# Patient Record
Sex: Female | Born: 1980
Health system: Southern US, Community
[De-identification: ages and names within clinical notes are randomized; demographics above are authoritative.]

## PROBLEM LIST (undated history)

## (undated) DIAGNOSIS — Q8789 Other specified congenital malformation syndromes, not elsewhere classified: Secondary | ICD-10-CM

## (undated) DIAGNOSIS — F1010 Alcohol abuse, uncomplicated: Secondary | ICD-10-CM

## (undated) DIAGNOSIS — F319 Bipolar disorder, unspecified: Secondary | ICD-10-CM

## (undated) DIAGNOSIS — F4310 Post-traumatic stress disorder, unspecified: Secondary | ICD-10-CM

## (undated) DIAGNOSIS — F419 Anxiety disorder, unspecified: Secondary | ICD-10-CM

## (undated) DIAGNOSIS — J341 Cyst and mucocele of nose and nasal sinus: Secondary | ICD-10-CM

## (undated) DIAGNOSIS — M797 Fibromyalgia: Secondary | ICD-10-CM

## (undated) DIAGNOSIS — F32A Depression, unspecified: Secondary | ICD-10-CM

## (undated) DIAGNOSIS — E739 Lactose intolerance, unspecified: Secondary | ICD-10-CM

## (undated) DIAGNOSIS — IMO0001 Reserved for inherently not codable concepts without codable children: Secondary | ICD-10-CM

## (undated) DIAGNOSIS — E785 Hyperlipidemia, unspecified: Secondary | ICD-10-CM

## (undated) DIAGNOSIS — M722 Plantar fascial fibromatosis: Secondary | ICD-10-CM

## (undated) DIAGNOSIS — E669 Obesity, unspecified: Secondary | ICD-10-CM

## (undated) DIAGNOSIS — N63 Unspecified lump in unspecified breast: Secondary | ICD-10-CM

## (undated) DIAGNOSIS — M2550 Pain in unspecified joint: Secondary | ICD-10-CM

## (undated) DIAGNOSIS — K219 Gastro-esophageal reflux disease without esophagitis: Secondary | ICD-10-CM

## (undated) DIAGNOSIS — B373 Candidiasis of vulva and vagina: Secondary | ICD-10-CM

## (undated) DIAGNOSIS — A498 Other bacterial infections of unspecified site: Secondary | ICD-10-CM

## (undated) DIAGNOSIS — C4491 Basal cell carcinoma of skin, unspecified: Secondary | ICD-10-CM

## (undated) DIAGNOSIS — R7303 Prediabetes: Secondary | ICD-10-CM

## (undated) DIAGNOSIS — R600 Localized edema: Secondary | ICD-10-CM

## (undated) DIAGNOSIS — D649 Anemia, unspecified: Secondary | ICD-10-CM

## (undated) DIAGNOSIS — F329 Major depressive disorder, single episode, unspecified: Secondary | ICD-10-CM

## (undated) DIAGNOSIS — M549 Dorsalgia, unspecified: Secondary | ICD-10-CM

## (undated) DIAGNOSIS — B3731 Acute candidiasis of vulva and vagina: Secondary | ICD-10-CM

## (undated) DIAGNOSIS — L03019 Cellulitis of unspecified finger: Secondary | ICD-10-CM

## (undated) DIAGNOSIS — IMO0002 Reserved for concepts with insufficient information to code with codable children: Secondary | ICD-10-CM

## (undated) DIAGNOSIS — D219 Benign neoplasm of connective and other soft tissue, unspecified: Secondary | ICD-10-CM

## (undated) DIAGNOSIS — Z9889 Other specified postprocedural states: Secondary | ICD-10-CM

## (undated) DIAGNOSIS — K829 Disease of gallbladder, unspecified: Secondary | ICD-10-CM

## (undated) HISTORY — DX: Alcohol abuse, uncomplicated: F10.10

## (undated) HISTORY — DX: Cyst and mucocele of nose and nasal sinus: J34.1

## (undated) HISTORY — DX: Pain in unspecified joint: M25.50

## (undated) HISTORY — DX: Other specified congenital malformation syndromes, not elsewhere classified: Q87.89

## (undated) HISTORY — DX: Lactose intolerance, unspecified: E73.9

## (undated) HISTORY — DX: Benign neoplasm of connective and other soft tissue, unspecified: D21.9

## (undated) HISTORY — DX: Acute candidiasis of vulva and vagina: B37.31

## (undated) HISTORY — DX: Post-traumatic stress disorder, unspecified: F43.10

## (undated) HISTORY — PX: NASAL SINUS SURGERY: SHX719

## (undated) HISTORY — DX: Cellulitis of unspecified finger: L03.019

## (undated) HISTORY — DX: Hyperlipidemia, unspecified: E78.5

## (undated) HISTORY — DX: Other bacterial infections of unspecified site: A49.8

## (undated) HISTORY — DX: Disease of gallbladder, unspecified: K82.9

## (undated) HISTORY — DX: Other specified postprocedural states: Z98.89

## (undated) HISTORY — DX: Bipolar disorder, unspecified: F31.9

## (undated) HISTORY — DX: Unspecified lump in unspecified breast: N63.0

## (undated) HISTORY — DX: Candidiasis of vulva and vagina: B37.3

## (undated) HISTORY — DX: Depression, unspecified: F32.A

## (undated) HISTORY — PX: CHOLECYSTECTOMY: SHX55

## (undated) HISTORY — DX: Reserved for inherently not codable concepts without codable children: IMO0001

## (undated) HISTORY — DX: Localized edema: R60.0

## (undated) HISTORY — DX: Obesity, unspecified: E66.9

## (undated) HISTORY — DX: Basal cell carcinoma of skin, unspecified: C44.91

## (undated) HISTORY — DX: Dorsalgia, unspecified: M54.9

## (undated) HISTORY — DX: Plantar fascial fibromatosis: M72.2

## (undated) HISTORY — PX: LEFT OOPHORECTOMY: SHX1961

---
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
## 2001-10-07 ENCOUNTER — Encounter: Admission: RE | Admit: 2001-10-07 | Discharge: 2001-10-07 | Payer: Self-pay | Admitting: Internal Medicine

## 2001-10-21 ENCOUNTER — Encounter: Admission: RE | Admit: 2001-10-21 | Discharge: 2001-10-21 | Payer: Self-pay | Admitting: *Deleted

## 2001-11-27 ENCOUNTER — Encounter: Admission: RE | Admit: 2001-11-27 | Discharge: 2001-11-27 | Payer: Self-pay | Admitting: Obstetrics and Gynecology

## 2001-12-03 ENCOUNTER — Ambulatory Visit (HOSPITAL_COMMUNITY): Admission: RE | Admit: 2001-12-03 | Discharge: 2001-12-03 | Payer: Self-pay | Admitting: Obstetrics and Gynecology

## 2002-02-11 ENCOUNTER — Encounter: Admission: RE | Admit: 2002-02-11 | Discharge: 2002-02-11 | Payer: Self-pay | Admitting: Internal Medicine

## 2003-02-04 ENCOUNTER — Encounter: Admission: RE | Admit: 2003-02-04 | Discharge: 2003-02-04 | Payer: Self-pay | Admitting: Internal Medicine

## 2003-02-04 ENCOUNTER — Encounter (INDEPENDENT_AMBULATORY_CARE_PROVIDER_SITE_OTHER): Payer: Self-pay | Admitting: *Deleted

## 2003-03-04 ENCOUNTER — Encounter: Admission: RE | Admit: 2003-03-04 | Discharge: 2003-03-04 | Payer: Self-pay | Admitting: Internal Medicine

## 2003-03-15 ENCOUNTER — Encounter: Admission: RE | Admit: 2003-03-15 | Discharge: 2003-03-15 | Payer: Self-pay | Admitting: Internal Medicine

## 2003-09-23 ENCOUNTER — Emergency Department (HOSPITAL_COMMUNITY): Admission: EM | Admit: 2003-09-23 | Discharge: 2003-09-23 | Payer: Self-pay | Admitting: Emergency Medicine

## 2003-11-25 ENCOUNTER — Emergency Department (HOSPITAL_COMMUNITY): Admission: EM | Admit: 2003-11-25 | Discharge: 2003-11-25 | Payer: Self-pay | Admitting: Family Medicine

## 2004-02-22 ENCOUNTER — Ambulatory Visit: Payer: Self-pay | Admitting: Internal Medicine

## 2004-08-29 ENCOUNTER — Ambulatory Visit: Payer: Self-pay | Admitting: Internal Medicine

## 2004-08-29 ENCOUNTER — Encounter (INDEPENDENT_AMBULATORY_CARE_PROVIDER_SITE_OTHER): Payer: Self-pay | Admitting: Internal Medicine

## 2004-08-29 LAB — CONVERTED CEMR LAB: Pap Smear: NORMAL

## 2004-09-19 ENCOUNTER — Ambulatory Visit: Payer: Self-pay | Admitting: Internal Medicine

## 2004-10-10 ENCOUNTER — Ambulatory Visit: Payer: Self-pay | Admitting: Internal Medicine

## 2005-02-28 ENCOUNTER — Emergency Department (HOSPITAL_COMMUNITY): Admission: EM | Admit: 2005-02-28 | Discharge: 2005-02-28 | Payer: Self-pay | Admitting: Emergency Medicine

## 2005-03-08 ENCOUNTER — Ambulatory Visit: Payer: Self-pay | Admitting: Internal Medicine

## 2005-05-31 ENCOUNTER — Ambulatory Visit: Payer: Self-pay | Admitting: Internal Medicine

## 2005-06-07 ENCOUNTER — Ambulatory Visit (HOSPITAL_COMMUNITY): Admission: RE | Admit: 2005-06-07 | Discharge: 2005-06-07 | Payer: Self-pay | Admitting: Internal Medicine

## 2005-12-20 ENCOUNTER — Ambulatory Visit: Payer: Self-pay | Admitting: Internal Medicine

## 2006-03-01 ENCOUNTER — Encounter (INDEPENDENT_AMBULATORY_CARE_PROVIDER_SITE_OTHER): Payer: Self-pay | Admitting: Internal Medicine

## 2006-03-01 DIAGNOSIS — Z9889 Other specified postprocedural states: Secondary | ICD-10-CM | POA: Insufficient documentation

## 2006-03-01 DIAGNOSIS — Q8789 Other specified congenital malformation syndromes, not elsewhere classified: Secondary | ICD-10-CM | POA: Insufficient documentation

## 2006-03-01 HISTORY — DX: Other specified postprocedural states: Z98.89

## 2006-03-04 ENCOUNTER — Ambulatory Visit: Payer: Self-pay | Admitting: Internal Medicine

## 2006-03-04 DIAGNOSIS — Z9089 Acquired absence of other organs: Secondary | ICD-10-CM | POA: Insufficient documentation

## 2006-03-04 DIAGNOSIS — K219 Gastro-esophageal reflux disease without esophagitis: Secondary | ICD-10-CM | POA: Insufficient documentation

## 2006-07-24 ENCOUNTER — Ambulatory Visit: Payer: Self-pay | Admitting: Internal Medicine

## 2006-07-24 ENCOUNTER — Encounter (INDEPENDENT_AMBULATORY_CARE_PROVIDER_SITE_OTHER): Payer: Self-pay | Admitting: Internal Medicine

## 2006-07-24 DIAGNOSIS — D4959 Neoplasm of unspecified behavior of other genitourinary organ: Secondary | ICD-10-CM | POA: Insufficient documentation

## 2006-07-24 DIAGNOSIS — J309 Allergic rhinitis, unspecified: Secondary | ICD-10-CM | POA: Insufficient documentation

## 2006-07-25 ENCOUNTER — Encounter (INDEPENDENT_AMBULATORY_CARE_PROVIDER_SITE_OTHER): Payer: Self-pay | Admitting: Internal Medicine

## 2006-07-25 LAB — CONVERTED CEMR LAB
Chlamydia, DNA Probe: NEGATIVE
GC Probe Amp, Genital: NEGATIVE

## 2006-08-07 LAB — CONVERTED CEMR LAB

## 2006-09-15 ENCOUNTER — Emergency Department (HOSPITAL_COMMUNITY): Admission: EM | Admit: 2006-09-15 | Discharge: 2006-09-15 | Payer: Self-pay | Admitting: Emergency Medicine

## 2006-09-18 ENCOUNTER — Encounter (INDEPENDENT_AMBULATORY_CARE_PROVIDER_SITE_OTHER): Payer: Self-pay | Admitting: Internal Medicine

## 2006-09-18 ENCOUNTER — Ambulatory Visit: Payer: Self-pay | Admitting: *Deleted

## 2006-10-02 ENCOUNTER — Ambulatory Visit: Payer: Self-pay | Admitting: Infectious Disease

## 2006-10-02 ENCOUNTER — Encounter (INDEPENDENT_AMBULATORY_CARE_PROVIDER_SITE_OTHER): Payer: Self-pay | Admitting: *Deleted

## 2006-10-02 DIAGNOSIS — N760 Acute vaginitis: Secondary | ICD-10-CM | POA: Insufficient documentation

## 2006-10-03 LAB — CONVERTED CEMR LAB
Candida species: POSITIVE — AB
Gardnerella vaginalis: NEGATIVE
Trichomonal Vaginitis: NEGATIVE

## 2007-03-17 ENCOUNTER — Ambulatory Visit: Payer: Self-pay | Admitting: Internal Medicine

## 2007-03-17 LAB — CONVERTED CEMR LAB
Bilirubin Urine: NEGATIVE
Blood in Urine, dipstick: NEGATIVE
Glucose, Urine, Semiquant: NEGATIVE
Ketones, urine, test strip: NEGATIVE
Nitrite: NEGATIVE
Protein, U semiquant: NEGATIVE
Specific Gravity, Urine: 1.01
Urobilinogen, UA: 0.2
WBC Urine, dipstick: NEGATIVE
pH: 7

## 2007-03-18 ENCOUNTER — Encounter (INDEPENDENT_AMBULATORY_CARE_PROVIDER_SITE_OTHER): Payer: Self-pay | Admitting: Internal Medicine

## 2007-03-18 LAB — CONVERTED CEMR LAB
Beta hcg, urine, semiquantitative: NEGATIVE
Chlamydia, Swab/Urine, PCR: NEGATIVE
GC Probe Amp, Urine: NEGATIVE

## 2007-03-19 LAB — CONVERTED CEMR LAB
Candida species: POSITIVE — AB
Gardnerella vaginalis: POSITIVE — AB
Trichomonal Vaginitis: NEGATIVE

## 2007-06-02 ENCOUNTER — Ambulatory Visit: Payer: Self-pay | Admitting: Internal Medicine

## 2007-06-04 ENCOUNTER — Telehealth: Payer: Self-pay | Admitting: *Deleted

## 2007-06-05 ENCOUNTER — Encounter: Admission: RE | Admit: 2007-06-05 | Discharge: 2007-06-05 | Payer: Self-pay | Admitting: Infectious Disease

## 2007-06-05 ENCOUNTER — Ambulatory Visit: Payer: Self-pay | Admitting: Infectious Diseases

## 2007-06-05 ENCOUNTER — Telehealth: Payer: Self-pay | Admitting: *Deleted

## 2007-06-05 ENCOUNTER — Observation Stay (HOSPITAL_COMMUNITY): Admission: AD | Admit: 2007-06-05 | Discharge: 2007-06-06 | Payer: Self-pay | Admitting: Infectious Diseases

## 2007-06-05 ENCOUNTER — Ambulatory Visit: Payer: Self-pay | Admitting: Infectious Disease

## 2007-07-23 ENCOUNTER — Encounter (INDEPENDENT_AMBULATORY_CARE_PROVIDER_SITE_OTHER): Payer: Self-pay | Admitting: *Deleted

## 2007-07-23 ENCOUNTER — Ambulatory Visit: Payer: Self-pay | Admitting: *Deleted

## 2007-07-23 DIAGNOSIS — B0229 Other postherpetic nervous system involvement: Secondary | ICD-10-CM | POA: Insufficient documentation

## 2007-07-30 ENCOUNTER — Encounter (INDEPENDENT_AMBULATORY_CARE_PROVIDER_SITE_OTHER): Payer: Self-pay | Admitting: *Deleted

## 2007-07-30 ENCOUNTER — Ambulatory Visit: Payer: Self-pay | Admitting: Internal Medicine

## 2007-07-30 LAB — CONVERTED CEMR LAB

## 2007-07-31 LAB — CONVERTED CEMR LAB
Candida species: POSITIVE — AB
Gardnerella vaginalis: POSITIVE — AB
Trichomonal Vaginitis: NEGATIVE

## 2007-11-27 ENCOUNTER — Telehealth: Payer: Self-pay | Admitting: Infectious Disease

## 2008-01-20 ENCOUNTER — Ambulatory Visit: Payer: Self-pay | Admitting: Internal Medicine

## 2008-01-20 ENCOUNTER — Encounter (INDEPENDENT_AMBULATORY_CARE_PROVIDER_SITE_OTHER): Payer: Self-pay | Admitting: Internal Medicine

## 2008-01-20 LAB — CONVERTED CEMR LAB

## 2008-01-21 LAB — CONVERTED CEMR LAB
Candida species: POSITIVE — AB
Chlamydia, DNA Probe: NEGATIVE
GC Probe Amp, Genital: NEGATIVE
Gardnerella vaginalis: POSITIVE — AB
Trichomonal Vaginitis: NEGATIVE

## 2008-01-23 ENCOUNTER — Ambulatory Visit: Payer: Self-pay | Admitting: Internal Medicine

## 2008-01-23 ENCOUNTER — Encounter (INDEPENDENT_AMBULATORY_CARE_PROVIDER_SITE_OTHER): Payer: Self-pay | Admitting: Internal Medicine

## 2008-01-23 LAB — CONVERTED CEMR LAB
Bilirubin Urine: NEGATIVE
Bilirubin Urine: NEGATIVE
Blood in Urine, dipstick: NEGATIVE
Glucose, Urine, Semiquant: NEGATIVE
Hemoglobin, Urine: NEGATIVE
Leukocytes, UA: NEGATIVE
Nitrite: NEGATIVE
Nitrite: NEGATIVE
Protein, U semiquant: 30
Protein, ur: NEGATIVE mg/dL
Specific Gravity, Urine: >=1.03
Specific Gravity, Urine: 1.025 (ref 1.005–1.03)
Urine Glucose: NEGATIVE mg/dL
Urobilinogen, UA: 1
Urobilinogen, UA: 1 (ref 0.0–1.0)
pH: 6
pH: 6.5 (ref 5.0–8.0)

## 2008-01-24 ENCOUNTER — Encounter (INDEPENDENT_AMBULATORY_CARE_PROVIDER_SITE_OTHER): Payer: Self-pay | Admitting: Internal Medicine

## 2008-02-25 ENCOUNTER — Ambulatory Visit: Payer: Self-pay | Admitting: Internal Medicine

## 2008-02-25 ENCOUNTER — Encounter: Payer: Self-pay | Admitting: Internal Medicine

## 2008-02-25 LAB — CONVERTED CEMR LAB
ALT: 13 units/L (ref 0–35)
AST: 15 units/L (ref 0–37)
Albumin: 3.8 g/dL (ref 3.5–5.2)
Alkaline Phosphatase: 39 units/L (ref 39–117)
BUN: 12 mg/dL (ref 6–23)
Basophils Absolute: 0 10*3/uL (ref 0.0–0.1)
Basophils Relative: 0 % (ref 0–1)
CO2: 24 meq/L (ref 19–32)
Calcium: 8.8 mg/dL (ref 8.4–10.5)
Chloride: 105 meq/L (ref 96–112)
Creatinine, Ser: 0.73 mg/dL (ref 0.40–1.20)
Eosinophils Absolute: 0.1 10*3/uL (ref 0.0–0.7)
Eosinophils Relative: 1 % (ref 0–5)
Glucose, Bld: 95 mg/dL (ref 70–99)
HCT: 39.8 % (ref 36.0–46.0)
Hemoglobin: 12.9 g/dL (ref 12.0–15.0)
Hgb A1c MFr Bld: 5.7 %
Lymphocytes Relative: 48 % — ABNORMAL HIGH (ref 12–46)
Lymphs Abs: 2.2 10*3/uL (ref 0.7–4.0)
MCHC: 32.4 g/dL (ref 30.0–36.0)
MCV: 85.8 fL (ref 78.0–100.0)
Monocytes Absolute: 0.5 10*3/uL (ref 0.1–1.0)
Monocytes Relative: 11 % (ref 3–12)
Neutro Abs: 1.8 10*3/uL (ref 1.7–7.7)
Neutrophils Relative %: 40 % — ABNORMAL LOW (ref 43–77)
Platelets: 203 10*3/uL (ref 150–400)
Potassium: 3.7 meq/L (ref 3.5–5.3)
RBC: 4.64 M/uL (ref 3.87–5.11)
RDW: 14.2 % (ref 11.5–15.5)
Sodium: 139 meq/L (ref 135–145)
Total Bilirubin: 0.4 mg/dL (ref 0.3–1.2)
Total Protein: 6.6 g/dL (ref 6.0–8.3)
WBC: 4.5 10*3/uL (ref 4.0–10.5)

## 2008-02-26 ENCOUNTER — Encounter: Payer: Self-pay | Admitting: Internal Medicine

## 2008-02-26 DIAGNOSIS — M5126 Other intervertebral disc displacement, lumbar region: Secondary | ICD-10-CM | POA: Insufficient documentation

## 2008-02-26 LAB — CONVERTED CEMR LAB
Candida species: POSITIVE — AB
Gardnerella vaginalis: NEGATIVE
Trichomonal Vaginitis: NEGATIVE

## 2008-04-02 ENCOUNTER — Encounter (INDEPENDENT_AMBULATORY_CARE_PROVIDER_SITE_OTHER): Payer: Self-pay | Admitting: Internal Medicine

## 2008-04-02 ENCOUNTER — Ambulatory Visit: Payer: Self-pay | Admitting: Internal Medicine

## 2008-04-02 LAB — CONVERTED CEMR LAB
Candida species: NEGATIVE
Chlamydia, DNA Probe: NEGATIVE
GC Probe Amp, Genital: NEGATIVE
Gardnerella vaginalis: NEGATIVE
Hemoglobin, Urine: NEGATIVE
Ketones, ur: NEGATIVE mg/dL
Leukocytes, UA: NEGATIVE
Nitrite: NEGATIVE
Protein, ur: NEGATIVE mg/dL
Specific Gravity, Urine: 1.021 (ref 1.005–1.03)
Trichomonal Vaginitis: NEGATIVE
Urine Glucose: NEGATIVE mg/dL
Urobilinogen, UA: 1 (ref 0.0–1.0)
pH: 6.5 (ref 5.0–8.0)

## 2008-04-06 ENCOUNTER — Ambulatory Visit (HOSPITAL_COMMUNITY): Admission: RE | Admit: 2008-04-06 | Discharge: 2008-04-06 | Payer: Self-pay | Admitting: Internal Medicine

## 2008-04-08 ENCOUNTER — Ambulatory Visit: Payer: Self-pay | Admitting: Obstetrics & Gynecology

## 2008-04-09 ENCOUNTER — Encounter (INDEPENDENT_AMBULATORY_CARE_PROVIDER_SITE_OTHER): Payer: Self-pay | Admitting: Internal Medicine

## 2008-05-05 ENCOUNTER — Ambulatory Visit: Payer: Self-pay | Admitting: Internal Medicine

## 2008-05-18 ENCOUNTER — Ambulatory Visit (HOSPITAL_COMMUNITY): Admission: RE | Admit: 2008-05-18 | Discharge: 2008-05-18 | Payer: Self-pay | Admitting: Family Medicine

## 2008-05-31 ENCOUNTER — Encounter (INDEPENDENT_AMBULATORY_CARE_PROVIDER_SITE_OTHER): Payer: Self-pay | Admitting: Internal Medicine

## 2008-06-02 ENCOUNTER — Ambulatory Visit: Payer: Self-pay | Admitting: Obstetrics and Gynecology

## 2008-06-09 ENCOUNTER — Ambulatory Visit: Payer: Self-pay | Admitting: Internal Medicine

## 2008-07-12 ENCOUNTER — Ambulatory Visit: Payer: Self-pay | Admitting: Internal Medicine

## 2008-07-12 ENCOUNTER — Encounter: Admission: RE | Admit: 2008-07-12 | Discharge: 2008-07-12 | Payer: Self-pay

## 2008-07-12 DIAGNOSIS — N644 Mastodynia: Secondary | ICD-10-CM | POA: Insufficient documentation

## 2008-07-12 LAB — CONVERTED CEMR LAB: Beta hcg, urine, semiquantitative: NEGATIVE

## 2008-07-27 ENCOUNTER — Ambulatory Visit: Payer: Self-pay | Admitting: Internal Medicine

## 2008-08-30 ENCOUNTER — Encounter (INDEPENDENT_AMBULATORY_CARE_PROVIDER_SITE_OTHER): Payer: Self-pay | Admitting: Internal Medicine

## 2008-08-30 ENCOUNTER — Ambulatory Visit: Payer: Self-pay | Admitting: Internal Medicine

## 2008-08-30 DIAGNOSIS — R609 Edema, unspecified: Secondary | ICD-10-CM | POA: Insufficient documentation

## 2008-08-30 DIAGNOSIS — G5600 Carpal tunnel syndrome, unspecified upper limb: Secondary | ICD-10-CM | POA: Insufficient documentation

## 2008-09-01 LAB — CONVERTED CEMR LAB
ALT: 9 units/L (ref 0–35)
AST: 12 units/L (ref 0–37)
Albumin: 4.1 g/dL (ref 3.5–5.2)
Alkaline Phosphatase: 41 units/L (ref 39–117)
BUN: 13 mg/dL (ref 6–23)
CO2: 25 meq/L (ref 19–32)
Calcium: 9.3 mg/dL (ref 8.4–10.5)
Chloride: 104 meq/L (ref 96–112)
Creatinine, Ser: 0.8 mg/dL (ref 0.40–1.20)
Glucose, Bld: 81 mg/dL (ref 70–99)
HCT: 41.5 % (ref 36.0–46.0)
Hemoglobin: 14 g/dL (ref 12.0–15.0)
MCHC: 33.7 g/dL (ref 30.0–36.0)
MCV: 84.3 fL (ref 78.0–100.0)
Platelets: 227 10*3/uL (ref 150–400)
Potassium: 4.5 meq/L (ref 3.5–5.3)
RBC: 4.92 M/uL (ref 3.87–5.11)
RDW: 14.5 % (ref 11.5–15.5)
Sodium: 139 meq/L (ref 135–145)
TSH: 1.161 microintl units/mL (ref 0.350–4.500)
Total Bilirubin: 0.6 mg/dL (ref 0.3–1.2)
Total Protein: 6.6 g/dL (ref 6.0–8.3)
WBC: 5.2 10*3/uL (ref 4.0–10.5)

## 2009-02-24 ENCOUNTER — Ambulatory Visit: Payer: Self-pay | Admitting: Internal Medicine

## 2009-03-25 ENCOUNTER — Ambulatory Visit: Payer: Self-pay | Admitting: Internal Medicine

## 2009-03-25 DIAGNOSIS — L0292 Furuncle, unspecified: Secondary | ICD-10-CM | POA: Insufficient documentation

## 2009-03-25 DIAGNOSIS — L0293 Carbuncle, unspecified: Secondary | ICD-10-CM

## 2009-03-25 LAB — CONVERTED CEMR LAB
Bilirubin Urine: NEGATIVE
Blood in Urine, dipstick: NEGATIVE
Glucose, Urine, Semiquant: NEGATIVE
Ketones, urine, test strip: NEGATIVE
Nitrite: NEGATIVE
Protein, U semiquant: NEGATIVE
Specific Gravity, Urine: 1.015
Urobilinogen, UA: 0.2
WBC Urine, dipstick: NEGATIVE
pH: 7.5

## 2009-04-22 ENCOUNTER — Telehealth (INDEPENDENT_AMBULATORY_CARE_PROVIDER_SITE_OTHER): Payer: Self-pay | Admitting: Internal Medicine

## 2009-05-24 ENCOUNTER — Ambulatory Visit: Payer: Self-pay | Admitting: Internal Medicine

## 2009-05-24 DIAGNOSIS — M25569 Pain in unspecified knee: Secondary | ICD-10-CM | POA: Insufficient documentation

## 2009-05-24 DIAGNOSIS — M722 Plantar fascial fibromatosis: Secondary | ICD-10-CM

## 2009-05-24 HISTORY — DX: Plantar fascial fibromatosis: M72.2

## 2009-06-01 ENCOUNTER — Ambulatory Visit: Payer: Self-pay | Admitting: Internal Medicine

## 2009-06-01 DIAGNOSIS — J019 Acute sinusitis, unspecified: Secondary | ICD-10-CM

## 2009-06-01 DIAGNOSIS — B9689 Other specified bacterial agents as the cause of diseases classified elsewhere: Secondary | ICD-10-CM | POA: Insufficient documentation

## 2009-06-01 LAB — CONVERTED CEMR LAB
Candida species: POSITIVE — AB
Gardnerella vaginalis: POSITIVE — AB
Sed Rate: 6 mm/hr (ref 0–22)

## 2009-06-08 ENCOUNTER — Ambulatory Visit: Payer: Self-pay | Admitting: Internal Medicine

## 2009-06-15 ENCOUNTER — Telehealth: Payer: Self-pay | Admitting: *Deleted

## 2009-07-04 ENCOUNTER — Ambulatory Visit: Payer: Self-pay | Admitting: Infectious Disease

## 2009-07-04 ENCOUNTER — Ambulatory Visit (HOSPITAL_COMMUNITY): Admission: RE | Admit: 2009-07-04 | Discharge: 2009-07-04 | Payer: Self-pay | Admitting: Infectious Disease

## 2009-07-04 DIAGNOSIS — M79609 Pain in unspecified limb: Secondary | ICD-10-CM | POA: Insufficient documentation

## 2009-07-06 ENCOUNTER — Telehealth (INDEPENDENT_AMBULATORY_CARE_PROVIDER_SITE_OTHER): Payer: Self-pay | Admitting: Internal Medicine

## 2009-09-01 ENCOUNTER — Ambulatory Visit: Payer: Self-pay | Admitting: Internal Medicine

## 2009-09-21 ENCOUNTER — Encounter: Payer: Self-pay | Admitting: Internal Medicine

## 2009-09-21 ENCOUNTER — Ambulatory Visit: Payer: Self-pay | Admitting: Internal Medicine

## 2009-09-21 DIAGNOSIS — J029 Acute pharyngitis, unspecified: Secondary | ICD-10-CM | POA: Insufficient documentation

## 2009-09-21 LAB — CONVERTED CEMR LAB
Bilirubin Urine: NEGATIVE
Chlamydia, DNA Probe: NEGATIVE
GC Probe Amp, Genital: NEGATIVE
Glucose, Urine, Semiquant: NEGATIVE
Ketones, urine, test strip: NEGATIVE
Nitrite: NEGATIVE
Pap Smear: NEGATIVE
Protein, U semiquant: NEGATIVE
Specific Gravity, Urine: >=1.03
Streptococcus, Group A Screen (Direct): NEGATIVE
Urobilinogen, UA: 0.2
WBC Urine, dipstick: NEGATIVE
pH: 5

## 2009-09-22 ENCOUNTER — Encounter: Payer: Self-pay | Admitting: Internal Medicine

## 2009-09-22 LAB — CONVERTED CEMR LAB
Candida species: NEGATIVE
Gardnerella vaginalis: POSITIVE — AB

## 2009-09-27 ENCOUNTER — Emergency Department (HOSPITAL_COMMUNITY): Admission: EM | Admit: 2009-09-27 | Discharge: 2009-09-27 | Payer: Self-pay | Admitting: Emergency Medicine

## 2009-09-30 LAB — HM PAP SMEAR

## 2009-12-14 ENCOUNTER — Ambulatory Visit: Payer: Self-pay | Admitting: Internal Medicine

## 2009-12-14 DIAGNOSIS — B353 Tinea pedis: Secondary | ICD-10-CM | POA: Insufficient documentation

## 2009-12-28 ENCOUNTER — Ambulatory Visit: Payer: Self-pay | Admitting: Internal Medicine

## 2009-12-28 DIAGNOSIS — R61 Generalized hyperhidrosis: Secondary | ICD-10-CM | POA: Insufficient documentation

## 2009-12-29 ENCOUNTER — Ambulatory Visit (HOSPITAL_COMMUNITY)
Admission: RE | Admit: 2009-12-29 | Discharge: 2009-12-29 | Payer: Self-pay | Source: Home / Self Care | Admitting: Ophthalmology

## 2010-01-02 ENCOUNTER — Telehealth: Payer: Self-pay | Admitting: Internal Medicine

## 2010-01-17 ENCOUNTER — Ambulatory Visit: Payer: Self-pay | Admitting: Internal Medicine

## 2010-01-17 DIAGNOSIS — E669 Obesity, unspecified: Secondary | ICD-10-CM | POA: Insufficient documentation

## 2010-01-18 ENCOUNTER — Telehealth (INDEPENDENT_AMBULATORY_CARE_PROVIDER_SITE_OTHER): Payer: Self-pay | Admitting: *Deleted

## 2010-01-28 ENCOUNTER — Emergency Department (HOSPITAL_COMMUNITY)
Admission: EM | Admit: 2010-01-28 | Discharge: 2010-01-28 | Payer: Self-pay | Source: Home / Self Care | Admitting: Emergency Medicine

## 2010-01-30 ENCOUNTER — Telehealth: Payer: Self-pay | Admitting: Internal Medicine

## 2010-01-31 ENCOUNTER — Ambulatory Visit: Payer: Self-pay | Admitting: Internal Medicine

## 2010-02-04 ENCOUNTER — Encounter: Payer: Self-pay | Admitting: *Deleted

## 2010-02-14 ENCOUNTER — Telehealth: Payer: Self-pay | Admitting: Internal Medicine

## 2010-03-09 ENCOUNTER — Ambulatory Visit: Admission: RE | Admit: 2010-03-09 | Discharge: 2010-03-09 | Payer: Self-pay | Source: Home / Self Care

## 2010-03-09 DIAGNOSIS — R350 Frequency of micturition: Secondary | ICD-10-CM | POA: Insufficient documentation

## 2010-03-09 DIAGNOSIS — T148XXA Other injury of unspecified body region, initial encounter: Secondary | ICD-10-CM | POA: Insufficient documentation

## 2010-03-09 LAB — CONVERTED CEMR LAB
ALT: <8 units/L (ref 0–35)
AST: 10 units/L (ref 0–37)
Albumin: 4.2 g/dL (ref 3.5–5.2)
Alkaline Phosphatase: 49 units/L (ref 39–117)
BUN: 11 mg/dL (ref 6–23)
Bilirubin Urine: NEGATIVE
CO2: 28 meq/L (ref 19–32)
Calcium: 9.4 mg/dL (ref 8.4–10.5)
Casts: NONE SEEN /lpf
Chloride: 106 meq/L (ref 96–112)
Cholesterol: 236 mg/dL — ABNORMAL HIGH (ref 0–200)
Creatinine, Ser: 0.79 mg/dL (ref 0.40–1.20)
Crystals: NONE SEEN
Glucose, Bld: 80 mg/dL (ref 70–99)
HCT: 42.3 % (ref 36.0–46.0)
HDL: 75 mg/dL (ref >39)
HIV: NONREACTIVE
Hemoglobin: 13.6 g/dL (ref 12.0–15.0)
Ketones, ur: NEGATIVE mg/dL
LDL Cholesterol: 136 mg/dL — ABNORMAL HIGH (ref 0–99)
Leukocytes, UA: NEGATIVE
MCHC: 32.2 g/dL (ref 30.0–36.0)
MCV: 87 fL (ref 78.0–100.0)
Nitrite: NEGATIVE
Platelets: 261 10*3/uL (ref 150–400)
Potassium: 3.8 meq/L (ref 3.5–5.3)
Protein, ur: NEGATIVE mg/dL
RBC: 4.86 M/uL (ref 3.87–5.11)
RDW: 15.7 % — ABNORMAL HIGH (ref 11.5–15.5)
Sodium: 140 meq/L (ref 135–145)
Specific Gravity, Urine: 1.022 (ref 1.005–1.03)
Total Bilirubin: 0.3 mg/dL (ref 0.3–1.2)
Total CHOL/HDL Ratio: 3.1
Total Protein: 6.9 g/dL (ref 6.0–8.3)
Triglycerides: 125 mg/dL (ref <150)
Urine Glucose: NEGATIVE mg/dL
Urobilinogen, UA: 1 (ref 0.0–1.0)
VLDL: 25 mg/dL (ref 0–40)
WBC: 7.4 10*3/uL (ref 4.0–10.5)
pH: 6.5 (ref 5.0–8.0)

## 2010-03-23 NOTE — Miscellaneous (Signed)
  Clinical Lists Changes  Medications: Added new medication of FLUCONAZOLE 150 MG TABS (FLUCONAZOLE) Take first pill on day 1 and second and third pill each three days apart. - Signed Rx of FLUCONAZOLE 150 MG TABS (FLUCONAZOLE) Take first pill on day 1 and second and third pill each three days apart.;  #3 x 0;  Signed;  Entered by: Blondell Reveal MD;  Authorized by: Blondell Reveal MD;  Method used: Electronically to [facility] [phone]*, [address] [place], [place]  [address], Ph: [phone], Fax: [phone]    Prescriptions: FLUCONAZOLE 150 MG TABS (FLUCONAZOLE) Take first pill on day 1 and second and third pill each three days apart.  #3 x 0   Entered and Authorized by:   Blondell Reveal MD   Signed by:   Blondell Reveal MD on 02/26/2008   Method used:   Electronically to        [facility] [phone]* (retail)       [address]       [place], [place]  [address]       Ph: [phone]       Fax: [phone]   RxID:   [id number]    Impression & Recommendations:  Problem # 1:  VAGINAL DISCHARGE (ICD-623.5) Growing candida species in wet prep. will treat her  fluconazole 3 doses. Will consider this as a severe candida vaginitis and treat with 150 mg of fluconazole with 3 subsequential doses with 72 hours apart. Will not consider this as a recurrent candidiasis as we do not have a documented evidence of that, and hence will not treat with induction and maintainance therapy over 6 months. If she gets another candida vaginal infection on wet prep, i guess she needs a induction+ maintainance therapy with fluconazole. Discussed medication use and symptom relief.   Complete Medication List: 1)  Skelaxin 800 Mg Tabs (Metaxalone) .... Take one tablet every 12 hours for back pain. 2)  Vicodin 5-500 Mg Tabs (Hydrocodone-acetaminophen) .... Take one tablet every 8hrs as needed for back pain. 3)  Acid Controller Max St 20 Mg Tabs (Famotidine) .... Take 1 pill once daily at bed time  4)  Fluconazole 150 Mg Tabs (Fluconazole) .... Take first pill on day 1 and second and third pill each three days apart.

## 2010-03-23 NOTE — Assessment & Plan Note (Signed)
Summary: checkup [mkj]   Vital Signs:  Patient profile:   30 year old female Height:      69 inches (175.26 cm) Weight:      254.3 pounds (112.36 kg) BMI:     36.64 Temp:     98.7 degrees F (37.06 degrees C) oral Pulse rate:   77 / minute BP sitting:   109 / 71  (right arm) Cuff size:   large  Vitals Entered By: Theotis Barrio NT II (June 09, 2008 9:00 AM) CC: LOWER BACK PAIN   Is Patient Diabetic? No Pain Assessment Patient in pain? yes     Location: back Intensity:     3 Type: sharp Onset of pain   2008 Nutritional Status BMI of > 30 = obese  Have you ever been in a relationship where you felt threatened, hurt or afraid?No   Does patient need assistance? Functional Status Self care Ambulation Normal Comments LOWER BACK PAI N   Primary Care Provider:  [name] MD  CC:  LOWER BACK PAIN  .  History of Present Illness: 66 yof with hx of herniated disc in 2008 compressing left nerve roots with chronic back pain and some leg weakness.  She has been followed in ortho/spinal clinic and working with PT.  She has been working with a Clinical research associate to keep her job and benifits.  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 She has filled out a scholarship application at the [facility] [employer] and is trying to get into water classes.  Here to discuss GERD symptoms and right knee pain.  She has been having substernal burning pain aftermeals and has no relief with famotidine.  Pain is occuring all the time.  No emesis.  She reports that she has been eating while laying down a lot of the time, eats large meals and lots of acidic foods.  Her right knee has been giving her sharp pain that lasts from seconds to minutes.  It usually happens when she is just getting up from sitting, or when she pivots.  She has had no weakness of the knee.  No numbness of the foot.  Preventive Screening-Counseling & Management     Alcohol drinks/day: <1      Alcohol type: beer, liquor     Smoking Status: never     Passive Smoke Exposure: yes     Does Patient Exercise: no  Current Medications (verified): 1)  Methocarbamol 500 Mg Tabs (Methocarbamol) .... Take 1 Tablet By Mouth Three Times A Day As Needed 2)  Acid Controller Max St 20 Mg Tabs (Famotidine) .... Take 1 Pill Once Daily At Bed Time 3)  Etodolac 400 Mg Tabs (Etodolac) .... Two Times A Day With Food 4)  Tramadol Hcl 50 Mg Tabs (Tramadol Hcl) .... 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    Single    Never Smoked  Review of Systems       per hpi  Physical Exam  General:  alert and overweight-appearing.   Mouth:  good dentition and pharynx pink and moist.   Lungs:  normal respiratory effort and normal breath sounds.  Heart:  normal rate, regular rhythm, and no murmur.   Abdomen:  soft, non-tender, and normal bowel sounds.   Msk:  there is pain with lateral and medial stress on the right knee. there is no weakness of the joint, no crepitus. there is no tender point on palpation. Pulses:  2+ Extremities:  no edema Neurologic:  alert & oriented X3, cranial nerves II-XII intact, strength normal in all extremities, and gait normal.   Psych:  Oriented X3, memory intact for recent and remote, normally interactive, and good eye contact.     Impression & Recommendations:  Problem # 1:  GERD (ICD-530.81) Gave patient hand out on GERD.  Discussed smaller meals, staying upright for 30 min after eating, avoiding acidic foods.  She will try this for one week.  If symptoms persist, than she will call and I will write for ppi.  Her updated medication list for this problem includes:     Acid Controller Max St 20 Mg Tabs (Famotidine) .Marland Kitchen... Take 1 pill once daily at bed time  Labs Reviewed: Hgb: 12.9 (02/25/2008)   Hct: 39.8 (02/25/2008)  Problem # 2:  KNEE PAIN, RIGHT (ICD-719.46) No joint weakness, effusion, or warmth.  Some pain with lateral/medial strain.  Rec knee strengthening exercises.   Her updated medication list for this problem includes:    Methocarbamol 500 Mg Tabs (Methocarbamol) .Marland Kitchen... Take 1 tablet by mouth three times a day as needed    Etodolac 400 Mg Tabs (Etodolac) .Marland Kitchen..Marland Kitchen Two times a day with food    Tramadol Hcl 50 Mg Tabs (Tramadol hcl) .Marland Kitchen... Take 1 -2 tablets by mouth three times a day as needed for back pain  Problem # 3:  Preventive Health Care (ICD-V70.0) she needs a pap in july, will make appointment.  Complete Medication List: 1)  Methocarbamol 500 Mg Tabs (Methocarbamol) .... Take 1 tablet by mouth three times a day as needed 2)  Acid Controller Max St 20 Mg Tabs (Famotidine) .... Take 1 pill once daily at bed time 3)  Etodolac 400 Mg Tabs (Etodolac) .... Two times a day with food 4)  Tramadol Hcl 50 Mg Tabs (Tramadol hcl) .... Take 1 -2 tablets by mouth three times a day as needed for back pain  Patient Instructions: 1)  Please make appointment in July for pap smear. 2)  Please follow the lifestyle changes recomended in the reflux hand out.  If these do not work for you in one week, call the clinic and let me know.

## 2010-03-23 NOTE — Progress Notes (Signed)
Summary: Referral request  Phone Note Call from Patient   Caller: Patient Summary of Call: Call from pt said  that she now has Medicaid and would like a referral to [facility] for her Plantar Fascitis.  Wants a referral to Dr. Turner Daniels for her back and [facility] for her Carpal Tunnel. Initial call taken by: Angelina Ok RN,  January 30, 2010 8:46 AM  Follow-up for Phone Call        Per patient request, will make those referrals to podiatry and orthopedics.  Follow-up by: Whitney Post MD,  February 02, 2010 9:25 AM

## 2010-03-23 NOTE — Progress Notes (Signed)
Summary: refill/hla  Phone Note Refill Request Message from:  Patient on November 27, 2007 10:06 AM  Refills Requested: Medication #1:  diflucan 150mg  by mouth 1 time dose pt was seen june 09 and given script w/ 4 refills, has used all 4 and continues to have problem  Initial call taken by: Marin Roberts RN,  November 27, 2007 10:07 AM  Follow-up for Phone Call        She may also have BV.  She should go ahead and try the fluconazole 150mg . She should be worked in next week. I would definitely test her for HIV in case she is having unexplained recurrentyeast infections. She should try to refrain from sexual activity until this calms down. She should have repeat vaginal exam with wet prep, gc and chlamydia culture as welll as hiv test at next visit. Follow-up by: Acey Lav MD,  November 27, 2007 10:16 AM    New/Updated Medications: FLUCONAZOLE 150 MG TABS (FLUCONAZOLE) Take one tablet for yeast infection   Prescriptions: FLUCONAZOLE 150 MG TABS (FLUCONAZOLE) Take one tablet for yeast infection  #1 x 5   Entered and Authorized by:   Acey Lav MD   Signed by:   Paulette Blanch Dam MD on 11/27/2007   Method used:   Electronically to        [facility]* (retail)       [address]       [place], [place]  [address]       Ph: [phone]       Fax: [phone]   RxID:   [id number]

## 2010-03-23 NOTE — Assessment & Plan Note (Signed)
Summary: acute-injuried toe/cfb   Vital Signs:  Patient profile:   30 year old female Height:      69 inches Weight:      270.4 pounds BMI:     40.08 Temp:     99.0 degrees F oral Pulse rate:   75 / minute BP sitting:   96 / 64  (right arm)  Vitals Entered By: Filomena Jungling NT II (Jul 04, 2009 3:25 PM) CC: Letf big toe  bruised Is Patient Diabetic? No Pain Assessment Patient in pain? no      Nutritional Status BMI of > 30 = obese  Have you ever been in a relationship where you felt threatened, hurt or afraid?No   Does patient need assistance? Functional Status Self care Ambulation Normal   Primary Care Provider:  [name] MD  CC:  Letf big toe  bruised.  History of Present Illness: 30yof with pmh outlined in the chart presents with an acute toe injury.  She reports that she was playing around in the house and stubbed her toe very hard several days ago.  No pain in the ankle.  There was some swelling but this has resoved, now she just has a lot of pain with movement of the PIP in the great toe.  Preventive Screening-Counseling & Management  Alcohol-Tobacco     Alcohol drinks/day: <1     Alcohol type: beer, liquor     Smoking Status: never     Passive Smoke Exposure: yes  Caffeine-Diet-Exercise     Does Patient Exercise: no  Current Medications (verified): 1)  Methocarbamol 500 Mg Tabs (Methocarbamol) .... Take 1 Tablet By Mouth Three Times A Day As Needed 2)  Acid Controller Max St 20 Mg Tabs (Famotidine) .... Take 1 Pill Once Daily At Bed Time 3)  Tramadol Hcl 50 Mg Tabs (Tramadol Hcl) .... Take 1 -2 Tablets By Mouth Three Times A Day As Needed For Back Pain  Allergies: No Known Drug Allergies  Review of Systems       per hpi  Physical Exam  General:  alert and overweight-appearing.   Lungs:  normal respiratory effort and normal breath sounds.   Heart:  normal rate, regular rhythm, and no murmur.   Msk:  L great toe is very painful to touch  especially with manipulation of the PIP joint.  There is no swelling, skin break, or instability of the joint. no involvement of other toes or ankle. Pulses:  2+ Extremities:  no edema Neurologic:  alert & oriented X3, cranial nerves II-XII intact, and strength normal in all extremities.   Psych:  Oriented X3, memory intact for recent and remote, normally interactive, and good eye contact.     Impression & Recommendations:  Problem # 1:  TOE PAIN (ICD-729.5)  Acute injury to left great toe a week ago.  Had a lot of swelling and bruising which is resolving.  Still unable to bend the toe at all.  Will get an xray to be sure joint is not dislocated or no major breaks (does not feel ike it on exam).  Otherwise advise buddy taping to next toe, put cotton ball in between.  Wear ridgid soled shoes- can use a hard sole shoe  Orders: Radiology other (Radiology Other)  Complete Medication List: 1)  Methocarbamol 500 Mg Tabs (Methocarbamol) .... Take 1 tablet by mouth three times a day as needed 2)  Acid Controller Max St 20 Mg Tabs (Famotidine) .... Take 1 pill once daily  at bed time 3)  Tramadol Hcl 50 Mg Tabs (Tramadol hcl) .... Take 1 -2 tablets by mouth three times a day as needed for back pain  Patient Instructions: 1)  You will have an xray of your toe to be sure it is not broken. 2)  You should use a soft cloth tape to tape the toe to the next one over for stability.  Put a cotton ball between the toes so they do not rub.

## 2010-03-23 NOTE — Assessment & Plan Note (Signed)
Summary: ACUTE-SHARP PAINS IN STOMACHS([facility])/CFB   Vital Signs:  Patient Profile:   30 Years Old Jordan [name] Height:     69 inches (175.26 cm) Weight:      237.9 pounds (108.14 kg) BMI:     35.26 Temp:     98.0 degrees F (36.67 degrees C) oral Pulse rate:   80 / minute BP sitting:   103 / 67  (right arm)  Pt. in pain?   yes    Location:   back    Intensity:   3    Type:       pinching  Vitals Entered By: Stanton Kidney Ditzler RN (April 02, 2008 1:54 PM)              Is Patient Diabetic? No Nutritional Status BMI of > 30 = obese Nutritional Status Detail appetite good  Have you ever been in a relationship where you felt threatened, hurt or afraid?denies   Does patient need assistance? Functional Status Self care Ambulation Normal     PCP:  [name] MD  Chief Complaint:  Since 1/10 - burning and stabbing feeling lower abd. Problems with constipation. Sharp pain in left hand x 2 months..  History of Present Illness: 30 year old woman with chr back pain, GERD, h/o recurrent vaginitis/UTI.  In here today with problem of pain on lower abdomen. Pain started middle of last month. Off and on, no precipitating factor. Lasts 2-15 min. No problem with urine or stool. no NVD. No problem with periods. Last period 25-26 of Jan, always has regular period. Pain comes off and on throughout day and night. Keeps her from sleeping well. Feels like burning and stabbing. Goes away on its own. Had similar pain when she had cyst in her ovary. Not sexually active currently. No problem with bowel or bladder.     Prior Medications Reviewed Using: Medication Bottles  Updated Prior Medication List: SKELAXIN 800 MG TABS (METAXALONE) Take one tablet every 12 hours for back pain. ACID CONTROLLER MAX ST 20 MG TABS (FAMOTIDINE) Take 1 pill once daily at bed time ETODOLAC 400 MG TABS (ETODOLAC) two times a day with food  Current Allergies (reviewed today): No known allergies   Past Medical History:     Reviewed history from 07/30/2007 and no changes required:       Basal cell carcinoma, recurrent in left maxillary sinus (dx'ed with basal cell nouveau -> hereditary syndrome) has had 5 surgeries, last one in 2003.       Left ovarian fibroma       Sinus cyst       Recurrent vaginal candidiasis       Gardnerella + in 08/08       Hx of herpetic whitlow requiring I+D (was bitten by the autistic child that cares for).  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       Single       Never Smoked   Risk Factors: Tobacco use:  never Passive smoke exposure:  yes Drug use:  no HIV high-risk behavior:  no Alcohol use:  no Exercise:  no Seatbelt use:  100 %  PAP Smear History:    Date of Last PAP Smear:  07/30/2007   Review of Systems      See HPI       As per HPI.    Physical Exam  General:     Not in distress HEENT:      NCAT, PERRL, no icterus/pallor Mouth:     MMM, no erythema or exudates.   Lungs:     clear to auscultation.   Cardiovascular:     RRR, no murmur, normal peripheral pulses Abdomen:     soft, non-tender, and normal bowel sounds, no palpable mass or organomegaly Pelvic exam:     normal introitus, no abnormal discharge, whitish lesions seen in the mouth of cervix, no tenderness or bleeding. No abnormal mass palpable on manual exam.   Extremities:     no cyanosis, clubbing or edema.  Neurologic:     no focal deficit. Skin:    normal turgor, color. Psych:     appropriate.     Impression & Recommendations:  Problem # 1:  ABDOMINAL PAIN, LOWER (ICD-789.09)  Likely etiology PID/vaginitis, UTI, ovarian tumor. Will check urine and pelvic samples for infection. Will get abdominal and pelvic ultrasound too. Will treat if anything positive on the exams. I am concerned about the lesions on her cervix.  I have asked the patient to keep up her appointment with ob/gyn. Will follow.   The following medications were removed from the medication list:    Vicodin 5-500 Mg Tabs (Hydrocodone-acetaminophen) .Marland Kitchen... Take one tablet every 8hrs as needed for back pain.  Her updated medication list for this problem includes:    Skelaxin 800 Mg Tabs (Metaxalone) .Marland Kitchen... Take one tablet every 12 hours for back pain.    Etodolac 400 Mg Tabs (Etodolac) .Marland Kitchen..Marland Kitchen Two times a day with food   Complete Medication List: 1)  Skelaxin 800 Mg Tabs (Metaxalone) .... Take one tablet every 12 hours for back pain. 2)  Acid Controller Max St 20 Mg Tabs (Famotidine) .... Take 1 pill once daily at bed time 3)  Etodolac 400 Mg Tabs (Etodolac) .... Two times a day with food   Patient Instructions: 1)  Please schedule a follow-up appointment in 1 month. 2)  Please keep up your appointment with the Ob/gyn.  3)  We will check the lab results and let you know if anything wrong.   Appended Document: Lab Order    Lab Visit   Laboratory Results   Urine Tests  Date/Time Received: April 02, 2008 2:36 PM. Date/Time Reported: [name]  April 02, 2008 2:36 PM    Urine HCG: negative Comments: urine specific gravity  1.024    [name]  April 02, 2008 2:36 PM     Orders Today:

## 2010-03-23 NOTE — Assessment & Plan Note (Signed)
Summary: EST-F/U VISIT/CH   Vital Signs:  Patient profile:   30 year old female Height:      69 inches (175.26 cm) Weight:      265.0 pounds (120.45 kg) BMI:     39.28 Temp:     97.6 degrees F (36.44 degrees C) oral Pulse rate:   74 / minute BP sitting:   107 / 64  (left arm)  Vitals Entered By: Stanton Kidney Ditzler RN (January 17, 2010 8:31 AM) Is Patient Diabetic? No Pain Assessment Patient in pain? yes     Location: left leg Intensity: 3 Type: dull Onset of pain  since last visit Nutritional Status BMI of > 30 = obese Nutritional Status Detail appetite good  Have you ever been in a relationship where you felt threatened, hurt or afraid?denies   Does patient need assistance? Functional Status Self care Ambulation Normal Comments Friend with pt. FU - left leg - sl better.   Primary Care Provider:  Whitney Post MD   History of Present Illness: Jordan [name] is a 30yo W who presents for follow-up of back pain. She was seen by Dr. Cathey Endow 3 weeks ago. Because she had a reported history of ovarian cancer (records showed that she in fact had benign fibromas removed) and an acute onset of back pain, she was sent for CT of abdomen/pelvis, which was essentially normal. She was started on naproxen in addition to tramadol for pain control. She also met with a nutritionist, who started her on a 2000 calorie, low fat diet. She is very excited about losing weight and getting healthier. She has been adhering to the diet fairly strictly (though she admits to struggling at Thanksgiving) and is losing weight. She reports that her back pain is significantly improved, and she is happy with her pain control with tramadol and now uses naproxen only when she feels she needs it. She still has some pain that radiates into her L leg and somewhat restricts her activity; however, she says that she can now walk for 15-20 minutes without being limited by pain, which is a significant improvement. She denies  neurological symptoms, numbness, weakness, or tingling and seems overall quite pleased with her progress.   Depression History:      The patient denies a depressed mood most of the day and a diminished interest in her usual daily activities.         Preventive Screening-Counseling & Management  Alcohol-Tobacco     Alcohol drinks/day: <1     Alcohol type: beer, liquor     Smoking Status: never     Passive Smoke Exposure: yes  Caffeine-Diet-Exercise     Does Patient Exercise: no  Current Medications (verified): 1)  Acid Controller Max St 20 Mg Tabs (Famotidine) .... Take 1 Pill Once Daily At Bed Time 2)  Ultram 50 Mg Tabs (Tramadol Hcl) .... Take One Tab By Mouth Ever 6 Hours As Needed For Your Back Pain. 3)  Naproxen 500 Mg Tabs (Naproxen) .... Take 1 Tablet By Mouth Two Times A Day 4)  Clotrimazole 1 % Crea (Clotrimazole) .... Apply Two Times A Day For 4 Weeks  Allergies: No Known Drug Allergies  Past History:  Past Medical History: Last updated: 07/30/2007 Basal cell carcinoma, recurrent in left maxillary sinus (dx'ed with basal cell nouveau -> hereditary syndrome) has had 5 surgeries, last one in 2003. Left ovarian fibroma Sinus cyst Recurrent vaginal candidiasis Gardnerella + in 08/08 Hx of herpetic whitlow requiring I+D (was bitten by  the autistic child that cares for).  Past Surgical History: Last updated: 07/24/2006 Oophorectomy, left Cholecystectomy Sinus surgery (x2 at age 52 and 28)  Family History: Last updated: 12/28/2009 Uncle stomach cancer aunts x 2 breast ca grandfather unknown CA DM in grandmother, aunt and two uncles.   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 Single Never Smoked  Review of Systems      See HPI General:  Denies fatigue, malaise, and weakness. CV:  Denies chest  pain or discomfort and weight gain. Resp:  Denies cough and shortness of breath. GI:  Denies abdominal pain, change in bowel habits, and dark tarry stools. GU:  Denies dysuria and incontinence. MS:  Complains of low back pain; denies muscle weakness. Derm:  Denies rash. Neuro:  Denies disturbances in coordination, numbness, tingling, and weakness.  Physical Exam  General:  alert, cooperative to examination, and overweight-appearing.   Head:  normocephalic and atraumatic.   Eyes:  vision grossly intact, pupils equal, pupils round, and pupils reactive to light.   Mouth:  pharynx pink and moist.   Neck:  supple and no masses.   Lungs:  normal respiratory effort, normal breath sounds, no crackles, and no wheezes.   Heart:  normal rate, regular rhythm, no murmur, no gallop, and no rub.   Abdomen:  soft, non-tender, and no distention.   Msk:  normal ROM. Mild tenderness to palpation of lumbosacral spine and paraspinal tenderness.  Extremities:  No edema.  Neurologic:  alert & oriented X3, cranial nerves grossly intact, sensation normal, strength normal in all extremities, and gait normal.   Skin:  turgor normal.   Cervical Nodes:  no anterior cervical adenopathy and no posterior cervical adenopathy.   Psych:  Oriented X3, memory intact for recent and remote, normally interactive, good eye contact, not anxious appearing, and not depressed appearing.     Impression & Recommendations:  Problem # 1:  BACK PAIN, CHRONIC (ICD-724.5) Pain improved since last visit and patient satisfied with current pain management. CT scan showed no significant abnormalities. Encouraged continued weight loss and gently increased physical activity.   Her updated medication list for this problem includes:    Ultram 50 Mg Tabs (Tramadol hcl) .Marland Kitchen... Take one tab by mouth ever 6 hours as needed for your back pain.    Naproxen 500 Mg Tabs (Naproxen) .Marland Kitchen... Take 1 tablet by mouth two times a day  Problem # 2:  OBESITY  (ICD-278.00) Patient has been seeing a nutritionist, adhering to a low calorie and low fat diet, and keeping a food log. She has lost 11 pounds in the last three weeks since her last visit; she is both pleased with her progress and reports feeling improved health overall (decreased back pain and reflux symptoms). She is also able to walk more because she is less limited by pain. She was congratulated on her excellent weight loss progress so far and her current efforts were reinforced. We also discussed strategies to maintain her diet during the holidays. She will return for follow-up in 1-2 months, at which time we will check on her weight loss progress.   Problem # 3:  GERD (ICD-530.81) She had previously beeen taking famotidine twice daily for reflux symptoms, but lately has found that she needs it only once per day. We discussed how her reflux symptoms may improve with  continued weight loss.   Her updated medication list for this problem includes:    Acid Controller Max St 20 Mg Tabs (Famotidine) .Marland Kitchen... Take 1 pill once daily at bed time  Complete Medication List: 1)  Acid Controller Max St 20 Mg Tabs (Famotidine) .... Take 1 pill once daily at bed time 2)  Ultram 50 Mg Tabs (Tramadol hcl) .... Take one tab by mouth ever 6 hours as needed for your back pain. 3)  Naproxen 500 Mg Tabs (Naproxen) .... Take 1 tablet by mouth two times a day 4)  Clotrimazole 1 % Crea (Clotrimazole) .... Apply two times a day for 4 weeks   Patient Instructions: 1)  Please schedule a follow-up appointment in 1-2 months. We will continue to follow your weight loss at that visit.  2)  Continue with your current diet modifications and staying active. You lost 11 pounds since your last visit! You're doing great. Keep up the good work!   Orders Added: 1)  Est. Patient Level III [09811]     Prevention & Chronic Care Immunizations   Influenza vaccine: Not documented   Influenza vaccine deferral: Deferred   (06/08/2009)    Tetanus booster: Not documented   Td booster deferral: Deferred  (06/08/2009)    Pneumococcal vaccine: Not documented  Other Screening   Pap smear: NEGATIVE FOR INTRAEPITHELIAL LESIONS OR MALIGNANCY.  (09/21/2009)   Pap smear due: 08/2007   Smoking status: never  (01/17/2010)      Resource handout printed.

## 2010-03-23 NOTE — Assessment & Plan Note (Signed)
Summary: HAVING TROUBLE WALKING/ SB.   Vital Signs:  Patient profile:   30 year old female Height:      69 inches (175.26 cm) Weight:      271.4 pounds (120.91 kg) BMI:     39.42 Temp:     97.1 degrees F (36.17 degrees C) oral Pulse rate:   70 / minute BP sitting:   104 / 67  (left arm) Cuff size:   regular  Vitals Entered By: Theotis Barrio NT II (December 14, 2009 10:36 AM) CC: LEFT LEG PAIN # 7  /  LOWER BACK PAIN  / HAVING TROUBLE WALKING DUE TO THE PAIN Is Patient Diabetic? No Pain Assessment Patient in pain? yes     Location: LEFT LEG/BACK Intensity:        7 Type: SHARP/BURN Onset of pain  LAST WEEK THURSDAY  / TAKING OTC Nutritional Status BMI of > 30 = obese  Have you ever been in a relationship where you felt threatened, hurt or afraid?No   Does patient need assistance? Functional Status Self care Ambulation Normal   Primary Care Provider:  [name] MD  CC:  LEFT LEG PAIN # 7  /  LOWER BACK PAIN  / HAVING TROUBLE WALKING DUE TO THE PAIN.  History of Present Illness: 30 yr old woman with pmhx as described below comes to the clinic complaining that on threw her back out. Patient has history of chronic back pain which she was seeing Dr. Noel Gerold Spine specialist but her released her on 2010. Patient reports that since thursday she has been having new weakness left lower extremity and tingling. Denies bowel or bladder incontinence, saddle anesthesia.   Preventive Screening-Counseling & Management  Alcohol-Tobacco     Alcohol drinks/day: <1     Alcohol type: beer, liquor     Smoking Status: never     Passive Smoke Exposure: yes  Caffeine-Diet-Exercise     Does Patient Exercise: no  Problems Prior to Update: 1)  Sore Throat  (ICD-462) 2)  Toe Pain  (ICD-729.5) 3)  Sinusitis, Acute  (ICD-461.9) 4)  Knee Pain, Bilateral  (ICD-719.46) 5)  Plantar Fasciitis, Bilateral  (ICD-728.71) 6)  Furuncle  (ICD-680.9) 7)  Health Maintenance Exam  (ICD-V70.0) 8)   Carpal Tunnel Syndrome  (ICD-354.0) 9)  Leg Edema, Bilateral  (ICD-782.3) 10)  Mastalgia  (ICD-611.71) 11)  Knee Pain, Left  (ICD-719.46) 12)  Back Pain, Chronic  (ICD-724.5) 13)  Postherpetic Neuralgia  (ICD-053.19) 14)  Vaginitis  (ICD-616.10) 15)  Gynecological Examination, Routine  (ICD-V72.31) 16)  Lesion, Cervix  (ICD-236.3) 17)  Allergic Rhinitis  (ICD-477.9) 18)  Cholecystectomy, Hx of  (ICD-V45.79) 19)  Gerd  (ICD-530.81) 20)  Ovarian Cystectomy, Hx of  (ICD-V45.89) 21)  Carcinoma, Basal Cell  (ICD-173.9)  Medications Prior to Update: 1)  Methocarbamol 500 Mg Tabs (Methocarbamol) .... Take 1 Tablet By Mouth Three Times A Day As Needed 2)  Acid Controller Max St 20 Mg Tabs (Famotidine) .... Take 1 Pill Once Daily At Bed Time 3)  Fluconazole 150 Mg Tabs (Fluconazole) .... Take 1 Tablet By Mouth Now, If Symptoms Do Not Resolve in 3 Days, Take Another Tablet 4)  Metronidazole 500 Mg Tabs (Metronidazole) .Marland Kitchen.. 1 Tablet By Mouth Two Times A Day For 7 Days  Current Medications (verified): 1)  Methocarbamol 500 Mg Tabs (Methocarbamol) .... Take 1 Tablet By Mouth Three Times A Day As Needed 2)  Acid Controller Max St 20 Mg Tabs (Famotidine) .... Take 1 Pill Once  Daily At Bed Time 3)  Ultram 50 Mg Tabs (Tramadol Hcl)  Allergies: No Known Drug Allergies  Past History:  Past Medical History: Last updated: 07/30/2007 Basal cell carcinoma, recurrent in left maxillary sinus (dx'ed with basal cell nouveau -> hereditary syndrome) has had 5 surgeries, last one in 2003. Left ovarian fibroma Sinus cyst Recurrent vaginal candidiasis Gardnerella + in 08/08 Hx of herpetic whitlow requiring I+D (was bitten by the autistic child that cares for).  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 Single Never Smoked  Risk Factors: Alcohol Use: <1 (12/14/2009) Exercise: no (12/14/2009)  Risk Factors: Smoking Status: never (12/14/2009) Passive Smoke Exposure: yes (12/14/2009)  Social History: Reviewed history from 06/09/2008 and no changes required. 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 Single Never Smoked  Review of Systems  The patient denies fever, chest pain, dyspnea on exertion, headaches, hemoptysis, abdominal pain, melena, hematochezia, and hematuria.    Physical Exam  General:  mild distress due to back pain Mouth:  MMM Neck:  supple.   Lungs:  normal respiratory effort, no intercostal retractions, no accessory muscle use, normal breath sounds, no crackles, and no wheezes.   Heart:  normal rate, regular rhythm, no murmur, no gallop, and no rub.   Abdomen:  soft, normal bowel sounds, no distention, no masses, no guarding, and no rigidity.   Msk:  Back: ttp lumbar area. No point tenderness Negative straight leg raise Extremities:  no edema Neurologic:  alert & oriented X3, strength normal in all extremities, sensation intact to light touch, and ataxic gait.     Impression & Recommendations:  Problem # 1:  BACK PAIN, CHRONIC (ICD-724.5) Acute on chronic back pain. Patient was instructed not to remain at bed rest as recovery would be slower. Instructed to return to her usual activities of daily living. Treat symptomatically with hot/cold treatment, Naproxen and tramadol. No signs or symptoms suggestive of cauda equina syndrome. Will reassess on follow up. Toradol injection given.  Her updated medication list for this problem includes:    Methocarbamol 500 Mg Tabs (Methocarbamol) .Marland Kitchen... Take 1 tablet by mouth three times a day as needed    Ultram 50 Mg Tabs  (Tramadol hcl)    Naproxen 500 Mg Tabs (Naproxen) .Marland Kitchen... Take 1 tablet by mouth two times a day  Orders: Ketorolac-Toradol 15mg  (M5784)  Problem # 2:  TINEA PEDIS (ICD-110.4) Start patient on treatment and follow up.  The following medications were removed from the medication list:    Fluconazole 150 Mg Tabs (Fluconazole) .Marland Kitchen... Take 1 tablet by mouth now, if symptoms do not resolve in 3 days, take another tablet Her updated medication list for this problem includes:    Clotrimazole 1 % Crea (Clotrimazole) .Marland Kitchen... Apply two times a day for 4 weeks  Complete Medication List: 1)  Methocarbamol 500 Mg Tabs (Methocarbamol) .... Take 1 tablet by mouth three times a day as needed 2)  Acid Controller Max St 20 Mg Tabs (Famotidine) .... Take 1 pill once daily at bed time 3)  Ultram 50 Mg Tabs (Tramadol hcl) 4)  Naproxen 500 Mg Tabs (Naproxen) .Marland KitchenMarland KitchenMarland Kitchen  Take 1 tablet by mouth two times a day 5)  Clotrimazole 1 % Crea (Clotrimazole) .... Apply two times a day for 4 weeks  Patient Instructions: 1)  Please schedule a follow-up appointment in 2 weeks. 2)  Do not take ibuprofen if you start taking Naproxen. 3)  Take all medication as directed. Prescriptions: METHOCARBAMOL 500 MG TABS (METHOCARBAMOL) Take 1 tablet by mouth three times a day as needed  #21 x 0   Entered and Authorized by:   Laren Everts MD   Signed by:   Laren Everts MD on 12/14/2009   Method used:   Electronically to        [facility] [address] (retail)       [address], [address]       [place], [place]  [address]       Ph: [phone]       Fax: [phone]   RxID:   [id number] CLOTRIMAZOLE 1 % CREA (CLOTRIMAZOLE) Apply two times a day for 4 weeks  #15g x 1   Entered and Authorized by:   Laren Everts MD   Signed by:   Laren Everts MD on 12/14/2009   Method used:   Electronically to        [facility] [address] (retail)       [address], [address]       [place], [place]  [address]       Ph: [phone]       Fax: [phone]   RxID:   [id number] NAPROXEN 500 MG TABS (NAPROXEN) Take 1 tablet by mouth two times a day  #30 x 1   Entered and Authorized by:   Laren Everts MD   Signed by:   Laren Everts MD on 12/14/2009   Method used:   Electronically to        [facility] [address] (retail)       [address], [address]       [place], [place]  [address]       Ph: [phone]       Fax: [phone]   RxID:   [id number]    Medication Administration  Injection # 1:    Medication: Ketorolac-Toradol 15mg     Diagnosis: BACK PAIN, CHRONIC (ICD-724.5)    Route: IM    Site: RUOQ gluteus    Exp Date: 12/21/2010    Lot #: [id number]    Mfr: hospira    Comments: Pt given toradol 60mg  total.Kaye Collene Schlichter Duncan Dull)  December 14, 2009 11:55 AM     Patient tolerated injection without complications    Given by: Cynda Familia Duncan Dull) (December 14, 2009 11:57 AM)  Orders Added: 1)  Ketorolac-Toradol 15mg  [J1885] 2)  Est. Patient Level III [51025]     Prevention & Chronic Care Immunizations   Influenza vaccine: Not documented   Influenza vaccine deferral: Deferred  (06/08/2009)    Tetanus booster: Not documented   Td booster deferral: Deferred  (06/08/2009)    Pneumococcal vaccine: Not documented  Other Screening   Pap smear: NEGATIVE FOR INTRAEPITHELIAL LESIONS OR MALIGNANCY.  (09/21/2009)   Pap smear due: 08/2007   Smoking status: never  (12/14/2009)   Nursing Instructions: Toradol 60mg  IM X 1

## 2010-03-23 NOTE — Assessment & Plan Note (Signed)
Summary: DISCUSS ISSUES W/FINGER([facility])/DS   Vital Signs:  Patient Profile:   30 Years Old Female Height:     69 inches (175.26 cm) Weight:      215.0 pounds (97.73 kg) BMI:     31.86 Temp:     98.1 degrees F (36.72 degrees C) oral Pulse rate:   67 / minute BP sitting:   93 / 636  (right arm)  Pt. in pain?   no  Vitals Entered By: Krystal Eaton Duncan Dull) (July 23, 2007 8:48 AM)              Is Patient Diabetic? No Nutritional Status BMI of > 30 = obese  Have you ever been in a relationship where you felt threatened, hurt or afraid?No   Does patient need assistance? Functional Status Self care Ambulation Normal     PCP:  Yvonne Kendall, MD  Chief Complaint:  middle finger right hand painful.  History of Present Illness: 30yo BF here for pain in right third finger occuring between DIP and PIP joint.  Pt had mod swelling and pain in 4/09 in same finger at distal tip and was admitted to hospital.  Finger was I and D by Dr Amanda Pea and cultures obtained.  All were negative except herpes culture.  Since d/c 4/09, pain and swelling had resolved.  Pain began to recur approx 1 wk ago.  Pain occasional when using finger and usually resolves spontaneously after a few minutes.  Pain described as sharp.  No active swelling.  Frequency of pain episodes increasing therefore pt here in clinic.  No pain currently.    Prior Medications Reviewed Using: Patient Recall  Prior Medication List:  CENTRUM  TABS (MULTIPLE VITAMINS-MINERALS) Take 1 tablet by mouth once a day LORATADINE 10 MG TABS (LORATADINE) Take 1 tablet by mouth once a day as needed for seasonal allergies FLONASE 50 MCG/ACT SUSP (FLUTICASONE PROPIONATE) Place 2 sprays in each nostril daily FLUCONAZOLE 150 MG  TABS (FLUCONAZOLE) Take 1 tablet by mouth once   Updated Prior Medication List: CENTRUM  TABS (MULTIPLE VITAMINS-MINERALS) Take 1 tablet by mouth once a day   Current Allergies (reviewed today): No known allergies     Social History:    Reviewed history from 07/24/2006 and no changes required:       Occupation: student       Single       Never Smoked   Risk Factors: Tobacco use:  never Passive smoke exposure:  yes Drug use:  no HIV high-risk behavior:  no Alcohol use:  yes    Type:  beer, liquor Exercise:  no Seatbelt use:  100 %  PAP Smear History:    Date of Last PAP Smear:  08/07/2006   Review of Systems  The patient denies anorexia, fever, weight loss, weight gain, vision loss, decreased hearing, hoarseness, chest pain, syncope, dyspnea on exertion, peripheral edema, prolonged cough, headaches, abdominal pain, and melena.     Physical Exam  General:     alert, well-developed, well-nourished, and well-hydrated.   Head:     normocephalic and atraumatic.   Eyes:     vision grossly intact.   Ears:     R ear normal and L ear normal.   Nose:     no external deformity, no external erythema, and no nasal discharge.   Mouth:     good dentition.   Lungs:     normal respiratory effort, normal breath sounds, and no wheezes.   Heart:  normal rate, regular rhythm, and no murmur.   Msk:     Right 3rd finger normal in appearance.  No swelling or erythema, normal ROM, no TTP. Extremities:     See MSK Neurologic:     nonfocal, normal gait    Impression & Recommendations:  Problem # 1:  POSTHERPETIC NEURALGIA (ICD-053.19) Likely resolving post herpatic neuralgia.  Not uncommon to have painful episodes for 6 months.  No swelling, erythema or tenderness presently.  Will symptomatically treat with tylenol #3.  Other options if pain continues include tricyclic antidepressants.  Or 2nd line gabapentin, topical capsaicin.  No signs systemic infection.  Complete Medication List: 1)  Centrum Tabs (Multiple vitamins-minerals) .... Take 1 tablet by mouth once a day 2)  Tylenol/codeine #3 300-30 Mg Tabs (Acetaminophen-codeine) .... Take one every 4-6 hours as needed for pain    Patient Instructions: 1)  Pain likely from resolving herpes infection 2)  Take tylenol #3 as needed for pain   Prescriptions: TYLENOL/CODEINE #3 300-30 MG  TABS (ACETAMINOPHEN-CODEINE) Take one every 4-6 hours as needed for pain  #20 x 0   Entered and Authorized by:   Tacey Ruiz MD   Signed by:   Tacey Ruiz MD on 07/23/2007   Method used:   Print then Give to Patient   RxID:   [id number]  ]

## 2010-03-23 NOTE — Assessment & Plan Note (Signed)
Summary: 2WK F/U/[facility]/VS   Vital Signs:  Patient profile:   30 year old female Height:      69 inches (175.26 cm) Weight:      260.5 pounds (118.41 kg) BMI:     38.61 Temp:     98.3 degrees F Pulse rate:   72 / minute BP sitting:   94 / 64  (left arm) BP standing:   96 / 67  (left arm) Cuff size:   large  Vitals Entered By: Dorie Rank RN (July 27, 2008 8:50 AM) CC: wants physical for job - goes [facility] help with [facility]- hurt back on the job August 2008 and then reinjured 10/09 - released from MD on 05/13/08 Is Patient Diabetic? No Pain Assessment Patient in pain? yes     Location: back Intensity: 3 Type: dull Onset of pain  see above Nutritional Status BMI of > 30 = obese  Have you ever been in a relationship where you felt threatened, hurt or afraid?No   Does patient need assistance? Functional Status Self care Ambulation Normal   Primary Care Provider:  [name] MD  CC:  wants physical for job - goes to [facility] to help with ADL- hurt back on the job August 2008 and then reinjured 10/09 - released from MD on 05/13/08.  History of Present Illness:  Pt is a 30 year old woman with PMH as noted in note who presents to clinic for two week follow up for right sided breast pain and swelling.  Pt reports that the swelling is still there but some better and that the pain is also better.  Today she reports that she has intermitent, stabbing right breast pain that starts centrally and then radiates outward.  She says the pain only lasts for a few seconds and is then gone.  She has not had to take additional pain medications for this problem.  She denies discharge, change in color of breast, skin dimpling, or worsening of size or pain.  Pt also reports that her ultrasound was completely normal with recommendation to have mammogram screening at the age of 13.  Pt reports that she would like to have some medical info sent to her work but unfortunately did not bring the form to be sent in.  She is going to bring it at her next office visit in one month at which time she is due for her Pap smear. Pt reports that her chroic back pain is stable and she continues to due her exercises.    Preventive Screening-Counseling & Management  Alcohol-Tobacco     Smoking Status: never  Problems Prior to Update: 1)  Mastalgia  (ICD-611.71) 2)  Knee Pain, Right  (ICD-719.46) 3)  Candidiasis, Vulvovaginal, Recurrent  (ICD-112.1) 4)  Ovarian Cyst, Right  (ICD-620.2) 5)  Abdominal Pain, Lower  (ICD-789.09) 6)  Back Pain, Chronic  (ICD-724.5) 7)  Vaginal Discharge  (ICD-623.5) 8)  Urinary Incontinence  (ICD-788.30) 9)  Postherpetic Neuralgia  (ICD-053.19) 10)  Abscess, Finger  (ICD-681.00) 11)  Vaginitis  (ICD-616.10) 12)  Gynecological Examination, Routine  (ICD-V72.31) 13)  Lesion, Cervix  (ICD-236.3) 14)  Allergic Rhinitis  (ICD-477.9) 15)  Disturbance, Visual Nos  (ICD-368.9) 16)  Tendinitis, Shoulder  (ICD-726.10) 17)  Cholecystectomy, Hx of  (ICD-V45.79) 18)  Gerd  (ICD-530.81) 19)  Facial Pain  (ICD-784.0) 20)  Sinus Pain  (ICD-784.0)  21)  Ovarian Cystectomy, Hx of  (ICD-V45.89) 22)  Carcinoma, Basal Cell  (ICD-173.9)  Medications  Prior to Update: 1)  Methocarbamol 500 Mg Tabs (Methocarbamol) .... Take 1 Tablet By Mouth Three Times A Day As Needed 2)  Acid Controller Max St 20 Mg Tabs (Famotidine) .... Take 1 Pill Once Daily At Bed Time 3)  Etodolac 400 Mg Tabs (Etodolac) .... Two Times A Day With Food 4)  Tramadol Hcl 50 Mg Tabs (Tramadol Hcl) .... Take 1 -2 Tablets By Mouth Three Times A Day As Needed For Back Pain  Current Medications (verified): 1)  Methocarbamol 500 Mg Tabs (Methocarbamol) .... Take 1 Tablet By Mouth Three Times A Day As Needed 2)  Acid Controller Max St 20 Mg Tabs (Famotidine) .... Take 1 Pill Once Daily At Bed Time 3)  Etodolac 400 Mg Tabs (Etodolac) .... Two Times A Day With Food 4)  Tramadol Hcl 50 Mg Tabs (Tramadol Hcl) .... Take 1 -2 Tablets By Mouth Three Times A Day As Needed For Back Pain  Allergies (verified): No Known Drug Allergies  Past History:  Past Medical History: Last updated: 07/30/2007 Basal cell carcinoma, recurrent in left maxillary sinus (dx'ed with basal cell nouveau -> hereditary syndrome) has had 5 surgeries, last one in 2003. Left ovarian fibroma Sinus cyst Recurrent vaginal candidiasis Gardnerella + in 08/08 Hx of herpetic whitlow requiring I+D (was bitten by the autistic child that cares for).  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 Single Never Smoked  Risk Factors: Alcohol Use: <1 (07/12/2008) Exercise: no (07/12/2008)  Risk Factors: Smoking Status: never (07/27/2008) Passive Smoke Exposure: yes (07/12/2008)  Review of Systems       SEE HPI  Physical Exam   General:  alert, well-developed, well-nourished, well-hydrated, and overweight-appearing.   Head:  normocephalic and atraumatic.   Neck:  supple.   Lungs:  normal respiratory effort, no intercostal retractions, no accessory muscle use, normal breath sounds, no crackles, and no wheezes.   Heart:  normal rate, regular rhythm, no murmur, and no gallop.   Abdomen:  soft, non-tender, and normal bowel sounds.   Extremities:  No clubbing, cyanosis, edema, or deformity noted with normal full range of motion of all joints.   Neurologic:  alert & oriented X3.   Skin:  color normal, no rashes, no suspicious lesions, and no edema.   Psych:  Oriented X3, normally interactive, good eye contact, not anxious appearing, not depressed appearing, and not agitated.     Impression & Recommendations:  Problem # 1:  MASTALGIA (ICD-611.71) Pt reports pain is still present but is getting better.  Ultrasound was normal.  Will continue to monitor at this point.    Problem # 2:  BACK PAIN, CHRONIC (ICD-724.5) I have encouraged pt to continue her exercises and to let us know if her pain progresses.  Her updated medication list for this problem includes:    Methocarbamol 500 Mg Tabs (Methocarbamol) .Marland Kitchen... Take 1 tablet by mouth three times a day as needed    Etodolac 400 Mg Tabs (Etodolac) .Marland Kitchen..Marland Kitchen Two times a day with food    Tramadol Hcl 50 Mg Tabs (Tramadol hcl) .Marland Kitchen... Take 1 -2 tablets by mouth three times a day as needed for back pain  Problem # 3:  Preventive Health Care (ICD-V70.0) Pt to come back next month  for her Pap smear.    Complete Medication List: 1)  Methocarbamol 500 Mg Tabs (Methocarbamol) .... Take 1 tablet by mouth three times a day as needed 2)  Acid Controller Max St 20 Mg Tabs (Famotidine) .... Take 1 pill once daily at bed time 3)  Etodolac 400 Mg Tabs (Etodolac) .... Two times a day with food  4)  Tramadol Hcl 50 Mg Tabs (Tramadol hcl) .... Take 1 -2 tablets by mouth three times a day as needed for back pain  Patient Instructions: 1)  Please schedule a follow-up appointment in 1 month to get your Pap smear done.  Make sure you bring in the form for your work in regards to your physical and if there is anything in particular that they need. 2)  If you have increasing problems with your breast let us know.  In particular if you start having discharge, change in color, dimpling of the skin, or notice nodules.

## 2010-03-23 NOTE — Assessment & Plan Note (Signed)
Summary: f/u R middle finger, finger completely swollen/pcp-[facility]   Vital Signs:  Patient Profile:   30 Years Old Female Height:     69 inches (175.26 cm) Weight:      216.04 pounds (98.20 kg) BMI:     32.02 Temp:     98.6 degrees F (37.00 degrees C) oral Pulse rate:   73 / minute BP sitting:   99 / 62  (right arm)  Pt. in pain?   yes    Location:   finger, hand right side    Intensity:   3    Type:       aching  Vitals Entered By: Angelina Ok RN (June 05, 2007 9:38 AM)              Is Patient Diabetic? No Nutritional Status BMI of > 30 = obese  Have you ever been in a relationship where you felt threatened, hurt or afraid?No   Does patient need assistance? Functional Status Self care Ambulation Normal     PCP:  Yvonne Kendall, MD  Chief Complaint:  Follow finger pain.  History of Present Illness: 30 y/o with PMH of basal cell carcinoma who comes for a follow up on a nodule of her 3rd phalane DIP. On friday there was just a little dot and not apinful to palpation.  She was told to put warm (no antibiotic) and if it did not get better to come to the [facility]. She relates this has gotten worst over the past 24hr now she can close her hand b/ is so painful.  The swelling has progressively gotten worst. Her hand is swollen and some redness around the area. Now there seems to papule.  She relates her wrist on the extensor side and and fingers 2 & 4th are swollen. She has had decrease appetite. no fever chills, N/V. She relatesno discharge, no itching onher genitals    Prior Medication List:  CENTRUM  TABS (MULTIPLE VITAMINS-MINERALS) Take 1 tablet by mouth once a day LORATADINE 10 MG TABS (LORATADINE) Take 1 tablet by mouth once a day as needed for seasonal allergies FLONASE 50 MCG/ACT SUSP (FLUTICASONE PROPIONATE) Place 2 sprays in each nostril daily FLUCONAZOLE 150 MG  TABS (FLUCONAZOLE) Take 1 tablet by mouth once   Current Allergies: No known allergies      Risk Factors: Tobacco use:  never Passive smoke exposure:  yes Drug use:  no HIV high-risk behavior:  no Alcohol use:  yes    Type:  beer, liquor Exercise:  no Seatbelt use:  100 %  PAP Smear History:    Date of Last PAP Smear:  08/07/2006   Review of Systems  The patient denies fever, weight loss, weight gain, vision loss, chest pain, syncope, and dyspnea on exhertion.     Physical Exam  General:     alert, well-developed, well-nourished, and well-hydrated.   Msk:     Right hand 3rd finger swollen wih decrease range of motion. there i a papules on the lateral side of the phalange with swelling and redness. exquisitely tender to palpation. Her wrist is also swollen with tenderness to palpation and extension of the hand. It is difficult to close the hand b/o the pain. No temperature difference. Pulses:     good pulses Neurologic:     sensation intact of her hand.    Impression & Recommendations:  Problem # 1:  ABSCESS, FINGER (ICD-681.00) Concern for tendon or joint infectiont .I have d/w Dr. Zenaida Niece damand  we feel that there might be some joint involvement and the surgeon will not see her today she should be admitted for septic joint and/or tendinitis. Will  get  MRI, U/A, GC probe unrine,  pelvic exam for GC. Will get [facility] as in patient. Will recommend starting rocephin as empirical therapy and if no improvement in 24hr add to Vanc.  DDx Gonorhea, strep and staph. Will call the hand surgeon to evaluate the pt. Will also check pt for HIV Orders: T-Urinalysis ([id number]) T- GC Chlamydia (60454) MRI (MRI) T-HIV Antibody  (Reflex) ([id number]) T-HIV Viral Load ([id number]) T-Culture, Blood Routine ([id number]) T-Culture, Throat ([id number]) T-Wet Prep (in-house) ([id number]) T-Chlamydia (64403) T-GC Probe, genital ([id number]) T-Culture, Blood Routine ([id number])   Complete Medication List:  1)  Centrum Tabs (Multiple vitamins-minerals) .... Take 1 tablet by mouth once a day 2)  Loratadine 10 Mg Tabs (Loratadine) .... Take 1 tablet by mouth once a day as needed for seasonal allergies 3)  Flonase 50 Mcg/act Susp (Fluticasone propionate) .... Place 2 sprays in each nostril daily 4)  Fluconazole 150 Mg Tabs (Fluconazole) .... Take 1 tablet by mouth once     ]

## 2010-03-23 NOTE — Assessment & Plan Note (Signed)
Summary: CHECKUP/ SB.   Vital Signs:  Patient Profile:   30 Years Old Female Height:     69 inches (175.26 cm) Weight:      225.9 pounds (102.68 kg) BMI:     33.48 Temp:     98.8 degrees F (37.11 degrees C) oral Pulse rate:   71 / minute BP sitting:   106 / 69  (right arm) Cuff size:   large  Pt. in pain?   yes    Location:   left leg    Intensity:   3  Vitals Entered By: Krystal Eaton Duncan Dull) (January 20, 2008 8:59 AM)              Is Patient Diabetic? No Nutritional Status BMI of > 30 = obese  Does patient need assistance? Functional Status Self care Ambulation Normal Comments SLOW DUE TO WORK RELATED INJURY (PT TRIED TO STOP CLIENT FROM FALLING)     PCP:  [name] MD  Chief Complaint:  vaginal discharge- recurrent yeast infection.  History of Present Illness: 3 yof with pmh of BV and candidal infection in 6/09. She presentes today with heavy vaginal discharge.  She discribes the discharge as clear, copius and without odor.  She has itching.  No dysuria, no abdomenal pain.  No fevers or chills.  No new sexual partners, no dysparinuria, no sex for >2 months.  Additionally she injured her back 2 months ago catching a patient and is in constant pain.  She is going to [facility] for this and recieving physial therapy as well as orthopedic evaluation.  Sounds like she has had some neuologic symptoms of leg numbness, and an MRI showing a bulging disc.  She has an appointment with [facility] tomorrow.      Prior Medications Reviewed Using: List Brought by Patient  Updated Prior Medication List: IBUPROFEN 800 MG TABS (IBUPROFEN) Take one tablet three times a day for back pain. SKELAXIN 800 MG TABS (METAXALONE) Take one tablet every 12 hours for back pain. VICODIN 5-500 MG TABS (HYDROCODONE-ACETAMINOPHEN) Take one tablet every 8hrs as needed for back pain.  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    Single    Never Smoked    Review of Systems       per hpi   Physical Exam  General:     alert and well-developed.   Head:     normocephalic and atraumatic.   Mouth:     good dentition and pharynx pink and moist.   Abdomen:     soft, non-tender, and normal bowel sounds.   Genitalia:     normal introitus, no external lesions, and vaginal discharge which consists of thick white spots and copius clear mucus.  There are numerous nabothian cysts on the cervix. Skin:     turgor normal, color normal, and no rashes.   Psych:     Oriented X3, memory intact for recent and remote, normally interactive, and good eye contact.      Impression & Recommendations:  Problem # 1:  VAGINITIS (ICD-616.10) Jordan [name] reports having symptoms of vaginitis (discharge, itching, discomfort) every 2 months.  She usually has to take an antibiotic for symptoms to resolve.  She is very frustrated by her recurrent infections, and would like help.  She was given a hand out by Dr. Orpha Bur  and has changed many of her behaviors (types of tampons, pads, soaps, underwear etc) but still has the recurrence.  I told her I would read up on this and try to find something to help her.  HIV screening today.   Orders: T-Wet Prep by Molecular Probe [id number]) T-GC Probe, genital [id number]) T-HIV Antibody  (Reflex) [id number]) T-Chlamydia Probe, genital ([id number])   Complete Medication List: 1)  Ibuprofen 800 Mg Tabs (Ibuprofen) .... Take one tablet three times a day for back pain. 2)  Skelaxin 800 Mg Tabs (Metaxalone) .... Take one tablet every 12 hours for back pain. 3)  Vicodin 5-500 Mg Tabs (Hydrocodone-acetaminophen) .... Take one tablet every 8hrs as needed for back pain.   Patient Instructions: 1)  You had a wet prep done today.  I will call you with results.   ]

## 2010-03-23 NOTE — Assessment & Plan Note (Signed)
Summary: [facility] PAP SMEAR AND/SWELLING IN FEET & HANDS/CFB   Vital Signs:  Patient profile:   30 year old female Height:      69 inches (175.26 cm) Weight:      259.8 pounds (118.09 kg) BMI:     38.50 Temp:     98.1 degrees F (36.72 degrees C) oral Pulse rate:   95 / minute BP sitting:   111 / 76  (right arm)  Vitals Entered By: Stanton Kidney Ditzler RN (August 30, 2008 9:11 AM) CC: Depression Is Patient Diabetic? No Pain Assessment Patient in pain? yes     Location: back Intensity: 4 Nutritional Status BMI of > 30 = obese Nutritional Status Detail appetite good  Have you ever been in a relationship where you felt threatened, hurt or afraid?denies   Does patient need assistance? Functional Status Self care Ambulation Normal Comments Pap smear -  LMP 08/20/08 Both feet swollen. Still has pain in both hands - right is worse. Right and left large wrist splints given per Dr Clent Ridges.   Primary Care Provider:  [name] MD  CC:  Depression.  History of Present Illness: This is a 30 year old woman with past medical history of  Basal cell carcinoma, recurrent in left maxillary sinus (dx'ed with basal cell nouveau -> hereditary syndrome) has had 5 surgeries, last one in 2003. Left ovarian fibroma Sinus cyst Recurrent vaginal candidiasis Gardnerella + in 08/08 Hx of herpetic whitlow requiring I+D (was bitten by the autistic child that cares for).  She is here today for:  1) pap  2) to discuss LE swelling which started  a month ago, seems dependant in nature (i.e feet swell by the end of the day but are not swollen in the am or after elevation). no pain in her ankles or lower legs, no trauma. she has had a 45lb weight gain this year!!  she has been on NSAIDS for 2 years. She has not had any shortness of breath or chest pain.  She is not pregnant   3) continued back pain 2/2 injury 2 years ago while lifting patient.  was doing OK till this december things got much worse.  She has been getting steroid injections which are helping a lot.  Feels that it is time to go back for another one (last one 5 months ago).  Pain has been worse since she started back to light work.  She is not lifting, but now stands for several hours and spends time driving.  She has trouble with insurance coverage of her ortho doc and PT.  She goes to dr Noel Gerold [phone].  4) hand pain/numbness/weakness which has been going on for 2 months.  Worst after waking, has been trying to keep wrists straight at night. Numbness is over the thumb, has had trouble opening containers due to weakness, she has been doing work on a laptop.  Depression History:      The patient denies a depressed mood most of the day and a diminished interest in her usual daily activities.  The patient denies recurrent thoughts of death or suicide.         Preventive Screening-Counseling & Management  Alcohol-Tobacco     Alcohol drinks/day: <1     Alcohol type: beer, liquor     Smoking Status: never     Passive Smoke Exposure: yes  Caffeine-Diet-Exercise     Does Patient Exercise: no  Current Medications (verified): 1)  Methocarbamol 500 Mg Tabs (Methocarbamol) .... Take  1 Tablet By Mouth Three Times A Day As Needed 2)  Acid Controller Max St 20 Mg Tabs (Famotidine) .... Take 1 Pill Once Daily At Bed Time 3)  Etodolac 400 Mg Tabs (Etodolac) .... Two Times A Day With Food 4)  Tramadol Hcl 50 Mg Tabs (Tramadol Hcl) .... Take 1 -2 Tablets By Mouth Three Times A Day As Needed For Back Pain  Allergies (verified): No Known Drug Allergies  Review of Systems       per hpi  Physical Exam  General:  alert and overweight-appearing.   Head:  normocephalic and atraumatic.   Lungs:  normal respiratory effort and normal breath sounds.   Heart:  normal rate, regular rhythm, and no murmur.    Genitalia:  normal introitus, no external lesions, mucosa pink and moist, vaginal discharge, and Nabothian cyst.  thick white discharge, several nabotian cysts over cervix. had to use large metal speculam, cervix oriented towards right hip. Pulses:  2+ Extremities:  trace edema bilaterally Skin:  turgor normal, color normal, and no rashes.   Psych:  Oriented X3, memory intact for recent and remote, and flat affect.     Impression & Recommendations:  Problem # 1:  GYNECOLOGICAL EXAMINATION, ROUTINE (ICD-V72.31)  pap today  Orders: T-Pap Smear, Thin Prep ([id number])  Problem # 2:  LEG EDEMA, BILATERAL (ICD-782.3)  Concerning that long term use of NSAIDS is causing.  will check CMET, TSH and CBC to look for anemia, thyroid, renal or hepatic dysfunction, no reason to suspect heart failure.  Orders: T-Comprehensive Metabolic Panel [id number]) T-CBC No Diff ([id number]) T-TSH ([id number])  Problem # 3:  BACK PAIN, CHRONIC (ICD-724.5) seems to have had good effect with steroid injections lasting 5 months... she will return to Dr. Noel Gerold. I will call and see if he can see her sooner after I get labs back and confirm that NSAIDS are causing her swelling (or at least rule out other causes).  Her updated medication list for this problem includes:    Methocarbamol 500 Mg Tabs (Methocarbamol) .Marland Kitchen... Take 1 tablet by mouth three times a day as needed    Etodolac 400 Mg Tabs (Etodolac) .Marland Kitchen..Marland Kitchen Two times a day with food    Tramadol Hcl 50 Mg Tabs (Tramadol hcl) .Marland Kitchen... Take 1 -2 tablets by mouth three times a day as needed for back pain  Problem # 4:  CARPAL TUNNEL SYNDROME (ICD-354.0) Bilateral R>L.  exam + with wrist flex (phalan maneuver). Will get fitted with braces.  Concern for edema worsening this symptom.  Problem # 5:  GERD (ICD-530.81) much improved. Her updated medication list for this problem includes:     Acid Controller Max St 20 Mg Tabs (Famotidine) .Marland Kitchen... Take 1 pill once daily at bed time  Complete Medication List: 1)  Methocarbamol 500 Mg Tabs (Methocarbamol) .... Take 1 tablet by mouth three times a day as needed 2)  Acid Controller Max St 20 Mg Tabs (Famotidine) .... Take 1 pill once daily at bed time 3)  Etodolac 400 Mg Tabs (Etodolac) .... Two times a day with food 4)  Tramadol Hcl 50 Mg Tabs (Tramadol hcl) .... Take 1 -2 tablets by mouth three times a day as needed for back pain  Patient Instructions: 1)  You had lab work done today, we will call you if there is anything that needs to be addressed. 2)  I am concerned that one of your medications is causing swelling in your hands and feet.  I will need to  discuss this with your back doctor.  Prevention & Chronic Care Immunizations   Influenza vaccine: Not documented    Tetanus booster: Not documented    Pneumococcal vaccine: Not documented  Other Screening   Pap smear:  Specimen Adequacy: Satisfactory for evaluation.   Interpretation/Result:Negative for intraepithelial Lesion or Malignancy.   Interpretation/Result:Fungal organisms c/w Candida present.    Location: [facility].    (07/30/2007)   Pap smear due: 08/2007    Smoking status: never  (08/30/2008)   Appended Document: [facility] PAP SMEAR AND/SWELLING IN FEET & HANDS/CFB    Clinical Lists Changes  Problems: Added new problem of HEALTH MAINTENANCE EXAM (ICD-V70.0) Orders: Added new Referral order of Dental Referral (Dentist) - Signed

## 2010-03-23 NOTE — Assessment & Plan Note (Signed)
Summary: per pt. uti [mkj]   Vital Signs:  Patient Profile:   30 Years Old Female Height:     69 inches (175.26 cm) Weight:      223.9 pounds (101.77 kg) BMI:     33.18 Temp:     97.8 degrees F (36.56 degrees C) oral Pulse rate:   68 / minute Pulse (ortho):   78 / minute BP sitting:   94 / 60  (right arm) BP standing:   109 / 75  Pt. in pain?   yes    Location:   back    Intensity:   6  Vitals Entered By: Stanton Kidney Ditzler RN (January 23, 2008 2:39 PM)              Is Patient Diabetic? No Nutritional Status BMI of > 30 = obese Nutritional Status Detail appetite ok  Have you ever been in a relationship where you felt threatened, hurt or afraid?denies   Does patient need assistance? Functional Status Self care Ambulation Normal     PCP:  [name] MD   History of Present Illness: 30 yowoman who I saw eariler this week for increased vaginal discharge.  At that time wet prep showed BV and yeast.  She has recurrent episodes of BV and has been very frustrated by this so I prescribed a longer course of flagyl as well as metranidazole cream.  She presents today with urine leakage. She reports one instance of frank leakage which occured yesterday morning while she was experienceing severe back pain.  She was sitting down and the pain was unprovoked.  Since that time she has been able to hold her urine, but has felt urgency when she needs to go.  She has had no dysuria.  She called her orthopedist and they asked her to come here to ro UTI before they made any other changes.    Prior Medications Reviewed Using: Patient Recall  Updated Prior Medication List: IBUPROFEN 800 MG TABS (IBUPROFEN) Take one tablet three times a day for back pain. SKELAXIN 800 MG TABS (METAXALONE) Take one tablet every 12 hours for back pain. VICODIN 5-500 MG TABS (HYDROCODONE-ACETAMINOPHEN) Take one tablet every 8hrs as needed for back pain.  METRONIDAZOLE 500 MG TABS (METRONIDAZOLE) One tablet twice a day for 10 days METRONIDAZOLE 0.75 % GEL (METRONIDAZOLE) Apply to affected area daily for 10 days and then 3 times a week for 3 months. FLUCONAZOLE 150 MG TABS (FLUCONAZOLE) Take one tablet.  Current Allergies (reviewed today): No known allergies     Risk Factors: Tobacco use:  never Passive smoke exposure:  yes Drug use:  no HIV high-risk behavior:  no Alcohol use:  yes    Type:  beer, liquor Exercise:  no Seatbelt use:  100 %  PAP Smear History:    Date of Last PAP Smear:  07/30/2007   Review of Systems       per hpi   Physical Exam  General:     alert and overweight-appearing.   Head:     normocephalic and atraumatic.   Heart:     normal rate, regular rhythm, and no murmur.   Abdomen:     soft, non-tender, and normal bowel sounds.   Msk:     Pain in lumbar area.  No CVA tenderness.    Impression & Recommendations:  Problem # 1:  URINARY INCONTINENCE (ICD-788.30) Has occured only once, but for past 48 hours has had urgency.  Will check UA.  Great concern for nerve compression given her severe back injury.  Also may just have been 2/2 severe pain. UA is positive.  Will write pPrescription for bactrim.  Have checked interactions with flagyl ... none on uptodate.  She has an appt with her otho doc on Monday, and if symptoms persist she will be sure to mention them.  Orders: T-Urinalysis Dipstick only ([id number]) T-Culture, Urine ([id number]) T-Urinalysis ([id number])   Complete Medication List: 1)  Ibuprofen 800 Mg Tabs (Ibuprofen) .... Take one tablet three times a day for back pain. 2)  Skelaxin 800 Mg Tabs (Metaxalone) .... Take one tablet every 12 hours for back pain. 3)  Vicodin 5-500 Mg Tabs (Hydrocodone-acetaminophen) .... Take one tablet every 8hrs as needed for back pain. 4)  Metronidazole 500 Mg Tabs (Metronidazole) .... One tablet twice a day for 10 days  5)  Metronidazole 0.75 % Gel (Metronidazole) .... Apply to affected area daily for 10 days and then 3 times a week for 3 months. 6)  Fluconazole 150 Mg Tabs (Fluconazole) .... Take one tablet. 7)  Bactrim Ds 800-160 Mg Tabs (Sulfamethoxazole-trimethoprim) .... Take one tablet daily for 3 days.  Other Orders: Capillary Blood Glucose ([id number]) Fingerstick ([id number])   Patient Instructions: 1)  You have a UTI, this may have caused your symptoms.   2)  You have a new prescription for bactrim DC for 3 days.   Prescriptions: BACTRIM DS 800-160 MG TABS (SULFAMETHOXAZOLE-TRIMETHOPRIM) Take one tablet daily for 3 days.  #3 x 0   Entered and Authorized by:   [name] MD   Signed by:   [name] MD on 01/23/2008   Method used:   Electronically to        [facility] (retail)       [address]       [place], [place]  [address]       Ph: [phone]       Fax: [phone]   RxID:   [id number]  ]  Vital Signs:  Patient Profile:   31 Years Old Female Height:     69 inches (175.26 cm) Weight:      223.9 pounds (101.77 kg) BMI:     33.18 Temp:     97.8 degrees F (36.56 degrees C) oral Pulse rate:   68 / minute Pulse (ortho):   78 / minute BP sitting:   94 / 60 BP standing:   109 / 75    Location:   back    Intensity:   6             Last PAP Result  Specimen Adequacy: Satisfactory for evaluation.   Interpretation/Result:Negative for intraepithelial Lesion or Malignancy.   Interpretation/Result:Fungal organisms c/w Candida present.    Location: [facility].   PapHx   Specimen Adequacy: Satisfactory for evaluation.   Interpretation/Result:Negative for intraepithelial Lesion or Malignancy.   Interpretation/Result:Fungal organisms c/w Candida present.    Location: [facility].   (07/30/2007 1:57:20 PM)      Serial Vital Signs/Assessments:  Time      Position  BP       Pulse  Resp  Temp     By  3:15PM    Lying RA  104/67   70                    Debra Ditzler RN 3:15PM    Sitting   105/73   67  Stanton Kidney Ditzler RN 3:15PM    Standing  109/75   78                    Debra Ditzler RN   Laboratory Results   Urine Tests  Date/Time Received: 01/23/08 3:13PM Date/Time Reported: same  Routine Urinalysis   Color: straw Appearance: Clear Glucose: negative   (Normal Range: Negative) Bilirubin: negative   (Normal Range: Negative) Ketone: trace (5)   (Normal Range: Negative) Spec. Gravity: >=1.030   (Normal Range: 1.003-1.035) Blood: negative   (Normal Range: Negative) pH: 6.0   (Normal Range: 5.0-8.0) Protein: 30   (Normal Range: Negative) Urobilinogen: 1.0   (Normal Range: 0-1) Nitrite: negative   (Normal Range: Negative) Leukocyte Esterace: trace   (Normal Range: Negative)

## 2010-03-23 NOTE — Assessment & Plan Note (Signed)
Summary: ACUTE/[facility]/1 WEEK RECHECK PER [facility]   Vital Signs:  Patient profile:   30 year old female Height:      69 inches (175.26 cm) Weight:      269.7 pounds (122.59 kg) BMI:     39.97 O2 Sat:      100 % on Room air Temp:     98.6 degrees F Pulse rate:   75 / minute BP sitting:   97 / 67  (left arm) BP standing:   96 / 64  (left arm)  Vitals Entered By: Dorie Rank RN (June 08, 2009 1:28 PM)  O2 Flow:  Room air CC: here for f/u for bacterial and fungal and sinus infection - "feel better and 1/2 way through antibiotics" Is Patient Diabetic? No Pain Assessment Patient in pain? no      Nutritional Status BMI of > 30 = obese  Have you ever been in a relationship where you felt threatened, hurt or afraid?No  Domestic Violence Intervention friend at side  Does patient need assistance? Functional Status Self care Ambulation Normal   Primary Care Provider:  [name] MD  CC:  here for f/u for bacterial and fungal and sinus infection - "feel better and 1/2 way through antibiotics".  History of Present Illness: 30 yo f with Past Medical History:  Basal cell carcinoma, recurrent in left maxillary sinus (dx'ed with basal cell nouveau -> hereditary syndrome) has had 5 surgeries, last one in 2003. Left ovarian fibroma Sinus cyst Recurrent vaginal candidiasis Gardnerella + in 08/08 Hx of herpetic whitlow requiring I+D (was bitten by the autistic child that cares for).   presents to [facility] for 1 week f/u after she was treated for vaginitis, and for sinusitis.  Today patient reports resolution symptoms, and that she is 1/2 way through her ABX rx. I informed her to continue abx regiment to complete it.    Patient currently denies SOB, Denies CP, Denies fever, chills, nausea, vomiting, diarrhea, constipation and otherwise doing well and denies any other complaints.           Preventive Screening-Counseling & Management  Alcohol-Tobacco     Alcohol drinks/day:  <1     Alcohol type: beer, liquor     Smoking Status: never     Passive Smoke Exposure: yes  Caffeine-Diet-Exercise     Does Patient Exercise: no  Current Medications (verified): 1)  Methocarbamol 500 Mg Tabs (Methocarbamol) .... Take 1 Tablet By Mouth Three Times A Day As Needed 2)  Acid Controller Max St 20 Mg Tabs (Famotidine) .... Take 1 Pill Once Daily At Bed Time 3)  Tramadol Hcl 50 Mg Tabs (Tramadol Hcl) .... Take 1 -2 Tablets By Mouth Three Times A Day As Needed For Back Pain 4)  Diflucan 200 Mg Tabs (Fluconazole) .... Take 1 Tablet By Mouth Once 5)  Doxycycline Hyclate 100 Mg Tabs (Doxycycline Hyclate) .... Take 1 Tablet By Mouth Two Times A Day  Allergies (verified): No Known Drug Allergies  Review of Systems       Per HPI  Physical Exam  General:  alert and overweight-appearing.   Mouth:  MMM Neck:  No deformities, masses, or tenderness noted. Lungs:  Normal respiratory effort, chest expands symmetrically. Lungs are clear to auscultation, no crackles or wheezes. Heart:  Normal rate and regular rhythm. S1 and S2 normal without gallop, murmur, click, rub or other extra sounds. Abdomen:  Bowel sounds positive,abdomen soft and non-tender without masses, organomegaly or hernias noted. Msk:  No  deformity or scoliosis noted of thoracic or lumbar spine.   Pulses:  2+ Extremities:  no edema Neurologic:  No cranial nerve deficits noted. Station and gait are normal. Plantar reflexes are down-going bilaterally. DTRs are symmetrical throughout. Sensory, motor and coordinative functions appear intact.   Impression & Recommendations:  Problem # 1:  SINUSITIS, ACUTE (ICD-461.9) patient was treated last week with doxy for 2 weeks for sinusitis, today she is doing better, i advised her to continue abx and have f/u in 6 months.   Her updated medication list for this problem includes:    Metronidazole 500 Mg Tabs (Metronidazole) .Marland Kitchen... Take 1 tablet by mouth two times a day     Doxycycline Hyclate 100 Mg Tabs (Doxycycline hyclate) .Marland Kitchen... Take 1 tablet by mouth two times a day    Eq Allergy-d 5-120 Mg Xr12h-tab (Cetirizine-pseudoephedrine) .Marland Kitchen... Take 1 tablet by mouth two times a day  Problem # 2:  VAGINITIS (ICD-616.10)  her for vaginitis and candiasis was well treated, now symptoms have resolved.  will make f/u in 6 months.    Her updated medication list for this problem includes:    Metronidazole 500 Mg Tabs (Metronidazole) .Marland Kitchen... Take 1 tablet by mouth two times a day    Doxycycline Hyclate 100 Mg Tabs (Doxycycline hyclate) .Marland Kitchen... Take 1 tablet by mouth two times a day  Complete Medication List: 1)  Methocarbamol 500 Mg Tabs (Methocarbamol) .... Take 1 tablet by mouth three times a day as needed 2)  Acid Controller Max St 20 Mg Tabs (Famotidine) .... Take 1 pill once daily at bed time 3)  Tramadol Hcl 50 Mg Tabs (Tramadol hcl) .... Take 1 -2 tablets by mouth three times a day as needed for back pain 4)  Diflucan 200 Mg Tabs (Fluconazole) .... Take 1 tablet by mouth once 5)  Doxycycline Hyclate 100 Mg Tabs (Doxycycline hyclate) .... Take 1 tablet by mouth two times a day  Patient Instructions: 1)  Please schedule a follow-up appointment in 6 months.   Prevention & Chronic Care Immunizations   Influenza vaccine: Not documented   Influenza vaccine deferral: Deferred  (06/08/2009)    Tetanus booster: Not documented   Td booster deferral: Deferred  (06/08/2009)    Pneumococcal vaccine: Not documented  Other Screening   Pap smear: NEGATIVE FOR INTRAEPITHELIAL LESIONS OR MALIGNANCY.  (08/30/2008)   Pap smear due: 08/2007   Smoking status: never  (06/08/2009)

## 2010-03-23 NOTE — Assessment & Plan Note (Signed)
Summary: ACUTE-COUGHING/CHEST AND THROAT/THROWING UP/   Vital Signs:  Patient profile:   30 year old female Height:      69 inches (175.26 cm) Weight:      269.2 pounds (124.23 kg) BMI:     39.90 Temp:     99.0 degrees F (37.22 degrees C) oral Pulse (ortho):   89 / minute BP sitting:   106 / 77  (left arm) Cuff size:   large  Vitals Entered By: Theotis Barrio NT II (June 01, 2009 3:33 PM) CC: SORE THROAT - CHEST-ABD SORE FROM COUGHING WITH GAG REFLEX / HEAD CONGESTED / SINUS DRAINAGE /  HEADACHE Is Patient Diabetic? No Pain Assessment Patient in pain? yes     Location: LEFT FOOT Intensity:          4 Type: aching Onset of pain  FOR ABOUT A WEEK  Nutritional Status BMI of 25 - 29 = overweight  Have you ever been in a relationship where you felt threatened, hurt or afraid?No   Does patient need assistance? Functional Status Self care Ambulation Normal Comments SINUS INFECTION  /  MEDICATION REFILL /    Primary Care Provider:  [name] MD  CC:  SORE THROAT - CHEST-ABD SORE FROM COUGHING WITH GAG REFLEX / HEAD CONGESTED / SINUS DRAINAGE /  HEADACHE.  History of Present Illness: 30 yr young AAF persents with sever hedache, sinus pressure, sore throat and post nasal drip. She is having this problem for now 5 days. It is progressively getting worse. She has no fever but her oral intake has decreased. She fills tired and heavy headed. She also was examined for vaginitis on last visit and was yet to be treated.   Preventive Screening-Counseling & Management  Alcohol-Tobacco     Alcohol drinks/day: <1     Alcohol type: beer, liquor     Smoking Status: never     Passive Smoke Exposure: yes  Caffeine-Diet-Exercise     Does Patient Exercise: no  Allergies (verified): No Known Drug Allergies  Past History:  Past Medical History: Last updated: 07/30/2007 Basal cell carcinoma, recurrent in left maxillary sinus (dx'ed with basal cell nouveau -> hereditary syndrome)  has had 5 surgeries, last one in 2003. Left ovarian fibroma Sinus cyst Recurrent vaginal candidiasis Gardnerella + in 08/08 Hx of herpetic whitlow requiring I+D (was bitten by the autistic child that cares for).  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 Single Never Smoked  Risk Factors: Alcohol Use: <1 (06/01/2009) Exercise: no (06/01/2009)  Risk Factors: Smoking Status: never (06/01/2009) Passive Smoke Exposure: yes (06/01/2009)  Review of Systems      See HPI  Physical Exam  General:  alert and overweight-appearing.   Head:  normocephalic and atraumatic.  sinus tendernss in maxillary and frontal areas Eyes:  No corneal or conjunctival inflammation noted. EOMI. Perrla.  Vision grossly normal. Ears:  BOTH TM are buldging Nose:  mucosal erythema.   Mouth:  pharynx is inflammed.  Neck:  No deformities, masses, or tenderness noted. Lungs:  Normal respiratory effort, chest expands symmetrically. Lungs are clear to auscultation, no crackles or wheezes. Heart:  Normal rate and regular rhythm. S1 and S2 normal without gallop, murmur, click, rub or other extra sounds.  Abdomen:  Bowel sounds positive,abdomen soft and non-tender without masses, organomegaly or hernias noted. Msk:  No deformity or scoliosis noted of thoracic or lumbar spine.   Neurologic:  No cranial nerve deficits noted. Station and gait are normal. Plantar reflexes are down-going bilaterally. DTRs are symmetrical throughout. Sensory, motor and coordinative functions appear intact. Psych:  Cognition and judgment appear intact. Alert and cooperative with normal attention span and concentration. No apparent delusions, illusions,  hallucinations   Impression & Recommendations:  Problem # 1:  VAGINITIS (ICD-616.10) Will treat her for vaginitis and candiasis.    Her updated medication list for this problem includes:    Metronidazole 500 Mg Tabs (Metronidazole) .Marland Kitchen... Take 1 tablet by mouth two times a day    Doxycycline Hyclate 100 Mg Tabs (Doxycycline hyclate) .Marland Kitchen... Take 1 tablet by mouth two times a day  Problem # 2:  SINUSITIS, ACUTE (ICD-461.9) She has moderate to severe acute sinusitis. She will be treated with doxycycline as she can not afford augmentin or azithro at presetn. I have told her to call back if she does not improve in 5 days, she will be prescribed augmentin.   Her updated medication list for this problem includes:    Metronidazole 500 Mg Tabs (Metronidazole) .Marland Kitchen... Take 1 tablet by mouth two times a day    Doxycycline Hyclate 100 Mg Tabs (Doxycycline hyclate) .Marland Kitchen... Take 1 tablet by mouth two times a day    Eq Allergy-d 5-120 Mg Xr12h-tab (Cetirizine-pseudoephedrine) .Marland Kitchen... Take 1 tablet by mouth two times a day  Problem # 3:  PLANTAR FASCIITIS, BILATERAL (ICD-728.71) resolved at present. Does not have significant impact on DAL.   Problem # 4:  ALLERGIC RHINITIS (ICD-477.9) I will start her on Zyrtec D generic equivalent for 2 weeks. Then she can be mainatined on cetrizine.   Problem # 5:  GERD (ICD-530.81) Her symptoms are well controlled. No changed made in her meds.  Her updated medication list for this problem includes:    Acid Controller Max St 20 Mg Tabs (Famotidine) .Marland Kitchen... Take 1 pill once daily at bed time  Complete Medication List: 1)  Methocarbamol 500 Mg Tabs (Methocarbamol) .... Take 1 tablet by mouth three times a day as needed 2)  Acid Controller Max St 20 Mg Tabs (Famotidine) .... Take 1 pill once daily at bed time 3)  Tramadol Hcl 50 Mg Tabs (Tramadol hcl) .... Take 1 -2 tablets by mouth three times a day as needed for back pain 4)  Metronidazole 500 Mg Tabs (Metronidazole) ....  Take 1 tablet by mouth two times a day 5)  Diflucan 200 Mg Tabs (Fluconazole) .... Take 1 tablet by mouth once 6)  Doxycycline Hyclate 100 Mg Tabs (Doxycycline hyclate) .... Take 1 tablet by mouth two times a day 7)  Eq Allergy-d 5-120 Mg Xr12h-tab (Cetirizine-pseudoephedrine) .... Take 1 tablet by mouth two times a day  Patient Instructions: 1)  Call us back if you dont get better in 3-5 days. 2)  Use nasal saline to watch out your sinuses.  Prescriptions: EQ ALLERGY-D 5-120 MG XR12H-TAB (CETIRIZINE-PSEUDOEPHEDRINE) Take 1 tablet by mouth two times a day  #14 x 0   Entered and Authorized by:   Clerance Lav MD   Signed by:   Clerance Lav MD on 06/01/2009   Method used:   Electronically to        [facility] [address] (retail)       [address], [address]  [place], [place]  [address]       Ph: [phone]       Fax: [phone]   RxID:   [id number] DOXYCYCLINE HYCLATE 100 MG TABS (DOXYCYCLINE HYCLATE) Take 1 tablet by mouth two times a day  #20 x 0   Entered and Authorized by:   Clerance Lav MD   Signed by:   Clerance Lav MD on 06/01/2009   Method used:   Electronically to        [facility] [address] (retail)       [address], [address]       [place], [place]  [address]       Ph: [phone]       Fax: [phone]   RxID:   [id number] DIFLUCAN 200 MG TABS (FLUCONAZOLE) Take 1 tablet by mouth once  #1 x 3   Entered and Authorized by:   Clerance Lav MD   Signed by:   Clerance Lav MD on 06/01/2009   Method used:   Electronically to        [facility] [phone]* (retail)       [address]       [place], [place]  [address]       Ph: [phone]       Fax: [phone]   RxID:   [id number] METRONIDAZOLE 500 MG TABS (METRONIDAZOLE) Take 1 tablet by mouth two times a day  #14 x 0   Entered and Authorized by:   Clerance Lav MD   Signed by:   Clerance Lav MD on 06/01/2009   Method  used:   Electronically to        [phone]* (retail)       [address]       [place], [place]  [address]       Ph: [phone]       Fax: [phone]   RxID:   [id number]

## 2010-03-23 NOTE — Progress Notes (Signed)
Summary: refill/gg  Phone Note Refill Request  on April 22, 2009 11:11 AM  Refills Requested: Medication #1:  ACID CONTROLLER MAX ST 20 MG TABS Take 1 pill once daily at bed time  Medication #2:  TRAMADOL HCL 50 MG TABS Take 1 -2 tablets by mouth three times a day as needed for back pain  Method Requested: Fax to Local Pharmacy Initial call taken by: Merrie Roof RN,  April 22, 2009 11:12 AM  Follow-up for Phone Call        I looks like we have not been filling the tramadol in the recent past.  has something changed about her pain and now she needs tramadol again, or does her orthopedist usually prescribe this?    Prescriptions: ACID CONTROLLER MAX ST 20 MG TABS (FAMOTIDINE) Take 1 pill once daily at bed time  #30 x 3   Entered and Authorized by:   [name] MD   Signed by:   [name] MD on 04/23/2009   Method used:   Electronically to        [facility]  [address] [phone]* (retail)       [address]       [place], [place]  [address]       Ph: [phone]       Fax: [phone]   RxID:   [id number]

## 2010-03-23 NOTE — Progress Notes (Signed)
Summary: Refill/gh  Phone Note Refill Request Message from:  Fax from Pharmacy on February 14, 2010 3:33 PM  Refills Requested: Medication #1:  ULTRAM 50 MG TABS Take one tab by mouth ever 6 hours as needed for your back pain.   Last Refilled: 01/18/2010 Last office visit was 01/17/2010.   Method Requested: Electronic Initial call taken by: Angelina Ok RN,  February 14, 2010 3:33 PM  Follow-up for Phone Call        Refill approved-nurse to complete Follow-up by: Julaine Fusi  DO,  February 14, 2010 3:34 PM    Prescriptions: ULTRAM 50 MG TABS (TRAMADOL HCL) Take one tab by mouth ever 6 hours as needed for your back pain.  #56 x 0   Entered and Authorized by:   Julaine Fusi  DO   Signed by:   Julaine Fusi  DO on 02/14/2010   Method used:   Electronically to        [facility] [address] (retail)       [address]       [place], [place]  [address]       Ph: [phone]       Fax: [phone]   RxID:   [id number]

## 2010-03-23 NOTE — Miscellaneous (Signed)
Summary: [facility]   Imported By: Florinda Marker 06/01/2008 14:48:30  _____________________________________________________________________  External Attachment:    Type:   Image     Comment:   External Document

## 2010-03-23 NOTE — Assessment & Plan Note (Signed)
Summary: check up [mkj]   Vital Signs:  Patient profile:   30 year old female Height:      69 inches (175.26 cm) Weight:      262.3 pounds (119.23 kg) BMI:     38.87 Temp:     97.9 degrees F (36.61 degrees C) oral Pulse rate:   80 / minute BP sitting:   102 / 68  (right arm)  Vitals Entered By: Stanton Kidney Ditzler RN (February 24, 2009 9:07 AM) Is Patient Diabetic? No Pain Assessment Patient in pain? yes     Location: back Intensity: 2 Onset of pain  long time Nutritional Status BMI of > 30 = obese Nutritional Status Detail appetite good  Have you ever been in a relationship where you felt threatened, hurt or afraid?denies   Does patient need assistance? Functional Status Self care Ambulation Normal Comments Friend with pt. Ck-up and left knee pain for 3 weeks. Given large right and left wrist splints per Dr Clent Ridges.   Primary Care Provider:  [name] MD   History of Present Illness: This is a 30 year old woman with past medical history listed below.  She is here with complaint of continued bilateral wrist pain which has been going on for >93months now and pain in the left knee for 3 weeks.  Knee pain is sharp, intermittant, not associated with any specific motion or weight baring.  No radiation down the leg.     Depression History:      The patient denies a depressed mood most of the day and a diminished interest in her usual daily activities.         Preventive Screening-Counseling & Management  Alcohol-Tobacco     Alcohol drinks/day: <1     Alcohol type: beer, liquor     Smoking Status: never     Passive Smoke Exposure: yes  Caffeine-Diet-Exercise     Does Patient Exercise: no  Current Medications (verified): 1)  Methocarbamol 500 Mg Tabs (Methocarbamol) .... Take 1 Tablet By Mouth Three Times A Day As Needed 2)  Acid Controller Max St 20 Mg Tabs (Famotidine) .... Take 1 Pill Once Daily At Bed Time 3)  Tramadol Hcl 50 Mg Tabs (Tramadol Hcl) .... Take 1 -2  Tablets By Mouth Three Times A Day As Needed For Back Pain  Allergies (verified): No Known Drug Allergies  Review of Systems       per hpi  Physical Exam  General:  alert and overweight-appearing.   Head:  normocephalic and atraumatic.   Mouth:  good dentition and pharynx pink and moist.   Lungs:  normal respiratory effort and normal breath sounds.   Heart:  normal rate, regular rhythm, and no murmur.   Pulses:  2+ Extremities:  no edema Neurologic:  alert & oriented X3, cranial nerves II-XII intact, strength normal in all extremities, and gait normal.  positive symptoms of carpal tunnel with wrist flexion. Skin:  no suspicious lesions.   Psych:  Oriented X3, memory intact for recent and remote, normally interactive, and flat affect.     Impression & Recommendations:  Problem # 1:  CARPAL TUNNEL SYNDROME (ICD-354.0) Would like bilateral wrist braces.  Still with numbness in both hands which has been going on for months, worst in the morning and getting more pronounced.  Will try to get braces today.  If these do not work she will rtc.  Problem # 2:  LEG EDEMA, BILATERAL (ICD-782.3) improved. now occurs only when she  is standing for long periods of time.  she has stopped NSAIDS.  Problem # 3:  BACK PAIN, CHRONIC (ICD-724.5) still with back pain, and reports that it is increasing.  worse since she tried to do some heavy lifting at a side job.  She would like an MRI.  I advised that she wait until she has recovered from the recent re-injury.  No neurologic symptoms (leg weakness, incontinence etc).  She follows with Dr. Noel Gerold for this issue and can follow up with him for futher guidance.  I encouraged her to consider non manual labor jobs... she is in vocational rehab to work on this.  The following medications were removed from the medication list:    Etodolac 400 Mg Tabs (Etodolac) .Marland Kitchen..Marland Kitchen Two times a day with food Her updated medication list for this problem includes:     Methocarbamol 500 Mg Tabs (Methocarbamol) .Marland Kitchen... Take 1 tablet by mouth three times a day as needed    Tramadol Hcl 50 Mg Tabs (Tramadol hcl) .Marland Kitchen... Take 1 -2 tablets by mouth three times a day as needed for back pain  Complete Medication List: 1)  Methocarbamol 500 Mg Tabs (Methocarbamol) .... Take 1 tablet by mouth three times a day as needed 2)  Acid Controller Max St 20 Mg Tabs (Famotidine) .... Take 1 pill once daily at bed time 3)  Tramadol Hcl 50 Mg Tabs (Tramadol hcl) .... Take 1 -2 tablets by mouth three times a day as needed for back pain  Patient Instructions: 1)  Please make a follow up appointment in July for your next pap exam. 2)  Follow up with your orthopedist regarding changes in back pain. 3)  Do strengthening exercises on both needs to keep them both strong. 4)  We will try to find wrist braces for both wrists.  Wear them at night and when you are using your wrists- such as at a computer or when writing.  If they do not relieve your symptoms please call our office to let us know.  Prevention & Chronic Care Immunizations   Influenza vaccine: Not documented    Tetanus booster: Not documented    Pneumococcal vaccine: Not documented  Other Screening   Pap smear: NEGATIVE FOR INTRAEPITHELIAL LESIONS OR MALIGNANCY.  (08/30/2008)   Pap smear due: 08/2007   Smoking status: never  (02/24/2009)

## 2010-03-23 NOTE — Miscellaneous (Signed)
Summary: HIV Test  HIV Test   Imported By: Florinda Marker 01/21/2008 15:03:18  _____________________________________________________________________  External Attachment:    Type:   Image     Comment:   External Document

## 2010-03-23 NOTE — Letter (Signed)
Summary: Handout Printed  Printed Handout:  - *Patient Instructions 

## 2010-03-23 NOTE — Letter (Signed)
Summary: [facility]: Note  [facility]: Note   Imported By: Florinda Marker 04/29/2008 12:17:08  _____________________________________________________________________  External Attachment:    Type:   Image     Comment:   External Document

## 2010-03-23 NOTE — Assessment & Plan Note (Signed)
Summary: ACUTE-F/U WITH INFECTION-([facility])/CFB   Vital Signs:  Patient Profile:   30 Years Old Female Height:     69 inches (175.26 cm) Weight:      228.02 pounds (103.65 kg) BMI:     33.79 Temp:     97 degrees F (36.11 degrees C) oral Pulse rate:   66 / minute BP sitting:   92 / 60  (right arm)  Pt. in pain?   yes    Location:   back    Intensity:   3    Type:       aching  Vitals Entered By: Chinita Pester RN (February 25, 2008 10:07 AM)              Is Patient Diabetic? No Nutritional Status BMI of > 30 = obese  Does patient need assistance? Functional Status Self care Ambulation Normal     PCP:  [name] MD  Chief Complaint:  follow up for infections, vaginal discharge for 2 weeks, and acid reflux.  History of Present Illness: 30 yo woman with past medical history of recurrent vaginal infections being treated with Flagyl and fluconazole and a recent UTI in december 2009 being treated with Bactrim DS comes to the clinic with chief complaints of Vaginal discharge for the last 2 weeks. The discharge is clear, not foul smelling, no bleeding, or itching.. She reports no changes in her menstrual cycles and denies any bleeding in between her cycles. She denies any N/V/abdominal pain or any other complaints. She denies using any devices intravaginally and reports that she is not sexually active and her last sexual intercourse was 5 months back.     Updated Prior Medication List: SKELAXIN 800 MG TABS (METAXALONE) Take one tablet every 12 hours for back pain. VICODIN 5-500 MG TABS (HYDROCODONE-ACETAMINOPHEN) Take one tablet every 8hrs as needed for back pain.   Current Allergies: No known allergies   Past Medical History:    Reviewed history from 07/30/2007 and no changes required:       Basal cell carcinoma, recurrent in left maxillary sinus (dx'ed with basal cell nouveau -> hereditary syndrome) has had 5 surgeries, last one in 2003.       Left ovarian fibroma        Sinus cyst       Recurrent vaginal candidiasis       Gardnerella + in 08/08       Hx of herpetic whitlow requiring I+D (was bitten by the autistic child that cares for).  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       Single       Never Smoked   Risk Factors:  Tobacco use:  never Passive smoke exposure:  yes Drug use:  no HIV high-risk behavior:  no Alcohol use:  no Exercise:  no Seatbelt use:  100 %  PAP Smear History:    Date of Last PAP Smear:  07/30/2007   Serial Vital Signs/Assessments:  Time      Position  BP  Pulse  Resp  Temp     By 10:15 am  L Arm     98/64                          Chinita Pester RN   Review of Systems      See HPI   Physical Exam  General:     Well-developed,well-nourished,in no acute distress; alert,appropriate and cooperative throughout examination Eyes:     No corneal or conjunctival inflammation noted. EOMI. Perrla. Funduscopic exam benign, without hemorrhages, exudates or papilledema. Vision grossly normal. Mouth:     Oral mucosa and oropharynx without lesions or exudates.  Teeth in good repair. Neck:     No deformities, masses, or tenderness noted. Lungs:     Normal respiratory effort, chest expands symmetrically. Lungs are clear to auscultation, no crackles or wheezes. Heart:     Normal rate and regular rhythm. S1 and S2 normal without gallop, murmur, click, rub or other extra sounds. Abdomen:     Bowel sounds positive,abdomen soft and non-tender without masses, organomegaly or hernias noted. Pulses:     R radial normal.   Extremities:      No clubbing, cyanosis, edema, or deformity noted with normal full range of motion of all joints.   Neurologic:     alert & oriented X3, strength normal in all extremities, and gait normal.   Additional Exam:     Pelvic exam: Vulva and vagina appear normal. No signs of irritation, itching, or any foul smelling discharge. On colposcopic examination, there is very mild white discharge - physiologic vs infectious. Wetprep is done and send for Trichomonas, clue cells and yeast.    Impression & Recommendations:  Problem # 1:  VAGINAL DISCHARGE (ICD-623.5) Recurrent vaginal discharge over the last 2-3 years. Per patient, this has been increasing in frequency. Last time she had gardenella and yeast and was treated appropriately.  She denies any sexual activity or intravaginal devices, so its very hard to explain why she is been having these recurrent vaginal infections. We will get a wet prep, HbA1C to rule out any hidden diabetes  and CMP. We will follow up on the labs. Will not treat empirically with antibiotics today. If the results come back abnormal tomorrow, i will call and give an electronic prescription. She needs a gynecology work up. We will refer her to a gynecologist.  The following medications were removed from the medication list:    Metronidazole 0.75 % Gel (Metronidazole) .Marland Kitchen... Apply to affected area daily for 10 days and then 3 times a week for 3 months.  Orders: Gynecologic Referral (Gyn) T-Comprehensive Metabolic Panel [phone]) T-Hgb A1C (in-house) [phone]) T-Wet Prep for Christoper Allegra, Clue Cells [phone])   Problem # 2:  BACK PAIN, CHRONIC (ICD-724.5)  Patient reports chronic back since august 2008 when she had a full work up and was diagnosed as herniated disk. She is being followed in orthopedics and physiotherapist. Ortho is managing the pain medications and recently was stopped on Ibuprofen as it was causing acid refluxes. Agree with stopping ibuprofen and we will give Famotidine at bed time and a new problem back pain is added to the problem list. Management is per ortho and physiotherapy.  The following medications were removed from the medication list:    Ibuprofen 800 Mg Tabs (Ibuprofen) .Marland Kitchen... Take one tablet three times a day for back pain.  Her updated medication list for this problem includes:  Skelaxin 800 Mg Tabs (Metaxalone) .Marland Kitchen... Take one tablet every 12 hours for back pain.    Vicodin 5-500 Mg Tabs (Hydrocodone-acetaminophen) .Marland Kitchen... Take one tablet every 8hrs as needed for back pain.   Problem # 3:  GERD (ICD-530.81) It was thought secondary to ibuprofen 800 mg three times a day, which she has been taking for a long time for her herniated disc problems. Ibuprofen was discontinued by ortho. We will give famotidine and follow up.  Her updated medication list for this problem includes:    Acid Controller Max St 20 Mg Tabs (Famotidine) .Marland Kitchen... Take 1 pill once daily at bed time   Complete Medication List: 1)  Skelaxin 800 Mg Tabs (Metaxalone) .... Take one tablet every 12 hours for back pain. 2)  Vicodin 5-500 Mg Tabs (Hydrocodone-acetaminophen) .... Take one tablet every 8hrs as needed for back pain. 3)  Acid Controller Max St 20 Mg Tabs (Famotidine) .... Take 1 pill once daily at bed time  Other Orders: T-CBC w/Diff ([id number])   Patient Instructions: 1)  Please schedule a follow-up appointment in 3 months. 2)  I will call you if the results of your wet prep test come back abnormal and will give you  the treatment depending upton the lab results.  3)  See the gynecologist as scheduled for the recurrent vaginal infections. 4)  Take allthe medications as advisd before. 5)  Take Famotidine 20 mg 1 pill once daily at bed time for the acid reflux.   Prescriptions: ACID CONTROLLER MAX ST 20 MG TABS (FAMOTIDINE) Take 1 pill once daily at bed time  #30 x 3   Entered and Authorized by:   Blondell Reveal MD   Signed by:   Blondell Reveal MD on 02/25/2008   Method used:   Print then Give to Patient   RxID:   [id number]  ] Laboratory Results   Blood Tests   Date/Time Received: February 25, 2008 11:54 AM. Date/Time Reported: [name]  February 25, 2008 11:54 AM  HGBA1C: 5.7%   (Normal Range: Non-Diabetic - 3-6%   Control Diabetic - 6-8%)

## 2010-03-23 NOTE — Assessment & Plan Note (Signed)
Summary: EST-CK/FU/MEDS/CFB   Vital Signs:  Patient Profile:   30 Years Old Female Height:     69 inches (175.26 cm) Weight:      223.6 pounds (101.64 kg) BMI:     33.14 Temp:     98.4 degrees F (36.89 degrees C) Pulse rate:   64 / minute BP sitting:   98 / 63  (right arm)  Pt. in pain?   no  Vitals Entered By: Chinita Pester RN (March 17, 2007 3:50 PM)              Is Patient Diabetic? No Nutritional Status BMI of > 30 = obese  Have you ever been in a relationship where you felt threatened, hurt or afraid?Unable to ask; pt. not alone   Does patient need assistance? Functional Status Self care Ambulation Normal     PCP:  Yvonne Kendall, MD  Chief Complaint:  vaginal discharge x2 weekswith lower abd. pain also needs physical.  History of Present Illness: This is a 30 year old woman with past medical history of   Basal cell carcinoma, recurrent in left maxillary sinus (dx'ed with basal cell nouveau -> hereditary syndrome) has had 5 surgeries, last one in 2003. Left ovarian fibroma Sinus cyst  Is here today for a physical for work.  Has had vaginal discharge, thick white/yellow for past 2 weeks.  Last sexual activity 3 months ago. LMP early January. Has had some itching, no dysuria.  Has had some intermittant abdomenal pain, very short shooting pain in the suprapubic area, no fevers or chills. Hx. of ovarian cyst and falopian tube removal.  On last pap abnormal cervical pigmentation visualized, cervical pathology was normal, she was sent for colposcopy which was normal, large ectropion was visualized, no biopsy taken.  Recomendation was for regular pap in one year.     Current Allergies: ASPIRIN  Past Medical History:    Basal cell carcinoma, recurrent in left maxillary sinus (dx'ed with basal cell nouveau -> hereditary syndrome) has had 5 surgeries, last one in 2003.    Left ovarian fibroma    Sinus cyst    Risk Factors:  Tobacco use:  never  Passive smoke exposure:  yes Drug use:  no HIV high-risk behavior:  no Alcohol use:  yes    Type:  beer, liquor Exercise:  no Seatbelt use:  100 %  PAP Smear History:     Date of Last PAP Smear:  08/07/2006    Results:  Pathology normal.  Possible pigmented lesion.  Pt was sent for colposcopy, which was negative.    Review of Systems  General      Denies chills, fatigue, fever, loss of appetite, malaise, sleep disorder, sweats, and weakness.  ENT      Denies nasal congestion, postnasal drainage, and sinus pressure.  Resp      Denies chest discomfort, cough, and shortness of breath.  GI      Complains of abdominal pain.      Denies nausea and vomiting.  GU      Complains of discharge.      Denies abnormal vaginal bleeding, dysuria, genital sores, hematuria, and urinary frequency.  MS      Denies joint pain, joint redness, and muscle aches.   Physical Exam  General:     alert.  alert.  alert and overweight-appearing.   Head:     normocephalic and atraumatic.  normocephalic and atraumatic.  normocephalic and atraumatic.   Eyes:  vision grossly intact, pupils equal, pupils round, and pupils reactive to light.  No injection.vision grossly intact, pupils equal, pupils round, and pupils reactive to light.  vision grossly intact, pupils equal, pupils round, and pupils reactive to light.   Ears:     R ear normal and L ear normal.  R ear normal and L ear normal.  R ear normal and L ear normal.   Nose:     no external deformity.  no external deformity.  no external deformity.   Mouth:     good dentition and pharynx pink and moist.  good dentition and pharynx pink and moist.  good dentition and pharynx pink and moist.   Neck:     supple, full ROM, and no masses.  supple, full ROM, and no masses.  supple, full ROM, and no masses.   Chest Wall:     no deformities.  no deformities.  no deformities.   Lungs:      normal respiratory effort and normal breath sounds.  normal respiratory effort and normal breath sounds.  normal respiratory effort and normal breath sounds.   Heart:     normal rate, regular rhythm, and no murmur.  normal rate, regular rhythm, and no murmur.  normal rate, regular rhythm, and no murmur.   Abdomen:     soft, non-tender, and normal bowel sounds.  soft, non-tender, and normal bowel sounds.  soft, non-tender, and normal bowel sounds.   Genitalia:     normal introitus, no external lesions, mucosa pink and moist, no vaginal or cervical lesions, and moderate vaginal discharge which is thick, white and with clumps.  Cervix is non tender with no lesions or pigmentation.  normal introitus, no external lesions, mucosa pink and moist, no vaginal or cervical lesions, and vaginal discharge.   Pulses:     2+ Extremities:     no edema Neurologic:     alert & oriented X3, cranial nerves II-XII intact, strength normal in all extremities, sensation intact to light touch, gait normal, and DTRs symmetrical and normal.  alert & oriented X3, cranial nerves II-XII intact, strength normal in all extremities, sensation intact to light touch, gait normal, and DTRs symmetrical and normal.   Skin:     turgor normal, color normal, and no suspicious lesions.  turgor normal, color normal, and no suspicious lesions.   Cervical Nodes:     no anterior cervical adenopathy and no posterior cervical adenopathy.  no anterior cervical adenopathy and no posterior cervical adenopathy.   Psych:     Oriented X3, memory intact for recent and remote, normally interactive, good eye contact, and not anxious appearing.  Oriented X3, memory intact for recent and remote, normally interactive, good eye contact, and not anxious appearing.      Impression & Recommendations:  Problem # 1:  LEUKORRHEA (ICD-623.5)  Has had thick white discharge for >2 weeks, intermittant abdomenal pain (some sharp pains, not constant).  Exam suggestive of yeast infection with white, chunky discharge over vaginal walls.  Abdoemenal exam benign, cervix is non tender, no lesions or pigment visualized. Sent wet prep.  Also send urine G/c, clamydia, and pregnancy.  Will call patient tomorrow with results.  Her phone number is [phone], she would like prescriptions called to [facility] [address].   Orders: T-Wet Prep (in-house) [phone]) T-GC Probe, urine [phone]) T-Urinalysis Dipstick only ([id number]) T-Urine Pregnancy (in -house) ([id number]) T-Chlamydia (62952)   Problem # 2:  LESION, CERVIX (ICD-236.3) No lesion seen.  Colposcopy in  7/08 was negative.  Pap 6/08 was normal.  Anual paps.  Complete Medication List: 1)  Centrum Tabs (Multiple vitamins-minerals) .... Take 1 tablet by mouth once a day 2)  Loratadine 10 Mg Tabs (Loratadine) .... Take 1 tablet by mouth once a day as needed for seasonal allergies 3)  Flonase 50 Mcg/act Susp (Fluticasone propionate) .... Place 2 sprays in each nostril daily   Patient Instructions: 1)  You had some labs collected today.  I will call you with the results tomorrow.    ]  Impression & Recommendations:  Problem # 1:  LEUKORRHEA (ICD-623.5) Has had thick white discharge for >2 weeks, intermittant abdomenal pain (some sharp pains, not constant).  Exam suggestive of yeast infection with white, chunky discharge over vaginal walls.  Abdoemenal exam benign, cervix is non tender, no lesions or pigment visualized. Sent wet prep.  Also send urine G/c, clamydia, and pregnancy.  Will call patient tomorrow with results.  Her phone number is [phone], she would like prescriptions called to [facility] [address].   Orders: T-Wet Prep (in-house) [phone]) T-GC Probe, urine [phone]) T-Urinalysis Dipstick only ([id number]) T-Urine Pregnancy (in -house) ([id number]) T-Chlamydia (75643)    Problem # 2:  LESION, CERVIX (ICD-236.3) No lesion seen.  Colposcopy in 7/08 was negative.  Pap 6/08 was normal.  Anual paps.  Complete Medication List: 1)  Centrum Tabs (Multiple vitamins-minerals) .... Take 1 tablet by mouth once a day 2)  Loratadine 10 Mg Tabs (Loratadine) .... Take 1 tablet by mouth once a day as needed for seasonal allergies 3)  Flonase 50 Mcg/act Susp (Fluticasone propionate) .... Place 2 sprays in each nostril daily   Laboratory Results   Urine Tests  Date/Time Recieved: 03/17/07 1645 Date/Time Reported: 03/17/07 1645  Routine Urinalysis   Glucose: negative   (Normal Range: Negative) Bilirubin: negative   (Normal Range: Negative) Ketone: negative   (Normal Range: Negative) Spec. Gravity: 1.010   (Normal Range: 1.003-1.035) Blood: negative   (Normal Range: Negative) pH: 7.0   (Normal Range: 5.0-8.0) Protein: negative   (Normal Range: Negative) Urobilinogen: 0.2   (Normal Range: 0-1) Nitrite: negative   (Normal Range: Negative) Leukocyte Esterace: negative   (Normal Range: Negative)         Pap Smear  Procedure date:  08/07/2006  Findings:      Pathology normal.  Possible pigmented lesion.  Pt was sent for colposcopy, which was negative.  Procedures Next Due Date:    Pap Smear: 08/2007

## 2010-03-23 NOTE — Assessment & Plan Note (Signed)
Summary: EST-1 MONTH RECHECK/CH   Vital Signs:  Patient profile:   30 year old female Height:      69 inches (175.26 cm) Weight:      258.3 pounds (117.41 kg) BMI:     38.28 Temp:     97.6 degrees F (36.44 degrees C) oral Pulse rate:   69 / minute BP sitting:   100 / 68  (left arm)  Vitals Entered By: Stanton Kidney Ditzler RN (March 09, 2010 9:05 AM) Is Patient Diabetic? No Pain Assessment Patient in pain? yes     Location: left hand Intensity: 4 Type: sharp Onset of pain  long time Nutritional Status BMI of > 30 = obese Nutritional Status Detail appetite good  Have you ever been in a relationship where you felt threatened, hurt or afraid?denies   Does patient need assistance? Functional Status Self care Ambulation Normal Comments Friend with pt. Discuss chol. Needs referral for left hand, back and right knee.   Primary Care Provider:  Whitney Post MD   History of Present Illness: 30yo W with obesity, GERD, low back pain presents for evaluation of: 1. wrist pain/numbness. Pt has hx of pain and numbness in both rights but L >> R. The pain radiates into her thumb and 2nd and 3rd digits and she also frequently feels numb in her thumb and fingers. She wears a splint on her L wrist which helps relieve these symptoms. She has been told in the past that she has carpal tunnel but did not previously have insurance to see an orthopedist. She now has Medicaid and would like referral to orthopedist for treatment.  2. low back pain. This is an ongoing problem for her. Low back pain that sometimes radiates down her L leg and limits her ability to walk. Controls pain with tramadol and ibuprofen. Denies numbness/tingling in lower extremities.  3. increased urinary frequency. Reports increased urinary frequency for the past week or so and lower abdominal pain. Says that this feels similar to when she had a UTI in the past. She reports feeling warm and sweaty -- not sure if she has had fevers.  4.  brusing easily. She reports bruising more easily over the past several weeks. She currently has a large bruise on her R inner thigh from bumping into the couch. She says that she did not used to bruise so easily and would like to know if something is wronge. 5. Obesity. Has lost a few pounds since last visit. Continues to work on diet and exercise, although that is limited by back pain. Wants cholesterol checked.   Depression History:      The patient denies a depressed mood most of the day and a diminished interest in her usual daily activities.         Preventive Screening-Counseling & Management  Alcohol-Tobacco     Alcohol drinks/day: <1     Alcohol type: beer, liquor     Smoking Status: never     Passive Smoke Exposure: yes  Caffeine-Diet-Exercise     Does Patient Exercise: no  Current Medications (verified): 1)  Acid Controller Max St 20 Mg Tabs (Famotidine) .... Take 1 Pill Once Daily At Bed Time 2)  Ultram 50 Mg Tabs (Tramadol Hcl) .... Take One Tab By Mouth Ever 6 Hours As Needed For Your Back Pain. 3)  Naproxen 500 Mg Tabs (Naproxen) .... Take 1 Tablet By Mouth Two Times A Day 4)  Cipro 250 Mg Tabs (Ciprofloxacin Hcl) .... Take 1 Tablet  By Mouth Two Times A Day For 3 Days  Allergies (verified): No Known Drug Allergies  Past History:  Past Medical History: Last updated: 07/30/2007 Basal cell carcinoma, recurrent in left maxillary sinus (dx'ed with basal cell nouveau -> hereditary syndrome) has had 5 surgeries, last one in 2003. Left ovarian fibroma Sinus cyst Recurrent vaginal candidiasis Gardnerella + in 08/08 Hx of herpetic whitlow requiring I+D (was bitten by the autistic child that cares for).  Past Surgical History: Last updated: 07/24/2006 Oophorectomy, left Cholecystectomy Sinus surgery (x2 at age 31 and 51)  Family History: Last updated: 12/28/2009 Uncle stomach cancer aunts x 2 breast ca grandfather unknown CA DM in grandmother, aunt and two uncles.     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 Single Never Smoked  Review of Systems      See HPI General:  Complains of fever and malaise; denies chills, loss of appetite, and weakness. CV:  Denies chest pain or discomfort. Resp:  Denies cough and shortness of breath. GI:  Complains of abdominal pain; denies change in bowel habits. GU:  Complains of urinary frequency; denies dysuria and hematuria. MS:  Complains of joint pain, joint swelling, and low back pain. Neuro:  Complains of numbness and tingling. Heme:  Complains of abnormal bruising.  Physical Exam  General:  alert, cooperative to examination, and overweight-appearing.   Head:  normocephalic and atraumatic.   Eyes:  vision grossly intact, pupils equal, pupils round, and pupils reactive to light.   Mouth:  pharynx pink and moist.   Neck:  supple and no masses.   Lungs:  normal respiratory effort, normal breath sounds, no crackles, and no wheezes.   Heart:  normal rate, regular rhythm, no murmur, no gallop, and no rub.   Abdomen:  soft and normal bowel sounds.  mild suprapubic tenderness.  Msk:  Swelling/tenderness of L wrist. Positive Tinel sign.  Extremities:  No edema.  Neurologic:  alert & oriented X3, cranial nerves grossly intact, strength normal in all extremities, and sensation intact to light touch. Gait slow secondary to back pain. ?Pos straight leg test on L.  Skin:  turgor normal. Large (4-5cm) resolving bruise on L inner thigh.  Psych:  Oriented X3, memory intact for recent and remote, normally interactive, good eye contact, not anxious appearing, and not depressed appearing.     Impression & Recommendations:  Problem # 1:  CARPAL TUNNEL SYNDROME (ICD-354.0) Pain in L wrist/hand relieved somewhat by wrist splint but I think she may benefit from  carpal tunnel release. Will refer to orthopedics.   Problem # 2:  BACK PAIN, CHRONIC (ICD-724.5) Continue current pain management. WIll refer to orthopedics.   Problem # 3:  FREQUENCY, URINARY (ICD-788.41) Will check UA and urine culture. However, given similarity to symptoms from past UTI, will empirically treat with 3 day course of cipro at patient request.   Problem # 4:  OBESITY (ICD-278.00) Reinforced patient's weight loss efforts and congratulated her on the nearly 7 pounds lost since the last visit. WIll check cholesterol, CMET.   Problem # 5:  BRUISE (ICD-924.9) Because of patient's reported history of bruising more easily, will check CBC.   Complete Medication List: 1)  Acid Controller Max St 20 Mg Tabs (Famotidine) .... Take 1 pill once daily at bed time 2)  Ultram 50 Mg Tabs (Tramadol hcl) .Marland KitchenMarland KitchenMarland Kitchen  Take one tab by mouth ever 6 hours as needed for your back pain. 3)  Naproxen 500 Mg Tabs (Naproxen) .... Take 1 tablet by mouth two times a day 4)  Cipro 250 Mg Tabs (Ciprofloxacin hcl) .... Take 1 tablet by mouth two times a day for 3 days  Other Orders: T-CBC No Diff ([id number]) T-Urinalysis ([id number]) T-Culture, Urine ([id number]) T-Comprehensive Metabolic Panel ([id number]) T-Lipid Profile ([id number]) T-HIV Antibody  (Reflex) ([id number])  Patient Instructions: 1)  Please schedule a follow-up appointment in 6 months. 2)  We will refer you to an orthopedist for your carpal tunnel.  Prescriptions: CIPRO 250 MG TABS (CIPROFLOXACIN HCL) Take 1 tablet by mouth two times a day for 3 days  #6 x 0   Entered and Authorized by:   Whitney Post MD   Signed by:   Whitney Post MD on 03/09/2010   Method used:   Electronically to        [facility] (retail)       [address], [address]       [place], [place]  [address]       Ph: [phone]       Fax: [phone]   RxID:   [id number]    Orders Added: 1)  T-CBC No Diff  [[id number]] 2)  T-Urinalysis [81003-65000] 3)  T-Culture, Urine [[id number]] 4)  T-Comprehensive Metabolic Panel [[id number]] 5)  T-Lipid Profile [[id number]] 6)  T-HIV Antibody  (Reflex) [[id number]] 7)  Est. Patient Level IV [70350]   Process Orders Check Orders Results:     [facility]: ABN not required for this insurance Tests Sent for requisitioning (March 09, 2010 10:22 AM):     03/09/2010: [facility] -- T-CBC No Diff [[id number]] (signed)     03/09/2010: [facility] -- T-Urinalysis [81003-65000] (signed)     03/09/2010: [facility] -- T-Culture, Urine [[id number]] (signed)     03/09/2010: [facility] -- T-Comprehensive Metabolic Panel [[id number]] (signed)     03/09/2010: [facility] -- T-Lipid Profile [id number] (signed)     03/09/2010: [facility] -- T-HIV Antibody  (Reflex) [[id number]] (signed)     Prevention & Chronic Care Immunizations   Influenza vaccine: Not documented   Influenza vaccine deferral: Deferred  (06/08/2009)    Tetanus booster: Not documented   Td booster deferral: Deferred  (06/08/2009)    Pneumococcal vaccine: Not documented  Other Screening   Pap smear: NEGATIVE FOR INTRAEPITHELIAL LESIONS OR MALIGNANCY.  (09/21/2009)   Pap smear due: 08/2007   Smoking status: never  (03/09/2010)      Resource handout printed.  Process Orders Check Orders Results:     [facility]: ABN not required for this insurance Tests Sent for requisitioning (March 09, 2010 10:22 AM):     03/09/2010: [facility] -- T-CBC No Diff [[id number]] (signed)     03/09/2010: [facility] -- T-Urinalysis [81003-65000] (signed)     03/09/2010: [facility] -- T-Culture, Urine [[id number]] (signed)     03/09/2010: [facility] -- T-Comprehensive Metabolic Panel  [[id number]] (signed)     03/09/2010: [facility] -- T-Lipid Profile [phone] (signed)     03/09/2010: [facility] -- T-HIV Antibody  (Reflex) [[id number]] (signed)

## 2010-03-23 NOTE — Assessment & Plan Note (Signed)
Summary: ACUTE-SHOULDER PAIN(TENDONTITIS)DUGUAY/CFB   Vital Signs:  Patient Profile:   30 Years Old Female Height:     69 inches Weight:      211.8 pounds Temp:     98.2 degrees F Pulse rate:   66 / minute BP sitting:   92 / 61  (left arm)  Pt. in pain?   yes    Location:   shoulders    Intensity:   3    Type:       burning  Vitals Entered By: Henderson Cloud (March 04, 2006 3:24 PM)              Is Patient Diabetic? No Nutritional Status Normal  Have you ever been in a relationship where you felt threatened, hurt or afraid?Unable to ask  Domestic Violence Intervention Father present in room  Does patient need assistance? Functional Status Self care Ambulation Normal      PCP:  Yvonne Kendall, MD  Chief Complaint:  shoulder, back, hand pain, feeling sluggish and tired, and weight gain (appx 20 lbs in last month).  History of Present Illness: Jordan [name] is a 25-yo woman with at history of basal cell carcinoma and left ovarian fibroma, who presents today complaining of bilateral shoulder pain over the last 6 months.  She feels that it began when the school semester started in the fall, likely due to carrying a heavy bookbag.  The pain is worse on the left side, and varies in intensity from 3/10 to 10/10.  She describes the pain as a buring and throbbing that occurs with resting and while moving.  Recently, the patient stopped using a backpack and has had a slight improvement in her pain.  Additionally, she has experienced some relief with ibuprofen used every 6 hours.  Of note, the patient reports as previous episode similar to this in her childhood.  At that time, she was diagnosed with tendonitis and treated with a sling and NSAID's.  Acute Visit History: Pertinent previous surgeries include cholecystectomy.        Current Allergies (reviewed today): ASPIRIN  Past Medical History:    Basal cell carcinoma    Left ovarian fibroma    Sinus cyst   Past Surgical History:    Oophorectomy, left    Cholecystectomy    Risk Factors:  Tobacco use:  never   Physical Exam  General:     Patient is a well-developed, well nourished, female, in no acute distress. Neck:     Supple, without LAD or thyromegaly Lungs:     Clear to auscultation bilaterally Heart:     RRR without m/r/g Msk:     Patient has normal range of motion in her shoulders bilaterally.  No swelling, warmth or redness is noted.  No crepitation is appreciated.  Mild tenderness over the left coracoid and bilateral pectoralis major muscles is present.  Impingement sign is negative bilaterally.  The elbow joints are normal bilaterally     Impression & Recommendations:  Problem # 1:  TENDINITIS, SHOULDER (ICD-726.10) Based on the patient's symptoms and today's physical exam, I feel that here symptoms are most consistent with tendonitis of the shoulders and pectoralis tendons.  I have counselled the patient on good posture, as well as refraining from carrying a backpack.  Additionally, I have asked her to ice her shoulders daily to reduce inflammation.  She was also given a prescription for diclofenac 75 mg to be taken two times a day for 2 weeks.  At  that time, she will return to the clinic for re-evaluation.  Medications Added to Medication List This Visit: 1)  Centrum Tabs (Multiple vitamins-minerals) .... Take 1 tablet by mouth once a day 2)  Diclofenac Sodium 75 Mg Tbec (Diclofenac sodium) .... Take 1 tablet by mouth two times a day for 14 days      Patient Instructions: 1)  Please schedule a follow-up appointment in 2 weeks for reassessment of your shoulder. 2)  Additionally, we will plan to perform a Pap smear at that time.

## 2010-03-23 NOTE — Progress Notes (Signed)
Summary: IV start, transfer report  Report to Nurse on 6700.  Saline lock in left hand.  Pt to 6704. ..................................................................Marland KitchenAngelina Ok RN  June 05, 2007 12:46 PM  1230  saline  lock started with # 22 gauge catheter to left hand. Earney Hamburg RN

## 2010-03-23 NOTE — Progress Notes (Signed)
Summary: sinus drainage/ hla  Phone Note Call from Patient   Summary of Call: pt states she stopped zyrtec and sinus drainage started again, should she take zyrtec again? told if she wants to try otc that will be fine, or will schedule appt, she says she thinks she will try zyrtec again first Initial call taken by: Marin Roberts RN,  June 15, 2009 5:22 PM

## 2010-03-23 NOTE — Assessment & Plan Note (Signed)
Summary: FU/[facility]/VS   Vital Signs:  Patient profile:   30 year old female Height:      69 inches (175.26 cm) Weight:      247.2 pounds (112.36 kg) BMI:     36.64 Temp:     98.8 degrees F (37.11 degrees C) oral Pulse rate:   76 / minute BP sitting:   109 / 68  (right arm) Cuff size:   large  Vitals Entered By: Theotis Barrio NT II (May 05, 2008 2:08 PM) CC: Since 1/10 - burning and stabbing feeling lower abd. Problems with constipation. Sharp pain in left hand x 2 months. Is Patient Diabetic? No Pain Assessment Patient in pain? yes     Location: back Intensity:    3 Type: dull / ACHE Nutritional Status BMI of > 30 = obese  Have you ever been in a relationship where you felt threatened, hurt or afraid?No   Does patient need assistance? Functional Status Self care Ambulation Normal Comments BACK PAIN  / FOLLOW UP ON ULTRA SOUND     Primary Care Provider:  [name] MD  CC:  Since 1/10 - burning and stabbing feeling lower abd. Problems with constipation. Sharp pain in left hand x 2 months..  History of Present Illness: This is a  30  y/o woman with PMH of   Basal cell carcinoma, recurrent in left maxillary sinus (dx'ed with basal cell nouveau -> hereditary syndrome) has had 5 surgeries, last one in 2003. Left ovarian fibroma Sinus cyst Recurrent vaginal candidiasis Gardnerella + in 08/08 Hx of herpetic whitlow requiring I+D (was bitten by the autistic child that cares for).  presenting for 1-mo followup for her pelvic pain. She has been seen by Gyn and also had U/S showing right ovarian cysts (left ovary absent). She was recommended Advil and Tylenol for pain and f/u U/s in 6 weeks from appt - on 03/30, then back to [facility] 05/2008. She still has the pain, stabbing.  She has yeast infection, has vaginal d/c starting last week and also itching.   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 Her niece is going through [employer] for highschool. She tells me she does not want children - her niece is like her child and does not want more.    Preventive Screening-Counseling & Management     Alcohol type: beer, liquor     Smoking Status: never     Passive Smoke Exposure: yes     Does Patient Exercise: no  Current Medications (verified): 1)  Skelaxin 800 Mg Tabs (Metaxalone) .... Take One Tablet Every 12 Hours For Back Pain. 2)  Acid Controller Max St 20 Mg Tabs (Famotidine) .... Take 1 Pill Once Daily At Bed Time 3)  Etodolac 400 Mg Tabs (Etodolac) .... Two Times A Day With Food 4)  Tramadol Hcl 50 Mg Tabs (Tramadol Hcl) .... Take 1 -2 Tablets By Mouth Three Times A Day As Needed For Back Pain  Allergies: No Known Drug Allergies  Review of Systems       per HPI  Physical Exam  General:  alert and well-developed.   Lungs:  normal respiratory effort, no crackles, and no wheezes.   Heart:  normal rate, regular rhythm, no murmur, and no gallop.   Abdomen:  soft, non-tender, and normal bowel sounds.  Pain at palpation right suprapubic area.    Impression & Recommendations:  Problem # 1:  OVARIAN CYST, RIGHT (ICD-620.2) Assessment Unchanged Per Gyn - the pain  still bothers her. Will have another U/S at the end of the month. Tylenol, Motrin, Ultram for pain.  Problem # 2:  CANDIDIASIS, VULVOVAGINAL, RECURRENT (ICD-112.1) Assessment: Deteriorated  Pt. with recurrent vaginal candidosis. Will treat with extended therapy (per UTD recomm): Fluconazole 1 a day for 2 weeks and 1 a week for 6 mo. If recurs, might need to try the vaginal extended tx.  Of note she had HIV and diabestes testing in the past.  Her updated medication list for this problem includes:    Fluconazole 150 Mg Tabs (Fluconazole) .Marland Kitchen... Take 1 tablet by mouth once a day for 2 weeks, then once a week for 6 months  Complete Medication List:  1)  Skelaxin 800 Mg Tabs (Metaxalone) .... Take one tablet every 12 hours for back pain. 2)  Acid Controller Max St 20 Mg Tabs (Famotidine) .... Take 1 pill once daily at bed time 3)  Etodolac 400 Mg Tabs (Etodolac) .... Two times a day with food 4)  Tramadol Hcl 50 Mg Tabs (Tramadol hcl) .... Take 1 -2 tablets by mouth three times a day as needed for back pain 5)  Fluconazole 150 Mg Tabs (Fluconazole) .... Take 1 tablet by mouth once a day for 2 weeks, then once a week for 6 months  Patient Instructions: 1)  Please schedule a follow-up appointment in 6 months. Prescriptions: FLUCONAZOLE 150 MG TABS (FLUCONAZOLE) Take 1 tablet by mouth once a day for 2 weeks, then once a week for 6 months  #25 x 1   Entered and Authorized by:   Carlus Pavlov MD   Signed by:   Carlus Pavlov MD on 05/05/2008   Method used:   Electronically to        [facility] [phone]* (retail)       [address]       [place], [place]  [address]       Ph: [phone]       Fax: [phone]   RxID:   [id number] FLUCONAZOLE 150 MG TABS (FLUCONAZOLE) Take 1 tablet by mouth once a day for 2 weeks, then once a week for 6 months  #25 x 1   Entered and Authorized by:   Carlus Pavlov MD   Signed by:   Carlus Pavlov MD on 05/05/2008   Method used:   Electronically to        [facility]  [address] [phone]* (retail)       [address]       [place], [place]  [address]       Ph: [phone] or [phone]       Fax: [phone]   RxID:   [id number] FLUCONAZOLE 150 MG TABS (FLUCONAZOLE) Take 1 tablet by mouth once a day for 2 weeks, then once a week for 6 months  #25 x 1   Entered and Authorized by:   Carlus Pavlov MD   Signed by:   Carlus Pavlov MD on 05/05/2008   Method used:   Print then Give to Patient   RxID:   [id number]  Sent to wrong pharmacy first. Carlus Pavlov MD  May 05, 2008 2:58 PM

## 2010-03-23 NOTE — Assessment & Plan Note (Signed)
Summary: VAGINAL DISCHARGE([facility])/DS   Vital Signs:  Patient Profile:   30 Years Old Female Height:     69 inches (175.26 cm) Weight:      217.6 pounds (98.91 kg) BMI:     32.25 Temp:     97.5 degrees F (36.39 degrees C) oral Pulse rate:   76 / minute BP sitting:   93 / 64  (right arm)  Pt. in pain?   no  Vitals Entered By: Stanton Kidney Ditzler RN (July 30, 2007 9:51 AM)              Is Patient Diabetic? No Nutritional Status BMI of > 30 = obese Nutritional Status Detail good  Have you ever been in a relationship where you felt threatened, hurt or afraid?denies   Does patient need assistance? Functional Status Self care Ambulation Normal     PCP:  [name] MD  Chief Complaint:  White vad discharge with odor and outside itching. ?bleeding with wiping.Marland Kitchen  History of Present Illness: Jordan [name] is a 30 y/o woman with a hx of herpetic whitlow and frequent vaginal infx who presents to the opc with c/o white vaginal discharge.  About three days ago, she noticed a change in her vaginal discharge: it became thicker and white and she did notice an odor to it as well. This AM, she noticed some minimal red blood on the paper when she wiped.  Not sexually active in over a year. Was last treated for vaginal candidiasis in 01/09. I treated her for BV (and candida) in 08/08.    Prior Medication List:  CENTRUM  TABS (MULTIPLE VITAMINS-MINERALS) Take 1 tablet by mouth once a day TYLENOL/CODEINE #3 300-30 MG  TABS (ACETAMINOPHEN-CODEINE) Take one every 4-6 hours as needed for pain   Current Allergies (reviewed today): No known allergies   Past Medical History:    Basal cell carcinoma, recurrent in left maxillary sinus (dx'ed with basal cell nouveau -> hereditary syndrome) has had 5 surgeries, last one in 2003.    Left ovarian fibroma    Sinus cyst    Recurrent vaginal candidiasis    Gardnerella + in 08/08     Hx of herpetic whitlow requiring I+D (was bitten by the autistic child that cares for).   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    Single    Never Smoked   Risk Factors: Tobacco use:  never Passive smoke exposure:  yes Drug use:  no HIV high-risk behavior:  no Alcohol use:  yes    Type:  beer, liquor Exercise:  no Seatbelt use:  100 %  PAP Smear History:    Date of Last PAP Smear:  08/07/2006    Physical Exam  General:     alert, well-developed, well-nourished, and well-hydrated.  Young woman in NAD. Abdomen:     soft, non-tender, normal bowel sounds, and no masses.   Genitalia:     normal introitus and no external lesions.   Abundant white to yellow thick discharge w/o odor. Mucosa bright red w/o bleed. No vaginal or cervical lesions. Cervix mildly friable.    Impression & Recommendations:  Problem # 1:  VAGINITIS (ICD-616.10) Pt did have BV treated in 08/08 at which time she also had Candida. Was treated again for Candida in 01/09. No sexual activity in over a year makes BV less likely. Was tested for GC/chlam at least twice since she was last sexually active. Will empirically treat  or Candida with fluconazole 150 mg by mouth x 1. Will give 4 refills so that she can treat herself when she feels the sx coming on. Gave her a booklet printed from [employer]: Patient information on Vaginal yeast infections. Will call her with the results of the wet prep.  Orders: T-Wet Prep (in-house) [phone])   Problem # 2:  POSTHERPETIC NEURALGIA (ICD-053.19) Pt still has pain shooting down her R 3rd finger where she had herpetic whitlow leading to an abscess. Has not been able to afford the Tylenol #3; gets paid on the 19th of June.  Problem # 3:  GYNECOLOGICAL EXAMINATION, ROUTINE (ICD-V72.31) Pt was due for a Pap smear.  Orders: T-Pap Smear, Thin Prep  ([id number])   Complete Medication List:  1)  Centrum Tabs (Multiple vitamins-minerals) .... Take 1 tablet by mouth once a day 2)  Tylenol/codeine #3 300-30 Mg Tabs (Acetaminophen-codeine) .... Take one every 4-6 hours as needed for pain 3)  Fluconazole 150 Mg Tabs (Fluconazole) .... Take one tab by mouth now   Patient Instructions: 1)  I will call you with the results of your Pap smear and culture.   Prescriptions: FLUCONAZOLE 150 MG  TABS (FLUCONAZOLE) Take one tab by mouth now  #1 x 4   Entered and Authorized by:   Olene Craven MD   Signed by:   Olene Craven MD on 07/30/2007   Method used:   Print then Give to Patient   RxID:   [id number]  ]

## 2010-03-23 NOTE — Progress Notes (Signed)
Summary: f/u R middl finger/ hla  Phone Note Call from Patient   Caller: Patient Reason for Call: Acute Illness Summary of Call: pt calls states her R middle finger is completely swollen, refuses afternoon appt of 4/15, asks for 4/16 am appt, given  for 0945 4/16 Initial call taken by: Marin Roberts RN,  June 04, 2007 9:27 AM

## 2010-03-23 NOTE — Miscellaneous (Signed)
Summary: HIPAA Restrictions  HIPAA Restrictions   Imported By: Florinda Marker 04/02/2008 16:18:44  _____________________________________________________________________  External Attachment:    Type:   Image     Comment:   External Document
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
## 2010-03-23 NOTE — Assessment & Plan Note (Signed)
Summary: EXTREME M/O CLASS/CH   Allergies: No Known Drug Allergies  Patient attended a 1 hour [employer]: Lifestyle meeting today. The meeting included information about: cooking- a food demo, healthy eating to lower cholesterol and incorporating physical activity into lifestyle.   Please ask patient what they learned at their next visit.   Thank you for the referral.    Orders Added: 1)  No Charge Patient Arrived ([id number]) [[id number]]
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
## 2010-03-23 NOTE — Assessment & Plan Note (Signed)
Summary: EST-PAP SMEAR YEARY PHYSICAL/CFB   Vital Signs:  Patient Profile:   30 Years Old Female Height:     69 inches Weight:      214.7 pounds BMI:     31.82 Temp:     98.5 degrees F oral Pulse rate:   71 / minute Pulse (ortho):   80 / minute BP sitting:   95 / 63  (left arm) BP standing:   90 / 64  Pt. in pain?   no  Vitals Entered By: Filomena Jungling (July 24, 2006 4:01 PM)              Is Patient Diabetic? No   PCP:  Yvonne Kendall, MD  Chief Complaint:  SINUS PAIN.  History of Present Illness: Jordan [name] is a 25-yo woman with at history of basal cell carcinoma and left ovarian fibroma, who is seen today because of bilateral sinus pain, clear rhinorrhea, itchy and watery eyes, coryza, and a scratchy throat.  She denies having fevers, chills, shortness of breath, and sick contacts.  However, she does feel like her throat becomes dry at times and leads to mild upper airway wheezing, particularly after having been outside for an extended period of time.  The pt does not have a history of seasonal allergies, though she recently moved to a different part [place] [facility].  Of note, the pt has a history of recurent right maxillary sinus cysts and basal cell carcinoma secondary to basal cell nouveau (congenital condition predisposing to basal cell carcinomas), which is followed by Dr. Mauri Pole at [facility].  Her next appointment is due in July of this year.  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      Denies blurring, double vision, and eye pain.      Pt seeing spots recently, which pt describes as flashes of lightt.  Usually occur when pt is on the go, but are not related to any particular activity or time of day.  ENT      Complains of nasal congestion, postnasal drainage, sinus pressure, and sore throat.      Denies ear discharge and earache.  CV      Denies chest pain or discomfort.  Resp      Complains of wheezing.      Denies chest discomfort, cough, and shortness of breath.      Upper airway wheezing  GI      Denies abdominal pain, change in bowel habits, constipation, diarrhea, nausea, and vomiting.  GU      Denies abnormal vaginal bleeding and discharge.      LMP: 07/07/06   Physical Exam  General:     Obese women accompanied by her mother.  She is seated comfortably in exam room. Head:     Bilateral maxillary tenderness to percussion and palpation (L>R).  No frontal sinus tenderness. Eyes:     Minimal conjunctival injection without discharge.  PERRLA, EOMI. Fundoscopy is unremarkable. Ears:     Minimal cerumen in EAC's bilaterally.  Otherwise EAC's and TM are normal in appearance. Nose:     Mildly hyperemic nasal mucosa without lesions.  Slight amount o clear rhinorrhea is present. Mouth:     MMM.  Fair dentition.  OP is mildly erythematous with post-nasal drip and coblestoning. Neck:     Supple without lymphadenopathy or thyromegaly. Lungs:     Clear to auscultation bilaterally with good air movement. Heart:     Regular rate and rhythm without murmurs, rubs, or gallops. Abdomen:     Normal, active bowel sounds.  Abdomen is round, soft, non-tender, and non-distended. Genitalia:      Pt has normal appearing external female genitalia.  Vaginal mucosa is pink with a small amount of white discharge.  The cervix is mildly erythematous and has several small punctate areas of hypopigmentation, primarily at 1 and 6 o'clock.  These cannot be removed with a swab.  Pap smear and GC/chlamydia were performed.  Bimanual exam reveals a normal uterus without adnexal masses or tenderness.  No cervical motion tenderness is present. Pulses:     Pt has 2+ radial, PT and DP pulses bilaterally. Extremities:     No c/c/e are present. Neurologic:     Pt is A & O x 3.  CN 2-12 are intact.  Pt has 5/5 strength in her UE and LE bilaterally.  DTR's are 1+ and symmetric in her UE and LE.  Fine-touch sensation is grossly intact.  Pt has normal gait and balance. Cervical Nodes:     No anterior cervical adenopathy and no posterior cervical adenopathy are appreciated.    Serial Vital Signs/Assessments:  Time      Position  BP       Pulse  Resp  Temp     By 4:20      Lying LA  100/64   70                    Jordan Wilford MD 4:20      Sitting   90/64    66                    Jordan Styles MD 4:20      Standing  90/64    80                    Jordan Deer Kolbie Lepkowski MD    Impression & Recommendations:  Problem # 1:  ALLERGIC RHINITIS (ICD-477.9) Assessment: New The pt describes sx consistent with seasonal allergies and allergic rhinitis.  Though she has a h/o cysts and basal cell carcinoma in her maxillary sinuses, her current sx are not consistent with this.  The pt was started on empiric tx, including as needed loratadine and Flonase.  She is to contact the clinic if her sx do not improve in 1-2 weeks, or if she develops worsening sinus pain, facial swelling, or epistaxis.  The pt is scheduled to see Dr. Mauri Pole (ENT) next month for routine f/u.  Her updated medication list for this problem includes:     Loratadine 10 Mg Tabs (Loratadine) .Marland Kitchen... Take 1 tablet by mouth once a day as needed for seasonal allergies  Flonase 50 Mcg/act Susp (Fluticasone propionate) .Marland Kitchen... Place 2 sprays in each nostril daily   Problem # 2:  DISTURBANCE, VISUAL NOS (ICD-368.9) Assessment: New Though Ms. Goodhart does not have a h/o eye problems, she now reports several episodes of flashing lights in both of her eyes.  Though she feels slightly dizzy when this occurs, she does not have any other accompanying symptoms.  Her blood pressure today is borderline low, though in the normal range for a young women.  Additionally, she does not have evidence of orthostatic hypotension.  I suspect that the pt is experiencing benign flashers and floaters.  However, I will arrange for referral to an ophthomologist for a more thorough evaluation to r/o more serious pathology.  Orders: Ophthalmology Referral (Ophthalmology)   Problem # 3:  Gynecological examination-routine (ICD-V72.31) The pt has not had routine Gyn care for several years.  She is currently not sexually active, and has used condoms faithfully in the past.  A Pap smear and GC/chlamydia probe were obtained today.  Further findings during the speculum exam are addressed below.  Problem # 4:  LESION, CERVIX (ICD-236.3) Assessment: New Speculum examination today revealed multiple small hypopigmented spots on the ectocervix, which could represent cervical dysplasia or early neoplasm.  As noted above, a thin-prep Pap smear was done today.  However, given these findings, the pt will need further Gyn evaluation regardless of the Pap smear results.  Referral to the [facility] Gyn clinic will be arranged.  Medications Added to Medication List This Visit: 1)  Loratadine 10 Mg Tabs (Loratadine) .... Take 1 tablet by mouth once a day as needed for seasonal allergies 2)  Flonase 50 Mcg/act Susp (Fluticasone propionate) .... Place 2 sprays in each nostril daily    Patient Instructions: 1)  Please schedule a follow-up appointment in 1 year, or sooner if needed. 2)  Keep your regularly scheduled appointment with Dr. Mauri Pole in [place].  Please call our clinic if you develop worsening sinus pain, facial swelling, or nose bleeding. 3)  Use loratadine (Claratin), which is over the counter, once daily as needed for allergy symptoms. 4)  Use Flonase daily for at least 2 weeks in order for your nasal and sinus symptoms to improve. 5)  Stay well-hydrated, especially when exercising or doing outdoor activities. 6)  Today, several small spots were seen on your cervix.  These will need to be examined more closely.  We will arrange for you to be seen at the [facility] gynecology clinic. 7)  We will also arrange for you to be seen by an ophthomologist (eye doctor).

## 2010-03-23 NOTE — Assessment & Plan Note (Signed)
Summary: sore throat & sore Breast about month[mkj]   Vital Signs:  Patient profile:   30 year old female Height:      69 inches (175.26 cm) Weight:      260.0 pounds (118.18 kg) BMI:     38.53 Temp:     97.8 degrees F (36.56 degrees C) oral Pulse rate:   85 / minute BP sitting:   112 / 74  (right arm)  Vitals Entered By: Stanton Kidney Ditzler RN (Jul 12, 2008 9:01 AM) Is Patient Diabetic? No Pain Assessment Patient in pain? yes     Location: back Intensity: 3 Onset of pain  long time Nutritional Status BMI of > 30 = obese Nutritional Status Detail appetite good  Have you ever been in a relationship where you felt threatened, hurt or afraid?denies   Does patient need assistance? Functional Status Self care Ambulation Normal Comments Past 2 months - right breast sore and tender. Larger in size. LMP 07/05/08. Head congestion with nonproductive cough.   Primary Care Provider:  [name] MD   History of Present Illness: 30 y/o with PMH of  Basal cell carcinoma, recurrent in left maxillary sinus (dx'ed with basal cell nouveau -> hereditary syndrome) has had 5 surgeries, last one in 2003, Left ovarian fibroma, Hx of herpetic whitlow requiring I+D (was bitten by the autistic child that cares) for comes in for  right painful breast. Pain for 2 weeks, 1st time this has haapens, she relates it has gotten bigger over the past 2 months. internittent,No rashes, no trauma, no discharge from her nipple, no masses. also painful axilla, no redness on her breast. LMP 5/17, no neck pain. It affect the daily activity when she has it.   Preventive Screening-Counseling & Management     Alcohol drinks/day: <1     Alcohol type: beer, liquor     Smoking Status: never     Passive Smoke Exposure: yes     Does Patient Exercise: no  Medications Prior to Update: 1)  Methocarbamol 500 Mg Tabs (Methocarbamol) .... Take 1 Tablet By Mouth Three Times A Day As Needed  2)  Acid Controller Max St 20 Mg Tabs (Famotidine) .... Take 1 Pill Once Daily At Bed Time 3)  Etodolac 400 Mg Tabs (Etodolac) .... Two Times A Day With Food 4)  Tramadol Hcl 50 Mg Tabs (Tramadol Hcl) .... Take 1 -2 Tablets By Mouth Three Times A Day As Needed For Back Pain  Allergies: No Known Drug Allergies  Review of Systems  The patient denies anorexia, fever, weight loss, weight gain, vision loss, genital sores, abnormal bleeding, angioedema, and breast masses.    Physical Exam  General:  Well-developed,well-nourished,in no acute distress; alert,appropriate and cooperative throughout examination Breasts:  Right breast larger than left, tender to palaption on at 1000 and 600 no redness o nipple discharge.  no redness and the right nipple is retrated. no edema or scar.s Lungs:  Normal respiratory effort, chest expands symmetrically. Lungs are clear to auscultation, no crackles or wheezes. Heart:  Normal rate and regular rhythm. S1 and S2 normal without gallop, murmur, click, rub or other extra sounds. Abdomen:  Bowel sounds positive,abdomen soft and non-tender without masses, organomegaly or hernias noted. Skin:  turgor normal, no rashes, no suspicious lesions, no ecchymoses, no petechiae, and no edema.   Cervical Nodes:  No lymphadenopathy noted Axillary Nodes:  No palpable lymphadenopathy Inguinal Nodes:  No significant adenopathy   Impression & Recommendations:  Problem # 1:  MASTALGIA (  ICD-611.71) Concern for an inflamatory process of the breast. I will refer her to the breast center for u/s of her breast. I will also get a urine pregnancy test.  DDx; Infectious, inflamatory breast Ca but no signs of peu' orange, warmth, redness, ect..., she is 30 y/o, unlikely trauma unlikely b/o her history,  ductal ectasia, muscular pain no recent or different excericse, no signs of costocondritis, also cervical disease but unlikely no shooting pain.  I will see her in 2 weeks for this. She is to apply ice and tylenol for pain.    Orders: Radiology Referral (Radiology) T-Urine Pregnancy (in -house) [phone])  Complete Medication List: 1)  Methocarbamol 500 Mg Tabs (Methocarbamol) .... Take 1 tablet by mouth three times a day as needed 2)  Acid Controller Max St 20 Mg Tabs (Famotidine) .... Take 1 pill once daily at bed time 3)  Etodolac 400 Mg Tabs (Etodolac) .... Two times a day with food 4)  Tramadol Hcl 50 Mg Tabs (Tramadol hcl) .... Take 1 -2 tablets by mouth three times a day as needed for back pain  Patient Instructions: 1)  Please schedule an appointment with your primary doctor in : 2 weeks  Laboratory Results   Urine Tests  Date/Time Received: Jul 12, 2008 10:23 AM. Date/Time Reported: [name]  Jul 12, 2008 10:23 AM    Urine HCG: negative Comments: specific gravity 1.026   [name]  Jul 12, 2008 10:23 AM

## 2010-03-23 NOTE — Progress Notes (Signed)
Summary: phone/gg  Phone Note Call from Patient   Caller: Patient Summary of Call: Pt called and would like you to call her with  results of CT ABD.  Pt # [id number] Initial call taken by: Merrie Roof RN,  January 02, 2010 10:21 AM  Follow-up for Phone Call        Drs Odis Luster and Bowen are out. Called pt and informed her of the results and request for [place] in 6-12 weeks. Pt understands and has F/U latter this month with Dr Odis Luster. Follow-up by: Blanch Media MD,  January 02, 2010 2:53 PM

## 2010-03-23 NOTE — Assessment & Plan Note (Signed)
Summary: VAG. DISCOMFORT/ SB.   Vital Signs:  Patient Profile:   30 Years Old Female Height:     69 inches (175.26 cm) Weight:      224.2 pounds (101.91 kg) BMI:     33.23 Temp:     98.3 degrees F (36.83 degrees C) oral Pulse rate:   71 / minute BP sitting:   93 / 59  (right arm)  Pt. in pain?   yes    Location:   lower back    Intensity:   2  Vitals Entered By: Stanton Kidney Ditzler RN (October 02, 2006 2:38 PM)              Is Patient Diabetic? No Nutritional Status BMI of > 30 = obese Nutritional Status Detail good  Have you ever been in a relationship where you felt threatened, hurt or afraid?denies   Does patient need assistance? Functional Status Self care Ambulation Normal   PCP:  Yvonne Kendall, MD  Chief Complaint:  C/o of clear vag discharge with odor and ext vag itching. Noted blood with wiping. Ck finger right hand- burned on oven 09/12/06 went to ER.Marland Kitchen  History of Present Illness: Pt is a 30 yo woman with a history of GERD and h/a who comes in to the clinic with c/o vaginal discharge. She noticed a clear discharge x 3 days with a foul smell. Had her period recently (just ended: from the 3rd to 8th). First episode of such. Believes she might have had vaginosis? in past. Her last Pap smear (done here) was abnormal so she had a colposcopy done on 7/0 and she was told that she doesn't need to f/u with them. Pt was last sexually active 5-6 months ago. No recent antibiotics.  Current Allergies (reviewed today): ASPIRIN    Risk Factors: Tobacco use:  never Passive smoke exposure:  yes Drug use:  no HIV high-risk behavior:  no  PAP Smear History:    Date of Last PAP Smear:  08/29/2004   Review of Systems  GI      Denies abdominal pain, bloody stools, change in bowel habits, constipation, dark tarry stools, diarrhea, nausea, and vomiting.  GU      Denies dysuria, hematuria, incontinence, and urinary frequency.   Physical Exam  General:      alert, well-developed, well-nourished, and well-hydrated. Very tall young woman. Head:     Wearing a scarf on head. Atraumatic. Eyes:     vision grossly intact, pupils equal, pupils round, and pupils reactive to light.  anicteric. Mouth:     OP clear, MMM. Neck:     Supple, no lymphadnp/tm Lungs:     CTAB with good air mvt Heart:     RRR, no m/r/g Abdomen:     BS+, soft, NT, ND.  Genitalia:     normal introitus, no external lesions, mucosa pink and moist, no vaginal or cervical lesions, no vaginal atrophy, no friaility or hemorrhage, normal uterus size and position, no adnexal masses or tenderness, and vaginal discharge.  Discharge clear/white, thick, moderately abundant,  without foul odour. Pulses:     2+ posterior tibial pulses Extremities:     No edema/cyanosis or clubbing. Neurologic:     alert & oriented X3, cranial nerves II-XII intact, and strength normal in all extremities.  Antalgic gait (chronic back pain)    Impression & Recommendations:  Problem # 1:  LEUKORRHEA (ICD-623.5) Given her lack of high risk sexual activity, the most likely causes of her leukorrhea  are vaginitis and bacterial vaginosis. Pt unerstands difference. Have provided her with a 2wk prescription for Flagyl which she will take only if I call her and tell her to do so (if vaginosis). Will call her tomorrow with results.  Orders: T-Wet Prep (in-house) ([id number])   Complete Medication List: 1)  Centrum Tabs (Multiple vitamins-minerals) .... Take 1 tablet by mouth once a day 2)  Loratadine 10 Mg Tabs (Loratadine) .... Take 1 tablet by mouth once a day as needed for seasonal allergies 3)  Flonase 50 Mcg/act Susp (Fluticasone propionate) .... Place 2 sprays in each nostril daily 4)  Metronidazole 500 Mg Tabs (Metronidazole) .... Take 1 tablet by mouth two times a day   Patient Instructions: 1)  I will call you tomorrow to let you know if you need to take the script that I gave you.     Prescriptions: METRONIDAZOLE 500 MG  TABS (METRONIDAZOLE) Take 1 tablet by mouth two times a day  #14 x 0   Entered and Authorized by:   Olene Craven MD   Signed by:   Olene Craven MD on 10/02/2006   Method used:   Print then Give to Patient   RxID:   [id number]

## 2010-03-23 NOTE — Assessment & Plan Note (Signed)
Summary: est-ck/fu/meds/cfb   Vital Signs:  Patient profile:   30 year old female Height:      69 inches (175.26 cm) Weight:      273.3 pounds (124.23 kg) BMI:     40.51 Temp:     97.9 degrees F oral Pulse rate:   82 / minute BP sitting:   104 / 67  (right arm) Cuff size:   large  Vitals Entered By: Chinita Pester RN (May 24, 2009 9:56 AM) CC: Bilateral knee pain also sole of feet. Also vaginal discharge. Is Patient Diabetic? No Pain Assessment Patient in pain? yes     Location: knees Intensity: 2/3 Type: aching Onset of pain  Intermittent;worst at night and getting OOB. Nutritional Status BMI of > 30 = obese  Have you ever been in a relationship where you felt threatened, hurt or afraid?No   Does patient need assistance? Functional Status Self care Ambulation Normal   Primary Care Provider:  [name] MD  CC:  Bilateral knee pain also sole of feet. Also vaginal discharge.Marland Kitchen  History of Present Illness: 30yo with pmh outlined in this chart here to discuss  1) vaginal discharge,  just went off her cycle.  has hx of recurrent bv and wants to be sure it is not coming back.   2) bilateral knee pain: pain is worst at night, very stiff in the morning takes about 30 minutes to loosen up feels like a slow crank up, sometimes has tearing sensation when she goes from sitting to standing, sometimes hears a noise coming from the knees, they have gotten hot a few times, can not tell if they get swollen (as she says because they are so big).  grandmother has rheumatoid arthritis.  also with pain in the shoulders, everyday for the past two weeks has "biofreeze" which has been working.  shoulders also feel stiff in the mornings.  has not been doing any vigorous activity.  3) pain in the soles of her feet more towards the heels, pain is worst when she gets out of bed.  Depression History:      The patient denies a depressed mood most of the day and a diminished interest in her  usual daily activities.         Preventive Screening-Counseling & Management  Alcohol-Tobacco     Alcohol drinks/day: <1     Alcohol type: beer, liquor     Smoking Status: never     Passive Smoke Exposure: yes  Caffeine-Diet-Exercise     Does Patient Exercise: no  Current Medications (verified): 1)  Methocarbamol 500 Mg Tabs (Methocarbamol) .... Take 1 Tablet By Mouth Three Times A Day As Needed 2)  Acid Controller Max St 20 Mg Tabs (Famotidine) .... Take 1 Pill Once Daily At Bed Time 3)  Tramadol Hcl 50 Mg Tabs (Tramadol Hcl) .... Take 1 -2 Tablets By Mouth Three Times A Day As Needed For Back Pain  Allergies (verified): No Known Drug Allergies  Review of Systems       per hpi  Physical Exam  General:  alert and overweight-appearing.   Head:  normocephalic and atraumatic.   Lungs:  normal respiratory effort and normal breath sounds.   Heart:  normal rate, regular rhythm, and no murmur.   Genitalia:  normal introitus, no external lesions, mucosa pink and moist, no vaginal or cervical lesions, and vaginal discharge.   Msk:  strenth 5/5 throughout pain at the anterior calcaneous on the plantar surface of  both feet, painful to extend the toes no edema/effusion, warmth, tenderness to palpation of either knee.  both knees are in normal alignment with no instability or crepitus. Pulses:  2+ Extremities:  no edema Neurologic:  alert & oriented X3, cranial nerves II-XII intact, strength normal in all extremities, sensation intact to light touch, and gait normal.   Skin:  no suspicious lesions.   Psych:  Oriented X3, memory intact for recent and remote, and flat affect.     Impression & Recommendations:  Problem # 1:  PLANTAR FASCIITIS, BILATERAL (ICD-728.71) given hand out on this subject from up to date rec NSAIDS and exercises  Orders: T- Sed rate non-auto (57846)  Problem # 2:  VAGINITIS (ICD-616.10) wet prep today to be sure that recurrent bv has not recurred looks  more like yeast faruncle has healed well with no recurrence  The following medications were removed from the medication list:    Bactrim Ds 800-160 Mg Tabs (Sulfamethoxazole-trimethoprim) .Marland Kitchen... Take one tablet two times a day for 7 days.  Orders: T-Wet Prep by [facility] [phone])  Problem # 3:  KNEE PAIN, BILATERAL (ICD-719.46) description of am stiffness, shoulder pain and stiffnes and hx of family member with RA is worrysome for inflamatory condition. exam and description of pain are more suggestive of OA. will check esr for inflamatory condition will rec nsaids will rec weight loss, strengthening exercises, and joining a pool.  she has been putting on weight and losing muscle since her back injury several years ago.  Her updated medication list for this problem includes:    Methocarbamol 500 Mg Tabs (Methocarbamol) .Marland Kitchen... Take 1 tablet by mouth three times a day as needed    Tramadol Hcl 50 Mg Tabs (Tramadol hcl) .Marland Kitchen... Take 1 -2 tablets by mouth three times a day as needed for back pain  Orders: T- Sed rate non-auto (24401)  Complete Medication List: 1)  Methocarbamol 500 Mg Tabs (Methocarbamol) .... Take 1 tablet by mouth three times a day as needed 2)  Acid Controller Max St 20 Mg Tabs (Famotidine) .... Take 1 pill once daily at bed time 3)  Tramadol Hcl 50 Mg Tabs (Tramadol hcl) .... Take 1 -2 tablets by mouth three times a day as needed for back pain  Patient Instructions: 1)  You had lab work done today.  We will call you regarding results. 2)  You have a hand out on plantar fascitis, you should do the exercises as instructed. 3)  You should call the [employer] about sliding scale rate and scholarships so that you can exercise in the pool. 4)  You should take naproxen (aleve) or ibuprofen for your foot and knee pain.  These will reduce inflamation. Process Orders Check Orders Results:     [facility]: ABN not required for this insurance Tests Sent for  requisitioning (May 24, 2009 10:49 AM):     05/24/2009: [facility] -- T-Wet Prep by [facility] [phone] (signed)     05/24/2009: [facility] -- T- Sed rate non-auto [[id number]] (signed)    Prevention & Chronic Care Immunizations   Influenza vaccine: Not documented    Tetanus booster: Not documented    Pneumococcal vaccine: Not documented  Other Screening   Pap smear: NEGATIVE FOR INTRAEPITHELIAL LESIONS OR MALIGNANCY.  (08/30/2008)   Pap smear due: 08/2007   Smoking status: never  (05/24/2009)      Resource handout printed.

## 2010-03-23 NOTE — Progress Notes (Signed)
Summary: [facility]  [facility]   Imported By: Florinda Marker 11/05/2006 15:46:03  _____________________________________________________________________  External Attachment:    Type:   Image     Comment:   External Document

## 2010-03-23 NOTE — Assessment & Plan Note (Signed)
Summary: FU VISIT/BOWERS/DS PER DR. VEGA   Vital Signs:  Patient profile:   30 year old female Height:      69 inches (175.26 cm) Weight:      276.5 pounds (125.68 kg) BMI:     40.98 Temp:     97.2 degrees F (36.22 degrees C) oral Pulse rate:   67 / minute BP sitting:   105 / 67  (left arm) Cuff size:   large  Vitals Entered By: Theotis Barrio NT II (December 28, 2009 8:46 AM) CC: PATIENT REFUSE THE FLU SHOT / PAIN MED REFILL / PAIN IN BACK- LEFT LEG  #3 Is Patient Diabetic? No Pain Assessment Patient in pain? yes     Location: BACK/LEFT LEG Intensity:       3 Type: ACE/SHARP Onset of pain  ON GOING FOR ABOUT 3 WEEKS Nutritional Status BMI of > 30 = obese  Have you ever been in a relationship where you felt threatened, hurt or afraid?No   Does patient need assistance? Functional Status Self care Ambulation Normal   Primary Care Provider:  [name] MD  CC:  PATIENT REFUSE THE FLU SHOT / PAIN MED REFILL / PAIN IN BACK- LEFT LEG  #3.  History of Present Illness: This is a 30 year old female with a past medical hx of L5-S1 disk heriation,  ?hx of ovarian Ca  vs. ovarian cyst at age 82, and GERD who presented on 10/26 for an acute visit complaining of back pain associated with left lower extremity weakness.  Pt was previously followed by Dr. Noel Gerold spine specalist but was released back in 2010. At previous visit, pt was unable to walk and was given an injection of toradol and sent home with ultram and naproxin.    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  MRI at the time demonstrated a herniated disc at L5-S1. Pt received steroid injections and had physical therapy which helped.  Ms. Eshleman symptoms gradually resolved until two weeks ago when she was making her bed and developed acute sudden lumbar back pain.  The pain was sharp, 10/10 and radiated into the left leg.  Since her last visit, the patient has been  improving but continues to have episodes in which a sharp pain shoots down her left leg and she develops worsing weakness on that side.  This typically occurs after walking for about 5 mins.  Of note, the patient denies any saddle anesthesia, urinary retention, or bowl/bladder incontinence.  The patient does however complain of night sweats that have been occuring over the last three weeks.  Pt has never experienced similar symptoms but states that she wakes up every other night completely soaked and must change her pajamas.  The patient has not noted any fever, fatigue or weight loss however.   The patient is also very concerned about her weight and is seeking information on dieting as she has been unable to excersize recently because of her back pian.   Ms. Bolinger also complains of tenia peds for which she was started on clotrimazole 1% cream at her last visit, however, she states that she never received a prescription.     Preventive Screening-Counseling & Management  Alcohol-Tobacco     Alcohol drinks/day: <1     Alcohol type: beer, liquor     Smoking Status: never     Passive Smoke Exposure: yes  Caffeine-Diet-Exercise     Does  Patient Exercise: no  Problems Prior to Update: 1)  Tinea Pedis  (ICD-110.4) 2)  Sore Throat  (ICD-462) 3)  Toe Pain  (ICD-729.5) 4)  Sinusitis, Acute  (ICD-461.9) 5)  Knee Pain, Bilateral  (ICD-719.46) 6)  Plantar Fasciitis, Bilateral  (ICD-728.71) 7)  Furuncle  (ICD-680.9) 8)  Health Maintenance Exam  (ICD-V70.0) 9)  Carpal Tunnel Syndrome  (ICD-354.0) 10)  Leg Edema, Bilateral  (ICD-782.3) 11)  Mastalgia  (ICD-611.71) 12)  Knee Pain, Left  (ICD-719.46) 13)  Back Pain, Chronic  (ICD-724.5) 14)  Postherpetic Neuralgia  (ICD-053.19) 15)  Vaginitis  (ICD-616.10) 16)  Gynecological Examination, Routine  (ICD-V72.31) 17)  Lesion, Cervix  (ICD-236.3) 18)  Allergic Rhinitis  (ICD-477.9) 19)  Cholecystectomy, Hx of  (ICD-V45.79) 20)  Gerd   (ICD-530.81) 21)  Ovarian Cystectomy, Hx of  (ICD-V45.89) 22)  Carcinoma, Basal Cell  (ICD-173.9)  Allergies: No Known Drug Allergies  Family History: Reviewed history and no changes required. Uncle stomach cancer aunts x 2 breast ca grandfather unknown CA DM in grandmother, aunt and two uncles.   Social History: Reviewed history from 06/09/2008 and no changes required. 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 Single Never Smoked  Review of Systems       Negative as per HPI.   Physical Exam  General:  alert and well-developed.   Obese Head:  normocephalic and atraumatic.   Eyes:  vision grossly intact, pupils equal, pupils round, and pupils reactive to light.   Nose:  no external deformity and no nasal discharge.   Mouth:  pharynx pink and moist.   Neck:  supple.   Lungs:  normal respiratory effort, normal breath sounds, no crackles, and no wheezes.   Heart:  normal rate, regular rhythm, no murmur, no gallop, and no rub.   Abdomen:  soft, non-tender, normal bowel sounds, and no distention.   Msk:  There is significant tenderness over the L5 spinous process.  The patient is capable of at least 60 degress of rotation of the spine in both directions.    There is not joint swelling, redeness or warmth of the joints.   Pulses:  2+ dp/pt pulses bilaterally.  Neurologic:  Reflexes are 1+ and equal bilaterally.  Sensation is intact to light touch through out.  There is 4/5 strength in the flexors and extensors or the left hip and lower leg, and the pt is unable to stand on one foot using the left side.   Cranial nerves 2-12 were intact.  Skin:  There is hyperpigmentatoin between the toes bilaterally consistant with tenia pedis.  Cervical Nodes:  There is no cervical lymphadanopathy.    Impression & Recommendations:  Problem # 1:  BACK PAIN, CHRONIC  (ICD-724.5) Assessment Improved The patients pain is improved, however, I am concerned about the etiology given that the event occured without significant trauma or effort and was acute in onset.   I am also concerned given the recent onset of night sweats, ?history of ovarian cancer (pt stated that she had it removed for CA and required medicine for some period after that), and significant tenderness over the spinous process of L5.  Although a simple lumbosacral strain is most likely, slipped spondylolisthesis and metastasis could also explain the pts symptoms. At this point we will perform an abdominal/pelvic CT scan with contrast to allow visualization of the spine and viscera.  For her pain, I will continue the patient on tramadol and naproxen as they are helping.  I will discontinue the methocarbamol at this point.  The patient as been asked to fax [place] the records from her ovarian surgery.    The following medications were removed from the medication list:    Methocarbamol 500 Mg Tabs (Methocarbamol) .Marland Kitchen... Take 1 tablet by mouth three times a day as needed Her updated medication list for this problem includes:    Ultram 50 Mg Tabs (Tramadol hcl) .Marland Kitchen... Take one tab by mouth ever 6 hours as needed for your back pain.    Naproxen 500 Mg Tabs (Naproxen) .Marland Kitchen... Take 1 tablet by mouth two times a day  Orders: CT with Contrast (CT w/ contrast)  Problem # 2:  TINEA PEDIS (ICD-110.4) I refilled the pts. clotrimazole today.   Her updated medication list for this problem includes:    Clotrimazole 1 % Crea (Clotrimazole) .Marland Kitchen... Apply two times a day for 4 weeks  Problem # 3:  Preventive Health Care (ICD-V70.0) Pt refused the flu shot today but was very interested in a diet plan.  I gave her a plan and recommended the she come to [facility], extreme lifestyle make over.  Complete Medication List: 1)  Acid Controller Max St 20 Mg Tabs (Famotidine) .... Take 1 pill once daily at bed time 2)  Ultram 50 Mg  Tabs (Tramadol hcl) .... Take one tab by mouth ever 6 hours as needed for your back pain. 3)  Naproxen 500 Mg Tabs (Naproxen) .... Take 1 tablet by mouth two times a day 4)  Clotrimazole 1 % Crea (Clotrimazole) .... Apply two times a day for 4 weeks  Patient Instructions: 1)  For your chronic back pain I would recommend that you have a medium firm matres.  You should stay active and exercise 5 x weekly doing core strengthening exercises as well as flexion/extension exercises as tolerated.  Use heat/ice on your back as tolerated. Consider weight watchers for your weight loss. Please get a [employer]. You can come to our extreeme make over Dec 13th. 11am-12pm here at the clinic. It may help you with your weight.    Prescriptions: NAPROXEN 500 MG TABS (NAPROXEN) Take 1 tablet by mouth two times a day  #30 x 1   Entered and Authorized by:   Sinda Du MD   Signed by:   Sinda Du MD on 12/28/2009   Method used:   Print then Give to Patient   RxID:   [id number] CLOTRIMAZOLE 1 % CREA (CLOTRIMAZOLE) Apply two times a day for 4 weeks  #1 tube x 2   Entered and Authorized by:   Sinda Du MD   Signed by:   Sinda Du MD on 12/28/2009   Method used:   Print then Give to Patient   RxID:   [id number] ULTRAM 50 MG TABS (TRAMADOL HCL) Take one tab by mouth ever 6 hours as needed for your back pain.  #56 x 0   Entered and Authorized by:   Sinda Du MD   Signed by:   Sinda Du MD on 12/28/2009   Method used:   Print then Give to Patient   RxID:   [id number]    Orders Added: 1)  Est. Patient Level III [53664] 2)  CT with Contrast [CT w/ contrast]     Prevention & Chronic Care Immunizations   Influenza vaccine: Not documented   Influenza vaccine deferral: Deferred  (06/08/2009)  Tetanus booster: Not documented   Td booster deferral: Deferred  (06/08/2009)    Pneumococcal vaccine: Not documented  Other Screening   Pap smear: NEGATIVE FOR  INTRAEPITHELIAL LESIONS OR MALIGNANCY.  (09/21/2009)   Pap smear due: 08/2007   Smoking status: never  (12/28/2009)

## 2010-03-23 NOTE — Progress Notes (Signed)
Summary: phone/gg  Phone Note Call from Patient   Caller: Patient Summary of Call: Pt called for xray results, they have not been signed by Dr. so I sent message to Dr Clent Ridges to inform pt of results. Initial call taken by: Merrie Roof RN,  Jul 06, 2009 2:59 PM  Follow-up for Phone Call        Thank you. Follow-up by: Zoila Shutter MD,  July 22, 2009 11:00 AM

## 2010-03-23 NOTE — Letter (Signed)
Summary: [facility]   Imported By: Louretta Parma 01/19/2010 11:44:05  _____________________________________________________________________  External Attachment:    Type:   Image     Comment:   External Document

## 2010-03-23 NOTE — Assessment & Plan Note (Signed)
Summary: ACUTE-TEST RESULTS FOR BIG TOE/LUMP ON LEG HOT TO TOUCH/CFB   Vital Signs:  Patient profile:   30 year old female Height:      69 inches (175.26 cm) Weight:      266.6 pounds (121.18 kg) BMI:     39.51 Temp:     98.2 degrees F (36.78 degrees C) oral Pulse rate:   67 / minute BP sitting:   94 / 65  (left arm) Cuff size:   large  Vitals Entered By: Cynda Familia Duncan Dull) (September 01, 2009 9:10 AM) CC: pt here to f/u left great toe pain and obtain xray results, left knee pain an "popping" constant knee pain past 2 days pain 10/10 Is Patient Diabetic? No Pain Assessment Patient in pain? yes     Location: left great toe Intensity: 4 Type: aching Onset of pain  Intermittent x Nutritional Status BMI of > 30 = obese  Have you ever been in a relationship where you felt threatened, hurt or afraid?No   Does patient need assistance? Functional Status Self care Ambulation Normal   Primary Care Provider:  [name] MD  CC:  pt here to f/u left great toe pain and obtain xray results and left knee pain an "popping" constant knee pain past 2 days pain 10/10.  History of Present Illness: Patient is a 30 year old female who presents today because of left great toe pain as well as left knee pain.  The knee pain has been present on and off for the last 2 weeks but the past two days have been the worst.  She noticed the pain first when she was walking around her bed and planted and turned the corner when she felt and heard a pop.  The pain is a sharp pain that is worse with walking and is mostly in the medial part of the knee just below the kneecap.    The left great toe pain has been present for several months.  She was seen in May of 2011 and had X-rays of the foot taken which showed a possible avulsion fracture at the distal end of the proximal phalanx in the left great toe. She was advised to buddy tape it and wear hard soled shoes.  She denies any swelling, redness, or  warmth to the toe but has noticed that she can not bend it.   Preventive Screening-Counseling & Management  Alcohol-Tobacco     Alcohol drinks/day: <1     Alcohol type: beer, liquor     Smoking Status: never     Passive Smoke Exposure: yes  Current Medications (verified): 1)  Methocarbamol 500 Mg Tabs (Methocarbamol) .... Take 1 Tablet By Mouth Three Times A Day As Needed 2)  Acid Controller Max St 20 Mg Tabs (Famotidine) .... Take 1 Pill Once Daily At Bed Time  Allergies (verified): No Known Drug Allergies  Past History:  Past medical, surgical, family and social histories (including risk factors) reviewed for relevance to current acute and chronic problems.  Past Medical History: Reviewed history from 07/30/2007 and no changes required. Basal cell carcinoma, recurrent in left maxillary sinus (dx'ed with basal cell nouveau -> hereditary syndrome) has had 5 surgeries, last one in 2003. Left ovarian fibroma Sinus cyst Recurrent vaginal candidiasis Gardnerella + in 08/08 Hx of herpetic whitlow requiring I+D (was bitten by the autistic child that cares for).  Past Surgical History: Reviewed history from 07/24/2006 and no changes required. Oophorectomy, left Cholecystectomy Sinus surgery (x2 at  age 81 and 69)  Family History: Reviewed history and no changes required.  Social History: Reviewed history from 06/09/2008 and no changes required. 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 Single Never Smoked  Review of Systems      See HPI  Physical Exam  Additional Exam:  General: Well developed, female in no acute distress Head: Atraumatic, normocephalic with no signs of trauma  Eyes: PERRLA, EOM intact  Ears: TM intact  Nose: Nares patent, mucosa is pink and moist, no polyps noted  Mouth: Mucosa is pink and moist, dention,   Neck: Supple, full ROM,  no thyromegaly or masses noted  Resp: Clear to ascultation bilaterally, no wheezes, rales, or rhonchi noted  CV: Regular rate and rhythm with no murmurs, rubs, or gallops noted  Abdomen: Soft, non-tender, non-distended with normal bowel sounds  Knee: effusion present above the patella and to the medial side distal to the patella.  There is pain to palpation over the MCL and in the medial joint line.  Anterior drawer, Lachman's, posterior drawer, and McMurray's tests are normal. Left foot: There is no swelling in the joints of the foot.  There is pain to palpation over the left great toe in the MTP and the DIP.  ROM of the great toe is limited by pain more significantly in the DIP joint.   Neurologic: Alert and oriented x3, Cranial nerves II-XII grossly intact, DTR normal and symmetric  Pulses: Radial, brachial, carotid, femoral, dorsal pedis, and posterior tibial pulses equal and symmetric     Impression & Recommendations:  Problem # 1:  TOE PAIN (ICD-729.5) She continues to have the Left great toe pain.  She has tried buddy taping that helped decrease the pain.  She has not tried Ibuprofen or tylenol or ice.  We will have her start Ibuprofen OTC 1-2 tablets every 6 hours as needed for the pain.  She can alternate Tylenol OTC 1-2 tablets every 4 hours for the pain as well if the ibuprofen is not covering the pain.  Ice for 20 minutes 2-3 times per day would also help.  She is encouraged to continue buddy taping the toe as well as wearing shoes instead of flip flops.  Problem # 2:  KNEE PAIN, LEFT (ICD-719.46) This is likely due to compensating for the left toe injury.  There does not appear to be any laxity to the tendons on exam.  The pain control that we are using for her toe will help with this pain as well.  Ice for 20 minutes 2-3 times per day would also help the swelling.  Problem # 3:  HEALTH MAINTENANCE EXAM (ICD-V70.0) She is due for her health maintenance exam and has her appointment  with Dr. Odis Luster in late August of this year. She will be due for her Pap as well as a Tetenus booster at that time.   Complete Medication List: 1)  Methocarbamol 500 Mg Tabs (Methocarbamol) .... Take 1 tablet by mouth three times a day as needed 2)  Acid Controller Max St 20 Mg Tabs (Famotidine) .... Take 1 pill once daily at bed time  Patient Instructions: 1)  Start Ibuprofen 1-2 tablets every 6 hours as needed for the pain. 2)  Tylenol is okay as well 1-2 tablets every 4 hours as needed for the pain.   3)  Best to alternate the tylenol  and ibuprofen. 4)  Ice 2-3 times per day for 20 minutes at a time to the knee and toe. 5)  Buddy taping the Toe to the next one will help 6)  Good shoes will help the pain as well.   Prevention & Chronic Care Immunizations   Influenza vaccine: Not documented   Influenza vaccine deferral: Deferred  (06/08/2009)    Tetanus booster: Not documented   Td booster deferral: Deferred  (06/08/2009)    Pneumococcal vaccine: Not documented  Other Screening   Pap smear: NEGATIVE FOR INTRAEPITHELIAL LESIONS OR MALIGNANCY.  (08/30/2008)   Pap smear due: 08/2007   Smoking status: never  (09/01/2009)

## 2010-03-23 NOTE — Progress Notes (Signed)
Summary: med refill/gp  Phone Note Refill Request Message from:  Fax from Pharmacy on January 18, 2010 2:30 PM  Refills Requested: Medication #1:  ULTRAM 50 MG TABS Take one tab by mouth ever 6 hours as needed for your back pain.   Last Refilled: 12/29/2009 Last appt. 01/17/10. Pt. received qty 56.   Method Requested: Electronic Initial call taken by: Chinita Pester RN,  January 18, 2010 2:31 PM    Prescriptions: ULTRAM 50 MG TABS (TRAMADOL HCL) Take one tab by mouth ever 6 hours as needed for your back pain.  #56 x 0   Entered and Authorized by:   Zoila Shutter MD   Signed by:   Zoila Shutter MD on 01/18/2010   Method used:   Electronically to        [facility] (retail)       [address] [facility] [address]       [place], [place]  [address]       Ph: [phone]       Fax: [phone]   RxID:   [id number]

## 2010-03-27 ENCOUNTER — Ambulatory Visit (INDEPENDENT_AMBULATORY_CARE_PROVIDER_SITE_OTHER): Payer: Medicaid Other | Admitting: Dietician

## 2010-03-27 DIAGNOSIS — E119 Type 2 diabetes mellitus without complications: Secondary | ICD-10-CM

## 2010-03-27 NOTE — Progress Notes (Signed)
Medical Nutrition Therapy:  Appt start time: 0900 end time:  0930.  Assessment:  Primary concerns today: Weight management and cholesterol results.  Usual eating pattern includes Meal 3 and 2+ snacks per day.      Avoided foods include: none except tried to avoid foods high in saturated fats.     Usual physical activity includes walking 15 minutes a day 5 days per week  Diagnosis and Intervention:  Progress Towards Goal(s):  In progress   Nutritional Diagnosis:  NI-1.5 Excessive energy intake As related to intake of higher calorie foods.  As evidenced by patient report and BMI of 38.  Interventions: 1- E.2.1: Education and counseling about patient lipid panel values and how to interpret values 2- E-1.5 Education and counseling on how to improve values through physical activity and food choices 3- C-2.5 and C 2.7 Social support and stimulus control -encouragement to continue efforts of weight loss despite setbacks. Order pedometer and encouraged peer support- friend present today and supportive.     Monitoring/Evaluation:  Dietary intake prn  Follow up prn

## 2010-03-27 NOTE — Patient Instructions (Signed)
Try to walk at least 15 minutes twice daily at least 6 days a week  Try to reduce your saturated fat grams to < 15 grams a day and your trans fat intake ot < 2 grams per day.    It'll work if you keep working it- which you are doing a great job! Keep at it no matter what!

## 2010-04-11 ENCOUNTER — Ambulatory Visit: Payer: Medicaid Other | Attending: Orthopedic Surgery | Admitting: Occupational Therapy

## 2010-04-11 DIAGNOSIS — M25549 Pain in joints of unspecified hand: Secondary | ICD-10-CM | POA: Insufficient documentation

## 2010-04-11 DIAGNOSIS — M6281 Muscle weakness (generalized): Secondary | ICD-10-CM | POA: Insufficient documentation

## 2010-04-11 DIAGNOSIS — IMO0001 Reserved for inherently not codable concepts without codable children: Secondary | ICD-10-CM | POA: Insufficient documentation

## 2010-04-13 ENCOUNTER — Ambulatory Visit: Payer: Medicaid Other | Admitting: Physical Therapy

## 2010-04-19 ENCOUNTER — Ambulatory Visit: Payer: Medicaid Other | Admitting: Occupational Therapy

## 2010-04-25 ENCOUNTER — Ambulatory Visit: Payer: Medicaid Other | Attending: Orthopedic Surgery | Admitting: Occupational Therapy

## 2010-04-25 DIAGNOSIS — M6281 Muscle weakness (generalized): Secondary | ICD-10-CM | POA: Insufficient documentation

## 2010-04-25 DIAGNOSIS — IMO0001 Reserved for inherently not codable concepts without codable children: Secondary | ICD-10-CM | POA: Insufficient documentation

## 2010-04-25 DIAGNOSIS — M25549 Pain in joints of unspecified hand: Secondary | ICD-10-CM | POA: Insufficient documentation

## 2010-05-01 LAB — HEMOCCULT GUIAC POC 1CARD (OFFICE): Fecal Occult Bld: NEGATIVE

## 2010-05-03 ENCOUNTER — Ambulatory Visit: Payer: Medicaid Other | Admitting: Occupational Therapy

## 2010-05-08 ENCOUNTER — Ambulatory Visit: Payer: Medicaid Other | Admitting: Occupational Therapy

## 2010-05-09 ENCOUNTER — Telehealth: Payer: Self-pay | Admitting: *Deleted

## 2010-05-09 ENCOUNTER — Ambulatory Visit (INDEPENDENT_AMBULATORY_CARE_PROVIDER_SITE_OTHER): Payer: Medicaid Other | Admitting: Internal Medicine

## 2010-05-09 ENCOUNTER — Encounter: Payer: Self-pay | Admitting: Internal Medicine

## 2010-05-09 VITALS — BP 113/69 | HR 71 | Temp 99.0°F | Ht 69.0 in | Wt 261.7 lb

## 2010-05-09 DIAGNOSIS — J069 Acute upper respiratory infection, unspecified: Secondary | ICD-10-CM | POA: Insufficient documentation

## 2010-05-09 MED ORDER — BENZONATATE 100 MG PO CAPS: 100.0000 mg | ORAL_CAPSULE | Freq: Four times a day (QID) | ORAL | Status: DC | PRN

## 2010-05-09 NOTE — Telephone Encounter (Signed)
Pt calls stating she has congestion x 1 wk, gradually becoming worse, chest tight, speaking in a whisper, cough tight, painful, productive w/ thick yellow/ green mucous, denies fever but has not checked. Tired. Short of breath and it is noted as she speaks. appt given for 1515.

## 2010-05-09 NOTE — Patient Instructions (Addendum)
   Please follow-up at the clinic in 3-4 weeks, at which time we will reevaluate your cough.  Please drink plenty of water.  You have been started on new medication(s), if you develop throat closing, tongue swelling, rash, please stop the medication and call the clinic at [phone] and go to the ER.  Please bring all of your medications in a bag to your next visit.  If you are diabetic, please bring your meter to your next visit.  If symptoms worsen, or new symptoms arise, please call the clinic or go to the ER. If you develop high fevers, are unable to keep down fluids/ foods for several days, or feel difficulty breathing, return to clinic or go to ER.

## 2010-05-09 NOTE — Progress Notes (Signed)
  Subjective:    Patient ID: Jordan [name], female    DOB: 11-Apr-1980, 30 y.o.   MRN: [id number]  HPI Pt is a 30 yo female with a pmhx of gerd, obesity, who presents to the clinic acutely today for the following:  1) Cold symptoms - patient indicates a 1.5 week history of sore throat, post nasal drip, nasal congestion, BL ear pain, headaches (frontal), generalized myalgias. Patient also has developed dry cough x 3 days. Also confirms 2 day history of watery, yellow stools that are foul smelling. Denies fevers, chills, ear discharge, sinus pain, or sinus pressure. Patient denies recent sick contacts with similar symptoms. Does not smoke. Does not carry a diagnosis of asthma. Patient taking Nyquil and Theraflu with mild improvement.   Review of Systems Per HPI.  Current Outpatient Medications Medication Sig  . clotrimazole (LOTRIMIN) 1 % cream Apply topically 2 (two) times daily. Apply 2 times a day for 4 weeks   . etodolac (LODINE) 400 MG tablet Take 400 mg by mouth 2 (two) times daily.    . famotidine (PEPCID) 20 MG tablet Take 20 mg by mouth 2 (two) times daily.    . methocarbamol (ROBAXIN) 500 MG tablet Take 500 mg by mouth 3 (three) times daily as needed.    . traMADol (ULTRAM) 50 MG tablet Take 50 mg by mouth every 6 (six) hours as needed. For back pain    . benzonatate (TESSALON PERLES) 100 MG capsule Take 1 capsule (100 mg total) by mouth every 6 (six) hours as needed for cough.  . naproxen (NAPROSYN) 500 MG tablet Take 500 mg by mouth 2 (two) times daily.      Allergies Review of patient's allergies indicates no known allergies.  Past Medical History  Diagnosis Date  . Basal cell carcinoma     recurrent in left maxillary, has had 5 surguries, last one 2003  . Fibroma     left ovarian  . Cyst of nasal sinus   . Candidiasis, vagina     recurrent  . Gardnerella infection     + in 08/08  . Whitlow     hx of herpetic requiring I+D,( was bitten by autistic child that cares fr     Past Surgical History  Procedure Date  . Left oophorectomy   . Cholecystectomy   . Nasal sinus surgery     X 2 at age 80 & 19       Objective:   Physical Exam General: Vital signs reviewed and noted. Well-developed,well-nourished,in no acute distress; alert,appropriate and cooperative throughout examination. Head: normocephalic, atraumatic. No tenderness to palpation over frontal or maxillary sinuses. Ears: TM nonerythematous, not bulging, good light reflex bilaterally. Nose: Mucous membranes mildly inflamed, nonerythematous. Throat: Oropharynx mildly erythematous, no exudate appreciated. Mucous membranes moist. Neck: No deformities, masses, or tenderness noted. No lymphadenopathy appreciated. Lungs: Normal respiratory effort. Clear to auscultation BL without crackles or wheezes.  Heart: RRR. S1 and S2 normal without gallop, murmur, or rubs.  Abdomen: BS normoactive. Soft, Nondistended, non-tender.  No masses or organomegaly. Extremities: No pretibial edema.     Assessment & Plan:  Case and plan of care discussed with Dr. Ulyess Mort.

## 2010-05-10 ENCOUNTER — Ambulatory Visit: Payer: Medicaid Other | Admitting: Rehabilitation

## 2010-05-10 NOTE — Assessment & Plan Note (Signed)
No evidence of pneumonia and patient remains afebrile, not tachycardic, still able to intake fluids and food. No lymphadenopathy appreciated. Likely viral upper respiratory infection versus influenza-like infection.  PLAN: - Symptomatic therapy suggested: push fluids, rest and return office visit prn if symptoms persist or worsen. - Lack of antibiotic effectiveness discussed with her.  - Call or return to clinic prn if these symptoms worsen or fail to improve as anticipated. - Rx for tessalon perles for supportive care - Benadryl at night PRN if having itchy/ watery eyes, allergic symptoms.

## 2010-05-11 ENCOUNTER — Ambulatory Visit: Payer: Medicaid Other | Admitting: Rehabilitation

## 2010-05-15 ENCOUNTER — Encounter: Payer: Medicaid Other | Admitting: Occupational Therapy

## 2010-05-16 ENCOUNTER — Ambulatory Visit: Payer: Medicaid Other | Admitting: Rehabilitation

## 2010-05-18 ENCOUNTER — Ambulatory Visit: Payer: Medicaid Other | Admitting: Rehabilitation

## 2010-05-22 ENCOUNTER — Encounter: Payer: Medicaid Other | Admitting: Occupational Therapy

## 2010-05-23 ENCOUNTER — Ambulatory Visit: Payer: Medicare Other | Attending: Rehabilitation | Admitting: Rehabilitation

## 2010-05-23 DIAGNOSIS — M25549 Pain in joints of unspecified hand: Secondary | ICD-10-CM | POA: Insufficient documentation

## 2010-05-23 DIAGNOSIS — M6281 Muscle weakness (generalized): Secondary | ICD-10-CM | POA: Insufficient documentation

## 2010-05-23 DIAGNOSIS — IMO0001 Reserved for inherently not codable concepts without codable children: Secondary | ICD-10-CM | POA: Insufficient documentation

## 2010-05-25 ENCOUNTER — Ambulatory Visit: Payer: Medicare Other | Admitting: Rehabilitation

## 2010-05-30 ENCOUNTER — Ambulatory Visit: Payer: Medicare Other | Admitting: Rehabilitation

## 2010-05-30 ENCOUNTER — Ambulatory Visit: Payer: Medicaid Other | Admitting: Dietician

## 2010-05-31 ENCOUNTER — Ambulatory Visit: Payer: Medicare Other | Admitting: Rehabilitation

## 2010-06-06 ENCOUNTER — Ambulatory Visit: Payer: Medicare Other | Admitting: Rehabilitation

## 2010-06-08 ENCOUNTER — Ambulatory Visit: Payer: Medicare Other | Admitting: Rehabilitation

## 2010-06-12 ENCOUNTER — Ambulatory Visit: Payer: Medicare Other

## 2010-06-15 ENCOUNTER — Ambulatory Visit: Payer: Medicare Other | Admitting: Rehabilitative and Restorative Service Providers"

## 2010-06-19 ENCOUNTER — Ambulatory Visit: Payer: Medicare Other

## 2010-06-22 ENCOUNTER — Ambulatory Visit: Payer: Medicare Other | Admitting: Rehabilitation

## 2010-06-23 ENCOUNTER — Encounter: Payer: Medicaid Other | Admitting: Internal Medicine

## 2010-06-23 ENCOUNTER — Ambulatory Visit (INDEPENDENT_AMBULATORY_CARE_PROVIDER_SITE_OTHER): Payer: Medicare Other | Admitting: Internal Medicine

## 2010-06-23 DIAGNOSIS — F329 Major depressive disorder, single episode, unspecified: Secondary | ICD-10-CM

## 2010-06-23 DIAGNOSIS — G4700 Insomnia, unspecified: Secondary | ICD-10-CM

## 2010-06-23 DIAGNOSIS — M797 Fibromyalgia: Secondary | ICD-10-CM

## 2010-06-23 DIAGNOSIS — F32A Depression, unspecified: Secondary | ICD-10-CM

## 2010-06-23 DIAGNOSIS — IMO0001 Reserved for inherently not codable concepts without codable children: Secondary | ICD-10-CM

## 2010-06-23 DIAGNOSIS — E669 Obesity, unspecified: Secondary | ICD-10-CM

## 2010-06-23 MED ORDER — CITALOPRAM HYDROBROMIDE 20 MG PO TABS: 20.0000 mg | ORAL_TABLET | Freq: Every day | ORAL | Status: DC

## 2010-06-23 MED ORDER — ZOLPIDEM TARTRATE 5 MG PO TABS: 5.0000 mg | ORAL_TABLET | Freq: Every evening | ORAL | Status: DC | PRN

## 2010-06-23 NOTE — Patient Instructions (Signed)
I have prescribed 2 new medications for you today. Celexa should be taken every day. After a few week, you will hopefully notice improvement in your fibromyalgia, depression, and insomnia. Ambien should be taken only as needed at night for sleep on a short term basis.   Continue with physical therapy and eat a healthy diet.

## 2010-06-26 ENCOUNTER — Encounter: Payer: Self-pay | Admitting: Internal Medicine

## 2010-06-26 DIAGNOSIS — G4700 Insomnia, unspecified: Secondary | ICD-10-CM | POA: Insufficient documentation

## 2010-06-26 DIAGNOSIS — M797 Fibromyalgia: Secondary | ICD-10-CM | POA: Insufficient documentation

## 2010-06-26 DIAGNOSIS — R5382 Chronic fatigue, unspecified: Secondary | ICD-10-CM | POA: Insufficient documentation

## 2010-06-26 DIAGNOSIS — G4701 Insomnia due to medical condition: Secondary | ICD-10-CM | POA: Insufficient documentation

## 2010-06-26 NOTE — Assessment & Plan Note (Signed)
Has been working on diet and exercising regularly. Is followed by Jamison Neighbor for nutrition counseling.

## 2010-06-26 NOTE — Progress Notes (Signed)
Subjective:    Patient ID: Jordan [name], female    DOB: 1980-06-04, 30 y.o.   MRN: [id number]  HPI 30yo W presents for follow-up/evaluation of: 1. Body aches. Was referred a few months ago to an orthopedist for suspected carpal tunnel. Was told that testing indicated that she did not have carpal tunnel. Was referred to rheumatologist who told her she has fibromyalgia. She has been going to physical therapy, which has helped, and is doing regular stretches/exercises. Is interested if there is any medication that can help.  2. Insomnia. States that she previously never had any trouble falling asleep or staying asleep. However, recently she has been laying in bed for hours before falling asleep and then frequently wakes up after only 2-3 hours of sleep. She says that she goes to bed at about the same time each night (approximately 10pm) and turns off lights/tv to make a dark, quiet environment. She typically exercises early in the day but recently tried exercising at night in the hope that would make her more tired; however, this has not helped and her difficulty sleeping is unchanged.  3. Depression. She states that she has recently started seeing a therapist who considered starting her on an antidepressant but deferred until she is seen by use for treatment of fibromyalgia. In addition to sleep disturbance, she also endorses some depressed mood. However, she denies any suicidal thoughts.   Current Outpatient Prescriptions on File Prior to Visit  Medication Sig Dispense Refill  . etodolac (LODINE) 400 MG tablet Take 400 mg by mouth 2 (two) times daily.        . famotidine (PEPCID) 20 MG tablet Take 20 mg by mouth 2 (two) times daily.        . methocarbamol (ROBAXIN) 500 MG tablet Take 500 mg by mouth 3 (three) times daily as needed.        . traMADol (ULTRAM) 50 MG tablet Take 50 mg by mouth every 6 (six) hours as needed. For back pain        . clotrimazole (LOTRIMIN) 1 % cream Apply topically 2  (two) times daily. Apply 2 times a day for 4 weeks         Past Medical History  Diagnosis Date  . Basal cell carcinoma     recurrent in left maxillary, has had 5 surguries, last one 2003  . Fibroma     left ovarian  . Cyst of nasal sinus   . Candidiasis, vagina     recurrent  . Gardnerella infection     + in 08/08  . Whitlow     hx of herpetic requiring I+D,( was bitten by autistic child that cares fr     Review of Systems  Constitutional: Negative for fever, chills and fatigue.  HENT: Negative for congestion, sore throat and postnasal drip.   Eyes: Negative for visual disturbance.  Respiratory: Negative for cough, chest tightness, shortness of breath and wheezing.   Cardiovascular: Negative for chest pain and palpitations.  Gastrointestinal: Negative for nausea, vomiting, abdominal pain, diarrhea, constipation and blood in stool.  Genitourinary: Negative for dysuria.  Musculoskeletal: Positive for myalgias.  Skin: Negative for rash.  Neurological: Negative for dizziness, numbness and headaches.  Psychiatric/Behavioral: Positive for sleep disturbance and dysphoric mood. Negative for suicidal ideas, behavioral problems and self-injury. The patient is not nervous/anxious.        Objective:   Physical Exam  Nursing note and vitals reviewed. Constitutional: She is oriented to person, place, and  time. She appears well-developed and well-nourished. No distress.  HENT:  Head: Normocephalic and atraumatic.  Mouth/Throat: Oropharynx is clear and moist. No oropharyngeal exudate.  Eyes: Conjunctivae are normal. Pupils are equal, round, and reactive to light.  Neck: Normal range of motion. Neck supple. No tracheal deviation present. No thyromegaly present.  Cardiovascular: Normal rate, regular rhythm, normal heart sounds and intact distal pulses.  Exam reveals no gallop and no friction rub.   No murmur heard. Pulmonary/Chest: Effort normal and breath sounds normal. No respiratory  distress. She has no wheezes. She exhibits no tenderness.  Abdominal: Soft. Bowel sounds are normal. She exhibits no distension. There is no tenderness. There is no rebound and no guarding.  Musculoskeletal: Normal range of motion. She exhibits no edema and no tenderness.  Lymphadenopathy:    She has no cervical adenopathy.  Neurological: She is alert and oriented to person, place, and time. No cranial nerve deficit.  Skin: Skin is warm and dry. No rash noted. She is not diaphoretic.  Psychiatric: She has a normal mood and affect. Her behavior is normal. Judgment and thought content normal.          Assessment & Plan:

## 2010-06-26 NOTE — Assessment & Plan Note (Signed)
Hopefully, insomnia will improve with treatment of fibromyalgia and depression. Since Celexa will take several weeks to take effect, will prescribe Ambien for short term treatment of insomnia. Patient encouraged to continue good sleep hygiene and to avoid exercise in the 2-3 hours before bed.

## 2010-06-26 NOTE — Assessment & Plan Note (Signed)
Will start Celexa 20mg  daily in the hopes that this will help with fibromyalgia, depression, and insomnia. When she presents for follow-up, if symptoms are unchanged or slightly better, will consider increasing dose. Patient encouraged to continue with physical therapy and routine exercise.

## 2010-06-26 NOTE — Assessment & Plan Note (Signed)
Will start Celexa as discussed. Patient encouraged to continue with therapy. She was also advised to seek immediate medical attention if she develops suicidal thoughts.

## 2010-06-27 ENCOUNTER — Encounter: Payer: Medicaid Other | Admitting: Rehabilitation

## 2010-06-29 ENCOUNTER — Ambulatory Visit: Payer: Medicare Other | Attending: Rehabilitation | Admitting: Rehabilitation

## 2010-06-29 DIAGNOSIS — M25549 Pain in joints of unspecified hand: Secondary | ICD-10-CM | POA: Insufficient documentation

## 2010-06-29 DIAGNOSIS — M6281 Muscle weakness (generalized): Secondary | ICD-10-CM | POA: Insufficient documentation

## 2010-06-29 DIAGNOSIS — IMO0001 Reserved for inherently not codable concepts without codable children: Secondary | ICD-10-CM | POA: Insufficient documentation

## 2010-07-04 NOTE — Group Therapy Note (Signed)
NAME:  DEL, WISEMAN               ACCOUNT NO.:  [id number]   MEDICAL RECORD NO.:  [id number]          PATIENT TYPE:  WOC   LOCATION:  [facility]                   FACILITY:  [facility]   PHYSICIAN:  Allie Bossier, MD        DATE OF BIRTH:  [birth year]   DATE OF SERVICE:                                  CLINIC NOTE   Ms. Herberger is a 30 year old single black gravida 0 who was referred here  by the [facility] after she has complained of 42-month  history of stabbing for that pelvic pain midline, this occurs  periodically and lasts for a maximum of 0.5 minutes and has been going  on over the last month.  An ultrasound was ordered and done on April 06, 2008, and it showed a 4.7-cm hemorrhagic cyst in her right ovary.  Her left ovary was removed in the past because of ovarian cyst.  The  patient tells me that she was also told that she has a cyst on her  cervix that they would like evaluate it, and she has had recurrent yeast  infections as her third complaint.  Happily, the yeast infections have  resolved at present after lifestyle changes (different underwear, eating  yogurt daily, etc).   On exam,  1. Her cervix is visualized and small Nabothian cysts are seen.  I      have counseled her that these are entirely normal and are not      consequence.  2. With regard to her ovarian cyst, I will schedule a followup      ultrasound in approximately 6 weeks.  In the meantime, I have      advised her to continue Tylenol and Advil as necessary.  With      regard to her yeast infections, I have counseled her that should      they return she can use plain yogurt with active cultures of      Lactobacillus.  This can be placed on a tampon and placed in for      vaginally periodically should these infections return.      Allie Bossier, MD     MCD/MEDQ  D:  04/08/2008  T:  04/09/2008  Job:  [id number]   cc:   [facility]

## 2010-07-04 NOTE — Discharge Summary (Signed)
Jordan [name], Jordan [name]               ACCOUNT NO.:  [id number]   MEDICAL RECORD NO.:  [id number]          PATIENT TYPE:  OBV   LOCATION:  6704                         FACILITY:  [facility]   PHYSICIAN:  Fransisco Hertz, M.D.  DATE OF BIRTH:  [birth year]   DATE OF ADMISSION:  06/05/2007  DATE OF DISCHARGE:  06/06/2007                               DISCHARGE SUMMARY   DISCHARGE DIAGNOSES:  1. Swelling of right third fingertip, probably herpetic whitlow.  2. Urinary tract infection.  3. Vaginal discharge.   OTHER INACTIVE DIAGNOSES:  1. Hereditary basal cell carcinoma, recurrent in nature and followed      by Dr. Vertell Limber in [place], [place].  2. Allergic rhinitis.  3. Gastroesophageal reflux disease.  4. Status post cholecystectomy.  5. Status post ovarian cystectomy on the left in 1996.  6. Shoulder tendinitis.  7. Cervical lesion in the past, inactive right now.   DISCHARGE MEDICATIONS:  1. Bactrim DS 1 tablet p.o. t.i.d. for 10 days.  2. Acyclovir 400 mg p.o. t.i.d. for 10 days.  3. Ibuprofen 200 mg p.o. p.r.n. for pain up to every 4 hourly.  4. Prilosec 20 mg p.o. daily p.r.n.   DISPOSITION AND FOLLOWUP:  Ms. Roache will follow up with Dr. Dominica Severin in his office in [place] on June 09, 2007, on coming  Monday at 10:45 a.m.  Followup issues include a look at the wound and  further care.  She also needs to follow up on pending labs, basically  the culture of the abscess as well as urine culture.   CONSULTATION:  Dionne Ano. Amanda Pea, M.D. from [facility].   PROCEDURES DONE DURING THIS HOSPITALIZATION:  1. An I&D done on June 05, 2007, by Dr. Amanda Pea.  2. Two-view x-ray of the wrist and middle finger was done, which was      positive for a small total soft tissue swelling over the right long      finger terminal  phalanx, question paronychia.   BRIEF HISTORY OF PRESENT ILLNESS:  Jordan [name] is a 30 year old lady with  a past medical history of familial  basal cell carcinoma, came with a  complaint of swelling in the right third fingertip for about 1 week.  She was seen on June 02, 2007, in the internal medicine clinic with her  primary care doctor and was noted to have sudden onset of swelling in  the medial aspect of the terminal phalanx of her right third finger,  which she noticed while she woke from sleep in the morning.  No history  of trauma.  No history of cat bite.  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  No  fever, chills, or similar swelling and pain anywhere else except some  minor swelling and pain on the wrist on the dorsal aspect.  For this  reason, she was seen again in the clinic on June 05, 2007, and was  therefore admitted just to rule out any spreading infection and Dr.  Amanda Pea was called for  further care.   PHYSICAL EXAMINATION:  VITAL SIGNS:  Temp 98.6, blood pressure 199/62,  pulse 73.  GENERAL:  In no acute distress.  CHEST:  Clear bilaterally.  CARDIOVASCULAR:  First and second heart sounds normal, regular rhythm.  No rubs, murmurs, or gallops.  ABDOMEN/PELVIS:  Bowel sounds are normal.  Soft and nontender.  EXTREMITY:  On right upper extremity exam, swelling and a vesicle-like  lesion in the medial aspect of the terminal part of third finger.  It  does not involve the nailbed.  There is minimum swelling of the wrist.  There is tenderness on the finger in the dorsal aspect, but there is no  pain whilst she moves her finger.  NEURO EXAMINATION:  Alert and oriented x4.   LABS AT ADMISSION:  WBC 5.4, hemoglobin 13.8, platelets 175, MCV  86.5,  RDW 14.1.  Sodium 130, potassium 3.8, chloride 104, bicarb 24, BUN 6,  creatinine 0.69, and glucose 81.  UA with cloudy, large leukocytes, many  squamous epithelial, 2150 wbc's, many bacteria, few urinary yeasts.   HOSPITAL COURSE:  1. Right third finger swelling and pain.  Dr. Amanda Pea from [facility] was consulted and he basically bedside I&D'ed and this      fluid was sent for many microbiological studies including aerobic      and anaerobic culture.  The gram stain of which is negative and the      final report is pending.  It was also sent for viral culture, which      is pending.  Blood cultures were also sent x2, which is pending.      Urine culture is also pending x2.  She also had a genital      examination done, which was negative for any cervical tenderness,      but had some whitish discharge and a gonococcal and Chlamydia study      was done, which was negative.  She was treated with Bactrim and      Keflex for 1 day and she was discharged home with Bactrim and      Acyclovir and will follow up with Dr. Amanda Pea on coming Monday,      which is on 20th of this month.  2. UTI.  She has some urinary frequency and UA was consistent with      UTI. We will send her home with Bactrim, which will hopefully      cover her for UTI as well.  Her urine culture is pending.  I will      follow with the urine culture and if her infection is not      susceptible to Bactrim, I will follow and take care of it.  3. Vaginal discharge.  It is getting better, gonococcal and Chlamydia      study was negative.   Her vitals on the day of discharge include temp of 98.3, pulse 54,  respirations 18, blood pressure 84/48, and sating 96% on room air.  Sodium 138, potassium 4.9, chloride 108,  bicarb 22, glucose 118, BUN  10, creatinine 0.77, calcium 8.9.  WBC 5.3, hemoglobin 12.9, MCV 86.7,  RDW 14, and platelets 161.   CONDITION ON DISCHARGE:  Minimal pain.  The patient is moving her arm  and fingers, feeling good.      Jason Coop, MD  Electronically Signed      Fransisco Hertz, M.D.  Electronically Signed    YP/MEDQ  D:  06/06/2007  T:  06/07/2007  Job:  [id number]

## 2010-07-04 NOTE — Op Note (Signed)
NAME:  CHEEKSCynthia, Cogle               ACCOUNT NO.:  [id number]   MEDICAL RECORD NO.:  [id number]          PATIENT TYPE:  OBV   LOCATION:  6704                         FACILITY:  [facility]   PHYSICIAN:  Dionne Ano. Gramig III, M.D.DATE OF BIRTH:  [birth year]   DATE OF PROCEDURE:  06/05/2007  DATE OF DISCHARGE:                               OPERATIVE REPORT   I was asked to see Jordan [name] today in the [facility]  system. This patient is a 30 year old female who complains of pain in  her right middle finger with abscess formation.  She has a small  blistered region which has not responded to antibiotics over the last 5-  7 days. I was asked to see her by internal medicine in regards to the  upper extremity predicament.  She also complained of wrist pain.  She  denies unusual exposure or puncture to the areas.  She has had a distant  history of pain and problems in the finger, but nothing recent or  nothing that she obviously notes that insited this issue.  I have  reviewed this at length and her findings.  I should note she has been  seen and evaluated by myself at length.  She denies history of viral  illness or other problems in the past.   ALLERGIES:  None.   PAST MEDICAL HISTORY:  Basal cell cancer, recurrent in nature, secondary  to a hereditary syndrome.   ALLERGIES:  Are noted.   She has a history of cholecystectomy, gastroesophageal reflux disease,  multiple basal cell tumors removed. Her last Pap smear was stable she  states.   Family history and social history are reviewed.   PHYSICAL EXAMINATION:  On exam, she is a pleasant female alert and  oriented in no acute distress.  Vital signs are stable.  The patient's  exam is notable for pain of the wrist.  However, she had no synovitis,  redness, erythema, or signs of dystrophic reaction or ascending  cellulitic infection. Elbow and shoulder stable about the right upper  extremity.  She has normal pulses.  Normal  refill. FDP and FDS and  extensor function is intact. Middle finger radially has a small area of  fluctuance consistent with abscess versus vesicular eruption, etc.  I  have reviewed this with her at length.  There is no Kanavel's signs or  signs of frank cellulitis or extensor flexor sheath infection.   I should note that she has noted a vaginal discharge but denies any  unusual sexual contacts, etc.  The teaching service noted that the  patient had what appeared to be cloudy UA with large leukocytes and 21-  50 WBCs.  I have reviewed this at length.   X-rays are negative for fracture, dislocation or space occupying lesion.   IMPRESSION:  Abscess right hand.   PLAN:  I have verbally consented her for I&D.  She was a given a  thorough discussion in regards to risks and benefits.   DESCRIPTION OF PROCEDURE:  She was taken to procedure suite and  underwent intermetacarpal block about the flexor sheath  with lidocaine  without epinephrine.  Following this, prepped and draped in the usual  sterile fashion with Betadine scrub and paint.  I then performed an  incision about the abscess. Dissection was carried down and culture  including viral culture, aerobic and anaerobic culture for gram stain,  C&S, was also accomplished.  She tolerated this well, there were no  complicating features.  I irrigated copiously and covered the wound with  Adaptic followed by a sterile dressing.   We will await cultures.  I would place her on Bactrim and Keflex and  also place her on Indocin 75 mg slow release.  I do feel this could  likely be followed as an outpatient as soon as she is stable and ready.  I have reviewed this with her at length and the do's and dont's, etc.  If she has any worsening or problems, we will be following her closely,  of course, and perform any necessary endeavors.   It was a pleasure to see her today, her I&D went without difficulty, we  will await her cultures and plan for  wound care.   I specifically should note her wrist is sore, but she had no tenderness  on passive or active range of motion.  She has no evidence of  cellulitis, synovitis, or obvious deep space infection or sepsis in the  wrist at this juncture.  We will keep a close eye on this.           ______________________________  Dionne Ano. Everlene Other, M.D.     Nash Mantis  D:  06/05/2007  T:  06/05/2007  Job:  [id number]

## 2010-07-04 NOTE — Group Therapy Note (Signed)
NAME:  Jordan [name], Jordan [name]               ACCOUNT NO.:  [id number]   MEDICAL RECORD NO.:  [id number]          PATIENT TYPE:  WOC   LOCATION:  [facility]                   FACILITY:  [facility]   PHYSICIAN:  Caren Griffins, CNM       DATE OF BIRTH:  1980-08-01   DATE OF SERVICE:  06/02/2008                                  CLINIC NOTE   REASON FOR VISIT:  Results of her ultrasound.   HISTORY:  This is a 30 year old nulliparous female who was last seen  here 2 months ago with pelvic pain.  Her ultrasound showed right  hemorrhagic cyst and recommendation was for followup.  She did have her  followup on May 18, 2008.  She stated that she is pain free.  Her  followup ultrasound showed complete resolution of the right hemorrhagic  cyst and surgically absent left ovary and small 2 x 2 focal fibroid,  also a nabothian cyst.  She states she is not sexually active and not  using birth control.  Her menses are regular with 26-day interval.  She  has no symptoms or problems today.  Her last Pap was 2 months ago at  [facility].   PHYSICAL EXAMINATION:  Deferred today.   ASSESSMENT:  Resolution of right hemorrhagic ovarian cyst, herniated  disk stable on medical therapy, small fibroids, and nabothian cyst.   PLAN:  The patient is reassured.  Discussed healthcare maintenance  issues for her including weight reduction and restriction of  anticalories particularly.  She is also advised to exercise regularly.  She elects to follow up her Pap smears with her primary MD.           ______________________________  Caren Griffins, CNM     DP/MEDQ  D:  06/02/2008  T:  06/03/2008  Job:  [id number]

## 2010-07-12 ENCOUNTER — Encounter: Payer: Medicare Other | Admitting: Rehabilitation

## 2010-07-12 ENCOUNTER — Encounter: Payer: Medicare Other | Admitting: Rehabilitative and Restorative Service Providers"

## 2010-07-16 ENCOUNTER — Emergency Department (HOSPITAL_COMMUNITY)
Admission: EM | Admit: 2010-07-16 | Discharge: 2010-07-16 | Disposition: A | Discharge status: Home / Self Care | Payer: Medicare Other | Attending: Emergency Medicine | Admitting: Emergency Medicine

## 2010-07-16 DIAGNOSIS — L539 Erythematous condition, unspecified: Secondary | ICD-10-CM | POA: Insufficient documentation

## 2010-07-16 DIAGNOSIS — M7989 Other specified soft tissue disorders: Secondary | ICD-10-CM | POA: Insufficient documentation

## 2010-07-16 DIAGNOSIS — IMO0001 Reserved for inherently not codable concepts without codable children: Secondary | ICD-10-CM | POA: Insufficient documentation

## 2010-07-16 DIAGNOSIS — M79609 Pain in unspecified limb: Secondary | ICD-10-CM | POA: Insufficient documentation

## 2010-07-18 ENCOUNTER — Encounter: Payer: Medicare Other | Admitting: Rehabilitation

## 2010-07-18 ENCOUNTER — Emergency Department (HOSPITAL_COMMUNITY): Payer: Medicare Other

## 2010-07-18 ENCOUNTER — Emergency Department (HOSPITAL_COMMUNITY)
Admission: EM | Admit: 2010-07-18 | Discharge: 2010-07-18 | Disposition: A | Discharge status: Home / Self Care | Payer: Medicare Other | Attending: Emergency Medicine | Admitting: Emergency Medicine

## 2010-07-18 DIAGNOSIS — M549 Dorsalgia, unspecified: Secondary | ICD-10-CM | POA: Insufficient documentation

## 2010-07-18 DIAGNOSIS — M7989 Other specified soft tissue disorders: Secondary | ICD-10-CM | POA: Insufficient documentation

## 2010-07-18 DIAGNOSIS — Z79899 Other long term (current) drug therapy: Secondary | ICD-10-CM | POA: Insufficient documentation

## 2010-07-18 DIAGNOSIS — IMO0002 Reserved for concepts with insufficient information to code with codable children: Secondary | ICD-10-CM | POA: Insufficient documentation

## 2010-07-18 DIAGNOSIS — G8929 Other chronic pain: Secondary | ICD-10-CM | POA: Insufficient documentation

## 2010-07-18 DIAGNOSIS — M79609 Pain in unspecified limb: Secondary | ICD-10-CM | POA: Insufficient documentation

## 2010-07-19 ENCOUNTER — Telehealth: Payer: Self-pay | Admitting: *Deleted

## 2010-07-19 NOTE — Telephone Encounter (Signed)
Pt seen in ED yesterday for infection in left middle finger.  She had I & D done and was given vicodin 5/500 mg. She is on antibiotics also. She is to f/u with [place] and is to see a hand specialist. She is asking for something stronger for pain.  I advised her to call the hand specialist NOW and get a f/u appointment.  He would be the best to change meds and treat. I told her if she has any problem getting in with him to call us back for appointment.

## 2010-07-20 ENCOUNTER — Encounter: Payer: Medicare Other | Admitting: Rehabilitation

## 2010-07-20 NOTE — Telephone Encounter (Signed)
I agree. Thank you.

## 2010-07-28 ENCOUNTER — Ambulatory Visit (INDEPENDENT_AMBULATORY_CARE_PROVIDER_SITE_OTHER): Payer: Medicare Other | Admitting: Internal Medicine

## 2010-07-28 ENCOUNTER — Encounter: Payer: Self-pay | Admitting: Internal Medicine

## 2010-07-28 DIAGNOSIS — IMO0001 Reserved for inherently not codable concepts without codable children: Secondary | ICD-10-CM

## 2010-07-28 DIAGNOSIS — F32A Depression, unspecified: Secondary | ICD-10-CM

## 2010-07-28 DIAGNOSIS — L03019 Cellulitis of unspecified finger: Secondary | ICD-10-CM

## 2010-07-28 DIAGNOSIS — E669 Obesity, unspecified: Secondary | ICD-10-CM

## 2010-07-28 DIAGNOSIS — M797 Fibromyalgia: Secondary | ICD-10-CM

## 2010-07-28 DIAGNOSIS — G4700 Insomnia, unspecified: Secondary | ICD-10-CM

## 2010-07-28 DIAGNOSIS — F329 Major depressive disorder, single episode, unspecified: Secondary | ICD-10-CM

## 2010-07-28 HISTORY — DX: Reserved for inherently not codable concepts without codable children: IMO0001

## 2010-07-28 MED ORDER — CITALOPRAM HYDROBROMIDE 40 MG PO TABS: 40.0000 mg | ORAL_TABLET | Freq: Every day | ORAL | Status: DC

## 2010-07-28 MED ORDER — TEMAZEPAM 7.5 MG PO CAPS: 7.5000 mg | ORAL_CAPSULE | Freq: Every evening | ORAL | Status: DC | PRN

## 2010-07-28 NOTE — Patient Instructions (Signed)
We have prescribed a new medication to help you sleep (Restoril). Try taking one 7.5mg  tablet at bedtime. If that doesn't help, try taking 2 tablets at bedtime.  You're doing a great job of losing weight!! Keep watching your diet and exercise!  Follow up with your hand surgeon on June 13th as scheduled.

## 2010-07-28 NOTE — Assessment & Plan Note (Signed)
Body aches improved, but not resolved, since starting Celexa one month ago. Will increase Celexa to 40mg  daily.

## 2010-07-28 NOTE — Assessment & Plan Note (Signed)
Patient is doing well with exercise regimen and making good diet choices. Has lost 9 pounds in the past month! Reinforced good behaviors and congratulated her on the great progress.

## 2010-07-28 NOTE — Assessment & Plan Note (Signed)
Reports less irritable mood since starting Celexa, which she has tolerated well. Will increase Celexa to 40mg  daily as discussed above.

## 2010-07-28 NOTE — Progress Notes (Signed)
Subjective:    Patient ID: Jordan [name], female    DOB: [birth year], 30 y.o.   MRN: [id number]  HPI 30 year old woman presents for followup of: 1. Fibromyalgia. Was seen one month ago having been recently diagnosed with fibromyalgia was complaining at that time of diffuse bodyaches and irritable mood.  She was started on 20 mg of Celexa which she has tolerated well.  Her bodyaches and mood are improved since starting this medication.  She is interested in increasing the dose of medication to see if she will get more benefit. 2. Insomnia. She continues to have trouble sleeping.  She is dissatisfied with the Ambien that was prescribed at last visit.  She states that taking 5 or 10 mg of Ambien will typically help her fall asleep but that she will still wake up one to 2 hours later.  She estimates that she is still getting only 2-3 hours of sleep per night and would like to try a different sleep aid.  She continues to report good sleep hygiene.  3. Obesity.  Continues to exercise and watch her diet.  Has lost 9 pounds since last visit and Is very pleased with her progress. 4. Paronychia. Was seen in the ED end of May with infection in L 3rd finger, started on doxycycline and referred to hand specialist. Hand specialist said it was an infection of her nail bed, drained it. Finger is now healing well. She will see hand specialist again for follow-up June 13th.   Current Outpatient Prescriptions on File Prior to Visit  Medication Sig Dispense Refill  . clotrimazole (LOTRIMIN) 1 % cream Apply topically 2 (two) times daily. Apply 2 times a day for 4 weeks       . etodolac (LODINE) 400 MG tablet Take 400 mg by mouth 2 (two) times daily.        . famotidine (PEPCID) 20 MG tablet Take 20 mg by mouth 2 (two) times daily.        Marland Kitchen guaiFENesin (MUCINEX) 600 MG 12 hr tablet Take 600 mg by mouth 2 (two) times daily.        . methocarbamol (ROBAXIN) 500 MG tablet Take 500 mg by mouth 3 (three) times daily as  needed.        . traMADol (ULTRAM) 50 MG tablet Take 50 mg by mouth every 6 (six) hours as needed. For back pain        . DISCONTD: citalopram (CELEXA) 20 MG tablet Take 1 tablet (20 mg total) by mouth daily.  30 tablet  2  . DISCONTD: zolpidem (AMBIEN) 5 MG tablet Take 1 tablet (5 mg total) by mouth at bedtime as needed for sleep.  30 tablet  0    Past Medical History  Diagnosis Date  . Basal cell carcinoma     recurrent in left maxillary, has had 5 surguries, last one 2003  . Fibroma     left ovarian  . Cyst of nasal sinus   . Candidiasis, vagina     recurrent  . Gardnerella infection     + in 08/08  . Whitlow     hx of herpetic requiring I+D,( was bitten by autistic child that cares fr     Review of Systems  Constitutional: Negative for fever, chills and fatigue.  HENT: Negative for sore throat, neck pain and sinus pressure.   Eyes: Negative for visual disturbance.  Respiratory: Negative for cough and shortness of breath.   Cardiovascular: Negative for chest pain  and palpitations.  Gastrointestinal: Negative for nausea, vomiting, abdominal pain, diarrhea and constipation.  Genitourinary: Negative for difficulty urinating.  Musculoskeletal: Negative for myalgias, back pain and joint swelling.  Skin: Negative for rash.  Neurological: Negative for dizziness, weakness, light-headedness, numbness and headaches.  Psychiatric/Behavioral: Positive for sleep disturbance. Negative for suicidal ideas, self-injury and dysphoric mood.  All other systems reviewed and are negative.       Objective:   Physical Exam  Vitals reviewed. Constitutional: She is oriented to person, place, and time. She appears well-developed and well-nourished. No distress.  HENT:  Head: Normocephalic and atraumatic.  Mouth/Throat: Oropharynx is clear and moist. No oropharyngeal exudate.  Eyes: Conjunctivae and EOM are normal. Pupils are equal, round, and reactive to light. No scleral icterus.  Neck:  Normal range of motion. Neck supple.  Cardiovascular: Normal rate, regular rhythm and normal heart sounds.  Exam reveals no gallop and no friction rub.   No murmur heard. Pulmonary/Chest: Effort normal and breath sounds normal. No respiratory distress. She has no wheezes. She has no rales.  Abdominal: Soft. Bowel sounds are normal. There is no tenderness. There is no rebound and no guarding.  Musculoskeletal: Normal range of motion. She exhibits no edema and no tenderness.       L middle finger bandaged  Neurological: She is alert and oriented to person, place, and time. No cranial nerve deficit.  Skin: Skin is warm and dry. No rash noted. She is not diaphoretic.  Psychiatric: She has a normal mood and affect. Her behavior is normal. Judgment and thought content normal.          Assessment & Plan:

## 2010-07-28 NOTE — Assessment & Plan Note (Signed)
Continues to have trouble falling and staying asleep. Ambien helped her fall asleep but she stopped taking it because she would still wake up after an hour or 2. Will try Restoril 7.5mg  (she may increase to 15mg  taken at bedtime if 7.5 does not work). Reinforced good sleep hygiene. If Restoril does not help, may consider extended release Ambien.

## 2010-07-28 NOTE — Assessment & Plan Note (Signed)
S/p drainage. Completed antibiotics. Will follow-up with hand specialist on June 13th as previously scheduled.

## 2010-08-22 ENCOUNTER — Ambulatory Visit (INDEPENDENT_AMBULATORY_CARE_PROVIDER_SITE_OTHER): Payer: Medicare Other | Admitting: Internal Medicine

## 2010-08-22 ENCOUNTER — Encounter: Payer: Self-pay | Admitting: Internal Medicine

## 2010-08-22 DIAGNOSIS — F3289 Other specified depressive episodes: Secondary | ICD-10-CM

## 2010-08-22 DIAGNOSIS — F329 Major depressive disorder, single episode, unspecified: Secondary | ICD-10-CM

## 2010-08-22 DIAGNOSIS — G4700 Insomnia, unspecified: Secondary | ICD-10-CM

## 2010-08-22 DIAGNOSIS — F32A Depression, unspecified: Secondary | ICD-10-CM

## 2010-08-22 MED ORDER — TRAZODONE HCL 100 MG PO TABS: 100.0000 mg | ORAL_TABLET | Freq: Every day | ORAL | Status: DC

## 2010-08-22 MED ORDER — TRAZODONE HCL 50 MG PO TABS: 50.0000 mg | ORAL_TABLET | Freq: Every day | ORAL | Status: DC

## 2010-08-22 NOTE — Assessment & Plan Note (Signed)
Well-controlled with new increased dosage of Celexa, reports better mood.  Denies any SI/HI/hallucinations. -Will continue Celexa 40mg 

## 2010-08-22 NOTE — Progress Notes (Signed)
30 yo woman presents for new onset of insomnia after diagnosed with fibromyalgia that is not resolved with Ambien or Restoril.  She has been sleeping 2-3 hours per night. She states that she can only sleep a few hours with Ambien and was prescribed Restoril last office visit 07/28/10.   She is not tolerating Restoril well because it gives her headache.   She is practicing good sleep hygiene including no TV in bed, caffeine late at night, etc.  As for her depression, Celexa is helping with her mood greatly and reduce her pain, denies any SI/HI/hallucinations.  No other complaints today.  ROS:  Review of Systems  Constitutional: Negative for fever, chills. + fatigue.  HENT: Negative for sore throat, neck pain and sinus pressure.  Eyes: Negative for visual disturbance.  Respiratory: Negative for cough and shortness of breath.  Cardiovascular: Negative for chest pain and palpitations.  Gastrointestinal: Negative for nausea, vomiting, abdominal pain, diarrhea and constipation.  Genitourinary: Negative for difficulty urinating.  Musculoskeletal: Negative for myalgias, back pain and joint swelling.  Skin: Negative for rash.  Neurological: Negative for dizziness, weakness, light-headedness, numbness and headaches.  Psychiatric/Behavioral: Positive for sleep disturbance. Negative for suicidal ideas, self-injury and dysphoric mood.  All other systems reviewed and are negative.  PE: General: alert, well-developed, and cooperative to examination.  Lungs: normal respiratory effort, no accessory muscle use, normal breath sounds, no crackles, and no wheezes. Heart: normal rate, regular rhythm, no murmur, no gallop, and no rub.  Abdomen: soft, non-tender, normal bowel sounds, no distention, no guarding, no rebound tenderness Msk: no joint swelling, no joint warmth, and no redness over joints. Extremities: No cyanosis, clubbing, edema Neurologic: alert & oriented X3, cranial nerves II-XII intact, strength  normal in all extremities, sensation intact to light touch, and gait normal.  Skin: turgor normal and no rashes.  Psych: Oriented X3, memory intact for recent and remote, normally interactive, good eye contact, not anxious appearing, and not depressed appearing.

## 2010-08-22 NOTE — Patient Instructions (Signed)
Please take Trazodone 100mg  1 tablet 30 minutes before bedtime. Follow up in 2 weeks with Dr. Anselm Jungling

## 2010-08-22 NOTE — Assessment & Plan Note (Signed)
Patient has never had insomnia problem in the past, she states that it started with her fibromyalgia.  Patient reports that low dose Ambien only make her sleep a few hours and Restoril which was prescribed during last office visit gives her headache.   -Will try short course of Trazodone 100mg  po qhs -Continue counseled on good sleep hygiene -Follow up in 2 weeks

## 2010-09-05 ENCOUNTER — Encounter: Payer: Self-pay | Admitting: Internal Medicine

## 2010-09-05 ENCOUNTER — Ambulatory Visit (INDEPENDENT_AMBULATORY_CARE_PROVIDER_SITE_OTHER): Payer: Medicare Other | Admitting: Internal Medicine

## 2010-09-05 DIAGNOSIS — G4700 Insomnia, unspecified: Secondary | ICD-10-CM

## 2010-09-05 DIAGNOSIS — M797 Fibromyalgia: Secondary | ICD-10-CM

## 2010-09-05 DIAGNOSIS — IMO0001 Reserved for inherently not codable concepts without codable children: Secondary | ICD-10-CM

## 2010-09-05 MED ORDER — TRAMADOL HCL 50 MG PO TABS: 50.0000 mg | ORAL_TABLET | Freq: Four times a day (QID) | ORAL | Status: DC | PRN

## 2010-09-05 NOTE — Assessment & Plan Note (Signed)
Stable.  She states that sometimes she takes 1-2 tablets of Tramadol for her pain and it works well.   -prescription of Tramadol was given today.

## 2010-09-05 NOTE — Progress Notes (Signed)
HPI: 29 yo woman with fibromyalgia and insomnia presents today for follow on her insomnia.  Patient reports that Trazodone is working very well for her and that she has been sleep from 9pm-6am daily.  She is feeling much refreshed in the morning.  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  She has no other complaints today.  ROS: as per HPI  PE: General: alert, well-developed, and cooperative to examination. .  Lungs: normal respiratory effort, no accessory muscle use, normal breath sounds, no crackles, and no wheezes. Heart: normal rate, regular rhythm, no murmur, no gallop, and no rub.  Abdomen: soft, non-tender, normal bowel sounds, no distention, no guarding, no rebound tenderness. Msk: no joint swelling, no joint warmth, and no redness over joints.  Pulses: 2+ DP/PT pulses bilaterally Extremities: No cyanosis, clubbing, edema Neurologic: alert & oriented X3, cranial nerves II-XII intact, strength normal in all extremities, sensation intact to light touch, and gait normal.  Psych: Oriented X3, memory intact for recent and remote, normally interactive, good eye contact, not anxious appearing, and not depressed appearing.

## 2010-09-05 NOTE — Patient Instructions (Signed)
Continue Trazodone for sleep-aid.  You can start tapering by cutting the dose in half once you can fall asleep on your own. Take Tramadol one tablet every 6-8 hours for pain as needed Follow up in 4 months with your primary care physician

## 2010-09-05 NOTE — Assessment & Plan Note (Signed)
Significant improvement.  I discussed continuing good sleep hygiene and suggested that she taper off the medication once she can fall asleep on her own. -Will continue Trazodone

## 2010-09-18 ENCOUNTER — Ambulatory Visit: Payer: Medicare Other | Attending: Rehabilitation

## 2010-09-18 DIAGNOSIS — IMO0001 Reserved for inherently not codable concepts without codable children: Secondary | ICD-10-CM | POA: Insufficient documentation

## 2010-09-18 DIAGNOSIS — M6281 Muscle weakness (generalized): Secondary | ICD-10-CM | POA: Insufficient documentation

## 2010-09-18 DIAGNOSIS — M25549 Pain in joints of unspecified hand: Secondary | ICD-10-CM | POA: Insufficient documentation

## 2010-09-27 ENCOUNTER — Encounter: Payer: Self-pay | Admitting: Internal Medicine

## 2010-09-27 ENCOUNTER — Ambulatory Visit (INDEPENDENT_AMBULATORY_CARE_PROVIDER_SITE_OTHER): Payer: Medicare Other | Admitting: Internal Medicine

## 2010-09-27 DIAGNOSIS — IMO0001 Reserved for inherently not codable concepts without codable children: Secondary | ICD-10-CM

## 2010-09-27 DIAGNOSIS — J309 Allergic rhinitis, unspecified: Secondary | ICD-10-CM

## 2010-09-27 DIAGNOSIS — G4700 Insomnia, unspecified: Secondary | ICD-10-CM

## 2010-09-27 DIAGNOSIS — Z0000 Encounter for general adult medical examination without abnormal findings: Secondary | ICD-10-CM

## 2010-09-27 DIAGNOSIS — F329 Major depressive disorder, single episode, unspecified: Secondary | ICD-10-CM

## 2010-09-27 DIAGNOSIS — E669 Obesity, unspecified: Secondary | ICD-10-CM

## 2010-09-27 DIAGNOSIS — K219 Gastro-esophageal reflux disease without esophagitis: Secondary | ICD-10-CM

## 2010-09-27 DIAGNOSIS — M797 Fibromyalgia: Secondary | ICD-10-CM

## 2010-09-27 DIAGNOSIS — F32A Depression, unspecified: Secondary | ICD-10-CM

## 2010-09-27 LAB — LIPID PANEL
Cholesterol: 260 mg/dL — ABNORMAL HIGH (ref 0–200)
HDL: 88 mg/dL (ref >39)
LDL Cholesterol: 158 mg/dL — ABNORMAL HIGH (ref 0–99)
Total CHOL/HDL Ratio: 3 Ratio
Triglycerides: 72 mg/dL (ref <150)
VLDL: 14 mg/dL (ref 0–40)

## 2010-09-27 MED ORDER — ETODOLAC 400 MG PO TABS: 400.0000 mg | ORAL_TABLET | Freq: Two times a day (BID) | ORAL | Status: DC

## 2010-09-27 NOTE — Assessment & Plan Note (Addendum)
Patient is no longer taking Pepcid. She has been having much fewer symptoms since she has been losing weight. No medications at this time.

## 2010-09-27 NOTE — Assessment & Plan Note (Signed)
The patient is having fewer pain problems with her fibromyalgia since starting Celexa. She occasionally gets pain in her knees as well and her shoulders. The pain is managed very well at this time. No changes to medications at this visit. She has been able to be more active and get more exercise now, and has improved her diet.

## 2010-09-27 NOTE — Assessment & Plan Note (Signed)
The patient has been taking Celexa 40 mg daily since June. She is having a great improvement in her mood since then. She would like to continue at this time. She is having some dry mouth as well as some sweating from this medication. I did advise that she continue on come or put a hard candy in her mouth to encourage saliva production. I did advise her that there is not much we can do about the sweating, and if it is a significant problem we may have to switch medications in the future. She is liking the results of the Celexa, and does not wish to switch medications at this time. She said that she will deal with the sweating.

## 2010-09-27 NOTE — Assessment & Plan Note (Signed)
Per the patient she is not having any symptoms of allergic rhinitis at this time. She states that when she gets an episode she does take Flonase which helps with the symptoms. She has not needed Flonase at all this year.

## 2010-09-27 NOTE — Progress Notes (Signed)
Subjective:    Patient ID: Jordan [name], female    DOB: 1980/11/01, 30 y.o.   MRN: [id number]  HPI:  The patient is a 30 year old female who comes in today for a routine checkup, and to meet her new doctor. She does need a refill on her etodolac at this time, which I will refill. She has been working on improving her diet, and exercising more frequently. I did strongly encourage her to continue this, and congratulated her on her recent weight loss. She states that since she has been changing her diet and increasing in size she has lost weight. She questioned the need to do self breast exams so I talked to her about the risks and the benefits, and she did verbalize understanding. I did mention that if she were to start doing self breast exam but better to pick one time a month and stick with that particular time of the monthly breast exams as they're her monthly changes based on hormone levels and the breast tissue. She states that her GERD symptoms have been pretty much resolved and she's been losing weight. She is not on any medication for GERD at this time. She states that she has not had any exacerbations of her allergies in the past year. When she does have exacerbation she said Flonase helps. She does state that she has a history of recurrent cysts and her maxilla that she does follow at [facility] for. She has not had any problems with this in 10 years. However she still sees [facility] regularly to follow with this issue. She does wonder what her cholesterol is today, and she is due for a fasting lipid panel so we will try that at this visit as she is fasting. Her fibromyalgia, depression, anxiety have all been much improvement since she's been taking Celexa 40 mg daily. She was started on this in June of this year. She is having a little bit of dry mouth, and some sweating associated with the Celexa change.     Review of Systems  Constitutional: Positive for activity change. Negative for fever, appetite  change, fatigue and unexpected weight change.       Pt has increased her activity level.  HENT: Negative.   Eyes: Negative.   Respiratory: Negative.   Cardiovascular: Negative.   Gastrointestinal: Negative.  Negative for nausea, vomiting, diarrhea and constipation.  Genitourinary: Negative.  Negative for dysuria, urgency, frequency, hematuria, vaginal discharge, difficulty urinating, genital sores and pelvic pain.  Musculoskeletal: Positive for myalgias, back pain and arthralgias.  Skin: Negative.   Neurological: Negative.   Hematological: Negative.   Psychiatric/Behavioral: Negative for hallucinations, dysphoric mood, decreased concentration and agitation. The patient is not hyperactive.    Vitals:  BP: 99/69 Temperature 98.3 Fahrenheit Pulse 69     Objective:   Physical Exam  Constitutional: She is oriented to person, place, and time. She appears well-developed and well-nourished.  HENT:  Head: Normocephalic and atraumatic.  Eyes: EOM are normal. Pupils are equal, round, and reactive to light.  Neck: Normal range of motion. Neck supple. No JVD present.  Cardiovascular: Normal rate, regular rhythm and normal heart sounds.   Pulmonary/Chest: Effort normal and breath sounds normal.  Abdominal: Soft. Bowel sounds are normal. She exhibits no distension. There is no tenderness. There is no rebound and no guarding.  Musculoskeletal: Normal range of motion. She exhibits tenderness. She exhibits no edema.       There is slight tenderness in the knees that is not that  severe.  Lymphadenopathy:    She has no cervical adenopathy.  Neurological: She is alert and oriented to person, place, and time. No cranial nerve deficit.  Skin: Skin is warm and dry.  Psychiatric: She has a normal mood and affect. Her behavior is normal. Judgment and thought content normal.          Assessment & Plan:  See problem oriented charting. We will see the patient back in one year, and I have advised her  that should she has any issues between now and then she is free to call our office and make an appointment sooner.

## 2010-09-27 NOTE — Progress Notes (Signed)
[name]' history and physical examination were reviewed with Dr. Dorise Hiss.  We formulated her assessment and plan together.  I agree with Dr. Lavonna Monarch documentation.

## 2010-09-27 NOTE — Assessment & Plan Note (Signed)
Patient has been working on her diet, and is doing well. She has been trying to exercise and has been successful. I did encourage her to continue working on diet and exercise modification. She would like to speak with Jamison Neighbor again, and I have referred her at this time. She has been losing weight. I did congratulate her and encourage her to continue losing weight. We did check a fasting lipid panel at this visit. I will call her with the results.

## 2010-09-27 NOTE — Patient Instructions (Signed)
You were seen today for a follow up and to meet your new doctor, Dr. Dorise Hiss. We have advised that you are not due for a PAP smear at this time. You can try using hard candy or gum to help with dry mouth. If you have continued sweating that bothers you and you want to consider switching medicines please call our office for an appointment. Our number is [phone]. If you feel you need to be seen sooner please call, otherwise we will see you back in 1 year. We have made a referral to see Jamison Neighbor to discuss your diet.

## 2010-09-27 NOTE — Assessment & Plan Note (Signed)
The patient does have trazodone to help her sleep. She was originally taking this every single day of the week when she started. She has since had improvement in her sleep quality and ability to fall asleep, and only takes it twice a week. She will continue to wean the trazodone until she doesn't need it at all to fall asleep.

## 2010-09-28 ENCOUNTER — Encounter: Payer: Self-pay | Admitting: Internal Medicine

## 2010-09-29 ENCOUNTER — Telehealth: Payer: Self-pay | Admitting: Internal Medicine

## 2010-09-29 MED ORDER — TRAMADOL HCL 50 MG PO TABS: 50.0000 mg | ORAL_TABLET | Freq: Four times a day (QID) | ORAL | Status: DC | PRN

## 2010-09-29 NOTE — Telephone Encounter (Signed)
I did call the pt with the results of her recent lab work, at this time I did encourage her to continue exercise and diet modification. I also encouraged her to continue discussing diet and exercise with Jamison Neighbor when she sees her. I also did refill her tramadol.

## 2010-10-04 ENCOUNTER — Ambulatory Visit (INDEPENDENT_AMBULATORY_CARE_PROVIDER_SITE_OTHER): Payer: Medicare Other | Admitting: Dietician

## 2010-10-04 DIAGNOSIS — E669 Obesity, unspecified: Secondary | ICD-10-CM

## 2010-10-04 DIAGNOSIS — Z6836 Body mass index (BMI) 36.0-36.9, adult: Secondary | ICD-10-CM

## 2010-10-04 NOTE — Patient Instructions (Signed)
Weight today was 246.6#.  Keep track for 1 week as discussed.  Feel free to contact me by email: [email or web address]  See you next week at 8 :30 AM

## 2010-10-04 NOTE — Progress Notes (Signed)
Intensive Behavioral Therapy for Obesity ( visit #1 ) : Stat time: 8:40 AM End time: 9:10 AM  1. Height:69"    Weight today:246.6#      BMI:36.6      IBW=145# (131#-160#)        RBW = 170# (160# -180#) 2. Assessment:  Dietary (nutritional) assessment:stopped eating and drinking sodas and many high sugar, high salt  and high fat products. Discussed recent lipid values that have not changed.   Physical activity includes:  [employer], walks with grandmother 30 minutes 2-3 times a week and  also by herself on other days totally ~ 30 -40 minutes daily  Behavioral Health risk(s): exercise needed to maintain HDL levels while working on weight loss,   fiber intake  and changing fat intake to promote lower LDL cholesterol  Factors affecting behavior change goals: family supportive and eats out regularly. Depression and  medication side affects 3- Advise:  Behavior change advise:total calories matter, counting fat grams helps to limit calories  Health harms and benefits:weight loss may help lipids, decrease risk for diabetes, heart disease,  physical activity helps with depression, mood, increase good cholesterol, weight maintenance. 4- Agreement on Goals:  For next week:keep food records x 7 days including time, amounts and food decription  For next 6 months:attend sessions   In next year:  Long term: 5- Assisted today with: label readings, record keeping and calorie counting principles   6- Arrange follow up:  weekly x4 weeks, scheduled for next Wednesday at 8:30 AM

## 2010-10-11 ENCOUNTER — Ambulatory Visit (INDEPENDENT_AMBULATORY_CARE_PROVIDER_SITE_OTHER): Payer: Medicare Other | Admitting: Dietician

## 2010-10-11 ENCOUNTER — Encounter: Payer: Self-pay | Admitting: Dietician

## 2010-10-11 DIAGNOSIS — Z6836 Body mass index (BMI) 36.0-36.9, adult: Secondary | ICD-10-CM

## 2010-10-11 DIAGNOSIS — E669 Obesity, unspecified: Secondary | ICD-10-CM

## 2010-10-11 NOTE — Progress Notes (Signed)
Intensive Behavioral Therapy for Obesity ( visit #1 ) : Stat time:   9:30 AM End time: 10:00 AM  1. Height:69"    Weight today:243.0#      BMI:36.0       IBW=145# (131#-160#)        RBW = 170# (160# -180#)             Patient Goal weight: 180#             6 month (04/06/2011) goal weight: 226.6# 2. Assessment:  Dietary (nutritional) assessment:kept 1 day complete food record and 1 day partial food record   which showed ~ 200 calories and 75 grams fat grams per day. Weight decreased 3.6# in past   week.   Physical activity includes:  Continues to walk with grandmother 30 minutes 2-3 times a week and  also by herself on other days totally ~ 30 -40 minutes daily. parking farther out to increase exercise.  3- Advise:  Behavior change advise:total calories matter, counting fat grams helps to limit calories and improve  lipids 4- Agreement on Goals:  For next week:keep fat gram food records x 4 days including time, amounts and food decription  For next 6 months:attend sessions and weight loss of 7% ( ~226#)  In next year: not set  Long term: 180# 5- Assisted today with: review of label reading, record keeping and importance of detail, self monitoring, goal  And appropriate goal setting   6- Arrange follow up:  weekly x2 weeks, then biweekly x 5 months, evaluate 6 month progress. scheduled  for next Wednesday at 9:00 AM

## 2010-10-11 NOTE — Patient Instructions (Signed)
Great work!!!  Reminder about emailing me if you'd like form phone what you want me to print out.    Next week: weigh yourself on Sunday evening and record on sheet.                    count fat grams x  4 days to get back to 55-60 gram per day.  6 month goal is to attend all sessions

## 2010-10-14 ENCOUNTER — Other Ambulatory Visit: Payer: Self-pay | Admitting: Internal Medicine

## 2010-10-16 ENCOUNTER — Other Ambulatory Visit: Payer: Self-pay | Admitting: Internal Medicine

## 2010-10-16 NOTE — Telephone Encounter (Signed)
Tramadol refilled 09/29/2010 with 1 additional refill.  Refill request too soon at this point.  If #60 per month is not enough please clarify and we can adjust the number appropriately.

## 2010-10-17 NOTE — Telephone Encounter (Signed)
Pt calls and it is explained that she will need an appt if the dosage of tramadol is not helping, she states she lost 12 tablets and she has been taking 3 daily and no it doesn't help very much, appt is given per charsettah. fri 8/31 at 1330

## 2010-10-18 ENCOUNTER — Ambulatory Visit (INDEPENDENT_AMBULATORY_CARE_PROVIDER_SITE_OTHER): Payer: Medicare Other | Admitting: Dietician

## 2010-10-18 DIAGNOSIS — Z6836 Body mass index (BMI) 36.0-36.9, adult: Secondary | ICD-10-CM

## 2010-10-18 DIAGNOSIS — E669 Obesity, unspecified: Secondary | ICD-10-CM

## 2010-10-18 NOTE — Progress Notes (Signed)
Intensive Behavioral Therapy for Obesity ( visit #3 ) : Stat time:   9:20 AM End time: 10:00 AM  1. Height:69"    Weight today:244.1#      BMI:36.1       IBW=145# (131#-160#)        RBW = 170# (160# -180#)             Patient Goal weight: 180#             6 month (04/06/2011) goal weight: 226.6# 2. Assessment:  Dietary (nutritional) assessment:kept 5 day complete food record suing fat secret mobile app   showing average of ~ 1600 calories, 60 grams fat per day. Weight stable.   Physical activity includes:  Less activity last week due to [pain from fibromyalgia, to speak with   physician this Friday about pain. 3- Advise:  Behavior change advise:total calories matter, counting fat grams helps to limit calories and improve  Lipids. Reviewed "how to be a fat detective" skills from last week and learned new skill: planning   meal choices to lowe fat this week using "Three ways to eat less fat".  4- Agreement on Goals:  For next week:plan meals to lower fat  For next 6 months:attend sessions and weight loss of 7% ( ~226#)  In next year: not set  Long term: 180# 5- Assisted today with: Measuring, weighing, estimating portions using actual foods. Patient realized   guessing and then measuring that all her guesses were less than the actual amount.    6- Arrange follow up:  weekly x1 week, then biweekly x 5 months, evaluate 6 month progress. scheduled  for next Wednesday at 8:30 AM

## 2010-10-20 ENCOUNTER — Ambulatory Visit (INDEPENDENT_AMBULATORY_CARE_PROVIDER_SITE_OTHER): Payer: Medicare Other | Admitting: Internal Medicine

## 2010-10-20 ENCOUNTER — Encounter: Payer: Self-pay | Admitting: Internal Medicine

## 2010-10-20 DIAGNOSIS — E669 Obesity, unspecified: Secondary | ICD-10-CM

## 2010-10-20 DIAGNOSIS — M797 Fibromyalgia: Secondary | ICD-10-CM

## 2010-10-20 DIAGNOSIS — IMO0001 Reserved for inherently not codable concepts without codable children: Secondary | ICD-10-CM

## 2010-10-20 MED ORDER — TRAMADOL HCL 50 MG PO TABS: 50.0000 mg | ORAL_TABLET | Freq: Four times a day (QID) | ORAL | Status: DC | PRN

## 2010-10-20 NOTE — Assessment & Plan Note (Signed)
Patient is trying to lose weight and increase her level exercise as well as changing her diet. She has been seeing Dorthula Matas weekly she will see her on a scheduled basis for the next 6 months while she tries to lose weight. She has been increasing her level of activity which means she's been having increased levels of pain. She has been using her tramadol than usual. I will prescribe her not to take 2 in the morning, 2 at night, and one at lunch time if she needs it. I have advised her that she should taper this off as she is able to. I will see her back in 2 months to check on her pain level and hopefully at that point taper the tramadol dosing. I encouraged and supported her weight loss.

## 2010-10-20 NOTE — Patient Instructions (Signed)
You were seen today for a check on your pain medicine. We are giving you enough tramadol to take 2 in the morning, 2 at night, and 1 at lunch if you need it. Please taper the amount you are taking as you get used to working out and are having less pain. Continue to lose weight and work up and watch your diet. Continue seeing Jamison Neighbor as instructed to keep your weight loss goals! Keep up the good work! We will see you back in 2 months to check on your pain level.

## 2010-10-20 NOTE — Progress Notes (Signed)
Subjective:    Patient ID: Jordan [name], female    DOB: 02-04-1981, 30 y.o.   MRN: [id number]  HPI: The patient is a 30 year old female comes in today for an acute visit because she has been requesting pain medication sooner than his scheduled. She is to lose weight and has been increasing her level of activity. She does have fibromyalgia which does cause her pain from time to time. She normally takes lodine twice daily and tramadol once in the morning once at night which was controlling her level of pain along with Celexa 40 mg daily. However, since she has been working out and exercising to try to lose weight she's been having increased levels of pain. She states that she has been taking 2 tramadol morning and 2 at nighttime. This does seem to control her blood flow pain fairly well. However she would like to increase further her level of activity to give her some more weight loss. She states she is plateaued about 240 pounds. She is going to see Dorthula Matas on a regular basis to watch her diet and exercise. She states that she will be joining a [employer] next week and we'll start is more water-based activities which hopefully will alleviate some of the pain she's been having in her joints. She has no other problems at today's visit. No fevers, chills, shortness of breath, chest pain.    Review of Systems  Constitutional: Positive for activity change. Negative for fever, chills, diaphoresis, appetite change, fatigue and unexpected weight change.       Patient has been increasing her level of activity.  HENT: Negative.   Eyes: Negative.   Respiratory: Negative.  Negative for cough, chest tightness and shortness of breath.   Cardiovascular: Negative.  Negative for chest pain, palpitations and leg swelling.  Gastrointestinal: Negative.  Negative for nausea, abdominal pain, diarrhea and constipation.  Genitourinary: Negative.   Musculoskeletal: Positive for myalgias and arthralgias. Negative for back  pain, joint swelling and gait problem.       Patient does have pain in her knee, hip, shoulder joints.  Neurological: Negative.   Hematological: Negative.   Psychiatric/Behavioral: Negative.     Vitals: Blood pressure: 100/70 Pulse: 70 Temperature: 98.4 Fahrenheit Height 5 feet 9 inches  Weight 240 pounds    Objective:   Physical Exam  Constitutional: She is oriented to person, place, and time. She appears well-developed and well-nourished.  HENT:  Head: Normocephalic and atraumatic.  Eyes: EOM are normal. Pupils are equal, round, and reactive to light.  Neck: Normal range of motion. Neck supple. No tracheal deviation present. No thyromegaly present.  Cardiovascular: Normal rate, regular rhythm and normal heart sounds.   Pulmonary/Chest: Breath sounds normal. No respiratory distress. She has no wheezes. She has no rales. She exhibits no tenderness.  Abdominal: Soft. Bowel sounds are normal. She exhibits no distension. There is no tenderness. There is no rebound and no guarding.  Musculoskeletal: Normal range of motion. She exhibits tenderness. She exhibits no edema.  Lymphadenopathy:    She has no cervical adenopathy.  Neurological: She is alert and oriented to person, place, and time. No cranial nerve deficit.  Skin: Skin is warm and dry. No rash noted. No erythema. No pallor.  Psychiatric: She has a normal mood and affect. Her behavior is normal. Judgment and thought content normal.          Assessment & Plan:  1. Please see problem-oriented charting.  2. Health maintenance-we will discuss possible tetanus  shot at next visit in 2 months.  3. Disposition-patient will be seen back in 2 months. At that time we'll check in on her pain medication usage. I have advised her to taper the pain medication as she is able. Hopefully this will be a short time dosing as she is getting used to working out again. She was previously controlled on 2 tramadol daily.

## 2010-10-20 NOTE — Assessment & Plan Note (Signed)
Patient is currently trying to lose weight by increasing level of activity and modifying her diet. She is seeing Jamison Neighbor regularly. I did see congratulate her on her weight loss and encouraged her to continue the good work.

## 2010-10-20 NOTE — Progress Notes (Signed)
Ms. Mullinax' history and physical examination were reviewed with Dr. Dorise Hiss and the assessment and plan were formulated together.  I agree with the above documentation.

## 2010-10-23 ENCOUNTER — Other Ambulatory Visit: Payer: Self-pay | Admitting: Internal Medicine

## 2010-10-24 ENCOUNTER — Telehealth: Payer: Self-pay | Admitting: *Deleted

## 2010-10-24 NOTE — Telephone Encounter (Signed)
Fax from [employer].  Please clarify the instructions for Tramadol; should she be taking  1 tab q6h PRN or 2 in the morning, 2 at night, and 1 at lunch if needed.  Thanks

## 2010-10-25 ENCOUNTER — Ambulatory Visit (INDEPENDENT_AMBULATORY_CARE_PROVIDER_SITE_OTHER): Payer: Medicare Other | Admitting: Dietician

## 2010-10-25 ENCOUNTER — Ambulatory Visit: Payer: Medicare Other | Admitting: Dietician

## 2010-10-25 DIAGNOSIS — E669 Obesity, unspecified: Secondary | ICD-10-CM

## 2010-10-25 DIAGNOSIS — Z6835 Body mass index (BMI) 35.0-35.9, adult: Secondary | ICD-10-CM

## 2010-10-25 NOTE — Patient Instructions (Addendum)
Great Job on replacing high fat foods with low fat foods and trying to increase your activity!  Feel free to email your daily intake from [employer] and any comments about them.   Your weight was decreased 5 pounds today, at 241.1#  Please complete the Rate your Plate for 7 days in the next two weeks and bring back at your Next appointment on 9/19 @ 9:30 AM.

## 2010-10-25 NOTE — Telephone Encounter (Signed)
She should be taking up to 5 per day. 2 in the morning, 2 at night and 1 at lunch if needed. She should wean down as she is able given relief of pain and decreased overall in pain.

## 2010-10-25 NOTE — Progress Notes (Signed)
Intensive Behavioral Therapy for Obesity (visit #4 ) : Stat time:   9:30 AM End time: 10:00 AM  1. Height:69"    Weight today:241.1#      BMI:35.7                  Patient Goal weight: 180#             6 month (04/06/2011) goal weight: 226.6# 2. Assessment:  No Dietary (nutritional) assessment today as patient did not print out from [employer] mobile app or  send via email. Weight is decreasing.   Physical activity includes:  Less activity again due to pain from fibromyalgia, to try doing smaller   amounts of activity several times a day for total of 45 minutes a day. 3- Advise:  Behavior change advise:total calories matter, eating healthy to include all nutrients is important.   Reviewed "How to Eat lower fat" skills from last week and learned new skill: Healthy Eating",   planning meal choices to include recommended servings from all food groups.   4- Agreement on Goals:  For next week:plan meals to eat recommended servings from all foods groups and eat slower.  For next 6 months:attend sessions and weight loss of 7% ( ~226#)  In next year: not set  Long term: 180# 5- Assisted today with: knowing what a well balanced meal plan is and how her intake compares to what is  recommended as healthy.    6- Arrange follow up: every  2 weeks x 5 months, evaluate 6 month progress. (04/06/2011)

## 2010-11-08 ENCOUNTER — Ambulatory Visit (INDEPENDENT_AMBULATORY_CARE_PROVIDER_SITE_OTHER): Payer: Medicare Other | Admitting: Dietician

## 2010-11-08 ENCOUNTER — Ambulatory Visit: Payer: Medicare Other | Admitting: Dietician

## 2010-11-08 ENCOUNTER — Encounter: Payer: Self-pay | Admitting: Dietician

## 2010-11-08 DIAGNOSIS — Z6835 Body mass index (BMI) 35.0-35.9, adult: Secondary | ICD-10-CM

## 2010-11-08 NOTE — Progress Notes (Signed)
Intensive Behavioral Therapy for Obesity (visit #4 ) : Stat time:   9:45 AM End time: 10:05 AM  1. Height:69"    Weight today:238.41#      BMI:35.2                  Patient Goal weight: 180#             6 month (04/06/2011) goal weight: 226.6# 2. Assessment:  Dietary (nutritional) assessment for past 2 weeks per[employer] mobile app sent via email shows  improving nutritional pattern, food choices and realization of how to meal plan to obtain higher   nutrient density foods.  Weight is decreasing.   Physical activity includes:  Activity reported as on target. Patient to email records. 3- Advise:  Behavior change advise:total calories matter, eating healthy to include all nutrients is important.   Reviewed skills from last week on healthy Eating. Patient self identified that she needs to take a   conscious effort to fit more vegetables in her meal plan.and learned new skill:  "Take Charge of   what's Around You"  4- Agreement on Goals:  For next week:finish reading and complete worksheet on food Cues and bring to next session to   reveiw.  For next 6 months:attend sessions and weight loss of 7% ( ~226#)  In next year: not set  Long term: 180# 5- Assisted today with: Support, identifying support at home for positive behaviors and cues for less healthy  eating    6- Arrange follow up: every  2 weeks x 5 months, evaluate 6 month progress. (04/06/2011)

## 2010-11-14 LAB — HIV-1 RNA QUANT-NO REFLEX-BLD
HIV 1 RNA Quant: <50 copies/mL (ref <50)
HIV-1 RNA Quant, Log: <1.7 (ref <1.70)

## 2010-11-14 LAB — URINALYSIS, MICROSCOPIC ONLY
Bilirubin Urine: NEGATIVE
Glucose, UA: NEGATIVE
Hgb urine dipstick: NEGATIVE
Ketones, ur: NEGATIVE
Nitrite: NEGATIVE
Protein, ur: NEGATIVE
Specific Gravity, Urine: 1.021
Urobilinogen, UA: 1
pH: 6.5

## 2010-11-14 LAB — URINE CULTURE
Colony Count: 1000
Special Requests: NEGATIVE

## 2010-11-14 LAB — ANAEROBIC CULTURE: Gram Stain: NONE SEEN

## 2010-11-14 LAB — VIRUS CULTURE

## 2010-11-14 LAB — CULTURE, BLOOD (ROUTINE X 2)
Culture: NO GROWTH
Culture: NO GROWTH

## 2010-11-14 LAB — MISCELLANEOUS TEST

## 2010-11-14 LAB — BASIC METABOLIC PANEL
BUN: 10
BUN: 8
CO2: 22
CO2: 24
Calcium: 8.9
Calcium: 9
Chloride: 104
Chloride: 108
Creatinine, Ser: 0.69
Creatinine, Ser: 0.77
GFR calc Af Amer: >60
GFR calc Af Amer: >60
GFR calc non Af Amer: >60
GFR calc non Af Amer: >60
Glucose, Bld: 118 — ABNORMAL HIGH
Glucose, Bld: 85
Potassium: 3.8
Potassium: 4.1
Sodium: 134 — ABNORMAL LOW
Sodium: 138

## 2010-11-14 LAB — GC/CHLAMYDIA PROBE AMP, URINE
Chlamydia, Swab/Urine, PCR: NEGATIVE
GC Probe Amp, Urine: NEGATIVE

## 2010-11-14 LAB — CULTURE, ROUTINE-ABSCESS
Culture: NO GROWTH
Gram Stain: NONE SEEN

## 2010-11-14 LAB — GONOCOCCUS CULTURE: Culture: NO GROWTH

## 2010-11-14 LAB — CBC
HCT: 37.1
HCT: 39.5
Hemoglobin: 12.9
Hemoglobin: 13.8
MCHC: 34.7
MCHC: 34.9
MCV: 86.5
MCV: 86.7
Platelets: 161
Platelets: 175
RBC: 4.28
RBC: 4.57
RDW: 14
RDW: 14.1
WBC: 5.3
WBC: 5.4

## 2010-11-14 LAB — HIV ANTIBODY (ROUTINE TESTING W REFLEX): HIV: NONREACTIVE

## 2010-11-14 LAB — GC/CHLAMYDIA PROBE AMP, GENITAL
Chlamydia, DNA Probe: NEGATIVE
GC Probe Amp, Genital: NEGATIVE

## 2010-11-14 LAB — PREGNANCY, URINE: Preg Test, Ur: NEGATIVE

## 2010-11-15 ENCOUNTER — Telehealth: Payer: Self-pay | Admitting: Dietician

## 2010-11-15 NOTE — Telephone Encounter (Signed)
Patient has been periodically emailikng her daily food records from mobile App called fat secret. She requested buddy status and fiber information. I suggested for now she email me the records when she desires feedback or has a question

## 2010-11-22 ENCOUNTER — Ambulatory Visit (INDEPENDENT_AMBULATORY_CARE_PROVIDER_SITE_OTHER): Payer: Medicare Other | Admitting: Dietician

## 2010-11-22 ENCOUNTER — Ambulatory Visit: Payer: Medicare Other | Admitting: Dietician

## 2010-11-22 DIAGNOSIS — E669 Obesity, unspecified: Secondary | ICD-10-CM

## 2010-11-22 NOTE — Progress Notes (Signed)
Patient here for visit, but only weighed today due to scheduling.

## 2010-11-29 ENCOUNTER — Telehealth: Payer: Self-pay | Admitting: *Deleted

## 2010-11-29 NOTE — Telephone Encounter (Signed)
Thank you :)

## 2010-11-29 NOTE — Telephone Encounter (Signed)
i have rec'd the form, dr Josem Kaufmann signed and it was faxed back, will be scanned

## 2010-11-29 NOTE — Telephone Encounter (Signed)
Mental health worker calls to say pt's care has been suspended with mental health because the forms have not been filled out and signed and returned to the worker. i will try to resolve this today.

## 2010-12-02 ENCOUNTER — Emergency Department (HOSPITAL_COMMUNITY)
Admission: EM | Admit: 2010-12-02 | Discharge: 2010-12-02 | Disposition: A | Discharge status: Home / Self Care | Payer: Medicare Other | Attending: Emergency Medicine | Admitting: Emergency Medicine

## 2010-12-02 ENCOUNTER — Emergency Department (HOSPITAL_COMMUNITY): Payer: Medicare Other

## 2010-12-02 DIAGNOSIS — G8929 Other chronic pain: Secondary | ICD-10-CM | POA: Insufficient documentation

## 2010-12-02 DIAGNOSIS — R0602 Shortness of breath: Secondary | ICD-10-CM | POA: Insufficient documentation

## 2010-12-02 DIAGNOSIS — M549 Dorsalgia, unspecified: Secondary | ICD-10-CM | POA: Insufficient documentation

## 2010-12-02 DIAGNOSIS — R071 Chest pain on breathing: Secondary | ICD-10-CM | POA: Insufficient documentation

## 2010-12-04 LAB — POCT PREGNANCY, URINE
Operator id: 297281
Preg Test, Ur: NEGATIVE

## 2010-12-06 ENCOUNTER — Ambulatory Visit (INDEPENDENT_AMBULATORY_CARE_PROVIDER_SITE_OTHER): Payer: Medicare Other | Admitting: Dietician

## 2010-12-06 ENCOUNTER — Ambulatory Visit: Payer: Medicare Other | Admitting: Dietician

## 2010-12-06 DIAGNOSIS — Z6834 Body mass index (BMI) 34.0-34.9, adult: Secondary | ICD-10-CM

## 2010-12-06 NOTE — Progress Notes (Signed)
Intensive Behavioral Therapy for Obesity (visit #4 ) : Stat time:   9:02 AM End time:   9:40 AM  1. Height:69"    Weight today:234.6#      BMI:34.7                  Patient Goal weight: 180#             6 month (04/06/2011) goal weight: 226.6#-229.4# 2. Assessment:  Dietary (nutritional) assessment for past 2 weeks per patient has remained healthy despite   increased stress due to family illness.  Weight is decreasing.   Physical activity includes:  Activity reported as ~50% of target. Patient did not email records. 3- Advise:  Behavior change advise:total calories matter, eating healthy to include all nutrients is important.   Reviewed skills from last week on healthy Eating. Patient self identified that she needs to take a   conscious effort to fit more vegetables in her meal plan.and learned new skill:  "Take Charge of   what's Around You"  4- Agreement on Goals:  For next week:finish reading and complete worksheet on Tipping the calorie Balance and keep food  records x 3 days minimum for reveiw  For next 6 months:attend sessions and weight loss of 7% (~226# - 229# )  In next year: not set  Long term: 180# 5- Assisted today with: Support, identifying support at home for positive behaviors and cues for less healthy  Eating and more activity.    6- Arrange follow up: every  2 weeks x 5 months, evaluate 6 month progress. (04/06/2011)

## 2010-12-06 NOTE — Patient Instructions (Signed)
Please finish handout on tipping the calorie balance about planning for more acitivity and answer questions under chart.  Recommend keeping at least 3 days of food records in next 2 weeks. If you do these on [employer], can you please email them to me?   As today's handout said, you are really doing [place] work!!! Please do not hesitate to being up other topics you may want help on such as how to handle holidays, emotional eating, etc... (I have included a Halloween and Thanksgiving tip sheet)

## 2010-12-08 ENCOUNTER — Encounter: Payer: Self-pay | Admitting: Internal Medicine

## 2010-12-08 ENCOUNTER — Ambulatory Visit (INDEPENDENT_AMBULATORY_CARE_PROVIDER_SITE_OTHER): Payer: Medicare Other | Admitting: Internal Medicine

## 2010-12-08 VITALS — BP 94/65 | HR 68 | Temp 97.1°F | Ht 69.0 in | Wt 234.4 lb

## 2010-12-08 DIAGNOSIS — F419 Anxiety disorder, unspecified: Secondary | ICD-10-CM

## 2010-12-08 DIAGNOSIS — F411 Generalized anxiety disorder: Secondary | ICD-10-CM

## 2010-12-08 MED ORDER — SERTRALINE HCL 50 MG PO TABS: ORAL_TABLET | ORAL | Status: DC

## 2010-12-08 MED ORDER — CITALOPRAM HYDROBROMIDE 40 MG PO TABS: 40.0000 mg | ORAL_TABLET | Freq: Every day | ORAL | Status: DC

## 2010-12-08 NOTE — Progress Notes (Signed)
HPI: 1. F/u from ED for CP -dx-ed with a panic attack. Takes celexa for fibromyalgia. Lots of stress factors at home. Reports feeling better. No depression Sx. Denies SI/HI or mania.  Past Medical History  Diagnosis Date  . Basal cell carcinoma     recurrent in left maxillary, has had 5 surguries, last one 2003  . Fibroma     left ovarian  . Cyst of nasal sinus   . Candidiasis, vagina     recurrent, pt has made lifestyle modifications and none recently (as of 8/12)  . Gardnerella infection     + in 08/08  . [place]     hx of herpetic requiring I+D,( was bitten by autistic child that cares fr  . Allergic rhinitis     pt takes flonase for episodes, no episodes in 2012  . PLANTAR FASCIITIS, BILATERAL 05/24/2009  . Paronychia of third finger of left hand 07/28/2010   Current Outpatient Prescriptions  Medication Sig Dispense Refill  . sertraline (ZOLOFT) 50 MG tablet Please, take this medication along with celexa for 4 weeks. Then stop taking celexa and continue taking Zoloft. Pelase, call with any questions  30 tablet  2  . citalopram (CELEXA) 40 MG tablet Take 1 tablet (40 mg total) by mouth daily.  30 tablet  1  . etodolac (LODINE) 400 MG tablet Take 1 tablet (400 mg total) by mouth 2 (two) times daily.  60 tablet  6  . famotidine (PEPCID) 20 MG tablet Take 20 mg by mouth 2 (two) times daily.        . traMADol (ULTRAM) 50 MG tablet Take 1 tablet (50 mg total) by mouth every 6 (six) hours as needed for pain. Take 2 in the morning, 2 at night, if needed take 1 at lunch.  150 tablet  1  . DISCONTD: citalopram (CELEXA) 40 MG tablet TAKE ONE TABLET BY MOUTH EVERY DAY  30 tablet  6  . DISCONTD: citalopram (CELEXA) 40 MG tablet Take 1 tablet (40 mg total) by mouth daily.  30 tablet  1   Family History  Problem Relation Age of Onset  . Stomach cancer      uncle  . Breast cancer      two aunts  . Cancer      unknown grandfather  . Diabetes      grandmother, aunt and 2 uncles  . Diabetes  Maternal Grandmother    History   Social History  . Marital Status: Single    Spouse Name: N/A    Number of Children: N/A  . Years of Education: N/A   Social History Main Topics  . Smoking status: Never Smoker   . Smokeless tobacco: None  . Alcohol Use: No  . Drug Use: No  . Sexually Active: No   Other Topics Concern  . None   Social History Narrative  . None    Review of Systems: Constitutional: Denies fever, chills, diaphoresis, appetite change and fatigue.  HEENT: Denies photophobia, eye pain, redness, hearing loss, ear pain, congestion, sore throat, rhinorrhea, sneezing, mouth sores, trouble swallowing, neck pain, neck stiffness and tinnitus.  Respiratory: Denies SOB, DOE, cough, chest tightness, and wheezing.  Cardiovascular: Denies chest pain, palpitations and leg swelling.  Gastrointestinal: Denies nausea, vomiting, abdominal pain, diarrhea, constipation, blood in stool and abdominal distention.  Genitourinary: Denies dysuria, urgency, frequency, hematuria, flank pain and difficulty urinating.  Musculoskeletal: Denies myalgias, back pain, joint swelling, arthralgias and gait problem.  Skin: Denies pallor, rash  and wound.  Neurological: Denies dizziness, seizures, syncope, weakness, light-headedness, numbness and headaches.  Hematological: Denies adenopathy. Easy bruising, personal or family bleeding history  Psychiatric/Behavioral: Denies suicidal ideation, mood changes, confusion, nervousness, sleep disturbance and agitation   Vitals: reviewed General: alert, well-developed, and cooperative to examination.  Head: normocephalic and atraumatic.  Eyes: vision grossly intact, pupils equal, pupils round, pupils reactive to light, no injection and anicteric.  Mouth: pharynx pink and moist, no erythema, and no exudates.  Neck: supple, full ROM, no thyromegaly, no JVD, and no carotid bruits.  Lungs: normal respiratory effort, no accessory muscle use, normal breath sounds,  no crackles, and no wheezes. Heart: normal rate, regular rhythm, no murmur, no gallop, and no rub.  Abdomen: soft, non-tender, normal bowel sounds, no distention, no guarding, no rebound tenderness, no hepatomegaly, and no splenomegaly.  Msk: no joint swelling, no joint warmth, and no redness over joints.  Pulses: 2+ DP/PT pulses bilaterally Extremities: No cyanosis, clubbing, edema Neurologic: alert & oriented X3, cranial nerves II-XII intact, strength normal in all extremities, sensation intact to light touch, and gait normal.  Skin: turgor normal and no rashes.  Psych: Oriented X3, memory intact for recent and remote, normally interactive, good eye contact, not anxious appearing, and not depressed appearing.    Assessment & Plan: 1. Anxiety disorder -Will start Zoloft and continue with celexa for 4 more weeks before discontinuing it. - instructed to call 911 and/or go to ED if symptoms worsen -continue with counseling at [facility].

## 2010-12-08 NOTE — Patient Instructions (Signed)
Please, take your medications a instructed and call with any questions. F/U with [place] in 4 weeks or sooner if needed.

## 2010-12-08 NOTE — Progress Notes (Deleted)
  Subjective:    Patient ID: Jordan [name], female    DOB: 1981/01/19, 30 y.o.   MRN: [id number]  HPI    Review of Systems     Objective:   Physical Exam        Assessment & Plan:

## 2010-12-20 ENCOUNTER — Ambulatory Visit (INDEPENDENT_AMBULATORY_CARE_PROVIDER_SITE_OTHER): Payer: Medicare Other | Admitting: Dietician

## 2010-12-20 ENCOUNTER — Encounter: Payer: Self-pay | Admitting: Dietician

## 2010-12-20 ENCOUNTER — Ambulatory Visit: Payer: Medicare Other | Admitting: Dietician

## 2010-12-20 DIAGNOSIS — Z6834 Body mass index (BMI) 34.0-34.9, adult: Secondary | ICD-10-CM

## 2010-12-20 DIAGNOSIS — E669 Obesity, unspecified: Secondary | ICD-10-CM

## 2010-12-20 NOTE — Progress Notes (Signed)
Intensive Behavioral Therapy for Obesity (visit #7 ) : Stat time:   9:42 AM End time:   10:10 AM  1. Height:69"    Weight today:234.4#      BMI:34.7                  Patient Goal weight: 180#             6 month (04/06/2011) goal weight: 226.6#-229.4# 2. Assessment:  Dietary (nutritional) assessment for past 2 weeks per 5 day for record was 1775 total calories on  average andnad 60 grams fat.  Weight with little change. Physical activity includes: housework.  Advise:  Behavior change advise:Reviewed skills from last week on Tip The calorie Balance. She self   identified that she did well with calorie control, fat grams a bit high, and feels she needs to work on  planning time for activity to reach weight loss goals. Patient learned new information today about:   "Problem Solving"  4- Agreement on Goals:  For next week: Do action plan outlined on today's activity and try exercising in mornings setting   alarm clock to get up and going to bed at bedtime. Walk 30 minutes for 3 days a week for the next 2 weeks.  For next 6 months:attend sessions and weight loss of 7% (~226# - 229# )  In next year: not set  Long term: 180# 5- Assisted today with: Support, identifying support at home for positive behaviors and cues for less healthy  Eating and more activity.    6- Arrange follow up: every  2 weeks x 5 months, evaluate 6 month progress. (04/06/2011)

## 2010-12-26 ENCOUNTER — Other Ambulatory Visit: Payer: Self-pay | Admitting: *Deleted

## 2010-12-26 MED ORDER — TRAMADOL HCL 50 MG PO TABS: 50.0000 mg | ORAL_TABLET | Freq: Four times a day (QID) | ORAL | Status: DC | PRN

## 2010-12-28 ENCOUNTER — Encounter: Payer: Self-pay | Admitting: Internal Medicine

## 2010-12-28 ENCOUNTER — Telehealth: Payer: Self-pay | Admitting: *Deleted

## 2010-12-28 ENCOUNTER — Ambulatory Visit (INDEPENDENT_AMBULATORY_CARE_PROVIDER_SITE_OTHER): Payer: Medicare Other | Admitting: Internal Medicine

## 2010-12-28 VITALS — BP 91/61 | HR 66 | Temp 98.0°F | Ht 69.0 in | Wt 230.9 lb

## 2010-12-28 DIAGNOSIS — M79642 Pain in left hand: Secondary | ICD-10-CM

## 2010-12-28 DIAGNOSIS — M79609 Pain in unspecified limb: Secondary | ICD-10-CM

## 2010-12-28 MED ORDER — TRAMADOL HCL 50 MG PO TABS: 50.0000 mg | ORAL_TABLET | Freq: Four times a day (QID) | ORAL | Status: DC | PRN

## 2010-12-28 NOTE — Progress Notes (Signed)
  Subjective:    Patient ID: Jordan [name], female    DOB: 02-Feb-1981, 30 y.o.   MRN: [id number]  HPI  His Stahle is a 30 year old woman with past medical history significant for fibromyalgia, depression , chronic back pain who presents today for left hand pain.  #1 left hand pain-patient states that she was on her knees when she fell over and landed on her wrist. Patient states that she landed on the dorsum portion of her wrist. She noticed some increased swelling as well as pain however did not feel that there are any broken bones. She is here today for further evaluation.  No other complaints or concerns today.  Review of Systems  All other systems reviewed and are negative.       Objective:   Physical Exam  Musculoskeletal:       Left hand dorsum slightly swollen when compared to right hand. No overlying erythema. Normal extension and flexion of wrist. Normal abduction and abduction of the digits. Pain with grip strength. No increased warmth Patient able to make fist Phalen sign negative No abnormalities of the right hand          Assessment & Plan:

## 2010-12-28 NOTE — Patient Instructions (Signed)
If you notice increased swelling, pain, decreased range of motion, then please contact your provider and come in sooner.  Please take all of your medications as prescribed.

## 2010-12-28 NOTE — Telephone Encounter (Signed)
Pt calls c/o L hand pain from accident last pm, states she cannot move the hand and it is swollen, states she was on her knees and fell over into floor causing injury to hand. appt given for 1015, she is agreeable, appt per chilonb.

## 2010-12-28 NOTE — Assessment & Plan Note (Signed)
Secondary to fall. Dorsum of hand appears slightly swollen when compared to right hand however it is unlikely that patient has a fracture as patient has normal range of motion but slightly decreased grip strength secondary to pain. Patient was offered an x-ray for further evaluation to rule out fracture however patient declined as she would like to continue monitoring for now. This may be wrist sprain. Patient has history of carpal tunnel of the wrist and was asked if this may be an exacerbation of her carpal tunnel symptoms. Patient tried wearing a wrist splint last night which she states has helped. When doing Phalen and Tinel's sign patient did not exhibit worsening of symptoms. Advised patient to use ice to decrease inflammation. Will refill her tramadol prescription for pain relief. Advised patient to contact her provider if her swelling of her hand is worse as she may need further imaging to rule out fracture.

## 2011-01-03 ENCOUNTER — Ambulatory Visit (INDEPENDENT_AMBULATORY_CARE_PROVIDER_SITE_OTHER): Payer: Medicare Other | Admitting: Dietician

## 2011-01-03 ENCOUNTER — Ambulatory Visit: Payer: Medicare Other | Admitting: Dietician

## 2011-01-03 DIAGNOSIS — Z6834 Body mass index (BMI) 34.0-34.9, adult: Secondary | ICD-10-CM

## 2011-01-03 NOTE — Progress Notes (Signed)
Intensive Behavioral Therapy for Obesity (visit #8 ) : Stat time:   9:28 AM End time:   10:00 AM  1. Height:69"    Weight today:231.6#      BMI:34.3                   6 month (04/06/2011) goal weight: 226.6#-229.4#   12 month goal weight 10/04/11)-  Not yet determined               Patient Goal weight: 180# 2. Assessment:  Dietary (nutritional) assessment for past 2 weeks per 3 day for record was 2200 total calories on  average and 81 grams fat, 22 grams saturated fat.  Weight continues to decrease at goal rate. Physical activity includes:  Walked 30 minutes three times a week for past 2 weeks.  Advise: needs to increase activity  Behavior change advise:Reviewed skills from last week on Problem Solving. She self   identified that she did well with calorie control, fat grams a bit high, and feels she needs to work on  planning time for activity to reach weight loss goals. Patient learned new information today about:   "Healthy Eating Out"  4- Agreement on Goals:  For next week: Do action plan: plan ahead by reading fast food book ahead of time, track weight,  activity and food intake, find out how many calories she uses to walk both 30 and 60 minutes. Walk  30 minutes for 3 days a week for the next 2 weeks.  For next 6 months:attend sessions and weight loss of 7% (~226# - 229# )  In next year: not set  Long term: 180# 5- Assisted today with: Support, identifying support at home for positive behaviors and cues for less healthy  Eating and more activity.    6- Arrange follow up: in 2 weeks, evaluate 6 month progress. (04/06/2011)

## 2011-01-05 ENCOUNTER — Encounter: Payer: Medicare Other | Admitting: Internal Medicine

## 2011-01-10 ENCOUNTER — Ambulatory Visit (INDEPENDENT_AMBULATORY_CARE_PROVIDER_SITE_OTHER): Payer: Medicare Other | Admitting: Internal Medicine

## 2011-01-10 DIAGNOSIS — IMO0001 Reserved for inherently not codable concepts without codable children: Secondary | ICD-10-CM

## 2011-01-10 DIAGNOSIS — E669 Obesity, unspecified: Secondary | ICD-10-CM

## 2011-01-10 DIAGNOSIS — M797 Fibromyalgia: Secondary | ICD-10-CM

## 2011-01-10 NOTE — Patient Instructions (Signed)
Fibromyalgia Fibromyalgia is a disorder that is often misunderstood. It is associated with muscular pains and tenderness that comes and goes. It is often associated with fatigue and sleep disturbances. Though it tends to be long-lasting, fibromyalgia is not life-threatening. CAUSES  The exact cause of fibromyalgia is unknown. People with certain gene types are predisposed to developing fibromyalgia and other conditions. Certain factors can play a role as triggers, such as:  Spine disorders.   Arthritis.   Severe injury (trauma) and other physical stressors.   Emotional stressors.  SYMPTOMS   The main symptom is pain and stiffness in the muscles and joints, which can vary over time.   Sleep and fatigue problems.  Other related symptoms may include:  Bowel and bladder problems.   Headaches.   Visual problems.   Problems with odors and noises.   Depression or mood changes.   Painful periods (dysmenorrhea).   Dryness of the skin or eyes.  DIAGNOSIS  There are no specific tests for diagnosing fibromyalgia. Patients can be diagnosed accurately from the specific symptoms they have. The diagnosis is made by determining that nothing else is causing the problems. TREATMENT  There is no cure. Management includes medicines and an active, healthy lifestyle. The goal is to enhance physical fitness, decrease pain, and improve sleep. HOME CARE INSTRUCTIONS   Only take over-the-counter or prescription medicines as directed by your caregiver. Sleeping pills, tranquilizers, and pain medicines may make your problems worse.   Low-impact aerobic exercise is very important and advised for treatment. At first, it may seem to make pain worse. Gradually increasing your tolerance will overcome this feeling.   Learning relaxation techniques and how to control stress will help you. Biofeedback, visual imagery, hypnosis, muscle relaxation, yoga, and meditation are all options.   Anti-inflammatory  medicines and physical therapy may provide short-term help.   Acupuncture or massage treatments may help.   Take muscle relaxant medicines as suggested by your caregiver.   Avoid stressful situations.   Plan a healthy lifestyle. This includes your diet, sleep, rest, exercise, and friends.   Find and practice a hobby you enjoy.   Join a fibromyalgia support group for interaction, ideas, and sharing advice. This may be helpful.  SEEK MEDICAL CARE IF:  You are not having good results or improvement from your treatment. FOR MORE INFORMATION  National Fibromyalgia Association: www.fmaware.org Arthritis Foundation: www.arthritis.org Document Released: 02/05/2005 Document Revised: 10/18/2010 Document Reviewed: 05/18/2009 [facility] Patient Information 2012 [place], [place].

## 2011-01-10 NOTE — Progress Notes (Signed)
  Subjective:    Patient ID: Jordan [name], female    DOB: 11/18/1980, 30 y.o.   MRN: [id number]  HPI  Patient is a 30 year old female with past medical history most significant for fibromyalgia. I have not seen this patient in the past and this is my first office visit with her.  This office visit is mostly for clarification of medications and a new refill for Celexa.  She comes in today with complaints of arm pain, the pain is described as chronic aching pain, 5/10 in intensity, nonradiating, present throughout the day, no exacerbating or relieving factors. Patient says that more recently she was started on Zoloft which she hasn't been able to tolerate. She states that although she was supposed to taper her Celexa down she restarted taking her Celexa about a week ago and is feeling a little bit better. Recent also complains of swelling in her left leg which is on and off, it is not present at this time. There are no exacerbating factors and as per patient "it just appears by itself". Patient has no history of DVT in the past. She does not smoke and is not taking birth control pills at this time.       Review of Systems  Constitutional: Negative for fever, activity change and appetite change.  HENT: Negative for sore throat.   Respiratory: Negative for cough and shortness of breath.   Cardiovascular: Positive for leg swelling (on and off as discussed in hpi). Negative for chest pain.  Gastrointestinal: Negative for nausea, abdominal pain, diarrhea, constipation and abdominal distention.  Genitourinary: Negative for frequency, hematuria and difficulty urinating.  Musculoskeletal: Positive for myalgias and arthralgias.  Neurological: Negative for dizziness and headaches.  Psychiatric/Behavioral: Negative for suicidal ideas and behavioral problems.       Objective:   Physical Exam  Constitutional: She is oriented to person, place, and time. She appears well-developed and  well-nourished.  HENT:  Head: Normocephalic and atraumatic.  Eyes: Conjunctivae and EOM are normal. Pupils are equal, round, and reactive to light. No scleral icterus.  Neck: Normal range of motion. Neck supple. No JVD present. No thyromegaly present.  Cardiovascular: Normal rate, regular rhythm, normal heart sounds and intact distal pulses.  Exam reveals no gallop and no friction rub.   No murmur heard. Pulmonary/Chest: Effort normal and breath sounds normal. No respiratory distress. She has no wheezes. She has no rales.  Abdominal: Soft. Bowel sounds are normal. She exhibits no distension and no mass. There is no tenderness. There is no rebound and no guarding.  Musculoskeletal: Normal range of motion. She exhibits no edema and no tenderness.  Lymphadenopathy:    She has no cervical adenopathy.  Neurological: She is alert and oriented to person, place, and time.  Psychiatric: She has a normal mood and affect. Her behavior is normal.          Assessment & Plan:

## 2011-01-13 ENCOUNTER — Encounter: Payer: Self-pay | Admitting: Internal Medicine

## 2011-01-13 NOTE — Assessment & Plan Note (Signed)
Patient has lost about 30 pounds since the last 6-7 months and I congratulated her on that.

## 2011-01-13 NOTE — Assessment & Plan Note (Signed)
Patient was counseled about her condition and was given instructions at discharge. However discontinue Zoloft at this time and continue her Celexa. I have asked her to taper the dose of zoloft to 1 tab every other day for next 10 days before complete stop.

## 2011-01-17 ENCOUNTER — Ambulatory Visit: Payer: Medicare Other | Admitting: Dietician

## 2011-01-31 ENCOUNTER — Ambulatory Visit: Payer: Medicare Other | Admitting: Dietician

## 2011-02-01 ENCOUNTER — Other Ambulatory Visit: Payer: Self-pay | Admitting: Family Medicine

## 2011-02-01 NOTE — Telephone Encounter (Signed)
Refill request

## 2011-02-02 ENCOUNTER — Encounter: Payer: Self-pay | Admitting: Internal Medicine

## 2011-02-02 ENCOUNTER — Ambulatory Visit (INDEPENDENT_AMBULATORY_CARE_PROVIDER_SITE_OTHER): Payer: Medicare Other | Admitting: Internal Medicine

## 2011-02-02 VITALS — BP 92/60 | HR 66 | Temp 97.4°F | Ht 69.0 in | Wt 234.7 lb

## 2011-02-02 DIAGNOSIS — IMO0001 Reserved for inherently not codable concepts without codable children: Secondary | ICD-10-CM

## 2011-02-02 DIAGNOSIS — F410 Panic disorder [episodic paroxysmal anxiety] without agoraphobia: Secondary | ICD-10-CM | POA: Insufficient documentation

## 2011-02-02 DIAGNOSIS — M797 Fibromyalgia: Secondary | ICD-10-CM

## 2011-02-02 DIAGNOSIS — Z23 Encounter for immunization: Secondary | ICD-10-CM

## 2011-02-02 MED ORDER — ALPRAZOLAM 0.5 MG PO TABS: 0.5000 mg | ORAL_TABLET | ORAL | Status: AC | PRN

## 2011-02-02 MED ORDER — TRAMADOL HCL 50 MG PO TABS: 50.0000 mg | ORAL_TABLET | Freq: Three times a day (TID) | ORAL | Status: DC | PRN

## 2011-02-02 NOTE — Progress Notes (Signed)
  Subjective:    Patient ID: Jordan [name], female    DOB: 10-21-1980, 30 y.o.   MRN: [id number]  HPI  Patient is a 30 year old female with a PMH listed below, presents to the [facility] for routine followup, would like refill on tramadol for fibromyalgia, and wants xanex for panic attacks, given that she had a further episode despite being on optimum dose of celexa. Would also like a flu shot today. Denies any other complaints.   Patient Active Problem List  Diagnoses  . CARCINOMA, BASAL CELL  . OBESITY  . CARPAL TUNNEL SYNDROME  . GERD  . BACK PAIN, CHRONIC  . OVARIAN CYSTECTOMY, HX OF  . Fibromyalgia  . Insomnia  . Depression  . Left hand pain   Current Outpatient Prescriptions on File Prior to Visit  Medication Sig Dispense Refill  . citalopram (CELEXA) 40 MG tablet Take 1 tablet (40 mg total) by mouth daily.  30 tablet  1  . etodolac (LODINE) 400 MG tablet Take 1 tablet (400 mg total) by mouth 2 (two) times daily.  60 tablet  6   No Known Allergies   Review of Systems  All other systems reviewed and are negative.       Objective:   Physical Exam  Nursing note and vitals reviewed. Constitutional: She is oriented to person, place, and time. She appears well-developed and well-nourished.  HENT:  Head: Normocephalic and atraumatic.  Eyes: Pupils are equal, round, and reactive to light.  Neck: Normal range of motion. Neck supple. No JVD present. No thyromegaly present.  Cardiovascular: Normal rate, regular rhythm and normal heart sounds.   No murmur heard. Pulmonary/Chest: Effort normal and breath sounds normal. She has no wheezes. She has no rales.  Abdominal: Soft. Bowel sounds are normal.  Musculoskeletal: Normal range of motion. She exhibits no edema.  Neurological: She is alert and oriented to person, place, and time.  Skin: Skin is warm and dry.

## 2011-02-02 NOTE — Assessment & Plan Note (Signed)
Under good control, continue tramadol and celexa.

## 2011-02-02 NOTE — Assessment & Plan Note (Signed)
Controlled with celexa, but due to sporadic infrequent attacks, will given 7 pills of xanex to carry and only take one as needed for a panic attack. Continue CBT with psych.

## 2011-02-02 NOTE — Patient Instructions (Signed)
Take alprazolam as needed only for panic attack

## 2011-02-14 ENCOUNTER — Ambulatory Visit: Payer: Medicare Other | Admitting: Dietician

## 2011-02-15 ENCOUNTER — Ambulatory Visit (INDEPENDENT_AMBULATORY_CARE_PROVIDER_SITE_OTHER): Payer: Medicare Other | Admitting: Dietician

## 2011-02-15 DIAGNOSIS — Z6835 Body mass index (BMI) 35.0-35.9, adult: Secondary | ICD-10-CM

## 2011-02-15 NOTE — Patient Instructions (Signed)
Bring food and activity records and folder to next visit.  Weigh today  Was 238.8#. This is decreased from 246 in August!!!!

## 2011-02-15 NOTE — Progress Notes (Signed)
Intensive Behavioral Therapy for Obesity (visit #9 ) : Stat time:   9:41 AM End time:   10:15 AM  1. Height:69"    Weight today:238.8#      BMI:35.3                   6 month (04/06/2011) goal weight: 226.6#-229.4#   12 month goal weight 10/04/11)-  Not yet determined               Patient Goal weight: 180# 2. Assessment:  Dietary (nutritional) assessment:Has not been keeping records because phone is not working.    Weight increased 7.2# in past month. Physical activity includes:  Walked 20 minutes two times a   week for past 4 weeks.  3. Advise: needs to increase activity and keep written records to assist with self monitoring and tracking   progress and set backs.  Behavior change advise:Reviewed skills from 4 weeks ago. Patient rates her eating out at 75%   choosing healthier foods. Problem Solving: Patient agreed to keep written records of food and   acitivity and well as her negative thoughts and bring to next visit. She will also bring folder with   calories burned in 30 and 60 minutes of walking. Patient learned new information today about:    "Controlling negative thoughts"  4- Agreement on Goals:  For next week: Do action plan: plan ahead by reading fast food book ahead of time, track weight,  activity and food intake, find out how many calories she uses to walk both 30 and 60 minutes. Be     active- Walk or ride bike 30 minutes for 3 days a week for the next 2 weeks.  For next 6 months:attend sessions and weight loss of 7% (~226# - 229# )  In next year: not set  Long term: 180# 5- Assisted today with: Support, identifying support at home for positive behaviors and cues for less healthy  Eating and more activity.    6- Arrange follow up: in 2 weeks, evaluate 6 month progress. (04/06/2011)

## 2011-02-27 ENCOUNTER — Encounter: Payer: Medicare Other | Admitting: Internal Medicine

## 2011-03-01 ENCOUNTER — Encounter: Payer: Self-pay | Admitting: Dietician

## 2011-03-01 ENCOUNTER — Ambulatory Visit (INDEPENDENT_AMBULATORY_CARE_PROVIDER_SITE_OTHER): Payer: Medicare Other | Admitting: Dietician

## 2011-03-01 VITALS — Ht 69.0 in

## 2011-03-01 DIAGNOSIS — Z6834 Body mass index (BMI) 34.0-34.9, adult: Secondary | ICD-10-CM

## 2011-03-01 NOTE — Progress Notes (Signed)
Intensive Behavioral Therapy for Obesity (visit #10 ) : Stat time:   9:15 AM End time:   10:00 AM  1. Height:69"    Weight today:234.6#      BMI: 34.7               6 month (04/06/2011) goal weight: 226.6#-229.4#   12 month goal weight 10/04/11)-  Not yet determined               Patient Goal weight: 180# 2. Assessment: weight decreased by ~ 4 pounds in past 2 weeks. Has lost total of 12 pounds/5.45 kg in  past 5 months. Meets Medicare criteria for continuing IBT for next 6 months. Brought folder and   records. email contact x1 in past 2 weeks.  Dietary (nutritional) assessment:average intake for past week:   1890 calories, goal is 1800   68 grams fat, goal is 45 grams  21.5 grams saturated fat , goal is <15 grams  14.5 grams fiber per day, goal is 20-30 grams   Physical activity includes: stretching, Walking and bike riding. Not doing on daily basis because of  pain and dental work.  3. Advise: New goals: 1800-1900 calories, 45-55 grams fat, < 15 grams sat fat and 20-30 grams fiber per  day set today.ds to increase activity and keep written records to assist with self monitoring and   tracking progress and set backs.  Behavior change advise:Reviewed skills: patient surprised to learn that stretching burned as many  calories as it does. About as much in 1 hour that walking did in 30 minutes.  Problem Solving:   Discussed replacing negative thoughts with positive thoughts, Patient agreed to keep written   records of food and activity and well as her negative thoughts and bring to next visit.  Patient learned  new information today about: 4- Agreement on Goals: patient desires to continue with 6 month weight loss goal:needs 5 # weight loss in 5  weeks   For next week: Post goals where you can see them. Do action plan: plan ahead for future slips and  holidays, ask for help,.  For next 6 months:attend sessions and weight loss of 7% (~226# - 230# )  In next year: not set  Long term: 180# 5- Assisted  today with: Support, identifying how to get back on track after a slip.    6- Arrange follow up: in 2 weeks, evaluate 6 month progress. (04/06/2011)

## 2011-03-15 ENCOUNTER — Ambulatory Visit (INDEPENDENT_AMBULATORY_CARE_PROVIDER_SITE_OTHER): Payer: Medicare Other | Admitting: Dietician

## 2011-03-15 DIAGNOSIS — Z6835 Body mass index (BMI) 35.0-35.9, adult: Secondary | ICD-10-CM

## 2011-03-15 NOTE — Progress Notes (Signed)
Intensive Behavioral Therapy for Obesity (visit #11 ) : Stat time:   9:35 AM End time:   10:00 AM  1. Height:69"    Weight today:237.5#      BMI: 35.1               6 month (04/06/2011) goal weight: 226.6#-229.4#   12 month goal weight (10/04/11)-  Not yet determined               Patient Goal weight: 180# 2. Assessment: weight decreased by ~ 4 pounds in past 2 weeks. Has lost total of ~9 pounds in  past 5.5  months. Meets Medicare criteria for continuing IBT for next 6 months. Brought folder and    records. Email contact today only over past 2 weeks  Dietary (nutritional) assessment:average intake for past 2 weeks:    1720 calories, goal is 1800-2000    62 grams fat, goal is 45-55 grams   21 grams saturated fat , goal is <15 grams   15 grams fiber per day, goal is 20-30 grams               Found error in records by [employer], suggested using food labels more frequently.    Physical activity includes: stretching daily, Walking daily and bike riding sometimes. Much improved  over past 2 weeks. 3. Advise: Review of goals: 1800-1900 calories, 45-55 grams fat, < 15 grams sat fat and 20-30 grams fiber  per day set today. Continue activity and keep written records to assist with self monitoring and   tracking progress and set backs.  Behavior change advise:Reviewed skills:  replacing negative thoughts with positive thoughts, Patient  agreed to keep written records of food and activity and well as her negative thoughts and bring to  next visit.  Patient learned new information today about:"You can manage stress" 4- Agreement on Goals: patient desires to continue with 6 month weight loss goal:needs 7.5 # weight loss in             3 weeks   For next week: Post how to take time out when stressed" where you can see them. . Do action plan  and bring to next visit.  For next 6 months:attend sessions and weight loss of 7% (~226# - 230# )  In next year: not set  Long term: 180# 5- Assisted today with:  Support, identifying how to get back on track after a slip.    6- Arrange follow up: in 2 weeks, evaluate 6 month progress. (04/06/2011)

## 2011-03-27 ENCOUNTER — Ambulatory Visit (INDEPENDENT_AMBULATORY_CARE_PROVIDER_SITE_OTHER): Payer: Medicare Other | Admitting: Internal Medicine

## 2011-03-27 ENCOUNTER — Encounter: Payer: Self-pay | Admitting: Internal Medicine

## 2011-03-27 ENCOUNTER — Other Ambulatory Visit (HOSPITAL_COMMUNITY)
Admission: RE | Admit: 2011-03-27 | Discharge: 2011-03-27 | Disposition: A | Discharge status: Home / Self Care | Payer: Medicare Other | Source: Ambulatory Visit | Attending: Internal Medicine | Admitting: Internal Medicine

## 2011-03-27 DIAGNOSIS — Z9889 Other specified postprocedural states: Secondary | ICD-10-CM

## 2011-03-27 DIAGNOSIS — IMO0001 Reserved for inherently not codable concepts without codable children: Secondary | ICD-10-CM | POA: Diagnosis not present

## 2011-03-27 DIAGNOSIS — R102 Pelvic and perineal pain: Secondary | ICD-10-CM

## 2011-03-27 DIAGNOSIS — N898 Other specified noninflammatory disorders of vagina: Secondary | ICD-10-CM | POA: Diagnosis not present

## 2011-03-27 DIAGNOSIS — Z124 Encounter for screening for malignant neoplasm of cervix: Secondary | ICD-10-CM | POA: Diagnosis not present

## 2011-03-27 DIAGNOSIS — F419 Anxiety disorder, unspecified: Secondary | ICD-10-CM

## 2011-03-27 DIAGNOSIS — N83209 Unspecified ovarian cyst, unspecified side: Secondary | ICD-10-CM

## 2011-03-27 DIAGNOSIS — N949 Unspecified condition associated with female genital organs and menstrual cycle: Secondary | ICD-10-CM | POA: Diagnosis not present

## 2011-03-27 DIAGNOSIS — M797 Fibromyalgia: Secondary | ICD-10-CM

## 2011-03-27 MED ORDER — CITALOPRAM HYDROBROMIDE 40 MG PO TABS: 40.0000 mg | ORAL_TABLET | Freq: Every day | ORAL | Status: DC

## 2011-03-27 MED ORDER — TRAMADOL HCL 50 MG PO TABS: ORAL_TABLET | ORAL | Status: DC

## 2011-03-27 MED ORDER — TRAMADOL HCL 50 MG PO TABS: 50.0000 mg | ORAL_TABLET | Freq: Three times a day (TID) | ORAL | Status: DC | PRN

## 2011-03-27 NOTE — Assessment & Plan Note (Signed)
Pt says that her OB retired and that she would like to establish care with a new one.  Especially in light of her cervical findings, I will refer to OB/GYN. - refer to OB/GYN

## 2011-03-27 NOTE — Assessment & Plan Note (Signed)
Pt had not been getting enough tramadol.  Says a good regimen for her is 2 in the morning, 1 in afternoon, and 2 in evening.  That would be 150/month, so I rx'ed 160.  She is working with Lupita Leash P to lose weight and stay active.  I offered referral to a pain clinic, but she is reluctant to have another doctor at this time.  She would like to try the tramadol and activity.  She also c/o b/l HA behind the ears, which could be related to her chronic pain. - con't tramadol - con't celexa - consider referral to pain clinic if pt is amenable

## 2011-03-27 NOTE — Assessment & Plan Note (Signed)
Pt c/o of three weeks sharp stabbing vaginal pain with white vaginal d/c.  No dysuria, no itching.  Pelvic exam shows white discharge and cervical lesion at 3 o clock.  Last pap was wnl in 09/2009.  Unclear what lesion may represent, so will refer to OB/GYN - pap smear - GC/CT - wet mount - refer to OB/GYN

## 2011-03-27 NOTE — Progress Notes (Signed)
Subjective:     Patient ID: Jordan [name], female   DOB: 1980/09/21, 31 y.o.   MRN: [id number]  HPI 31 yo F with h/o fibromyalgia, anxiety c panic attacks, depression, and h/o ovarian cystectomy who presents for f/u.  Pt c/o 3 wks sharp, electric stabbing vaginal pain.  There are no triggers, and the pain is severe but fleeting.  LMP 1/29.  Pain started before and persisted through cycle.  Also notes white vaginal discharge, no itching.  No dysuria.  No recent sexual acvitiy, no h/o STD.  Pt c/o of poorly controlled pain throughout her body.  She used to have a regimen of tramadol that worked for her, but her last refill was not enough.  She is working with Lupita Leash P to lose weight and stay active.  Her mood is good, she has not had any recent panic attacks.  Review of Systems B/l HA behind ears    Objective:   Physical Exam Gen: NAD, pleasant, appropriate HEENT: no TTP behind ears, no erythema or lesions. GU: normal appearing external genitalia.  white d/c present in vaginal vault.  Cervix visualized, roughly peanut sized lesion at 3 o'clock that is firm with white specs.  Non-friable, no exudate.  Rest of visualized cervix appears normal.    Assessment:         Plan:

## 2011-03-28 ENCOUNTER — Telehealth: Payer: Self-pay | Admitting: *Deleted

## 2011-03-28 ENCOUNTER — Other Ambulatory Visit: Payer: Self-pay | Admitting: Internal Medicine

## 2011-03-28 LAB — WET PREP BY MOLECULAR PROBE
Candida species: POSITIVE — AB
Gardnerella vaginalis: NEGATIVE
Trichomonas vaginosis: NEGATIVE

## 2011-03-28 LAB — GC/CHLAMYDIA PROBE AMP, GENITAL
Chlamydia, DNA Probe: NEGATIVE
GC Probe Amp, Genital: NEGATIVE

## 2011-03-28 MED ORDER — FLUCONAZOLE 100 MG PO TABS: 150.0000 mg | ORAL_TABLET | Freq: Every day | ORAL | Status: AC

## 2011-03-28 NOTE — Telephone Encounter (Signed)
Call to pt to inform her of the need for treatment of a Yeast Infection.  Pt was informed that a prescription for Diflucan has been called to [facility]  Or she can get an OTC Yeast medication per Dr. Berlinda Last.  Pt voiced understanding and will pick up the prescription for the Diflucan at her pharmacy.  Angelina Ok, RN 03/28/2011 4:01 PM.

## 2011-03-29 ENCOUNTER — Ambulatory Visit: Payer: Medicare Other | Admitting: Dietician

## 2011-04-12 ENCOUNTER — Ambulatory Visit: Payer: Medicare Other | Admitting: Dietician

## 2011-04-18 ENCOUNTER — Telehealth: Payer: Self-pay | Admitting: *Deleted

## 2011-04-19 ENCOUNTER — Encounter: Payer: Medicare Other | Admitting: Obstetrics and Gynecology

## 2011-04-26 ENCOUNTER — Ambulatory Visit (INDEPENDENT_AMBULATORY_CARE_PROVIDER_SITE_OTHER): Payer: Medicare Other | Admitting: Dietician

## 2011-04-26 DIAGNOSIS — Z6834 Body mass index (BMI) 34.0-34.9, adult: Secondary | ICD-10-CM | POA: Diagnosis not present

## 2011-04-26 NOTE — Progress Notes (Signed)
Intensive Behavioral Therapy for Obesity (visit #12 ) : Start time:   9:40 AM End time:   10:20 AM  1. Assessment: Height:69"    Weight today:229.4#      Start weight 246.6#  weight change:17.2#               BMI at start on 10/04/11 was 36.4 decreased to 34.0 today              6 month (04/06/2011) goal weight: 226.6#-229.4#- met   12 month goal weight (10/04/11)- 214#-219#               Patient Goal weight Long term: 180#weight decreased by ~ 6-7 pounds in past 4 weeks.  Meets Medicare criteria for    continuing IBT for next 6 months. Email contact between visits.   Patient reports she is keeping food and activity records and is now participiating in an online   support group for increased activity.    Physical activity includes: stretching daily, Walking 3 days a week for 30 minutes. 2. Advise: Review of goals: Continue activity and keep written records to assist with self monitoring and   tracking progress and set backs.  Behavior change advise:Reviewed skills: Managing stress, pain and emotions identified as   triggers: consider using HALT(hungry , angry, lonely,tired) and Gratitude list when stressed.   New  information today about:"Jump start your activity plan" 3. Agreement on Goals:   For next 2 weeks: Post how to take time out when stressed" where you can see them. . Do action  plan and bring to next visit.  For next 5.5 months:attend sessions and weight loss of 6-7% (~214-219# )  In next year: not set  Long term: 180# 4.  Assisted today with: Support, goal setting   5.  Arrange follow up: in 2 weeks, evaluate 9 and 12 month progress. (07/04/11 and 10/04/2011)

## 2011-04-30 NOTE — Progress Notes (Signed)
Addended by: Neomia Dear on: 04/30/2011 07:35 PM   Modules accepted: Orders

## 2011-05-08 ENCOUNTER — Encounter: Payer: Self-pay | Admitting: Internal Medicine

## 2011-05-08 ENCOUNTER — Ambulatory Visit (INDEPENDENT_AMBULATORY_CARE_PROVIDER_SITE_OTHER): Payer: Medicare Other | Admitting: Internal Medicine

## 2011-05-08 VITALS — BP 91/60 | HR 66 | Temp 97.5°F | Ht 69.0 in | Wt 237.6 lb

## 2011-05-08 DIAGNOSIS — F410 Panic disorder [episodic paroxysmal anxiety] without agoraphobia: Secondary | ICD-10-CM

## 2011-05-08 DIAGNOSIS — F329 Major depressive disorder, single episode, unspecified: Secondary | ICD-10-CM | POA: Diagnosis not present

## 2011-05-08 DIAGNOSIS — F32A Depression, unspecified: Secondary | ICD-10-CM

## 2011-05-08 DIAGNOSIS — G4700 Insomnia, unspecified: Secondary | ICD-10-CM | POA: Diagnosis not present

## 2011-05-08 DIAGNOSIS — IMO0001 Reserved for inherently not codable concepts without codable children: Secondary | ICD-10-CM

## 2011-05-08 DIAGNOSIS — M797 Fibromyalgia: Secondary | ICD-10-CM

## 2011-05-08 DIAGNOSIS — F3289 Other specified depressive episodes: Secondary | ICD-10-CM

## 2011-05-08 MED ORDER — TRAZODONE HCL 100 MG PO TABS: 100.0000 mg | ORAL_TABLET | Freq: Every day | ORAL | Status: DC

## 2011-05-08 NOTE — Progress Notes (Signed)
Subjective:     Patient ID: Jordan [name], female   DOB: 1980/03/27, 31 y.o.   MRN: [id number]  HPI  Patient with past medical history of depression, insomnia, panic attacks, and fibromyalgia presents today for refill of Celexa, etodolac, tramadol, and is requesting trazodone for sleep. In addition she requests a letter for vocational rehabilitation services stating that she is physically able to seek employment.    Review of Systems  Constitutional: Negative for fever.  HENT: Negative for congestion.   Eyes: Negative for visual disturbance.  Respiratory: Negative for shortness of breath.   Cardiovascular: Negative for chest pain.  Genitourinary: Negative for dysuria.  Musculoskeletal:       Multiple muscle tenderness throughout  Neurological: Negative for weakness and headaches.       Objective:   Physical Exam  Constitutional: She is oriented to person, place, and time. She appears well-developed and well-nourished. No distress.  HENT:  Head: Normocephalic and atraumatic.  Eyes: Conjunctivae and EOM are normal. Pupils are equal, round, and reactive to light.  Neck: Normal range of motion. Neck supple.  Cardiovascular: Normal rate, regular rhythm, normal heart sounds and intact distal pulses.   Pulmonary/Chest: Effort normal and breath sounds normal. She has no wheezes.  Abdominal: Soft. Bowel sounds are normal. There is no tenderness.  Musculoskeletal: Normal range of motion. She exhibits no edema.       Right shoulder: She exhibits tenderness.       Left shoulder: She exhibits tenderness.       Right elbow: tenderness found. Medial epicondyle tenderness noted.       Left elbow: tenderness found. Medial epicondyle tenderness noted.       Right wrist: She exhibits tenderness.       Left wrist: She exhibits tenderness.       Right knee: tenderness found.       Left knee: tenderness found.       Arms: Neurological: She is alert and oriented to person, place, and time.  Skin:  Skin is warm and dry.  Psychiatric: She has a normal mood and affect. Her behavior is normal. Judgment and thought content normal.       Assessment:     #1 fibromyalgia: Stable on citalopram, etodolac and tramadol #2 insomnia: The patient reports difficulty in initiating sleep #3 depression and panic attacks: Stable on citalopram    Plan:     -Will refill medicine regimen  -Will re-prescribe trazodone 100 mg each bedtime for sleep  -Letter provided approving physical capability to seek employment

## 2011-05-15 ENCOUNTER — Other Ambulatory Visit: Payer: Self-pay | Admitting: Family Medicine

## 2011-05-15 DIAGNOSIS — M797 Fibromyalgia: Secondary | ICD-10-CM

## 2011-05-22 ENCOUNTER — Ambulatory Visit: Payer: Medicare Other | Admitting: Dietician

## 2011-05-22 NOTE — Progress Notes (Signed)
Intensive Behavioral Therapy for Obesity (visit #13 ) : Start time:   9:20 AM End time:    9:50 AM  1. Assessment: Height:69"    Weight today:234.6#      Start weight 246.6#  weight change:12#               BMI at start on 10/04/11 was 36.4 decreased to 34.7 today              6 month goal weight: 226.6#-229.4#- met, restarted   12 month goal weight (10/04/11)- 214#-219#               Patient Goal weight Long term: 180#.      Patient reports she is keeping food and activity records and continues to participiate in an online    support group for increased activity.    Physical activity includes: stretching daily, Walking 3 days a week for 45 minutes. New activity   goal is to exercise for 50  Minutes 4-5 days of the week. 2. Advise: Review of goals: Continue activity, written food records and weekly weights to assist with self  monitoring and tracking progress and set backs. Complete handouts today on Making social cues  work for you, getting support for myself, and ways to stay motivated.  Behavior change advise: Patient to email food records and communicate 2x monthly with   RD/Coach.  3. Agreement on Goals:   For next 6 months: Complete handouts, email records, continue with calorie goal fo 1800-1900   calories and 45-50 grams fat per day.  For next 5.5 months:attend sessions and weight loss of 6-7% (~214-219# )  In next year: not set  Long term: 180# 4.  Assisted today with: Support, goal setting, find ways to self support continued healthy behavior   changes  Accomplished thus far 5.  Arrange follow up: evaluate 9 and 12 month progress by email (07/04/11 and 10/04/2011)

## 2011-05-25 ENCOUNTER — Other Ambulatory Visit: Payer: Self-pay | Admitting: *Deleted

## 2011-05-25 DIAGNOSIS — M797 Fibromyalgia: Secondary | ICD-10-CM

## 2011-05-26 MED ORDER — TRAMADOL HCL 50 MG PO TABS: ORAL_TABLET | ORAL | Status: DC

## 2011-05-28 ENCOUNTER — Emergency Department (INDEPENDENT_AMBULATORY_CARE_PROVIDER_SITE_OTHER)
Admission: EM | Admit: 2011-05-28 | Discharge: 2011-05-28 | Disposition: A | Discharge status: Home / Self Care | Payer: Medicare Other | Source: Home / Self Care | Attending: Family Medicine | Admitting: Family Medicine

## 2011-05-28 ENCOUNTER — Telehealth: Payer: Self-pay | Admitting: *Deleted

## 2011-05-28 ENCOUNTER — Encounter (HOSPITAL_COMMUNITY): Payer: Self-pay

## 2011-05-28 DIAGNOSIS — J392 Other diseases of pharynx: Secondary | ICD-10-CM

## 2011-05-28 HISTORY — DX: Fibromyalgia: M79.7

## 2011-05-28 HISTORY — DX: Reserved for concepts with insufficient information to code with codable children: IMO0002

## 2011-05-28 LAB — POCT RAPID STREP A: Streptococcus, Group A Screen (Direct): NEGATIVE

## 2011-05-28 NOTE — Discharge Instructions (Signed)

## 2011-05-28 NOTE — ED Notes (Signed)
C/o soreness, swelling of palate, tongue and back of mouth since last night.  States it felt swollen earlier

## 2011-05-28 NOTE — ED Provider Notes (Signed)
Medical screening examination/treatment/procedure(s) were performed by non-physician practitioner and as supervising physician I was immediately available for consultation/collaboration.  Alen Bleacher, MD 05/28/11 2228

## 2011-05-28 NOTE — ED Provider Notes (Signed)
History     CSN: [id number]  Arrival date & time 05/28/11  1729   None     No chief complaint on file.   (Consider location/radiation/quality/duration/timing/severity/associated sxs/prior treatment) Patient is a 31 y.o. female presenting with pharyngitis. The history is provided by the patient. No language interpreter was used.  Sore Throat This is a new problem. The current episode started yesterday. The problem occurs constantly. The problem has not changed since onset.Pertinent negatives include no chest pain and no headaches. The symptoms are aggravated by nothing. The symptoms are relieved by nothing. She has tried nothing for the symptoms.  Pt complains of soreness in the top of her mouth and her throat.  No fever.   Past Medical History  Diagnosis Date  . Basal cell carcinoma     recurrent in left maxillary, has had 5 surguries, last one 2003  . Fibroma     left ovarian  . Cyst of nasal sinus   . Candidiasis, vagina     recurrent, pt has made lifestyle modifications and none recently (as of 8/12)  . Gardnerella infection     + in 08/08  . [place]     hx of herpetic requiring I+D,( was bitten by autistic child that cares fr  . Allergic rhinitis     pt takes flonase for episodes, no episodes in 2012  . PLANTAR FASCIITIS, BILATERAL 05/24/2009  . Paronychia of third finger of left hand 07/28/2010    Past Surgical History  Procedure Date  . Left oophorectomy   . Cholecystectomy   . Nasal sinus surgery     X 2 at age 23 & 64    Family History  Problem Relation Age of Onset  . Stomach cancer      uncle  . Breast cancer      two aunts  . Cancer      unknown grandfather  . Diabetes      grandmother, aunt and 2 uncles  . Diabetes Maternal Grandmother     History  Substance Use Topics  . Smoking status: Never Smoker   . Smokeless tobacco: Not on file  . Alcohol Use: No    OB History    Grav Para Term Preterm Abortions TAB SAB Ect Mult Living                   Review of Systems  Cardiovascular: Negative for chest pain.  Neurological: Negative for headaches.  All other systems reviewed and are negative.    Allergies  Review of patient's allergies indicates no known allergies.  Home Medications   Current Outpatient Rx  Name Route Sig Dispense Refill  . CITALOPRAM HYDROBROMIDE 40 MG PO TABS Oral Take 1 tablet (40 mg total) by mouth daily. 30 tablet 1  . ETODOLAC 400 MG PO TABS  TAKE ONE TABLET BY MOUTH TWICE DAILY 60 tablet 6  . TRAMADOL HCL 50 MG PO TABS  Take 2 tabs in a.m., 1 tab in afternoon, 2 in evening as needed for pain. 160 tablet 1  . TRAZODONE HCL 100 MG PO TABS Oral Take 1 tablet (100 mg total) by mouth at bedtime. 30 tablet 6    BP 121/81  Pulse 68  Temp(Src) 98.4 F (36.9 C) (Oral)  Resp 18  SpO2 100%  LMP 03/26/2011  Physical Exam  Vitals reviewed. Constitutional: She appears well-developed and well-nourished.  HENT:  Head: Normocephalic and atraumatic.  Right Ear: External ear normal.  Left Ear: External ear  normal.  Nose: Nose normal.       Throat erythematous,  No eduadate,  Mouth clear no lesions  Eyes: Conjunctivae and EOM are normal. Pupils are equal, round, and reactive to light.  Neck: Normal range of motion. Neck supple.  Cardiovascular: Normal rate and normal heart sounds.   Pulmonary/Chest: Effort normal.  Abdominal: Soft.  Musculoskeletal: Normal range of motion.  Neurological: She is alert.  Skin: Skin is warm.  Psychiatric: She has a normal mood and affect.    ED Course  Procedures (including critical care time)  Labs Reviewed - No data to display No results found.   No diagnosis found.    MDM  Strep negative,  I advised warm salt water gargles,  Tylenol,         Lonia Skinner Rule, [place] 05/28/11 2001

## 2011-05-28 NOTE — Telephone Encounter (Signed)
I agree with referral to ED/[facility] now.  Throat swelling is potentially serious and requires immediate evaluation to assess for airway compromise, exacerbators and, potentially urgent therapy.

## 2011-05-28 NOTE — Telephone Encounter (Signed)
Pt called with c/o pain and swelling to roof of mouth and throat. She is able to breath and swallowing  okay.   No know cause.  Onset last night. She has not taken anything for this.    I advised [facility] now for evaluation.She agrees to go.   We could not see in clinic until Wednesday.

## 2011-05-31 ENCOUNTER — Telehealth: Payer: Self-pay | Admitting: *Deleted

## 2011-05-31 NOTE — Telephone Encounter (Signed)
Pt had called and stated [facility] did not receive rx refill for Tramadol. Tramadol 50mg  rx called to [facility] on [address].

## 2011-06-01 ENCOUNTER — Encounter: Payer: Medicare Other | Admitting: Internal Medicine

## 2011-06-06 ENCOUNTER — Encounter: Payer: Self-pay | Admitting: Internal Medicine

## 2011-06-06 ENCOUNTER — Ambulatory Visit (INDEPENDENT_AMBULATORY_CARE_PROVIDER_SITE_OTHER): Payer: Medicare Other | Admitting: Internal Medicine

## 2011-06-06 VITALS — BP 96/64 | HR 63 | Temp 97.9°F | Ht 69.0 in | Wt 233.1 lb

## 2011-06-06 DIAGNOSIS — F419 Anxiety disorder, unspecified: Secondary | ICD-10-CM

## 2011-06-06 DIAGNOSIS — F329 Major depressive disorder, single episode, unspecified: Secondary | ICD-10-CM

## 2011-06-06 DIAGNOSIS — M797 Fibromyalgia: Secondary | ICD-10-CM

## 2011-06-06 DIAGNOSIS — IMO0001 Reserved for inherently not codable concepts without codable children: Secondary | ICD-10-CM | POA: Diagnosis not present

## 2011-06-06 DIAGNOSIS — F411 Generalized anxiety disorder: Secondary | ICD-10-CM

## 2011-06-06 DIAGNOSIS — F32A Depression, unspecified: Secondary | ICD-10-CM

## 2011-06-06 MED ORDER — CITALOPRAM HYDROBROMIDE 40 MG PO TABS: 40.0000 mg | ORAL_TABLET | Freq: Every day | ORAL | Status: DC

## 2011-06-06 MED ORDER — TRAMADOL HCL 50 MG PO TABS: ORAL_TABLET | ORAL | Status: DC

## 2011-06-06 MED ORDER — ETODOLAC 400 MG PO TABS: 400.0000 mg | ORAL_TABLET | Freq: Two times a day (BID) | ORAL | Status: DC

## 2011-06-06 NOTE — Progress Notes (Signed)
Patient ID: Jordan [name], female   DOB: Feb 25, 1980, 31 y.o.   MRN: [id number]  31 Y/o w with pmh of depression and fibrolyagia comes for urgent care follow up med refill Was seen in urgent care last week for viral URTI which has resolved No complaints today Complaint with medications No medications side effect Uptodate on refills See individual a/p for further details.   Physical exam  General Appearance:     Filed Vitals:   06/06/11 0916  BP: 96/64  Pulse: 63  Temp: 97.9 F (36.6 C)  TempSrc: Oral  Height: 5\' 9"  (1.753 m)  Weight: 233 lb 1.6 oz (105.733 kg)     Alert, cooperative, no distress, appears stated age  Head:    Normocephalic, without obvious abnormality, atraumatic  Eyes:    PERRL, conjunctiva/corneas clear, EOM's intact, fundi    benign, both eyes       Neck:   Supple, symmetrical, trachea midline, no adenopathy;       thyroid:  No enlargement/tenderness/nodules; no carotid   bruit or JVD  Lungs:     Clear to auscultation bilaterally, respirations unlabored  Chest wall:    No tenderness or deformity  Heart:    Regular rate and rhythm, S1 and S2 normal, no murmur, rub   or gallop  Abdomen:     Soft, non-tender, bowel sounds active all four quadrants,    no masses, no organomegaly  Extremities:   Extremities normal, atraumatic, no cyanosis or edema  Pulses:   2+ and symmetric all extremities  Skin:   Skin color, texture, turgor normal, no rashes or lesions  Neurologic:  nonfocal grossly    ROS  Constitutional: Denies fever, chills, diaphoresis, appetite change and fatigue.  Respiratory: Denies SOB, DOE, cough, chest tightness,  and wheezing.   Cardiovascular: Denies chest pain, palpitations and leg swelling.  Gastrointestinal: Denies nausea, vomiting, abdominal pain, diarrhea, constipation, blood in stool and abdominal distention.  Skin: Denies pallor, rash and wound.  Neurological: Denies dizziness, light-headedness, numbness and headaches.

## 2011-06-06 NOTE — Assessment & Plan Note (Signed)
Well controlled per patient on current dose Give one year refill

## 2011-06-06 NOTE — Assessment & Plan Note (Signed)
Refill ultram and NSAID. No seizure history. No renal failure (as of 1/12) .  Continue to follow

## 2011-06-13 NOTE — Telephone Encounter (Signed)
Called pharmacy and pt picked up Rx.

## 2011-07-24 ENCOUNTER — Ambulatory Visit (INDEPENDENT_AMBULATORY_CARE_PROVIDER_SITE_OTHER): Payer: Medicare Other | Admitting: Internal Medicine

## 2011-07-24 ENCOUNTER — Encounter: Payer: Self-pay | Admitting: Internal Medicine

## 2011-07-24 ENCOUNTER — Other Ambulatory Visit (HOSPITAL_COMMUNITY): Admission: RE | Admit: 2011-07-24 | Payer: Medicare Other | Source: Ambulatory Visit | Admitting: Internal Medicine

## 2011-07-24 ENCOUNTER — Other Ambulatory Visit (HOSPITAL_COMMUNITY)
Admission: RE | Admit: 2011-07-24 | Discharge: 2011-07-24 | Disposition: A | Discharge status: Home / Self Care | Payer: Medicare Other | Source: Ambulatory Visit | Attending: Internal Medicine | Admitting: Internal Medicine

## 2011-07-24 VITALS — BP 90/66 | HR 56 | Temp 97.7°F | Ht 69.0 in | Wt 236.3 lb

## 2011-07-24 DIAGNOSIS — Z113 Encounter for screening for infections with a predominantly sexual mode of transmission: Secondary | ICD-10-CM | POA: Insufficient documentation

## 2011-07-24 DIAGNOSIS — Z7251 High risk heterosexual behavior: Secondary | ICD-10-CM

## 2011-07-24 DIAGNOSIS — N898 Other specified noninflammatory disorders of vagina: Secondary | ICD-10-CM

## 2011-07-24 DIAGNOSIS — R350 Frequency of micturition: Secondary | ICD-10-CM | POA: Diagnosis not present

## 2011-07-24 DIAGNOSIS — N390 Urinary tract infection, site not specified: Secondary | ICD-10-CM

## 2011-07-24 MED ORDER — CIPROFLOXACIN HCL 500 MG PO TABS: 500.0000 mg | ORAL_TABLET | Freq: Two times a day (BID) | ORAL | Status: DC

## 2011-07-24 MED ORDER — METRONIDAZOLE 500 MG PO TABS: 500.0000 mg | ORAL_TABLET | Freq: Two times a day (BID) | ORAL | Status: DC

## 2011-07-24 NOTE — Assessment & Plan Note (Signed)
Patient starting having her menstrual cycle today. It is very difficult to examine the patient for the consistency and type of discharge and hence I would treat her empirically for bacterial vaginosis. I would also obtain GC chlamydia probe on urine and would call her back if any of these 2 tests come back positive. Patient was told to drink a lot of fluids and also followup in the clinic in one to 2 weeks if discharge does not go away with seven-day course of Flagyl. Patient understood and agrees with the plan.

## 2011-07-24 NOTE — Progress Notes (Signed)
  Subjective:    Patient ID: Jordan [name], female    DOB: Dec 06, 1980, 31 y.o.   MRN: [id number]  HPI  Patient is a 31 year old female with past medical history most significant for fibromyalgia who is here today for vaginal discharge. She states that she's been having vaginal discharge which is foul smelling, white thick, soaks about one pad a day for last one week. He has had similar discharge in the past and said that she had problems with bacterial vaginosis last year which was treated with Flagyl.   Patient also complains of frequent urination and lower abdominal pain which has been going on for about 2-3 days although she denies upper abdominal pain, fever, chills, nausea or vomiting.  No other complaints at this time  Review of Systems  Constitutional: Negative for fever, activity change and appetite change.  HENT: Negative for sore throat.   Respiratory: Negative for cough and shortness of breath.   Cardiovascular: Negative for chest pain and leg swelling.  Gastrointestinal: Positive for abdominal pain. Negative for nausea, diarrhea, constipation and abdominal distention.  Genitourinary: Positive for dysuria, urgency, frequency, vaginal discharge, difficulty urinating and pelvic pain. Negative for hematuria.  Neurological: Negative for dizziness and headaches.  Psychiatric/Behavioral: Negative for suicidal ideas and behavioral problems.       Objective:   Physical Exam  Constitutional: She is oriented to person, place, and time. She appears well-developed and well-nourished.  HENT:  Head: Normocephalic and atraumatic.  Eyes: Conjunctivae and EOM are normal. Pupils are equal, round, and reactive to light. No scleral icterus.  Neck: Normal range of motion. Neck supple. No JVD present. No thyromegaly present.  Cardiovascular: Normal rate, regular rhythm, normal heart sounds and intact distal pulses.  Exam reveals no gallop and no friction rub.   No murmur heard. Pulmonary/Chest:  Effort normal and breath sounds normal. No respiratory distress. She has no wheezes. She has no rales.  Abdominal: Soft. Bowel sounds are normal. She exhibits no distension and no mass. There is tenderness. There is no rebound and no guarding.       No CVA tenderness noted  Genitourinary:       Vaginal exam was not done because patient's menses started this morning  Musculoskeletal: Normal range of motion. She exhibits no edema and no tenderness.  Lymphadenopathy:    She has no cervical adenopathy.  Neurological: She is alert and oriented to person, place, and time.  Psychiatric: She has a normal mood and affect. Her behavior is normal.          Assessment & Plan:

## 2011-07-24 NOTE — Assessment & Plan Note (Signed)
I would treat patient with 3 day course of ciprofloxacin for uncomplicated urinary tract infection. I would obtain urine analysis and urine culture today and would change therapy if there is a different orgasm which is resistant to ciprofloxacin as growing from the urine.

## 2011-07-24 NOTE — Patient Instructions (Signed)

## 2011-07-25 LAB — URINALYSIS, MICROSCOPIC ONLY
Bacteria, UA: NONE SEEN
Casts: NONE SEEN
Crystals: NONE SEEN

## 2011-07-25 LAB — URINALYSIS, ROUTINE W REFLEX MICROSCOPIC
Glucose, UA: NEGATIVE mg/dL
Ketones, ur: NEGATIVE mg/dL
Nitrite: NEGATIVE
Protein, ur: 30 mg/dL — AB
Specific Gravity, Urine: 1.026 (ref 1.005–1.030)
Urobilinogen, UA: 0.2 mg/dL (ref 0.0–1.0)
pH: 6.5 (ref 5.0–8.0)

## 2011-07-25 LAB — URINE CULTURE: Colony Count: 40000

## 2011-08-03 ENCOUNTER — Emergency Department (HOSPITAL_COMMUNITY)
Admission: EM | Admit: 2011-08-03 | Discharge: 2011-08-03 | Discharge status: Against Medical Advice | Payer: Medicare Other | Attending: Emergency Medicine | Admitting: Emergency Medicine

## 2011-08-03 ENCOUNTER — Encounter (HOSPITAL_COMMUNITY): Payer: Self-pay | Admitting: *Deleted

## 2011-08-03 DIAGNOSIS — R109 Unspecified abdominal pain: Secondary | ICD-10-CM | POA: Insufficient documentation

## 2011-08-03 LAB — URINALYSIS, ROUTINE W REFLEX MICROSCOPIC
Glucose, UA: NEGATIVE mg/dL
Hgb urine dipstick: NEGATIVE
Ketones, ur: NEGATIVE mg/dL
Leukocytes, UA: NEGATIVE
Nitrite: NEGATIVE
Protein, ur: NEGATIVE mg/dL
Specific Gravity, Urine: 1.024 (ref 1.005–1.030)
Urobilinogen, UA: 1 mg/dL (ref 0.0–1.0)
pH: 6 (ref 5.0–8.0)

## 2011-08-03 LAB — POCT PREGNANCY, URINE: Preg Test, Ur: NEGATIVE

## 2011-08-03 NOTE — ED Notes (Signed)
Pt states that she was seen here a week ago for UTI syptoms. Pt states she took a full weeks worth of 2 anitbiotics ( 1 for three days and another she just finished). Pt states that she is now having left side pain, discharge. Pt denies painful urination.

## 2011-08-08 ENCOUNTER — Encounter: Payer: Self-pay | Admitting: Internal Medicine

## 2011-08-08 ENCOUNTER — Other Ambulatory Visit (HOSPITAL_COMMUNITY)
Admission: RE | Admit: 2011-08-08 | Discharge: 2011-08-08 | Disposition: A | Discharge status: Home / Self Care | Payer: Medicare Other | Source: Ambulatory Visit | Attending: Internal Medicine | Admitting: Internal Medicine

## 2011-08-08 ENCOUNTER — Ambulatory Visit (INDEPENDENT_AMBULATORY_CARE_PROVIDER_SITE_OTHER): Payer: Medicare Other | Admitting: Internal Medicine

## 2011-08-08 VITALS — BP 102/67 | HR 72 | Temp 97.6°F | Ht 69.0 in | Wt 239.1 lb

## 2011-08-08 DIAGNOSIS — R29818 Other symptoms and signs involving the nervous system: Secondary | ICD-10-CM

## 2011-08-08 DIAGNOSIS — N760 Acute vaginitis: Secondary | ICD-10-CM | POA: Insufficient documentation

## 2011-08-08 DIAGNOSIS — Z113 Encounter for screening for infections with a predominantly sexual mode of transmission: Secondary | ICD-10-CM | POA: Insufficient documentation

## 2011-08-08 DIAGNOSIS — N898 Other specified noninflammatory disorders of vagina: Secondary | ICD-10-CM | POA: Diagnosis not present

## 2011-08-08 DIAGNOSIS — R2689 Other abnormalities of gait and mobility: Secondary | ICD-10-CM | POA: Insufficient documentation

## 2011-08-08 MED ORDER — FLUCONAZOLE 150 MG PO TABS: 150.0000 mg | ORAL_TABLET | Freq: Once | ORAL | Status: AC

## 2011-08-08 MED ORDER — METRONIDAZOLE 500 MG PO TABS: 500.0000 mg | ORAL_TABLET | Freq: Two times a day (BID) | ORAL | Status: DC

## 2011-08-08 NOTE — Patient Instructions (Signed)
Drink plenty of fluids throughout the day as the weather has been really hot lately and dehydration may be one of the major causes of your problems with balance. I have prescribed you one dose of Diflucan and a seven-day course of Flagyl. Please take all his medications as prescribed. Please contact our office if you're balance problems does not go away.

## 2011-08-08 NOTE — Assessment & Plan Note (Signed)
Patient's orthostatic vitals are completely normal. This is unlikely to be secondary to posterior circulation stroke. Patient does not have any ear pain or discharge which makes labyrinthitis  less likely. I have asked the patient to monitor her balance, keep herself hydrated and if this continues to be a problem then she may need an evaluation by a physical therapist. She agrees with the plan.

## 2011-08-08 NOTE — Assessment & Plan Note (Signed)
Patient is having vaginal discharge which is thick, white and curdy. Differentials include bacterial vaginosis, vulvovaginal candidiasis and trichomoniasis. We will send the vaginal swab for GC, chlamydia and trichomonas. I will treat her empirically with diflucan single dose and metronidazole for 7 days.  S. a vaginal lesion which looks suspicious for either a viral infection vs localized inflammation. I am not very experienced in diagnosing and treating vaginal lesions and will refer the patient to women's clinic for further evaluation.

## 2011-08-08 NOTE — Progress Notes (Signed)
Subjective:    Patient ID: Jordan [name], female    DOB: 08-Aug-1980, 31 y.o.   MRN: [id number]  HPI  Jordan [name] is a 31 year old female with past medical history most significant for fibromyalgia and depression.  #Patient was seen in our clinic in first week of June for symptomatic urinary tract infection and vaginal discharge. She was treated for bacterial vaginosis and was given a three-day course of Cipro floxacillin.  #Patient does not have urgency, frequency or burning urination anymore.  #Patient is still having vaginal discharge although the amount of discharge has reduced in quantity. The discharge is still the same that is thick,  white and curdy. The discharge is associated with vaginal itching. No abdominal pain noted. Patient had her regular menses earlier this month.  #Patient also complains of difficulty with balance since last few weeks. Patient states that she feels unstable and has actually fallen a couple of times. She denies any difficulty in speech, difficulty in eating, weakness or numbness in any particular part of her body, changes in her urinary habits or bowel movements. When asked specifically about balance she sees that whenever she is walking she feels as if she is going to fall. She has had this for about 3 weeks now. Patient states that she does not feel dizzy on changing her position from sitting to standing. Her orthostatic vitals are normal in clinic today. Patient states that her one leg feels weaker than the other but also adds that it has been like that for many years.  No other complaints today.  Review of Systems  Constitutional: Negative for fever, activity change and appetite change.  HENT: Negative for sore throat.   Respiratory: Negative for cough and shortness of breath.   Cardiovascular: Negative for chest pain and leg swelling.  Gastrointestinal: Negative for nausea, abdominal pain, diarrhea, constipation and abdominal distention.  Genitourinary:  Positive for vaginal discharge and vaginal pain. Negative for frequency, hematuria and difficulty urinating.       Vaginal itching  Neurological: Negative for dizziness and headaches.       Problems with her balance  Psychiatric/Behavioral: Negative for suicidal ideas and behavioral problems.       Objective:   Physical Exam  Constitutional: She is oriented to person, place, and time. She appears well-developed and well-nourished.  HENT:  Head: Normocephalic and atraumatic.  Eyes: Conjunctivae and EOM are normal. Pupils are equal, round, and reactive to light. No scleral icterus.  Neck: Normal range of motion. Neck supple. No JVD present. No thyromegaly present.  Cardiovascular: Normal rate, regular rhythm, normal heart sounds and intact distal pulses.  Exam reveals no gallop and no friction rub.   No murmur heard. Pulmonary/Chest: Effort normal and breath sounds normal. No respiratory distress. She has no wheezes. She has no rales.  Abdominal: Soft. Bowel sounds are normal. She exhibits no distension and no mass. There is no tenderness. There is no rebound and no guarding.  Genitourinary:       A complete genitourinary exam was done. The patient did not have any lesions over the vulva. Patient had thick white curdy discharge coming out of her vagina. After inserting the speculum and locating the cervix, 3 x 3 cm lesion was seen on the right inferior lateral aspect of cervical vault, the mucosa was friable and patient started bleeding with minimal manipulation. Wet prep and vaginal swab for GC, Chlamydia and Trichomonas were taken.  Musculoskeletal: Normal range of motion. She exhibits no edema and  no tenderness.  Lymphadenopathy:    She has no cervical adenopathy.  Neurological: She is alert and oriented to person, place, and time.  Psychiatric:       The patient had with a flat affect.          Assessment & Plan:

## 2011-08-09 ENCOUNTER — Encounter: Payer: Self-pay | Admitting: Obstetrics & Gynecology

## 2011-08-10 DIAGNOSIS — M545 Low back pain, unspecified: Secondary | ICD-10-CM | POA: Diagnosis not present

## 2011-08-10 DIAGNOSIS — IMO0002 Reserved for concepts with insufficient information to code with codable children: Secondary | ICD-10-CM | POA: Diagnosis not present

## 2011-08-14 DIAGNOSIS — IMO0002 Reserved for concepts with insufficient information to code with codable children: Secondary | ICD-10-CM | POA: Diagnosis not present

## 2011-08-28 DIAGNOSIS — IMO0002 Reserved for concepts with insufficient information to code with codable children: Secondary | ICD-10-CM | POA: Diagnosis not present

## 2011-09-03 ENCOUNTER — Encounter: Payer: Medicare Other | Admitting: Obstetrics & Gynecology

## 2011-09-26 ENCOUNTER — Ambulatory Visit (INDEPENDENT_AMBULATORY_CARE_PROVIDER_SITE_OTHER): Payer: Medicare Other | Admitting: Obstetrics & Gynecology

## 2011-09-26 ENCOUNTER — Encounter: Payer: Self-pay | Admitting: Obstetrics & Gynecology

## 2011-09-26 VITALS — BP 109/69 | HR 73 | Temp 97.6°F | Ht 69.0 in | Wt 246.9 lb

## 2011-09-26 DIAGNOSIS — N898 Other specified noninflammatory disorders of vagina: Secondary | ICD-10-CM | POA: Diagnosis not present

## 2011-09-26 NOTE — Progress Notes (Signed)
Patient ID: Jordan [name], female   DOB: 07-28-1980, 31 y.o.   MRN: [id number]  Chief Complaint  Patient presents with  . Referral    vaginal lesions; sharp in lower abd    HPI Jordan [name] is a 31 y.o. female.  She is referred by her internal medicine physician, Dr.Garg due to recent treatment for vaginal yeast infection and a possible genital lesion. She says she responded well to treatment for vaginal yeast. The lesion in question is no longer present. She states that these are not recurrent. HPI  Past Medical History  Diagnosis Date  . Basal cell carcinoma     recurrent in left maxillary, has had 5 surguries, last one 2003  . Fibroma     left ovarian  . Cyst of nasal sinus   . Candidiasis, vagina     recurrent, pt has made lifestyle modifications and none recently (as of 8/12)  . Gardnerella infection     + in 08/08  . [place]     hx of herpetic requiring I+D,( was bitten by autistic child that cares fr  . Allergic rhinitis     pt takes flonase for episodes, no episodes in 2012  . PLANTAR FASCIITIS, BILATERAL 05/24/2009  . Paronychia of third finger of left hand 07/28/2010  . Fibromyalgia   . Herniated disc     Past Surgical History  Procedure Date  . Left oophorectomy   . Cholecystectomy   . Nasal sinus surgery     X 2 at age 65 & 52    Family History  Problem Relation Age of Onset  . Stomach cancer      uncle  . Breast cancer      two aunts  . Cancer      unknown grandfather  . Diabetes      grandmother, aunt and 2 uncles  . Diabetes Maternal Grandmother     Social History History  Substance Use Topics  . Smoking status: Never Smoker   . Smokeless tobacco: Not on file  . Alcohol Use: No    No Known Allergies  Current Outpatient Prescriptions  Medication Sig Dispense Refill  . citalopram (CELEXA) 40 MG tablet Take 40 mg by mouth daily.      Marland Kitchen etodolac (LODINE) 400 MG tablet Take 400 mg by mouth 2 (two) times daily.      . methocarbamol (ROBAXIN)  500 MG tablet Take 500 mg by mouth 2 (two) times daily.      . traMADol (ULTRAM) 50 MG tablet Take 50-100 mg by mouth 3 (three) times daily. 2 in the AM, 1 in the afternoon, and 2 at bedtime!      . metroNIDAZOLE (FLAGYL) 500 MG tablet Take 1 tablet (500 mg total) by mouth 2 (two) times daily with a meal.  14 tablet  0    Review of Systems Review of Systems  Genitourinary: Negative for dysuria, vaginal bleeding, vaginal discharge, difficulty urinating, genital sores and vaginal pain.    Blood pressure 109/69, pulse 73, temperature 97.6 F (36.4 C), temperature source Oral, height 5\' 9"  (1.753 m), weight 246 lb 14.4 oz (111.993 kg).  Physical Exam Physical Exam  Constitutional: No distress.       Obese  Genitourinary: Vagina normal. No vaginal discharge found.       No vaginal discharge or lesions  Skin: Skin is warm and dry.  Psychiatric: She has a normal mood and affect. Her behavior is normal.    Data  Reviewed Referral note, history  Assessment    Recent treatment for yeast vaginitis. No evidence of vaginal lesions or recurrent infection. She was given information on vaginitis. Continue to follow with her primary care physician.    Plan    Return to clinic as needed       Desta Bujak 09/26/2011, 4:11 PM

## 2011-09-26 NOTE — Patient Instructions (Signed)
Vaginitis Vaginitis is an infection. It causes soreness, swelling, and redness (inflammation) of the vagina. Many of these infections are sexually transmitted diseases (STDs). Having unprotected sex can cause further problems and complications such as:  Chronic pelvic pain.   Infertility.   Unwanted pregnancy.   Abortion.   Tubal pregnancy.   Infection passed on to the newborn.   Cancer.  CAUSES   Monilia. This is a yeast or fungus infection, not an STD.   Bacterial vaginosis. The normal balance of bacteria in the vagina is disrupted and is replaced by an overgrowth of certain bacteria.   Gonorrhea, chlamydia. These are bacterial infections that are STDs.   Vaginal sponges, diaphragms, and intrauterine devices.   Trichomoniasis. This is a STD infection caused by a parasite.   Viruses like herpes and human papillomavirus. Both are STDs.   Pregnancy.   Immunosuppression. This occurs with certain conditions such as HIV infection or cancer.   Using bubble bath.   Taking certain antibiotic medicines.   Sporadic recurrence can occur if you become sick.   Diabetes.   Steroids.   Allergic reaction. If you have an allergy to:   Douches.   Soaps.   Spermicides.   Condoms.   Scented tampons or vaginal sprays.  SYMPTOMS   Abnormal vaginal discharge.   Itching of the vagina.   Pain in the vagina.   Swelling of the vagina.  In some cases, there are no symptoms. TREATMENT  Treatment will vary depending on the type of infection.  Bacteria or trichomonas are usually treated with oral antibiotics and sometimes vaginal cream or suppositories.   Monilia vaginitis is usually treated with vaginal creams, suppositories, or oral antifungal pills.   Viral vaginitis has no cure. However, the symptoms of herpes (a viral vaginitis) can be treated to relieve the discomfort. Human papillomavirus has no symptoms. However, there are treatments for the diseases caused by human  papillomavirus.   With allergic vaginitis, you need to stop using the product that is causing the problem. Vaginal creams can be used to treat the symptoms.   When treating an STD, the sex partner should also be treated.  HOME CARE INSTRUCTIONS   Take all the medicines as directed by your caregiver.   Do not use scented tampons, soaps, or vaginal sprays.   Do not douche.   Tell your sex partner if you have a vaginal infection or an STD.   Do not have sexual intercourse until you have treated the vaginitis.   Practice safe sex by using condoms.  SEEK MEDICAL CARE IF:   You have abdominal pain.   Your symptoms get worse during treatment.  Document Released: 12/03/2006 Document Revised: 01/25/2011 Document Reviewed: 07/29/2008 [employer] Patient Information 2012 [employer]. 

## 2011-10-05 DIAGNOSIS — D164 Benign neoplasm of bones of skull and face: Secondary | ICD-10-CM | POA: Diagnosis not present

## 2011-10-05 DIAGNOSIS — D165 Benign neoplasm of lower jaw bone: Secondary | ICD-10-CM | POA: Diagnosis not present

## 2011-12-10 ENCOUNTER — Encounter: Payer: Medicare Other | Admitting: Internal Medicine

## 2011-12-19 ENCOUNTER — Ambulatory Visit (INDEPENDENT_AMBULATORY_CARE_PROVIDER_SITE_OTHER): Payer: Medicare Other | Admitting: Internal Medicine

## 2011-12-19 ENCOUNTER — Telehealth: Payer: Self-pay | Admitting: *Deleted

## 2011-12-19 ENCOUNTER — Encounter: Payer: Self-pay | Admitting: Internal Medicine

## 2011-12-19 VITALS — BP 93/64 | HR 66 | Temp 98.4°F | Ht 69.0 in | Wt 252.2 lb

## 2011-12-19 DIAGNOSIS — M722 Plantar fascial fibromatosis: Secondary | ICD-10-CM | POA: Diagnosis not present

## 2011-12-19 DIAGNOSIS — Z23 Encounter for immunization: Secondary | ICD-10-CM | POA: Diagnosis not present

## 2011-12-19 DIAGNOSIS — Z124 Encounter for screening for malignant neoplasm of cervix: Secondary | ICD-10-CM | POA: Insufficient documentation

## 2011-12-19 DIAGNOSIS — M779 Enthesopathy, unspecified: Secondary | ICD-10-CM | POA: Diagnosis not present

## 2011-12-19 DIAGNOSIS — Z0000 Encounter for general adult medical examination without abnormal findings: Secondary | ICD-10-CM | POA: Diagnosis not present

## 2011-12-19 DIAGNOSIS — N898 Other specified noninflammatory disorders of vagina: Secondary | ICD-10-CM | POA: Diagnosis not present

## 2011-12-19 DIAGNOSIS — IMO0001 Reserved for inherently not codable concepts without codable children: Secondary | ICD-10-CM | POA: Diagnosis not present

## 2011-12-19 DIAGNOSIS — M797 Fibromyalgia: Secondary | ICD-10-CM

## 2011-12-19 MED ORDER — CITALOPRAM HYDROBROMIDE 40 MG PO TABS: 40.0000 mg | ORAL_TABLET | Freq: Every day | ORAL | Status: DC

## 2011-12-19 MED ORDER — METRONIDAZOLE 500 MG PO TABS: 500.0000 mg | ORAL_TABLET | Freq: Two times a day (BID) | ORAL | Status: DC

## 2011-12-19 MED ORDER — PREGABALIN 150 MG PO CAPS: 150.0000 mg | ORAL_CAPSULE | Freq: Two times a day (BID) | ORAL | Status: DC

## 2011-12-19 NOTE — Progress Notes (Signed)
  Subjective:    Patient ID: Jordan [name], female    DOB: Jan 22, 1981, 31 y.o.   MRN: [id number]  HPI  Ms. Jolly presents to clinic with c/o watery vaginal discharge which she states is like her prior bacterial vaginosis.  She states that she has begun "working out" as much as her fibromyalgia will allow. She is requesting refill of Flagyl and medication for her fibromyalgia.  Review of Systems  Constitutional: Negative for fatigue.  Respiratory: Negative for shortness of breath.   Cardiovascular: Negative for chest pain.  Genitourinary: Positive for vaginal discharge. Negative for dysuria, urgency, hematuria, flank pain, vaginal bleeding, vaginal pain and pelvic pain.  Musculoskeletal: Positive for myalgias and arthralgias.  Neurological: Negative for headaches.  Psychiatric/Behavioral: Negative for dysphoric mood.       Objective:   Physical Exam  Constitutional: She is oriented to person, place, and time. She appears well-developed and well-nourished. No distress.  HENT:  Head: Normocephalic and atraumatic.  Eyes: Conjunctivae normal and EOM are normal. Pupils are equal, round, and reactive to light.  Neck: Normal range of motion. Neck supple.  Cardiovascular: Normal rate, regular rhythm, normal heart sounds and intact distal pulses.   Pulmonary/Chest: Effort normal and breath sounds normal.  Abdominal: Soft. Bowel sounds are normal. There is no tenderness.  Musculoskeletal: Normal range of motion. She exhibits tenderness. She exhibits no edema.  Neurological: She is alert and oriented to person, place, and time.  Skin: Skin is warm and dry.  Psychiatric: She has a normal mood and affect. Her behavior is normal. Judgment and thought content normal.          Assessment & Plan:  1. Bacterial Vaginosis: likely, denies sexual partners last BV episode may 2013, previously tx with Metronidazole, eval by GYN --Metronidazole 500 mg bid --counseled on hygiene after exercise and  cotton undergarments  2. Fibromyalgia: will start Lyrica 150 mg bid --f/u 2-3 weeks for likely titration upwards of Lyrica   3. Depression: cont Celexa

## 2011-12-19 NOTE — Telephone Encounter (Signed)
Message copied by Hassan Buckler on Wed Dec 19, 2011 11:01 AM ------      Message from: [name] Schwartz      Created: Wed Dec 19, 2011 10:45 AM       Please contact patient and confirm that she received my message that I left on mobile voice mail to continue Celexa 40 mg for her mood and start the Lyrica 150 mg bid for the fibromyalgia.  We can re-assess the need to discontinue Celexa at her follow-up appointment in 2-3 weeks.                  Kristie Cowman, MD

## 2011-12-19 NOTE — Telephone Encounter (Signed)
Talked to pt; confirmed w/pt to continued taking Celexa and Lyrica per Dr Bosie Clos; pt agreed.

## 2011-12-19 NOTE — Assessment & Plan Note (Signed)
Flu shot today 

## 2011-12-19 NOTE — Assessment & Plan Note (Signed)
Reports return of clear discharge since increasing work-out routine, no sexual partners, no odor -refuses Pelvic exam today "same thing as usual" -tx emprirically with Metronidazole 7 day oral course, educated on proper hygeine and undergarmemts after exercising

## 2011-12-19 NOTE — Patient Instructions (Addendum)
You received the flu shot today. You declined a pelvic exam.  I will prescribe Flagyl for your vaginal discharge. If the discharge changes by becoming worse, develops a strong smell, vaginal bleeding or pelvic discomfort, please return for a pelvic exam. We will continue the Celexa for your mood and start Lyrica for your fibromyalgia. Return in 2-3 weeks to re-evaluate the effectiveness of Lyrica.

## 2012-01-07 ENCOUNTER — Ambulatory Visit (INDEPENDENT_AMBULATORY_CARE_PROVIDER_SITE_OTHER): Payer: Medicare Other | Admitting: Internal Medicine

## 2012-01-07 ENCOUNTER — Encounter: Payer: Self-pay | Admitting: Internal Medicine

## 2012-01-07 VITALS — BP 99/66 | HR 71 | Temp 98.6°F | Ht 69.0 in | Wt 258.7 lb

## 2012-01-07 DIAGNOSIS — B354 Tinea corporis: Secondary | ICD-10-CM | POA: Diagnosis not present

## 2012-01-07 DIAGNOSIS — G4700 Insomnia, unspecified: Secondary | ICD-10-CM

## 2012-01-07 DIAGNOSIS — IMO0001 Reserved for inherently not codable concepts without codable children: Secondary | ICD-10-CM | POA: Diagnosis not present

## 2012-01-07 DIAGNOSIS — F32A Depression, unspecified: Secondary | ICD-10-CM

## 2012-01-07 DIAGNOSIS — M797 Fibromyalgia: Secondary | ICD-10-CM

## 2012-01-07 DIAGNOSIS — F329 Major depressive disorder, single episode, unspecified: Secondary | ICD-10-CM

## 2012-01-07 NOTE — Assessment & Plan Note (Signed)
Improved with Lyrica 150mg  bid.  Continue to monitor symptoms with up-titration as needed.

## 2012-01-07 NOTE — Assessment & Plan Note (Signed)
Stable on Celexa 40 mg qd 

## 2012-01-07 NOTE — Progress Notes (Signed)
  Subjective:    Patient ID: Jordan [name], female    DOB: [birth year], 31 y.o.   MRN: [id number]  HPI Pt with hx significant for depression, insomnia andfFibromyalgia.  Returns for a checkup since initiating Lyrica therapy 150 mg bid.  Pt states that Lyrica has "worke great" for her pain and has improved her insomnia such that she can now have a good nights sleep.  Also states that she developed "a spot" on her ear over the past week that is somewhat itchy and she thinks is ringworm.  Of note, she works with teenagers.   Review of Systems  Constitutional: Negative for fever and appetite change.  Eyes: Negative for visual disturbance.  Cardiovascular: Negative for chest pain.  Gastrointestinal: Negative for abdominal pain.  Genitourinary: Negative for vaginal discharge.  Skin: Positive for rash.       Left ear lobe rash  Neurological: Negative for weakness and headaches.  Psychiatric/Behavioral: Negative for sleep disturbance.       Objective:   Physical Exam  Constitutional: She is oriented to person, place, and time. She appears well-developed and well-nourished. No distress.  HENT:  Head: Normocephalic and atraumatic.    Cardiovascular: Normal rate, regular rhythm and normal heart sounds.   Pulmonary/Chest: Effort normal and breath sounds normal.  Abdominal: Soft. Bowel sounds are normal.  Neurological: She is alert and oriented to person, place, and time.  Skin: Skin is warm and dry.  Psychiatric: She has a normal mood and affect. Her behavior is normal. Judgment and thought content normal.          Assessment & Plan:  1. Fibromyalgia: good control on Lyrica 150 mg bid 2. Depression: stable on Celexa 40 mg qd 3. Tinea versicolor, likely: will treat empirically with ketoconazole cream 4. Vaginal discharge likely Bacterial Vaginosis: resolved after Metronidazole therapy

## 2012-01-07 NOTE — Assessment & Plan Note (Signed)
Treat with ketoconazole cream.  Pt advised if no improvement in symptoms in 2 weeks to be re-evaluated.  Would perform skin scraping at that time if indicated.

## 2012-01-07 NOTE — Assessment & Plan Note (Signed)
Improved with somnalence side effects of Lyrica.

## 2012-01-28 ENCOUNTER — Other Ambulatory Visit: Payer: Self-pay | Admitting: *Deleted

## 2012-01-29 MED ORDER — TRAMADOL HCL 50 MG PO TABS: 50.0000 mg | ORAL_TABLET | Freq: Three times a day (TID) | ORAL | Status: DC

## 2012-02-07 ENCOUNTER — Ambulatory Visit (INDEPENDENT_AMBULATORY_CARE_PROVIDER_SITE_OTHER): Payer: Medicare Other | Admitting: Internal Medicine

## 2012-02-07 ENCOUNTER — Encounter: Payer: Self-pay | Admitting: Internal Medicine

## 2012-02-07 VITALS — BP 90/61 | HR 61 | Temp 98.6°F | Ht 69.0 in | Wt 261.4 lb

## 2012-02-07 DIAGNOSIS — B373 Candidiasis of vulva and vagina: Secondary | ICD-10-CM | POA: Diagnosis not present

## 2012-02-07 DIAGNOSIS — B3731 Acute candidiasis of vulva and vagina: Secondary | ICD-10-CM | POA: Insufficient documentation

## 2012-02-07 DIAGNOSIS — E669 Obesity, unspecified: Secondary | ICD-10-CM

## 2012-02-07 MED ORDER — FLUCONAZOLE 150 MG PO TABS: 150.0000 mg | ORAL_TABLET | Freq: Once | ORAL | Status: DC

## 2012-02-07 NOTE — Progress Notes (Signed)
  Subjective:    Patient ID: Jordan [name], female    DOB: May 28, 1980, 31 y.o.   MRN: [id number]  HPI  Pt presents with c/o white vaginal discharge since taking Metronidazole for bacterial vaginosis.  States that this happens when she takes metronidazole and was told to eat yogurt while on therapy but she forgot.  No c/o pelvic pain, dysuria, vaginal odor or bleeding. Hx significant for fibromyalgia controlled on Lyrica, depression/anxiety, and chronic back pain.  Also states that she feels very "out-of shape".  Used to walk 2 mile several times a week but had stopped when fibromyalgia flared.  Review of Systems  Constitutional: Negative for fatigue.  Respiratory: Negative for shortness of breath.   Genitourinary: Positive for vaginal discharge. Negative for dysuria, vaginal bleeding, vaginal pain and pelvic pain.  Musculoskeletal: Negative.   Neurological: Negative for weakness and headaches.  Psychiatric/Behavioral: Negative for dysphoric mood.   As per HPI otherwise negative    Objective:   Physical Exam  Constitutional: She is oriented to person, place, and time. She appears well-developed and well-nourished. No distress.  HENT:  Head: Normocephalic and atraumatic.  Right Ear: External ear normal.  Left Ear: External ear normal.       No evidence of tinea on left ear that was treated with ketoconazole  Neck: Normal range of motion. Neck supple. No thyromegaly present.  Cardiovascular: Normal rate, regular rhythm and normal heart sounds.   Pulmonary/Chest: Effort normal and breath sounds normal. She has no wheezes. She has no rales.  Abdominal: Soft. Bowel sounds are normal. There is no tenderness.  Genitourinary:       Deferred per pt request  Musculoskeletal: Normal range of motion. She exhibits no edema.  Neurological: She is alert and oriented to person, place, and time.  Skin: Skin is warm and dry.  Psychiatric: She has a normal mood and affect. Her behavior is normal.  Judgment and thought content normal.          Assessment & Plan:  1. Vulvovaginal candidiasis, likely: secondary to antibiotic therapy -RX diflucan 150 mg x1  2. Obesity: BMI 38.6 with physical deconditioning -encourage exercise program with increase in daily walking

## 2012-02-07 NOTE — Assessment & Plan Note (Signed)
Encourage walking program with goal 2 miles three x week

## 2012-02-07 NOTE — Assessment & Plan Note (Signed)
RX diflucan 150 mg x1

## 2012-02-07 NOTE — Patient Instructions (Signed)
It is important for you to start walking again.  Build up your endurance by walking more and more every day.Your goal should be to get up to two (2) miles three days a week. This will help to get your weigh down as well. Follow-up with me in 6-9 months.

## 2012-02-22 ENCOUNTER — Telehealth: Payer: Self-pay | Admitting: *Deleted

## 2012-02-22 NOTE — Telephone Encounter (Signed)
Pt calls and states she needs note for school stating she was seen in office 02/07/2012. Pt will pick up the note, call [phone], would like to pick up Monday 12/6

## 2012-02-26 ENCOUNTER — Telehealth: Payer: Self-pay | Admitting: *Deleted

## 2012-02-26 ENCOUNTER — Encounter: Payer: Self-pay | Admitting: Internal Medicine

## 2012-02-26 ENCOUNTER — Ambulatory Visit (INDEPENDENT_AMBULATORY_CARE_PROVIDER_SITE_OTHER): Payer: Medicare Other | Admitting: Internal Medicine

## 2012-02-26 VITALS — BP 109/69 | HR 79 | Temp 98.5°F | Ht 69.0 in | Wt 265.9 lb

## 2012-02-26 DIAGNOSIS — L02429 Furuncle of limb, unspecified: Secondary | ICD-10-CM

## 2012-02-26 NOTE — Telephone Encounter (Signed)
Pt called with c/o bump on leg, with drainage coming out. Onset of pain 3 days ago. Has treated with Neosporin and band aid. Lesion is 1/2 dollar size. Red, swollen,  Denies fever.   Will see today for evaluation

## 2012-02-26 NOTE — Assessment & Plan Note (Addendum)
Patient's bump on her right thigh is most likely a small boil. It has already ruptured with some yellow-colored pus coming out from the top of the boil. Currently patient does not have fever or chills, indicating no sign of sepsis or systemic toxic condition. It is most likely caused by skin flora infection. I don't think it is necessary to get culture at this moment or systemic antibiotics treatment given no history for diabetes or immunodeficiency.  -will treat with topical bacitracin for 7 days. Free bacitracin ointment were provided. Cat No: NDC [id number] -Patient is instructed to come back to the hospital, if she develops fever or chills, or worsening symptoms.

## 2012-02-26 NOTE — Patient Instructions (Signed)
1. Please change Bacitracin patch every day from now. If you develop fever and chills, or symptoms get worse, please come back to hospital immediately. 2. Please take all medications as prescribed.  3. If you have worsening of your symptoms or new symptoms arise, please call the clinic ([phone]), or go to the ER immediately if symptoms are severe.    Abscess An abscess is an infected area that contains a collection of pus and debris.It can occur in almost any part of the body. An abscess is also known as a furuncle or boil. CAUSES  An abscess occurs when tissue gets infected. This can occur from blockage of oil or sweat glands, infection of hair follicles, or a minor injury to the skin. As the body tries to fight the infection, pus collects in the area and creates pressure under the skin. This pressure causes pain. People with weakened immune systems have difficulty fighting infections and get certain abscesses more often.  SYMPTOMS Usually an abscess develops on the skin and becomes a painful mass that is red, warm, and tender. If the abscess forms under the skin, you may feel a moveable soft area under the skin. Some abscesses break open (rupture) on their own, but most will continue to get worse without care. The infection can spread deeper into the body and eventually into the bloodstream, causing you to feel ill.  DIAGNOSIS  Your caregiver will take your medical history and perform a physical exam. A sample of fluid may also be taken from the abscess to determine what is causing your infection. TREATMENT  Your caregiver may prescribe antibiotic medicines to fight the infection. However, taking antibiotics alone usually does not cure an abscess. Your caregiver may need to make a small cut (incision) in the abscess to drain the pus. In some cases, gauze is packed into the abscess to reduce pain and to continue draining the area. HOME CARE INSTRUCTIONS   Only take over-the-counter or  prescription medicines for pain, discomfort, or fever as directed by your caregiver.  If you were prescribed antibiotics, take them as directed. Finish them even if you start to feel better.  If gauze is used, follow your caregiver's directions for changing the gauze.  To avoid spreading the infection:  Keep your draining abscess covered with a bandage.  Wash your hands well.  Do not share personal care items, towels, or whirlpools with others.  Avoid skin contact with others.  Keep your skin and clothes clean around the abscess.  Keep all follow-up appointments as directed by your caregiver. SEEK MEDICAL CARE IF:   You have increased pain, swelling, redness, fluid drainage, or bleeding.  You have muscle aches, chills, or a general ill feeling.  You have a fever. MAKE SURE YOU:   Understand these instructions.  Will watch your condition.  Will get help right away if you are not doing well or get worse. Document Released: 11/15/2004 Document Revised: 08/07/2011 Document Reviewed: 04/20/2011 [facility] Patient Information 2013 [place], [place].

## 2012-02-26 NOTE — Progress Notes (Signed)
Patient ID: Jordan [name], female   DOB: 24-Jan-1981, 32 y.o.   MRN: [id number]  Subjective:   Patient ID: Jordan [name] female   DOB: 02-15-1981 32 y.o.   MRN: [id number]  HPI: [name] is a 32 y.o. with past medical history as outlined below, who presents for an acute visit.  Patient noticed a small bump over her right upper thigh three days ago. It was red in color, elevated from skin and painful at the beginning. It doesn't look like a blister and is not itchy. It increased in size. Today it ruptured by itself and some yellow-colored pus coming out from the bump. The pain improved after its rupture. Patient does not have a fever or chills. She does not have a history of diabetes or immunodeficiency. Patient has history of basal cell cancer on her face, but did not receive any chemotherapy.  Denies fever, chills, fatigue, headaches,  cough, chest pain, SOB,  abdominal pain,diarrhea, constipation,joint pain or leg swelling.   Past Medical History  Diagnosis Date  . Basal cell carcinoma     recurrent in left maxillary, has had 5 surguries, last one 2003  . Fibroma     left ovarian  . Cyst of nasal sinus   . Candidiasis, vagina     recurrent, pt has made lifestyle modifications and none recently (as of 8/12)  . Gardnerella infection     + in 08/08  . [place]     hx of herpetic requiring I+D,( was bitten by autistic child that cares fr  . Allergic rhinitis     pt takes flonase for episodes, no episodes in 2012  . PLANTAR FASCIITIS, BILATERAL 05/24/2009  . Paronychia of third finger of left hand 07/28/2010  . Fibromyalgia   . Herniated disc    Current Outpatient Prescriptions  Medication Sig Dispense Refill  . citalopram (CELEXA) 40 MG tablet Take 1 tablet (40 mg total) by mouth daily.  30 tablet  6  . etodolac (LODINE) 400 MG tablet Take 400 mg by mouth 2 (two) times daily.      . fluconazole (DIFLUCAN) 150 MG tablet Take 1 tablet (150 mg total) by mouth once.  1 tablet  0    . methocarbamol (ROBAXIN) 500 MG tablet Take 500 mg by mouth 2 (two) times daily.      . pregabalin (LYRICA) 150 MG capsule Take 1 capsule (150 mg total) by mouth 2 (two) times daily.  60 capsule  2  . pregabalin (LYRICA) 150 MG capsule Take 1 capsule (150 mg total) by mouth 2 (two) times daily.  60 capsule  2  . traMADol (ULTRAM) 50 MG tablet Take 1-2 tablets (50-100 mg total) by mouth 3 (three) times daily. 2 in the AM, 1 in the afternoon, and 2 at bedtime!  30 tablet  3   Family History  Problem Relation Age of Onset  . Stomach cancer      uncle  . Breast cancer      two aunts  . Cancer      unknown grandfather  . Diabetes      grandmother, aunt and 2 uncles  . Diabetes Maternal Grandmother    History   Social History  . Marital Status: Single    Spouse Name: N/A    Number of Children: N/A  . Years of Education: N/A   Occupational History  . unemployed   . disability    Social History Main Topics  . Smoking status:  Never Smoker   . Smokeless tobacco: None  . Alcohol Use: No  . Drug Use: No  . Sexually Active: None   Other Topics Concern  . None   Social History Narrative   Lives in [place] in house with partner   Review of Systems: As per HPI.   Objective:  Physical Exam: Filed Vitals:   02/26/12 1516  BP: 109/69  Pulse: 79  Temp: 98.5 F (36.9 C)  TempSrc: Oral  Height: 5\' 9"  (1.753 m)  Weight: 265 lb 14.4 oz (120.611 kg)  SpO2: 98%   General: not in acute distress HEENT: PERRL, EOMI, no scleral icterus Cardiac: S1/S2, RRR, No murmurs, gallops or rubs Pulm: Good air movement bilaterally, Clear to auscultation bilaterally, No rales, wheezing, rhonchi or rubs. Abd: Soft,  nondistended, nontender, no rebound pain, no organomegaly, BS present Ext: No rashes or edema, 2+DP/PT pulse bilaterally Musculoskeletal: No joint deformities, erythema, or stiffness, ROM full and no nontender Skin: there is a small boil over the right front upper thigh. Size  is approximately 2 mm in diameter. It is surrounded by red colored induration, approximately 0.8 cm in size. It is tender to palpation. There is some yellow colored pus coming out from the boil top. Neuro: alert and oriented X3, cranial nerves II-XII grossly intact, muscle strength 5/5 in all extremeties,  sensation to light touch intact.  Psych.: patient is not psychotic, no suicidal or hemocidal ideation.   Assessment & Plan:

## 2012-02-27 ENCOUNTER — Other Ambulatory Visit: Payer: Self-pay | Admitting: Internal Medicine

## 2012-02-28 DIAGNOSIS — B351 Tinea unguium: Secondary | ICD-10-CM | POA: Diagnosis not present

## 2012-02-28 DIAGNOSIS — L600 Ingrowing nail: Secondary | ICD-10-CM | POA: Diagnosis not present

## 2012-03-05 ENCOUNTER — Other Ambulatory Visit: Payer: Self-pay | Admitting: Internal Medicine

## 2012-03-05 ENCOUNTER — Telehealth: Payer: Self-pay | Admitting: *Deleted

## 2012-03-05 DIAGNOSIS — L0292 Furuncle, unspecified: Secondary | ICD-10-CM

## 2012-03-05 MED ORDER — DOXYCYCLINE HYCLATE 100 MG PO TABS: 100.0000 mg | ORAL_TABLET | Freq: Two times a day (BID) | ORAL | Status: DC

## 2012-03-05 NOTE — Telephone Encounter (Signed)
I sent the doxycycline prescription to her pharmacy. Patient should take doxycycline 100 mg twice a day for 7 days.

## 2012-03-05 NOTE — Telephone Encounter (Signed)
Pt called to report she has 2 more boils on her leg.  She was seen in clinic on 1/7 for boil on thigh. She would like to start on an antibiotic if possible. [facility].  Pt 3 [phone]

## 2012-03-05 NOTE — Telephone Encounter (Signed)
Pt informed

## 2012-03-20 ENCOUNTER — Other Ambulatory Visit: Payer: Self-pay | Admitting: *Deleted

## 2012-03-20 DIAGNOSIS — M797 Fibromyalgia: Secondary | ICD-10-CM

## 2012-03-21 ENCOUNTER — Other Ambulatory Visit: Payer: Self-pay | Admitting: Internal Medicine

## 2012-03-21 DIAGNOSIS — M797 Fibromyalgia: Secondary | ICD-10-CM

## 2012-03-21 MED ORDER — PREGABALIN 150 MG PO CAPS: 150.0000 mg | ORAL_CAPSULE | Freq: Two times a day (BID) | ORAL | Status: DC

## 2012-03-21 NOTE — Telephone Encounter (Signed)
Rx called in 

## 2012-03-21 NOTE — Telephone Encounter (Signed)
No response from Dr Bosie Clos, can you refill before the weekend?

## 2012-03-30 ENCOUNTER — Encounter (HOSPITAL_COMMUNITY): Payer: Self-pay | Admitting: *Deleted

## 2012-03-30 ENCOUNTER — Emergency Department (HOSPITAL_COMMUNITY)
Admission: EM | Admit: 2012-03-30 | Discharge: 2012-03-30 | Disposition: A | Discharge status: Home / Self Care | Payer: Medicare Other | Attending: Emergency Medicine | Admitting: Emergency Medicine

## 2012-03-30 DIAGNOSIS — Z8739 Personal history of other diseases of the musculoskeletal system and connective tissue: Secondary | ICD-10-CM | POA: Insufficient documentation

## 2012-03-30 DIAGNOSIS — Z8619 Personal history of other infectious and parasitic diseases: Secondary | ICD-10-CM | POA: Insufficient documentation

## 2012-03-30 DIAGNOSIS — Z8742 Personal history of other diseases of the female genital tract: Secondary | ICD-10-CM | POA: Insufficient documentation

## 2012-03-30 DIAGNOSIS — Z85828 Personal history of other malignant neoplasm of skin: Secondary | ICD-10-CM | POA: Insufficient documentation

## 2012-03-30 DIAGNOSIS — Z79899 Other long term (current) drug therapy: Secondary | ICD-10-CM | POA: Insufficient documentation

## 2012-03-30 DIAGNOSIS — Z872 Personal history of diseases of the skin and subcutaneous tissue: Secondary | ICD-10-CM | POA: Diagnosis not present

## 2012-03-30 DIAGNOSIS — Z8709 Personal history of other diseases of the respiratory system: Secondary | ICD-10-CM | POA: Diagnosis not present

## 2012-03-30 DIAGNOSIS — Z792 Long term (current) use of antibiotics: Secondary | ICD-10-CM | POA: Insufficient documentation

## 2012-03-30 DIAGNOSIS — L0291 Cutaneous abscess, unspecified: Secondary | ICD-10-CM

## 2012-03-30 DIAGNOSIS — L02419 Cutaneous abscess of limb, unspecified: Secondary | ICD-10-CM | POA: Diagnosis not present

## 2012-03-30 DIAGNOSIS — S90569A Insect bite (nonvenomous), unspecified ankle, initial encounter: Secondary | ICD-10-CM | POA: Diagnosis not present

## 2012-03-30 DIAGNOSIS — L03119 Cellulitis of unspecified part of limb: Secondary | ICD-10-CM | POA: Diagnosis not present

## 2012-03-30 DIAGNOSIS — W57XXXA Bitten or stung by nonvenomous insect and other nonvenomous arthropods, initial encounter: Secondary | ICD-10-CM | POA: Diagnosis not present

## 2012-03-30 MED ORDER — DOXYCYCLINE HYCLATE 100 MG PO TABS: 100.0000 mg | ORAL_TABLET | Freq: Two times a day (BID) | ORAL | Status: DC

## 2012-03-30 MED ORDER — DOXYCYCLINE HYCLATE 100 MG PO TABS
100.0000 mg | ORAL_TABLET | Freq: Once | ORAL | Status: AC
  Administered 2012-03-30: 100 mg via ORAL
  Filled 2012-03-30: qty 1

## 2012-03-30 NOTE — ED Provider Notes (Signed)
History     CSN: [id number]  Arrival date & time 03/30/12  2046   First MD Initiated Contact with Patient 03/30/12 2105      Chief Complaint  Patient presents with  . Leg Swelling    (Consider location/radiation/quality/duration/timing/severity/associated sxs/prior treatment) HPI Comments: [name] is a 32 year old female with a history of familial basal cell carcinoma, who has not had an outbreak since the age of 56.  She is checked every 6 months at [facility] for any lesions.  Presents today with a lesion on her lateral right leg, which she, says has been present for approximately 2 weeks.  She noticed yesterday in the shower that they "whitehead" came off.  She covered this with a Band-Aid and now.  She has significant swelling and pain to the area.  She denies any fever or chills.  The history is provided by the patient.    Past Medical History  Diagnosis Date  . Basal cell carcinoma     recurrent in left maxillary, has had 5 surguries, last one 2003  . Fibroma     left ovarian  . Cyst of nasal sinus   . Candidiasis, vagina     recurrent, pt has made lifestyle modifications and none recently (as of 8/12)  . Gardnerella infection     + in 08/08  . [place]     hx of herpetic requiring I+D,( was bitten by autistic child that cares fr  . Allergic rhinitis     pt takes flonase for episodes, no episodes in 2012  . PLANTAR FASCIITIS, BILATERAL 05/24/2009  . Paronychia of third finger of left hand 07/28/2010  . Fibromyalgia   . Herniated disc     Past Surgical History  Procedure Laterality Date  . Left oophorectomy    . Cholecystectomy    . Nasal sinus surgery      X 2 at age 36 & 33    Family History  Problem Relation Age of Onset  . Stomach cancer      uncle  . Breast cancer      two aunts  . Cancer      unknown grandfather  . Diabetes      grandmother, aunt and 2 uncles  . Diabetes Maternal Grandmother     History  Substance Use Topics  . Smoking status: Never  Smoker   . Smokeless tobacco: Not on file  . Alcohol Use: No    OB History   Grav Para Term Preterm Abortions TAB SAB Ect Mult Living                  Review of Systems  Constitutional: Negative for fever and chills.  HENT: Negative.   Eyes: Negative.   Respiratory: Negative for cough and shortness of breath.   Cardiovascular: Positive for leg swelling. Negative for chest pain.  Gastrointestinal: Negative for nausea.  Endocrine: Negative.   Genitourinary: Negative.   Musculoskeletal: Negative for gait problem.  Skin: Positive for color change.  Allergic/Immunologic: Negative for immunocompromised state.  Neurological: Negative for dizziness and weakness.  All other systems reviewed and are negative.    Allergies  Review of patient's allergies indicates no known allergies.  Home Medications   Current Outpatient Rx  Name  Route  Sig  Dispense  Refill  . citalopram (CELEXA) 40 MG tablet   Oral   Take 1 tablet (40 mg total) by mouth daily.   30 tablet   6   . doxycycline (VIBRA-TABS)  100 MG tablet   Oral   Take 1 tablet (100 mg total) by mouth 2 (two) times daily.   14 tablet   0   . fluconazole (DIFLUCAN) 150 MG tablet   Oral   Take 1 tablet (150 mg total) by mouth once.   1 tablet   0   . methocarbamol (ROBAXIN) 500 MG tablet   Oral   Take 500 mg by mouth 2 (two) times daily.         . pregabalin (LYRICA) 150 MG capsule   Oral   Take 1 capsule (150 mg total) by mouth 2 (two) times daily.   60 capsule   5   . traMADol (ULTRAM) 50 MG tablet      TAKE TWO TABLETS BY MOUTH IN THE MORNING, THEN TAKE ONE TABLET BY MOUTH IN THE AFTERNOON, THEN TAKE TWO TABLETS BY MOUTH IN THE EVENING   160 tablet   0   . doxycycline (VIBRA-TABS) 100 MG tablet   Oral   Take 1 tablet (100 mg total) by mouth 2 (two) times daily.   13 tablet   0     BP 112/74  Pulse 69  Temp(Src) 98.7 F (37.1 C) (Oral)  SpO2 99%  Physical Exam  Constitutional: She is oriented  to person, place, and time. She appears well-developed and well-nourished.  HENT:  Head: Normocephalic.  Neck: Normal range of motion.  Cardiovascular: Normal rate and regular rhythm.   Pulmonary/Chest: Effort normal and breath sounds normal.  Musculoskeletal: Normal range of motion. She exhibits edema and tenderness.  Approximately 10 cm x 3 cm area of erythema, tenderness, and slight swelling to the lateral lower aspect of the right leg.  Not involving the ankle or knee joint  Neurological: She is alert and oriented to person, place, and time.  Skin: Skin is warm. Lesion noted. There is erythema.  2 cm round fluctuant area with a punctate central lesion, without drainage, with surrounding erythema.  For total area of 10 cm x 2 cm on the lateral aspect of the right lower leg.  Not involving the ankle or knee joint    ED Course  INCISION AND DRAINAGE Date/Time: 03/30/2012 9:40 PM Performed by: Arman Filter Authorized by: Arman Filter Consent: Verbal consent obtained. Risks and benefits: risks, benefits and alternatives were discussed Consent given by: patient Patient understanding: patient states understanding of the procedure being performed Patient identity confirmed: verbally with patient Time out: Immediately prior to procedure a "time out" was called to verify the correct patient, procedure, equipment, support staff and site/side marked as required. Type: abscess Body area: lower extremity Location details: right leg Anesthesia: local infiltration Local anesthetic: lidocaine 1% without epinephrine Anesthetic total: 1 ml Patient sedated: no Scalpel size: 11 Needle gauge: 22 Incision type: single straight Complexity: simple Drainage: purulent Drainage amount: moderate Wound treatment: wound left open Packing material: none Patient tolerance: Patient tolerated the procedure well with no immediate complications.   (including critical care time)  Labs Reviewed - No data  to display No results found.   1. Abscess       MDM   Small abscess was drained for a moderate amount of purulent drainage site was left open.  I will restart doxycycline 100 mg twice a day for one week        Arman Filter, NP 03/30/12 2146

## 2012-03-30 NOTE — ED Notes (Signed)
PT reports a bump was seen on RT lower leg. The bump opened up on SAT 2-8 . Pt reports the lower leg started to swell today.

## 2012-03-31 ENCOUNTER — Emergency Department (HOSPITAL_COMMUNITY)
Admission: EM | Admit: 2012-03-31 | Discharge: 2012-03-31 | Disposition: A | Discharge status: Home / Self Care | Payer: Medicare Other | Attending: Emergency Medicine | Admitting: Emergency Medicine

## 2012-03-31 ENCOUNTER — Encounter (HOSPITAL_COMMUNITY): Payer: Self-pay | Admitting: Emergency Medicine

## 2012-03-31 DIAGNOSIS — L03119 Cellulitis of unspecified part of limb: Secondary | ICD-10-CM | POA: Insufficient documentation

## 2012-03-31 DIAGNOSIS — Z8742 Personal history of other diseases of the female genital tract: Secondary | ICD-10-CM | POA: Insufficient documentation

## 2012-03-31 DIAGNOSIS — Z8619 Personal history of other infectious and parasitic diseases: Secondary | ICD-10-CM | POA: Diagnosis not present

## 2012-03-31 DIAGNOSIS — Z79899 Other long term (current) drug therapy: Secondary | ICD-10-CM | POA: Insufficient documentation

## 2012-03-31 DIAGNOSIS — S90569A Insect bite (nonvenomous), unspecified ankle, initial encounter: Secondary | ICD-10-CM | POA: Diagnosis not present

## 2012-03-31 DIAGNOSIS — IMO0001 Reserved for inherently not codable concepts without codable children: Secondary | ICD-10-CM | POA: Diagnosis not present

## 2012-03-31 DIAGNOSIS — Z8739 Personal history of other diseases of the musculoskeletal system and connective tissue: Secondary | ICD-10-CM | POA: Diagnosis not present

## 2012-03-31 DIAGNOSIS — L0291 Cutaneous abscess, unspecified: Secondary | ICD-10-CM

## 2012-03-31 DIAGNOSIS — W57XXXA Bitten or stung by nonvenomous insect and other nonvenomous arthropods, initial encounter: Secondary | ICD-10-CM

## 2012-03-31 DIAGNOSIS — L02419 Cutaneous abscess of limb, unspecified: Secondary | ICD-10-CM | POA: Insufficient documentation

## 2012-03-31 DIAGNOSIS — Z85828 Personal history of other malignant neoplasm of skin: Secondary | ICD-10-CM | POA: Diagnosis not present

## 2012-03-31 DIAGNOSIS — L0390 Cellulitis, unspecified: Secondary | ICD-10-CM

## 2012-03-31 DIAGNOSIS — Z872 Personal history of diseases of the skin and subcutaneous tissue: Secondary | ICD-10-CM | POA: Insufficient documentation

## 2012-03-31 DIAGNOSIS — Y939 Activity, unspecified: Secondary | ICD-10-CM | POA: Insufficient documentation

## 2012-03-31 DIAGNOSIS — Y929 Unspecified place or not applicable: Secondary | ICD-10-CM | POA: Insufficient documentation

## 2012-03-31 NOTE — ED Notes (Signed)
Redness area marked with felt-tip marker in triage.

## 2012-03-31 NOTE — ED Notes (Signed)
Patient reports that she was seen here for an abscess on her right leg; was given antibiotic before discharge and patient reports that she started the antibiotic again today.  Patient has a follow up appoitnment on Thursday, but was told to come back here if she had increased redness and swelling.  Patient reports that redness has gotten worse; redness noted around bandage site.

## 2012-03-31 NOTE — ED Provider Notes (Signed)
History     CSN: [id number]  Arrival date & time 03/31/12  2042   First MD Initiated Contact with Patient 03/31/12 2132      Chief Complaint  Patient presents with  . Abscess    (Consider location/radiation/quality/duration/timing/severity/associated sxs/prior treatment) Patient is a 32 y.o. female presenting with abscess. The history is provided by the patient. No language interpreter was used.  Abscess Location:  Leg Leg abscess location:  R lower leg Abscess quality: draining, painful and redness   Abscess quality: no fluctuance and no itching   Red streaking: no   Progression:  Improving Pain details:    Quality:  Aching   Severity:  Mild Chronicity:  New Context: insect bite/sting   Context: not diabetes, not immunosuppression, not injected drug use and not skin injury   Relieved by:  Oral antibiotics Associated symptoms: no fever, no nausea and no vomiting     Past Medical History  Diagnosis Date  . Basal cell carcinoma     recurrent in left maxillary, has had 5 surguries, last one 2003  . Fibroma     left ovarian  . Cyst of nasal sinus   . Candidiasis, vagina     recurrent, pt has made lifestyle modifications and none recently (as of 8/12)  . Gardnerella infection     + in 08/08  . [place]low     hx of herpetic requiring I+D,( was bitten by autistic child that cares fr  . Allergic rhinitis     pt takes flonase for episodes, no episodes in 2012  . PLANTAR FASCIITIS, BILATERAL 05/24/2009  . Paronychia of third finger of left hand 07/28/2010  . Fibromyalgia   . Herniated disc     Past Surgical History  Procedure Laterality Date  . Left oophorectomy    . Cholecystectomy    . Nasal sinus surgery      X 2 at age 58 & 45    Family History  Problem Relation Age of Onset  . Stomach cancer      uncle  . Breast cancer      two aunts  . Cancer      unknown grandfather  . Diabetes      grandmother, aunt and 2 uncles  . Diabetes Maternal Grandmother      History  Substance Use Topics  . Smoking status: Never Smoker   . Smokeless tobacco: Not on file  . Alcohol Use: No    OB History   Grav Para Term Preterm Abortions TAB SAB Ect Mult Living                  Review of Systems  Constitutional: Negative.  Negative for fever.  HENT: Negative.   Eyes: Negative.   Respiratory: Negative.   Cardiovascular: Negative.   Gastrointestinal: Negative.  Negative for nausea and vomiting.  Skin:       abscess  Neurological: Negative.   Psychiatric/Behavioral: Negative.   All other systems reviewed and are negative.    Allergies  Review of patient's allergies indicates no known allergies.  Home Medications   Current Outpatient Rx  Name  Route  Sig  Dispense  Refill  . citalopram (CELEXA) 40 MG tablet   Oral   Take 1 tablet (40 mg total) by mouth daily.   30 tablet   6   . doxycycline (VIBRA-TABS) 100 MG tablet   Oral   Take 1 tablet (100 mg total) by mouth 2 (two) times daily.  14 tablet   0   . doxycycline (VIBRA-TABS) 100 MG tablet   Oral   Take 1 tablet (100 mg total) by mouth 2 (two) times daily.   13 tablet   0   . fluconazole (DIFLUCAN) 150 MG tablet   Oral   Take 1 tablet (150 mg total) by mouth once.   1 tablet   0   . methocarbamol (ROBAXIN) 500 MG tablet   Oral   Take 500 mg by mouth 2 (two) times daily.         . pregabalin (LYRICA) 150 MG capsule   Oral   Take 1 capsule (150 mg total) by mouth 2 (two) times daily.   60 capsule   5   . traMADol (ULTRAM) 50 MG tablet      TAKE TWO TABLETS BY MOUTH IN THE MORNING, THEN TAKE ONE TABLET BY MOUTH IN THE AFTERNOON, THEN TAKE TWO TABLETS BY MOUTH IN THE EVENING   160 tablet   0     BP 97/65  Pulse 67  Temp(Src) 99.3 F (37.4 C) (Oral)  Resp 16  SpO2 99%  Physical Exam  Nursing note and vitals reviewed. Constitutional: She is oriented to person, place, and time. She appears well-developed and well-nourished.  HENT:  Head: Normocephalic  and atraumatic.  Eyes: Conjunctivae and EOM are normal. Pupils are equal, round, and reactive to light.  Neck: Normal range of motion. Neck supple.  Cardiovascular: Normal rate.   Pulmonary/Chest: Effort normal.  Abdominal: Soft.  Musculoskeletal: Normal range of motion. She exhibits no edema and no tenderness.  Neurological: She is alert and oriented to person, place, and time. She has normal reflexes.  Skin: Skin is warm and dry.  Abscess to RLE with induration significantly smaller than yesterday.  No fluctuance  Psychiatric: She has a normal mood and affect.    ED Course  Procedures (including critical care time)  Labs Reviewed - No data to display No results found.   No diagnosis found.    MDM  32yo female with LLE abscess from insect bite for recheck today.  She will follow up with her pcp on Thursday. Continue doxycycline until gone.        Remi Haggard, NP 03/31/12 2205

## 2012-04-01 NOTE — ED Provider Notes (Signed)
Medical screening examination/treatment/procedure(s) were performed by non-physician practitioner and as supervising physician I was immediately available for consultation/collaboration.   Joya Gaskins, MD 04/01/12 1039

## 2012-04-01 NOTE — ED Provider Notes (Signed)
Medical screening examination/treatment/procedure(s) were performed by non-physician practitioner and as supervising physician I was immediately available for consultation/collaboration.    Vida Roller, MD 04/01/12 [phone]

## 2012-04-02 ENCOUNTER — Ambulatory Visit (INDEPENDENT_AMBULATORY_CARE_PROVIDER_SITE_OTHER): Payer: Medicare Other | Admitting: Internal Medicine

## 2012-04-02 ENCOUNTER — Encounter: Payer: Self-pay | Admitting: Internal Medicine

## 2012-04-02 VITALS — BP 91/61 | HR 80 | Temp 97.6°F | Ht 69.0 in | Wt 261.1 lb

## 2012-04-02 DIAGNOSIS — L02419 Cutaneous abscess of limb, unspecified: Secondary | ICD-10-CM

## 2012-04-02 DIAGNOSIS — L02415 Cutaneous abscess of right lower limb: Secondary | ICD-10-CM

## 2012-04-02 DIAGNOSIS — C4490 Unspecified malignant neoplasm of skin, unspecified: Secondary | ICD-10-CM

## 2012-04-02 DIAGNOSIS — L03119 Cellulitis of unspecified part of limb: Secondary | ICD-10-CM | POA: Diagnosis not present

## 2012-04-02 MED ORDER — DOXYCYCLINE HYCLATE 100 MG PO TABS: 100.0000 mg | ORAL_TABLET | Freq: Two times a day (BID) | ORAL | Status: DC

## 2012-04-02 NOTE — Assessment & Plan Note (Signed)
ED followup for right lower extremity abscess s/p I&D on 03/30/2012. On exam her swelling and erythema has decreased significantly from the area marked on her initial presentation in the ED. Pictures were also available for comparison in patient's phone. Continue doxycycline for a total of 10 days.  -Patient already had prescriptions for 7 days. She was given 6 more pills to complete 10 days in total for antibiotics. -Return to clinic in one to 2 weeks to make sure the abscess has resolved completely.

## 2012-04-02 NOTE — Assessment & Plan Note (Signed)
She states that she has a followup appointment at [facility] on 04/18/12.

## 2012-04-02 NOTE — Patient Instructions (Addendum)
General Instructions: Please schedule a follow up appointment in 1-2 weeks to make sure the abscess has resolved completely. Please bring your medication bottles with your next appointment. Please take your medicines as prescribed.     Treatment Goals:  Goals (1 Years of Data) as of 04/02/12         02/07/12 05/22/11 04/26/11     Diet    . Reduce calorie intake to 1800-1900 calories per day    Yes    . Reduce fat intake to 45-55 grams per day        . Reduce saturated fat to <15 grams and trans fat to < 2 grams respecitively         Exercise    . Exercise 3x per week (30 min per time)  No         Note created  02/07/2012 10:30 AM by [name] Schwartz, MD   Walk 2 miles 3 x week           . Exercise 5-6x per week (30 min per day and increase as able to 60 minutes per day)   No No      Progress Toward Treatment Goals:    Self Care Goals & Plans:       Care Management & Community Referrals:

## 2012-04-02 NOTE — Progress Notes (Signed)
Subjective:   Patient ID: Jordan [name] female   DOB: November 04, 1980 32 y.o.   MRN: [id number]  HPI: 32 year old woman with past medical history significant for recurrent basal cell carcinoma follows up at [facility]  presents to the clinic for followup of right leg abscess.  Patient was recently seen in the ED twice on 03/30/12 and 2/10 /14  for right leg abscess and presents to the clinic for a followup on that. Patient reports that she noticed a small bump on her right lower extremity that was slowly progressing in size and bursted about 3 days ago in the shower. This led to severe pain associated with swelling and redness in his right lower extremity. She went to the ER and her abscess was I&D and  patient was started on doxycycline. She went to the ED yesterday to get that evaluated and was advised to continue antibiotics. She returns to the clinic today again to get the abscess evaluated . Patient reports improvement in pain -currently rates her pain 3/10, also reports decreased swelling and erythema. Denies any fever, chills, nausea or vomiting, weakness or numbness associated with it.    Past Medical History  Diagnosis Date  . Basal cell carcinoma     recurrent in left maxillary, has had 5 surguries, last one 2003  . Fibroma     left ovarian  . Cyst of nasal sinus   . Candidiasis, vagina     recurrent, pt has made lifestyle modifications and none recently (as of 8/12)  . Gardnerella infection     + in 08/08  . [place]     hx of herpetic requiring I+D,( was bitten by autistic child that cares fr  . Allergic rhinitis     pt takes flonase for episodes, no episodes in 2012  . PLANTAR FASCIITIS, BILATERAL 05/24/2009  . Paronychia of third finger of left hand 07/28/2010  . Fibromyalgia   . Herniated disc    Family History  Problem Relation Age of Onset  . Stomach cancer      uncle  . Breast cancer      two aunts  . Cancer      unknown grandfather  . Diabetes      grandmother, aunt  and 2 uncles  . Diabetes Maternal Grandmother    History   Social History  . Marital Status: Single    Spouse Name: N/A    Number of Children: N/A  . Years of Education: N/A   Occupational History  . unemployed   . disability    Social History Main Topics  . Smoking status: Never Smoker   . Smokeless tobacco: Not on file  . Alcohol Use: No  . Drug Use: No  . Sexually Active: Not on file   Other Topics Concern  . Not on file   Social History Narrative   Lives in [place] in house with partner   Review of Systems: General: Denies fever, chills, diaphoresis, appetite change and fatigue. HEENT: Denies photophobia, eye pain, redness, hearing loss, ear pain, congestion, sore throat, rhinorrhea, sneezing, mouth sores, trouble swallowing, neck pain, neck stiffness and tinnitus. Respiratory: Denies SOB, DOE, cough, chest tightness, and wheezing. Cardiovascular: Denies to chest pain, palpitations and leg swelling. Gastrointestinal: Denies nausea, vomiting, abdominal pain, diarrhea, constipation, blood in stool and abdominal distention. Genitourinary: Denies dysuria, urgency, frequency, hematuria, flank pain and difficulty urinating. Musculoskeletal: Denies myalgias, back pain, joint swelling, arthralgias and gait problem.  Skin: Denies pallor, rash and wound.  Neurological: Denies dizziness, seizures, syncope, weakness, light-headedness, numbness and headaches. Hematological: Denies adenopathy, easy bruising, personal or family bleeding history. Psychiatric/Behavioral: Denies suicidal ideation, mood changes, confusion, nervousness, sleep disturbance and agitation.    Current Outpatient Medications: Current Outpatient Prescriptions  Medication Sig Dispense Refill  . citalopram (CELEXA) 40 MG tablet Take 1 tablet (40 mg total) by mouth daily.  30 tablet  6  . doxycycline (VIBRA-TABS) 100 MG tablet Take 1 tablet (100 mg total) by mouth 2 (two) times daily.  14 tablet  0  .  doxycycline (VIBRA-TABS) 100 MG tablet Take 1 tablet (100 mg total) by mouth 2 (two) times daily.  13 tablet  0  . fluconazole (DIFLUCAN) 150 MG tablet Take 1 tablet (150 mg total) by mouth once.  1 tablet  0  . methocarbamol (ROBAXIN) 500 MG tablet Take 500 mg by mouth 2 (two) times daily.      . pregabalin (LYRICA) 150 MG capsule Take 1 capsule (150 mg total) by mouth 2 (two) times daily.  60 capsule  5  . traMADol (ULTRAM) 50 MG tablet TAKE TWO TABLETS BY MOUTH IN THE MORNING, THEN TAKE ONE TABLET BY MOUTH IN THE AFTERNOON, THEN TAKE TWO TABLETS BY MOUTH IN THE EVENING  160 tablet  0   No current facility-administered medications for this visit.    Allergies: No Known Allergies    Objective:   Physical Exam: Filed Vitals:   04/02/12 0855  BP: 91/61  Pulse: 80  Temp: 97.6 F (36.4 C)    General: Vital signs reviewed and noted. Well-developed, well-nourished, in no acute distress; alert, appropriate and cooperative throughout examination. Head: Normocephalic, atraumatic Lungs: Normal respiratory effort. Clear to auscultation BL without crackles or wheezes. Heart: RRR. S1 and S2 normal without gallop, murmur, or rubs. Abdomen:BS normoactive. Soft, Nondistended, non-tender.  No masses or organomegaly. Extremities: No pretibial edema.     Assessment & Plan:

## 2012-04-03 ENCOUNTER — Ambulatory Visit: Payer: Medicare Other | Admitting: Internal Medicine

## 2012-04-16 ENCOUNTER — Encounter: Payer: Self-pay | Admitting: Internal Medicine

## 2012-04-16 ENCOUNTER — Ambulatory Visit (INDEPENDENT_AMBULATORY_CARE_PROVIDER_SITE_OTHER): Payer: Medicare Other | Admitting: Internal Medicine

## 2012-04-16 VITALS — BP 100/66 | HR 73 | Temp 98.0°F | Ht 69.0 in | Wt 269.1 lb

## 2012-04-16 DIAGNOSIS — L02415 Cutaneous abscess of right lower limb: Secondary | ICD-10-CM

## 2012-04-16 DIAGNOSIS — N898 Other specified noninflammatory disorders of vagina: Secondary | ICD-10-CM | POA: Diagnosis not present

## 2012-04-16 DIAGNOSIS — L02419 Cutaneous abscess of limb, unspecified: Secondary | ICD-10-CM

## 2012-04-16 DIAGNOSIS — L03119 Cellulitis of unspecified part of limb: Secondary | ICD-10-CM | POA: Diagnosis not present

## 2012-04-16 NOTE — Assessment & Plan Note (Signed)
Patient noted a knot in in the fold between thigh and left labia for 1 week and was wondering if it is aboil. On exam, she had about 1cm swelling in the left labial region. DD include reactive lymph node vs upcoming boil. No skin breakdown, surrounding redness or discharge/drainage was noted. No other palpable LN's - Patient was reassured of benign findings and advised to call if it does not get better in 1 month or starts getting bigger or she notices discharge or drainage.

## 2012-04-16 NOTE — Assessment & Plan Note (Signed)
Follow up for her right LE abscess- has resolved completely. Exam showed small superficial about 1mm open area with well formed granulation tissue around , not draining any pus, non erythematous. Patient was reassured on benign findings and healing process.  No more antibiotics.

## 2012-04-16 NOTE — Progress Notes (Signed)
Subjective:   Patient ID: Jordan [name] female   DOB: [birth year] 32 y.o.   MRN: [id number]  HPI: 32 year old woman with past medical history significant for basal cell carcinoma status post surgery and falls at [facility] presents to the clinic for a followup visit.  Patient comes in for a follow up on her  right lower extremity abscess/cellulitis for which she was seen in the clinic about 2-3 weeks ago. She denies any pain, discharge or drainage from the area. She has been putting Neosporin cream and bandage on it. She has completed her course of doxycycline.  She noted a knot in her vaginal area for about a week and wanted to get that evaluated and make sure it's not an abscess.  Denies any fever, chills, vaginal discharge, vaginal itching, urinary symptoms or alteration in bowel habits   Past Medical History  Diagnosis Date  . Basal cell carcinoma     recurrent in left maxillary, has had 5 surguries, last one 2003  . Fibroma     left ovarian  . Cyst of nasal sinus   . Candidiasis, vagina     recurrent, pt has made lifestyle modifications and none recently (as of 8/12)  . Gardnerella infection     + in 08/08  . [place]     hx of herpetic requiring I+D,( was bitten by autistic child that cares fr  . Allergic rhinitis     pt takes flonase for episodes, no episodes in 2012  . PLANTAR FASCIITIS, BILATERAL 05/24/2009  . Paronychia of third finger of left hand 07/28/2010  . Fibromyalgia   . Herniated disc    Family History  Problem Relation Age of Onset  . Stomach cancer      uncle  . Breast cancer      two aunts  . Cancer      unknown grandfather  . Diabetes      grandmother, aunt and 2 uncles  . Diabetes Maternal Grandmother    History   Social History  . Marital Status: Single    Spouse Name: N/A    Number of Children: N/A  . Years of Education: N/A   Occupational History  . unemployed   . disability    Social History Main Topics  . Smoking status: Never Smoker    . Smokeless tobacco: Not on file  . Alcohol Use: No  . Drug Use: No  . Sexually Active: Not on file   Other Topics Concern  . Not on file   Social History Narrative   Lives in [place] in house with partner   Review of Systems: General: Denies fever, chills, diaphoresis, appetite change and fatigue. HEENT: Denies photophobia, eye pain, redness, hearing loss, ear pain, congestion, sore throat, rhinorrhea, sneezing, mouth sores, trouble swallowing, neck pain, neck stiffness and tinnitus. Respiratory: Denies SOB, DOE, cough, chest tightness, and wheezing. Cardiovascular: Denies to chest pain, palpitations and leg swelling. Gastrointestinal: Denies nausea, vomiting, abdominal pain, diarrhea, constipation, blood in stool and abdominal distention. Genitourinary: Denies dysuria, urgency, frequency, hematuria, flank pain and difficulty urinating. Musculoskeletal: Denies myalgias,, joint swelling, arthralgias and gait problem, + back pain  Skin: Denies pallor, rash and wound. Neurological: Denies dizziness, seizures, syncope, weakness, light-headedness, numbness and headaches. Hematological: Denies adenopathy, easy bruising, personal or family bleeding history. Psychiatric/Behavioral: Denies suicidal ideation, mood changes, confusion, nervousness, sleep disturbance and agitation.    Current Outpatient Medications: Current Outpatient Prescriptions  Medication Sig Dispense Refill  . citalopram (CELEXA) 40 MG  tablet Take 1 tablet (40 mg total) by mouth daily.  30 tablet  6  . doxycycline (VIBRA-TABS) 100 MG tablet Take 1 tablet (100 mg total) by mouth 2 (two) times daily.  14 tablet  0  . doxycycline (VIBRA-TABS) 100 MG tablet Take 1 tablet (100 mg total) by mouth 2 (two) times daily.  6 tablet  0  . fluconazole (DIFLUCAN) 150 MG tablet Take 1 tablet (150 mg total) by mouth once.  1 tablet  0  . methocarbamol (ROBAXIN) 500 MG tablet Take 500 mg by mouth 2 (two) times daily.      .  pregabalin (LYRICA) 150 MG capsule Take 1 capsule (150 mg total) by mouth 2 (two) times daily.  60 capsule  5  . traMADol (ULTRAM) 50 MG tablet TAKE TWO TABLETS BY MOUTH IN THE MORNING, THEN TAKE ONE TABLET BY MOUTH IN THE AFTERNOON, THEN TAKE TWO TABLETS BY MOUTH IN THE EVENING  160 tablet  0   No current facility-administered medications for this visit.    Allergies: No Known Allergies    Objective:   Physical Exam: Filed Vitals:   04/16/12 0945  BP: 100/66  Pulse: 73  Temp: 98 F (36.7 C)    General: Vital signs reviewed and noted. Well-developed, well-nourished, in no acute distress; alert, appropriate and cooperative throughout examination. Head: Normocephalic, atraumatic Lungs: Normal respiratory effort. Clear to auscultation BL without crackles or wheezes. Heart: RRR. S1 and S2 normal without gallop, murmur, or rubs. Abdomen:BS normoactive. Soft, Nondistended, non-tender.  No masses or organomegaly. Extremities: No pretibial edema. About 1 mm circular superficial open area in her right lower extremity with some yellow crusting but no pus or discharge noted. No surrounding erythema warmth or tenderness.  Genitourinary exam: ~ one millimeter freely mobile swelling in the fold between the labia and the thigh. Non tender, no surrounding erythema. Intact skin with no, drainage or discharge. No other palpable inguinal lymph nodes     Assessment & Plan:

## 2012-04-16 NOTE — Patient Instructions (Addendum)
General Instructions: The knot in your left inguinal could be reactive LN or starting of a boil . Call us if it does not get better by itself in 1 month or if starts ozozoning pus or blood.     Treatment Goals:  Goals (1 Years of Data) as of 04/16/12         02/07/12 05/22/11 04/26/11     Diet    . Reduce calorie intake to 1800-1900 calories per day    Yes    . Reduce fat intake to 45-55 grams per day        . Reduce saturated fat to <15 grams and trans fat to < 2 grams respecitively         Exercise    . Exercise 3x per week (30 min per time)  No         Note created  02/07/2012 10:30 AM by [name] Schwartz, MD   Walk 2 miles 3 x week           . Exercise 5-6x per week (30 min per day and increase as able to 60 minutes per day)   No No      Progress Toward Treatment Goals:    Self Care Goals & Plans:       Care Management & Community Referrals:

## 2012-04-18 ENCOUNTER — Other Ambulatory Visit: Payer: Self-pay | Admitting: *Deleted

## 2012-04-18 NOTE — Telephone Encounter (Signed)
Last refill of tramadol was 2/27 # 30  Rx was written 12/10 with 3 refills for # 30.  Directions are 5 tabs a day, so pt is getting a 6 days supply.  She is requesting increase in # of tabs.

## 2012-04-21 MED ORDER — TRAMADOL HCL 50 MG PO TABS: ORAL_TABLET | ORAL | Status: DC

## 2012-04-23 DIAGNOSIS — M545 Low back pain, unspecified: Secondary | ICD-10-CM | POA: Diagnosis not present

## 2012-04-29 ENCOUNTER — Telehealth: Payer: Self-pay | Admitting: *Deleted

## 2012-04-29 NOTE — Telephone Encounter (Signed)
Pt called and left message for refill of her "anti-inflammatory" i called the pharmacy and they state that she is requesting meloxicam 15mg  1 daily #30, it is usually prescribed by dr Charlsie Merles, podiatrist, and they have sent him the request. i don't see this on her medlist so i thought i would let you know, i confirmed this with pt.

## 2012-05-05 ENCOUNTER — Encounter: Payer: Medicare Other | Admitting: Internal Medicine

## 2012-05-05 ENCOUNTER — Ambulatory Visit: Payer: Medicare Other | Attending: Orthopedic Surgery | Admitting: Physical Therapy

## 2012-05-05 DIAGNOSIS — IMO0001 Reserved for inherently not codable concepts without codable children: Secondary | ICD-10-CM | POA: Insufficient documentation

## 2012-05-05 DIAGNOSIS — M545 Low back pain, unspecified: Secondary | ICD-10-CM | POA: Diagnosis not present

## 2012-05-08 ENCOUNTER — Ambulatory Visit: Payer: Medicare Other | Admitting: Physical Therapy

## 2012-05-08 DIAGNOSIS — M545 Low back pain, unspecified: Secondary | ICD-10-CM | POA: Diagnosis not present

## 2012-05-08 DIAGNOSIS — IMO0001 Reserved for inherently not codable concepts without codable children: Secondary | ICD-10-CM | POA: Diagnosis not present

## 2012-05-12 ENCOUNTER — Ambulatory Visit: Payer: Medicare Other | Admitting: Physical Therapy

## 2012-05-12 DIAGNOSIS — IMO0001 Reserved for inherently not codable concepts without codable children: Secondary | ICD-10-CM | POA: Diagnosis not present

## 2012-05-12 DIAGNOSIS — M545 Low back pain, unspecified: Secondary | ICD-10-CM | POA: Diagnosis not present

## 2012-05-15 ENCOUNTER — Ambulatory Visit: Payer: Medicare Other | Admitting: Physical Therapy

## 2012-05-19 ENCOUNTER — Ambulatory Visit: Payer: Medicare Other | Admitting: Rehabilitation

## 2012-05-21 DIAGNOSIS — M545 Low back pain, unspecified: Secondary | ICD-10-CM | POA: Diagnosis not present

## 2012-05-22 ENCOUNTER — Encounter: Payer: Medicare Other | Admitting: Physical Therapy

## 2012-06-16 ENCOUNTER — Ambulatory Visit (INDEPENDENT_AMBULATORY_CARE_PROVIDER_SITE_OTHER): Payer: Medicare Other | Admitting: Internal Medicine

## 2012-06-16 ENCOUNTER — Encounter: Payer: Self-pay | Admitting: Internal Medicine

## 2012-06-16 VITALS — BP 101/68 | HR 74 | Temp 98.0°F | Ht 69.0 in | Wt 262.8 lb

## 2012-06-16 DIAGNOSIS — M797 Fibromyalgia: Secondary | ICD-10-CM

## 2012-06-16 DIAGNOSIS — F329 Major depressive disorder, single episode, unspecified: Secondary | ICD-10-CM | POA: Diagnosis not present

## 2012-06-16 DIAGNOSIS — IMO0001 Reserved for inherently not codable concepts without codable children: Secondary | ICD-10-CM | POA: Diagnosis not present

## 2012-06-16 DIAGNOSIS — H539 Unspecified visual disturbance: Secondary | ICD-10-CM

## 2012-06-16 DIAGNOSIS — E669 Obesity, unspecified: Secondary | ICD-10-CM | POA: Diagnosis not present

## 2012-06-16 DIAGNOSIS — M549 Dorsalgia, unspecified: Secondary | ICD-10-CM | POA: Diagnosis not present

## 2012-06-16 DIAGNOSIS — R5382 Chronic fatigue, unspecified: Secondary | ICD-10-CM

## 2012-06-16 DIAGNOSIS — C4490 Unspecified malignant neoplasm of skin, unspecified: Secondary | ICD-10-CM

## 2012-06-16 DIAGNOSIS — R5381 Other malaise: Secondary | ICD-10-CM

## 2012-06-16 DIAGNOSIS — Z9889 Other specified postprocedural states: Secondary | ICD-10-CM

## 2012-06-16 DIAGNOSIS — R5383 Other fatigue: Secondary | ICD-10-CM | POA: Diagnosis not present

## 2012-06-16 DIAGNOSIS — G4700 Insomnia, unspecified: Secondary | ICD-10-CM

## 2012-06-16 DIAGNOSIS — F32A Depression, unspecified: Secondary | ICD-10-CM

## 2012-06-16 DIAGNOSIS — E785 Hyperlipidemia, unspecified: Secondary | ICD-10-CM

## 2012-06-16 LAB — CBC
HCT: 40.1 % (ref 36.0–46.0)
Hemoglobin: 13.4 g/dL (ref 12.0–15.0)
MCH: 27.8 pg (ref 26.0–34.0)
MCHC: 33.4 g/dL (ref 30.0–36.0)
MCV: 83.2 fL (ref 78.0–100.0)
Platelets: 206 10*3/uL (ref 150–400)
RBC: 4.82 MIL/uL (ref 3.87–5.11)
RDW: 14.8 % (ref 11.5–15.5)
WBC: 5 10*3/uL (ref 4.0–10.5)

## 2012-06-16 LAB — HEPATIC FUNCTION PANEL
ALT: 12 U/L (ref 0–35)
AST: 13 U/L (ref 0–37)
Albumin: 3.8 g/dL (ref 3.5–5.2)
Alkaline Phosphatase: 33 U/L — ABNORMAL LOW (ref 39–117)
Bilirubin, Direct: 0.1 mg/dL (ref 0.0–0.3)
Indirect Bilirubin: 0.3 mg/dL (ref 0.0–0.9)
Total Bilirubin: 0.4 mg/dL (ref 0.3–1.2)
Total Protein: 6.4 g/dL (ref 6.0–8.3)

## 2012-06-16 LAB — BASIC METABOLIC PANEL
BUN: 11 mg/dL (ref 6–23)
CO2: 26 mEq/L (ref 19–32)
Calcium: 9 mg/dL (ref 8.4–10.5)
Chloride: 103 mEq/L (ref 96–112)
Creat: 0.77 mg/dL (ref 0.50–1.10)
Glucose, Bld: 88 mg/dL (ref 70–99)
Potassium: 4.1 mEq/L (ref 3.5–5.3)
Sodium: 135 mEq/L (ref 135–145)

## 2012-06-16 LAB — LIPID PANEL
Cholesterol: 215 mg/dL — ABNORMAL HIGH (ref 0–200)
HDL: 83 mg/dL (ref >39)
LDL Cholesterol: 113 mg/dL — ABNORMAL HIGH (ref 0–99)
Total CHOL/HDL Ratio: 2.6 Ratio
Triglycerides: 93 mg/dL (ref <150)
VLDL: 19 mg/dL (ref 0–40)

## 2012-06-16 MED ORDER — TRAMADOL HCL 50 MG PO TABS: ORAL_TABLET | ORAL | Status: DC

## 2012-06-16 NOTE — Progress Notes (Signed)
Subjective:    Patient ID: Jordan [name], female    DOB: 05/15/1980, 32 y.o.   MRN: [id number]  HPI  Jordan [name] presents to clinic for f/u of fibromyalgia pain and medication refills.. Her hx is significant for depression and anxiety stable on Celexa, fibromyalgia managed with  Lyrica, obesity with BMI  ~37 s/p intensive behavioral therapy with Nutritional Educator, chronic back pain stable on robaxin and tramadol and h/o basal carcinoma for which she follows with [facility] (most recent Feb 2014). 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  After 8 hours of work she returned home severely exhausted with myalgias throughout.  She states that she  is slowly improving back to her baseline fatigue. She has not continued working although she is motivated and anxious to find some form of employment that would not worsen her fibromyalgia symptoms.  Review of Systems  Constitutional: Negative for fever and fatigue.  HENT: Negative for congestion.   Respiratory: Negative for shortness of breath.   Cardiovascular: Negative for chest pain.  Gastrointestinal: Negative for nausea, vomiting, diarrhea and constipation.  Genitourinary: Negative for dysuria.  Musculoskeletal: Positive for myalgias and back pain.  Skin: Negative for rash.  Neurological: Negative for weakness and headaches.  Psychiatric/Behavioral: Negative for dysphoric mood.       Objective:   Physical Exam  Constitutional: She is oriented to person, place, and time. She appears well-developed and well-nourished.  HENT:  Head: Normocephalic and atraumatic.  Eyes: Conjunctivae and EOM are normal. Pupils are equal, round, and reactive to light.  Neck: Normal range of motion. Neck supple.  Cardiovascular: Normal rate, regular rhythm, normal heart sounds and intact distal pulses.   Pulmonary/Chest: Effort normal and breath sounds normal.  Abdominal: Soft. Bowel  sounds are normal. There is no tenderness.  Musculoskeletal: Normal range of motion. She exhibits edema and tenderness.       Right wrist: She exhibits tenderness.       Left wrist: She exhibits tenderness.       Right knee: Tenderness found.       Left knee: Tenderness found.       Cervical back: She exhibits tenderness.       Lumbar back: She exhibits tenderness.       Right lower leg: She exhibits edema.       Left lower leg: She exhibits edema.  Trace bilateral LE edema  Neurological: She is alert and oriented to person, place, and time.  Skin: Skin is warm and dry.  Psychiatric: She has a normal mood and affect. Thought content normal.          Assessment & Plan:  1. Fibromyalgia: acute flare that is improving, well managed with Lyrica 150 mg qd -she is still very tender at multiple trigger points but improving -will refill tramadol which pt also uses for chronic back pain   2. 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  She has dual coverage with Medicare thus will refer back to Physical Therapy as pt is very motivated to learn techniques to improve and/or stabilize her functional status. -also states that she had imagining at [facility] (Dr. Turner Daniels) some time ago thus will request records and Xray reports -cont tramadol -PT  3.  Obesity: lost about 8 lbs walking, states she has not been able to do this since the fibromyalgia flare but encouraged her to resume once she can tolerate longer walks  4. Depression/Anxiety: stable on celexa and trazodone (for intermittent insomnia) -check liver panel given that Celexa is hepatically metabolized  5. H/o basal cell carcinoma to face, jaw, sinuses, ovary and fallopian tube: s/p surgical excision, pt is a Medical laboratory scientific officer Study participant at [employer]; last  evaluated Feb 2014 at [facility].  Pt is s/p right salpingo-oopherectomy  6. Visual Disturbance: pt c/o intermittent visual "haziness, like heat off of a sidewalk" but no problem reading or difficulty seeing images -refer to Ophthamology -check BMET and assess for possble metabolic syndrome  7. Hypercholesteremia: will check lipid panel today

## 2012-06-16 NOTE — Patient Instructions (Addendum)
It was nice to see you today, Jordan [name].  I am sorry to hear that working your most recent job caused worsening of your fibromyalgia.   Continue to take the Lyrica and other medications as prescribed. The trazodone should help with your sleep as well. We will get records and XRay reports of your back from Dr. Wadie Lessen office. We have put in for another referral for Physical Therapy. We have also referred you to an eye doctor for your vision issues. We will check some blood work today.  This will show how your blood sugar, cholesterol and kidney function is doing. We will contact you if you need to be on another medication or there is other concern. Follow-up with me in 4-6 months after you have tried physical therapy.

## 2012-06-16 NOTE — Assessment & Plan Note (Signed)
Lost 7 lbs since last visit.  Will try to resume walking which she stopped after flare of her symptoms.

## 2012-06-17 ENCOUNTER — Other Ambulatory Visit: Payer: Self-pay | Admitting: Internal Medicine

## 2012-06-17 NOTE — Assessment & Plan Note (Signed)
Acute flare secondary to strenous physical activity -cont Lyrica 150 mg qd

## 2012-06-17 NOTE — Assessment & Plan Note (Signed)
Stable, cont trazodone and lyrica

## 2012-06-17 NOTE — Assessment & Plan Note (Signed)
Stable on Celexa 40 mg qd

## 2012-06-23 NOTE — Progress Notes (Signed)
Case discussed with Dr. Karen Schooler  at the time of the visit, immediately after the resident saw the patient.  I reviewed the resident's history and exam and pertinent patient test results.  I agree with the assessment, diagnosis and plan of care documented in the resident's note.  

## 2012-06-30 ENCOUNTER — Ambulatory Visit: Payer: Medicare Other | Attending: Internal Medicine | Admitting: Physical Therapy

## 2012-06-30 DIAGNOSIS — M545 Low back pain, unspecified: Secondary | ICD-10-CM | POA: Insufficient documentation

## 2012-06-30 DIAGNOSIS — IMO0001 Reserved for inherently not codable concepts without codable children: Secondary | ICD-10-CM | POA: Insufficient documentation

## 2012-07-09 ENCOUNTER — Ambulatory Visit: Payer: Medicare Other | Admitting: Rehabilitation

## 2012-07-09 DIAGNOSIS — M545 Low back pain, unspecified: Secondary | ICD-10-CM | POA: Diagnosis not present

## 2012-07-09 DIAGNOSIS — IMO0001 Reserved for inherently not codable concepts without codable children: Secondary | ICD-10-CM | POA: Diagnosis not present

## 2012-07-11 ENCOUNTER — Ambulatory Visit: Payer: Medicare Other | Admitting: Physical Therapy

## 2012-07-16 ENCOUNTER — Ambulatory Visit: Payer: Medicare Other | Admitting: Rehabilitation

## 2012-07-16 DIAGNOSIS — M5126 Other intervertebral disc displacement, lumbar region: Secondary | ICD-10-CM | POA: Diagnosis not present

## 2012-07-18 ENCOUNTER — Ambulatory Visit: Payer: Medicare Other | Admitting: Physical Therapy

## 2012-07-22 ENCOUNTER — Encounter: Payer: Medicare Other | Admitting: Physical Therapy

## 2012-07-24 ENCOUNTER — Ambulatory Visit: Payer: Medicare Other | Admitting: Physical Therapy

## 2012-07-24 DIAGNOSIS — H5710 Ocular pain, unspecified eye: Secondary | ICD-10-CM | POA: Diagnosis not present

## 2012-07-24 DIAGNOSIS — H1045 Other chronic allergic conjunctivitis: Secondary | ICD-10-CM | POA: Diagnosis not present

## 2012-07-26 DIAGNOSIS — M47817 Spondylosis without myelopathy or radiculopathy, lumbosacral region: Secondary | ICD-10-CM | POA: Diagnosis not present

## 2012-07-29 ENCOUNTER — Ambulatory Visit: Payer: Medicare Other | Attending: Orthopedic Surgery | Admitting: Physical Therapy

## 2012-07-29 DIAGNOSIS — M545 Low back pain, unspecified: Secondary | ICD-10-CM | POA: Diagnosis not present

## 2012-07-29 DIAGNOSIS — IMO0001 Reserved for inherently not codable concepts without codable children: Secondary | ICD-10-CM | POA: Diagnosis not present

## 2012-07-31 ENCOUNTER — Ambulatory Visit: Payer: Medicare Other | Admitting: Physical Therapy

## 2012-07-31 DIAGNOSIS — M545 Low back pain, unspecified: Secondary | ICD-10-CM | POA: Diagnosis not present

## 2012-07-31 DIAGNOSIS — IMO0001 Reserved for inherently not codable concepts without codable children: Secondary | ICD-10-CM | POA: Diagnosis not present

## 2012-08-05 ENCOUNTER — Ambulatory Visit: Payer: Medicare Other | Admitting: Physical Therapy

## 2012-08-05 DIAGNOSIS — IMO0001 Reserved for inherently not codable concepts without codable children: Secondary | ICD-10-CM | POA: Diagnosis not present

## 2012-08-05 DIAGNOSIS — M5126 Other intervertebral disc displacement, lumbar region: Secondary | ICD-10-CM | POA: Diagnosis not present

## 2012-08-05 DIAGNOSIS — M545 Low back pain, unspecified: Secondary | ICD-10-CM | POA: Diagnosis not present

## 2012-08-07 ENCOUNTER — Ambulatory Visit: Payer: Medicare Other | Admitting: Physical Therapy

## 2012-08-08 ENCOUNTER — Other Ambulatory Visit: Payer: Self-pay | Admitting: Orthopedic Surgery

## 2012-08-08 ENCOUNTER — Encounter (HOSPITAL_COMMUNITY): Payer: Self-pay | Admitting: Pharmacy Technician

## 2012-08-08 DIAGNOSIS — M5126 Other intervertebral disc displacement, lumbar region: Secondary | ICD-10-CM | POA: Diagnosis not present

## 2012-08-12 ENCOUNTER — Encounter (HOSPITAL_COMMUNITY)
Admission: RE | Admit: 2012-08-12 | Discharge: 2012-08-12 | Disposition: A | Discharge status: Home / Self Care | Payer: Medicare Other | Source: Ambulatory Visit | Attending: Orthopedic Surgery | Admitting: Orthopedic Surgery

## 2012-08-12 ENCOUNTER — Ambulatory Visit (HOSPITAL_COMMUNITY)
Admission: RE | Admit: 2012-08-12 | Discharge: 2012-08-12 | Disposition: A | Discharge status: Home / Self Care | Payer: Medicare Other | Source: Ambulatory Visit | Attending: Orthopedic Surgery | Admitting: Orthopedic Surgery

## 2012-08-12 ENCOUNTER — Encounter (HOSPITAL_COMMUNITY): Payer: Self-pay

## 2012-08-12 DIAGNOSIS — Z0183 Encounter for blood typing: Secondary | ICD-10-CM | POA: Diagnosis not present

## 2012-08-12 DIAGNOSIS — Z01810 Encounter for preprocedural cardiovascular examination: Secondary | ICD-10-CM | POA: Diagnosis not present

## 2012-08-12 DIAGNOSIS — Z01818 Encounter for other preprocedural examination: Secondary | ICD-10-CM | POA: Diagnosis not present

## 2012-08-12 DIAGNOSIS — Z01812 Encounter for preprocedural laboratory examination: Secondary | ICD-10-CM | POA: Insufficient documentation

## 2012-08-12 HISTORY — DX: Gastro-esophageal reflux disease without esophagitis: K21.9

## 2012-08-12 HISTORY — DX: Anxiety disorder, unspecified: F41.9

## 2012-08-12 LAB — CBC WITH DIFFERENTIAL/PLATELET
Basophils Absolute: 0 10*3/uL (ref 0.0–0.1)
Basophils Relative: 0 % (ref 0–1)
Eosinophils Absolute: 0.1 10*3/uL (ref 0.0–0.7)
Eosinophils Relative: 2 % (ref 0–5)
HCT: 39.4 % (ref 36.0–46.0)
Hemoglobin: 13.5 g/dL (ref 12.0–15.0)
Lymphocytes Relative: 61 % — ABNORMAL HIGH (ref 12–46)
Lymphs Abs: 2.8 10*3/uL (ref 0.7–4.0)
MCH: 29 pg (ref 26.0–34.0)
MCHC: 34.3 g/dL (ref 30.0–36.0)
MCV: 84.5 fL (ref 78.0–100.0)
Monocytes Absolute: 0.4 10*3/uL (ref 0.1–1.0)
Monocytes Relative: 10 % (ref 3–12)
Neutro Abs: 1.3 10*3/uL — ABNORMAL LOW (ref 1.7–7.7)
Neutrophils Relative %: 28 % — ABNORMAL LOW (ref 43–77)
Platelets: 186 10*3/uL (ref 150–400)
RBC: 4.66 MIL/uL (ref 3.87–5.11)
RDW: 14.2 % (ref 11.5–15.5)
WBC: 4.6 10*3/uL (ref 4.0–10.5)

## 2012-08-12 LAB — COMPREHENSIVE METABOLIC PANEL
ALT: 9 U/L (ref 0–35)
AST: 15 U/L (ref 0–37)
Albumin: 3.5 g/dL (ref 3.5–5.2)
Alkaline Phosphatase: 46 U/L (ref 39–117)
BUN: 14 mg/dL (ref 6–23)
CO2: 23 mEq/L (ref 19–32)
Calcium: 8.9 mg/dL (ref 8.4–10.5)
Chloride: 103 mEq/L (ref 96–112)
Creatinine, Ser: 0.65 mg/dL (ref 0.50–1.10)
GFR calc Af Amer: >90 mL/min (ref >90)
GFR calc non Af Amer: >90 mL/min (ref >90)
Glucose, Bld: 101 mg/dL — ABNORMAL HIGH (ref 70–99)
Potassium: 4 mEq/L (ref 3.5–5.1)
Sodium: 136 mEq/L (ref 135–145)
Total Bilirubin: 0.2 mg/dL — ABNORMAL LOW (ref 0.3–1.2)
Total Protein: 6.9 g/dL (ref 6.0–8.3)

## 2012-08-12 LAB — URINALYSIS, ROUTINE W REFLEX MICROSCOPIC
Bilirubin Urine: NEGATIVE
Glucose, UA: NEGATIVE mg/dL
Ketones, ur: 15 mg/dL — AB
Nitrite: NEGATIVE
Protein, ur: NEGATIVE mg/dL
Specific Gravity, Urine: 1.03 (ref 1.005–1.030)
Urobilinogen, UA: 1 mg/dL (ref 0.0–1.0)
pH: 7 (ref 5.0–8.0)

## 2012-08-12 LAB — SURGICAL PCR SCREEN
MRSA, PCR: NEGATIVE
Staphylococcus aureus: NEGATIVE

## 2012-08-12 LAB — URINE MICROSCOPIC-ADD ON

## 2012-08-12 LAB — ABO/RH: ABO/RH(D): AB POS

## 2012-08-12 LAB — TYPE AND SCREEN
ABO/RH(D): AB POS
Antibody Screen: NEGATIVE

## 2012-08-12 LAB — PROTIME-INR
INR: 0.94 (ref 0.00–1.49)
Prothrombin Time: 12.5 seconds (ref 11.6–15.2)

## 2012-08-12 LAB — HCG, SERUM, QUALITATIVE: Preg, Serum: NEGATIVE

## 2012-08-12 LAB — APTT: aPTT: 30 seconds (ref 24–37)

## 2012-08-12 NOTE — Pre-Procedure Instructions (Addendum)
Jordan [name]  08/12/2012   Your procedure is scheduled on:08/14/12  Report to [facility] at 530 AM.  Call this number if you have problems the morning of surgery: [phone]   Remember:   Do not eat food or drink liquids after midnight.   Take these medicines the morning of surgery with A SIP OF WATER: celexa,lyrica, eye drops, tramodol if needed              STOP meloxicam now   Do not wear jewelry, make-up or nail polish.  Do not wear lotions, powders, or perfumes. You may wear deodorant.  Do not shave 48 hours prior to surgery. Men may shave face and neck.  Do not bring valuables to the hospital.  [facility] is not responsible                   for any belongings or valuables.  Contacts, dentures or bridgework may not be worn into surgery.  Leave suitcase in the car. After surgery it may be brought to your room.  For patients admitted to the hospital, checkout time is 11:00 AM the day of  discharge.   Patients discharged the day of surgery will not be allowed to drive  home.  Name and phone number of your driver:  Special Instructions: Incentive Spirometry - Practice and bring it with you on the day of surgery. Shower using CHG 2 nights before surgery and the night before surgery.  If you shower the day of surgery use CHG.  Use special wash - you have one bottle of CHG for all showers.  You should use approximately 1/3 of the bottle for each shower.   Please read over the following fact sheets that you were given: Pain Booklet, Coughing and Deep Breathing, Blood Transfusion Information, MRSA Information and Surgical Site Infection Prevention

## 2012-08-13 MED ORDER — POVIDONE-IODINE 7.5 % EX SOLN
Freq: Once | CUTANEOUS | Status: DC
  Filled 2012-08-13: qty 118

## 2012-08-13 MED ORDER — CEFAZOLIN SODIUM-DEXTROSE 2-3 GM-% IV SOLR
2.0000 g | INTRAVENOUS | Status: AC
  Administered 2012-08-14: 2 g via INTRAVENOUS
  Filled 2012-08-13 (×2): qty 50

## 2012-08-14 ENCOUNTER — Other Ambulatory Visit: Payer: Self-pay

## 2012-08-14 ENCOUNTER — Encounter (HOSPITAL_COMMUNITY): Payer: Self-pay | Admitting: Anesthesiology

## 2012-08-14 ENCOUNTER — Encounter (HOSPITAL_COMMUNITY)
Admission: RE | Disposition: A | Discharge status: Home / Self Care | Payer: Self-pay | Source: Ambulatory Visit | Attending: Orthopedic Surgery

## 2012-08-14 ENCOUNTER — Inpatient Hospital Stay (HOSPITAL_COMMUNITY): Payer: Medicare Other

## 2012-08-14 ENCOUNTER — Inpatient Hospital Stay (HOSPITAL_COMMUNITY): Payer: Medicare Other | Admitting: Anesthesiology

## 2012-08-14 ENCOUNTER — Inpatient Hospital Stay (HOSPITAL_COMMUNITY)
Admission: RE | Admit: 2012-08-14 | Discharge: 2012-08-14 | DRG: 491 | Disposition: A | Discharge status: Home / Self Care | Payer: Medicare Other | Source: Ambulatory Visit | Attending: Orthopedic Surgery | Admitting: Orthopedic Surgery

## 2012-08-14 DIAGNOSIS — F411 Generalized anxiety disorder: Secondary | ICD-10-CM | POA: Diagnosis not present

## 2012-08-14 DIAGNOSIS — IMO0002 Reserved for concepts with insufficient information to code with codable children: Secondary | ICD-10-CM | POA: Diagnosis not present

## 2012-08-14 DIAGNOSIS — M5126 Other intervertebral disc displacement, lumbar region: Secondary | ICD-10-CM | POA: Diagnosis not present

## 2012-08-14 DIAGNOSIS — K219 Gastro-esophageal reflux disease without esophagitis: Secondary | ICD-10-CM | POA: Diagnosis not present

## 2012-08-14 DIAGNOSIS — IMO0001 Reserved for inherently not codable concepts without codable children: Secondary | ICD-10-CM | POA: Diagnosis not present

## 2012-08-14 DIAGNOSIS — M79609 Pain in unspecified limb: Secondary | ICD-10-CM | POA: Diagnosis not present

## 2012-08-14 HISTORY — PX: LUMBAR LAMINECTOMY/DECOMPRESSION MICRODISCECTOMY: SHX5026

## 2012-08-14 SURGERY — LUMBAR LAMINECTOMY/DECOMPRESSION MICRODISCECTOMY
Anesthesia: General | Site: Spine Lumbar | Wound class: Clean

## 2012-08-14 MED ORDER — MEPERIDINE HCL 25 MG/ML IJ SOLN: 6.2500 mg | INTRAMUSCULAR | Status: DC | PRN

## 2012-08-14 MED ORDER — INDIGOTINDISULFONATE SODIUM 8 MG/ML IJ SOLN
INTRAMUSCULAR | Status: DC | PRN
  Administered 2012-08-14: .5 mL

## 2012-08-14 MED ORDER — INDIGOTINDISULFONATE SODIUM 8 MG/ML IJ SOLN
INTRAMUSCULAR | Status: AC
  Filled 2012-08-14: qty 5

## 2012-08-14 MED ORDER — EPHEDRINE SULFATE 50 MG/ML IJ SOLN
INTRAMUSCULAR | Status: DC | PRN
  Administered 2012-08-14 (×2): 25 mg via INTRAVENOUS

## 2012-08-14 MED ORDER — BUPIVACAINE-EPINEPHRINE 0.25% -1:200000 IJ SOLN
INTRAMUSCULAR | Status: DC | PRN
  Administered 2012-08-14: 15 mL

## 2012-08-14 MED ORDER — ROCURONIUM BROMIDE 100 MG/10ML IV SOLN
INTRAVENOUS | Status: DC | PRN
  Administered 2012-08-14: 10 mg via INTRAVENOUS
  Administered 2012-08-14: 50 mg via INTRAVENOUS

## 2012-08-14 MED ORDER — HEMOSTATIC AGENTS (NO CHARGE) OPTIME
TOPICAL | Status: DC | PRN
  Administered 2012-08-14: 1

## 2012-08-14 MED ORDER — 0.9 % SODIUM CHLORIDE (POUR BTL) OPTIME
TOPICAL | Status: DC | PRN
  Administered 2012-08-14: 1000 mL

## 2012-08-14 MED ORDER — ONDANSETRON HCL 4 MG/2ML IJ SOLN
INTRAMUSCULAR | Status: DC | PRN
  Administered 2012-08-14: 4 mg via INTRAVENOUS

## 2012-08-14 MED ORDER — MIDAZOLAM HCL 5 MG/5ML IJ SOLN
INTRAMUSCULAR | Status: DC | PRN
  Administered 2012-08-14: 2 mg via INTRAVENOUS

## 2012-08-14 MED ORDER — FENTANYL CITRATE 0.05 MG/ML IJ SOLN
INTRAMUSCULAR | Status: DC | PRN
  Administered 2012-08-14: 225 ug via INTRAVENOUS
  Administered 2012-08-14: 25 ug via INTRAVENOUS

## 2012-08-14 MED ORDER — PHENYLEPHRINE HCL 10 MG/ML IJ SOLN
INTRAMUSCULAR | Status: DC | PRN
  Administered 2012-08-14 (×2): 80 ug via INTRAVENOUS

## 2012-08-14 MED ORDER — OXYCODONE HCL 5 MG/5ML PO SOLN: 5.0000 mg | Freq: Once | ORAL | Status: DC | PRN

## 2012-08-14 MED ORDER — METHYLPREDNISOLONE ACETATE 40 MG/ML IJ SUSP
INTRAMUSCULAR | Status: AC
  Filled 2012-08-14: qty 1

## 2012-08-14 MED ORDER — OXYCODONE HCL 5 MG PO TABS: 5.0000 mg | ORAL_TABLET | Freq: Once | ORAL | Status: DC | PRN

## 2012-08-14 MED ORDER — LACTATED RINGERS IV SOLN
INTRAVENOUS | Status: DC | PRN
  Administered 2012-08-14 (×2): via INTRAVENOUS

## 2012-08-14 MED ORDER — HYDROMORPHONE HCL PF 1 MG/ML IJ SOLN
0.2500 mg | INTRAMUSCULAR | Status: DC | PRN
  Administered 2012-08-14: 0.5 mg via INTRAVENOUS

## 2012-08-14 MED ORDER — ARTIFICIAL TEARS OP OINT
TOPICAL_OINTMENT | OPHTHALMIC | Status: DC | PRN
  Administered 2012-08-14: 1 via OPHTHALMIC

## 2012-08-14 MED ORDER — MIDAZOLAM HCL 2 MG/2ML IJ SOLN: 0.5000 mg | Freq: Once | INTRAMUSCULAR | Status: DC | PRN

## 2012-08-14 MED ORDER — PROMETHAZINE HCL 25 MG/ML IJ SOLN: 6.2500 mg | INTRAMUSCULAR | Status: DC | PRN

## 2012-08-14 MED ORDER — BUPIVACAINE-EPINEPHRINE PF 0.25-1:200000 % IJ SOLN
INTRAMUSCULAR | Status: AC
  Filled 2012-08-14: qty 30

## 2012-08-14 MED ORDER — THROMBIN 20000 UNITS EX SOLR
CUTANEOUS | Status: DC | PRN
  Administered 2012-08-14: 08:00:00

## 2012-08-14 MED ORDER — LIDOCAINE HCL (CARDIAC) 20 MG/ML IV SOLN
INTRAVENOUS | Status: DC | PRN
  Administered 2012-08-14: 30 mg via INTRAVENOUS

## 2012-08-14 MED ORDER — HYDROMORPHONE HCL PF 1 MG/ML IJ SOLN
INTRAMUSCULAR | Status: AC
  Filled 2012-08-14: qty 1

## 2012-08-14 MED ORDER — METHYLPREDNISOLONE ACETATE 40 MG/ML IJ SUSP
INTRAMUSCULAR | Status: DC | PRN
  Administered 2012-08-14: 40 mg

## 2012-08-14 MED ORDER — NEOSTIGMINE METHYLSULFATE 1 MG/ML IJ SOLN
INTRAMUSCULAR | Status: DC | PRN
  Administered 2012-08-14: 4 mg via INTRAVENOUS

## 2012-08-14 MED ORDER — THROMBIN 20000 UNITS EX SOLR
CUTANEOUS | Status: AC
  Filled 2012-08-14: qty 20000

## 2012-08-14 MED ORDER — GLYCOPYRROLATE 0.2 MG/ML IJ SOLN
INTRAMUSCULAR | Status: DC | PRN
  Administered 2012-08-14: .8 mg via INTRAVENOUS

## 2012-08-14 MED ORDER — PROPOFOL 10 MG/ML IV BOLUS
INTRAVENOUS | Status: DC | PRN
  Administered 2012-08-14: 200 mg via INTRAVENOUS

## 2012-08-14 SURGICAL SUPPLY — 72 items
APL SKNCLS STERI-STRIP NONHPOA (GAUZE/BANDAGES/DRESSINGS) ×1
BENZOIN TINCTURE PRP APPL 2/3 (GAUZE/BANDAGES/DRESSINGS) ×1 IMPLANT
BUR ROUND PRECISION 4.0 (BURR) ×2 IMPLANT
CANISTER SUCTION 2500CC (MISCELLANEOUS) ×2 IMPLANT
CLOTH BEACON ORANGE TIMEOUT ST (SAFETY) ×2 IMPLANT
CLSR STERI-STRIP ANTIMIC 1/2X4 (GAUZE/BANDAGES/DRESSINGS) ×1 IMPLANT
CORDS BIPOLAR (ELECTRODE) ×2 IMPLANT
COVER SURGICAL LIGHT HANDLE (MISCELLANEOUS) ×2 IMPLANT
DRAIN CHANNEL 15F RND FF W/TCR (WOUND CARE) IMPLANT
DRAPE POUCH INSTRU U-SHP 10X18 (DRAPES) ×4 IMPLANT
DRAPE SURG 17X23 STRL (DRAPES) ×8 IMPLANT
DURAPREP 26ML APPLICATOR (WOUND CARE) ×2 IMPLANT
ELECT BLADE 4.0 EZ CLEAN MEGAD (MISCELLANEOUS)
ELECT CAUTERY BLADE 6.4 (BLADE) ×2 IMPLANT
ELECT REM PT RETURN 9FT ADLT (ELECTROSURGICAL) ×2
ELECTRODE BLDE 4.0 EZ CLN MEGD (MISCELLANEOUS) IMPLANT
ELECTRODE REM PT RTRN 9FT ADLT (ELECTROSURGICAL) ×1 IMPLANT
EVACUATOR SILICONE 100CC (DRAIN) IMPLANT
FILTER STRAW FLUID ASPIR (MISCELLANEOUS) ×2 IMPLANT
GAUZE SPONGE 4X4 16PLY XRAY LF (GAUZE/BANDAGES/DRESSINGS) ×4 IMPLANT
GLOVE BIO SURGEON STRL SZ7 (GLOVE) ×2 IMPLANT
GLOVE BIO SURGEON STRL SZ8 (GLOVE) ×2 IMPLANT
GLOVE BIOGEL PI IND STRL 7.0 (GLOVE) ×1 IMPLANT
GLOVE BIOGEL PI IND STRL 8 (GLOVE) ×1 IMPLANT
GLOVE BIOGEL PI INDICATOR 7.0 (GLOVE) ×1
GLOVE BIOGEL PI INDICATOR 8 (GLOVE) ×1
GOWN STRL NON-REIN LRG LVL3 (GOWN DISPOSABLE) ×4 IMPLANT
GOWN STRL REIN XL XLG (GOWN DISPOSABLE) ×2 IMPLANT
IV CATH 14GX2 1/4 (CATHETERS) ×2 IMPLANT
KIT BASIN OR (CUSTOM PROCEDURE TRAY) ×2 IMPLANT
KIT ROOM TURNOVER OR (KITS) ×2 IMPLANT
NDL 18GX1X1/2 (RX/OR ONLY) (NEEDLE) ×1 IMPLANT
NDL HYPO 25GX1X1/2 BEV (NEEDLE) ×1 IMPLANT
NDL SPNL 18GX3.5 QUINCKE PK (NEEDLE) ×2 IMPLANT
NEEDLE 18GX1X1/2 (RX/OR ONLY) (NEEDLE) ×2 IMPLANT
NEEDLE 22X1 1/2 (OR ONLY) (NEEDLE) ×1 IMPLANT
NEEDLE HYPO 25GX1X1/2 BEV (NEEDLE) ×2 IMPLANT
NEEDLE SPNL 18GX3.5 QUINCKE PK (NEEDLE) ×4 IMPLANT
NS IRRIG 1000ML POUR BTL (IV SOLUTION) ×2 IMPLANT
PACK LAMINECTOMY ORTHO (CUSTOM PROCEDURE TRAY) ×2 IMPLANT
PACK UNIVERSAL I (CUSTOM PROCEDURE TRAY) ×2 IMPLANT
PAD ARMBOARD 7.5X6 YLW CONV (MISCELLANEOUS) ×4 IMPLANT
PATTIES SURGICAL .5 X.5 (GAUZE/BANDAGES/DRESSINGS) IMPLANT
PATTIES SURGICAL .5 X1 (DISPOSABLE) ×2 IMPLANT
SPONGE GAUZE 4X4 12PLY (GAUZE/BANDAGES/DRESSINGS) ×2 IMPLANT
SPONGE INTESTINAL PEANUT (DISPOSABLE) ×1 IMPLANT
SPONGE SURGIFOAM ABS GEL 100 (HEMOSTASIS) ×2 IMPLANT
STRIP CLOSURE SKIN 1/2X4 (GAUZE/BANDAGES/DRESSINGS) IMPLANT
SURGIFLO TRUKIT (HEMOSTASIS) IMPLANT
SURGIFLO W/THROMBIN 8M KIT (HEMOSTASIS) ×1 IMPLANT
SUT MNCRL AB 4-0 PS2 18 (SUTURE) ×3 IMPLANT
SUT VIC AB 0 CT1 18XCR BRD 8 (SUTURE) IMPLANT
SUT VIC AB 0 CT1 27 (SUTURE)
SUT VIC AB 0 CT1 27XBRD ANBCTR (SUTURE) IMPLANT
SUT VIC AB 0 CT1 8-18 (SUTURE) ×2
SUT VIC AB 1 CT1 18XCR BRD 8 (SUTURE) ×1 IMPLANT
SUT VIC AB 1 CT1 27 (SUTURE) ×2
SUT VIC AB 1 CT1 27XBRD ANBCTR (SUTURE) IMPLANT
SUT VIC AB 1 CT1 8-18 (SUTURE) ×2
SUT VIC AB 2-0 CT1 27 (SUTURE) ×2
SUT VIC AB 2-0 CT1 TAPERPNT 27 (SUTURE) IMPLANT
SUT VIC AB 2-0 CT2 18 VCP726D (SUTURE) ×3 IMPLANT
SYR 20CC LL (SYRINGE) IMPLANT
SYR BULB IRRIGATION 50ML (SYRINGE) ×2 IMPLANT
SYR CONTROL 10ML LL (SYRINGE) ×4 IMPLANT
SYR TB 1ML 26GX3/8 SAFETY (SYRINGE) ×4 IMPLANT
SYR TB 1ML LUER SLIP (SYRINGE) ×4 IMPLANT
TAPE CLOTH SURG 4X10 WHT LF (GAUZE/BANDAGES/DRESSINGS) ×1 IMPLANT
TOWEL OR 17X24 6PK STRL BLUE (TOWEL DISPOSABLE) ×2 IMPLANT
TOWEL OR 17X26 10 PK STRL BLUE (TOWEL DISPOSABLE) ×2 IMPLANT
WATER STERILE IRR 1000ML POUR (IV SOLUTION) ×2 IMPLANT
YANKAUER SUCT BULB TIP NO VENT (SUCTIONS) ×2 IMPLANT

## 2012-08-14 NOTE — Anesthesia Postprocedure Evaluation (Signed)
  Anesthesia Post-op Note  Patient: Jordan [name]  Procedure(s) Performed: Procedure(s) with comments: LUMBAR LAMINECTOMY/DECOMPRESSION MICRODISCECTOMY Lumbar 5 -sacrum 1 decompression (N/A) - Lumbar 5 -sacrum 1 decompression  Patient Location: PACU  Anesthesia Type:General  Level of Consciousness: awake, alert , oriented and patient cooperative  Airway and Oxygen Therapy: Patient Spontanous Breathing  Post-op Pain: none  Post-op Assessment: Post-op Vital signs reviewed, Patient's Cardiovascular Status Stable, Respiratory Function Stable, Patent Airway, No signs of Nausea or vomiting and Pain level controlled  Post-op Vital Signs: Reviewed and stable  Complications: No apparent anesthesia complications

## 2012-08-14 NOTE — Transfer of Care (Signed)
Immediate Anesthesia Transfer of Care Note  Patient: Jordan [name]  Procedure(s) Performed: Procedure(s) with comments: LUMBAR LAMINECTOMY/DECOMPRESSION MICRODISCECTOMY Lumbar 5 -sacrum 1 decompression (N/A) - Lumbar 5 -sacrum 1 decompression  Patient Location: PACU  Anesthesia Type:General  Level of Consciousness: awake, alert  and oriented  Airway & Oxygen Therapy: Patient Spontanous Breathing and Patient connected to nasal cannula oxygen  Post-op Assessment: Report given to PACU RN, Post -op Vital signs reviewed and stable and Patient moving all extremities X 4  Post vital signs: Reviewed  Complications: No apparent anesthesia complications

## 2012-08-14 NOTE — Preoperative (Signed)
Beta Blockers   Reason not to administer Beta Blockers:Not Applicable 

## 2012-08-14 NOTE — H&P (Signed)
PREOPERATIVE H&P  Chief Complaint: left leg pain  HPI: Jordan [name] is a 32 y.o. female who presents with progressive pain and progressive weakness involving the left leg. Patient failed conservative treatment measures.   Past Medical History  Diagnosis Date  . Basal cell carcinoma     recurrent in left maxillary, has had 5 surguries, last one 2003  . Fibroma     left ovarian  . Cyst of nasal sinus   . Candidiasis, vagina     recurrent, pt has made lifestyle modifications and none recently (as of 8/12)  . Gardnerella infection     + in 08/08  . [place]     hx of herpetic requiring I+D,( was bitten by autistic child that cares fr  . Allergic rhinitis     pt takes flonase for episodes, no episodes in 2012  . PLANTAR FASCIITIS, BILATERAL 05/24/2009  . Paronychia of third finger of left hand 07/28/2010  . Fibromyalgia   . Herniated disc   . Anxiety   . GERD (gastroesophageal reflux disease)     occ   Past Surgical History  Procedure Laterality Date  . Left oophorectomy    . Cholecystectomy    . Nasal sinus surgery      X 2 at age 7 & 57  . Mandible reconstruction  92    upper anfd lower jaw cyst   History   Social History  . Marital Status: Single    Spouse Name: N/A    Number of Children: N/A  . Years of Education: N/A   Occupational History  . unemployed   . disability    Social History Main Topics  . Smoking status: Never Smoker   . Smokeless tobacco: None  . Alcohol Use: No  . Drug Use: No  . Sexually Active: None   Other Topics Concern  . None   Social History Narrative   Lives in [place] in house with partner   Family History  Problem Relation Age of Onset  . Stomach cancer      uncle  . Breast cancer      two aunts  . Cancer      unknown grandfather  . Diabetes      grandmother, aunt and 2 uncles  . Diabetes Maternal Grandmother    No Known Allergies Prior to Admission medications   Medication Sig Start Date End Date Taking?  Authorizing Provider  acetaminophen (TYLENOL) 650 MG CR tablet Take 650 mg by mouth every 8 (eight) hours as needed for pain.   Yes Historical Provider, MD  citalopram (CELEXA) 40 MG tablet Take 1 tablet (40 mg total) by mouth daily. 12/19/11  Yes [name] Schwartz, MD  meloxicam (MOBIC) 15 MG tablet Take 15 mg by mouth at bedtime.   Yes Historical Provider, MD  pregabalin (LYRICA) 150 MG capsule Take 1 capsule (150 mg total) by mouth 2 (two) times daily. 03/21/12 03/21/13 Yes Burns Spain, MD  PRESCRIPTION MEDICATION Place 1 drop into both eyes 2 (two) times daily as needed. Dry eyes   Yes Historical Provider, MD  traMADol (ULTRAM) 50 MG tablet Take 50-100 mg by mouth 3 (three) times daily as needed for pain. Take 2 tablet in the morning, 1 tablet in the afternoon and  2 tablets in the evening   Yes Historical Provider, MD     All other systems have been reviewed and were otherwise negative with the exception of those mentioned in the HPI and as  above.  Physical Exam: Filed Vitals:   08/14/12 0622  BP: 96/65  Temp: 98.3 F (36.8 C)  Resp: 18    General: Alert, no acute distress Cardiovascular: No pedal edema Respiratory: No cyanosis, no use of accessory musculature GI: No organomegaly, abdomen is soft and non-tender Skin: No lesions in the area of chief complaint Neurologic: Sensation intact distally Psychiatric: Patient is competent for consent with normal mood and affect Lymphatic: No axillary or cervical lymphadenopathy  MUSCULOSKELETAL: + SLR on left  MRI: large central L5/S1 HNP  Assessment/Plan: Left leg pain Plan for Procedure(s):  LUMBAR DECOMPRESSION, Lumbar 5 -sacrum 1    Emilee Hero, MD 08/14/2012 7:13 AM

## 2012-08-14 NOTE — Anesthesia Preprocedure Evaluation (Addendum)
Anesthesia Evaluation  Patient identified by MRN, date of birth, ID band Patient awake    Reviewed: Allergy & Precautions, H&P , NPO status , Patient's Chart, lab work & pertinent test results  History of Anesthesia Complications Negative for: history of anesthetic complications  Airway Mallampati: I TM Distance: >3 FB Neck ROM: full    Dental  (+) Teeth Intact and Dental Advidsory Given   Pulmonary neg pulmonary ROS,  breath sounds clear to auscultation  Pulmonary exam normal       Cardiovascular negative cardio ROS  Rhythm:Regular Rate:Normal     Neuro/Psych Chronic back pain: narcotics  Neuromuscular disease    GI/Hepatic Neg liver ROS, GERD-  Controlled,  Endo/Other  Morbid obesity  Renal/GU negative Renal ROS     Musculoskeletal   Abdominal (+) + obese,   Peds  Hematology   Anesthesia Other Findings   Reproductive/Obstetrics LMP yesterday                          Anesthesia Physical Anesthesia Plan  ASA: II  Anesthesia Plan: General   Post-op Pain Management:    Induction: Intravenous  Airway Management Planned: Oral ETT  Additional Equipment:   Intra-op Plan:   Post-operative Plan: Extubation in OR  Informed Consent: I have reviewed the patients History and Physical, chart, labs and discussed the procedure including the risks, benefits and alternatives for the proposed anesthesia with the patient or authorized representative who has indicated his/her understanding and acceptance.   Dental Advisory Given  Plan Discussed with: Anesthesiologist, CRNA and Surgeon  Anesthesia Plan Comments: (Plan routine monitors, GETA )       Anesthesia Quick Evaluation

## 2012-08-15 ENCOUNTER — Encounter (HOSPITAL_COMMUNITY): Payer: Self-pay | Admitting: Orthopedic Surgery

## 2012-08-15 NOTE — Op Note (Signed)
NAME:  Jordan [name]               ACCOUNT NO.:  [id number]  MEDICAL RECORD NO.:  [id number]  LOCATION:  [facility]                         FACILITY:  [facility]  PHYSICIAN:  Estill Bamberg, MD      DATE OF BIRTH:  1980-06-28  DATE OF PROCEDURE:  08/14/2012 DATE OF DISCHARGE:  08/14/2012                              OPERATIVE REPORT   PREOPERATIVE DIAGNOSIS: 1. Large extruded L5-S1 disk herniation resulting in severe     compression of the left lateral recess. 2. Left-sided S1 radiculopathy resulting in severe left leg pain,     numbness, and weakness.  POSTOPERATIVE DIAGNOSIS: 1. Large extruded L5-S1 disk herniation resulting in severe     compression of the left lateral recess. 2. Left-sided S1 radiculopathy resulting in severe left leg pain,     numbness, and weakness.  PROCEDURES:  L5-S1 decompression with bilateral partial facetectomy and removal of very large L5-S1 disk fragment.  SURGEON:  Estill Bamberg, MD  ASSISTANT:  Jason Coop, PA-C  ANESTHESIA:  General endotracheal anesthesia.  COMPLICATIONS:  None.  DISPOSITION:  Stable.  ESTIMATED BLOOD LOSS:  Minimal.  INDICATIONS FOR PROCEDURE:  Briefly, [name] is a pleasant 32 year old female, who did present to me with ongoing and severe left leg pain, which did begin in 2010.  She did have a series of multiple injections, where her pain did continue.  On examination, she did clearly have weakness and numbness.  An MRI was reviewed, which was notable for a very large L5-S1 herniated disk fragment.  I did feel this was clearly correlating to the patient's symptomatology.  We, therefore, did have a discussion regarding going forward with a central L5-S1 decompression in bilateral partial facetectomy with removal of very large disk fragment. The patient fully understood all the risks and limitations of the procedure as outlined in my preoperative note.  OPERATIVE DETAILS:  On August 14, 2012, the patient was brought to  surgery and general endotracheal anesthesia was administered.  The patient was placed prone on a well-padded flat Jackson bed with spinal frame. Antibiotics were given.  The back was prepped and draped in the usual sterile fashion.  Time-out procedure was performed.  I then made a midline incision of approximately 2 inches in length overlying the L5-S1 interspace.  The bilateral paraspinal musculature was bluntly swept laterally.  I then obtained a lateral intraoperative radiograph to confirm the appropriate operative level.  I then subperiosteally exposed the lamina of L5 and S1.  The spinous process of L5 was removed.  I then went forward with a central and bilateral lateral recess decompression. The ligamentum flavum was identified and removed.  I was able to identify a very prominent area of stenosis on the left side overlying the L5-S1 interspace.  I did perform a wide central and bilateral lateral recess decompression, in order to leave appropriate space for safe retraction of this traversing S1 nerve.  I was ultimately able to bluntly sweep the S1 nerve medially.  A very large and prominent herniated disk fragment was readily identified.  I did use a #15-blade knife to perform an oblique an annulotomy at the posterolateral aspect of the anulus of the disk.  Through this annulotomy, a very large herniated L5-S1 disk fragment did clear itself.  I continued to explore the epidural space and the lateral recess and there were additional fragments of disk that were removed.  I did work within the anulus while removing the disk fragments.  At the termination of this portion of the procedure, I was easily able to mobilize the traversing left S1 nerve both medially and laterally.  There was epidural bleeding encountered, which was adequately controlled using Surgiflo.  I then used bone wax to help control the punctate bleeding from the cancellous bone from the laminotomy defect.  The wound  was then copiously irrigated with 500 mL of normal saline.  I then introduced 40 mg of Depo-Medrol about the epidural space overlying the region of the traversing left S1 nerve.  At this point, the wound was explored.  There was no undue bleeding and there was no extravasation of cerebrospinal fluid.  I then closed the fascia using #1 Vicryl.  The subcutaneous layer was closed using 0 Vicryl followed by 2-0 Vicryl.  The skin was closed using 3-0 Monocryl. Benzoin and Steri-Strips were applied followed by sterile dressing.  All instrument counts were correct at the termination of the procedure.  Of note, Jason Coop was my assistant throughout the entirety of the procedure, and did aide in essential retraction and suctioning required throughout the surgery.     Estill Bamberg, MD     MD/MEDQ  D:  08/14/2012  T:  08/15/2012  Job:  [id number]  cc:   Shirley Friar, MD

## 2012-08-25 ENCOUNTER — Ambulatory Visit (INDEPENDENT_AMBULATORY_CARE_PROVIDER_SITE_OTHER): Payer: Medicare Other | Admitting: Internal Medicine

## 2012-08-25 ENCOUNTER — Encounter: Payer: Self-pay | Admitting: Internal Medicine

## 2012-08-25 VITALS — BP 87/59 | HR 71 | Temp 99.3°F | Ht 69.0 in | Wt 262.7 lb

## 2012-08-25 DIAGNOSIS — M797 Fibromyalgia: Secondary | ICD-10-CM

## 2012-08-25 DIAGNOSIS — IMO0001 Reserved for inherently not codable concepts without codable children: Secondary | ICD-10-CM | POA: Diagnosis not present

## 2012-08-25 DIAGNOSIS — F32A Depression, unspecified: Secondary | ICD-10-CM

## 2012-08-25 DIAGNOSIS — G4700 Insomnia, unspecified: Secondary | ICD-10-CM

## 2012-08-25 DIAGNOSIS — F329 Major depressive disorder, single episode, unspecified: Secondary | ICD-10-CM

## 2012-08-25 DIAGNOSIS — M549 Dorsalgia, unspecified: Secondary | ICD-10-CM

## 2012-08-25 MED ORDER — PREGABALIN 150 MG PO CAPS: 150.0000 mg | ORAL_CAPSULE | Freq: Two times a day (BID) | ORAL | Status: DC

## 2012-08-25 NOTE — Progress Notes (Signed)
  Subjective:    Patient ID: Jordan [name], female    DOB: January 15, 1981, 32 y.o.   MRN: [id number]  HPI  Pt presents for f/u of fibromyalgia, depression, insomnia and back pain. No back pain since sacral decompression per Dr. Ottis Stain of [facility] June 2014. Fibromyalgia relatively stable but states lately she has hypersensitivity on arms, back and legs. Depression and insomnia greatly improved on Lyrica and Celexa. States she has been out of the house most of the day and hasnt had much fluids. Feels fine per her report but sbp 87. Denies lightedheadness, shortness of breath or chest pain. On no antihypertensives.   Would like suggestions for sweatiness in the "underwear area" which in turns makes her itch. Pt reports that she wears boxer shorts instead of regular female underpants.  Review of Systems  Constitutional: Negative for fever and fatigue.  HENT: Negative for congestion.   Respiratory: Negative for shortness of breath.   Cardiovascular: Negative for chest pain and leg swelling.  Gastrointestinal: Negative for nausea, diarrhea and constipation.  Genitourinary: Negative for dysuria and pelvic pain.  Musculoskeletal: Positive for myalgias. Negative for back pain.  Skin: Negative for rash.  Neurological: Negative for dizziness, weakness and light-headedness.  Psychiatric/Behavioral: Negative for confusion and dysphoric mood.       Objective:   Physical Exam  Constitutional: She is oriented to person, place, and time. She appears well-developed and well-nourished. No distress.  HENT:  Head: Normocephalic and atraumatic.  Eyes: Conjunctivae and EOM are normal. Pupils are equal, round, and reactive to light.  Cardiovascular: Normal rate, regular rhythm, normal heart sounds and intact distal pulses.   No murmur heard. Pulmonary/Chest: Effort normal and breath sounds normal. She has no wheezes. She has no rales.  Abdominal: Soft. Bowel sounds are normal.  Genitourinary: Vagina  normal.  Musculoskeletal: She exhibits tenderness. She exhibits no edema.       Cervical back: She exhibits tenderness.       Right forearm: She exhibits tenderness.       Left forearm: She exhibits tenderness.  Neurological: She is alert and oriented to person, place, and time.  Skin: Skin is warm and dry.  Psychiatric: She has a normal mood and affect. Her behavior is normal. Judgment and thought content normal.          Assessment & Plan:  See detailed problem oriented charting 1. Fibromyalgia: acute exacerbation --increase Lyrica 150 mg qam, 150-300 qpm  2. Intertriginous itching: recommend cotton under wear and keeping area clean,  No signs of candida today --cornstarch powder for perineal sweating and itchiness  3.  Low blood pressure --increase fluid intake, will contact clinic for presyncopal symptoms  4. Back pain: improved s/p sacral decompression  5. Depression: stable and improved, cont Celexa and Lyrica  6. Insomnia: improved on Celexa

## 2012-08-26 NOTE — Progress Notes (Signed)
Case discussed with Dr. Schooler soon after the resident saw the patient.  We reviewed the resident's history and exam and pertinent patient test results.  I agree with the assessment, diagnosis, and plan of care documented in the resident's note. 

## 2012-09-18 ENCOUNTER — Other Ambulatory Visit: Payer: Self-pay | Admitting: Internal Medicine

## 2012-09-24 NOTE — Telephone Encounter (Signed)
Dr Bosie Clos is on vacation.  Thanks

## 2012-09-24 NOTE — Telephone Encounter (Signed)
This medication was refilled on 08/25/12 with 5 additional refills.  Please confirm receipt by pharmacy.

## 2012-09-24 NOTE — Telephone Encounter (Signed)
Pharmacy called  And informed of Lyrica rx from 7/7.

## 2012-09-29 ENCOUNTER — Encounter: Payer: Medicare Other | Admitting: Internal Medicine

## 2012-10-13 DIAGNOSIS — L600 Ingrowing nail: Secondary | ICD-10-CM | POA: Diagnosis not present

## 2012-10-13 DIAGNOSIS — B351 Tinea unguium: Secondary | ICD-10-CM | POA: Diagnosis not present

## 2012-10-13 DIAGNOSIS — M79609 Pain in unspecified limb: Secondary | ICD-10-CM | POA: Diagnosis not present

## 2012-11-14 ENCOUNTER — Other Ambulatory Visit: Payer: Self-pay | Admitting: Internal Medicine

## 2012-11-14 ENCOUNTER — Telehealth: Payer: Self-pay | Admitting: Internal Medicine

## 2012-11-14 DIAGNOSIS — M797 Fibromyalgia: Secondary | ICD-10-CM

## 2012-11-14 MED ORDER — PREGABALIN 150 MG PO CAPS: 150.0000 mg | ORAL_CAPSULE | Freq: Two times a day (BID) | ORAL | Status: DC

## 2012-11-20 NOTE — Telephone Encounter (Signed)
x

## 2012-11-21 DIAGNOSIS — F39 Unspecified mood [affective] disorder: Secondary | ICD-10-CM | POA: Diagnosis not present

## 2012-12-01 ENCOUNTER — Ambulatory Visit (INDEPENDENT_AMBULATORY_CARE_PROVIDER_SITE_OTHER): Payer: Medicare Other | Admitting: Internal Medicine

## 2012-12-01 ENCOUNTER — Other Ambulatory Visit: Payer: Self-pay | Admitting: Internal Medicine

## 2012-12-01 ENCOUNTER — Encounter: Payer: Self-pay | Admitting: Internal Medicine

## 2012-12-01 VITALS — BP 93/62 | HR 73 | Temp 97.5°F | Ht 69.0 in | Wt 253.4 lb

## 2012-12-01 DIAGNOSIS — F32A Depression, unspecified: Secondary | ICD-10-CM

## 2012-12-01 DIAGNOSIS — N898 Other specified noninflammatory disorders of vagina: Secondary | ICD-10-CM

## 2012-12-01 DIAGNOSIS — F329 Major depressive disorder, single episode, unspecified: Secondary | ICD-10-CM | POA: Diagnosis not present

## 2012-12-01 DIAGNOSIS — G4700 Insomnia, unspecified: Secondary | ICD-10-CM

## 2012-12-01 DIAGNOSIS — Z23 Encounter for immunization: Secondary | ICD-10-CM

## 2012-12-01 DIAGNOSIS — E669 Obesity, unspecified: Secondary | ICD-10-CM | POA: Diagnosis not present

## 2012-12-01 DIAGNOSIS — M549 Dorsalgia, unspecified: Secondary | ICD-10-CM

## 2012-12-01 DIAGNOSIS — IMO0001 Reserved for inherently not codable concepts without codable children: Secondary | ICD-10-CM

## 2012-12-01 DIAGNOSIS — M797 Fibromyalgia: Secondary | ICD-10-CM

## 2012-12-01 MED ORDER — TRAMADOL HCL 50 MG PO TABS: 50.0000 mg | ORAL_TABLET | Freq: Four times a day (QID) | ORAL | Status: DC | PRN

## 2012-12-01 MED ORDER — METRONIDAZOLE 500 MG PO TABS: 2000.0000 mg | ORAL_TABLET | Freq: Once | ORAL | Status: DC

## 2012-12-01 MED ORDER — CITALOPRAM HYDROBROMIDE 40 MG PO TABS: 40.0000 mg | ORAL_TABLET | Freq: Every day | ORAL | Status: DC

## 2012-12-01 NOTE — Progress Notes (Signed)
  Subjective:    Patient ID: Jordan [name], female    DOB: 25-Apr-1980, 32 y.o.   MRN: [id number]  HPI  PMHx significant for fibromyalgia, depression, and frequent bacterial vaginosis.  Presents with complaints of clear watery discharge and itchiness.  No associated smell.  States that he feels "like all the other times" she had bacterial vaginosis.  Otherwise doing well on Lyrica and Celexa with good control of pain and mood.  States that she has a part time job at [employer] and has been walking 2 miles a day.  Would like to start playing softball. Requesting Tramadol refill.  Review of Systems  Constitutional: Negative for fatigue and unexpected weight change.  Respiratory: Negative for shortness of breath.   Cardiovascular: Negative for chest pain and leg swelling.  Gastrointestinal: Negative for constipation and rectal pain.  Genitourinary: Positive for vaginal discharge. Negative for hematuria, vaginal bleeding, genital sores, vaginal pain and pelvic pain.  Musculoskeletal:       Leg pain if she stands too long.  Neurological: Negative for weakness and headaches.  Psychiatric/Behavioral: Negative for confusion and dysphoric mood.       Objective:   Physical Exam  Constitutional: She is oriented to person, place, and time. She appears well-developed and well-nourished. No distress.  HENT:  Head: Normocephalic and atraumatic.  Eyes: Conjunctivae are normal. Pupils are equal, round, and reactive to light.  Neck: Normal range of motion. Neck supple. No thyromegaly present.  Cardiovascular: Normal rate, regular rhythm, normal heart sounds and intact distal pulses.   No murmur heard. Pulmonary/Chest: Effort normal. No respiratory distress. She has no wheezes.  Abdominal: Soft. Bowel sounds are normal. There is no tenderness.  Genitourinary:  defered by patient  Musculoskeletal: Normal range of motion. She exhibits tenderness. She exhibits no edema.  Neurological: She is alert and  oriented to person, place, and time.  Skin: Skin is warm and dry.  Psychiatric: She has a normal mood and affect.          Assessment & Plan:   See problem list charting for details:  1. Fibromyalgia: stable -cont Lyrica  -cont exercise regimen  2. Depression: improved and stable  -cont Celexa  3. Insomnia: improved with Lyrica and Celexa  4: Preventative Health: flu shot today  5. Vaginal discharge: likely recurrent bacterial vaginosis: reviewed preventive measures -RX metronidazole  6. Back pain: improved since surgery -refilled Tramadol

## 2012-12-01 NOTE — Assessment & Plan Note (Signed)
Likely repeat BV.  Will treat empirically with metronidazole. 2g x1

## 2012-12-01 NOTE — Assessment & Plan Note (Signed)
Improved on lyrica

## 2012-12-01 NOTE — Assessment & Plan Note (Addendum)
Walking 2 miles daily, wants to start a "cleanse diet".  Reviewed ingredients which includes kale, cucumber, apples and other vegetables but no supplemental additives.  Advised to continue small meals and water intake.

## 2012-12-01 NOTE — Patient Instructions (Signed)
It was good to see you again today, Ms. Towers. I am glad to hear that you have a new dog, started working and have been walking daily for exercise. Playing softball will be great exercise for your muscles.  Listen to your body and start slow. For your vaginal discharge, take the metronidazole as instructed. If your symptoms persist please return to clinic so that we may evaluate you further. I have refilled your Tramadol, Celexa, and Lyrica. Return to see me in 6 months or earlier if needed.

## 2012-12-01 NOTE — Assessment & Plan Note (Signed)
Stable on lyrica and celexa

## 2012-12-02 NOTE — Assessment & Plan Note (Signed)
S/p back surgery, reports feeling very good, managed with tramadol. Tramadol refill given today

## 2012-12-02 NOTE — Assessment & Plan Note (Signed)
Improved mood without depressive symptoms on Celexa and Lyrica

## 2012-12-04 NOTE — Addendum Note (Signed)
Addended by: Debe Coder B on: 12/04/2012 02:11 PM   Modules accepted: Level of Service

## 2012-12-04 NOTE — Progress Notes (Signed)
Case discussed with Dr. Kazibwe soon after the resident saw the patient.  We reviewed the resident's history and exam and pertinent patient test results.  I agree with the assessment, diagnosis, and plan of care documented in the resident's note. 

## 2012-12-05 DIAGNOSIS — M545 Low back pain, unspecified: Secondary | ICD-10-CM | POA: Diagnosis not present

## 2012-12-10 ENCOUNTER — Ambulatory Visit: Payer: Medicare Other | Attending: Orthopedic Surgery | Admitting: Physical Therapy

## 2012-12-10 ENCOUNTER — Ambulatory Visit: Payer: Medicare Other | Admitting: Physical Therapy

## 2012-12-10 DIAGNOSIS — M545 Low back pain, unspecified: Secondary | ICD-10-CM | POA: Diagnosis not present

## 2012-12-10 DIAGNOSIS — IMO0001 Reserved for inherently not codable concepts without codable children: Secondary | ICD-10-CM | POA: Insufficient documentation

## 2012-12-16 ENCOUNTER — Ambulatory Visit: Payer: Medicare Other | Admitting: Rehabilitation

## 2012-12-23 ENCOUNTER — Ambulatory Visit: Payer: Medicare Other | Attending: Orthopedic Surgery | Admitting: Rehabilitation

## 2012-12-23 DIAGNOSIS — M545 Low back pain, unspecified: Secondary | ICD-10-CM | POA: Diagnosis not present

## 2012-12-23 DIAGNOSIS — IMO0001 Reserved for inherently not codable concepts without codable children: Secondary | ICD-10-CM | POA: Diagnosis not present

## 2012-12-25 ENCOUNTER — Ambulatory Visit: Payer: Medicare Other | Admitting: Physical Therapy

## 2012-12-26 DIAGNOSIS — F39 Unspecified mood [affective] disorder: Secondary | ICD-10-CM | POA: Diagnosis not present

## 2012-12-31 DIAGNOSIS — F39 Unspecified mood [affective] disorder: Secondary | ICD-10-CM | POA: Diagnosis not present

## 2013-01-14 ENCOUNTER — Encounter (HOSPITAL_COMMUNITY): Payer: Self-pay | Admitting: Emergency Medicine

## 2013-01-14 ENCOUNTER — Emergency Department (INDEPENDENT_AMBULATORY_CARE_PROVIDER_SITE_OTHER)
Admission: EM | Admit: 2013-01-14 | Discharge: 2013-01-14 | Disposition: A | Discharge status: Home / Self Care | Payer: Medicare Other | Source: Home / Self Care | Attending: Emergency Medicine | Admitting: Emergency Medicine

## 2013-01-14 ENCOUNTER — Emergency Department (INDEPENDENT_AMBULATORY_CARE_PROVIDER_SITE_OTHER): Payer: Medicare Other

## 2013-01-14 DIAGNOSIS — S90129A Contusion of unspecified lesser toe(s) without damage to nail, initial encounter: Secondary | ICD-10-CM

## 2013-01-14 DIAGNOSIS — S90211A Contusion of right great toe with damage to nail, initial encounter: Secondary | ICD-10-CM

## 2013-01-14 DIAGNOSIS — S8990XA Unspecified injury of unspecified lower leg, initial encounter: Secondary | ICD-10-CM | POA: Diagnosis not present

## 2013-01-14 NOTE — ED Notes (Signed)
Reportedly dropped sharp object onto her toe 2 days ago

## 2013-01-14 NOTE — ED Provider Notes (Signed)
Chief Complaint:   Chief Complaint  Patient presents with  . Foot Injury    History of Present Illness:   Jordan [name] is a 32 year old female who dropped a fork on her right great toe 2 days ago. It landed tined side down, striking her toe. Ever since then she's had pain over the nail and has noted a collection of blood underneath the nail. It hurts to touch, to move, to wear shoes, and to walk. She denies any numbness or tingling.  Review of Systems:  Other than noted above, the patient denies any of the following symptoms: Systemic:  No fevers, chills, or sweats.  No fatigue or tiredness. Musculoskeletal:  No joint pain, arthritis, bursitis, swelling, or back pain.  Neurological:  No muscular weakness, paresthesias.  PMFSH:  Past medical history, family history, social history, meds, and allergies were reviewed.  Physical Exam:   Vital signs:  BP 100/69  Pulse 88  Temp(Src) 98.7 F (37.1 C) (Oral)  Resp 18  SpO2 99%  LMP 01/05/2013 Gen:  Alert and oriented times 3.  In no distress. Musculoskeletal:  Exam of the foot reveals there is no puncture wound. She has an obvious subungual hematoma at the proximal portion of the nail. This is tender to touch. There is no erythema or evidence of infection.  Otherwise, all joints had a full a ROM with no swelling, bruising or deformity.  No edema, pulses full. Extremities were warm and pink.  Capillary refill was brisk.  Skin:  Clear, warm and dry.  No rash. Neuro:  Alert and oriented times 3.  Muscle strength was normal.  Sensation was intact to light touch.   Radiology:  Dg Toe Great Right  01/14/2013   CLINICAL DATA:  Dinner fork fell on right big toe. Pain on top of right hip.  EXAM: RIGHT GREAT TOE  COMPARISON:  Foot radiographs 12/19/2011  FINDINGS: There is no evidence of fracture or dislocation. There is no evidence of arthropathy or other focal bone abnormality. Soft tissues are unremarkable.  IMPRESSION: Negative.   Electronically  Signed   By: Sebastian Ache   On: 01/14/2013 13:07   I reviewed the images independently and personally and concur with the radiologist's findings.  Course in Urgent Care Center:   The nail was prepped and using electrocautery, 2 small holes were made at the base of the nail. Some serous fluid was drained out. There was no blood. The wound was dressed.  Assessment:  The encounter diagnosis was Subungual hematoma of great toe of right foot, initial encounter.  Plan:   1.  Meds:  The following meds were prescribed:   Discharge Medication List as of 01/14/2013  1:30 PM      2.  Patient Education/Counseling:  The patient was given appropriate handouts, self care instructions, and instructed in symptomatic relief including rest and activity, elevation, application of ice and compression.  Patient was instructed to apply antibiotic ointment and keep this covered. A new nail will probably grow in.  3.  Follow up:  The patient was told to follow up if no better in 3 to 4 days, if becoming worse in any way, and given some red flag symptoms such as worsening pain or evidence of infection which would prompt immediate return.  Follow up here as necessary.       Reuben Likes, MD 01/14/13 [phone]

## 2013-01-26 ENCOUNTER — Encounter: Payer: Self-pay | Admitting: Internal Medicine

## 2013-01-26 ENCOUNTER — Ambulatory Visit (INDEPENDENT_AMBULATORY_CARE_PROVIDER_SITE_OTHER): Payer: Medicare Other | Admitting: Internal Medicine

## 2013-01-26 VITALS — BP 100/69 | HR 70 | Temp 97.8°F | Ht 69.0 in | Wt 263.2 lb

## 2013-01-26 DIAGNOSIS — S90129A Contusion of unspecified lesser toe(s) without damage to nail, initial encounter: Secondary | ICD-10-CM | POA: Diagnosis not present

## 2013-01-26 DIAGNOSIS — N898 Other specified noninflammatory disorders of vagina: Secondary | ICD-10-CM

## 2013-01-26 DIAGNOSIS — M797 Fibromyalgia: Secondary | ICD-10-CM

## 2013-01-26 DIAGNOSIS — M79674 Pain in right toe(s): Secondary | ICD-10-CM | POA: Insufficient documentation

## 2013-01-26 DIAGNOSIS — G4700 Insomnia, unspecified: Secondary | ICD-10-CM

## 2013-01-26 DIAGNOSIS — S99921S Unspecified injury of right foot, sequela: Secondary | ICD-10-CM

## 2013-01-26 DIAGNOSIS — N760 Acute vaginitis: Secondary | ICD-10-CM

## 2013-01-26 DIAGNOSIS — B9689 Other specified bacterial agents as the cause of diseases classified elsewhere: Secondary | ICD-10-CM

## 2013-01-26 DIAGNOSIS — F32A Depression, unspecified: Secondary | ICD-10-CM

## 2013-01-26 DIAGNOSIS — F329 Major depressive disorder, single episode, unspecified: Secondary | ICD-10-CM

## 2013-01-26 DIAGNOSIS — X58XXXS Exposure to other specified factors, sequela: Secondary | ICD-10-CM | POA: Diagnosis not present

## 2013-01-26 DIAGNOSIS — IMO0001 Reserved for inherently not codable concepts without codable children: Secondary | ICD-10-CM

## 2013-01-26 MED ORDER — METRONIDAZOLE 500 MG PO TABS: 2000.0000 mg | ORAL_TABLET | Freq: Once | ORAL | Status: DC

## 2013-01-26 MED ORDER — METRONIDAZOLE 0.75 % VA GEL: 1.0000 | VAGINAL | Status: AC

## 2013-01-26 NOTE — Progress Notes (Signed)
   Subjective:    Patient ID: Jordan [name], female    DOB: 09-28-1980, 32 y.o.   MRN: [id number]  HPI  Jordan [name] has hx significnat for fibromyalgia, chronic fatigue, insomnia, obesity and recurrent bacterial vaginosis who present s for post-ED visit after dropping a fork on her right big  toe.  The toe was I&Ded in the ED and patient now presents for referral to Podiatry for further management.  She denies pain, fever, or calf tenderness. States that she had a similar event happen with her left big toe many years ago when a Mason jar fell on her bare foot.  In addition, she reports that her symptoms of bacterial vaginosis have returned with watery discharge and a fishy smell.  Denies sexual intercourse, douching, recent antibiotics and states that she has followed advice to keep genital area clean, dry, and with use of cotton under garments. She is asking about alternative therapies as this seems to recur at least twice a year.  Review of Systems  Constitutional: Positive for fatigue. Negative for fever.  HENT: Negative.   Eyes: Negative.   Respiratory: Negative.   Cardiovascular: Negative.   Gastrointestinal: Negative.   Endocrine: Negative.   Genitourinary: Positive for vaginal discharge. Negative for dysuria, urgency, hematuria, flank pain and pelvic pain.  Musculoskeletal: Positive for myalgias. Negative for back pain and neck pain.  Skin: Negative.   Allergic/Immunologic: Negative.   Neurological: Negative.   Hematological: Negative.   Psychiatric/Behavioral: Positive for sleep disturbance. Negative for dysphoric mood.       Objective:   Physical Exam  Constitutional: She is oriented to person, place, and time. She appears well-developed and well-nourished. No distress.  HENT:  Head: Normocephalic and atraumatic.  Eyes: Conjunctivae and EOM are normal. Pupils are equal, round, and reactive to light.  Cardiovascular: Normal rate.   Musculoskeletal: She exhibits no edema and  no tenderness.       Right foot: She exhibits normal range of motion, no tenderness, no bony tenderness and no swelling.       Feet:  Neurological: She is alert and oriented to person, place, and time.  Skin: Skin is warm and dry.  Psychiatric: She has a normal mood and affect. Her behavior is normal. Judgment and thought content normal.          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

## 2013-01-26 NOTE — Assessment & Plan Note (Signed)
Trial Metronidazole gel twice per week for 4- 6 months

## 2013-01-26 NOTE — Assessment & Plan Note (Signed)
No dyphoric mood on Celexa and Lyrica but increased stress recently

## 2013-01-26 NOTE — Patient Instructions (Signed)
For you toe injury, a referral to Podiatry will be made for further management. Please be aware that the toe nail may fall off. For the recurrent Bacterial vaginosis, we will try long term therapy with Metronidazole vaginal gel. A week after you take the pill, start using the gel twice a week for the next 4-6 months. For the fibromyalgia, you may take two of the Lyrica in the evening while you are under more stress and do a lot of physical labor at work. Follow-up with me in 4 months.  Bacterial Vaginosis Bacterial vaginosis (BV) is a vaginal infection where the normal balance of bacteria in the vagina is disrupted. The normal balance is then replaced by an overgrowth of certain bacteria. There are several different kinds of bacteria that can cause BV. BV is the most common vaginal infection in women of childbearing age. CAUSES   The cause of BV is not fully understood. BV develops when there is an increase or imbalance of harmful bacteria.  Some activities or behaviors can upset the normal balance of bacteria in the vagina and put women at increased risk including:  Having a new sex partner or multiple sex partners.  Douching.  Using an intrauterine device (IUD) for contraception.  It is not clear what role sexual activity plays in the development of BV. However, women that have never had sexual intercourse are rarely infected with BV. Women do not get BV from toilet seats, bedding, swimming pools or from touching objects around them.  SYMPTOMS   Grey vaginal discharge.  A fish-like odor with discharge, especially after sexual intercourse.  Itching or burning of the vagina and vulva.  Burning or pain with urination.  Some women have no signs or symptoms at all. DIAGNOSIS  Your caregiver must examine the vagina for signs of BV. Your caregiver will perform lab tests and look at the sample of vaginal fluid through a microscope. They will look for bacteria and abnormal cells (clue  cells), a pH test higher than 4.5, and a positive amine test all associated with BV.  RISKS AND COMPLICATIONS   Pelvic inflammatory disease (PID).  Infections following gynecology surgery.  Developing HIV.  Developing herpes virus. TREATMENT  Sometimes BV will clear up without treatment. However, all women with symptoms of BV should be treated to avoid complications, especially if gynecology surgery is planned. Female partners generally do not need to be treated. However, BV may spread between female sex partners so treatment is helpful in preventing a recurrence of BV.   BV may be treated with antibiotics. The antibiotics come in either pill or vaginal cream forms. Either can be used with nonpregnant or pregnant women, but the recommended dosages differ. These antibiotics are not harmful to the baby.  BV can recur after treatment. If this happens, a second round of antibiotics will often be prescribed.  Treatment is important for pregnant women. If not treated, BV can cause a premature delivery, especially for a pregnant woman who had a premature birth in the past. All pregnant women who have symptoms of BV should be checked and treated.  For chronic reoccurrence of BV, treatment with a type of prescribed gel vaginally twice a week is helpful. HOME CARE INSTRUCTIONS   Finish all medication as directed by your caregiver.  Do not have sex until treatment is completed.  Tell your sexual partner that you have a vaginal infection. They should see their caregiver and be treated if they have problems, such as a mild  rash or itching.  Practice safe sex. Use condoms. Only have 1 sex partner. PREVENTION  Basic prevention steps can help reduce the risk of upsetting the natural balance of bacteria in the vagina and developing BV:  Do not have sexual intercourse (be abstinent).  Do not douche.  Use all of the medicine prescribed for treatment of BV, even if the signs and symptoms go  away.  Tell your sex partner if you have BV. That way, they can be treated, if needed, to prevent reoccurrence. SEEK MEDICAL CARE IF:   Your symptoms are not improving after 3 days of treatment.  You have increased discharge, pain, or fever. MAKE SURE YOU:   Understand these instructions.  Will watch your condition.  Will get help right away if you are not doing well or get worse. FOR MORE INFORMATION  Division of STD Prevention (DSTDP), Centers for Disease Control and Prevention: [employer] American Social Health Association (ASHA): www.ashastd.org  Document Released: 02/05/2005 Document Revised: 04/30/2011 Document Reviewed: 09/17/2012 [facility] Patient Information 2014 [place], [place].

## 2013-01-26 NOTE — Assessment & Plan Note (Signed)
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

## 2013-01-26 NOTE — Assessment & Plan Note (Signed)
Cont Lyrica and Celexa with additional 150 mg evening dose prn

## 2013-01-29 NOTE — Progress Notes (Signed)
Case discussed with Dr. Schooler at time of visit.  We reviewed the resident's history and exam and pertinent patient test results.  I agree with the assessment, diagnosis, and plan of care documented in the resident's note. 

## 2013-02-05 ENCOUNTER — Ambulatory Visit (INDEPENDENT_AMBULATORY_CARE_PROVIDER_SITE_OTHER): Payer: Medicare Other | Admitting: Podiatry

## 2013-02-05 ENCOUNTER — Encounter: Payer: Self-pay | Admitting: Podiatry

## 2013-02-05 VITALS — BP 131/76 | HR 74 | Resp 12 | Ht 69.0 in | Wt 262.0 lb

## 2013-02-05 DIAGNOSIS — S93609A Unspecified sprain of unspecified foot, initial encounter: Secondary | ICD-10-CM | POA: Diagnosis not present

## 2013-02-05 DIAGNOSIS — B351 Tinea unguium: Secondary | ICD-10-CM | POA: Diagnosis not present

## 2013-02-05 NOTE — Progress Notes (Signed)
Subjective:     Patient ID: Jordan [name], female   DOB: 03-27-1980, 32 y.o.   MRN: [id number]  HPI patient states that I thought my right big toe and it turned dark and I was worried   Review of Systems     Objective:   Physical Exam Neurovascular status intact with no health history changes noted and a darkened right hallux nail secondary to trauma with mild looseness    Assessment:     Traumatized right hallux nail secondary to trauma    Plan:     Reviewed condition and treatment options. At this point were to allow the nail to naturally fall off with instructions on soaks and if any swelling occurs or drainage to let us know.

## 2013-02-05 NOTE — Progress Notes (Signed)
N-SORE, DISCOLORATION L-RT FOOT GREAT TOENAIL D-2 WEEKS O-SLOWLY C-WORSE A-PRESSURE T-N/A

## 2013-03-30 ENCOUNTER — Ambulatory Visit (INDEPENDENT_AMBULATORY_CARE_PROVIDER_SITE_OTHER): Payer: Medicare Other | Admitting: Internal Medicine

## 2013-03-30 ENCOUNTER — Encounter: Payer: Self-pay | Admitting: Internal Medicine

## 2013-03-30 VITALS — BP 102/65 | HR 64 | Temp 97.8°F | Ht 69.0 in | Wt 261.1 lb

## 2013-03-30 DIAGNOSIS — N898 Other specified noninflammatory disorders of vagina: Secondary | ICD-10-CM | POA: Diagnosis not present

## 2013-03-30 DIAGNOSIS — IMO0001 Reserved for inherently not codable concepts without codable children: Secondary | ICD-10-CM

## 2013-03-30 DIAGNOSIS — M549 Dorsalgia, unspecified: Secondary | ICD-10-CM

## 2013-03-30 DIAGNOSIS — B354 Tinea corporis: Secondary | ICD-10-CM | POA: Diagnosis not present

## 2013-03-30 DIAGNOSIS — G4700 Insomnia, unspecified: Secondary | ICD-10-CM | POA: Diagnosis not present

## 2013-03-30 DIAGNOSIS — F329 Major depressive disorder, single episode, unspecified: Secondary | ICD-10-CM | POA: Diagnosis not present

## 2013-03-30 DIAGNOSIS — F3289 Other specified depressive episodes: Secondary | ICD-10-CM | POA: Diagnosis not present

## 2013-03-30 DIAGNOSIS — M797 Fibromyalgia: Secondary | ICD-10-CM

## 2013-03-30 NOTE — Patient Instructions (Signed)
Resume the monthly Metronidazole gel. Do not pull the toenail off, let it fall off on its own. We have referred you back to Orthopaedics for evaluation to play soft ball. Follow-up as needed or within 6-12 months.

## 2013-04-06 ENCOUNTER — Other Ambulatory Visit: Payer: Self-pay | Admitting: *Deleted

## 2013-04-06 DIAGNOSIS — M797 Fibromyalgia: Secondary | ICD-10-CM

## 2013-04-06 MED ORDER — TRAMADOL HCL 50 MG PO TABS: 50.0000 mg | ORAL_TABLET | Freq: Four times a day (QID) | ORAL | Status: DC | PRN

## 2013-04-06 NOTE — Assessment & Plan Note (Signed)
Managed with Tramadol, Improved since surgery. Requesting clearance by back surgeon to play soft ball.

## 2013-04-06 NOTE — Assessment & Plan Note (Signed)
Controlled on Lyrica

## 2013-04-06 NOTE — Assessment & Plan Note (Signed)
Cont Metronidazole

## 2013-04-06 NOTE — Progress Notes (Signed)
   Subjective:    Patient ID: Jordan [name], female    DOB: November 09, 1980, 33 y.o.   MRN: [id number]  HPI  Hx significant for fibromyalgia, obesity BMI >37, chronic back  Pain s/p back surgery and recent trauma to left great toe.  She presents for follow-up since being evaluated by a Podiatrist.  No fracture of the toe after dropping an object on it.  The toenail is darkened and she has been trying to pull it off. No pain, redness, swelling or signs of infection.  She is requesting a referral back to her back surgeon for clearance to play soft ball.  Reports improved back pain since surgery.  Managed with occasional Tramadol. Of note, she is being managed for bacterial vaginosis with weekly Metronidazole.     Review of Systems  Constitutional: Negative.   HENT: Negative.   Eyes: Negative.   Respiratory: Negative.   Cardiovascular: Negative.   Gastrointestinal: Negative.   Endocrine: Negative.   Genitourinary: Negative for dysuria, frequency and pelvic pain.  Musculoskeletal: Negative for back pain and myalgias.  Skin: Positive for color change.       Darkened left great toe nail  Neurological: Negative.   Psychiatric/Behavioral: Negative.        Objective:   Physical Exam  Constitutional: She is oriented to person, place, and time. She appears well-developed and well-nourished. No distress.  HENT:  Head: Normocephalic and atraumatic.  Eyes: Conjunctivae and EOM are normal. Pupils are equal, round, and reactive to light.  Neck: Normal range of motion. Neck supple.  Cardiovascular: Normal rate, normal heart sounds and intact distal pulses.   Pulmonary/Chest: Effort normal and breath sounds normal.  Abdominal: Soft. Bowel sounds are normal.  Musculoskeletal: Normal range of motion. She exhibits no edema and no tenderness.  Neurological: She is alert and oriented to person, place, and time.  Skin: Skin is warm and dry.  Psychiatric: She has a normal mood and affect. Her behavior is  normal. Judgment and thought content normal.          Assessment & Plan:  See separate problem based charting:

## 2013-04-08 NOTE — Telephone Encounter (Signed)
Tramadol rx called to [facility]. 

## 2013-04-10 NOTE — Progress Notes (Signed)
Case discussed with Dr. Schooler at the time of the visit.  We reviewed the resident's history and exam and pertinent patient test results.  I agree with the assessment, diagnosis, and plan of care documented in the resident's note.     

## 2013-04-13 DIAGNOSIS — M545 Low back pain, unspecified: Secondary | ICD-10-CM | POA: Diagnosis not present

## 2013-05-11 ENCOUNTER — Other Ambulatory Visit: Payer: Self-pay | Admitting: *Deleted

## 2013-05-11 DIAGNOSIS — M797 Fibromyalgia: Secondary | ICD-10-CM

## 2013-05-11 MED ORDER — PREGABALIN 150 MG PO CAPS: 150.0000 mg | ORAL_CAPSULE | Freq: Two times a day (BID) | ORAL | Status: DC

## 2013-05-11 NOTE — Telephone Encounter (Signed)
Lyrica rx called to [facility].

## 2013-05-13 ENCOUNTER — Ambulatory Visit (INDEPENDENT_AMBULATORY_CARE_PROVIDER_SITE_OTHER): Payer: Medicare Other | Admitting: Internal Medicine

## 2013-05-13 ENCOUNTER — Ambulatory Visit (INDEPENDENT_AMBULATORY_CARE_PROVIDER_SITE_OTHER): Payer: Medicare Other | Admitting: Dietician

## 2013-05-13 ENCOUNTER — Encounter: Payer: Self-pay | Admitting: Internal Medicine

## 2013-05-13 ENCOUNTER — Encounter: Payer: Self-pay | Admitting: Dietician

## 2013-05-13 VITALS — BP 99/71 | HR 69 | Temp 98.1°F | Ht 69.0 in | Wt 260.6 lb

## 2013-05-13 DIAGNOSIS — M549 Dorsalgia, unspecified: Secondary | ICD-10-CM | POA: Diagnosis not present

## 2013-05-13 DIAGNOSIS — N898 Other specified noninflammatory disorders of vagina: Secondary | ICD-10-CM | POA: Diagnosis not present

## 2013-05-13 DIAGNOSIS — G4700 Insomnia, unspecified: Secondary | ICD-10-CM | POA: Diagnosis not present

## 2013-05-13 DIAGNOSIS — F329 Major depressive disorder, single episode, unspecified: Secondary | ICD-10-CM | POA: Diagnosis not present

## 2013-05-13 DIAGNOSIS — B3731 Acute candidiasis of vulva and vagina: Secondary | ICD-10-CM

## 2013-05-13 DIAGNOSIS — B373 Candidiasis of vulva and vagina: Secondary | ICD-10-CM

## 2013-05-13 DIAGNOSIS — E669 Obesity, unspecified: Secondary | ICD-10-CM

## 2013-05-13 DIAGNOSIS — B354 Tinea corporis: Secondary | ICD-10-CM | POA: Diagnosis not present

## 2013-05-13 DIAGNOSIS — F3289 Other specified depressive episodes: Secondary | ICD-10-CM | POA: Diagnosis not present

## 2013-05-13 DIAGNOSIS — IMO0001 Reserved for inherently not codable concepts without codable children: Secondary | ICD-10-CM | POA: Diagnosis not present

## 2013-05-13 MED ORDER — FLUCONAZOLE 150 MG PO TABS: 150.0000 mg | ORAL_TABLET | Freq: Every day | ORAL | Status: DC

## 2013-05-13 NOTE — Progress Notes (Signed)
Patient Identified Concern:  Weight gain despite playing softball once weekly - reports weight cycling. (review of weight - gain of ~ 5-# in past year, 27# in 2013.) Stage of Change Patient Is In:  contemplative to preparation stage- sleeps poorly at night but did not associate this with daytime" hungriness". She has  Not been  able to decrease her weight. Eats large amounts midday when she gets real "hungry". Eats large amounts midday also when she awakes form afternoon nap Patient Reported Barriers:  Giving in to her tiredness and usual naps Patient Reported Perceived Benefits:  Less daytime sleepiness and hunger, more energy, possibly better able to manage intake and increase activity Patient Reports Self-Efficacy:   Fair to high as her back is much better Behavior Change Supports:  Family, friends from softball.  Goals:  To try to not to take naps during the day for the next two weeks (along with new increased dose of Lyrica at bedtime) to see if this helps her to sleep better at night Patient Education:  Good Sleep Hygiene discussed, handout provided Coordination of care- discussed patient plan to address poor sleep patterns and possible medication affects on weight/hunger with physician

## 2013-05-13 NOTE — Assessment & Plan Note (Signed)
White thick discharge x past 4 days.  No odor, fever, dysuria or abdominal pain.  Wearing several layers of clothing while playing softball.  -empiric fluconazole 150 mg x1

## 2013-05-13 NOTE — Assessment & Plan Note (Signed)
Would like lose more weight.  Currently 260 lbs which is down 3 lbs from 01/26/2013.  Filed Weights   05/13/13 0836  Weight: 260 lb 9.6 oz (118.207 kg)    -refer to Nutrition Educator Barry Brunner

## 2013-05-13 NOTE — Progress Notes (Signed)
   Subjective:    Patient ID: Jordan [name], female    DOB: 04-12-1980, 33 y.o.   MRN: [id number]  HPI  Pt presents with complaints of "doughy white discharge" since she started playing baseball.  Not sexually active.  No fevers, chills, odor or abdominal pain.  Hx significant for fibromyalgia, depression, chronic back pain and recurrent bacterial vaginosis (currently on extended metronidazole therapy).  States that she is now playing softball and is wearing several layers of clothing including underwear briefs, compression shorts in addition to tight uniform pants.  Review of Systems  Constitutional: Negative.   HENT: Negative.   Eyes: Negative.   Respiratory: Negative.   Cardiovascular: Negative.   Gastrointestinal: Negative.   Genitourinary: Positive for vaginal discharge. Negative for dysuria, hematuria and pelvic pain.  Musculoskeletal: Positive for myalgias.  Skin:       Bruise on left arm from where soft ball hit arm  Neurological: Negative.   Hematological: Negative.   Psychiatric/Behavioral: Negative.        Objective:   Physical Exam  Constitutional: She is oriented to person, place, and time. She appears well-developed and well-nourished. No distress.  HENT:  Head: Normocephalic and atraumatic.  Eyes: Conjunctivae and EOM are normal. Pupils are equal, round, and reactive to light.  Cardiovascular: Normal rate.   Pulmonary/Chest: Effort normal.  Abdominal: Soft. Bowel sounds are normal. There is no tenderness.  Neurological: She is alert and oriented to person, place, and time.  Skin: Skin is warm and dry.          Assessment & Plan:  See separate problem-list charting for full details:  1. Vaginal candidiasis, likely: diflucan 150 mg x1

## 2013-05-18 NOTE — Progress Notes (Signed)
Case discussed with Dr. Schooler soon after the resident saw the patient.  We reviewed the resident's history and exam and pertinent patient test results.  I agree with the assessment, diagnosis, and plan of care documented in the resident's note. 

## 2013-06-04 ENCOUNTER — Telehealth: Payer: Self-pay | Admitting: *Deleted

## 2013-06-04 NOTE — Telephone Encounter (Signed)
Returned pt's call - pt requesting another appt to see Dr Paulla Dolly at [facility] about problems w/her toes. Stated when she tried calling to schedule another appt; she was told w/Medicaid, her doctor's office needs to call. I will send message to Dr Kathline Magic nurse.

## 2013-06-10 ENCOUNTER — Encounter: Payer: Self-pay | Admitting: Podiatry

## 2013-06-10 ENCOUNTER — Ambulatory Visit (INDEPENDENT_AMBULATORY_CARE_PROVIDER_SITE_OTHER): Payer: Medicare Other

## 2013-06-10 ENCOUNTER — Ambulatory Visit (INDEPENDENT_AMBULATORY_CARE_PROVIDER_SITE_OTHER): Payer: Medicare Other | Admitting: Podiatry

## 2013-06-10 VITALS — BP 103/59 | HR 65 | Resp 15 | Ht 69.0 in | Wt 258.0 lb

## 2013-06-10 DIAGNOSIS — M2140 Flat foot [pes planus] (acquired), unspecified foot: Secondary | ICD-10-CM

## 2013-06-10 DIAGNOSIS — M722 Plantar fascial fibromatosis: Secondary | ICD-10-CM

## 2013-06-10 DIAGNOSIS — M2141 Flat foot [pes planus] (acquired), right foot: Secondary | ICD-10-CM

## 2013-06-10 DIAGNOSIS — L600 Ingrowing nail: Secondary | ICD-10-CM

## 2013-06-10 DIAGNOSIS — M2142 Flat foot [pes planus] (acquired), left foot: Principal | ICD-10-CM

## 2013-06-10 MED ORDER — TRIAMCINOLONE ACETONIDE 10 MG/ML IJ SUSP
10.0000 mg | Freq: Once | INTRAMUSCULAR | Status: AC
  Administered 2013-06-10: 10 mg

## 2013-06-10 NOTE — Patient Instructions (Signed)

## 2013-06-10 NOTE — Progress Notes (Signed)
   Subjective:    Patient ID: Jordan [name], female    DOB: Apr 06, 1980, 33 y.o.   MRN: [id number]  HPI Comments: Pt complains of B/L plantar arch pain and tightness, for 2 to 3 weeks, and has been trying massage with tennis ball without relief.  Foot Pain      Review of Systems     Objective:   Physical Exam        Assessment & Plan:

## 2013-06-11 NOTE — Progress Notes (Signed)
Subjective:     Patient ID: Jordan [name], female   DOB: December 01, 1980, 33 y.o.   MRN: [id number]  Foot Pain   patient presents stating I'm having a lot of pain in both of my arches that has been present for the last few weeks and I tried to massage it with tennis ball without relief. States that it's made it hard for her to be active and she is supposed to walk to lose weight   Review of Systems  All other systems reviewed and are negative.      Objective:   Physical Exam  Nursing note and vitals reviewed. Constitutional: She is oriented to person, place, and time.  Cardiovascular: Intact distal pulses.   Musculoskeletal: Normal range of motion.  Neurological: She is oriented to person, place, and time.  Skin: Skin is warm.   neurovascular status intact with muscle strength adequate and range of motion within normal limits. Mild equinus condition was noted and there is pain in the mid arch area of both feet with inflammation and fluid noted. Patient is able to walk but with difficulty and is noted to have normal Perfusion and arch height is lower     Assessment:     Plantar fasciitis with inflammation of the mid arch area both feet    Plan:     H&P and x-rays reviewed and injection to the mid arch area accomplished 3 mg Kenalog 5 mg Xylocaine Marcaine mixture and instructed on physical therapy and supportive insoles. Reappoint her recheck

## 2013-06-15 ENCOUNTER — Other Ambulatory Visit: Payer: Self-pay | Admitting: *Deleted

## 2013-06-15 DIAGNOSIS — M797 Fibromyalgia: Secondary | ICD-10-CM

## 2013-06-15 MED ORDER — TRAMADOL HCL 50 MG PO TABS: 50.0000 mg | ORAL_TABLET | Freq: Four times a day (QID) | ORAL | Status: DC | PRN

## 2013-06-15 NOTE — Telephone Encounter (Signed)
Opened in error. Hilda Blades Jabron Weese RN 06/15/13 11:30AM

## 2013-06-15 NOTE — Telephone Encounter (Signed)
Rx called in to pharmacy. 

## 2013-06-25 ENCOUNTER — Ambulatory Visit: Payer: Medicare Other | Admitting: Podiatry

## 2013-06-25 ENCOUNTER — Encounter: Payer: Self-pay | Admitting: Podiatry

## 2013-06-25 VITALS — BP 102/70 | HR 76 | Resp 12

## 2013-06-25 DIAGNOSIS — L600 Ingrowing nail: Secondary | ICD-10-CM | POA: Diagnosis not present

## 2013-06-25 NOTE — Progress Notes (Signed)
Subjective:     Patient ID: Jordan [name], female   DOB: [birth year], 33 y.o.   MRN: [id number]  HPI patient presents with 3 damage nails which are increasingly making it hard for her to walk comfortably she points to the big nail right and the second nail of both feet which are very thick and painful when pressed from a dorsal direction   Review of Systems     Objective:   Physical Exam Neurovascular status intact with inflammation and thickness of the hallux nail right second nail both feet with damage to the underlying nailbed of long-term duration    Assessment:     Chronic nail disease with damage take nail right and second nail both feet with pain when pressed    Plan:     Reviewed condition and discussed removal like the left hallux nail in patient states that she wants to have this done. I explained the risk of the procedure and the fact this is permanent removal of the nailbeds and she understands this and wants to surgery. I infiltrated with 180 mg Xylocaine Marcaine mixture and sterile preps to the toe and then under sterile conditions removed the hallux nail right second nails of both feet exposed matrix and applied chemical phenol 5 applications 30 seconds followed by alcohol lavaged and sterile dressing to the digits. Gave instructions on soaks and reappoint and patient will be seen back to recheck

## 2013-06-25 NOTE — Patient Instructions (Signed)

## 2013-07-24 ENCOUNTER — Emergency Department (INDEPENDENT_AMBULATORY_CARE_PROVIDER_SITE_OTHER)
Admission: EM | Admit: 2013-07-24 | Discharge: 2013-07-24 | Disposition: A | Discharge status: Home / Self Care | Payer: Medicare Other | Source: Home / Self Care | Attending: Family Medicine | Admitting: Family Medicine

## 2013-07-24 ENCOUNTER — Encounter (HOSPITAL_COMMUNITY): Payer: Self-pay | Admitting: Emergency Medicine

## 2013-07-24 DIAGNOSIS — L0390 Cellulitis, unspecified: Secondary | ICD-10-CM

## 2013-07-24 DIAGNOSIS — L0291 Cutaneous abscess, unspecified: Secondary | ICD-10-CM | POA: Diagnosis not present

## 2013-07-24 NOTE — Discharge Instructions (Signed)
Thank you for coming in today.  Abscess Care After An abscess (also called a boil or furuncle) is an infected area that contains a collection of pus. Signs and symptoms of an abscess include pain, tenderness, redness, or hardness, or you may feel a moveable soft area under your skin. An abscess can occur anywhere in the body. The infection may spread to surrounding tissues causing cellulitis. A cut (incision) by the surgeon was made over your abscess and the pus was drained out. Gauze may have been packed into the space to provide a drain that will allow the cavity to heal from the inside outwards. The boil may be painful for 5 to 7 days. Most people with a boil do not have high fevers. Your abscess, if seen early, may not have localized, and may not have been lanced. If not, another appointment may be required for this if it does not get better on its own or with medications. HOME CARE INSTRUCTIONS   Only take over-the-counter or prescription medicines for pain, discomfort, or fever as directed by your caregiver.  When you bathe, soak and then remove gauze or iodoform packs at least daily or as directed by your caregiver. You may then wash the wound gently with mild soapy water. Repack with gauze or do as your caregiver directs. SEEK IMMEDIATE MEDICAL CARE IF:   You develop increased pain, swelling, redness, drainage, or bleeding in the wound site.  You develop signs of generalized infection including muscle aches, chills, fever, or a general ill feeling.  An oral temperature above 102 F (38.9 C) develops, not controlled by medication. See your caregiver for a recheck if you develop any of the symptoms described above. If medications (antibiotics) were prescribed, take them as directed. Document Released: 08/24/2004 Document Revised: 04/30/2011 Document Reviewed: 04/21/2007 [facility] Patient Information 2014 [place].

## 2013-07-24 NOTE — ED Notes (Signed)
Pt c/o abscess on right upper back onset 3 days Reports it's getting bigger and painful Also states drainage yest Alert w/no signs of acute distress.

## 2013-07-24 NOTE — ED Provider Notes (Signed)
Jordan [name] is a 33 y.o. female who presents to Urgent Care today for abscess. Patient has had a worsening abscess of the right upper back for the past 3 days. It is moderately tender. No fevers chills nausea vomiting or diarrhea. No medications tried yet. Patient feels well otherwise.   Past Medical History  Diagnosis Date  . Basal cell carcinoma     recurrent in left maxillary, has had 5 surguries, last one 2003  . Fibroma     left ovarian  . Cyst of nasal sinus   . Candidiasis, vagina     recurrent, pt has made lifestyle modifications and none recently (as of 8/12)  . Gardnerella infection     + in 08/08  . Whitlow     hx of herpetic requiring I+D,( was bitten by autistic child that cares fr  . Allergic rhinitis     pt takes flonase for episodes, no episodes in 2012  . PLANTAR FASCIITIS, BILATERAL 05/24/2009  . Paronychia of third finger of left hand 07/28/2010  . Fibromyalgia   . Herniated disc   . Anxiety   . GERD (gastroesophageal reflux disease)     occ   History  Substance Use Topics  . Smoking status: Never Smoker   . Smokeless tobacco: Not on file  . Alcohol Use: No   ROS as above Medications: No current facility-administered medications for this encounter.   Current Outpatient Prescriptions  Medication Sig Dispense Refill  . citalopram (CELEXA) 40 MG tablet Take 1 tablet (40 mg total) by mouth daily.  30 tablet  6  . cyclobenzaprine (FLEXERIL) 5 MG tablet       . meloxicam (MOBIC) 15 MG tablet       . traMADol (ULTRAM) 50 MG tablet Take 1 tablet (50 mg total) by mouth every 6 (six) hours as needed.  30 tablet  5  . fluconazole (DIFLUCAN) 150 MG tablet Take 1 tablet (150 mg total) by mouth daily.  1 tablet  0  . HYDROcodone-acetaminophen (NORCO/VICODIN) 5-325 MG per tablet       . metroNIDAZOLE (METROGEL) 0.75 % vaginal gel Place 1 Applicatorful vaginally 2 (two) times a week. For 4 months.  70 g  11  . pregabalin (LYRICA) 150 MG capsule Take 1 capsule (150 mg  total) by mouth 2 (two) times daily. May take 2 tabs at night as needed.  90 capsule  6  . PRESCRIPTION MEDICATION Place 1 drop into both eyes 2 (two) times daily as needed. Dry eyes        Exam:  BP 108/64  Pulse 71  Temp(Src) 98.7 F (37.1 C) (Oral)  Resp 18  SpO2 100%  LMP 07/03/2013 Gen: Well NAD Skin: Small area of erythema and induration with central fluctuance right upper back. Tender to touch.  Abscess incision and drainage:  Consent obtained and timeout performed. Area cleaned with alcohol and cold spray applied. 1 mL lidocaine with epinephrine was injected in the area of fluctuance achieving good anesthesia. 11 blade scalpel was used to cut down to the area of fluctuance. The fluctuance was drained. Blunt dissection was used to break up isolations and further pus was expressed. A Band-Aid was applied over the wound. Patient tolerated procedure well.   Assessment and Plan: 33 y.o. female with abscess incision and drainage. Followup as needed  Discussed warning signs or symptoms. Please see discharge instructions. Patient expresses understanding.    Gregor Hams, MD 07/24/13 1044

## 2013-08-06 ENCOUNTER — Other Ambulatory Visit: Payer: Self-pay | Admitting: *Deleted

## 2013-08-06 DIAGNOSIS — M797 Fibromyalgia: Secondary | ICD-10-CM

## 2013-08-07 MED ORDER — TRAMADOL HCL 50 MG PO TABS: 50.0000 mg | ORAL_TABLET | Freq: Four times a day (QID) | ORAL | Status: DC | PRN

## 2013-08-10 NOTE — Telephone Encounter (Signed)
Rx called in to pharmacy. 

## 2013-08-18 ENCOUNTER — Encounter: Payer: Self-pay | Admitting: Internal Medicine

## 2013-08-24 ENCOUNTER — Encounter: Payer: Self-pay | Admitting: Internal Medicine

## 2013-08-24 ENCOUNTER — Ambulatory Visit (INDEPENDENT_AMBULATORY_CARE_PROVIDER_SITE_OTHER): Payer: Medicare Other | Admitting: Internal Medicine

## 2013-08-24 VITALS — BP 94/64 | HR 78 | Temp 97.7°F | Ht 70.5 in | Wt 262.0 lb

## 2013-08-24 DIAGNOSIS — E669 Obesity, unspecified: Secondary | ICD-10-CM | POA: Diagnosis not present

## 2013-08-24 DIAGNOSIS — F32A Depression, unspecified: Secondary | ICD-10-CM

## 2013-08-24 DIAGNOSIS — F329 Major depressive disorder, single episode, unspecified: Secondary | ICD-10-CM | POA: Diagnosis not present

## 2013-08-24 DIAGNOSIS — G8929 Other chronic pain: Secondary | ICD-10-CM

## 2013-08-24 DIAGNOSIS — IMO0001 Reserved for inherently not codable concepts without codable children: Secondary | ICD-10-CM

## 2013-08-24 DIAGNOSIS — M79609 Pain in unspecified limb: Secondary | ICD-10-CM | POA: Diagnosis not present

## 2013-08-24 DIAGNOSIS — G4700 Insomnia, unspecified: Secondary | ICD-10-CM | POA: Diagnosis not present

## 2013-08-24 DIAGNOSIS — M549 Dorsalgia, unspecified: Secondary | ICD-10-CM | POA: Diagnosis not present

## 2013-08-24 DIAGNOSIS — Z5181 Encounter for therapeutic drug level monitoring: Secondary | ICD-10-CM

## 2013-08-24 DIAGNOSIS — M797 Fibromyalgia: Secondary | ICD-10-CM

## 2013-08-24 DIAGNOSIS — F3289 Other specified depressive episodes: Secondary | ICD-10-CM

## 2013-08-24 DIAGNOSIS — N898 Other specified noninflammatory disorders of vagina: Secondary | ICD-10-CM | POA: Diagnosis not present

## 2013-08-24 DIAGNOSIS — M79674 Pain in right toe(s): Secondary | ICD-10-CM

## 2013-08-24 MED ORDER — CYCLOBENZAPRINE HCL 5 MG PO TABS: 5.0000 mg | ORAL_TABLET | Freq: Two times a day (BID) | ORAL | Status: DC | PRN

## 2013-08-24 MED ORDER — CITALOPRAM HYDROBROMIDE 40 MG PO TABS: 40.0000 mg | ORAL_TABLET | Freq: Every day | ORAL | Status: DC

## 2013-08-24 MED ORDER — TRAMADOL HCL 50 MG PO TABS: 50.0000 mg | ORAL_TABLET | Freq: Two times a day (BID) | ORAL | Status: DC | PRN

## 2013-08-24 MED ORDER — PREGABALIN 150 MG PO CAPS: 150.0000 mg | ORAL_CAPSULE | Freq: Two times a day (BID) | ORAL | Status: DC

## 2013-08-24 NOTE — Assessment & Plan Note (Addendum)
Increased pain within the last two weeks likely exacerbated by increased level of physical activity without any red flag signs or symptoms of nerve compression.   - Referral for follow-up with orthopedic surgeon per patient request.   - BMP ordered to follow renal function with meloxicam therapy. However, pt did not stop by lab, so will follow-up at next appointment.

## 2013-08-24 NOTE — Assessment & Plan Note (Signed)
Recently increased in pain within the last two weeks with some serosanguinous drainage, but no signs of infection. S/p removal of right halux nail.  - Referral for follow-up with podiatry per patient request.

## 2013-08-24 NOTE — Patient Instructions (Signed)
Avoid recreational activities that aggravate your back pain. A referral has been made to your orthopedic surgeon and your podiatrist.

## 2013-08-24 NOTE — Progress Notes (Signed)
   Subjective:    Patient ID: Jordan [name], female    DOB: [birth year], 33 y.o.   MRN: [id number]  HPI  Jordan [name] is a 33 yo female with a history of toe trauma, chronic back pain, fibromyalgia, and depression who is for two specific problems today. First, she reports that her right halux, which is s/p nail removal by podiatry last month, has been slightly tender and has been expressing serosanguinous fluid. She denies any warmth, erythema, purulence, fevers or chills. She is requesting a follow up with her podiatrist.   Second, she is reporting a two week history of recurrent back pain. She received a lumbar laminectomy in June 2014, which helped improved her symptoms in the interim. However, she says that her increased level activity with softball and jogging may have aggravated her back. She denies any pain radiation to her legs or any loss of bowel or bladder function. She is requesting a referral for follow up with her orthopedic surgeon for further evaluation.   Otherwise, she does report some increased pain that feels like burning and electric shocks in her arms, which she says is consistent with her previous fibromyalgia. However, this is in the context of her lyrica prescription running out. She is requesting a refill for her lyrica, tramadol, flexeril and citalopram.    Review of Systems  Constitutional: Negative for fever, chills, appetite change and fatigue.  HENT: Negative for rhinorrhea and sinus pressure.   Eyes: Negative for photophobia.  Respiratory: Negative for cough, shortness of breath and wheezing.   Cardiovascular: Negative for chest pain and palpitations.  Gastrointestinal: Negative for abdominal distention.  Endocrine: Negative for cold intolerance and heat intolerance.  Genitourinary: Negative for dysuria, urgency, flank pain and difficulty urinating.  Musculoskeletal: Positive for back pain ( right sided).  Skin: Positive for wound ( right halux).    Psychiatric/Behavioral: Positive for sleep disturbance ( trouble sleeping with recurrent pain).       Objective:   Physical Exam  Constitutional: She is oriented to person, place, and time. She appears well-developed and well-nourished. No distress.  HENT:  Head: Normocephalic.  Eyes: Pupils are equal, round, and reactive to light.  Neck: Normal range of motion. Neck supple. No tracheal deviation present. No thyromegaly present.  Cardiovascular: Normal rate and regular rhythm.  Exam reveals no gallop and no friction rub.   No murmur heard. Pulmonary/Chest: Effort normal and breath sounds normal. No respiratory distress. She has no wheezes. She has no rales.  Abdominal: Soft. Bowel sounds are normal. She exhibits no distension. There is no tenderness. There is no rebound.  Neurological: She is alert and oriented to person, place, and time. She has normal strength. No sensory deficit.  Lower back pain exacerbated by straight leg raise.   Skin: Skin is warm and dry.  Right halux and second toe with destructed nailbeds. Dry without erythema or exudate.       Assessment & Plan:   See separate problem-list charting for full details:

## 2013-08-24 NOTE — Assessment & Plan Note (Addendum)
Doing well with mood.   - Continue citalopram 40 mg.

## 2013-08-24 NOTE — Assessment & Plan Note (Signed)
Has previously been well controlled on the lyrica and citalopram, but had some more symptoms after running out of her lyrica.  - Refilled lyrica prescription for 150 mg BID with an additional 150 mg at night PRN.

## 2013-08-25 NOTE — Progress Notes (Signed)
I saw and evaluated the patient.  I personally confirmed the key portions of the history and exam documented by Dr. Raelene Bott and I reviewed pertinent patient test results.  The assessment, diagnosis, and plan were formulated together and I agree with the documentation in the resident's note.

## 2013-09-01 ENCOUNTER — Other Ambulatory Visit: Payer: Self-pay | Admitting: Internal Medicine

## 2013-09-07 ENCOUNTER — Encounter: Payer: Self-pay | Admitting: Podiatry

## 2013-09-07 ENCOUNTER — Ambulatory Visit (INDEPENDENT_AMBULATORY_CARE_PROVIDER_SITE_OTHER): Payer: Medicare Other | Admitting: Podiatry

## 2013-09-07 VITALS — BP 102/65 | HR 76 | Resp 16

## 2013-09-07 DIAGNOSIS — L03039 Cellulitis of unspecified toe: Secondary | ICD-10-CM | POA: Diagnosis not present

## 2013-09-09 NOTE — Addendum Note (Signed)
Addended by: Seward Meth on: 09/09/2013 07:24 PM   Modules accepted: Orders

## 2013-09-10 NOTE — Progress Notes (Signed)
Subjective:     Patient ID: Jordan [name], female   DOB: 1980-04-15, 33 y.o.   MRN: [id number]  HPI patient presents stating she is concerned about the appearance of the big toenails on the right foot with thick crusting like looked and no significant discomfort but can become irritated with shoe gear   Review of Systems     Objective:   Physical Exam Neurovascular status intact with crusted nailbeds right first and second toe with minimal drainage and no proximal edema erythema or lymph no distention and no odor noted    Assessment:     Localized process with crusted tissue which should heal fine over time    Plan:     Reviewed condition and explained usage of antibiotic ointment during the day air drying at night and allowing them to completely crossed over. If any changes were to occur she is to contact us

## 2013-10-09 DIAGNOSIS — M545 Low back pain, unspecified: Secondary | ICD-10-CM | POA: Diagnosis not present

## 2013-10-30 ENCOUNTER — Other Ambulatory Visit: Payer: Self-pay | Admitting: Internal Medicine

## 2013-11-02 NOTE — Telephone Encounter (Signed)
Patient will need a medication contract for continued tramadol at next visit.

## 2013-11-02 NOTE — Telephone Encounter (Signed)
Rx called in to pharmacy. Hilda Blades [facility] RN 11/02/13 2:15PM

## 2013-11-06 ENCOUNTER — Encounter: Payer: Self-pay | Admitting: Internal Medicine

## 2013-11-06 ENCOUNTER — Ambulatory Visit (INDEPENDENT_AMBULATORY_CARE_PROVIDER_SITE_OTHER): Payer: Medicare Other | Admitting: Internal Medicine

## 2013-11-06 VITALS — BP 104/63 | HR 80 | Temp 98.8°F | Ht 70.5 in | Wt 262.1 lb

## 2013-11-06 DIAGNOSIS — F3289 Other specified depressive episodes: Secondary | ICD-10-CM

## 2013-11-06 DIAGNOSIS — B373 Candidiasis of vulva and vagina: Secondary | ICD-10-CM | POA: Diagnosis not present

## 2013-11-06 DIAGNOSIS — F32A Depression, unspecified: Secondary | ICD-10-CM

## 2013-11-06 DIAGNOSIS — Z0000 Encounter for general adult medical examination without abnormal findings: Secondary | ICD-10-CM

## 2013-11-06 DIAGNOSIS — IMO0001 Reserved for inherently not codable concepts without codable children: Secondary | ICD-10-CM | POA: Diagnosis not present

## 2013-11-06 DIAGNOSIS — B3731 Acute candidiasis of vulva and vagina: Secondary | ICD-10-CM | POA: Diagnosis not present

## 2013-11-06 DIAGNOSIS — F329 Major depressive disorder, single episode, unspecified: Secondary | ICD-10-CM

## 2013-11-06 DIAGNOSIS — M797 Fibromyalgia: Secondary | ICD-10-CM

## 2013-11-06 DIAGNOSIS — Z23 Encounter for immunization: Secondary | ICD-10-CM

## 2013-11-06 MED ORDER — CYCLOBENZAPRINE HCL 5 MG PO TABS: 5.0000 mg | ORAL_TABLET | Freq: Two times a day (BID) | ORAL | Status: DC | PRN

## 2013-11-06 MED ORDER — FLUCONAZOLE 150 MG PO TABS: 150.0000 mg | ORAL_TABLET | Freq: Every day | ORAL | Status: DC

## 2013-11-06 MED ORDER — CYCLOBENZAPRINE HCL 5 MG PO TABS
5.0000 mg | ORAL_TABLET | Freq: Two times a day (BID) | ORAL | Status: DC | PRN
Start: 2013-11-06 — End: 2013-11-06

## 2013-11-06 NOTE — Assessment & Plan Note (Signed)
Patient states that her symptoms are well-controlled on their car, citalopram, and cyclobenzaprine  - Cyclobenzaprine refilled.

## 2013-11-06 NOTE — Assessment & Plan Note (Signed)
Flu shot today 

## 2013-11-06 NOTE — Patient Instructions (Addendum)
We have put in a refill for your cyclobenzaprine. I have also prescribed Diflucan for her vaginal symptoms. Please let us know if your symptoms do not resolve and we can see you back in clinic for further evaluation.  General Instructions:   Please bring your medicines with you each time you come to clinic.  Medicines may include prescription medications, over-the-counter medications, herbal remedies, eye drops, vitamins, or other pills.   Self Care Goals & Plans:  Self Care Goal 06/16/2012  Manage my medications take my medicines as prescribed; bring my medications to every visit; refill my medications on time  Eat healthy foods drink diet soda or water instead of juice or soda; eat more vegetables; eat foods that are low in salt; eat baked foods instead of fried foods    No flowsheet data found.   Care Management & Community Referrals:  No flowsheet data found.

## 2013-11-06 NOTE — Assessment & Plan Note (Signed)
Patient again describing recent onset of vaginal discharge. No surrounding erythema or pruritus. No odor, fever, dysuria, or abdominal pain. Patient declining pelvic exam, saying that it is similar to her previous symptoms and were successfully treated with Diflucan. Patient is requesting another course of treatment.  - Fluconazole 150 mg x1

## 2013-11-06 NOTE — Progress Notes (Signed)
INTERNAL MEDICINE TEACHING ATTENDING ADDENDUM - Aldine Contes, MD: I personally saw and evaluated Ms. Roulston in this clinic visit in conjunction with the resident, Dr. Raelene Bott. I have discussed patient's plan of care with medical resident during this visit. I have confirmed the physical exam findings and have read and agree with the clinic note including the plan with the following addition: - would give fluconazole for likely candidal yeast infection - Pt instructed to follow up if symptoms persist

## 2013-11-06 NOTE — Progress Notes (Signed)
   Subjective:    Patient ID: Jordan [name], female    DOB: 1980/11/26, 33 y.o.   MRN: [id number]  HPI  Jordan [name] is a 33 year old female with a history of fibromyalgia, depression, GERD, chronic back pain who presents to clinic for refill of her cyclobenzaprine and is requesting Diflucan.   Please refer to separate problem-list charting for more details.   Review of Systems  Constitutional: Negative for fever and chills.  HENT: Negative for rhinorrhea.   Respiratory: Negative for cough and shortness of breath.   Cardiovascular: Negative for chest pain.  Gastrointestinal: Negative for nausea, vomiting, abdominal pain, diarrhea and constipation.  Genitourinary: Positive for vaginal discharge. Negative for dysuria and hematuria.  Musculoskeletal: Positive for back pain.  Skin: Negative for rash.  Neurological: Negative for syncope.       Objective:   Physical Exam  Constitutional: She is oriented to person, place, and time. She appears well-developed and well-nourished. No distress.  HENT:  Head: Normocephalic and atraumatic.  Eyes: EOM are normal. Pupils are equal, round, and reactive to light.  Neck: Normal range of motion. Neck supple. No thyromegaly present.  Cardiovascular: Normal rate and regular rhythm.  Exam reveals no gallop and no friction rub.   No murmur heard. Pulmonary/Chest: She has no wheezes. She has no rales.  Abdominal: Soft. Bowel sounds are normal. There is no tenderness. There is no rebound and no guarding.  Genitourinary:  Pelvic exam declined.  Musculoskeletal: Normal range of motion. She exhibits no edema.  Neurological: She is alert and oriented to person, place, and time.  Skin: No rash noted.      Assessment & Plan:  Please refer to separate problem-list charting for more details.

## 2013-11-06 NOTE — Assessment & Plan Note (Signed)
Patient continues to well with mood.  - Continue citalopram 40 mg

## 2013-11-20 DIAGNOSIS — M545 Low back pain: Secondary | ICD-10-CM | POA: Diagnosis not present

## 2013-11-27 ENCOUNTER — Ambulatory Visit: Payer: Medicare Other | Attending: Orthopedic Surgery

## 2013-11-27 DIAGNOSIS — Z5189 Encounter for other specified aftercare: Secondary | ICD-10-CM | POA: Insufficient documentation

## 2013-11-27 DIAGNOSIS — R293 Abnormal posture: Secondary | ICD-10-CM | POA: Insufficient documentation

## 2013-11-27 DIAGNOSIS — M797 Fibromyalgia: Secondary | ICD-10-CM | POA: Diagnosis not present

## 2013-11-27 DIAGNOSIS — M545 Low back pain: Secondary | ICD-10-CM | POA: Diagnosis not present

## 2013-11-27 DIAGNOSIS — M6281 Muscle weakness (generalized): Secondary | ICD-10-CM | POA: Diagnosis not present

## 2013-11-30 ENCOUNTER — Ambulatory Visit: Payer: Medicare Other

## 2013-12-03 ENCOUNTER — Ambulatory Visit: Payer: Medicare Other

## 2013-12-03 DIAGNOSIS — M6281 Muscle weakness (generalized): Secondary | ICD-10-CM | POA: Diagnosis not present

## 2013-12-03 DIAGNOSIS — R293 Abnormal posture: Secondary | ICD-10-CM | POA: Diagnosis not present

## 2013-12-03 DIAGNOSIS — M545 Low back pain: Secondary | ICD-10-CM | POA: Diagnosis not present

## 2013-12-03 DIAGNOSIS — M797 Fibromyalgia: Secondary | ICD-10-CM | POA: Diagnosis not present

## 2013-12-03 DIAGNOSIS — Z5189 Encounter for other specified aftercare: Secondary | ICD-10-CM | POA: Diagnosis not present

## 2013-12-08 ENCOUNTER — Ambulatory Visit: Payer: Medicare Other | Admitting: Rehabilitation

## 2013-12-08 DIAGNOSIS — M6281 Muscle weakness (generalized): Secondary | ICD-10-CM | POA: Diagnosis not present

## 2013-12-08 DIAGNOSIS — M545 Low back pain: Secondary | ICD-10-CM | POA: Diagnosis not present

## 2013-12-08 DIAGNOSIS — M797 Fibromyalgia: Secondary | ICD-10-CM | POA: Diagnosis not present

## 2013-12-08 DIAGNOSIS — R293 Abnormal posture: Secondary | ICD-10-CM | POA: Diagnosis not present

## 2013-12-08 DIAGNOSIS — Z5189 Encounter for other specified aftercare: Secondary | ICD-10-CM | POA: Diagnosis not present

## 2013-12-10 ENCOUNTER — Ambulatory Visit: Payer: Medicare Other

## 2013-12-14 ENCOUNTER — Encounter: Payer: Medicare Other | Admitting: Internal Medicine

## 2013-12-14 ENCOUNTER — Encounter: Payer: Self-pay | Admitting: Internal Medicine

## 2013-12-14 ENCOUNTER — Other Ambulatory Visit: Payer: Self-pay | Admitting: Internal Medicine

## 2013-12-15 ENCOUNTER — Encounter: Payer: Medicare Other | Admitting: Rehabilitation

## 2013-12-15 ENCOUNTER — Encounter: Payer: Self-pay | Admitting: Internal Medicine

## 2013-12-15 ENCOUNTER — Ambulatory Visit (INDEPENDENT_AMBULATORY_CARE_PROVIDER_SITE_OTHER): Payer: Medicare Other | Admitting: Internal Medicine

## 2013-12-15 VITALS — BP 106/75 | HR 71 | Temp 98.2°F | Ht 70.0 in | Wt 263.4 lb

## 2013-12-15 DIAGNOSIS — Z9189 Other specified personal risk factors, not elsewhere classified: Secondary | ICD-10-CM

## 2013-12-15 DIAGNOSIS — Z0000 Encounter for general adult medical examination without abnormal findings: Secondary | ICD-10-CM | POA: Diagnosis not present

## 2013-12-15 DIAGNOSIS — M797 Fibromyalgia: Secondary | ICD-10-CM

## 2013-12-15 DIAGNOSIS — E669 Obesity, unspecified: Secondary | ICD-10-CM | POA: Diagnosis not present

## 2013-12-15 DIAGNOSIS — Z789 Other specified health status: Secondary | ICD-10-CM

## 2013-12-15 LAB — POCT GLYCOSYLATED HEMOGLOBIN (HGB A1C): Hemoglobin A1C: 5.6

## 2013-12-15 LAB — GLUCOSE, CAPILLARY: Glucose-Capillary: 88 mg/dL (ref 70–99)

## 2013-12-15 NOTE — Assessment & Plan Note (Signed)
-  check HA1c  

## 2013-12-15 NOTE — Assessment & Plan Note (Addendum)
Recently got a job at [employer] and has difficulty with weight loss.  Also impacts her fibromyalgia.  -referral to [facility]

## 2013-12-15 NOTE — Progress Notes (Signed)
Patient ID: Jordan [name], female   DOB: Apr 04, 1980, 33 y.o.   MRN: [id number]    Subjective:   Patient ID: Jordan [name] female    DOB: Nov 29, 1980 33 y.o.    MRN: [id number] Health Maintenance Due: There are no preventive care reminders to display for this patient.  _________________________________________________  HPI: [name] is a 33 y.o. female here for an acute visit.  Pt has a PMH outlined below.  Please see problem-based charting assessment and plan note for further details of medical issues addressed at today's visit.  PMH: Past Medical History  Diagnosis Date  . Basal cell carcinoma     recurrent in left maxillary, has had 5 surguries, last one 2003  . Fibroma     left ovarian  . Cyst of nasal sinus   . Candidiasis, vagina     recurrent, pt has made lifestyle modifications and none recently (as of 8/12)  . Gardnerella infection     + in 08/08  . Whitlow     hx of herpetic requiring I+D,( was bitten by autistic child that cares fr  . Allergic rhinitis     pt takes flonase for episodes, no episodes in 2012  . PLANTAR FASCIITIS, BILATERAL 05/24/2009  . Paronychia of third finger of left hand 07/28/2010  . Fibromyalgia   . Herniated disc   . Anxiety   . GERD (gastroesophageal reflux disease)     occ  . OVARIAN CYSTECTOMY, HX OF 03/01/2006    ovarian fibroma-left 1996, L ovary and fallopian tube removed      Medications: Current Outpatient Prescriptions on File Prior to Visit  Medication Sig Dispense Refill  . citalopram (CELEXA) 40 MG tablet Take 1 tablet (40 mg total) by mouth daily.  30 tablet  6  . cyclobenzaprine (FLEXERIL) 5 MG tablet Take 1 tablet (5 mg total) by mouth 2 (two) times daily as needed for muscle spasms.  60 tablet  6  . pregabalin (LYRICA) 150 MG capsule Take 1 capsule (150 mg total) by mouth 2 (two) times daily. May take 2 tabs at night as needed.  90 capsule  3  . PRESCRIPTION MEDICATION Place 1 drop into both eyes 2 (two) times  daily as needed. Dry eyes      . traMADol (ULTRAM) 50 MG tablet take 1 tablet by mouth every 12 hours if needed for pain  60 tablet  0   No current facility-administered medications on file prior to visit.    Allergies: No Known Allergies  FH: Family History  Problem Relation Age of Onset  . Stomach cancer      uncle  . Breast cancer      two aunts  . Cancer      unknown grandfather  . Diabetes      grandmother, aunt and 2 uncles  . Diabetes Maternal Grandmother     SH: History   Social History  . Marital Status: Single    Spouse Name: N/A    Number of Children: N/A  . Years of Education: N/A   Occupational History  . unemployed   . disability    Social History Main Topics  . Smoking status: Never Smoker   . Smokeless tobacco: None  . Alcohol Use: No  . Drug Use: No  . Sexual Activity: None   Other Topics Concern  . None   Social History Narrative   Lives in [place] in house with partner    Review of Systems:  Constitutional: Negative for fever, chills and weight loss.  Eyes: Negative for blurred vision.  Respiratory: Negative for cough and shortness of breath.  Cardiovascular: Negative for chest pain, palpitations and leg swelling.  Gastrointestinal: Negative for nausea, vomiting, abdominal pain, diarrhea, constipation and blood in stool.  Genitourinary: Negative for dysuria, urgency and frequency.  Musculoskeletal: Negative for myalgias and back pain.  Neurological: Negative for dizziness, weakness and headaches.     Objective:   Vital Signs: Filed Vitals:   12/15/13 0834 12/15/13 0840  BP:  106/75  Pulse:  71  Temp:  98.2 F (36.8 C)  TempSrc:  Oral  Height: 5\' 10"  (1.778 m)   Weight: 263 lb 6.4 oz (119.477 kg)   SpO2:  100%      BP Readings from Last 3 Encounters:  12/15/13 106/75  11/06/13 104/63  09/07/13 102/65    Physical Exam: Constitutional: Vital signs reviewed.  Patient is well-developed and well-nourished in NAD and  cooperative with exam.  Head: Normocephalic and atraumatic. Eyes: PERRL, EOMI, conjunctivae nl, no scleral icterus.  Neck: Supple. Cardiovascular: RRR, no MRG. Pulmonary/Chest: normal effort, non-tender to palpation, CTAB, no wheezes, rales, or rhonchi. Abdominal: Soft. NT/ND +BS. Musculoskeletal: Full range ofmotion. no pain,edema,or deformity.  Nocyanosis,clubbing,oredema. Neurological: A&O x3, cranial nerves II-XII are grossly intact, moving all extremities. Extremities: 2+DP b/l; no pitting edema. Skin: Warm, dry and intact. No rash.   Assessment & Plan:   Assessment and plan was discussed and formulated with my attending.

## 2013-12-15 NOTE — Progress Notes (Signed)
Patient ID: Jordan [name], female   DOB: Dec 05, 1980, 33 y.o.   MRN: [id number]    Subjective:   Patient ID: Jordan [name] female    DOB: 1981/02/11 33 y.o.    MRN: [id number] Health Maintenance Due: There are no preventive care reminders to display for this patient.  _________________________________________________  HPI: Jordan [name] is a 33 y.o. female here for a routine visit.  Pt has a PMH outlined below.  Please see problem-based charting assessment and plan note for further details of medical issues addressed at today's visit.  PMH: Past Medical History  Diagnosis Date  . Basal cell carcinoma     recurrent in left maxillary, has had 5 surguries, last one 2003  . Fibroma     left ovarian  . Cyst of nasal sinus   . Candidiasis, vagina     recurrent, pt has made lifestyle modifications and none recently (as of 8/12)  . Gardnerella infection     + in 08/08  . Whitlow     hx of herpetic requiring I+D,( was bitten by autistic child that cares fr  . Allergic rhinitis     pt takes flonase for episodes, no episodes in 2012  . PLANTAR FASCIITIS, BILATERAL 05/24/2009  . Paronychia of third finger of left hand 07/28/2010  . Fibromyalgia   . Herniated disc   . Anxiety   . GERD (gastroesophageal reflux disease)     occ  . OVARIAN CYSTECTOMY, HX OF 03/01/2006    ovarian fibroma-left 1996, L ovary and fallopian tube removed      Medications: Current Outpatient Prescriptions on File Prior to Visit  Medication Sig Dispense Refill  . citalopram (CELEXA) 40 MG tablet Take 1 tablet (40 mg total) by mouth daily.  30 tablet  6  . cyclobenzaprine (FLEXERIL) 5 MG tablet Take 1 tablet (5 mg total) by mouth 2 (two) times daily as needed for muscle spasms.  60 tablet  6  . pregabalin (LYRICA) 150 MG capsule Take 1 capsule (150 mg total) by mouth 2 (two) times daily. May take 2 tabs at night as needed.  90 capsule  3  . PRESCRIPTION MEDICATION Place 1 drop into both eyes 2 (two) times  daily as needed. Dry eyes      . traMADol (ULTRAM) 50 MG tablet take 1 tablet by mouth every 12 hours if needed for pain  60 tablet  0   No current facility-administered medications on file prior to visit.    Allergies: No Known Allergies  FH: Family History  Problem Relation Age of Onset  . Stomach cancer      uncle  . Breast cancer      two aunts  . Cancer      unknown grandfather  . Diabetes      grandmother, aunt and 2 uncles  . Diabetes Maternal Grandmother     SH: History   Social History  . Marital Status: Single    Spouse Name: N/A    Number of Children: N/A  . Years of Education: N/A   Occupational History  . unemployed   . disability    Social History Main Topics  . Smoking status: Never Smoker   . Smokeless tobacco: None  . Alcohol Use: No  . Drug Use: No  . Sexual Activity: None   Other Topics Concern  . None   Social History Narrative   Lives in [place] in house with partner    Review of Systems:  Constitutional: Negative for fever, chills and weight loss.  Eyes: Negative for blurred vision.  Respiratory: Negative for cough and shortness of breath.  Cardiovascular: Negative for chest pain, palpitations and leg swelling.  Gastrointestinal: Negative for nausea, vomiting, abdominal pain, diarrhea, constipation and blood in stool.  Genitourinary: Negative for dysuria, urgency and frequency.  Musculoskeletal: +myalgias and back pain.  Neurological: Negative for dizziness, weakness and headaches.     Objective:   Vital Signs: Filed Vitals:   12/15/13 0834 12/15/13 0840  BP:  106/75  Pulse:  71  Temp:  98.2 F (36.8 C)  TempSrc:  Oral  Height: 5\' 10"  (1.778 m)   Weight: 263 lb 6.4 oz (119.477 kg)   SpO2:  100%      BP Readings from Last 3 Encounters:  12/15/13 106/75  11/06/13 104/63  09/07/13 102/65    Physical Exam: Constitutional: Vital signs reviewed.  Patient is well-developed and well-nourished in NAD and cooperative  with exam.  Head: Normocephalic and atraumatic. Eyes: PERRL, EOMI, conjunctivae nl, no scleral icterus.  Neck: Supple. Cardiovascular: RRR, no MRG. Pulmonary/Chest: normal effort, non-tender to palpation, CTAB, no wheezes, rales, or rhonchi. Abdominal: Soft. NT/ND +BS. Neurological: A&O x3, cranial nerves II-XII are grossly intact, moving all extremities. Extremities: 2+DP b/l; no pitting edema. Skin: Warm, dry and intact. No rash.   Assessment & Plan:   Assessment and plan was discussed and formulated with my attending.

## 2013-12-15 NOTE — Assessment & Plan Note (Addendum)
Pt reports pain "all over" that she states is "having a flare."  She has been using lyrica, citalopram, and flexeril with some relief.  Is going to PT for her chronic back pain which will hopefully also help with her fibromyalgia.  She has never had her VitD level checked.  Pt had a very flat affect and I discussed counseling with her which she has gone to in the past but not recently.  -continue PT and medications -referral to social work for counseling and also [employer], [employer] for weight loss and life coaching  -advised to take 1000 units of Vit D daily  -return in 6 months or less if having problems

## 2013-12-15 NOTE — Patient Instructions (Signed)
Thank you for your visit today.   Please return to the internal medicine clinic in 6 month(s) or sooner if needed.   I will refer you to [facility] for help with weight loss. I will also refer you to our social worker, Golden Hurter who will be able to help you get into some counseling.   Your current medical regimen is effective;  continue present plan and take all medications as prescribed.    Please be sure to bring all of your medications with you to every visit; this includes herbal supplements, vitamins, eye drops, and any over-the-counter medications.   Should you have any questions regarding your medications and/or any new or worsening symptoms, please be sure to call the clinic at [phone].   If you believe that you are suffering from a life threatening condition or one that may result in the loss of limb or function, then you should call 911 or proceed to the nearest Emergency Department.     A healthy lifestyle and preventative care can promote health and wellness.   Maintain regular health, dental, and eye exams.  Eat a healthy diet. Foods like vegetables, fruits, whole grains, low-fat dairy products, and lean protein foods contain the nutrients you need without too many calories. Decrease your intake of foods high in solid fats, added sugars, and salt. Get information about a proper diet from your caregiver, if necessary.  Regular physical exercise is one of the most important things you can do for your health. Most adults should get at least 150 minutes of moderate-intensity exercise (any activity that increases your heart rate and causes you to sweat) each week. In addition, most adults need muscle-strengthening exercises on 2 or more days a week.   Maintain a healthy weight. The body mass index (BMI) is a screening tool to identify possible weight problems. It provides an estimate of body fat based on height and weight. Your caregiver can help determine your BMI, and  can help you achieve or maintain a healthy weight. For adults 20 years and older:  A BMI below 18.5 is considered underweight.  A BMI of 18.5 to 24.9 is normal.  A BMI of 25 to 29.9 is considered overweight.  A BMI of 30 and above is considered obese.

## 2013-12-16 ENCOUNTER — Telehealth: Payer: Self-pay | Admitting: Licensed Clinical Social Worker

## 2013-12-16 NOTE — Telephone Encounter (Signed)
Ms. Cummings was referred to CSW for counseling referrals.  CSW placed called to pt.  CSW left message requesting return call. CSW provided contact hours and phone number.

## 2013-12-16 NOTE — Telephone Encounter (Signed)
Pt returned call to [place].  CSW discussed options for behavioral health.  Pt would like psychotherapy at this time.  CSW discussed options for scheduling or walk-in.  Pt stating she would utilize walk-in services, provided Ms. Dicioccio with information to both [facility] and [facility].  Pt requested contact information for [facility].  CSW provided information and pt made aware CSW is available to assist.

## 2013-12-18 NOTE — Progress Notes (Signed)
Internal Medicine Clinic Attending  Case discussed with Dr. Gill soon after the resident saw the patient.  We reviewed the resident's history and exam and pertinent patient test results.  I agree with the assessment, diagnosis, and plan of care documented in the resident's note.  

## 2013-12-24 ENCOUNTER — Other Ambulatory Visit (HOSPITAL_COMMUNITY)
Admission: RE | Admit: 2013-12-24 | Discharge: 2013-12-24 | Disposition: A | Discharge status: Home / Self Care | Payer: Medicare Other | Source: Ambulatory Visit | Attending: Internal Medicine | Admitting: Internal Medicine

## 2013-12-24 ENCOUNTER — Ambulatory Visit (INDEPENDENT_AMBULATORY_CARE_PROVIDER_SITE_OTHER): Payer: Medicare Other | Admitting: Internal Medicine

## 2013-12-24 ENCOUNTER — Encounter: Payer: Self-pay | Admitting: Internal Medicine

## 2013-12-24 VITALS — BP 102/64 | HR 66 | Temp 98.2°F | Ht 70.0 in

## 2013-12-24 DIAGNOSIS — Z113 Encounter for screening for infections with a predominantly sexual mode of transmission: Secondary | ICD-10-CM | POA: Diagnosis present

## 2013-12-24 DIAGNOSIS — Z124 Encounter for screening for malignant neoplasm of cervix: Secondary | ICD-10-CM | POA: Insufficient documentation

## 2013-12-24 DIAGNOSIS — R10819 Abdominal tenderness, unspecified site: Secondary | ICD-10-CM | POA: Diagnosis not present

## 2013-12-24 DIAGNOSIS — Z1151 Encounter for screening for human papillomavirus (HPV): Secondary | ICD-10-CM | POA: Insufficient documentation

## 2013-12-24 DIAGNOSIS — N730 Acute parametritis and pelvic cellulitis: Secondary | ICD-10-CM | POA: Insufficient documentation

## 2013-12-24 DIAGNOSIS — N760 Acute vaginitis: Secondary | ICD-10-CM | POA: Insufficient documentation

## 2013-12-24 LAB — POCT URINALYSIS DIPSTICK
Bilirubin, UA: NEGATIVE
Blood, UA: NEGATIVE
Glucose, UA: NEGATIVE
Ketones, UA: NEGATIVE
Leukocytes, UA: NEGATIVE
Nitrite, UA: NEGATIVE
Protein, UA: NEGATIVE
Spec Grav, UA: 1.01
Urobilinogen, UA: 0.2
pH, UA: 5.5

## 2013-12-24 LAB — POCT URINE PREGNANCY: Preg Test, Ur: NEGATIVE

## 2013-12-24 MED ORDER — CEFTRIAXONE SODIUM 1 G IJ SOLR
250.0000 mg | Freq: Once | INTRAMUSCULAR | Status: AC
  Administered 2013-12-24: 250 mg via INTRAMUSCULAR

## 2013-12-24 MED ORDER — DOXYCYCLINE HYCLATE 100 MG PO CAPS: 100.0000 mg | ORAL_CAPSULE | Freq: Two times a day (BID) | ORAL | Status: DC

## 2013-12-24 NOTE — Assessment & Plan Note (Addendum)
Suprapubic tenderness x3 days, constant, aching. Denies recent sexual encounters and urine preg negative. Pain on pelvic exam with erythematous cervix.  If work up below negative or worsening of symptoms could consider transvaginal ultrasound vs. ?nephrolithiasis. Appendicitis seems less likely at this time but should also be considreed in differential if persistent. UTI seems less likely given u/a results negative for nitrite and leukocytes. tial if persistent. UTI seems less likely given u/a results negative for nitrite and leukocytes.  -wet prep, G/C, Chlamydia, and pap smear sent today -ceftriaxone 250mg  im x1 and doxycycline 100mg  po bid x14 days -return to opc early next week for follow up, in the meantime if worsening, advised to go to the ED right away and provided clinic and after hours number to patient as well. Discussed signs of possible worsening of infection, pain, nausea, vomiting, fever, chills; would likely need admission and IV antibiotics at that time. -urine culture

## 2013-12-24 NOTE — Progress Notes (Signed)
Subjective:   Patient ID: Jordan [name] female   DOB: January 03, 1981 33 y.o.   MRN: [id number]  HPI: [name] is a 33 y.o. female with PMH as listed below presenting to opc today for acute visit with complaints of lower abdominal pain and ?UTI.   Suprapubic tenderness--x3 days, lower abdomen b/l, constant aching feeling, worse with urination and improved after urination. Endorses burning with urination on day 1 but no longer but has frequency and nocturia. Good flow but feels like when she has to void she has to go right away. Not sexually active at this time, last sex encounter 2-3 years ago. LMP 11/27/13, lasting 5 days, regular, next period expected any day now but has not started. Urine preg in clinic was negative. Denies any abnormal vaginal discharge, bleeding, or odor. No prior similar episodes. Some chills and nausea, no vomiting. Tolerated food okay. Pain is 4/10 currently. Urine dipstick negative for nitrite or leukocytes. No constipation or diarrhea. Last papsmear at least 2 years ago.   Past Medical History  Diagnosis Date  . Basal cell carcinoma     recurrent in left maxillary, has had 5 surguries, last one 2003  . Fibroma     left ovarian  . Cyst of nasal sinus   . Candidiasis, vagina     recurrent, pt has made lifestyle modifications and none recently (as of 8/12)  . Gardnerella infection     + in 08/08  . [place]     hx of herpetic requiring I+D,( was bitten by autistic child that cares fr  . Allergic rhinitis     pt takes flonase for episodes, no episodes in 2012  . PLANTAR FASCIITIS, BILATERAL 05/24/2009  . Paronychia of third finger of left hand 07/28/2010  . Fibromyalgia   . Herniated disc   . Anxiety   . GERD (gastroesophageal reflux disease)     occ  . OVARIAN CYSTECTOMY, HX OF 03/01/2006    ovarian fibroma-left 1996, L ovary and fallopian tube removed     Current Outpatient Prescriptions  Medication Sig Dispense Refill  . citalopram (CELEXA) 40 MG tablet  Take 1 tablet (40 mg total) by mouth daily. 30 tablet 6  . cyclobenzaprine (FLEXERIL) 5 MG tablet Take 1 tablet (5 mg total) by mouth 2 (two) times daily as needed for muscle spasms. 60 tablet 6  . pregabalin (LYRICA) 150 MG capsule Take 1 capsule (150 mg total) by mouth 2 (two) times daily. May take 2 tabs at night as needed. 90 capsule 3  . PRESCRIPTION MEDICATION Place 1 drop into both eyes 2 (two) times daily as needed. Dry eyes    . traMADol (ULTRAM) 50 MG tablet take 1 tablet by mouth every 12 hours if needed for pain 60 tablet 0  . doxycycline (VIBRAMYCIN) 100 MG capsule Take 1 capsule (100 mg total) by mouth 2 (two) times daily. 28 capsule 0   Current Facility-Administered Medications  Medication Dose Route Frequency Provider Last Rate Last Dose  . cefTRIAXone (ROCEPHIN) injection 250 mg  250 mg Intramuscular Once Wilber Oliphant, MD       Family History  Problem Relation Age of Onset  . Stomach cancer      uncle  . Breast cancer      two aunts  . Cancer      unknown grandfather  . Diabetes      grandmother, aunt and 2 uncles  . Diabetes Maternal Grandmother    History  Social History  . Marital Status: Single    Spouse Name: N/A    Number of Children: N/A  . Years of Education: N/A   Occupational History  . unemployed   . disability    Social History Main Topics  . Smoking status: Never Smoker   . Smokeless tobacco: None  . Alcohol Use: No  . Drug Use: No  . Sexual Activity: None   Other Topics Concern  . None   Social History Narrative   Lives in [place] in house with partner   Review of Systems:  Constitutional:  Chills  HEENT:  Denies congestion  Respiratory:  Denies SOB  Cardiovascular:  Denies chest pain  Gastrointestinal:  Denies nausea and abdominal pain  Genitourinary:  Denies dysuria at this time. Frequency and nocturia  Musculoskeletal:  Denies gait problem.   Skin:  Denies pallor, rash and wound.   Neurological:  Denies syncope    Objective:  Physical Exam: Filed Vitals:   12/24/13 0850  BP: 102/64  Pulse: 66  Temp: 98.2 F (36.8 C)  TempSrc: Oral  Height: 5\' 10"  (1.778 m)  SpO2: 100%   Vitals reviewed. General: lying on examination table, acute distress due to pain HEENT: EOMI Cardiac: RRR, no rubs, murmurs or gallops Pulm: clear to auscultation bilaterally, no wheezes, rales, or rhonchi Abd: soft, suprapubic tenderness bilaterally, tenderness with deep palpation of right groin more than left, +bs, -cva tenderness GYN: very tender on examination, speckled blood and erythema around cervical os, ?inflammation of cervix. Unable to do manual exam due to patient discomfort. No discharge or foul odor.  Ext: moving all 4 extremities Neuro: alert and oriented X3, tearful during exam  Assessment & Plan:  Discussed with Dr. Daryll Drown PID--rocephin im x1 in opc, doxycycline x14 days, close opc follow up

## 2013-12-24 NOTE — Patient Instructions (Addendum)
General Instructions:  Please bring your medicines with you each time you come to clinic.  Medicines may include prescription medications, over-the-counter medications, herbal remedies, eye drops, vitamins, or other pills. Dear Jordan [name],  Please take doxycycline antibiotics 100mg  twice a day by mouth for the next 14 days starting today! You have also received the ceftriaxone injection in clinic today. Make sure to stay upright for at least 30 minutes after taking each tablet.   If you have worsening of your pain, fever, chills, nausea, vomiting, blood in urine or vagina, abnormal discharge, you need to call us immediately or go to the emergency room. You can call the clinic [phone] or after hours pager [phone] as this may be signs of worsening infection  Return in 1 week for follow up  Progress Toward Treatment Goals:  No flowsheet data found.  Self Care Goals & Plans:  Self Care Goal 06/16/2012  Manage my medications take my medicines as prescribed; bring my medications to every visit; refill my medications on time  Eat healthy foods drink diet soda or water instead of juice or soda; eat more vegetables; eat foods that are low in salt; eat baked foods instead of fried foods    No flowsheet data found.   Care Management & Community Referrals:  No flowsheet data found.   Pelvic Inflammatory Disease Pelvic inflammatory disease (PID) is an infection in some or all of the female organs. PID can be in the uterus, ovaries, fallopian tubes, or the surrounding tissues inside the lower belly area (pelvis). HOME CARE   If given, take your antibiotic medicine as told. Finish them even if you start to feel better.  Only take medicine as told by your doctor.  Do not have sex (intercourse) until treatment is done or as told by your doctor.  Tell your sex partner if you have PID. Your partner may need to be treated.  Keep all doctor visits. GET HELP RIGHT AWAY IF:   You have a  fever.  You have more belly (abdominal) or lower belly pain.  You have chills.  You have pain when you pee (urinate).  You are not better after 72 hours.  You have more fluid (discharge) coming from your vagina or fluid that is not normal.  You need pain medicine from your doctor.  You throw up (vomit).  You cannot take your medicines.  Your partner has a sexually transmitted disease (STD). MAKE SURE YOU:   Understand these instructions.  Will watch your condition.  Will get help right away if you are not doing well or get worse. Document Released: 05/04/2008 Document Revised: 06/02/2012 Document Reviewed: 02/01/2011 [facility] Patient Information 2015 [place], [place]. This information is not intended to replace advice given to you by your health care provider. Make sure you discuss any questions you have with your health care provider.  Doxycycline tablets or capsules What is this medicine? DOXYCYCLINE (dox i SYE kleen) is a tetracycline antibiotic. It kills certain bacteria or stops their growth. It is used to treat many kinds of infections, like dental, skin, respiratory, and urinary tract infections. It also treats acne, Lyme disease, malaria, and certain sexually transmitted infections. This medicine may be used for other purposes; ask your health care provider or pharmacist if you have questions. COMMON BRAND NAME(S): Acticlate, Adoxa, Adoxa CK, Adoxa Pak, Adoxa TT, Alodox, Avidoxy, Doxal, Monodox, Morgidox 1x, Morgidox 1x Kit, Morgidox 2x, Morgidox 2x Kit, Ocudox, Vibra-Tabs, Vibramycin What should I tell my health care provider  before I take this medicine? They need to know if you have any of these conditions: -liver disease -long exposure to sunlight like working outdoors -stomach problems like colitis -an unusual or allergic reaction to doxycycline, tetracycline antibiotics, other medicines, foods, dyes, or preservatives -pregnant or trying to get  pregnant -breast-feeding How should I use this medicine? Take this medicine by mouth with a full glass of water. Follow the directions on the prescription label. It is best to take this medicine without food, but if it upsets your stomach take it with food. Take your medicine at regular intervals. Do not take your medicine more often than directed. Take all of your medicine as directed even if you think you are better. Do not skip doses or stop your medicine early. Talk to your pediatrician regarding the use of this medicine in children. Special care may be needed. While this drug may be prescribed for children as young as 33 years old for selected conditions, precautions do apply. Overdosage: If you think you have taken too much of this medicine contact a poison control center or emergency room at once. NOTE: This medicine is only for you. Do not share this medicine with others. What if I miss a dose? If you miss a dose, take it as soon as you can. If it is almost time for your next dose, take only that dose. Do not take double or extra doses. What may interact with this medicine? -antacids -barbiturates -birth control pills -bismuth subsalicylate -carbamazepine -methoxyflurane -other antibiotics -phenytoin -vitamins that contain iron -warfarin This list may not describe all possible interactions. Give your health care provider a list of all the medicines, herbs, non-prescription drugs, or dietary supplements you use. Also tell them if you smoke, drink alcohol, or use illegal drugs. Some items may interact with your medicine. What should I watch for while using this medicine? Tell your doctor or health care professional if your symptoms do not improve. Do not treat diarrhea with over the counter products. Contact your doctor if you have diarrhea that lasts more than 2 days or if it is severe and watery. Do not take this medicine just before going to bed. It may not dissolve properly when you  lay down and can cause pain in your throat. Drink plenty of fluids while taking this medicine to also help reduce irritation in your throat. This medicine can make you more sensitive to the sun. Keep out of the sun. If you cannot avoid being in the sun, wear protective clothing and use sunscreen. Do not use sun lamps or tanning beds/booths. Birth control pills may not work properly while you are taking this medicine. Talk to your doctor about using an extra method of birth control. If you are being treated for a sexually transmitted infection, avoid sexual contact until you have finished your treatment. Your sexual partner may also need treatment. Avoid antacids, aluminum, calcium, magnesium, and iron products for 4 hours before and 2 hours after taking a dose of this medicine. If you are using this medicine to prevent malaria, you should still protect yourself from contact with mosquitos. Stay in screened-in areas, use mosquito nets, keep your body covered, and use an insect repellent. What side effects may I notice from receiving this medicine? Side effects that you should report to your doctor or health care professional as soon as possible: -allergic reactions like skin rash, itching or hives, swelling of the face, lips, or tongue -difficulty breathing -fever -itching in the rectal  or genital area -pain on swallowing -redness, blistering, peeling or loosening of the skin, including inside the mouth -severe stomach pain or cramps -unusual bleeding or bruising -unusually weak or tired -yellowing of the eyes or skin Side effects that usually do not require medical attention (report to your doctor or health care professional if they continue or are bothersome): -diarrhea -loss of appetite -nausea, vomiting This list may not describe all possible side effects. Call your doctor for medical advice about side effects. You may report side effects to FDA at 1-800-FDA-1088. Where should I keep my  medicine? Keep out of the reach of children. Store at room temperature, below 30 degrees C (86 degrees F). Protect from light. Keep container tightly closed. Throw away any unused medicine after the expiration date. Taking this medicine after the expiration date can make you seriously ill. NOTE: This sheet is a summary. It may not cover all possible information. If you have questions about this medicine, talk to your doctor, pharmacist, or health care provider.  2015, [employer] Standard. (2012-12-12 13:58:06)  Ceftriaxone injection What is this medicine? CEFTRIAXONE (sef try AX one) is a cephalosporin antibiotic. It is used to treat certain kinds of bacterial infections. It will not work for colds, flu, or other viral infections. This medicine may be used for other purposes; ask your health care provider or pharmacist if you have questions. COMMON BRAND NAME(S): Rocephin What should I tell my health care provider before I take this medicine? They need to know if you have any of these conditions: -any chronic illness -bowel disease, like colitis -both kidney and liver disease -high bilirubin level in newborn patients -an unusual or allergic reaction to ceftriaxone, other cephalosporin or penicillin antibiotics, foods, dyes or preservatives -pregnant or trying to get pregnant -breast-feeding How should I use this medicine? This medicine is injected into a muscle or infused it into a vein. It is usually given in a medical office or clinic. If you are to give this medicine you will be taught how to inject it. Follow instructions carefully. Use your doses at regular intervals. Do not take your medicine more often than directed. Do not skip doses or stop your medicine early even if you feel better. Do not stop taking except on your doctor's advice. Talk to your pediatrician regarding the use of this medicine in children. Special care may be needed. Overdosage: If you think you have taken too  much of this medicine contact a poison control center or emergency room at once. NOTE: This medicine is only for you. Do not share this medicine with others. What if I miss a dose? If you miss a dose, take it as soon as you can. If it is almost time for your next dose, take only that dose. Do not take double or extra doses. What may interact with this medicine? Do not take this medicine with any of the following medications: -intravenous calcium This medicine may also interact with the following medications: -birth control pills This list may not describe all possible interactions. Give your health care provider a list of all the medicines, herbs, non-prescription drugs, or dietary supplements you use. Also tell them if you smoke, drink alcohol, or use illegal drugs. Some items may interact with your medicine. What should I watch for while using this medicine? Tell your doctor or health care professional if your symptoms do not improve or if they get worse. Do not treat diarrhea with over the counter products. Contact your doctor if  you have diarrhea that lasts more than 2 days or if it is severe and watery. If you are being treated for a sexually transmitted disease, avoid sexual contact until you have finished your treatment. Having sex can infect your sexual partner. Calcium may bind to this medicine and cause lung or kidney problems. Avoid calcium products while taking this medicine and for 48 hours after taking the last dose of this medicine. What side effects may I notice from receiving this medicine? Side effects that you should report to your doctor or health care professional as soon as possible: -allergic reactions like skin rash, itching or hives, swelling of the face, lips, or tongue -breathing problems -fever, chills -irregular heartbeat -pain when passing urine -seizures -stomach pain, cramps -unusual bleeding, bruising -unusually weak or tired Side effects that usually do not  require medical attention (report to your doctor or health care professional if they continue or are bothersome): -diarrhea -dizzy, drowsy -headache -nausea, vomiting -pain, swelling, irritation where injected -stomach upset -sweating This list may not describe all possible side effects. Call your doctor for medical advice about side effects. You may report side effects to FDA at 1-800-FDA-1088. Where should I keep my medicine? Keep out of the reach of children. Store at room temperature below 25 degrees C (77 degrees F). Protect from light. Throw away any unused vials after the expiration date. NOTE: This sheet is a summary. It may not cover all possible information. If you have questions about this medicine, talk to your doctor, pharmacist, or health care provider.  2015, [employer]vier/[employer] Standard. (2012-09-11 15:59:53)

## 2013-12-25 ENCOUNTER — Telehealth: Payer: Self-pay | Admitting: *Deleted

## 2013-12-25 ENCOUNTER — Encounter (HOSPITAL_COMMUNITY): Payer: Self-pay | Admitting: *Deleted

## 2013-12-25 ENCOUNTER — Inpatient Hospital Stay (HOSPITAL_COMMUNITY)
Admission: AD | Admit: 2013-12-25 | Discharge: 2013-12-25 | Disposition: A | Discharge status: Home / Self Care | Payer: Medicare Other | Source: Ambulatory Visit | Attending: Obstetrics & Gynecology | Admitting: Obstetrics & Gynecology

## 2013-12-25 DIAGNOSIS — L02415 Cutaneous abscess of right lower limb: Secondary | ICD-10-CM | POA: Diagnosis not present

## 2013-12-25 DIAGNOSIS — L0390 Cellulitis, unspecified: Secondary | ICD-10-CM

## 2013-12-25 DIAGNOSIS — L03115 Cellulitis of right lower limb: Secondary | ICD-10-CM | POA: Insufficient documentation

## 2013-12-25 DIAGNOSIS — L0291 Cutaneous abscess, unspecified: Secondary | ICD-10-CM

## 2013-12-25 LAB — CERVICOVAGINAL ANCILLARY ONLY
Chlamydia: NEGATIVE
Neisseria Gonorrhea: NEGATIVE
Wet Prep (BD Affirm): NEGATIVE
Wet Prep (BD Affirm): NEGATIVE
Wet Prep (BD Affirm): NEGATIVE

## 2013-12-25 LAB — URINALYSIS, ROUTINE W REFLEX MICROSCOPIC
Bilirubin Urine: NEGATIVE
Glucose, UA: NEGATIVE mg/dL
Hgb urine dipstick: NEGATIVE
Ketones, ur: NEGATIVE mg/dL
Leukocytes, UA: NEGATIVE
Nitrite: NEGATIVE
Protein, ur: NEGATIVE mg/dL
Specific Gravity, Urine: 1.02 (ref 1.005–1.030)
Urobilinogen, UA: 0.2 mg/dL (ref 0.0–1.0)
pH: 7 (ref 5.0–8.0)

## 2013-12-25 LAB — CYTOLOGY - PAP

## 2013-12-25 LAB — POCT PREGNANCY, URINE: Preg Test, Ur: NEGATIVE

## 2013-12-25 MED ORDER — SULFAMETHOXAZOLE-TRIMETHOPRIM 800-160 MG PO TABS: 1.0000 | ORAL_TABLET | Freq: Two times a day (BID) | ORAL | Status: AC

## 2013-12-25 NOTE — Telephone Encounter (Signed)
Pt calls and states she feels worse today, fevers(although she does not know what her temp is), chills, body aches "all over", just feels worse. She is advised to go to [facility] and be evaluated

## 2013-12-25 NOTE — Telephone Encounter (Signed)
Thank you Helen. I agree with your plan 

## 2013-12-25 NOTE — Discharge Instructions (Signed)
Abscess An abscess (boil or furuncle) is an infected area on or under the skin. This area is filled with yellowish-white fluid (pus) and other material (debris). HOME CARE   Only take medicines as told by your doctor.  If you were given antibiotic medicine, take it as directed. Finish the medicine even if you start to feel better.  If gauze is used, follow your doctor's directions for changing the gauze.  To avoid spreading the infection:  Keep your abscess covered with a bandage.  Wash your hands well.  Do not share personal care items, towels, or whirlpools with others.  Avoid skin contact with others.  Keep your skin and clothes clean around the abscess.  Keep all doctor visits as told. GET HELP RIGHT AWAY IF:   You have more pain, puffiness (swelling), or redness in the wound site.  You have more fluid or blood coming from the wound site.  You have muscle aches, chills, or you feel sick.  You have a fever. MAKE SURE YOU:   Understand these instructions.  Will watch your condition.  Will get help right away if you are not doing well or get worse. Document Released: 07/25/2007 Document Revised: 08/07/2011 Document Reviewed: 04/20/2011 [facility] Patient Information 2015 [place], [place]. This information is not intended to replace advice given to you by your health care provider. Make sure you discuss any questions you have with your health care provider.  Cellulitis Cellulitis is an infection of the skin and the tissue under the skin. The infected area is usually red and tender. This happens most often in the arms and lower legs. HOME CARE   Take your antibiotic medicine as told. Finish the medicine even if you start to feel better.  Keep the infected arm or leg raised (elevated).  Put a warm cloth on the area up to 4 times per day.  Only take medicines as told by your doctor.  Keep all doctor visits as told. GET HELP IF:  You see red streaks on the skin  coming from the infected area.  Your red area gets bigger or turns a dark color.  Your bone or joint under the infected area is painful after the skin heals.  Your infection comes back in the same area or different area.  You have a puffy (swollen) bump in the infected area.  You have new symptoms.  You have a fever. GET HELP RIGHT AWAY IF:   You feel very sleepy.  You throw up (vomit) or have watery poop (diarrhea).  You feel sick and have muscle aches and pains. MAKE SURE YOU:   Understand these instructions.  Will watch your condition.  Will get help right away if you are not doing well or get worse. Document Released: 07/25/2007 Document Revised: 06/22/2013 Document Reviewed: 04/23/2011 [facility] Patient Information 2015 [place], [place]. This information is not intended to replace advice given to you by your health care provider. Make sure you discuss any questions you have with your health care provider.

## 2013-12-25 NOTE — MAU Note (Addendum)
Pt was seen at [facility] outpatient clinic with Dr. Gordy Levan.  She was Dx. With PID.  Pt started antibiotics yesterday.  Today she feels horrible, hot and cold and "my head is swimming".  Abd pain is better today but still hurts some.  She just wanted to be checked out. Pt also has a circular sore on rt leg below Knee with redness around the area.  Pt states it hurts just to have her clothes touching it.

## 2013-12-25 NOTE — MAU Provider Note (Signed)
History     CSN: [id number]  Arrival date and time: 12/25/13 1517   First Provider Initiated Contact with Patient 12/25/13 1601      No chief complaint on file.  HPI  [name] is a 33 y.o. [id number] who presents to MAU today with complaint of generalized body aches. The patient was diagnosed yesterday with PID at [facility] and given Rocephin and started on Doxycycline. The patient states that she has had nausea, but no vomiting or diarrhea. She states that her pelvic pain has improved. She is unsure about fever, but does endorse chills. She denies vaginal bleeding, discharge, UTI symptoms or constipation. Patient is also concerned about a "spot" on her right leg that is painful.   OB History    Gravida Para Term Preterm AB TAB SAB Ectopic Multiple Living   0 0 0 0 0 0 0 0 0 0       Past Medical History  Diagnosis Date  . Basal cell carcinoma     recurrent in left maxillary, has had 5 surguries, last one 2003  . Fibroma     left ovarian  . Cyst of nasal sinus   . Candidiasis, vagina     recurrent, pt has made lifestyle modifications and none recently (as of 8/12)  . Gardnerella infection     + in 08/08  . [place]     hx of herpetic requiring I+D,( was bitten by autistic child that cares fr  . Allergic rhinitis     pt takes flonase for episodes, no episodes in 2012  . PLANTAR FASCIITIS, BILATERAL 05/24/2009  . Paronychia of third finger of left hand 07/28/2010  . Fibromyalgia   . Herniated disc   . Anxiety   . GERD (gastroesophageal reflux disease)     occ  . OVARIAN CYSTECTOMY, HX OF 03/01/2006    ovarian fibroma-left 1996, L ovary and fallopian tube removed      Past Surgical History  Procedure Laterality Date  . Left oophorectomy    . Cholecystectomy    . Nasal sinus surgery      X 2 at age 39 & 51  . Mandible reconstruction  89    upper anfd lower jaw cyst  . Lumbar laminectomy/decompression microdiscectomy N/A 08/14/2012    Procedure: LUMBAR  LAMINECTOMY/DECOMPRESSION MICRODISCECTOMY Lumbar 5 -sacrum 1 decompression;  Surgeon: Sinclair Ship, MD;  Location: [place];  Service: Orthopedics;  Laterality: N/A;  Lumbar 5 -sacrum 1 decompression    Family History  Problem Relation Age of Onset  . Stomach cancer      uncle  . Breast cancer      two aunts  . Cancer      unknown grandfather  . Diabetes      grandmother, aunt and 2 uncles  . Diabetes Maternal Grandmother     History  Substance Use Topics  . Smoking status: Never Smoker   . Smokeless tobacco: Not on file  . Alcohol Use: No    Allergies: No Known Allergies  Prescriptions prior to admission  Medication Sig Dispense Refill Last Dose  . citalopram (CELEXA) 40 MG tablet Take 1 tablet (40 mg total) by mouth daily. 30 tablet 6 Taking  . cyclobenzaprine (FLEXERIL) 5 MG tablet Take 1 tablet (5 mg total) by mouth 2 (two) times daily as needed for muscle spasms. 60 tablet 6 Taking  . doxycycline (VIBRAMYCIN) 100 MG capsule Take 1 capsule (100 mg total) by mouth 2 (two) times daily. [place]  capsule 0   . pregabalin (LYRICA) 150 MG capsule Take 1 capsule (150 mg total) by mouth 2 (two) times daily. May take 2 tabs at night as needed. 90 capsule 3 Taking  . PRESCRIPTION MEDICATION Place 1 drop into both eyes 2 (two) times daily as needed. Dry eyes   Taking  . traMADol (ULTRAM) 50 MG tablet take 1 tablet by mouth every 12 hours if needed for pain 60 tablet 0 Taking    Review of Systems  Constitutional: Positive for chills. Negative for fever and malaise/fatigue.  Gastrointestinal: Positive for abdominal pain.  Genitourinary:       Neg - vaginal bleeding, discharge  Skin: Positive for rash.   Physical Exam   Blood pressure 109/72, pulse 74, temperature 98.5 F (36.9 C), temperature source Oral, resp. rate 18, height 5\' 10"  (1.778 m), weight 263 lb (119.296 kg), last menstrual period 12/01/2013, SpO2 100 %.  Physical Exam  Constitutional: She appears well-developed  and well-nourished. No distress.  HENT:  Head: Normocephalic.  Cardiovascular: Normal rate.   Respiratory: Effort normal.  GI: Soft. She exhibits no distension and no mass. There is no tenderness. There is no rebound and no guarding.  Skin: Skin is warm and dry. No erythema.  1 cm abscess noted on the right lower leg with ~ 3 cm of surrounding cellulitis. Mildly tender to touch. No active drainage.   Psychiatric: She has a normal mood and affect.   Results for orders placed or performed during the hospital encounter of 12/25/13 (from the past 24 hour(s))  Urinalysis, Routine w reflex microscopic     Status: None   Collection Time: 12/25/13  3:25 PM  Result Value Ref Range   Color, Urine YELLOW YELLOW   APPearance CLEAR CLEAR   Specific Gravity, Urine 1.020 1.005 - 1.030   pH 7.0 5.0 - 8.0   Glucose, UA NEGATIVE NEGATIVE mg/dL   Hgb urine dipstick NEGATIVE NEGATIVE   Bilirubin Urine NEGATIVE NEGATIVE   Ketones, ur NEGATIVE NEGATIVE mg/dL   Protein, ur NEGATIVE NEGATIVE mg/dL   Urobilinogen, UA 0.2 0.0 - 1.0 mg/dL   Nitrite NEGATIVE NEGATIVE   Leukocytes, UA NEGATIVE NEGATIVE  Pregnancy, urine POC     Status: None   Collection Time: 12/25/13  3:34 PM  Result Value Ref Range   Preg Test, Ur NEGATIVE NEGATIVE    MAU Course  Procedures None  MDM UPT - negative UA today Discussed option of I&D vs antibiotics and warm compress. Patient prefers antibiotics and warm compress at this time  Assessment and Plan  A: Abscess of right lower leg with surrounding cellulitis  P: Discharge home Rx for Bactrim given to patient Patient advised to use warm compress to promote spontaneous drainage Patient advised to go to [facility] if symptoms worsen or she develops fever Patient advised to follow-up with [facility] as scheduled for follow-up Patient may return to MAU as needed or if her condition were to change or worsen   Luvenia Redden, PA-C  12/25/2013, 4:02 PM

## 2013-12-26 LAB — URINE CULTURE: Colony Count: 4000

## 2013-12-28 NOTE — Telephone Encounter (Signed)
CSW placed call to Ms. Gorelick to follow up on referral for counseling resources.  Pt states she has been unable to follow up with [place] due to more pressing medical issues.  Pt states "I've been sick".  Informed Ms. Dinan; CSW is available to assist when she is ready for receives services for mental health.  CSW will sign off at this time.  Will send letter with information.

## 2013-12-29 DIAGNOSIS — M545 Low back pain: Secondary | ICD-10-CM | POA: Diagnosis not present

## 2013-12-30 NOTE — Progress Notes (Signed)
Internal Medicine Clinic Attending  Case discussed with Dr. Qureshi soon after the resident saw the patient.  We reviewed the resident's history and exam and pertinent patient test results.  I agree with the assessment, diagnosis, and plan of care documented in the resident's note. 

## 2014-01-05 ENCOUNTER — Ambulatory Visit (INDEPENDENT_AMBULATORY_CARE_PROVIDER_SITE_OTHER): Payer: Medicare Other | Admitting: Internal Medicine

## 2014-01-05 ENCOUNTER — Encounter: Payer: Self-pay | Admitting: Dietician

## 2014-01-05 ENCOUNTER — Encounter: Payer: Self-pay | Admitting: Internal Medicine

## 2014-01-05 ENCOUNTER — Ambulatory Visit (INDEPENDENT_AMBULATORY_CARE_PROVIDER_SITE_OTHER): Payer: Medicare Other | Admitting: Dietician

## 2014-01-05 VITALS — BP 103/66 | HR 66 | Temp 98.3°F | Ht 70.0 in | Wt 263.3 lb

## 2014-01-05 DIAGNOSIS — Z7189 Other specified counseling: Secondary | ICD-10-CM

## 2014-01-05 DIAGNOSIS — N730 Acute parametritis and pelvic cellulitis: Secondary | ICD-10-CM | POA: Diagnosis not present

## 2014-01-05 NOTE — Progress Notes (Addendum)
Health Coaching  Start time: 10:28 am   End time: 11:05 am Patient Identified Concern:  She is attending this visit per the doctor's request concerning her weight gain/obesity.  She feels she that she now has her life situated where she can make healthy lifestyle changes with the support of the year long Diabetes Prevention Program(DPP) she plans to attend. She is also concerned about her weight gain and her risk of developing diabetes Stage of Change Patient Is In:  Ready per patient- she has signed up for the [facility] Diabetes Prevention Program and plans to start this month.  Patient Reported Barriers:  She had barriers to a healthy lifestyle that she feels caused her regain. She has worked at removing them and she is now in place where she can work on weight loss again. She reports she now lives in a different house with supportive people, back pain and fibromyalgia are under control.  Patient Reported Perceived Benefits:  She verbalizes that weight loss is important for her joints, prevention of diabetes and has hear it can also help her fibromyalgia Patient Reports Self-Efficacy:   She appears self confident in what she needs to do to achieve her goals Behavior Change Supports:  Her roommate, her family, the DPP  And [employer] staff Goals:  Weight loss & feel better Patient Education:  Encouraged her to participate in the program to it's fullest, use CDE as a resource as needed.

## 2014-01-05 NOTE — Progress Notes (Signed)
Internal Medicine Clinic Attending  Case discussed with Dr. Wilson soon after the resident saw the patient.  We reviewed the resident's history and exam and pertinent patient test results.  I agree with the assessment, diagnosis, and plan of care documented in the resident's note.  

## 2014-01-05 NOTE — Progress Notes (Signed)
   Subjective:    Patient ID: Jordan [name], female    DOB: 19-Mar-1980, 33 y.o.   MRN: [id number]  HPI Comments: Jordan [name] is a 33 yo F with a PMH of GERD and fibromyalgia here for follow-up of PID.  She has been compliant with doxycycline BID and has a few more days of treatment remaining.  She has not missed any doses.  She reports significant improvement in symptoms and denies fevers, chills or N/V.      Review of Systems  Constitutional: Negative for fever, chills and appetite change.  Respiratory: Negative for shortness of breath.   Cardiovascular: Negative for chest pain.  Gastrointestinal: Negative for nausea, vomiting, abdominal pain and diarrhea.  Genitourinary: Negative for dysuria, vaginal bleeding, vaginal discharge and vaginal pain.       Objective:   Physical Exam  Constitutional: She appears well-developed. No distress.  HENT:  Head: Normocephalic and atraumatic.  Mouth/Throat: Oropharynx is clear and moist. No oropharyngeal exudate.  Eyes: EOM are normal. Pupils are equal, round, and reactive to light.  Cardiovascular: Normal rate, regular rhythm and normal heart sounds.  Exam reveals no gallop and no friction rub.   No murmur heard. Pulmonary/Chest: Effort normal. No respiratory distress. She has no wheezes. She has no rales.  Abdominal: Soft. Bowel sounds are normal. She exhibits no distension. There is tenderness. There is no rebound.  Minimal suprapubic tenderness.  Genitourinary: Vagina normal. No vaginal discharge found.  Minimal tenderness on bimanual exam.   Musculoskeletal: Normal range of motion. She exhibits no edema or tenderness.  Neurological: She is alert. No cranial nerve deficit.  Skin: Skin is warm. She is not diaphoretic.  Psychiatric: She has a normal mood and affect. Her behavior is normal.  Vitals reviewed.         Assessment & Plan:  Please see problem based assessment and plan.

## 2014-01-05 NOTE — Patient Instructions (Signed)
1. Your infection has improved and seems to be nearly resolved.  If your symptoms return of you stop improving once antibiotics are complete please return to clinic.     2. Please take all medications as prescribed.    3. If you have worsening of your symptoms or new symptoms arise, please call the clinic ([phone]), or go to the ER immediately if symptoms are severe.  Please try to bring all your medicines next time. This helps [place] take good care of you and stops mistakes from medicines that could hurt you.

## 2014-01-05 NOTE — Assessment & Plan Note (Addendum)
Unclear etiology as patient denies GC/CT hx and GC/CT negative.  She has been compliant with doxycycline and reports improvement in pelvic pain.  Only minimal pain on bimanual exam (she reports this has greatly improved since exam two weeks ago).  She denies N/V, fevers/chills, vaginal discharge/abnormal bleeding or dysuria.   - continue doxycycline until finished (a few days left)  - follow-up in clinic if symptoms return or stop improving - seek emergency care if severe symptoms

## 2014-01-27 ENCOUNTER — Ambulatory Visit: Payer: Medicare Other | Admitting: Physical Therapy

## 2014-02-07 IMAGING — CR DG CHEST 2V
2 series · 2 of 2 positions shown · non-contrast
Comparison: 12/02/2010.

CLINICAL DATA: Preoperative evaluation.  Patient for lumbar
surgery.

CHEST - 2 VIEW

[view not recorded (1 of 2)]
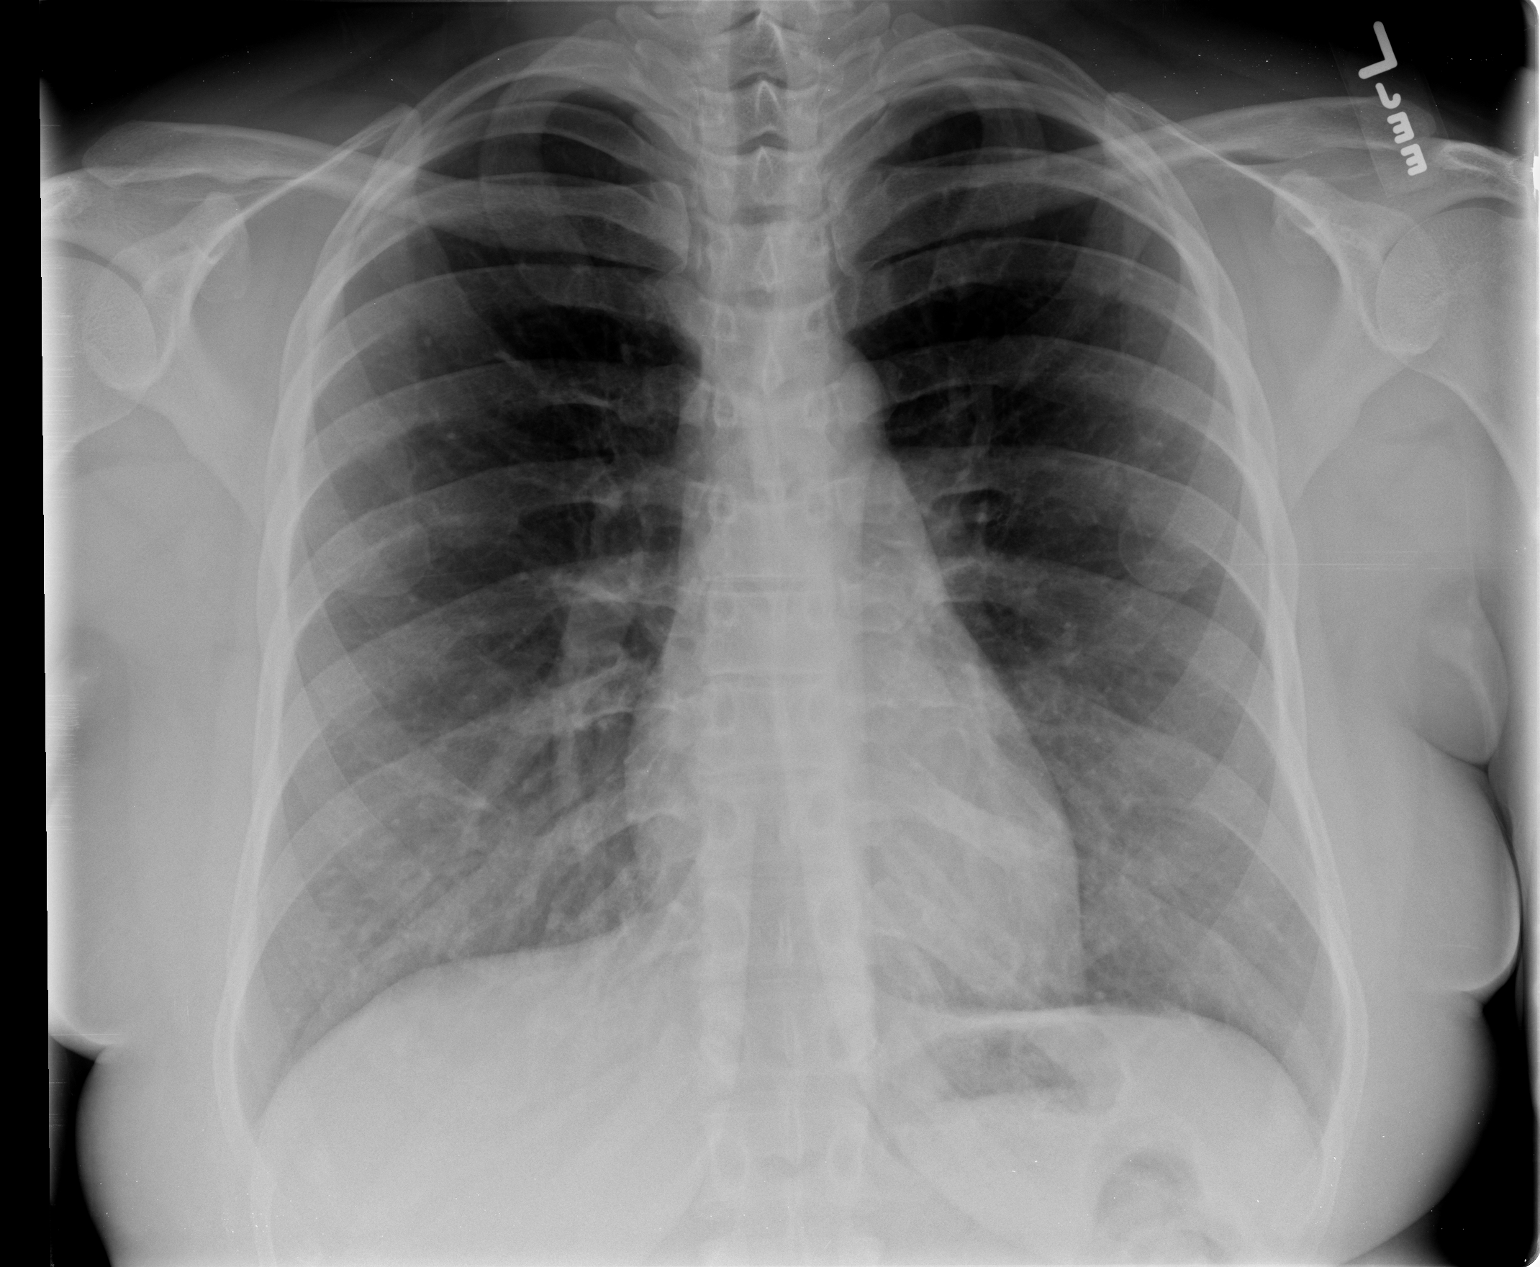

[view not recorded (2 of 2)]
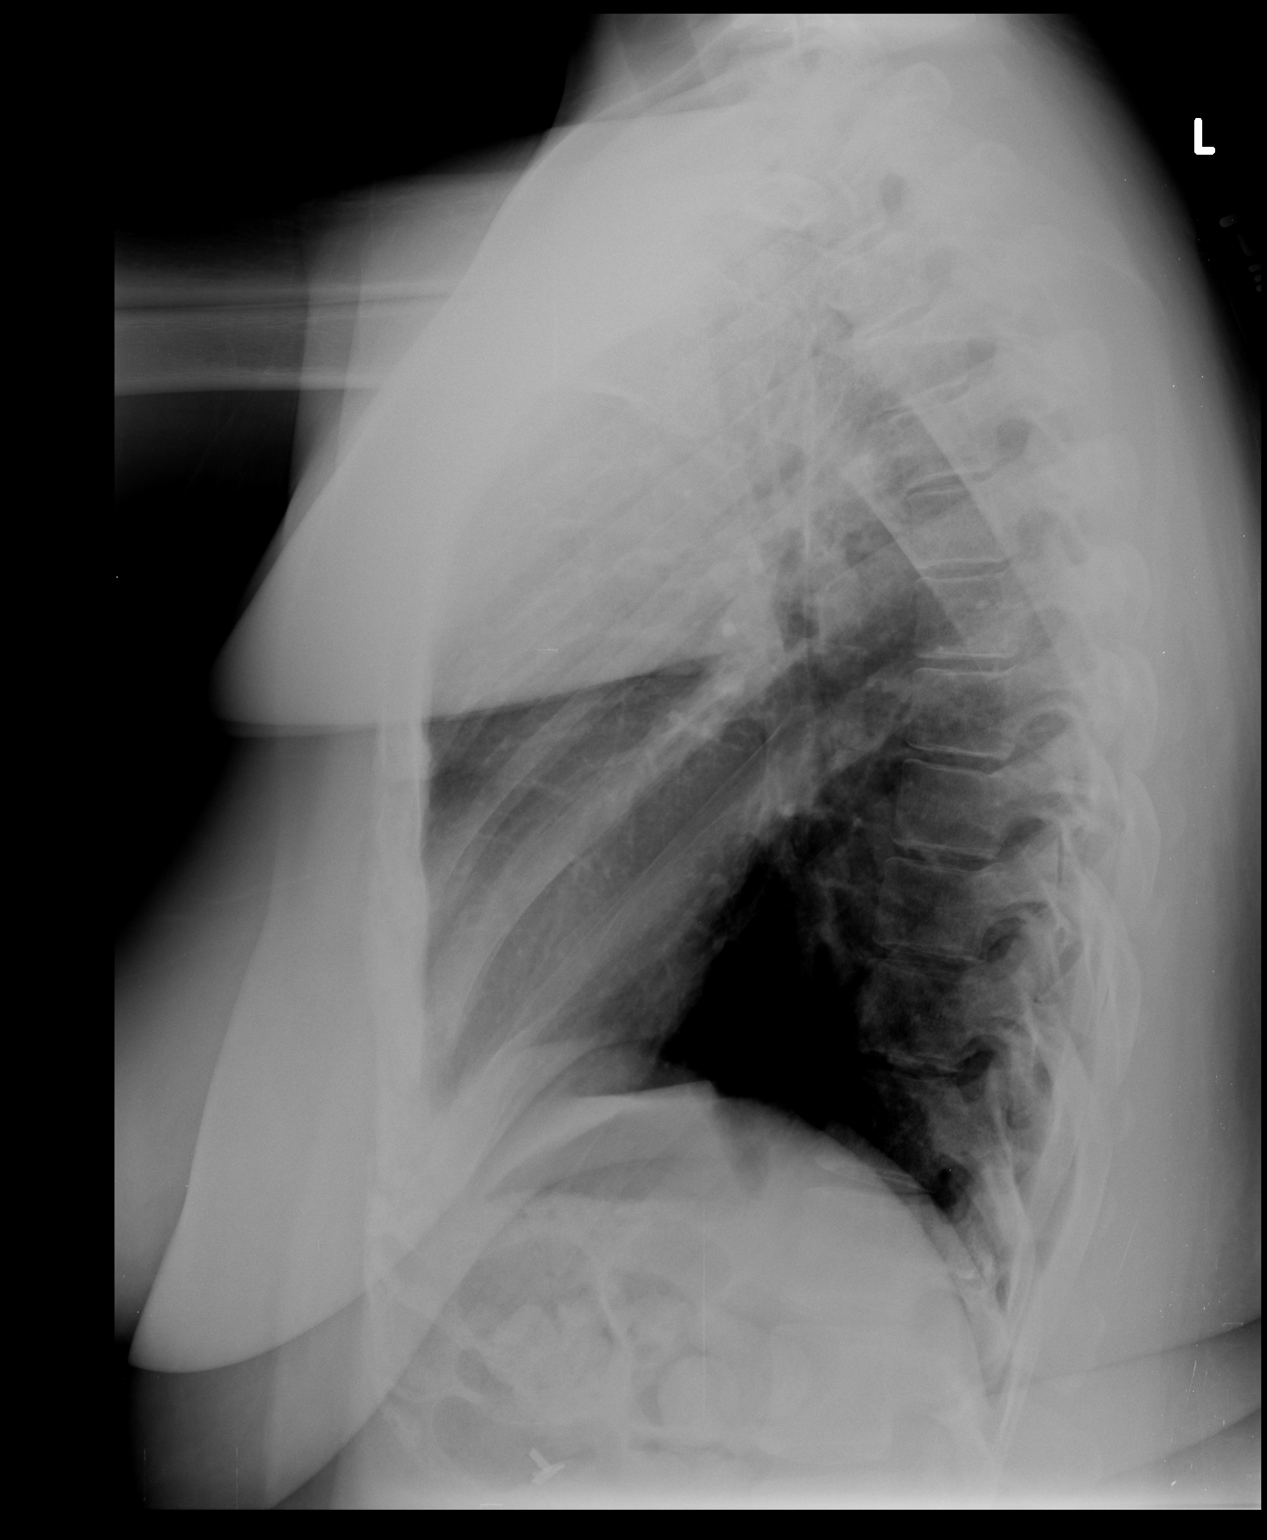

[2 of 2 positions shown; findings below may reference images not displayed]

FINDINGS: There is normal appearance of cardiac silhouette. Hilar
and mediastinal contours appear stable and normal..  No pulmonary
infiltrates or masses were evident. No pleural abnormality is
evident.  No skeletal lesions are seen.
IMPRESSION: No acute or active cardiopulmonary or pleural abnormalities are
evident.

## 2014-02-08 ENCOUNTER — Encounter: Payer: Self-pay | Admitting: Internal Medicine

## 2014-02-08 ENCOUNTER — Ambulatory Visit (INDEPENDENT_AMBULATORY_CARE_PROVIDER_SITE_OTHER): Payer: Medicare Other | Admitting: Internal Medicine

## 2014-02-08 VITALS — BP 102/60 | HR 73 | Temp 98.8°F | Ht 70.0 in | Wt 265.4 lb

## 2014-02-08 DIAGNOSIS — Z0000 Encounter for general adult medical examination without abnormal findings: Secondary | ICD-10-CM

## 2014-02-08 DIAGNOSIS — J069 Acute upper respiratory infection, unspecified: Secondary | ICD-10-CM | POA: Diagnosis not present

## 2014-02-08 MED ORDER — BENZONATATE 100 MG PO CAPS: 100.0000 mg | ORAL_CAPSULE | Freq: Three times a day (TID) | ORAL | Status: DC | PRN

## 2014-02-08 NOTE — Patient Instructions (Signed)
Thank you for your visit today.   You likely have a viral respiratory infection.  Please see the information below.   Your current medical regimen is effective;  continue present plan and take all medications as prescribed.   I will send you some tessalon pearles to your pharmacy.  Please take this as instructed on the bottle. It may take several weeks for your cough to clear.    Please be sure to bring all of your medications with you to every visit; this includes herbal supplements, vitamins, eye drops, and any over-the-counter medications.   Should you have any questions regarding your medications and/or any new or worsening symptoms, please be sure to call the clinic at [phone].   If you believe that you are suffering from a life threatening condition or one that may result in the loss of limb or function, then you should call 911 or proceed to the nearest Emergency Department.     Upper Respiratory Infection, Adult An upper respiratory infection (URI) is also sometimes known as the common cold. The upper respiratory tract includes the nose, sinuses, throat, trachea, and bronchi. Bronchi are the airways leading to the lungs. Most people improve within 1 week, but symptoms can last up to 2 weeks. A residual cough may last even longer.  CAUSES Many different viruses can infect the tissues lining the upper respiratory tract. The tissues become irritated and inflamed and often become very moist. Mucus production is also common. A cold is contagious. You can easily spread the virus to others by oral contact. This includes kissing, sharing a glass, coughing, or sneezing. Touching your mouth or nose and then touching a surface, which is then touched by another person, can also spread the virus. SYMPTOMS  Symptoms typically develop 1 to 3 days after you come in contact with a cold virus. Symptoms vary from person to person. They may include:  Runny nose.  Sneezing.  Nasal  congestion.  Sinus irritation.  Sore throat.  Loss of voice (laryngitis).  Cough.  Fatigue.  Muscle aches.  Loss of appetite.  Headache.  Low-grade fever. DIAGNOSIS  You might diagnose your own cold based on familiar symptoms, since most people get a cold 2 to 3 times a year. Your caregiver can confirm this based on your exam. Most importantly, your caregiver can check that your symptoms are not due to another disease such as strep throat, sinusitis, pneumonia, asthma, or epiglottitis. Blood tests, throat tests, and X-rays are not necessary to diagnose a common cold, but they may sometimes be helpful in excluding other more serious diseases. Your caregiver will decide if any further tests are required. RISKS AND COMPLICATIONS  You may be at risk for a more severe case of the common cold if you smoke cigarettes, have chronic heart disease (such as heart failure) or lung disease (such as asthma), or if you have a weakened immune system. The very young and very old are also at risk for more serious infections. Bacterial sinusitis, middle ear infections, and bacterial pneumonia can complicate the common cold. The common cold can worsen asthma and chronic obstructive pulmonary disease (COPD). Sometimes, these complications can require emergency medical care and may be life-threatening. PREVENTION  The best way to protect against getting a cold is to practice good hygiene. Avoid oral or hand contact with people with cold symptoms. Wash your hands often if contact occurs. There is no clear evidence that vitamin C, vitamin E, echinacea, or exercise reduces the chance of  developing a cold. However, it is always recommended to get plenty of rest and practice good nutrition. TREATMENT  Treatment is directed at relieving symptoms. There is no cure. Antibiotics are not effective, because the infection is caused by a virus, not by bacteria. Treatment may include:  Increased fluid intake. Sports drinks  offer valuable electrolytes, sugars, and fluids.  Breathing heated mist or steam (vaporizer or shower).  Eating chicken soup or other clear broths, and maintaining good nutrition.  Getting plenty of rest.  Using gargles or lozenges for comfort.  Controlling fevers with ibuprofen or acetaminophen as directed by your caregiver.  Increasing usage of your inhaler if you have asthma. Zinc gel and zinc lozenges, taken in the first 24 hours of the common cold, can shorten the duration and lessen the severity of symptoms. Pain medicines may help with fever, muscle aches, and throat pain. A variety of non-prescription medicines are available to treat congestion and runny nose. Your caregiver can make recommendations and may suggest nasal or lung inhalers for other symptoms.  HOME CARE INSTRUCTIONS   Only take over-the-counter or prescription medicines for pain, discomfort, or fever as directed by your caregiver.  Use a warm mist humidifier or inhale steam from a shower to increase air moisture. This may keep secretions moist and make it easier to breathe.  Drink enough water and fluids to keep your urine clear or pale yellow.  Rest as needed.  Return to work when your temperature has returned to normal or as your caregiver advises. You may need to stay home longer to avoid infecting others. You can also use a face mask and careful hand washing to prevent spread of the virus. SEEK MEDICAL CARE IF:   After the first few days, you feel you are getting worse rather than better.  You need your caregiver's advice about medicines to control symptoms.  You develop chills, worsening shortness of breath, or brown or red sputum. These may be signs of pneumonia.  You develop yellow or brown nasal discharge or pain in the face, especially when you bend forward. These may be signs of sinusitis.  You develop a fever, swollen neck glands, pain with swallowing, or white areas in the back of your throat.  These may be signs of strep throat. SEEK IMMEDIATE MEDICAL CARE IF:   You have a fever.  You develop severe or persistent headache, ear pain, sinus pain, or chest pain.  You develop wheezing, a prolonged cough, cough up blood, or have a change in your usual mucus (if you have chronic lung disease).  You develop sore muscles or a stiff neck. Document Released: 08/01/2000 Document Revised: 04/30/2011 Document Reviewed: 05/13/2013 [facility] Patient Information 2015 [place], [place]. This information is not intended to replace advice given to you by your health care provider. Make sure you discuss any questions you have with your health care provider.

## 2014-02-08 NOTE — Progress Notes (Signed)
Patient ID: Jordan [name], female   DOB: Apr 30, 1980, 33 y.o.   MRN: [id number]     Subjective:   Patient ID: Jordan [name] female    DOB: February 06, 1981 33 y.o.    MRN: [id number] Health Maintenance Due: There are no preventive care reminders to display for this patient.  _________________________________________________  HPI: [name] is a 33 y.o. female here for an acute visit.  Pt has a PMH outlined below.  Please see problem-based charting assessment and plan note for further details of medical issues addressed at today's visit.  PMH: Past Medical History  Diagnosis Date  . Basal cell carcinoma     recurrent in left maxillary, has had 5 surguries, last one 2003  . Fibroma     left ovarian  . Cyst of nasal sinus   . Candidiasis, vagina     recurrent, pt has made lifestyle modifications and none recently (as of 8/12)  . Gardnerella infection     + in 08/08  . Whitlow     hx of herpetic requiring I+D,( was bitten by autistic child that cares fr  . Allergic rhinitis     pt takes flonase for episodes, no episodes in 2012  . PLANTAR FASCIITIS, BILATERAL 05/24/2009  . Paronychia of third finger of left hand 07/28/2010  . Fibromyalgia   . Herniated disc   . Anxiety   . GERD (gastroesophageal reflux disease)     occ  . OVARIAN CYSTECTOMY, HX OF 03/01/2006    ovarian fibroma-left 1996, L ovary and fallopian tube removed      Medications: Current Outpatient Prescriptions on File Prior to Visit  Medication Sig Dispense Refill  . acetaminophen (TYLENOL ARTHRITIS PAIN) 650 MG CR tablet Take 650 mg by mouth every 8 (eight) hours as needed for pain.    . cholecalciferol (VITAMIN D) 1000 UNITS tablet Take 1,000 Units by mouth daily.    . citalopram (CELEXA) 40 MG tablet Take 1 tablet (40 mg total) by mouth daily. 30 tablet 6  . cyclobenzaprine (FLEXERIL) 5 MG tablet Take 1 tablet (5 mg total) by mouth 2 (two) times daily as needed for muscle spasms. 60 tablet 6  . doxycycline  (DORYX) 100 MG EC tablet Take 100 mg by mouth 2 (two) times daily. 14 day dose prescribed 12-24-13.  Patient just started 12-24-13    . Multiple Vitamins-Minerals (ADULT ONE DAILY GUMMIES PO) Take 2 tablets by mouth daily.    . pregabalin (LYRICA) 150 MG capsule Take 1 capsule (150 mg total) by mouth 2 (two) times daily. May take 2 tabs at night as needed. 90 capsule 3  . PRESCRIPTION MEDICATION Place 1 drop into both eyes 2 (two) times daily as needed. Dry eyes    . traMADol (ULTRAM) 50 MG tablet take 1 tablet by mouth every 12 hours if needed for pain 60 tablet 0   No current facility-administered medications on file prior to visit.    Allergies: No Known Allergies  FH: Family History  Problem Relation Age of Onset  . Stomach cancer      uncle  . Breast cancer      two aunts  . Cancer      unknown grandfather  . Diabetes      grandmother, aunt and 2 uncles  . Diabetes Maternal Grandmother     SH: History   Social History  . Marital Status: Single    Spouse Name: N/A    Number of Children: N/A  .  Years of Education: N/A   Occupational History  . unemployed   . disability    Social History Main Topics  . Smoking status: Never Smoker   . Smokeless tobacco: None  . Alcohol Use: No  . Drug Use: No  . Sexual Activity: Not Currently    Birth Control/ Protection: None   Other Topics Concern  . None   Social History Narrative   Lives in [place] in house with partner    Review of Systems: Constitutional: Negative for fever, chills and weight loss.  Eyes: Negative for blurred vision.  Respiratory: +cough and shortness of breath.  Cardiovascular: Negative for chest pain, palpitations and leg swelling.  Gastrointestinal: Negative for nausea, vomiting, abdominal pain, diarrhea, constipation and blood in stool.  Genitourinary: Negative for dysuria, urgency and frequency.  Musculoskeletal: Negative for myalgias and back pain.  Neurological: Negative for dizziness,  weakness and headaches.     Objective:   Vital Signs: Filed Vitals:   02/08/14 1330  BP: 102/60  Pulse: 73  Temp: 98.8 F (37.1 C)  TempSrc: Oral  Height: 5\' 10"  (1.778 m)  Weight: 265 lb 6.4 oz (120.385 kg)  SpO2: 100%      BP Readings from Last 3 Encounters:  02/08/14 102/60  01/05/14 103/66  12/25/13 111/74    Physical Exam: Constitutional: Vital signs reviewed.  Patient is well-developed and well-nourished in NAD and cooperative with exam.  Head: Normocephalic and atraumatic. Eyes: PERRL, EOMI, conjunctivae nl, no scleral icterus.  Ears: TMs wnl. Nose: Nares wnl. Throat: Slight posterior erythema, no exudates.   Neck: Supple, no adenopathy.  Cardiovascular: RRR, no MRG. Pulmonary/Chest: normal effort, non-tender to palpation, CTAB, minimal expiratory wheezes at the bases without rales or rhonchi. Abdominal: Soft. Obese. NT/ND +BS. Neurological: A&O x3, cranial nerves II-XII are grossly intact, moving all extremities. Extremities: 2+DP b/l; no pitting edema. Skin: Warm, dry and intact. No rash.  Assessment & Plan:   Assessment and plan was discussed and formulated with my attending.

## 2014-02-08 NOTE — Assessment & Plan Note (Addendum)
Pt p/w 2 weeks of cough occasionally productive of yellow sputum.  No fever/chills, HA, sinus tenderness/pressure, N/V/D.  Endorses sore throat, SOB with mild occasional wheezes, sneezing, occasional body aches.  Has been using alka seltzer and thera flu without relief.  Is eating and drinking OK.  Non-smoker, no h/o asthma.  Sick contacts in her daughter and other relatives with URI s/s.  Physical exam reveals no abnormality except for bilateral mild expiratory wheezes at the bases.  Not tachycardic, tachypneic, or febrile.  O2 sat 100% RA.   -symptomatic tx including drinking plenty of fluids -tessalon pearles for cough -advised to RTC if symptoms worsen -advised home humidifier may help  -advised that she may experience cough for several weeks

## 2014-02-08 NOTE — Progress Notes (Signed)
Case discussed with Dr. Gill soon after the resident saw the patient.  We reviewed the resident's history and exam and pertinent patient test results.  I agree with the assessment, diagnosis, and plan of care documented in the resident's note. 

## 2014-02-09 IMAGING — CR DG LUMBAR SPINE 2-3V
2 series · 2 of 2 positions shown · non-contrast
Comparison: MRI dated 07/26/2012

CLINICAL DATA: L5-S1 disc protrusion.

LUMBAR SPINE - 2-3 VIEW

[lateral (1 of 2)]
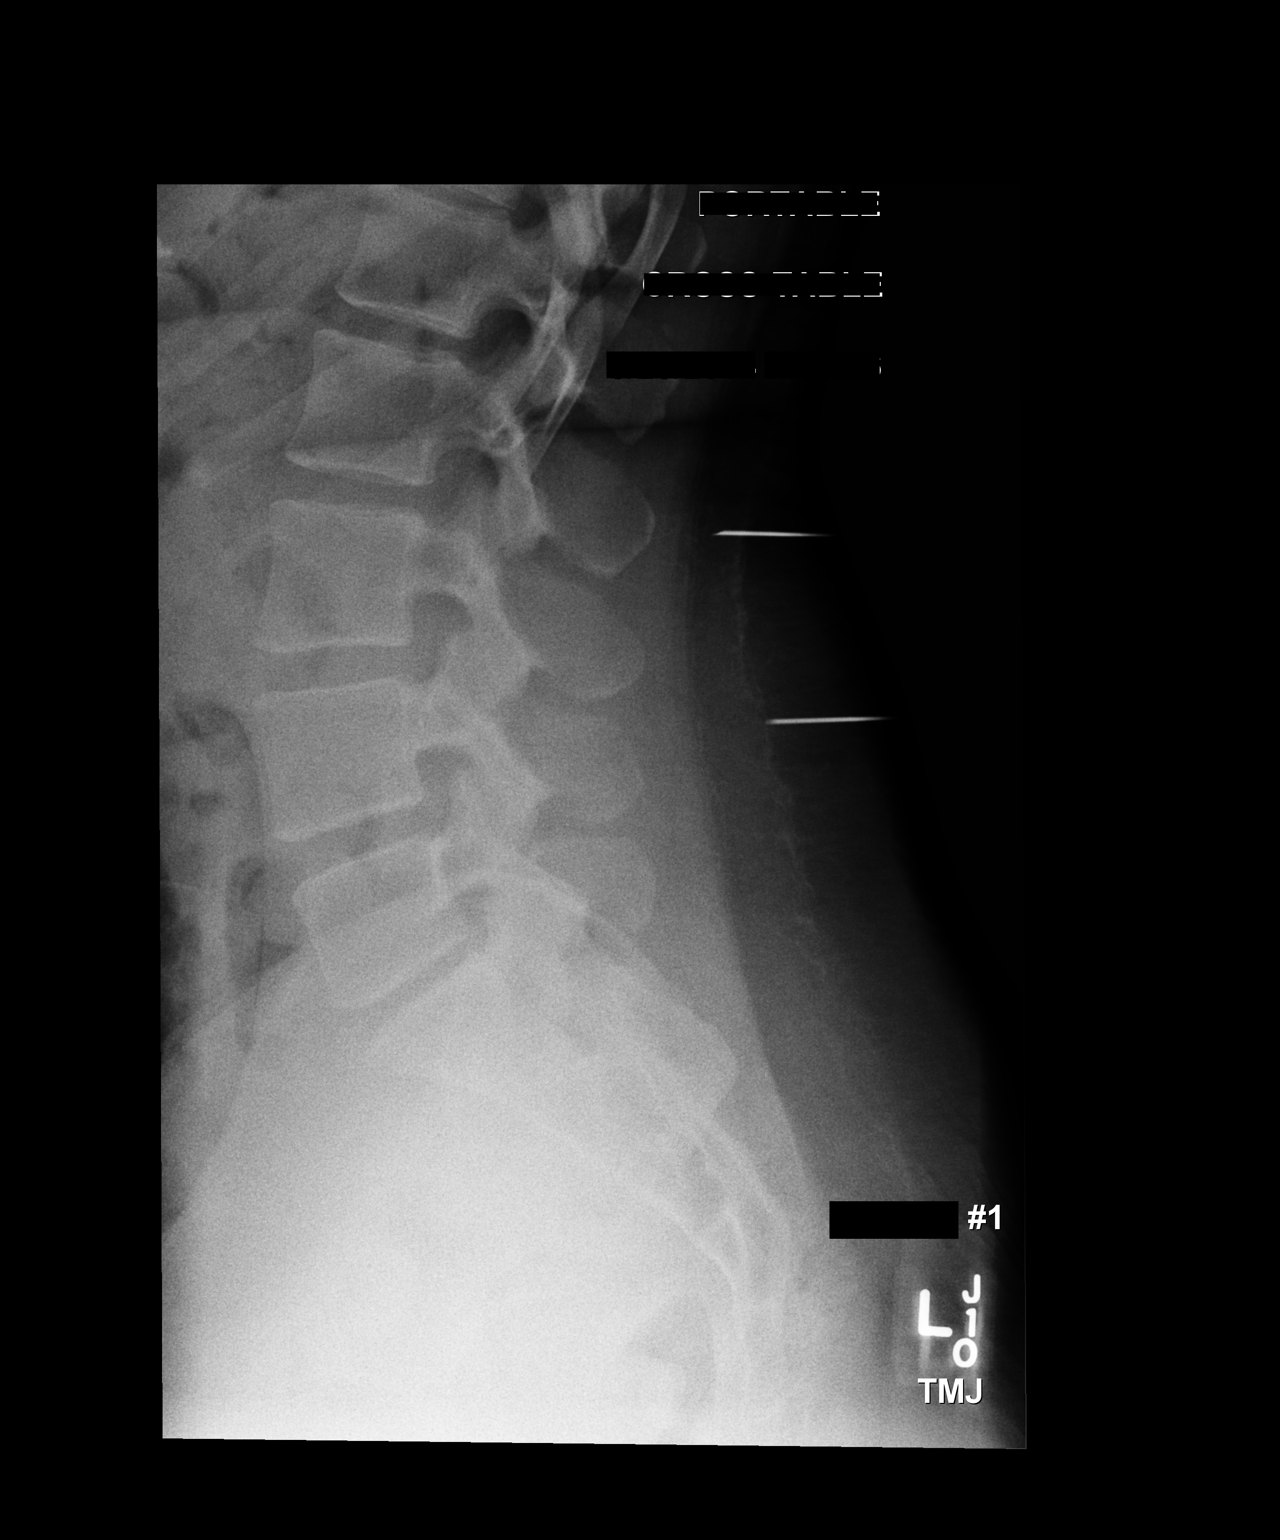

[lateral (2 of 2)]
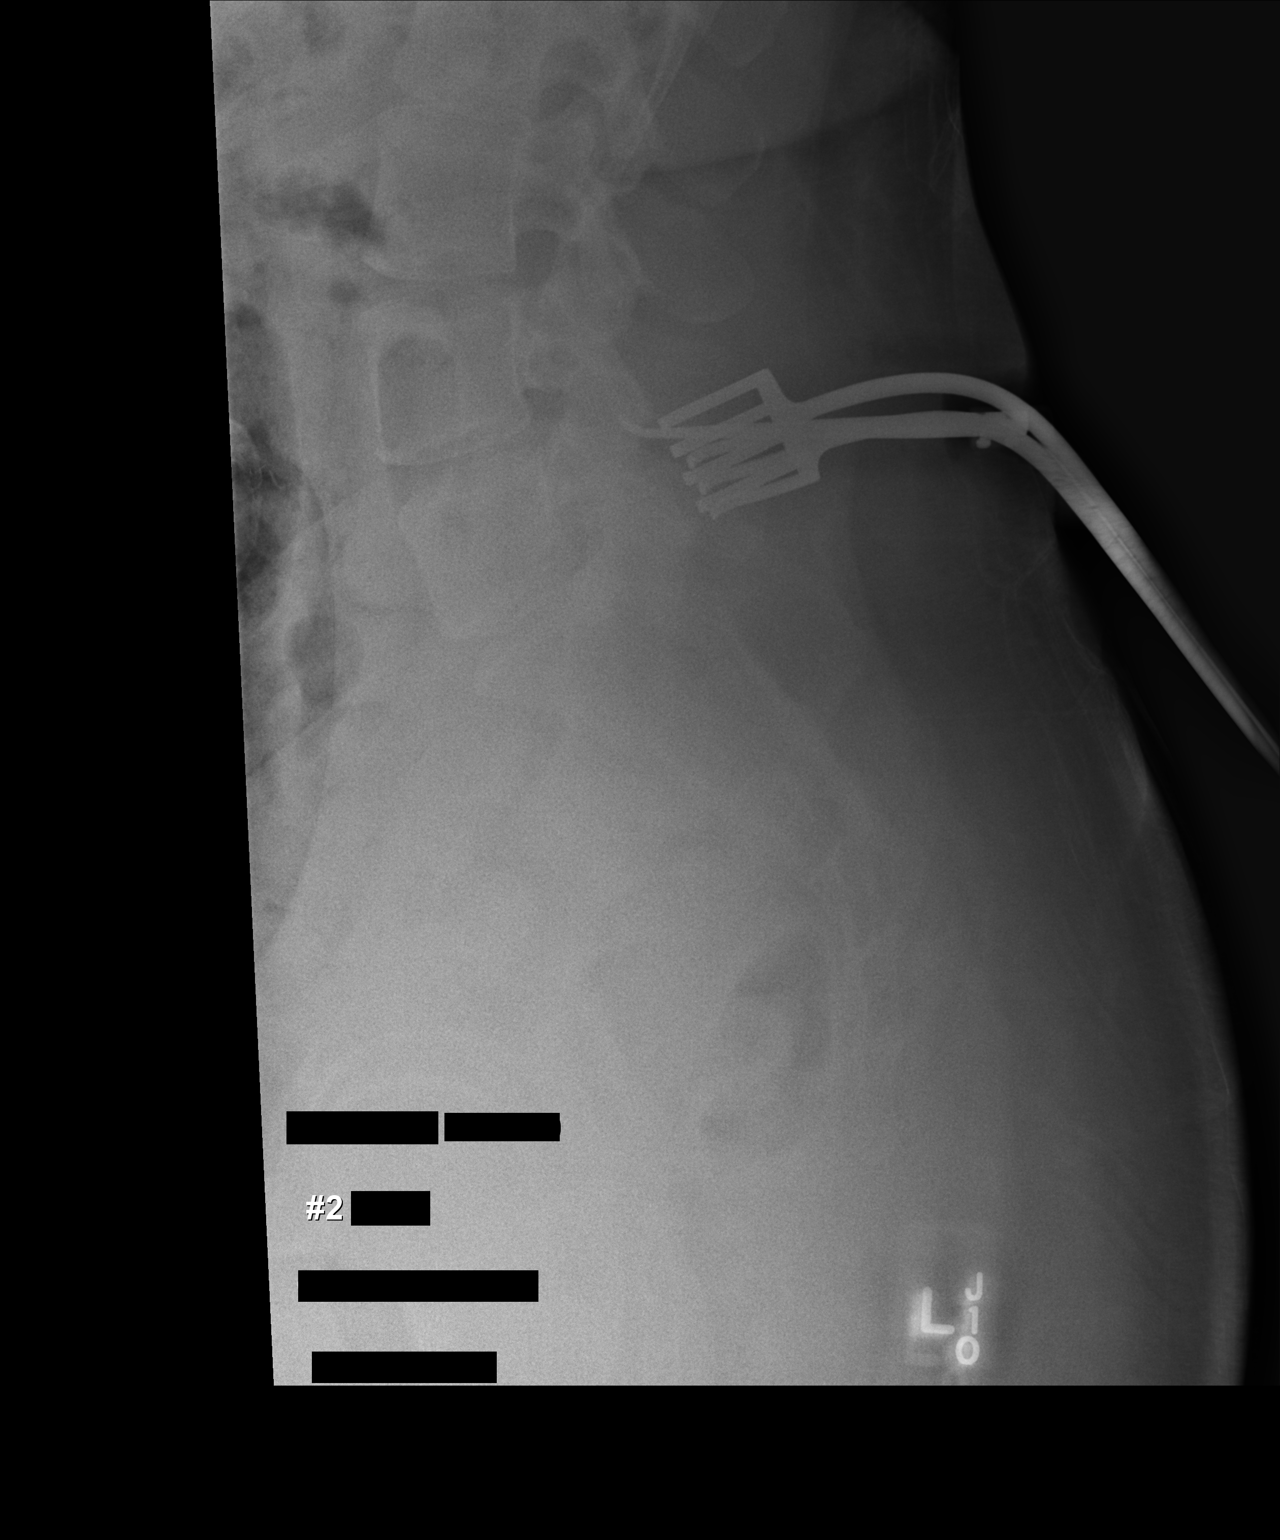

[2 of 2 positions shown; findings below may reference images not displayed]

FINDINGS: Radiograph #1 demonstrates needles at the level of the
spinous processes of L2 and L4.  Radiograph #2l demonstrates an
instrument at the L4-5 level.
IMPRESSION: Radiograph #2 demonstrates an instrument at L4-5.

## 2014-02-09 NOTE — Assessment & Plan Note (Signed)
UTD

## 2014-02-24 NOTE — Addendum Note (Signed)
Addended by: Truddie Crumble on: 02/24/2014 09:59 AM   Modules accepted: Orders

## 2014-03-05 ENCOUNTER — Encounter: Payer: Self-pay | Admitting: Internal Medicine

## 2014-03-05 ENCOUNTER — Ambulatory Visit (INDEPENDENT_AMBULATORY_CARE_PROVIDER_SITE_OTHER): Payer: Medicare Other | Admitting: Internal Medicine

## 2014-03-05 VITALS — BP 105/71 | HR 72 | Temp 98.1°F | Ht 70.0 in | Wt 258.4 lb

## 2014-03-05 DIAGNOSIS — M797 Fibromyalgia: Secondary | ICD-10-CM

## 2014-03-05 DIAGNOSIS — Z789 Other specified health status: Secondary | ICD-10-CM

## 2014-03-05 DIAGNOSIS — Z9189 Other specified personal risk factors, not elsewhere classified: Secondary | ICD-10-CM

## 2014-03-05 DIAGNOSIS — E669 Obesity, unspecified: Secondary | ICD-10-CM | POA: Diagnosis not present

## 2014-03-05 DIAGNOSIS — J069 Acute upper respiratory infection, unspecified: Secondary | ICD-10-CM

## 2014-03-05 DIAGNOSIS — Z0000 Encounter for general adult medical examination without abnormal findings: Secondary | ICD-10-CM

## 2014-03-05 MED ORDER — PREGABALIN 150 MG PO CAPS: 150.0000 mg | ORAL_CAPSULE | Freq: Every day | ORAL | Status: DC

## 2014-03-05 MED ORDER — CITALOPRAM HYDROBROMIDE 40 MG PO TABS: 40.0000 mg | ORAL_TABLET | Freq: Every day | ORAL | Status: DC

## 2014-03-05 NOTE — Assessment & Plan Note (Signed)
Resolved

## 2014-03-05 NOTE — Assessment & Plan Note (Addendum)
Pt states she feels "good."  Was diagnosed with fibromyalgia by a rheumatologist per her report.  She has her own apartment now with her terrier and walks daily.  She was previously living with family which was not a good situation for her.  She is not working.  She has a positive attitude and is willing to improve her health including weight loss and better eating habits.  She looks the best I have seen her.  We discussed medications that she wants to be weaned down on all of her meds but wants to start with lyrica.  She is currently taking lyrica bid and would like to cut this down.  I recommended that she take 1 tab at bedtime and return in a month.  I told her to call if she has problems in the mean time.  Also continues with PT.  I congratulated her on success with losing weight and exercising.  [facility]Ha[facility].6).   -d/c lyrica 150mg  bid -continue lyrica 150mg  at bedtime (will continue to wean further) -continue flexeril as needed  -continue PT -refilled celexa (may not be the best choice given her weight issues)  -f/u in 1 month

## 2014-03-05 NOTE — Progress Notes (Addendum)
Patient ID: Jordan [name], female   DOB: Jul 15, 1980, 34 y.o.   MRN: [id number]    Subjective:   Patient ID: Jordan [name] female    DOB: Jun 02, 1980 34 y.o.    MRN: [id number] Health Maintenance Due: There are no preventive care reminders to display for this patient.  _________________________________________________  HPI: [name] is a 34 y.o. female here for a routine visit.  Pt has a PMH outlined below.  Please see problem-based charting assessment and plan note for further details of medical issues addressed at today's visit.  PMH: Past Medical History  Diagnosis Date  . Basal cell carcinoma     recurrent in left maxillary, has had 5 surguries, last one 2003  . Fibroma     left ovarian  . Cyst of nasal sinus   . Candidiasis, vagina     recurrent, pt has made lifestyle modifications and none recently (as of 8/12)  . Gardnerella infection     + in 08/08  . Whitlow     hx of herpetic requiring I+D,( was bitten by autistic child that cares fr  . Allergic rhinitis     pt takes flonase for episodes, no episodes in 2012  . PLANTAR FASCIITIS, BILATERAL 05/24/2009  . Paronychia of third finger of left hand 07/28/2010  . Fibromyalgia   . Herniated disc   . Anxiety   . GERD (gastroesophageal reflux disease)     occ  . OVARIAN CYSTECTOMY, HX OF 03/01/2006    ovarian fibroma-left 1996, L ovary and fallopian tube removed      Medications: Current Outpatient Prescriptions on File Prior to Visit  Medication Sig Dispense Refill  . acetaminophen (TYLENOL ARTHRITIS PAIN) 650 MG CR tablet Take 650 mg by mouth every 8 (eight) hours as needed for pain.    . cholecalciferol (VITAMIN D) 1000 UNITS tablet Take 1,000 Units by mouth daily.    . cyclobenzaprine (FLEXERIL) 5 MG tablet Take 1 tablet (5 mg total) by mouth 2 (two) times daily as needed for muscle spasms. 60 tablet 6  . Multiple Vitamins-Minerals (ADULT ONE DAILY GUMMIES PO) Take 2 tablets by mouth daily.    Marland Kitchen PRESCRIPTION  MEDICATION Place 1 drop into both eyes 2 (two) times daily as needed. Dry eyes     No current facility-administered medications on file prior to visit.    Allergies: No Known Allergies  FH: Family History  Problem Relation Age of Onset  . Stomach cancer      uncle  . Breast cancer      two aunts  . Cancer      unknown grandfather  . Diabetes      grandmother, aunt and 2 uncles  . Diabetes Maternal Grandmother     SH: History   Social History  . Marital Status: Single    Spouse Name: N/A    Number of Children: N/A  . Years of Education: N/A   Occupational History  . unemployed   . disability    Social History Main Topics  . Smoking status: Never Smoker   . Smokeless tobacco: None  . Alcohol Use: No  . Drug Use: No  . Sexual Activity: Not Currently    Birth Control/ Protection: None   Other Topics Concern  . None   Social History Narrative   Lives in [place] in house with partner    Review of Systems: Constitutional: Negative for fever, chills and weight loss.  Eyes: Negative for blurred vision.  Respiratory: Negative for cough and shortness of breath.  Cardiovascular: Negative for chest pain, palpitations and leg swelling.  Gastrointestinal: Negative for nausea, vomiting, abdominal pain, diarrhea, constipation and blood in stool.  Genitourinary: Negative for dysuria, urgency and frequency.  Musculoskeletal: Negative for myalgias and back pain.  Neurological: Negative for dizziness, weakness and headaches.     Objective:   Vital Signs: Filed Vitals:   03/05/14 0935  BP: 105/71  Pulse: 72  Temp: 98.1 F (36.7 C)  TempSrc: Oral  Height: 5\' 10"  (1.778 m)  Weight: 258 lb 6.4 oz (117.209 kg)  SpO2: 100%      BP Readings from Last 3 Encounters:  03/05/14 105/71  02/08/14 102/60  01/05/14 103/66    Physical Exam: Constitutional: Vital signs reviewed.  Patient is well-developed and well-nourished in NAD and cooperative with exam.  Head:  Normocephalic and atraumatic. Eyes: PERRL, EOMI, conjunctivae nl, no scleral icterus.  Neck: Supple. Cardiovascular: RRR, no MRG. Pulmonary/Chest: normal effort, non-tender to palpation, CTAB, no wheezes, rales, or rhonchi. Abdominal: Soft. NT/ND +BS. Neurological: A&O x3, cranial nerves II-XII are grossly intact, moving all extremities. Extremities: 2+DP b/l; no pitting edema. Skin: Warm, dry and intact. No rash.   Assessment & Plan:   Assessment and plan was discussed and formulated with my attending.

## 2014-03-05 NOTE — Patient Instructions (Signed)
Thank you for your visit today.   Please return to the internal medicine clinic in about 1 month or sooner if needed.   Congratulations on your weight loss!!  I am glad you have decided to take an active role in your health!! Continue the diabetes program. Your current medical regimen is effective;  continue present plan and take all medications as prescribed.   I have decreased your lyrica to once daily at bedtime.   We will work on decreasing your medications over time.   Please be sure to bring all of your medications with you to every visit; this includes herbal supplements, vitamins, eye drops, and any over-the-counter medications.   Should you have any questions regarding your medications and/or any new or worsening symptoms, please be sure to call the clinic at [phone].   If you believe that you are suffering from a life threatening condition or one that may result in the loss of limb or function, then you should call 911 or proceed to the nearest Emergency Department.     A healthy lifestyle and preventative care can promote health and wellness.   Maintain regular health, dental, and eye exams.  Eat a healthy diet. Foods like vegetables, fruits, whole grains, low-fat dairy products, and lean protein foods contain the nutrients you need without too many calories. Decrease your intake of foods high in solid fats, added sugars, and salt. Get information about a proper diet from your caregiver, if necessary.  Regular physical exercise is one of the most important things you can do for your health. Most adults should get at least 150 minutes of moderate-intensity exercise (any activity that increases your heart rate and causes you to sweat) each week. In addition, most adults need muscle-strengthening exercises on 2 or more days a week.   Maintain a healthy weight. The body mass index (BMI) is a screening tool to identify possible weight problems. It provides an estimate of body  fat based on height and weight. Your caregiver can help determine your BMI, and can help you achieve or maintain a healthy weight. For adults 20 years and older:  A BMI below 18.5 is considered underweight.  A BMI of 18.5 to 24.9 is normal.  A BMI of 25 to 29.9 is considered overweight.  A BMI of 30 and above is considered obese.

## 2014-03-05 NOTE — Assessment & Plan Note (Signed)
[facility] and [facility] with [employer] . -continue current tx

## 2014-03-05 NOTE — Assessment & Plan Note (Signed)
HA1c on 11/2013 was normal.   -would not repeat today, [facility] and enrolled in [facility] DM program

## 2014-03-05 NOTE — Assessment & Plan Note (Signed)
UTD

## 2014-03-15 ENCOUNTER — Ambulatory Visit: Payer: Medicare Other

## 2014-03-22 ENCOUNTER — Telehealth: Payer: Self-pay | Admitting: Physical Therapy

## 2014-03-22 ENCOUNTER — Ambulatory Visit: Payer: Medicare Other | Attending: Orthopedic Surgery | Admitting: Physical Therapy

## 2014-03-22 DIAGNOSIS — R293 Abnormal posture: Secondary | ICD-10-CM | POA: Insufficient documentation

## 2014-03-22 DIAGNOSIS — M6281 Muscle weakness (generalized): Secondary | ICD-10-CM | POA: Insufficient documentation

## 2014-03-22 DIAGNOSIS — M797 Fibromyalgia: Secondary | ICD-10-CM | POA: Insufficient documentation

## 2014-03-22 DIAGNOSIS — M545 Low back pain: Secondary | ICD-10-CM | POA: Insufficient documentation

## 2014-03-22 DIAGNOSIS — Z5189 Encounter for other specified aftercare: Secondary | ICD-10-CM | POA: Insufficient documentation

## 2014-03-22 NOTE — Telephone Encounter (Signed)
Called to ask patient about recent no shows and cx

## 2014-03-23 NOTE — Addendum Note (Signed)
Addended by: Jones Bales on: 03/23/2014 01:59 PM   Modules accepted: Level of Service

## 2014-03-23 NOTE — Progress Notes (Signed)
INTERNAL MEDICINE TEACHING ATTENDING ADDENDUM - Selwyn Reason, MD: I reviewed and discussed at the time of visit with the resident Dr. Gill, the patient's medical history, physical examination, diagnosis and results of pertinent tests and treatment and I agree with the patient's care as documented.  

## 2014-03-25 ENCOUNTER — Encounter: Payer: Self-pay | Admitting: *Deleted

## 2014-03-31 ENCOUNTER — Telehealth: Payer: Self-pay | Admitting: *Deleted

## 2014-03-31 ENCOUNTER — Ambulatory Visit: Payer: Medicare Other

## 2014-03-31 DIAGNOSIS — M545 Low back pain, unspecified: Secondary | ICD-10-CM

## 2014-03-31 DIAGNOSIS — M2560 Stiffness of unspecified joint, not elsewhere classified: Secondary | ICD-10-CM

## 2014-03-31 DIAGNOSIS — M25651 Stiffness of right hip, not elsewhere classified: Secondary | ICD-10-CM

## 2014-03-31 DIAGNOSIS — R293 Abnormal posture: Secondary | ICD-10-CM | POA: Diagnosis not present

## 2014-03-31 DIAGNOSIS — M797 Fibromyalgia: Secondary | ICD-10-CM | POA: Diagnosis not present

## 2014-03-31 DIAGNOSIS — M6281 Muscle weakness (generalized): Secondary | ICD-10-CM | POA: Diagnosis not present

## 2014-03-31 DIAGNOSIS — M25652 Stiffness of left hip, not elsewhere classified: Secondary | ICD-10-CM

## 2014-03-31 DIAGNOSIS — Z5189 Encounter for other specified aftercare: Secondary | ICD-10-CM | POA: Diagnosis not present

## 2014-03-31 NOTE — Therapy (Signed)
[facility] [place], [place], [address] Phone: [phone]   Fax:  [phone]  Physical Therapy Evaluation  Patient Details  Name: Jordan [name] MRN: [id number] Date of Birth: January 12, 1981 Referring Provider:  Sinclair Ship,*  Encounter Date: 03/31/2014      PT End of Session - 03/31/14 1052    Visit Number 1   Number of Visits 12   Date for PT Re-Evaluation 05/12/14   PT Start Time 2703   PT Stop Time 1110   PT Time Calculation (min) 55 min   Activity Tolerance Patient tolerated treatment well   Behavior During Therapy [facility] for tasks assessed/performed      Past Medical History  Diagnosis Date  . Basal cell carcinoma     recurrent in left maxillary, has had 5 surguries, last one 2003  . Fibroma     left ovarian  . Cyst of nasal sinus   . Candidiasis, vagina     recurrent, pt has made lifestyle modifications and none recently (as of 8/12)  . Gardnerella infection     + in 08/08  . Whitlow     hx of herpetic requiring I+D,( was bitten by autistic child that cares fr  . Allergic rhinitis     pt takes flonase for episodes, no episodes in 2012  . PLANTAR FASCIITIS, BILATERAL 05/24/2009  . Paronychia of third finger of left hand 07/28/2010  . Fibromyalgia   . Herniated disc   . Anxiety   . GERD (gastroesophageal reflux disease)     occ  . OVARIAN CYSTECTOMY, HX OF 03/01/2006    ovarian fibroma-left 1996, L ovary and fallopian tube removed      Past Surgical History  Procedure Laterality Date  . Left oophorectomy    . Cholecystectomy    . Nasal sinus surgery      X 2 at age 4 & 18  . Mandible reconstruction  89    upper anfd lower jaw cyst  . Lumbar laminectomy/decompression microdiscectomy N/A 08/14/2012    Procedure: LUMBAR LAMINECTOMY/DECOMPRESSION MICRODISCECTOMY Lumbar 5 -sacrum 1 decompression;  Surgeon: Sinclair Ship, MD;  Location: [facility];  Service: Orthopedics;  Laterality: N/A;   Lumbar 5 -sacrum 1 decompression    LMP 02/02/2014  Visit Diagnosis:  Bilateral low back pain without sciatica - Plan: PT plan of care cert/re-cert  Joint stiffness of spine - Plan: PT plan of care cert/re-cert  Stiffness of joint of right pelvic region and thigh - Plan: PT plan of care cert/re-cert  Stiffness of joint of left pelvic region and thigh - Plan: PT plan of care cert/re-cert  Abnormal posture - Plan: PT plan of care cert/re-cert      Subjective Assessment - 03/31/14 1020    Symptoms Stiffness with pain on rotation of spine and bending foreward. Prior to 12/2013 she did not have pain with movements. Pain worse at night.    Pertinent History She has been seen in PT in past and was doing better when she began to have more pain in Nov 2015.      Limitations --  She is not doing normal activity.    How long can you sit comfortably? 45 min max   How long can you stand comfortably? 2 hours   How long can you walk comfortably? 10 min   Diagnostic tests None   Patient Stated Goals Reduce or eliminate pain   Currently in Pain? Yes   Pain Score 2  Pain Location Back   Pain Orientation Lower   Pain Descriptors / Indicators Aching   Pain Type Chronic pain   Pain Onset More than a month ago   Pain Frequency Constant   Aggravating Factors  Bending and twisting   Pain Relieving Factors Heat, TENS, Medication   Effect of Pain on Daily Activities All aspects, she is in bed alot   Multiple Pain Sites No          [facility] PT Assessment - 03/31/14 1028    Assessment   Medical Diagnosis LBP   Onset Date --  02/2013   Precautions   Precautions None   Restrictions   Weight Bearing Restrictions No   Balance Screen   Has the patient fallen in the past 6 months No   Has the patient had a decrease in activity level because of a fear of falling?  No   Is the patient reluctant to leave their home because of a fear of falling?  No   Prior Function   Level of Independence  Independent with basic ADLs   Observation/Other Assessments   Focus on Therapeutic Outcomes (FOTO)  48% limited with predictron of 40% limited   AROM   Overall AROM Comments She is tight in quads and hip flexors and with hip rotation in prone and these give some back pain also   Lumbar Flexion --  she can touch her mid tibia   Lumbar Extension decr 75%   Lumbar - Right Side Bend 30   Lumbar - Left Side Bend 30   Lumbar - Right Rotation 35   Lumbar - Left Rotation 40   Strength   Overall Strength Comments Grossly WNL in LE   Special Tests    Special Tests Hip Special Tests   Hip Special Tests  [employer] Test    Findings Positive   Side Right;Left                          PT Education - 03/31/14 1051    Education provided Yes   Education Details POC, use of cold, progression of walking   Person(s) Educated Patient   Methods Explanation;Handout   Comprehension Verbalized understanding          PT Short Term Goals - 03/31/14 1056    PT SHORT TERM GOAL #1   Title independent  with inital HEP   Time 3   Period Weeks   Status New   PT SHORT TERM GOAL #2   Title Pain reduced 30% or more with increased time with home activity   Time 3   Period Weeks   Status New   PT SHORT TERM GOAL #3   Title walk 15 min for exercise at home   Time 3   Period Weeks   Status New           PT Long Term Goals - 03/31/14 1057    PT LONG TERM GOAL #1   Title Independent with all HEP issued as of last visit   Time 6   Period Weeks   Status New   PT LONG TERM GOAL #2   Title Pain reduced 75% or more    Time 6   Period Weeks   Status New   PT LONG TERM GOAL #3   Title She will be walking 20 min for exercise    Time 6   Period Weeks  Status New   PT LONG TERM GOAL #4   Title She will report return to normal home tasks with min to no pain   Baseline t   Time 6   Period Weeks   Status New   PT LONG TERM GOAL #5   Title She will report able to  lye on stomach without pain   Time 6   Period Weeks   Status New               Plan - 04/29/2014 1053    Clinical Impression Statement She is tender in lower back and sacral areal .She has tighness in spine and hip flexors, rotators  and quads. Thes may be contributing to her pain   Pt will benefit from skilled therapeutic intervention in order to improve on the following deficits Decreased activity tolerance;Increased muscle spasms;Difficulty walking;Postural dysfunction;Impaired flexibility;Pain   Rehab Potential Good   PT Frequency 2x / week   PT Duration 6 weeks  if improving at 4 weeks   PT Treatment/Interventions Therapeutic exercise;Patient/family education;Passive range of motion;Manual techniques;Dry needling;Ultrasound;Cryotherapy;Moist Heat;Traction   PT Next Visit Plan STW, mobs, hip and quad stretching, MFR of hip, modalities PRN (she had a TENS unit for home use.    PT Home Exercise Plan Stretches for home   Consulted and Agree with Plan of Care Patient          G-Codes - 29-Apr-2014 1108    Functional Assessment Tool Used FOTO, predicted value was 40% so I moved goal to CJ   Functional Limitation Mobility: Walking and moving around;Changing and maintaining body position   Mobility: Walking and Moving Around Current Status [phone]) At least 40 percent but less than 60 percent impaired, limited or restricted   Mobility: Walking and Moving Around Goal Status [phone]) At least 20 percent but less than 40 percent impaired, limited or restricted       Problem List Patient Active Problem List   Diagnosis Date Noted  . At risk for abnormal blood glucose level 12/15/2013  . Preventative health care 12/19/2011  . Fibromyalgia 06/26/2010  . Chronic fatigue and depression 06/26/2010  . Upper respiratory infection 05/09/2010  . Obesity 01/17/2010    Class: Chronic  . Carpal tunnel syndrome 08/30/2008  . Lumbar disc herniation of L5-S1 on the left side (MRI 2012 s/p  surgery)  02/26/2008  . GERD 03/04/2006  . History of basal cell carcinoma excision 03/01/2006    Darrel Hoover  PT 2014-04-29, 11:10 AM  [facility] [address] [place], [place], [address] Phone: [phone]   Fax:  [phone]

## 2014-03-31 NOTE — Telephone Encounter (Signed)
appts made and printed...td 

## 2014-03-31 NOTE — Patient Instructions (Signed)
I asked her to increase walking time by 2 min every 7-10 days. Also to try cold pack at home 2x/day for 10-15 min for 3-4 days.  I used a apin with pelvis to inform on area of pain and possible causes.

## 2014-04-05 ENCOUNTER — Ambulatory Visit: Payer: Medicare Other

## 2014-04-12 ENCOUNTER — Encounter: Payer: Medicare Other | Admitting: Internal Medicine

## 2014-04-13 ENCOUNTER — Ambulatory Visit: Payer: Medicare Other

## 2014-04-13 DIAGNOSIS — M25651 Stiffness of right hip, not elsewhere classified: Secondary | ICD-10-CM

## 2014-04-13 DIAGNOSIS — M25652 Stiffness of left hip, not elsewhere classified: Secondary | ICD-10-CM

## 2014-04-13 DIAGNOSIS — M545 Low back pain, unspecified: Secondary | ICD-10-CM

## 2014-04-13 DIAGNOSIS — M797 Fibromyalgia: Secondary | ICD-10-CM | POA: Diagnosis not present

## 2014-04-13 DIAGNOSIS — M6281 Muscle weakness (generalized): Secondary | ICD-10-CM | POA: Diagnosis not present

## 2014-04-13 DIAGNOSIS — M2560 Stiffness of unspecified joint, not elsewhere classified: Secondary | ICD-10-CM

## 2014-04-13 DIAGNOSIS — R293 Abnormal posture: Secondary | ICD-10-CM | POA: Diagnosis not present

## 2014-04-13 DIAGNOSIS — Z5189 Encounter for other specified aftercare: Secondary | ICD-10-CM | POA: Diagnosis not present

## 2014-04-13 NOTE — Therapy (Signed)
[place], [place], [address] Phone: [phone]   Fax:  [phone]  Physical Therapy Treatment  Patient Details  Name: Jordan [name] MRN: [id number] Date of Birth: [birth year] Referring Provider:  Jones Bales, MD  Encounter Date: 04/13/2014      PT End of Session - 04/13/14 1019    Visit Number 2   Number of Visits 12   Date for PT Re-Evaluation 05/12/14   PT Start Time 0930   PT Stop Time 1025   PT Time Calculation (min) 55 min   Activity Tolerance Patient tolerated treatment well   Behavior During Therapy [facility] for tasks assessed/performed      Past Medical History  Diagnosis Date  . Basal cell carcinoma     recurrent in left maxillary, has had 5 surguries, last one 2003  . Fibroma     left ovarian  . Cyst of nasal sinus   . Candidiasis, vagina     recurrent, pt has made lifestyle modifications and none recently (as of 8/12)  . Gardnerella infection     + in 08/08  . [place]     hx of herpetic requiring I+D,( was bitten by autistic child that cares fr  . Allergic rhinitis     pt takes flonase for episodes, no episodes in 2012  . PLANTAR FASCIITIS, BILATERAL 05/24/2009  . Paronychia of third finger of left hand 07/28/2010  . Fibromyalgia   . Herniated disc   . Anxiety   . GERD (gastroesophageal reflux disease)     occ  . OVARIAN CYSTECTOMY, HX OF 03/01/2006    ovarian fibroma-left 1996, L ovary and fallopian tube removed      Past Surgical History  Procedure Laterality Date  . Left oophorectomy    . Cholecystectomy    . Nasal sinus surgery      X 2 at age 4 & 89  . Mandible reconstruction  92    upper anfd lower jaw cyst  . Lumbar laminectomy/decompression microdiscectomy N/A 08/14/2012    Procedure: LUMBAR LAMINECTOMY/DECOMPRESSION MICRODISCECTOMY Lumbar 5 -sacrum 1 decompression;  Surgeon: Sinclair Ship, MD;  Location: [place];  Service: Orthopedics;  Laterality: N/A;   Lumbar 5 -sacrum 1 decompression    There were no vitals taken for this visit.  Visit Diagnosis:  Bilateral low back pain without sciatica  Stiffness of joint of left pelvic region and thigh  Stiffness of joint of right pelvic region and thigh  Joint stiffness of spine      Subjective Assessment - 04/13/14 0938    Symptoms Questions about classess at [facility]. I advised to ask for easiest class to start   Currently in Pain? Yes   Pain Score 2    Pain Location Back   Multiple Pain Sites No                    [facility] Adult PT Treatment/Exercise - 04/13/14 0951    Lumbar Exercises: Stretches   Single Knee to Chest Stretch 2 reps;30 seconds  RT and Lt    Lower Trunk Rotation 2 reps;30 seconds  RT and LT   Lumbar Exercises: Aerobic   Stationary Bike L2 6 min   Modalities   Modalities Cryotherapy;Ultrasound   Cryotherapy   Number Minutes Cryotherapy 15 Minutes   Cryotherapy Location Back   Type of Cryotherapy Ice pack   Ultrasound   Ultrasound Location lower back   Ultrasound Parameters 100% 1.2 Wcm2, !  Mhz   Ultrasound Goals Pain   Manual Therapy   Manual Therapy --   Joint Mobilization PA glides Gr 3-4 mid thoracic to L3-5 done Gr 1-2   Passive ROM Thomas stretch RT and LT 60 sec                  PT Short Term Goals - 03/31/14 1056    PT SHORT TERM GOAL #1   Title independent  with inital HEP   Time 3   Period Weeks   Status New   PT SHORT TERM GOAL #2   Title Pain reduced 30% or more with increased time with home activity   Time 3   Period Weeks   Status New   PT SHORT TERM GOAL #3   Title walk 15 min for exercise at home   Time 3   Period Weeks   Status New           PT Long Term Goals - 03/31/14 1057    PT LONG TERM GOAL #1   Title Independent with all HEP issued as of last visit   Time 6   Period Weeks   Status New   PT LONG TERM GOAL #2   Title Pain reduced 75% or more    Time 6   Period Weeks   Status New   PT LONG  TERM GOAL #3   Title She will be walking 20 min for exercise    Time 6   Period Weeks   Status New   PT LONG TERM GOAL #4   Title She will report return to normal home tasks with min to no pain   Baseline t   Time 6   Period Weeks   Status New   PT LONG TERM GOAL #5   Title She will report able to lye on stomach without pain   Time 6   Period Weeks   Status New               Plan - 04/13/14 1020    Clinical Impression Statement Still tender in lower lumbar spine but she reports improvement with use of cold and walking 30 min at a time . She will sign up for silver sneakers and water exercise soon.    Consulted and Agree with Plan of Care Patient        Problem List Patient Active Problem List   Diagnosis Date Noted  . At risk for abnormal blood glucose level 12/15/2013  . Preventative health care 12/19/2011  . Fibromyalgia 06/26/2010  . Chronic fatigue and depression 06/26/2010  . Upper respiratory infection 05/09/2010  . Obesity 01/17/2010    Class: Chronic  . Carpal tunnel syndrome 08/30/2008  . Lumbar disc herniation of L5-S1 on the left side (MRI 2012 s/p surgery)  02/26/2008  . GERD 03/04/2006  . History of basal cell carcinoma excision 03/01/2006    Darrel Hoover PT 04/13/2014, 10:22 AM  [facility] [address] [place], [place], [address] Phone: [phone]   Fax:  [phone]

## 2014-04-15 ENCOUNTER — Ambulatory Visit: Payer: Medicare Other

## 2014-04-15 DIAGNOSIS — M545 Low back pain, unspecified: Secondary | ICD-10-CM

## 2014-04-15 DIAGNOSIS — M6281 Muscle weakness (generalized): Secondary | ICD-10-CM | POA: Diagnosis not present

## 2014-04-15 DIAGNOSIS — Z5189 Encounter for other specified aftercare: Secondary | ICD-10-CM | POA: Diagnosis not present

## 2014-04-15 DIAGNOSIS — R293 Abnormal posture: Secondary | ICD-10-CM

## 2014-04-15 DIAGNOSIS — M797 Fibromyalgia: Secondary | ICD-10-CM | POA: Diagnosis not present

## 2014-04-15 NOTE — Therapy (Signed)
[place], [place], [address] Phone: [phone]   Fax:  [phone]  Physical Therapy Treatment  Patient Details  Name: Jordan [name] MRN: [id number] Date of Birth: 11/04/1980 Referring Provider:  Jones Bales, MD  Encounter Date: 04/15/2014      PT End of Session - 04/15/14 1014    Visit Number 3   Number of Visits 12   Date for PT Re-Evaluation 05/12/14   PT Start Time 0930   PT Stop Time 1015   PT Time Calculation (min) 45 min   Activity Tolerance Patient tolerated treatment well  abdomenal soreness   Behavior During Therapy [facility] for tasks assessed/performed      Past Medical History  Diagnosis Date  . Basal cell carcinoma     recurrent in left maxillary, has had 5 surguries, last one 2003  . Fibroma     left ovarian  . Cyst of nasal sinus   . Candidiasis, vagina     recurrent, pt has made lifestyle modifications and none recently (as of 8/12)  . Gardnerella infection     + in 08/08  . [place]     hx of herpetic requiring I+D,( was bitten by autistic child that cares fr  . Allergic rhinitis     pt takes flonase for episodes, no episodes in 2012  . PLANTAR FASCIITIS, BILATERAL 05/24/2009  . Paronychia of third finger of left hand 07/28/2010  . Fibromyalgia   . Herniated disc   . Anxiety   . GERD (gastroesophageal reflux disease)     occ  . OVARIAN CYSTECTOMY, HX OF 03/01/2006    ovarian fibroma-left 1996, L ovary and fallopian tube removed      Past Surgical History  Procedure Laterality Date  . Left oophorectomy    . Cholecystectomy    . Nasal sinus surgery      X 2 at age 69 & 44  . Mandible reconstruction  92    upper anfd lower jaw cyst  . Lumbar laminectomy/decompression microdiscectomy N/A 08/14/2012    Procedure: LUMBAR LAMINECTOMY/DECOMPRESSION MICRODISCECTOMY Lumbar 5 -sacrum 1 decompression;  Surgeon: Sinclair Ship, MD;  Location: [place];  Service: Orthopedics;   Laterality: N/A;  Lumbar 5 -sacrum 1 decompression    There were no vitals taken for this visit.  Visit Diagnosis:  Bilateral low back pain without sciatica  Abnormal posture      Subjective Assessment - 04/15/14 0935    Symptoms No pain today. Sleeping better but feel more pops in back with movement   Currently in Pain? No/denies   Multiple Pain Sites No                    [facility] Adult PT Treatment/Exercise - 04/15/14 0936    Lumbar Exercises: Stretches   Single Knee to Chest Stretch 2 reps;30 seconds   Lower Trunk Rotation 2 reps;30 seconds   Lumbar Exercises: Supine   Bent Knee Raise 10 reps;5 seconds   Bent Knee Raise Limitations RT and LT with cues for position and pace.    Other Supine Lumbar Exercises Abdomnal set with shoulder hor abdcut/adduct with 3 pounds, flexion /extension  3 pounds,  arm circles  3 pounds x 12-15 RT and LT   Other Supine Lumbar Exercises EXT rotation with red band with abdominal set x12   Modalities   Modalities Moist Heat   Moist Heat Therapy   Number Minutes Moist Heat 15 Minutes  Moist Heat Location --  abdomen                  PT Short Term Goals - 03/31/14 1056    PT SHORT TERM GOAL #1   Title independent  with inital HEP   Time 3   Period Weeks   Status New   PT SHORT TERM GOAL #2   Title Pain reduced 30% or more with increased time with home activity   Time 3   Period Weeks   Status New   PT SHORT TERM GOAL #3   Title walk 15 min for exercise at home   Time 3   Period Weeks   Status New           PT Long Term Goals - 03/31/14 1057    PT LONG TERM GOAL #1   Title Independent with all HEP issued as of last visit   Time 6   Period Weeks   Status New   PT LONG TERM GOAL #2   Title Pain reduced 75% or more    Time 6   Period Weeks   Status New   PT LONG TERM GOAL #3   Title She will be walking 20 min for exercise    Time 6   Period Weeks   Status New   PT LONG TERM GOAL #4   Title She will  report return to normal home tasks with min to no pain   Baseline t   Time 6   Period Weeks   Status New   PT LONG TERM GOAL #5   Title She will report able to lye on stomach without pain   Time 6   Period Weeks   Status New               Plan - 04/15/14 1014    Clinical Impression Statement She needs more abdominal strength as program was not strenuous.    PT Next Visit Plan Continue stab exercises   Consulted and Agree with Plan of Care Patient        Problem List Patient Active Problem List   Diagnosis Date Noted  . At risk for abnormal blood glucose level 12/15/2013  . Preventative health care 12/19/2011  . Fibromyalgia 06/26/2010  . Chronic fatigue and depression 06/26/2010  . Upper respiratory infection 05/09/2010  . Obesity 01/17/2010    Class: Chronic  . Carpal tunnel syndrome 08/30/2008  . Lumbar disc herniation of L5-S1 on the left side (MRI 2012 s/p surgery)  02/26/2008  . GERD 03/04/2006  . History of basal cell carcinoma excision 03/01/2006    Darrel Hoover PT 04/15/2014, 10:17 AM  [facility] [address] [place], [place], [address] Phone: [phone]   Fax:  [phone]

## 2014-04-20 ENCOUNTER — Ambulatory Visit: Payer: Medicare Other | Attending: Orthopedic Surgery

## 2014-04-20 DIAGNOSIS — R293 Abnormal posture: Secondary | ICD-10-CM | POA: Insufficient documentation

## 2014-04-20 DIAGNOSIS — M797 Fibromyalgia: Secondary | ICD-10-CM | POA: Insufficient documentation

## 2014-04-20 DIAGNOSIS — M6281 Muscle weakness (generalized): Secondary | ICD-10-CM | POA: Insufficient documentation

## 2014-04-20 DIAGNOSIS — Z5189 Encounter for other specified aftercare: Secondary | ICD-10-CM | POA: Insufficient documentation

## 2014-04-20 DIAGNOSIS — M545 Low back pain: Secondary | ICD-10-CM | POA: Insufficient documentation

## 2014-04-22 ENCOUNTER — Ambulatory Visit: Payer: Medicare Other

## 2014-04-22 DIAGNOSIS — M545 Low back pain, unspecified: Secondary | ICD-10-CM

## 2014-04-22 DIAGNOSIS — Z5189 Encounter for other specified aftercare: Secondary | ICD-10-CM | POA: Diagnosis not present

## 2014-04-22 DIAGNOSIS — R293 Abnormal posture: Secondary | ICD-10-CM | POA: Diagnosis not present

## 2014-04-22 DIAGNOSIS — M6281 Muscle weakness (generalized): Secondary | ICD-10-CM

## 2014-04-22 DIAGNOSIS — M797 Fibromyalgia: Secondary | ICD-10-CM | POA: Diagnosis not present

## 2014-04-22 NOTE — Therapy (Signed)
[place], [place], [address] Phone: [phone]   Fax:  [phone]  Physical Therapy Treatment  Patient Details  Name: Jordan [name] MRN: [id number] Date of Birth: 1980/09/08 Referring Provider:  Jones Bales, MD  Encounter Date: 04/22/2014      PT End of Session - 04/22/14 0956    Visit Number 4   Number of Visits 12   Date for PT Re-Evaluation 05/12/14   PT Start Time 0920   PT Stop Time 0958   PT Time Calculation (min) 38 min   Activity Tolerance Patient tolerated treatment well   Behavior During Therapy [facility] for tasks assessed/performed      Past Medical History  Diagnosis Date  . Basal cell carcinoma     recurrent in left maxillary, has had 5 surguries, last one 2003  . Fibroma     left ovarian  . Cyst of nasal sinus   . Candidiasis, vagina     recurrent, pt has made lifestyle modifications and none recently (as of 8/12)  . Gardnerella infection     + in 08/08  . [place]     hx of herpetic requiring I+D,( was bitten by autistic child that cares fr  . Allergic rhinitis     pt takes flonase for episodes, no episodes in 2012  . PLANTAR FASCIITIS, BILATERAL 05/24/2009  . Paronychia of third finger of left hand 07/28/2010  . Fibromyalgia   . Herniated disc   . Anxiety   . GERD (gastroesophageal reflux disease)     occ  . OVARIAN CYSTECTOMY, HX OF 03/01/2006    ovarian fibroma-left 1996, L ovary and fallopian tube removed      Past Surgical History  Procedure Laterality Date  . Left oophorectomy    . Cholecystectomy    . Nasal sinus surgery      X 2 at age 72 & 37  . Mandible reconstruction  92    upper anfd lower jaw cyst  . Lumbar laminectomy/decompression microdiscectomy N/A 08/14/2012    Procedure: LUMBAR LAMINECTOMY/DECOMPRESSION MICRODISCECTOMY Lumbar 5 -sacrum 1 decompression;  Surgeon: Sinclair Ship, MD;  Location: [place];  Service: Orthopedics;  Laterality: N/A;   Lumbar 5 -sacrum 1 decompression    There were no vitals taken for this visit.  Visit Diagnosis:  Bilateral low back pain without sciatica  Abnormal posture  Weakness of trunk musculature      Subjective Assessment - 04/22/14 0958    Symptoms walked 45 min                     [facility] Adult PT Treatment/Exercise - 04/22/14 0931    Lumbar Exercises: Stretches   Single Knee to Chest Stretch 2 reps;30 seconds   Lower Trunk Rotation 2 reps;30 seconds   Lumbar Exercises: Aerobic   Stationary Bike Nustep L5 6 min.    Lumbar Exercises: Supine   Bent Knee Raise 10 reps;5 seconds   Bent Knee Raise Limitations RT and LT with cues for position and pace.    Other Supine Lumbar Exercises Abdominal set with shoulder hor abdcut/adduct with 4 pounds, flexion /extension  3 pounds,  arm circles  3 pounds x 12-15 RT and LT   Other Supine Lumbar Exercises EXT rotation with red band with abdominal set x12   Lumbar Exercises: Quadruped   Madcat/Old Horse 10 reps   Other Quadruped Lumbar Exercises Prone pressup stretch abdomen and also in sitting used therapy  ball to do abdominal stretch . She said she had a ball at home  she would stretch on.                 PT Education - 04/22/14 0955    Education provided Yes   Education Details abdomenal stretch   Person(s) Educated Patient   Methods Explanation;Demonstration;Verbal cues;Handout   Comprehension Verbalized understanding;Returned demonstration          PT Short Term Goals - 04/22/14 0958    PT SHORT TERM GOAL #1   Title independent  with inital HEP   Status Achieved   PT SHORT TERM GOAL #2   Title Pain reduced 30% or more with increased time with home activity   Status Achieved   PT SHORT TERM GOAL #3   Title walk 15 min for exercise at home   Status Achieved           PT Long Term Goals - 03/31/14 1057    PT LONG TERM GOAL #1   Title Independent with all HEP issued as of last visit   Time 6   Period  Weeks   Status New   PT LONG TERM GOAL #2   Title Pain reduced 75% or more    Time 6   Period Weeks   Status New   PT LONG TERM GOAL #3   Title She will be walking 20 min for exercise    Time 6   Period Weeks   Status New   PT LONG TERM GOAL #4   Title She will report return to normal home tasks with min to no pain   Baseline t   Time 6   Period Weeks   Status New   PT LONG TERM GOAL #5   Title She will report able to lye on stomach without pain   Time 6   Period Weeks   Status New               Plan - 04/22/14 0957    Clinical Impression Statement Continues with no back pain and did exercise today with less pain than last vist in abdomen.    PT Next Visit Plan Cont stab exercises   PT Home Exercise Plan Abdomenal stretching        Problem List Patient Active Problem List   Diagnosis Date Noted  . At risk for abnormal blood glucose level 12/15/2013  . Preventative health care 12/19/2011  . Fibromyalgia 06/26/2010  . Chronic fatigue and depression 06/26/2010  . Upper respiratory infection 05/09/2010  . Obesity 01/17/2010    Class: Chronic  . Carpal tunnel syndrome 08/30/2008  . Lumbar disc herniation of L5-S1 on the left side (MRI 2012 s/p surgery)  02/26/2008  . GERD 03/04/2006  . History of basal cell carcinoma excision 03/01/2006    Darrel Hoover PT 04/22/2014, 10:00 AM  [facility] [address] [place], [place], [address] Phone: [phone]   Fax:  [phone]

## 2014-04-22 NOTE — Patient Instructions (Signed)
USe of ball for abdomen stretching

## 2014-04-27 ENCOUNTER — Ambulatory Visit: Payer: Medicare Other

## 2014-04-27 DIAGNOSIS — M797 Fibromyalgia: Secondary | ICD-10-CM | POA: Diagnosis not present

## 2014-04-27 DIAGNOSIS — M79673 Pain in unspecified foot: Secondary | ICD-10-CM

## 2014-04-27 DIAGNOSIS — M6281 Muscle weakness (generalized): Secondary | ICD-10-CM

## 2014-04-27 DIAGNOSIS — R293 Abnormal posture: Secondary | ICD-10-CM | POA: Diagnosis not present

## 2014-04-27 DIAGNOSIS — Z5189 Encounter for other specified aftercare: Secondary | ICD-10-CM | POA: Diagnosis not present

## 2014-04-27 DIAGNOSIS — M545 Low back pain: Secondary | ICD-10-CM | POA: Diagnosis not present

## 2014-04-27 NOTE — Therapy (Signed)
[place], [place], [address] Phone: [phone]   Fax:  [phone]  Physical Therapy Treatment  Patient Details  Name: Jordan [name] MRN: [id number] Date of Birth: Sep 11, 1980 Referring Provider:  Jones Bales, MD  Encounter Date: 04/27/2014      PT End of Session - 04/27/14 1015    Visit Number 5   Number of Visits 12   Date for PT Re-Evaluation 05/12/14   PT Start Time 0929   PT Stop Time 1013   PT Time Calculation (min) 44 min   Activity Tolerance Patient tolerated treatment well   Behavior During Therapy [facility] for tasks assessed/performed      Past Medical History  Diagnosis Date  . Basal cell carcinoma     recurrent in left maxillary, has had 5 surguries, last one 2003  . Fibroma     left ovarian  . Cyst of nasal sinus   . Candidiasis, vagina     recurrent, pt has made lifestyle modifications and none recently (as of 8/12)  . Gardnerella infection     + in 08/08  . [place]     hx of herpetic requiring I+D,( was bitten by autistic child that cares fr  . Allergic rhinitis     pt takes flonase for episodes, no episodes in 2012  . PLANTAR FASCIITIS, BILATERAL 05/24/2009  . Paronychia of third finger of left hand 07/28/2010  . Fibromyalgia   . Herniated disc   . Anxiety   . GERD (gastroesophageal reflux disease)     occ  . OVARIAN CYSTECTOMY, HX OF 03/01/2006    ovarian fibroma-left 1996, L ovary and fallopian tube removed      Past Surgical History  Procedure Laterality Date  . Left oophorectomy    . Cholecystectomy    . Nasal sinus surgery      X 2 at age 23 & 65  . Mandible reconstruction  92    upper anfd lower jaw cyst  . Lumbar laminectomy/decompression microdiscectomy N/A 08/14/2012    Procedure: LUMBAR LAMINECTOMY/DECOMPRESSION MICRODISCECTOMY Lumbar 5 -sacrum 1 decompression;  Surgeon: Sinclair Ship, MD;  Location: [place];  Service: Orthopedics;  Laterality: N/A;   Lumbar 5 -sacrum 1 decompression    There were no vitals taken for this visit.  Visit Diagnosis:  Weakness of trunk musculature      Subjective Assessment - 04/27/14 0940    Symptoms No pain . Stopped ice and began to have soreness but started back so no pain now   Currently in Pain? No/denies                    [facility] [place] Adult PT Treatment/Exercise - 04/27/14 0940    Lumbar Exercises: Stretches   Single Knee to Chest Stretch 1 rep;30 seconds   Lower Trunk Rotation 1 rep;30 seconds   Lumbar Exercises: Aerobic   Stationary Bike Nustep L5 10 min.    Lumbar Exercises: Supine   Bridge 10 reps   Bridge Limitations shoulder bridge with cues for technique and segmental lift and lower.    Lumbar Exercises: Sidelying   Clam 10 reps;2 seconds   Clam Limitations RT and LT , Verbal and tactile cues   Hip Abduction 10 reps   Hip Abduction Weights (lbs) RT and LT with cues for technique   Lumbar Exercises: Prone   Other Prone Lumbar Exercises With abdominal set and bilateral arm lifts 2x5   Lumbar Exercises: Quadruped  Madcat/Old Horse 10 reps   Opposite Arm/Leg Raise Right arm/Left leg;Left arm/Right leg;10 reps;2 seconds   Other Quadruped Lumbar Exercises childs pose x2 30 seconds                PT Education - 04/27/14 1005    Education provided Yes   Education Details Shoulder bridge and prone arm lifts   Person(s) Educated Patient   Methods Explanation;Tactile cues;Verbal cues;Handout   Comprehension Returned demonstration          PT Short Term Goals - 04/22/14 0958    PT SHORT TERM GOAL #1   Title independent  with inital HEP   Status Achieved   PT SHORT TERM GOAL #2   Title Pain reduced 30% or more with increased time with home activity   Status Achieved   PT SHORT TERM GOAL #3   Title walk 15 min for exercise at home   Status Achieved           PT Long Term Goals - 03/31/14 1057    PT LONG TERM GOAL #1   Title Independent with all HEP  issued as of last visit   Time 6   Period Weeks   Status New   PT LONG TERM GOAL #2   Title Pain reduced 75% or more    Time 6   Period Weeks   Status New   PT LONG TERM GOAL #3   Title She will be walking 20 min for exercise    Time 6   Period Weeks   Status New   PT LONG TERM GOAL #4   Title She will report return to normal home tasks with min to no pain   Baseline t   Time 6   Period Weeks   Status New   PT LONG TERM GOAL #5   Title She will report able to lye on stomach without pain   Time 6   Period Weeks   Status New               Plan - 04/27/14 1016    Clinical Impression Statement She is tolerating new exercies but still gets some abdominal soreness. She is progressing   PT Next Visit Plan Review new HEP   PT Home Exercise Plan Core stab exercise   Consulted and Agree with Plan of Care Patient        Problem List Patient Active Problem List   Diagnosis Date Noted  . At risk for abnormal blood glucose level 12/15/2013  . Preventative health care 12/19/2011  . Fibromyalgia 06/26/2010  . Chronic fatigue and depression 06/26/2010  . Upper respiratory infection 05/09/2010  . Obesity 01/17/2010    Class: Chronic  . Carpal tunnel syndrome 08/30/2008  . Lumbar disc herniation of L5-S1 on the left side (MRI 2012 s/p surgery)  02/26/2008  . GERD 03/04/2006  . History of basal cell carcinoma excision 03/01/2006    Darrel Hoover PT 04/27/2014, 10:18 AM  [facility] [address] [place], [place], [address] Phone: [phone]   Fax:  [phone]

## 2014-04-27 NOTE — Patient Instructions (Signed)
instructed pt in shoulder bridge and handout issued. Also in prone lye with abdominal set and forehead on towel with bilateral arm lifts , shoulders and elbows at 90/901x/day 10 reps hold 2-3 sec

## 2014-04-29 ENCOUNTER — Ambulatory Visit: Payer: Medicare Other

## 2014-05-03 ENCOUNTER — Encounter: Payer: Self-pay | Admitting: Internal Medicine

## 2014-05-03 ENCOUNTER — Ambulatory Visit (INDEPENDENT_AMBULATORY_CARE_PROVIDER_SITE_OTHER): Payer: Medicare Other | Admitting: Internal Medicine

## 2014-05-03 VITALS — BP 95/59 | HR 60 | Temp 98.2°F | Ht 70.0 in | Wt 245.4 lb

## 2014-05-03 DIAGNOSIS — M797 Fibromyalgia: Secondary | ICD-10-CM

## 2014-05-03 DIAGNOSIS — Z85828 Personal history of other malignant neoplasm of skin: Secondary | ICD-10-CM

## 2014-05-03 DIAGNOSIS — E669 Obesity, unspecified: Secondary | ICD-10-CM

## 2014-05-03 DIAGNOSIS — Z9889 Other specified postprocedural states: Secondary | ICD-10-CM

## 2014-05-03 MED ORDER — PREGABALIN 100 MG PO CAPS: 100.0000 mg | ORAL_CAPSULE | Freq: Every day | ORAL | Status: DC

## 2014-05-03 MED ORDER — TRAMADOL HCL 50 MG PO TABS: 50.0000 mg | ORAL_TABLET | Freq: Two times a day (BID) | ORAL | Status: DC | PRN

## 2014-05-03 MED ORDER — CITALOPRAM HYDROBROMIDE 20 MG PO TABS: 20.0000 mg | ORAL_TABLET | Freq: Every day | ORAL | Status: DC

## 2014-05-03 NOTE — Assessment & Plan Note (Signed)
Pt with prior h/o basal cell carcinoma and follows with maxillofacial surgeon in [facility] yearly. -requested she follow up with the surgeon -->no evidence of lesions on exam today

## 2014-05-03 NOTE — Progress Notes (Signed)
Patient ID: Jordan [name], female   DOB: May 17, 1980, 34 y.o.   MRN: [id number]     Subjective:   Patient ID: Jordan [name] female    DOB: 12/07/1980 34 y.o.    MRN: [id number] Health Maintenance Due: There are no preventive care reminders to display for this patient.  _________________________________________________  HPI: [name] is a 34 y.o. female here for a routine visit.  Pt has a PMH outlined below.  Please see problem-based charting assessment and plan note for further details of medical issues addressed at today's visit.  PMH: Past Medical History  Diagnosis Date  . Basal cell carcinoma     recurrent in left maxillary, has had 5 surguries, last one 2003  . Fibroma     left ovarian  . Cyst of nasal sinus   . Candidiasis, vagina     recurrent, pt has made lifestyle modifications and none recently (as of 8/12)  . Gardnerella infection     + in 08/08  . [place]     hx of herpetic requiring I+D,( was bitten by autistic child that cares fr  . Allergic rhinitis     pt takes flonase for episodes, no episodes in 2012  . PLANTAR FASCIITIS, BILATERAL 05/24/2009  . Paronychia of third finger of left hand 07/28/2010  . Fibromyalgia   . Herniated disc   . Anxiety   . GERD (gastroesophageal reflux disease)     occ  . OVARIAN CYSTECTOMY, HX OF 03/01/2006    ovarian fibroma-left 1996, L ovary and fallopian tube removed      Medications: Current Outpatient Prescriptions on File Prior to Visit  Medication Sig Dispense Refill  . acetaminophen (TYLENOL ARTHRITIS PAIN) 650 MG CR tablet Take 650 mg by mouth every 8 (eight) hours as needed for pain.    . cholecalciferol (VITAMIN D) 1000 UNITS tablet Take 1,000 Units by mouth daily.    . citalopram (CELEXA) 40 MG tablet Take 1 tablet (40 mg total) by mouth daily. 30 tablet 3  . cyclobenzaprine (FLEXERIL) 5 MG tablet Take 1 tablet (5 mg total) by mouth 2 (two) times daily as needed for muscle spasms. 60 tablet 6  . meloxicam  (MOBIC) 15 MG tablet Take 15 mg by mouth as needed for pain.    . Multiple Vitamins-Minerals (ADULT ONE DAILY GUMMIES PO) Take 2 tablets by mouth daily.    . pregabalin (LYRICA) 150 MG capsule Take 1 capsule (150 mg total) by mouth at bedtime. 60 capsule 0  . PRESCRIPTION MEDICATION Place 1 drop into both eyes 2 (two) times daily as needed. Dry eyes    . traMADol (ULTRAM) 50 MG tablet Take by mouth 2 (two) times daily as needed.     No current facility-administered medications on file prior to visit.    Allergies: No Known Allergies  FH: Family History  Problem Relation Age of Onset  . Stomach cancer      uncle  . Breast cancer      two aunts  . Cancer      unknown grandfather  . Diabetes      grandmother, aunt and 2 uncles  . Diabetes Maternal Grandmother     SH: History   Social History  . Marital Status: Single    Spouse Name: N/A  . Number of Children: N/A  . Years of Education: N/A   Occupational History  . unemployed   . disability    Social History Main Topics  . Smoking  status: Never Smoker   . Smokeless tobacco: Not on file  . Alcohol Use: No  . Drug Use: No  . Sexual Activity: Not Currently    Birth Control/ Protection: None   Other Topics Concern  . Not on file   Social History Narrative   Lives in [place] [address] in house with partner    Review of Systems: Constitutional: Negative for fever, chills and weight loss.  Eyes: Negative for blurred vision.  Respiratory: Negative for cough and shortness of breath.  Cardiovascular: Negative for chest pain, palpitations and leg swelling.  Gastrointestinal: Negative for nausea, vomiting, abdominal pain, diarrhea, constipation and blood in stool.  Genitourinary: Negative for dysuria, urgency and frequency.  Musculoskeletal: +myalgias and back pain.  Neurological: Negative for dizziness, weakness and headaches.     Objective:   Vital Signs: There were no vitals filed for this visit.    BP Readings  from Last 3 Encounters:  03/05/14 105/71  02/08/14 102/60  01/05/14 103/66    Physical Exam: Constitutional: Vital signs reviewed.  Patient is in NAD and cooperative with exam.  Head: Normocephalic and atraumatic. Eyes: PERRL, EOMI, conjunctivae nl, no scleral icterus.  Neck: Supple. Cardiovascular: RRR, no MRG. Pulmonary/Chest: normal effort, CTAB, no wheezes, rales, or rhonchi. Abdominal: Soft. NT/ND +BS. Neurological: A&O x3, cranial nerves II-XII are grossly intact, moving all extremities. Extremities: 2+DP b/l; no pitting edema. Skin: Warm, dry and intact. No rash.   Assessment & Plan:   Assessment and plan was discussed and formulated with my attending.

## 2014-05-03 NOTE — Patient Instructions (Signed)
Thank you for your visit today.   Please return to the internal medicine clinic in 1-2 month(s) or sooner if needed.    You are doing great!!  I have cut your lyrica down to 100mg  daily at bedtime.  I also cut your citalopram to 20mg  daily.  I have given you a very limited short supply of tramadol to use only when in severe pain.    Please be sure to bring all of your medications with you to every visit; this includes herbal supplements, vitamins, eye drops, and any over-the-counter medications.   Should you have any questions regarding your medications and/or any new or worsening symptoms, please be sure to call the clinic at [phone].   If you believe that you are suffering from a life threatening condition or one that may result in the loss of limb or function, then you should call 911 or proceed to the nearest Emergency Department.     A healthy lifestyle and preventative care can promote health and wellness.   Maintain regular health, dental, and eye exams.  Eat a healthy diet. Foods like vegetables, fruits, whole grains, low-fat dairy products, and lean protein foods contain the nutrients you need without too many calories. Decrease your intake of foods high in solid fats, added sugars, and salt. Get information about a proper diet from your caregiver, if necessary.  Regular physical exercise is one of the most important things you can do for your health. Most adults should get at least 150 minutes of moderate-intensity exercise (any activity that increases your heart rate and causes you to sweat) each week. In addition, most adults need muscle-strengthening exercises on 2 or more days a week.   Maintain a healthy weight. The body mass index (BMI) is a screening tool to identify possible weight problems. It provides an estimate of body fat based on height and weight. Your caregiver can help determine your BMI, and can help you achieve or maintain a healthy weight. For adults 20  years and older:  A BMI below 18.5 is considered underweight.  A BMI of 18.5 to 24.9 is normal.  A BMI of 25 to 29.9 is considered overweight.  A BMI of 30 and above is considered obese.

## 2014-05-03 NOTE — Assessment & Plan Note (Addendum)
She continues to do well with weight loss and [facility]  She is down from 258-->245.4 lbs.   -congratulated her on this and encouraged her to continue

## 2014-05-03 NOTE — Assessment & Plan Note (Addendum)
Pt states she had a flare about 1.5 weeks ago when her 35 yo nephew was in the car with her and had a febrile seizure.  She complains of generalized pain especially in the hands and back.  She has cut back on her lyrica to 150mg  at bedtime and wishes to cut back further.  However, she requests a refill of tramadol today which I discussed with her that I felt it would be better to also cut back on the tramadol as well.  She agreed that she would.   -refill tramadol 50mg  q12 hrs PRN, #8 tabs given -->requested she use these very sparingly and only if in severe pain -->I would not refill again -decrease lyrica to 100mg  daily at bedtime -decrease citalopram to 20mg  daily  -continue with PT (goes twice weekly) and [facility]  -return to clinic in 1-2 months -continue with weight loss

## 2014-05-06 ENCOUNTER — Ambulatory Visit: Payer: Medicare Other | Admitting: Physical Therapy

## 2014-05-06 NOTE — Progress Notes (Signed)
Internal Medicine Clinic Attending Date of Visit: 05/03/2014  Case discussed with Dr. Gordy Levan soon after the resident saw the patient on the day of the visit.  We reviewed the resident's history and exam and pertinent patient test results.  I agree with the assessment, diagnosis, and plan of care documented in the resident's note.

## 2014-05-11 ENCOUNTER — Ambulatory Visit: Payer: Medicare Other

## 2014-05-13 ENCOUNTER — Ambulatory Visit: Payer: Medicare Other

## 2014-05-18 ENCOUNTER — Ambulatory Visit: Payer: Medicare Other

## 2014-05-20 ENCOUNTER — Ambulatory Visit: Payer: Medicare Other

## 2014-05-20 DIAGNOSIS — R293 Abnormal posture: Secondary | ICD-10-CM

## 2014-05-20 DIAGNOSIS — M545 Low back pain, unspecified: Secondary | ICD-10-CM

## 2014-05-20 DIAGNOSIS — M6281 Muscle weakness (generalized): Secondary | ICD-10-CM | POA: Diagnosis not present

## 2014-05-20 DIAGNOSIS — M797 Fibromyalgia: Secondary | ICD-10-CM | POA: Diagnosis not present

## 2014-05-20 DIAGNOSIS — Z5189 Encounter for other specified aftercare: Secondary | ICD-10-CM | POA: Diagnosis not present

## 2014-05-20 NOTE — Therapy (Signed)
[facility] [place], [place], [address] Phone: [phone]   Fax:  [phone]  Physical Therapy Treatment  Patient Details  Name: Jordan [name] MRN: [id number] Date of Birth: 1980/03/05 Referring Provider:  Jones Bales, MD  Encounter Date: 05/20/2014      PT End of Session - 05/20/14 1024    Visit Number 6   Number of Visits 12   Date for PT Re-Evaluation 06/10/14   PT Start Time 0942   PT Stop Time 1038   PT Time Calculation (min) 56 min   Activity Tolerance Patient limited by pain   Behavior During Therapy [facility] for tasks assessed/performed      Past Medical History  Diagnosis Date  . Basal cell carcinoma     recurrent in left maxillary, has had 5 surguries, last one 2003  . Fibroma     left ovarian  . Cyst of nasal sinus   . Candidiasis, vagina     recurrent, pt has made lifestyle modifications and none recently (as of 8/12)  . Gardnerella infection     + in 08/08  . [place]     hx of herpetic requiring I+D,( was bitten by autistic child that cares fr  . Allergic rhinitis     pt takes flonase for episodes, no episodes in 2012  . PLANTAR FASCIITIS, BILATERAL 05/24/2009  . Paronychia of third finger of left hand 07/28/2010  . Fibromyalgia   . Herniated disc   . Anxiety   . GERD (gastroesophageal reflux disease)     occ  . OVARIAN CYSTECTOMY, HX OF 03/01/2006    ovarian fibroma-left 1996, L ovary and fallopian tube removed      Past Surgical History  Procedure Laterality Date  . Left oophorectomy    . Cholecystectomy    . Nasal sinus surgery      X 2 at age 89 & 66  . Mandible reconstruction  89    upper anfd lower jaw cyst  . Lumbar laminectomy/decompression microdiscectomy N/A 08/14/2012    Procedure: LUMBAR LAMINECTOMY/DECOMPRESSION MICRODISCECTOMY Lumbar 5 -sacrum 1 decompression;  Surgeon: Sinclair Ship, MD;  Location: [place];  Service: Orthopedics;  Laterality: N/A;  Lumbar 5  -sacrum 1 decompression    There were no vitals filed for this visit.  Visit Diagnosis:  Bilateral low back pain without sciatica - Plan: PT plan of care cert/re-cert  Abnormal posture - Plan: PT plan of care cert/re-cert      Subjective Assessment - 05/20/14 0946    Symptoms She grabbed and lifted child on LT side. This occured  about 10 days ago . She has been doing stretches but no real benefit. Have catches and sharp pain at times.    Currently in Pain? Yes   Pain Score 5    Pain Location Back   Pain Orientation Left;Lower   Pain Type Acute pain   Pain Onset 1 to 4 weeks ago   Pain Frequency Constant   Aggravating Factors  all activity   Pain Relieving Factors ice and medication   Multiple Pain Sites No            [facility] PT Assessment - 05/20/14 0949    ROM / Strength   AROM / PROM / Strength PROM   AROM   Lumbar Flexion --  She can toouch her mid tibia with incr pain   Lumbar Extension decr 75%  more pain than flexion   Lumbar - Right  Side Bend 20   Lumbar - Left Side Bend 20   Lumbar - Right Rotation 35   Lumbar - Left Rotation 40   PROM   Overall PROM Comments Passive SLR painful bilaterla but can move to 60 degrees bilateral   Palpation   Palpation LT ASIS more proximal than RT                    [facility] Adult PT Treatment/Exercise - 05/20/14 0956    Lumbar Exercises: Stretches   Single Knee to Chest Stretch 1 rep;30 seconds   Lower Trunk Rotation 1 rep;30 seconds   Modalities   Modalities Electrical Stimulation   Moist Heat Therapy   Moist Heat Location --  back   Electrical Stimulation   Electrical Stimulation Location back   Electrical Stimulation Action IFC   Electrical Stimulation Parameters L11   Electrical Stimulation Goals Pain   Manual Therapy   Manual Therapy Joint mobilization;Manual Traction;Myofascial release;Other (comment)   Joint Mobilization PA Gr 1-2 L1-5   Myofascial Release LT hip flexors and piriformis /gluteals    Passive ROM LT hip rotation stretching and quad   Manual Traction LT leg long axis traction   Other Manual Therapy MET for possible LT posterior ilia with quad isometrics                  PT Short Term Goals - 05/20/14 1026    PT SHORT TERM GOAL #1   Title independent  with inital HEP   Status Achieved   PT SHORT TERM GOAL #2   Title Pain reduced 30% or more with increased time with home activity   Status On-going   PT SHORT TERM GOAL #3   Title walk 15 min for exercise at home   Status Achieved           PT Long Term Goals - 05/20/14 1026    PT LONG TERM GOAL #1   Title Independent with all HEP issued as of last visit   Status On-going   PT LONG TERM GOAL #2   Title Pain reduced 75% or more    Status On-going   PT LONG TERM GOAL #3   Title She will be walking 20 min for exercise    Status Achieved   PT LONG TERM GOAL #4   Title She will report return to normal home tasks with min to no pain   Status On-going   PT LONG TERM GOAL #5   Title She will report able to lye on stomach without pain   Status On-going               Plan - 05/20/14 1025    Clinical Impression Statement Flare up with significant increased pain and spasms.  Will do manual and modalities for next few treatments she may need to see MD and this was proposed to Jordan [name]   PT Duration 6 weeks  If MD agrees   PT Next Visit Plan Manual nd modalities   PT Home Exercise Plan cont  stretching   Consulted and Agree with Plan of Care Patient        Problem List Patient Active Problem List   Diagnosis Date Noted  . At risk for abnormal blood glucose level 12/15/2013  . Preventative health care 12/19/2011  . Fibromyalgia 06/26/2010  . Chronic fatigue and depression 06/26/2010  . Obesity 01/17/2010    Class: Chronic  . Carpal tunnel syndrome 08/30/2008  .  Lumbar disc herniation of L5-S1 on the left side (MRI 2012 s/p surgery)  02/26/2008  . GERD 03/04/2006  . History of basal  cell carcinoma excision 03/01/2006    Darrel Hoover PT 05/20/2014, 10:33 AM  [facility] [address] [place], [place], [address] Phone: [phone]   Fax:  [phone]

## 2014-05-25 DIAGNOSIS — M545 Low back pain: Secondary | ICD-10-CM | POA: Diagnosis not present

## 2014-06-09 ENCOUNTER — Ambulatory Visit: Payer: Medicare Other

## 2014-06-10 ENCOUNTER — Ambulatory Visit: Payer: Medicare Other

## 2014-06-14 ENCOUNTER — Ambulatory Visit: Payer: Medicare Other | Attending: Orthopedic Surgery

## 2014-06-14 DIAGNOSIS — M6281 Muscle weakness (generalized): Secondary | ICD-10-CM | POA: Insufficient documentation

## 2014-06-14 DIAGNOSIS — Z5189 Encounter for other specified aftercare: Secondary | ICD-10-CM | POA: Diagnosis not present

## 2014-06-14 DIAGNOSIS — R293 Abnormal posture: Secondary | ICD-10-CM | POA: Diagnosis not present

## 2014-06-14 DIAGNOSIS — M797 Fibromyalgia: Secondary | ICD-10-CM | POA: Insufficient documentation

## 2014-06-14 DIAGNOSIS — M545 Low back pain, unspecified: Secondary | ICD-10-CM

## 2014-06-14 NOTE — Therapy (Signed)
[place], [place], [address] Phone: [phone]   Fax:  [phone]  Physical Therapy Treatment  Patient Details  Name: Jordan [name] MRN: [id number] Date of Birth: 1980/05/15 Referring Provider:  Phylliss Bob, MD  Encounter Date: 06/14/2014      PT End of Session - 06/14/14 1057    Visit Number 7   Number of Visits 17   Date for PT Re-Evaluation 07/16/14   PT Start Time 1030   PT Stop Time 1115   PT Time Calculation (min) 45 min   Activity Tolerance Patient tolerated treatment well   Behavior During Therapy [facility] for tasks assessed/performed      Past Medical History  Diagnosis Date  . Basal cell carcinoma     recurrent in left maxillary, has had 5 surguries, last one 2003  . Fibroma     left ovarian  . Cyst of nasal sinus   . Candidiasis, vagina     recurrent, pt has made lifestyle modifications and none recently (as of 8/12)  . Gardnerella infection     + in 08/08  . [place]     hx of herpetic requiring I+D,( was bitten by autistic child that cares fr  . Allergic rhinitis     pt takes flonase for episodes, no episodes in 2012  . PLANTAR FASCIITIS, BILATERAL 05/24/2009  . Paronychia of third finger of left hand 07/28/2010  . Fibromyalgia   . Herniated disc   . Anxiety   . GERD (gastroesophageal reflux disease)     occ  . OVARIAN CYSTECTOMY, HX OF 03/01/2006    ovarian fibroma-left 1996, L ovary and fallopian tube removed      Past Surgical History  Procedure Laterality Date  . Left oophorectomy    . Cholecystectomy    . Nasal sinus surgery      X 2 at age 79 & 17  . Mandible reconstruction  89    upper anfd lower jaw cyst  . Lumbar laminectomy/decompression microdiscectomy N/A 08/14/2012    Procedure: LUMBAR LAMINECTOMY/DECOMPRESSION MICRODISCECTOMY Lumbar 5 -sacrum 1 decompression;  Surgeon: Sinclair Ship, MD;  Location: [place];  Service: Orthopedics;  Laterality: N/A;   Lumbar 5 -sacrum 1 decompression    There were no vitals filed for this visit.  Visit Diagnosis:  Bilateral low back pain without sciatica - Plan: PT plan of care cert/re-cert  Abnormal posture - Plan: PT plan of care cert/re-cert      Subjective Assessment - 06/14/14 1033    Subjective She continues to have pain but is better. She saw Dr Lynann Bologna and he felt inflammed an d se took steroida and Education officer, environmental.    Currently in Pain? Yes   Pain Score 3    Pain Location Back   Pain Orientation Posterior;Left;Right   Pain Descriptors / Indicators Aching   Pain Type --  Sub acute   Pain Onset More than a month ago   Pain Frequency Constant   Aggravating Factors  Sharper pain in bed and with turning.    Pain Relieving Factors Medication.eases to 3/10.    Effect of Pain on Daily Activities Moving more and able to do ligh house work and vaccum . She is walking a mile.  She is not able to do 2 miles. Est inPM disturbed . takes more breaks   Multiple Pain Sites No            [facility] PT Assessment - 06/14/14 1038  AROM   Lumbar Flexion she can touch proximal tibias   Lumbar Extension 10 degrees   Lumbar - Right Side Bend 10 degrees   Lumbar - Left Side Bend 10 degrees   Lumbar - Right Rotation 40   Lumbar - Left Rotation 40   PROM   Overall PROM Comments Passive SLR painful bilaterlal but can move to 60 degrees bilateral. Hip mobility WFL with tight hip flexors bilaterally   Strength   Overall Strength Comments Grossly WNL in LE   Thomas Test    Findings Positive   Side Right;Left                     [facility] Adult PT Treatment/Exercise - 06/14/14 1056    Lumbar Exercises: Quadruped   [place] Horse 15 reps   Other Quadruped Lumbar Exercises sidebend stretch. x5  with tactile cues   Moist Heat Therapy   Number Minutes Moist Heat 20 Minutes   Moist Heat Location --  back   Electrical Stimulation   Electrical Stimulation Location back   Electrical  Stimulation Action IFC   Electrical Stimulation Parameters L8   Electrical Stimulation Goals Pain                  PT Short Term Goals - 06/14/14 1100    PT SHORT TERM GOAL #1   Title independent  with inital HEP   Status Achieved   PT SHORT TERM GOAL #2   Title Pain reduced 30% or more with increased time with home activity   Status Achieved   PT SHORT TERM GOAL #3   Title walk 15 min for exercise at home   Status Achieved           PT Long Term Goals - 06/14/14 1101    PT LONG TERM GOAL #1   Title Independent with all HEP issued as of last visit   Status On-going   PT LONG TERM GOAL #2   Title Pain reduced 75% or more    Status On-going   PT LONG TERM GOAL #3   Title She will be walking 20 min for exercise    Status Achieved   PT LONG TERM GOAL #4   Title She will report return to normal home tasks with min to no pain   Status On-going   PT LONG TERM GOAL #5   Title She will report able to lye on stomach without pain   Status On-going               Plan - 06/14/14 1058    Clinical Impression Statement She is improved since last visit but sintinues with pain.   I would like to see for 4-5 weeks/8-10 visits and if improveing continue but if no different return to MD.    Pt will benefit from skilled therapeutic intervention in order to improve on the following deficits Decreased activity tolerance;Increased muscle spasms;Difficulty walking;Postural dysfunction;Impaired flexibility;Pain   Rehab Potential Good   PT Frequency 2x / week   PT Duration 6 weeks   PT Treatment/Interventions Therapeutic exercise;Patient/family education;Passive range of motion;Manual techniques;Dry needling;Ultrasound;Cryotherapy;Moist Heat;Traction   PT Next Visit Plan Manual nd modalities   PT Home Exercise Plan cont  stretching   Consulted and Agree with Plan of Care Patient        Problem List Patient Active Problem List   Diagnosis Date Noted  . At risk for  abnormal blood glucose level 12/15/2013  . Preventative  health care 12/19/2011  . Fibromyalgia 06/26/2010  . Chronic fatigue and depression 06/26/2010  . Obesity 01/17/2010    Class: Chronic  . Carpal tunnel syndrome 08/30/2008  . Lumbar disc herniation of L5-S1 on the left side (MRI 2012 s/p surgery)  02/26/2008  . GERD 03/04/2006  . History of basal cell carcinoma excision 03/01/2006    Darrel Hoover PT 06/14/2014, 11:03 AM  [facility] [address] [place], [place], [address] Phone: [phone]   Fax:  [phone]

## 2014-06-16 ENCOUNTER — Ambulatory Visit: Payer: Medicare Other

## 2014-06-16 DIAGNOSIS — R293 Abnormal posture: Secondary | ICD-10-CM

## 2014-06-16 DIAGNOSIS — M25652 Stiffness of left hip, not elsewhere classified: Secondary | ICD-10-CM

## 2014-06-16 DIAGNOSIS — M2560 Stiffness of unspecified joint, not elsewhere classified: Secondary | ICD-10-CM

## 2014-06-16 DIAGNOSIS — M6281 Muscle weakness (generalized): Secondary | ICD-10-CM | POA: Diagnosis not present

## 2014-06-16 DIAGNOSIS — M797 Fibromyalgia: Secondary | ICD-10-CM | POA: Diagnosis not present

## 2014-06-16 DIAGNOSIS — M545 Low back pain, unspecified: Secondary | ICD-10-CM

## 2014-06-16 DIAGNOSIS — M25651 Stiffness of right hip, not elsewhere classified: Secondary | ICD-10-CM

## 2014-06-16 DIAGNOSIS — Z5189 Encounter for other specified aftercare: Secondary | ICD-10-CM | POA: Diagnosis not present

## 2014-06-16 NOTE — Therapy (Signed)
[facility] [address] [place], [place], [address] Phone: [phone]   Fax:  [phone]  Physical Therapy Treatment  Patient Details  Name: Jordan [name] MRN: [id number] Date of Birth: 06/10/1980 Referring Provider:  Dumonski, Mark, MD  Encounter Date: 06/16/2014      PT End of Session - 06/16/14 1047    Visit Number 8   Number of Visits 17   Date for PT Re-Evaluation 07/16/14   PT Start Time 0955   PT Stop Time 1100   PT Time Calculation (min) 65 min   Activity Tolerance Patient tolerated treatment well   Behavior During Therapy WFL for tasks assessed/performed      Past Medical History  Diagnosis Date  . Basal cell carcinoma     recurrent in left maxillary, has had 5 surguries, last one 2003  . Fibroma     left ovarian  . Cyst of nasal sinus   . Candidiasis, vagina     recurrent, pt has made lifestyle modifications and none recently (as of 8/12)  . Gardnerella infection     + in 08/08  . Whitlow     hx of herpetic requiring I+D,( was bitten by autistic child that cares fr  . Allergic rhinitis     pt takes flonase for episodes, no episodes in 2012  . PLANTAR FASCIITIS, BILATERAL 05/24/2009  . Paronychia of third finger of left hand 07/28/2010  . Fibromyalgia   . Herniated disc   . Anxiety   . GERD (gastroesophageal reflux disease)     occ  . OVARIAN CYSTECTOMY, HX OF 03/01/2006    ovarian fibroma-left 1996, L ovary and fallopian tube removed      Past Surgical History  Procedure Laterality Date  . Left oophorectomy    . Cholecystectomy    . Nasal sinus surgery      X 2 at age 12 & 19  . Mandible reconstruction  92    upper anfd lower jaw cyst  . Lumbar laminectomy/decompression microdiscectomy N/A 08/14/2012    Procedure: LUMBAR LAMINECTOMY/DECOMPRESSION MICRODISCECTOMY Lumbar 5 -sacrum 1 decompression;  Surgeon: Mark Leonard Dumonski, MD;  Location: [facility];  Service: Orthopedics;  Laterality: N/A;   Lumbar 5 -sacrum 1 decompression    There were no vitals filed for this visit.  Visit Diagnosis:  Bilateral low back pain without sciatica  Abnormal posture  Joint stiffness of spine  Stiffness of joint of right pelvic region and thigh  Stiffness of joint of left pelvic region and thigh      Subjective Assessment - 06/16/14 0959    Subjective Walking losens me up now.     Currently in Pain? Yes   Pain Score 3    Pain Location Back   Pain Orientation Left;Right;Posterior   Pain Descriptors / Indicators Aching   Pain Type --  sub acute   Pain Onset More than a month ago   Pain Frequency Constant   Aggravating Factors  turning in bed   Pain Relieving Factors meds , walking   Multiple Pain Sites No                         [facility] Adult PT Treatment/Exercise - 06/16/14 1001    Lumbar Exercises: Stretches   Single Knee to Chest Stretch 2 reps;30 seconds   Lower Trunk Rotation 2 reps;30 seconds   Lumbar Exercises: Quadruped   Madcat/Old Horse 10 reps   Other Quadruped Lumbar   Exercises sidebend stretch. x5  with tactile cues, and child's pose x2 25 sec with gentle overpressure   Moist Heat Therapy   Number Minutes Moist Heat 18 Minutes   Moist Heat Location --  back   Electrical Stimulation   Electrical Stimulation Location back   Electrical Stimulation Action IFC   Electrical Stimulation Parameters L12   Electrical Stimulation Goals Pain   Manual Therapy   Joint Mobilization Gr 2-3 PA glides L1 to L5   Myofascial Release Stretching LT and RT glueals , quads and hip flexors in side lye and prone and supine                PT Education - 06/16/14 1047    Education provided Yes   Education Details Thmas stretch and hoe this affects back .    Person(s) Educated Patient   Methods Explanation;Demonstration;Tactile cues;Verbal cues;Handout  pictures   Comprehension Returned demonstration;Verbalized understanding          PT Short Term Goals -  06/14/14 1100    PT SHORT TERM GOAL #1   Title independent  with inital HEP   Status Achieved   PT SHORT TERM GOAL #2   Title Pain reduced 30% or more with increased time with home activity   Status Achieved   PT SHORT TERM GOAL #3   Title walk 15 min for exercise at home   Status Achieved           PT Long Term Goals - 06/14/14 1101    PT LONG TERM GOAL #1   Title Independent with all HEP issued as of last visit   Status On-going   PT LONG TERM GOAL #2   Title Pain reduced 75% or more    Status On-going   PT LONG TERM GOAL #3   Title She will be walking 20 min for exercise    Status Achieved   PT LONG TERM GOAL #4   Title She will report return to normal home tasks with min to no pain   Status On-going   PT LONG TERM GOAL #5   Title She will report able to lye on stomach without pain   Status On-going               Plan - 06/16/14 1048    Clinical Impression Statement She is doing well and moving better She feels loser after stretching . No goals met yet due to flare up   PT Next Visit Plan Manual and modalities   PT Home Exercise Plan cont  stretching   Consulted and Agree with Plan of Care Patient        Problem List Patient Active Problem List   Diagnosis Date Noted  . At risk for abnormal blood glucose level 12/15/2013  . Preventative health care 12/19/2011  . Fibromyalgia 06/26/2010  . Chronic fatigue and depression 06/26/2010  . Obesity 01/17/2010    Class: Chronic  . Carpal tunnel syndrome 08/30/2008  . Lumbar disc herniation of L5-S1 on the left side (MRI 2012 s/p surgery)  02/26/2008  . GERD 03/04/2006  . History of basal cell carcinoma excision 03/01/2006    Darrel Hoover PT 06/16/2014, 10:50 AM  [facility] [address] [place], [place], [address] Phone: [phone]   Fax:  [phone]

## 2014-06-16 NOTE — Patient Instructions (Signed)
Hip Flexors (Supine)   Lie with both legs bent over edge of firm surface. To stretch left hip, bring opposite knee to chest. Apply downward pressure to leg hanging over edge. Do not allow hips to roll up. Do not let knees change position. Hold 30-120____ seconds. Repeat __1-3__ times. Do __2__ sessions per day. CAUTION: Stretch should be gentle, steady and slow.  Copyright  VHI. All rights reserved.

## 2014-06-22 ENCOUNTER — Other Ambulatory Visit: Payer: Self-pay | Admitting: Internal Medicine

## 2014-06-29 ENCOUNTER — Ambulatory Visit: Payer: Medicare Other

## 2014-07-02 ENCOUNTER — Ambulatory Visit: Payer: Medicare Other | Attending: Orthopedic Surgery

## 2014-07-02 DIAGNOSIS — M545 Low back pain: Secondary | ICD-10-CM | POA: Insufficient documentation

## 2014-07-02 DIAGNOSIS — M797 Fibromyalgia: Secondary | ICD-10-CM | POA: Insufficient documentation

## 2014-07-02 DIAGNOSIS — Z5189 Encounter for other specified aftercare: Secondary | ICD-10-CM | POA: Insufficient documentation

## 2014-07-02 DIAGNOSIS — M6281 Muscle weakness (generalized): Secondary | ICD-10-CM | POA: Insufficient documentation

## 2014-07-02 DIAGNOSIS — R293 Abnormal posture: Secondary | ICD-10-CM | POA: Insufficient documentation

## 2014-07-03 DIAGNOSIS — M545 Low back pain: Secondary | ICD-10-CM | POA: Diagnosis not present

## 2014-07-06 ENCOUNTER — Ambulatory Visit: Payer: Medicare Other

## 2014-07-08 ENCOUNTER — Ambulatory Visit: Payer: Medicare Other

## 2014-07-08 DIAGNOSIS — M6281 Muscle weakness (generalized): Secondary | ICD-10-CM | POA: Diagnosis not present

## 2014-07-08 DIAGNOSIS — M797 Fibromyalgia: Secondary | ICD-10-CM | POA: Diagnosis not present

## 2014-07-08 DIAGNOSIS — Z5189 Encounter for other specified aftercare: Secondary | ICD-10-CM | POA: Diagnosis not present

## 2014-07-08 DIAGNOSIS — M545 Low back pain: Secondary | ICD-10-CM

## 2014-07-08 DIAGNOSIS — R293 Abnormal posture: Secondary | ICD-10-CM | POA: Diagnosis not present

## 2014-07-08 NOTE — Therapy (Addendum)
[place], [place], [address] Phone: [phone]   Fax:  [phone]  Physical Therapy Treatment  Patient Details  Name: Jordan [name] MRN: [id number] Date of Birth: 1980-11-18 Referring Provider:  Jones Bales, MD  Encounter Date: 07/08/2014      PT End of Session - 07/08/14 1455    Visit Number 9   Number of Visits 17   Date for PT Re-Evaluation 07/16/14   PT Start Time 0215   PT Stop Time 0240   PT Time Calculation (min) 25 min   Activity Tolerance Patient tolerated treatment well;Patient limited by pain   Behavior During Therapy [facility] for tasks assessed/performed      Past Medical History  Diagnosis Date  . Basal cell carcinoma     recurrent in left maxillary, has had 5 surguries, last one 2003  . Fibroma     left ovarian  . Cyst of nasal sinus   . Candidiasis, vagina     recurrent, pt has made lifestyle modifications and none recently (as of 8/12)  . Gardnerella infection     + in 08/08  . [place]     hx of herpetic requiring I+D,( was bitten by autistic child that cares fr  . Allergic rhinitis     pt takes flonase for episodes, no episodes in 2012  . PLANTAR FASCIITIS, BILATERAL 05/24/2009  . Paronychia of third finger of left hand 07/28/2010  . Fibromyalgia   . Herniated disc   . Anxiety   . GERD (gastroesophageal reflux disease)     occ  . OVARIAN CYSTECTOMY, HX OF 03/01/2006    ovarian fibroma-left 1996, L ovary and fallopian tube removed      Past Surgical History  Procedure Laterality Date  . Left oophorectomy    . Cholecystectomy    . Nasal sinus surgery      X 2 at age 40 & 64  . Mandible reconstruction  92    upper anfd lower jaw cyst  . Lumbar laminectomy/decompression microdiscectomy N/A 08/14/2012    Procedure: LUMBAR LAMINECTOMY/DECOMPRESSION MICRODISCECTOMY Lumbar 5 -sacrum 1 decompression;  Surgeon: Sinclair Ship, MD;  Location: [facility];  Service:  Orthopedics;  Laterality: N/A;  Lumbar 5 -sacrum 1 decompression    There were no vitals filed for this visit.  Visit Diagnosis:  Bilateral low back pain, with sciatica presence unspecified      Subjective Assessment - 07/08/14 1421    Subjective Took a turn for worse. I have been falling and now use a cane.     Currently in Pain? Yes   Pain Score 6    Pain Location Back   Pain Orientation Left;Posterior   in RT also   Pain Descriptors / Indicators Burning   Pain Type Acute pain   Pain Radiating Towards LT and goes to RT also   Pain Onset 1 to 4 weeks ago   Pain Frequency Constant   Aggravating Factors  nothing as it hurts all the time   Pain Relieving Factors MEds and TENS   Multiple Pain Sites No            [facility] PT Assessment - 07/08/14 1426    PROM   Overall PROM Comments Bilateral SLR and knee to chest painful Pt crying. .    Strength   Overall Strength Comments Grossly WNL in thighs and bleow knee with with report of pain with testing   [employer]  Findings Positive   Side Left;Right   Ambulation/Gait   Ambulation/Gait Yes   Ambulation/Gait Assistance 6: Modified independent (Device/Increase time)   Assistive device Straight cane   Gait Pattern Decreased stance time - left;Decreased stride length;Antalgic                     [facility] Adult PT Treatment/Exercise - 07/08/14 1426    Manual Therapy   Manual Traction LT leg long axis traction gve some relief from apin  down to 3/10 but did not eliminate pain.                     PT Short Term Goals - 06/14/14 1100    PT SHORT TERM GOAL #1   Title independent  with inital HEP   Status Achieved   PT SHORT TERM GOAL #2   Title Pain reduced 30% or more with increased time with home activity   Status Achieved   PT SHORT TERM GOAL #3   Title walk 15 min for exercise at home   Status Achieved           PT Long Term Goals - 07/08/14 1458    PT LONG TERM GOAL #1   Title Independent  with all HEP issued as of last visit   Status On-going   PT LONG TERM GOAL #2   Title Pain reduced 75% or more    Status On-going   PT LONG TERM GOAL #3   Title She will be walking 20 min for exercise    Status On-going   PT LONG TERM GOAL #4   Title She will report return to normal home tasks with min to no pain   Status On-going   PT LONG TERM GOAL #5   Title She will report able to lye on stomach without pain   Status On-going     G-Code Goal CJ  Discharge CK          Plan - 07/08/14 1455    Clinical Impression Statement Manual traction eased pain but only temporarily. She is unable to do any exercise and stretching increases leg pain or hip pain with all motions Lt leg and some of RT . SLR + She will see MD next week to follow up with  MRI and she will let us know if the plan fo treatment cchanges   PT Next Visit Plan Wait to see results of MRI and if PT appropriate   Consulted and Agree with Plan of Care Patient        Problem List Patient Active Problem List   Diagnosis Date Noted  . At risk for abnormal blood glucose level 12/15/2013  . Preventative health care 12/19/2011  . Fibromyalgia 06/26/2010  . Chronic fatigue and depression 06/26/2010  . Obesity 01/17/2010    Class: Chronic  . Carpal tunnel syndrome 08/30/2008  . Lumbar disc herniation of L5-S1 on the left side (MRI 2012 s/p surgery)  02/26/2008  . GERD 03/04/2006  . History of basal cell carcinoma excision 03/01/2006    Darrel Hoover PT 07/08/2014, 2:59 PM  [facility] [address] [place], [place], [address] Phone: [phone]   Fax:  [phone]     PHYSICAL THERAPY DISCHARGE SUMMARY  Visits from Start of Care: 9  Current functional level related to goals / functional outcomes: See above   Remaining deficits: Unknown   Education / Equipment: HEP  Plan: Patient agrees to  discharge.  Patient goals were not met. Patient is  being discharged due to a change in medical status.  ?????   Darrel Hoover, PT   12/29/14  10:53 AM

## 2014-07-12 DIAGNOSIS — M545 Low back pain: Secondary | ICD-10-CM | POA: Diagnosis not present

## 2014-07-16 DIAGNOSIS — M5416 Radiculopathy, lumbar region: Secondary | ICD-10-CM | POA: Diagnosis not present

## 2014-07-21 DIAGNOSIS — M5416 Radiculopathy, lumbar region: Secondary | ICD-10-CM | POA: Diagnosis not present

## 2014-08-02 ENCOUNTER — Encounter: Payer: Medicare Other | Admitting: Internal Medicine

## 2014-08-02 ENCOUNTER — Telehealth: Payer: Self-pay | Admitting: Pulmonary Disease

## 2014-08-02 NOTE — Telephone Encounter (Signed)
Call to patient to confirm appointment for 08/03/14 at 8:15 lmtcb

## 2014-08-03 ENCOUNTER — Ambulatory Visit (INDEPENDENT_AMBULATORY_CARE_PROVIDER_SITE_OTHER): Payer: Medicare Other | Admitting: Pulmonary Disease

## 2014-08-03 ENCOUNTER — Other Ambulatory Visit (HOSPITAL_COMMUNITY)
Admission: RE | Admit: 2014-08-03 | Discharge: 2014-08-03 | Disposition: A | Discharge status: Home / Self Care | Payer: Medicare Other | Source: Ambulatory Visit | Attending: Internal Medicine | Admitting: Internal Medicine

## 2014-08-03 ENCOUNTER — Encounter: Payer: Self-pay | Admitting: Pulmonary Disease

## 2014-08-03 VITALS — BP 94/66 | HR 80 | Temp 98.1°F | Ht 70.0 in | Wt 246.8 lb

## 2014-08-03 DIAGNOSIS — Z113 Encounter for screening for infections with a predominantly sexual mode of transmission: Secondary | ICD-10-CM | POA: Diagnosis not present

## 2014-08-03 DIAGNOSIS — M797 Fibromyalgia: Secondary | ICD-10-CM

## 2014-08-03 DIAGNOSIS — N760 Acute vaginitis: Secondary | ICD-10-CM | POA: Diagnosis not present

## 2014-08-03 DIAGNOSIS — N898 Other specified noninflammatory disorders of vagina: Secondary | ICD-10-CM

## 2014-08-03 MED ORDER — PREGABALIN 100 MG PO CAPS: 100.0000 mg | ORAL_CAPSULE | Freq: Every day | ORAL | Status: DC

## 2014-08-03 NOTE — Progress Notes (Signed)
Internal Medicine Clinic Attending  Case discussed with Dr. Krall at the time of the visit.  We reviewed the resident's history and exam and pertinent patient test results.  I agree with the assessment, diagnosis, and plan of care documented in the resident's note.  

## 2014-08-03 NOTE — Progress Notes (Signed)
Subjective:   Patient ID: Jordan [name], female    DOB: 1980-12-01, 34 y.o.   MRN: [id number]  HPI [name] is a 34 year old woman with history of basal cell carcinoma, vaginal candidiasis, Gardnerella, allergic rhinitis, GERD, fibromyalgia, anxiety presented for evaluation.  She reports she's been having some vaginal discharge since the beginning of last month. The discharge is clear white. It has been staying the same in quantity. She has had this before. She is having vaginal irritation and itching. Notes odor. She is not sexually active. Denies fevers chills, nausea vomiting, dysuria, hematuria.  Review of Systems Constitutional: no fevers/chills Eyes: no vision changes Ears, nose, mouth, throat, and face: no cough Respiratory: no shortness of breath Cardiovascular: no chest pain Gastrointestinal: no nausea/vomiting, no abdominal pain, no constipation, no diarrhea Genitourinary: no dysuria, no hematuria Integument: no rash Hematologic/lymphatic: no bleeding/bruising, no edema Musculoskeletal: no arthralgias, no myalgias Neurological: no paresthesias, no weakness  Past Medical History  Diagnosis Date  . Basal cell carcinoma     recurrent in left maxillary, has had 5 surguries, last one 2003  . Fibroma     left ovarian  . Cyst of nasal sinus   . Candidiasis, vagina     recurrent, pt has made lifestyle modifications and none recently (as of 8/12)  . Gardnerella infection     + in 08/08  . Whitlow     hx of herpetic requiring I+D,( was bitten by autistic child that cares fr  . Allergic rhinitis     pt takes flonase for episodes, no episodes in 2012  . PLANTAR FASCIITIS, BILATERAL 05/24/2009  . Paronychia of third finger of left hand 07/28/2010  . Fibromyalgia   . Herniated disc   . Anxiety   . GERD (gastroesophageal reflux disease)     occ  . OVARIAN CYSTECTOMY, HX OF 03/01/2006    ovarian fibroma-left 1996, L ovary and fallopian tube removed      Current  Outpatient Prescriptions on File Prior to Visit  Medication Sig Dispense Refill  . acetaminophen (TYLENOL ARTHRITIS PAIN) 650 MG CR tablet Take 650 mg by mouth every 8 (eight) hours as needed for pain.    . cholecalciferol (VITAMIN D) 1000 UNITS tablet Take 1,000 Units by mouth daily.    . citalopram (CELEXA) 20 MG tablet TAKE ONE TABLET BY MOUTH ONCE DAILY 30 tablet 3  . Multiple Vitamins-Minerals (ADULT ONE DAILY GUMMIES PO) Take 2 tablets by mouth daily.    . pregabalin (LYRICA) 100 MG capsule Take 1 capsule (100 mg total) by mouth at bedtime. 30 capsule 0  . PRESCRIPTION MEDICATION Place 1 drop into both eyes 2 (two) times daily as needed. Dry eyes    . traMADol (ULTRAM) 50 MG tablet Take 1 tablet (50 mg total) by mouth every 12 (twelve) hours as needed for severe pain. 8 tablet 0   No current facility-administered medications on file prior to visit.    Today's Vitals   08/03/14 0830  BP: 94/66  Pulse: 80  Temp: 98.1 F (36.7 C)  TempSrc: Oral  Height: 5\' 10"  (1.778 m)  Weight: 246 lb 12.8 oz (111.948 kg)  SpO2: 100%    Objective:  Physical Exam  Constitutional: She is oriented to person, place, and time. She appears well-developed and well-nourished.  HENT:  Head: Normocephalic and atraumatic.  Eyes: EOM are normal.  Neck: Neck supple.  Cardiovascular: Normal rate and regular rhythm.   Pulmonary/Chest: Effort normal.  Abdominal: Soft. There  is no tenderness.  Genitourinary: Vaginal discharge (white, thick) found.  Musculoskeletal: Normal range of motion.  Neurological: She is alert and oriented to person, place, and time.  Skin: Skin is warm and dry.  Psychiatric: She has a normal mood and affect.     Assessment & Plan:  Please refer to problem based charting.

## 2014-08-03 NOTE — Patient Instructions (Signed)
We will call you with results  

## 2014-08-03 NOTE — Assessment & Plan Note (Addendum)
Refilled Lyrica 100 mg daily at bedtime #30. Follow-up in one month with PCP

## 2014-08-03 NOTE — Assessment & Plan Note (Addendum)
White, thick discharge on vaginal exam.  Plan: -Wet prep, GC/chlamydia  Addendum: Negative for Candida, Gardnerella, Trichomonas, GC/chlamydia. As she did have thick white curd-like discharge on vaginal exam with vaginitis, we will treat empirically for Candida. Fluconazole 150 mg once. Prescription sent to pharmacy. Unable to contact patient by phone. Message sent by mychart.

## 2014-08-04 ENCOUNTER — Telehealth: Payer: Self-pay | Admitting: *Deleted

## 2014-08-04 ENCOUNTER — Encounter: Payer: Self-pay | Admitting: Pulmonary Disease

## 2014-08-04 LAB — CERVICOVAGINAL ANCILLARY ONLY
Chlamydia: NEGATIVE
Neisseria Gonorrhea: NEGATIVE
Wet Prep (BD Affirm): NEGATIVE

## 2014-08-04 MED ORDER — FLUCONAZOLE 150 MG PO TABS: 150.0000 mg | ORAL_TABLET | Freq: Once | ORAL | Status: DC

## 2014-08-04 NOTE — Addendum Note (Signed)
Addended by: Jacques Earthly T on: 08/04/2014 01:36 PM   Modules accepted: Orders

## 2014-08-04 NOTE — Telephone Encounter (Signed)
Thanks

## 2014-08-04 NOTE — Telephone Encounter (Signed)
Pt calls and states she has not gotten a call with her test results, she was called and informed of the email and the email was read to her, she was ask if she understood, had questions and was informed about the fluconazole and how to take, pharmacy was verified, call ended

## 2014-08-16 ENCOUNTER — Ambulatory Visit (INDEPENDENT_AMBULATORY_CARE_PROVIDER_SITE_OTHER): Payer: Medicare Other | Admitting: Internal Medicine

## 2014-08-16 ENCOUNTER — Encounter: Payer: Self-pay | Admitting: Internal Medicine

## 2014-08-16 VITALS — BP 102/53 | HR 64 | Temp 98.2°F | Ht 70.0 in | Wt 247.7 lb

## 2014-08-16 DIAGNOSIS — M797 Fibromyalgia: Secondary | ICD-10-CM | POA: Diagnosis not present

## 2014-08-16 DIAGNOSIS — R5382 Chronic fatigue, unspecified: Secondary | ICD-10-CM | POA: Diagnosis not present

## 2014-08-16 DIAGNOSIS — N898 Other specified noninflammatory disorders of vagina: Secondary | ICD-10-CM

## 2014-08-16 DIAGNOSIS — F329 Major depressive disorder, single episode, unspecified: Secondary | ICD-10-CM

## 2014-08-16 DIAGNOSIS — K120 Recurrent oral aphthae: Secondary | ICD-10-CM | POA: Diagnosis not present

## 2014-08-16 DIAGNOSIS — M5126 Other intervertebral disc displacement, lumbar region: Secondary | ICD-10-CM | POA: Diagnosis not present

## 2014-08-16 MED ORDER — CITALOPRAM HYDROBROMIDE 10 MG PO TABS: 10.0000 mg | ORAL_TABLET | Freq: Every day | ORAL | Status: DC

## 2014-08-16 NOTE — Assessment & Plan Note (Signed)
Pt appears to have a tiny, almost non-visible, flesh colored papule on the inside of her left cheek that has been there for about 3 days and is painful.  No fever/chills, lymphadenopathy, or other URI s/s.  Appears to be a canker sore and advised her that it will likely go away in a couple of weeks. -cont to monitor

## 2014-08-16 NOTE — Assessment & Plan Note (Signed)
Pt received an epidural inj from [facility] on 6/3 for lower back pain and feels better. -cont to monitor

## 2014-08-16 NOTE — Assessment & Plan Note (Addendum)
Pt doing well on lyrica 100mg  at bedtime and celexa 20mg .  She is getting into a new program for exercise and weight loss.  She was dismissed from PT because she "hurt her back."  Would like to cut down on celexa further.   -decrease celexa to 10mg  daily -f/u in 1 month -cont exercising with Alison Murray (currently 1.5 miles/day) -she is enrolling in a program called "[employer]" A total transformation that is supposed to be a health and wellness co that offers physical, mental, spiritual, and emotional support through personal training, group exercise programs, wellness coaching, and other necessary venues  (I cautioned her that I didn't want her to use any supplements or any other meds without consulting me first-->she said that she was only doing the personal training portion)

## 2014-08-16 NOTE — Assessment & Plan Note (Signed)
Resolved after diflucan x 1.

## 2014-08-16 NOTE — Progress Notes (Signed)
Patient ID: Jordan [name], female   DOB: [birth year], 34 y.o.   MRN: [id number]     Subjective:   Patient ID: Jordan [name] female    DOB: [birth year] 34 y.o.    MRN: [id number] Health Maintenance Due: Health Maintenance Due  Topic Date Due  . TETANUS/TDAP  09/13/1998    _________________________________________________  HPI: [name] is a 34 y.o. female here for a routine visit.  Pt has a PMH outlined below.  Please see problem-based charting assessment and plan note for further details of medical issues addressed at today's visit.  PMH: Past Medical History  Diagnosis Date  . Basal cell carcinoma     recurrent in left maxillary, has had 5 surguries, last one 2003  . Fibroma     left ovarian  . Cyst of nasal sinus   . Candidiasis, vagina     recurrent, pt has made lifestyle modifications and none recently (as of 8/12)  . Gardnerella infection     + in 08/08  . [place]low     hx of herpetic requiring I+D,( was bitten by autistic child that cares fr  . Allergic rhinitis     pt takes flonase for episodes, no episodes in 2012  . PLANTAR FASCIITIS, BILATERAL 05/24/2009  . Paronychia of third finger of left hand 07/28/2010  . Fibromyalgia   . Herniated disc   . Anxiety   . GERD (gastroesophageal reflux disease)     occ  . OVARIAN CYSTECTOMY, HX OF 03/01/2006    ovarian fibroma-left 1996, L ovary and fallopian tube removed      Medications: Current Outpatient Prescriptions on File Prior to Visit  Medication Sig Dispense Refill  . acetaminophen (TYLENOL ARTHRITIS PAIN) 650 MG CR tablet Take 650 mg by mouth every 8 (eight) hours as needed for pain.    . cholecalciferol (VITAMIN D) 1000 UNITS tablet Take 1,000 Units by mouth daily.    . citalopram (CELEXA) 20 MG tablet TAKE ONE TABLET BY MOUTH ONCE DAILY 30 tablet 3  . fluconazole (DIFLUCAN) 150 MG tablet Take 1 tablet (150 mg total) by mouth once. 1 tablet 0  . Multiple Vitamins-Minerals (ADULT ONE DAILY GUMMIES PO)  Take 2 tablets by mouth daily.    . pregabalin (LYRICA) 100 MG capsule Take 1 capsule (100 mg total) by mouth at bedtime. 30 capsule 0  . PRESCRIPTION MEDICATION Place 1 drop into both eyes 2 (two) times daily as needed. Dry eyes    . traMADol (ULTRAM) 50 MG tablet Take 1 tablet (50 mg total) by mouth every 12 (twelve) hours as needed for severe pain. 8 tablet 0   No current facility-administered medications on file prior to visit.    Allergies: No Known Allergies  FH: Family History  Problem Relation Age of Onset  . Stomach cancer      uncle  . Breast cancer      two aunts  . Cancer      unknown grandfather  . Diabetes      grandmother, aunt and 2 uncles  . Diabetes Maternal Grandmother     SH: History   Social History  . Marital Status: Single    Spouse Name: N/A  . Number of Children: N/A  . Years of Education: N/A   Occupational History  . unemployed   . disability    Social History Main Topics  . Smoking status: Never Smoker   . Smokeless tobacco: Not on file  . Alcohol Use: No  .  Drug Use: No  . Sexual Activity: Not Currently    Birth Control/ Protection: None   Other Topics Concern  . None   Social History Narrative   Lives in [place] in house with partner    Review of Systems: Constitutional: Negative for fever, chills and weight loss.  Eyes: Negative for blurred vision.  Respiratory: Negative for cough and shortness of breath.  Cardiovascular: Negative for chest pain, palpitations and leg swelling.  Gastrointestinal: Negative for nausea, vomiting, abdominal pain, diarrhea, constipation and blood in stool.  Genitourinary: Negative for dysuria, urgency and frequency.  Musculoskeletal: Negative for myalgias and back pain.  Neurological: Negative for dizziness, weakness and headaches.     Objective:   Vital Signs: Filed Vitals:   08/16/14 1321  BP: 102/53  Pulse: 64  Temp: 98.2 F (36.8 C)  TempSrc: Oral  Height: 5\' 10"  (1.778 m)    Weight: 247 lb 11.2 oz (112.356 kg)  SpO2: 100%     BP Readings from Last 3 Encounters:  08/16/14 102/53  08/03/14 94/66  05/03/14 95/59    Physical Exam: Constitutional: Vital signs reviewed.  Patient is in NAD and cooperative with exam.  Head: Normocephalic and atraumatic. Eyes: EOMI, conjunctivae nl, no scleral icterus.  Throat: Moist mucous membranes, no posterior erythema.  Tiny canker sore on the left inside cheek.   Neck: Supple, no lymphadenopathy.   Cardiovascular: RRR, no MRG. Pulmonary/Chest: normal effort, CTAB, no wheezes, rales, or rhonchi. Abdominal: Soft. NT/ND +BS. Neurological: A&O x3, cranial nerves II-XII are grossly intact, moving all extremities. Extremities: 2+DP b/l; no pitting edema. Skin: Warm, dry and intact. No rash.   Assessment & Plan:   Assessment and plan was discussed and formulated with my attending.

## 2014-08-16 NOTE — Patient Instructions (Signed)
Thank you for your visit today.   Please return to the internal medicine clinic in about 1 month(s) or sooner if needed.     Your current medical regimen is effective;  continue present plan and take all medications as prescribed.    I have made the following additions/changes to your medications: decrease celexa to 10mg  daily-->you may cut the 20mg  pill in half.    Please be sure to bring all of your medications with you to every visit; this includes herbal supplements, vitamins, eye drops, and any over-the-counter medications.   Should you have any questions regarding your medications and/or any new or worsening symptoms, please be sure to call the clinic at [phone].   If you believe that you are suffering from a life threatening condition or one that may result in the loss of limb or function, then you should call 911 or proceed to the nearest Emergency Department.     A healthy lifestyle and preventative care can promote health and wellness.   Maintain regular health, dental, and eye exams.  Eat a healthy diet. Foods like vegetables, fruits, whole grains, low-fat dairy products, and lean protein foods contain the nutrients you need without too many calories. Decrease your intake of foods high in solid fats, added sugars, and salt. Get information about a proper diet from your caregiver, if necessary.  Regular physical exercise is one of the most important things you can do for your health. Most adults should get at least 150 minutes of moderate-intensity exercise (any activity that increases your heart rate and causes you to sweat) each week. In addition, most adults need muscle-strengthening exercises on 2 or more days a week.   Maintain a healthy weight. The body mass index (BMI) is a screening tool to identify possible weight problems. It provides an estimate of body fat based on height and weight. Your caregiver can help determine your BMI, and can help you achieve or  maintain a healthy weight. For adults 20 years and older:  A BMI below 18.5 is considered underweight.  A BMI of 18.5 to 24.9 is normal.  A BMI of 25 to 29.9 is considered overweight.  A BMI of 30 and above is considered obese. Canker Sores  Canker sores are painful, open sores on the inside of the mouth and cheek. They may be white or yellow. The sores usually heal in 1 to 2 weeks. Women are more likely than men to have recurrent canker sores. CAUSES The cause of canker sores is not well understood. More than one cause is likely. Canker sores do not appear to be caused by certain types of germs (viruses or bacteria). Canker sores may be caused by:  An allergic reaction to certain foods.  Digestive problems.  Not having enough vitamin U98, folic acid, and iron.  Female sex hormones. Sores may come only during certain phases of a menstrual cycle. Often, there is improvement during pregnancy.  Genetics. Some people seem to inherit canker sore problems. Emotional stress and injuries to the mouth may trigger outbreaks, but not cause them.  DIAGNOSIS Canker sores are diagnosed by exam.  TREATMENT  Patients who have frequent bouts of canker sores may have cultures taken of the sores, blood tests, or allergy tests. This helps determine if their sores are caused by a poor diet, an allergy, or some other preventable or treatable disease.  Vitamins may prevent recurrences or reduce the severity of canker sores in people with poor nutrition.  Numbing ointments  can relieve pain. These are available in drug stores without a prescription.  Anti-inflammatory steroid mouth rinses or gels may be prescribed by your caregiver for severe sores.  Oral steroids may be prescribed if you have severe, recurrent canker sores. These strong medicines can cause many side effects and should be used only under the close direction of a dentist or physician.  Mouth rinses containing the antibiotic medicine may  be prescribed. They may lessen symptoms and speed healing. Healing usually happens in about 1 or 2 weeks with or without treatment. Certain antibiotic mouth rinses given to pregnant women and young children can permanently stain teeth. Talk to your caregiver about your treatment. HOME CARE INSTRUCTIONS   Avoid foods that cause canker sores for you.  Avoid citrus juices, spicy or salty foods, and coffee until the sores are healed.  Use a soft-bristled toothbrush.  Chew your food carefully to avoid biting your cheek.  Apply topical numbing medicine to the sore to help relieve pain.  Apply a thin paste of baking soda and water to the sore to help heal the sore.  Only use mouth rinses or medicines for pain or discomfort as directed by your caregiver. SEEK MEDICAL CARE IF:   Your symptoms are not better in 1 week.  Your sores are still present after 2 weeks.  Your sores are very painful.  You have trouble breathing or swallowing.  Your sores come back frequently. Document Released: 06/02/2010 Document Revised: 06/02/2012 Document Reviewed: 06/02/2010 [facility] Patient Information 2015 [place], [place]. This information is not intended to replace advice given to you by your health care provider. Make sure you discuss any questions you have with your health care provider.

## 2014-08-16 NOTE — Assessment & Plan Note (Signed)
-  decrease celexa to 10mg  daily -cont lyrica 100mg  daily

## 2014-08-17 NOTE — Progress Notes (Signed)
Internal Medicine Clinic Attending  Case discussed with Dr. Gill soon after the resident saw the patient.  We reviewed the resident's history and exam and pertinent patient test results.  I agree with the assessment, diagnosis, and plan of care documented in the resident's note.  

## 2014-09-27 ENCOUNTER — Encounter: Payer: Self-pay | Admitting: Internal Medicine

## 2014-09-27 ENCOUNTER — Ambulatory Visit (INDEPENDENT_AMBULATORY_CARE_PROVIDER_SITE_OTHER): Payer: Medicare Other | Admitting: Internal Medicine

## 2014-09-27 VITALS — BP 93/57 | HR 61 | Temp 98.2°F | Ht 70.0 in | Wt 244.7 lb

## 2014-09-27 DIAGNOSIS — M797 Fibromyalgia: Secondary | ICD-10-CM

## 2014-09-27 DIAGNOSIS — Z9889 Other specified postprocedural states: Secondary | ICD-10-CM

## 2014-09-27 DIAGNOSIS — Z85828 Personal history of other malignant neoplasm of skin: Secondary | ICD-10-CM | POA: Diagnosis not present

## 2014-09-27 DIAGNOSIS — Z6835 Body mass index (BMI) 35.0-35.9, adult: Secondary | ICD-10-CM | POA: Diagnosis not present

## 2014-09-27 DIAGNOSIS — E669 Obesity, unspecified: Secondary | ICD-10-CM | POA: Diagnosis not present

## 2014-09-27 DIAGNOSIS — Z0000 Encounter for general adult medical examination without abnormal findings: Secondary | ICD-10-CM

## 2014-09-27 MED ORDER — CITALOPRAM HYDROBROMIDE 10 MG PO TABS: 5.0000 mg | ORAL_TABLET | Freq: Every day | ORAL | Status: DC

## 2014-09-27 MED ORDER — PREGABALIN 75 MG PO CAPS: 75.0000 mg | ORAL_CAPSULE | Freq: Every day | ORAL | Status: DC

## 2014-09-27 NOTE — Progress Notes (Signed)
Patient ID: Jordan [name], female   DOB: March 22, 1980, 34 y.o.   MRN: [id number]     Subjective:   Patient ID: Jordan [name] female    DOB: [birth year] 34 y.o.    MRN: [id number] Health Maintenance Due: Health Maintenance Due  Topic Date Due  . TETANUS/TDAP  09/13/1998  . INFLUENZA VACCINE  09/20/2014    _________________________________________________  HPI: [name] is a 34 y.o. female here for a routine visit.  Pt has a PMH outlined below.  Please see problem-based charting assessment and plan for further details of medical issues addressed at today's visit.  PMH: Past Medical History  Diagnosis Date  . Basal cell carcinoma     recurrent in left maxillary, has had 5 surguries, last one 2003  . Fibroma     left ovarian  . Cyst of nasal sinus   . Candidiasis, vagina     recurrent, pt has made lifestyle modifications and none recently (as of 8/12)  . Gardnerella infection     + in 08/08  . Whitlow     hx of herpetic requiring I+D,( was bitten by autistic child that cares fr  . Allergic rhinitis     pt takes flonase for episodes, no episodes in 2012  . PLANTAR FASCIITIS, BILATERAL 05/24/2009  . Paronychia of third finger of left hand 07/28/2010  . Fibromyalgia   . Herniated disc   . Anxiety   . GERD (gastroesophageal reflux disease)     occ  . OVARIAN CYSTECTOMY, HX OF 03/01/2006    ovarian fibroma-left 1996, L ovary and fallopian tube removed      Medications: Current Outpatient Prescriptions on File Prior to Visit  Medication Sig Dispense Refill  . cholecalciferol (VITAMIN D) 1000 UNITS tablet Take 1,000 Units by mouth daily.    . Multiple Vitamins-Minerals (ADULT ONE DAILY GUMMIES PO) Take 2 tablets by mouth daily.    Marland Kitchen PRESCRIPTION MEDICATION Place 1 drop into both eyes 2 (two) times daily as needed. Dry eyes    . traMADol (ULTRAM) 50 MG tablet Take 1 tablet (50 mg total) by mouth every 12 (twelve) hours as needed for severe pain. 8 tablet 0   No current  facility-administered medications on file prior to visit.    Allergies: No Known Allergies  FH: Family History  Problem Relation Age of Onset  . Stomach cancer      uncle  . Breast cancer      two aunts  . Cancer      unknown grandfather  . Diabetes      grandmother, aunt and 2 uncles  . Diabetes Maternal Grandmother     SH: History   Social History  . Marital Status: Single    Spouse Name: N/A  . Number of Children: N/A  . Years of Education: N/A   Occupational History  . unemployed   . disability    Social History Main Topics  . Smoking status: Never Smoker   . Smokeless tobacco: Not on file  . Alcohol Use: No  . Drug Use: No  . Sexual Activity: Not Currently    Birth Control/ Protection: None   Other Topics Concern  . None   Social History Narrative   Lives in [place] in house with partner    Review of Systems: Constitutional: Negative for fever, chills and weight loss.  Eyes: Negative for blurred vision.  Respiratory: Negative for cough and shortness of breath.  Cardiovascular: Negative for chest pain, palpitations  and leg swelling.  Gastrointestinal: Negative for nausea, vomiting, abdominal pain, diarrhea, constipation and blood in stool.  Genitourinary: Negative for dysuria, urgency and frequency.  Musculoskeletal: Negative for myalgias and back pain.  Neurological: Negative for dizziness, weakness and headaches.     Objective:   Vital Signs: Filed Vitals:   09/27/14 1341  BP: 93/57  Pulse: 61  Temp: 98.2 F (36.8 C)  TempSrc: Oral  Height: 5\' 10"  (1.778 m)  Weight: 244 lb 11.2 oz (110.995 kg)  SpO2: 100%     BP Readings from Last 3 Encounters:  09/27/14 93/57  08/16/14 102/53  08/03/14 94/66    Physical Exam: Constitutional: Vital signs reviewed.  Patient is in NAD and cooperative with exam.  Head: Normocephalic and atraumatic. Eyes: EOMI, conjunctivae nl, no scleral icterus.  Neck: Supple. Cardiovascular: RRR, no  MRG. Pulmonary/Chest: normal effort, CTAB, no wheezes, rales, or rhonchi. Abdominal: Soft. NT/ND +BS. Neurological: A&O x3, cranial nerves II-XII are grossly intact, moving all extremities. Extremities: 2+DP b/l; no pitting edema. Skin: Warm, dry and intact. No rash.   Assessment & Plan:   Assessment and plan was discussed and formulated with my attending.

## 2014-09-27 NOTE — Assessment & Plan Note (Signed)
UTD except for tetanus.

## 2014-09-27 NOTE — Assessment & Plan Note (Addendum)
Doing well with the [employer] program.  Goes 3 x/wk for 1 hour doing cardio, etc.  Is walking 1.5 miles with [place] daily.  -cont the good work -f/u in 1 month -decrease celexa to 5mg  daily x 7 days and then 5mg  every other day and then stop (she is requesting to be off celexa) -wean down lyrica to 75mg  daily (would also like to get off this)

## 2014-09-27 NOTE — Patient Instructions (Signed)
Thank you for your visit today.  I am so proud of you--you're doing great!  Keep up the good work.   Please return to the internal medicine clinic in 1 month(s) or sooner if needed.     I have made the following additions/changes to your medications:   You may taper down on celexa as follows:   Take 5mg  once daily for 7 days, then take 5 mg every other day x 7 days and then stop.  Also, I have sent in a prescription for 75mg  lyrica to be taken daily.  You should discontinue the 100mg  capsule.    Please be sure to bring all of your medications with you to every visit; this includes herbal supplements, vitamins, eye drops, and any over-the-counter medications.   Should you have any questions regarding your medications and/or any new or worsening symptoms, please be sure to call the clinic at [phone].   If you believe that you are suffering from a life threatening condition or one that may result in the loss of limb or function, then you should call 911 and proceed to the nearest Emergency Department.   A healthy lifestyle and preventative care can promote health and wellness.   Maintain regular health, dental, and eye exams.  Eat a healthy diet. Foods like vegetables, fruits, whole grains, low-fat dairy products, and lean protein foods contain the nutrients you need without too many calories. Decrease your intake of foods high in solid fats, added sugars, and salt. Get information about a proper diet from your caregiver, if necessary.  Regular physical exercise is one of the most important things you can do for your health. Most adults should get at least 150 minutes of moderate-intensity exercise (any activity that increases your heart rate and causes you to sweat) each week. In addition, most adults need muscle-strengthening exercises on 2 or more days a week.   Maintain a healthy weight. The body mass index (BMI) is a screening tool to identify possible weight problems. It provides  an estimate of body fat based on height and weight. Your caregiver can help determine your BMI, and can help you achieve or maintain a healthy weight. For adults 20 years and older:  A BMI below 18.5 is considered underweight.  A BMI of 18.5 to 24.9 is normal.  A BMI of 25 to 29.9 is considered overweight.  A BMI of 30 and above is considered obese.

## 2014-09-27 NOTE — Assessment & Plan Note (Signed)
-  reminded to f/u with [facility]

## 2014-09-27 NOTE — Assessment & Plan Note (Signed)
-  cont to exercise

## 2014-10-06 NOTE — Progress Notes (Addendum)
Internal Medicine Clinic Attending  Case discussed with Dr. Gill soon after the resident saw the patient.  We reviewed the resident's history and exam and pertinent patient test results.  I agree with the assessment, diagnosis, and plan of care documented in the resident's note.  

## 2014-10-19 ENCOUNTER — Other Ambulatory Visit: Payer: Self-pay | Admitting: Internal Medicine

## 2014-10-28 DIAGNOSIS — D164 Benign neoplasm of bones of skull and face: Secondary | ICD-10-CM | POA: Diagnosis not present

## 2014-10-28 DIAGNOSIS — D165 Benign neoplasm of lower jaw bone: Secondary | ICD-10-CM | POA: Diagnosis not present

## 2014-10-29 ENCOUNTER — Other Ambulatory Visit: Payer: Self-pay | Admitting: Internal Medicine

## 2014-10-29 ENCOUNTER — Telehealth: Payer: Self-pay | Admitting: Internal Medicine

## 2014-10-29 NOTE — Telephone Encounter (Signed)
Call to patient to confirm appointment for 11/01/14 at 1:45 lmtcb

## 2014-10-30 ENCOUNTER — Other Ambulatory Visit: Payer: Self-pay | Admitting: Internal Medicine

## 2014-11-01 ENCOUNTER — Ambulatory Visit (INDEPENDENT_AMBULATORY_CARE_PROVIDER_SITE_OTHER): Payer: Medicare Other | Admitting: Internal Medicine

## 2014-11-01 ENCOUNTER — Encounter: Payer: Self-pay | Admitting: Internal Medicine

## 2014-11-01 VITALS — BP 89/60 | HR 68 | Temp 98.0°F | Ht 69.0 in | Wt 238.8 lb

## 2014-11-01 DIAGNOSIS — Z0000 Encounter for general adult medical examination without abnormal findings: Secondary | ICD-10-CM | POA: Diagnosis not present

## 2014-11-01 DIAGNOSIS — M797 Fibromyalgia: Secondary | ICD-10-CM

## 2014-11-01 DIAGNOSIS — Z23 Encounter for immunization: Secondary | ICD-10-CM | POA: Diagnosis not present

## 2014-11-01 NOTE — Progress Notes (Signed)
Internal Medicine Clinic Attending  Case discussed with Dr. Gill soon after the resident saw the patient.  We reviewed the resident's history and exam and pertinent patient test results.  I agree with the assessment, diagnosis, and plan of care documented in the resident's note.  

## 2014-11-01 NOTE — Progress Notes (Signed)
Patient ID: Jordan [name], female   DOB: 12/05/1980, 34 y.o.   MRN: [id number]     Subjective:   Patient ID: Jordan [name] female    DOB: 1980-08-31 34 y.o.    MRN: [id number] Health Maintenance Due: There are no preventive care reminders to display for this patient.  _________________________________________________  HPI: [name] ISOLA is a 34 y.o. female here for a routine visit.  Pt has a PMH outlined below.  Please see problem-based charting assessment and plan for further details of medical issues addressed at today's visit.  PMH: Past Medical History  Diagnosis Date  . Basal cell carcinoma     recurrent in left maxillary, has had 5 surguries, last one 2003  . Fibroma     left ovarian  . Cyst of nasal sinus   . Candidiasis, vagina     recurrent, pt has made lifestyle modifications and none recently (as of 8/12)  . Gardnerella infection     + in 08/08  . Whitlow     hx of herpetic requiring I+D,( was bitten by autistic child that cares fr  . Allergic rhinitis     pt takes flonase for episodes, no episodes in 2012  . PLANTAR FASCIITIS, BILATERAL 05/24/2009  . Paronychia of third finger of left hand 07/28/2010  . Fibromyalgia   . Herniated disc   . Anxiety   . GERD (gastroesophageal reflux disease)     occ  . OVARIAN CYSTECTOMY, HX OF 03/01/2006    ovarian fibroma-left 1996, L ovary and fallopian tube removed      Medications: Current Outpatient Prescriptions on File Prior to Visit  Medication Sig Dispense Refill  . cholecalciferol (VITAMIN D) 1000 UNITS tablet Take 1,000 Units by mouth daily.    . Multiple Vitamins-Minerals (ADULT ONE DAILY GUMMIES PO) Take 2 tablets by mouth daily.    . pregabalin (LYRICA) 50 MG capsule Take 1 capsule (50 mg total) by mouth at bedtime. 30 capsule 0  . PRESCRIPTION MEDICATION Place 1 drop into both eyes 2 (two) times daily as needed. Dry eyes    . traMADol (ULTRAM) 50 MG tablet Take 1 tablet (50 mg total) by mouth every 12  (twelve) hours as needed for severe pain. 8 tablet 0   No current facility-administered medications on file prior to visit.    Allergies: No Known Allergies  FH: Family History  Problem Relation Age of Onset  . Stomach cancer      uncle  . Breast cancer      two aunts  . Cancer      unknown grandfather  . Diabetes      grandmother, aunt and 2 uncles  . Diabetes Maternal Grandmother     SH: Social History   Social History  . Marital Status: Single    Spouse Name: N/A  . Number of Children: N/A  . Years of Education: N/A   Occupational History  . unemployed   . disability    Social History Main Topics  . Smoking status: Never Smoker   . Smokeless tobacco: None  . Alcohol Use: No  . Drug Use: No  . Sexual Activity: Not Currently    Birth Control/ Protection: None   Other Topics Concern  . None   Social History Narrative   Lives in [place] in house with partner    Review of Systems: Constitutional: Negative for fever, chills and weight loss.  Eyes: Negative for blurred vision.  Respiratory: Negative for cough and  shortness of breath.  Cardiovascular: Negative for chest pain, palpitations and leg swelling.  Gastrointestinal: Negative for nausea, vomiting, abdominal pain, diarrhea, constipation and blood in stool.  Genitourinary: Negative for dysuria, urgency and frequency.  Musculoskeletal: Negative for myalgias and back pain.  Neurological: Negative for dizziness, weakness and headaches.     Objective:   Vital Signs: Filed Vitals:   11/01/14 1331  BP: 89/60  Pulse: 68  Temp: 98 F (36.7 C)  TempSrc: Oral  Height: 5\' 9"  (1.753 m)  Weight: 238 lb 12.8 oz (108.319 kg)  SpO2: 100%      BP Readings from Last 3 Encounters:  11/01/14 89/60  09/27/14 93/57  08/16/14 102/53    Physical Exam: Constitutional: Vital signs reviewed.  Patient is in NAD and cooperative with exam.  Head: Normocephalic and atraumatic. Eyes: EOMI, conjunctivae nl, no  scleral icterus.  Neck: Supple. Cardiovascular: RRR, no MRG. Pulmonary/Chest: normal effort, CTAB, no wheezes, rales, or rhonchi. Abdominal: Soft. NT/ND +BS. Neurological: A&O x3, cranial nerves II-XII are grossly intact, moving all extremities. Extremities: 2+DP b/l; no pitting edema. Skin: Warm, dry and intact. No rash.   Assessment & Plan:   Assessment and plan was discussed and formulated with my attending.

## 2014-11-01 NOTE — Patient Instructions (Signed)
Thank you for your visit today.   Please return to the internal medicine clinic in 1 month(s) or sooner if needed.     I have made the following additions/changes to your medications: Decrease lyrica to 50mg  daily.   You need the following test(s) for regular health maintenance: You will get the flu shot and Tdap today.  You are doing so well with your weight!!  Keep up the good work!!  Please be sure to bring all of your medications with you to every visit; this includes herbal supplements, vitamins, eye drops, and any over-the-counter medications.   Should you have any questions regarding your medications and/or any new or worsening symptoms, please be sure to call the clinic at [phone].   If you believe that you are suffering from a life threatening condition or one that may result in the loss of limb or function, then you should call 911 and proceed to the nearest Emergency Department.

## 2014-11-07 NOTE — Assessment & Plan Note (Signed)
She is doing great with her fibromyalgia.  She has weaned down on lyrica to 50mg  daily and is completely off of celexa which should also help with weight loss.  She returns today with her daughter and just completed a 5K on Saturday.  She continues to walk with [facility] daily and participates in the [employer] program.  -cont lyrica 50mg  daily -f/u in 1 month

## 2014-11-07 NOTE — Assessment & Plan Note (Addendum)
-  influenza vaccine and Tdap given today

## 2014-11-29 ENCOUNTER — Ambulatory Visit (INDEPENDENT_AMBULATORY_CARE_PROVIDER_SITE_OTHER): Payer: Medicare Other | Admitting: Internal Medicine

## 2014-11-29 VITALS — BP 101/62 | HR 57 | Temp 98.3°F | Ht 70.0 in | Wt 236.4 lb

## 2014-11-29 DIAGNOSIS — E669 Obesity, unspecified: Secondary | ICD-10-CM

## 2014-11-29 DIAGNOSIS — Z9189 Other specified personal risk factors, not elsewhere classified: Secondary | ICD-10-CM

## 2014-11-29 DIAGNOSIS — Q8789 Other specified congenital malformation syndromes, not elsewhere classified: Secondary | ICD-10-CM

## 2014-11-29 DIAGNOSIS — M797 Fibromyalgia: Secondary | ICD-10-CM

## 2014-11-29 DIAGNOSIS — Z6833 Body mass index (BMI) 33.0-33.9, adult: Secondary | ICD-10-CM | POA: Diagnosis not present

## 2014-11-29 MED ORDER — PREGABALIN 25 MG PO CAPS: 25.0000 mg | ORAL_CAPSULE | Freq: Every day | ORAL | Status: DC

## 2014-11-29 NOTE — Assessment & Plan Note (Addendum)
Saw Dr. Sherwood Gambler, oral maxillofacial in [place] last month.  He did an XR and saw a small area in her jaw but did not feel it was large enough to biopsy so he told her to f/u in 6 months.  This is a hereditary condition that began causing her trouble at the age of 25.  It sounds like she had trouble with jaw cysts and had to have surgery back then.  She reports her brother and father also have had recent surgeries.   -will obtain records from Dr. Donnella Bi office

## 2014-11-29 NOTE — Assessment & Plan Note (Deleted)
-  will check HA1c today

## 2014-11-29 NOTE — Progress Notes (Signed)
Patient ID: MARJORY MEINTS, female   DOB: 1981/02/14, 34 y.o.   MRN: [id number]     Subjective:   Patient ID: GEORGEANN BRINKMAN female    DOB: Jan 16, 1981 34 y.o.    MRN: [id number] Health Maintenance Due: There are no preventive care reminders to display for this patient.  _________________________________________________  HPI: Ms.Sophiea KHRISTIN KELEHER is a 34 y.o. female here for a routine visit.  Pt has a PMH outlined below.  Please see problem-based charting assessment and plan for further status of patient's chronic medical problems addressed at today's visit.  PMH: Past Medical History  Diagnosis Date  . Basal cell carcinoma     recurrent in left maxillary, has had 5 surguries, last one 2003  . Fibroma     left ovarian  . Cyst of nasal sinus   . Candidiasis, vagina     recurrent, pt has made lifestyle modifications and none recently (as of 8/12)  . Gardnerella infection     + in 08/08  . Whitlow     hx of herpetic requiring I+D,( was bitten by autistic child that cares fr  . Allergic rhinitis     pt takes flonase for episodes, no episodes in 2012  . PLANTAR FASCIITIS, BILATERAL 05/24/2009  . Paronychia of third finger of left hand 07/28/2010  . Fibromyalgia   . Herniated disc   . Anxiety   . GERD (gastroesophageal reflux disease)     occ  . OVARIAN CYSTECTOMY, HX OF 03/01/2006    ovarian fibroma-left 1996, L ovary and fallopian tube removed      Medications: Current Outpatient Prescriptions on File Prior to Visit  Medication Sig Dispense Refill  . cholecalciferol (VITAMIN D) 1000 UNITS tablet Take 1,000 Units by mouth daily.    . Multiple Vitamins-Minerals (ADULT ONE DAILY GUMMIES PO) Take 2 tablets by mouth daily.    Marland Kitchen PRESCRIPTION MEDICATION Place 1 drop into both eyes 2 (two) times daily as needed. Dry eyes    . traMADol (ULTRAM) 50 MG tablet Take 1 tablet (50 mg total) by mouth every 12 (twelve) hours as needed for severe pain. 8 tablet 0   No current  facility-administered medications on file prior to visit.    Allergies: No Known Allergies  FH: Family History  Problem Relation Age of Onset  . Stomach cancer      uncle  . Breast cancer      two aunts  . Cancer      unknown grandfather  . Diabetes      grandmother, aunt and 2 uncles  . Diabetes Maternal Grandmother     SH: Social History   Social History  . Marital Status: Single    Spouse Name: N/A  . Number of Children: N/A  . Years of Education: N/A   Occupational History  . unemployed   . disability    Social History Main Topics  . Smoking status: Never Smoker   . Smokeless tobacco: Not on file  . Alcohol Use: No  . Drug Use: No  . Sexual Activity: Not Currently    Birth Control/ Protection: None   Other Topics Concern  . Not on file   Social History Narrative   Lives in [place] in house with partner    Review of Systems: Constitutional: Negative for fever, chills and weight loss.  Eyes: Negative for blurred vision.  Respiratory: Negative for cough and shortness of breath.  Cardiovascular: Negative for chest pain, palpitations and leg swelling.  Gastrointestinal: Negative for nausea, vomiting, abdominal pain, diarrhea, constipation and blood in stool.  Genitourinary: Negative for dysuria, urgency and frequency.  Musculoskeletal: Negative for myalgias and back pain.  Neurological: Negative for dizziness, weakness and headaches.     Objective:   Vital Signs: Filed Vitals:   11/29/14 1323  BP: 101/62  Pulse: 57  Temp: 98.3 F (36.8 C)  TempSrc: Oral  Height: 5\' 10"  (1.778 m)  Weight: 236 lb 6.4 oz (107.23 kg)  SpO2: 100%      BP Readings from Last 3 Encounters:  11/29/14 101/62  11/01/14 89/60  09/27/14 93/57    Physical Exam: Constitutional: Vital signs reviewed.  Patient is in NAD and cooperative with exam.  Head: Normocephalic and atraumatic. Eyes: EOMI, conjunctivae nl, no scleral icterus.  Neck: Supple. Cardiovascular:  RRR, no MRG. Pulmonary/Chest: normal effort, CTAB, no wheezes, rales, or rhonchi. Abdominal: Soft. Obese. NT/ND +BS. Neurological: A&O x3, cranial nerves II-XII are grossly intact, moving all extremities. Extremities: 2+DP b/l; no pitting edema. Skin: Warm, dry and intact. No rash.   Assessment & Plan:   Assessment and plan was discussed and formulated with my attending.

## 2014-11-29 NOTE — Patient Instructions (Signed)
Thank you for your visit today.   Please return to the internal medicine clinic in 1 month(s) or sooner if needed.     I have made the following additions/changes to your medications:  We will taper you off of lyrica.  Please take 25mg  daily at bedtime for 2 weeks.  Then you should take 25mg  every other day for 2 weeks and then stop.  Please call if you have any problems.   We will try to obtain records form your physician treating your basal cell.   You are doing great with weight loss and exercise!!  Keep up the good work!!   Please be sure to bring all of your medications with you to every visit; this includes herbal supplements, vitamins, eye drops, and any over-the-counter medications.   Should you have any questions regarding your medications and/or any new or worsening symptoms, please be sure to call the clinic at [phone].   If you believe that you are suffering from a life threatening condition or one that may result in the loss of limb or function, then you should call 911 and proceed to the nearest Emergency Department.   A healthy lifestyle and preventative care can promote health and wellness.   Maintain regular health, dental, and eye exams.  Eat a healthy diet. Foods like vegetables, fruits, whole grains, low-fat dairy products, and lean protein foods contain the nutrients you need without too many calories. Decrease your intake of foods high in solid fats, added sugars, and salt. Get information about a proper diet from your caregiver, if necessary.  Regular physical exercise is one of the most important things you can do for your health. Most adults should get at least 150 minutes of moderate-intensity exercise (any activity that increases your heart rate and causes you to sweat) each week. In addition, most adults need muscle-strengthening exercises on 2 or more days a week.   Maintain a healthy weight. The body mass index (BMI) is a screening tool to identify  possible weight problems. It provides an estimate of body fat based on height and weight. Your caregiver can help determine your BMI, and can help you achieve or maintain a healthy weight. For adults 20 years and older:  A BMI below 18.5 is considered underweight.  A BMI of 18.5 to 24.9 is normal.  A BMI of 25 to 29.9 is considered overweight.  A BMI of 30 and above is considered obese.

## 2014-11-29 NOTE — Assessment & Plan Note (Addendum)
Denies polyuria, polydipsia, polyphagia, nocturia.   -cont to monitor

## 2014-11-29 NOTE — Assessment & Plan Note (Addendum)
Jordan [name] continues to exercise and has lost 2 lbs since our last OV.  However, she reports eating a lot of junk food recently but will try to cut back.  -congratulated her on this accomplishment

## 2014-11-29 NOTE — Assessment & Plan Note (Addendum)
She is doing well on the current dose of 50mg  lyrica and would like to cont to taper off.  She is planning another 3K in January and cont to do the esteem program 3x/wk.  -called in prescription for lyrica 25mg  to take for 2 weeks and then taper to every other day for 2 weeks and then stop -instructed her to call if any problems  -will f/u in 1 month

## 2014-12-04 ENCOUNTER — Encounter: Payer: Self-pay | Admitting: Internal Medicine

## 2014-12-06 ENCOUNTER — Telehealth: Payer: Self-pay | Admitting: *Deleted

## 2014-12-06 NOTE — Telephone Encounter (Signed)
Contacted patient to make to let her know rx for Lyrica 25mg  called into pharmacy.  Pt aware, phone call complete.Jordan [name] Cassady10/17/201611:11 AM

## 2014-12-06 NOTE — Progress Notes (Signed)
Internal Medicine Clinic Attending  Case discussed with Dr. Gill soon after the resident saw the patient.  We reviewed the resident's history and exam and pertinent patient test results.  I agree with the assessment, diagnosis, and plan of care documented in the resident's note.  

## 2015-01-24 ENCOUNTER — Ambulatory Visit (INDEPENDENT_AMBULATORY_CARE_PROVIDER_SITE_OTHER): Payer: Medicare Other | Admitting: Internal Medicine

## 2015-01-24 ENCOUNTER — Encounter: Payer: Self-pay | Admitting: Internal Medicine

## 2015-01-24 VITALS — BP 93/54 | HR 72 | Temp 98.3°F | Wt 227.0 lb

## 2015-01-24 DIAGNOSIS — R5382 Chronic fatigue, unspecified: Secondary | ICD-10-CM

## 2015-01-24 DIAGNOSIS — M797 Fibromyalgia: Secondary | ICD-10-CM | POA: Diagnosis not present

## 2015-01-24 NOTE — Progress Notes (Signed)
Patient ID: Jordan [name], female   DOB: [birth year], 34 y.o.   MRN: [id number]     Subjective:   Patient ID: Jordan [name] female    DOB: 09/21/1980 34 y.o.    MRN: [id number] Health Maintenance Due: There are no preventive care reminders to display for this patient.  _________________________________________________  HPI: [name] is a 34 y.o. female here for a routine visit.  Pt has a PMH outlined below.  Please see problem-based charting assessment and plan for further status of patient's chronic medical problems addressed at today's visit.  PMH: Past Medical History  Diagnosis Date  . Basal cell carcinoma     recurrent in left maxillary, has had 5 surguries, last one 2003  . Fibroma     left ovarian  . Cyst of nasal sinus   . Candidiasis, vagina     recurrent, pt has made lifestyle modifications and none recently (as of 8/12)  . Gardnerella infection     + in 08/08  . [place]     hx of herpetic requiring I+D,( was bitten by autistic child that cares fr  . Allergic rhinitis     pt takes flonase for episodes, no episodes in 2012  . PLANTAR FASCIITIS, BILATERAL 05/24/2009  . Paronychia of third finger of left hand 07/28/2010  . Fibromyalgia   . Herniated disc   . Anxiety   . GERD (gastroesophageal reflux disease)     occ  . OVARIAN CYSTECTOMY, HX OF 03/01/2006    ovarian fibroma-left 1996, L ovary and fallopian tube removed      Medications: Current Outpatient Prescriptions on File Prior to Visit  Medication Sig Dispense Refill  . cholecalciferol (VITAMIN D) 1000 UNITS tablet Take 1,000 Units by mouth daily.    . Multiple Vitamins-Minerals (ADULT ONE DAILY GUMMIES PO) Take 2 tablets by mouth daily.    . pregabalin (LYRICA) 25 MG capsule Take 1 capsule (25 mg total) by mouth at bedtime. 30 capsule 0  . PRESCRIPTION MEDICATION Place 1 drop into both eyes 2 (two) times daily as needed. Dry eyes    . traMADol (ULTRAM) 50 MG tablet Take 1 tablet (50 mg total) by  mouth every 12 (twelve) hours as needed for severe pain. 8 tablet 0   No current facility-administered medications on file prior to visit.    Allergies: No Known Allergies  FH: Family History  Problem Relation Age of Onset  . Stomach cancer      uncle  . Breast cancer      two aunts  . Cancer      unknown grandfather  . Diabetes      grandmother, aunt and 2 uncles  . Diabetes Maternal Grandmother     SH: Social History   Social History  . Marital Status: Single    Spouse Name: N/A  . Number of Children: N/A  . Years of Education: N/A   Occupational History  . unemployed   . disability    Social History Main Topics  . Smoking status: Never Smoker   . Smokeless tobacco: None  . Alcohol Use: No  . Drug Use: No  . Sexual Activity: Not Currently    Birth Control/ Protection: None   Other Topics Concern  . None   Social History Narrative   Lives in [place] in house with partner    Review of Systems: Constitutional: Negative for fever, chills and +intentional weight loss.  Eyes: Negative for blurred vision.  Respiratory: Negative  for cough and shortness of breath.  Cardiovascular: Negative for chest pain, palpitations and leg swelling.  Gastrointestinal: Negative for nausea, vomiting, abdominal pain, diarrhea, constipation and blood in stool.  Genitourinary: Negative for dysuria, urgency and frequency.  Musculoskeletal: +myalgias and back pain.  Neurological: Negative for dizziness, weakness and headaches.     Objective:   Vital Signs: Filed Vitals:   01/24/15 1515  BP: 93/54  Pulse: 72  Temp: 98.3 F (36.8 C)  TempSrc: Oral  Weight: 227 lb (102.967 kg)  SpO2: 100%     BP Readings from Last 3 Encounters:  01/24/15 93/54  11/29/14 101/62  11/01/14 89/60    Physical Exam: Constitutional: Vital signs reviewed.  Patient is in NAD and cooperative with exam.  Head: Normocephalic and atraumatic. Eyes: EOMI, conjunctivae nl, no scleral icterus.    Neck: Supple. Cardiovascular: RRR, no MRG. Pulmonary/Chest: normal effort, CTAB, no wheezes, rales, or rhonchi. Abdominal: Soft. NT/ND +BS. Musculoskeletal: Spine FROM, without pain,swelling, or deformity.  Neurological: A&O x3, cranial nerves II-XII are grossly intact, moving all extremities. Extremities: 2+DP b/l; no pitting edema. Skin: Warm, dry and intact. No rash.   Assessment & Plan:   Assessment and plan was discussed and formulated with my attending.

## 2015-01-24 NOTE — Assessment & Plan Note (Addendum)
She reports increased energy since working out and losing weight.  She is doing well after coming off celexa.  -I congratulated her on this accomplishment

## 2015-01-24 NOTE — Assessment & Plan Note (Addendum)
Pt doing well without lyrica.  She did have a fall on Thanksgiving day after her legs were sore from working out and she fell down the stairs.  No red flags.  Has taken some tramadol that has helped.  She is also using ice/heat.  States the pain is not getting any worse but is about the same.  She does not want to go back on lyrica even though she has experienced some pain in her feet and her arms.   -cont conservative mgmt for now -f/u in 3-4 months

## 2015-01-24 NOTE — Patient Instructions (Signed)
Thank you for your visit today.   Please return to the internal medicine clinic in 3 month(s) or sooner if needed.  Please call if you need to be seen sooner.    Keep up the good work--you have done so well!!    Please be sure to bring all of your medications with you to every visit; this includes herbal supplements, vitamins, eye drops, and any over-the-counter medications.   Should you have any questions regarding your medications and/or any new or worsening symptoms, please be sure to call the clinic at [phone].   If you believe that you are suffering from a life threatening condition or one that may result in the loss of limb or function, then you should call 911 and proceed to the nearest Emergency Department.

## 2015-02-01 NOTE — Progress Notes (Signed)
Internal Medicine Clinic Attending  Case discussed with Dr. Gill soon after the resident saw the patient.  We reviewed the resident's history and exam and pertinent patient test results.  I agree with the assessment, diagnosis, and plan of care documented in the resident's note.  

## 2015-02-03 ENCOUNTER — Encounter: Payer: Self-pay | Admitting: Internal Medicine

## 2015-02-03 ENCOUNTER — Ambulatory Visit (INDEPENDENT_AMBULATORY_CARE_PROVIDER_SITE_OTHER): Payer: Medicare Other | Admitting: Internal Medicine

## 2015-02-03 VITALS — BP 97/57 | HR 60 | Temp 98.1°F | Ht 70.0 in | Wt 225.5 lb

## 2015-02-03 DIAGNOSIS — I950 Idiopathic hypotension: Secondary | ICD-10-CM | POA: Diagnosis not present

## 2015-02-03 DIAGNOSIS — J029 Acute pharyngitis, unspecified: Secondary | ICD-10-CM | POA: Diagnosis not present

## 2015-02-03 DIAGNOSIS — M791 Myalgia: Secondary | ICD-10-CM | POA: Diagnosis not present

## 2015-02-03 DIAGNOSIS — R5381 Other malaise: Secondary | ICD-10-CM

## 2015-02-03 DIAGNOSIS — J069 Acute upper respiratory infection, unspecified: Secondary | ICD-10-CM | POA: Insufficient documentation

## 2015-02-03 DIAGNOSIS — R05 Cough: Secondary | ICD-10-CM

## 2015-02-03 MED ORDER — DIPHENHYDRAMINE HCL 25 MG PO CAPS: 25.0000 mg | ORAL_CAPSULE | ORAL | Status: DC | PRN

## 2015-02-03 NOTE — Progress Notes (Signed)
Patient ID: Jordan [name], female   DOB: February 03, 1981, 34 y.o.   MRN: [id number] [facility] Subjective:   Patient ID: Jordan [name] female   DOB: 01/10/1981 34 y.o.   MRN: [id number]  HPI: [name] is a 34 y.o. female with a history noted below presenting to clinic with a 2 day history of pharyngitis, non-productive cough, myalgias, chills, and malaise. Her symptoms started with a pharyngitis that got progressively worse 2 days ago,  With subsequent cough, myalgias, and malaise. She felt some chills last night, but did not take her temperature.  She did get her flu shot this year, and multiple people at her work have been sick as well.  Regarding her hypotension, she has always had low blood pressures.  She tells me other people in her family have issues with her adrenal gland, and her mom has hypothyroidism.  She has been chronically tired for years, but denies any change in her skin colors  Past Medical History  Diagnosis Date  . Basal cell carcinoma     recurrent in left maxillary, has had 5 surguries, last one 2003  . Fibroma     left ovarian  . Cyst of nasal sinus   . Candidiasis, vagina     recurrent, pt has made lifestyle modifications and none recently (as of 8/12)  . Gardnerella infection     + in 08/08  . [place]     hx of herpetic requiring I+D,( was bitten by autistic child that cares fr  . Allergic rhinitis     pt takes flonase for episodes, no episodes in 2012  . PLANTAR FASCIITIS, BILATERAL 05/24/2009  . Paronychia of third finger of left hand 07/28/2010  . Fibromyalgia   . Herniated disc   . Anxiety   . GERD (gastroesophageal reflux disease)     occ  . OVARIAN CYSTECTOMY, HX OF 03/01/2006    ovarian fibroma-left 1996, L ovary and fallopian tube removed     Current Outpatient Prescriptions  Medication Sig Dispense Refill  . cholecalciferol (VITAMIN D) 1000 UNITS tablet Take 1,000 Units by mouth daily.    . Multiple Vitamins-Minerals  (ADULT ONE DAILY GUMMIES PO) Take 2 tablets by mouth daily.    Marland Kitchen PRESCRIPTION MEDICATION Place 1 drop into both eyes 2 (two) times daily as needed. Dry eyes    . traMADol (ULTRAM) 50 MG tablet Take 1 tablet (50 mg total) by mouth every 12 (twelve) hours as needed for severe pain. 8 tablet 0   No current facility-administered medications for this visit.   Family History  Problem Relation Age of Onset  . Stomach cancer      uncle  . Breast cancer      two aunts  . Cancer      unknown grandfather  . Diabetes      grandmother, aunt and 2 uncles  . Diabetes Maternal Grandmother    Social History   Social History  . Marital Status: Single    Spouse Name: N/A  . Number of Children: N/A  . Years of Education: N/A   Occupational History  . unemployed   . disability    Social History Main Topics  . Smoking status: Never Smoker   . Smokeless tobacco: None  . Alcohol Use: No  . Drug Use: No  . Sexual Activity: Not Currently    Birth Control/ Protection: None   Other Topics Concern  . None   Social History Narrative  Lives in [place] in house with partner   Review of Systems  Constitutional: Positive for chills and malaise/fatigue. Negative for fever and weight loss.  HENT: Positive for congestion and sore throat.   Eyes: Negative for blurred vision and double vision.  Respiratory: Positive for cough. Negative for sputum production, shortness of breath and wheezing.   Cardiovascular: Negative for chest pain and palpitations.  Gastrointestinal: Positive for diarrhea. Negative for nausea and vomiting.  Genitourinary: Negative for dysuria and urgency.  Musculoskeletal: Positive for myalgias.  Skin: Negative for itching and rash.  Neurological: Negative for dizziness, seizures, loss of consciousness and headaches.   Objective:  Physical Exam: Filed Vitals:   02/03/15 1033  BP: 97/57  Pulse: 60  Temp: 98.1 F (36.7 C)  TempSrc: Oral  Height: 5\' 10"  (1.778 m)   Weight: 225 lb 8 oz (102.286 kg)  SpO2: 100%   General: resting in chair comfortably, appropriately conversational HEENT: no scleral icterus, extra-ocular muscles intact, oropharynx without lesions Cardiac: regular rate and rhythm, no rubs, murmurs or gallops Pulm: breathing well, clear to auscultation bilaterally Abd: bowel sounds normal, soft, nondistended, non-tender Ext: warm and well perfused, without pedal edema Lymph: no cervical or supraclavicular lymphadenopathy Skin: no rash, hair, or nail changes Neuro: alert and oriented X3, cranial nerves II-XII grossly intact, moving all extremities well  Assessment & Plan:  Case discussed with Dr. Daryll Drown  Viral URI  Her 2 day history of pharyngitis , cough, myalgias, and malaise sounds most consistent with a viral upper respiratory infection. I doubt any superimposed bacterial infection at this time that might warrant antibiotics. I prescribed her Benadryl to help her sleep at night and clear up her secretions. She's been taking Alka-Seltzer cold as well as Tylenol.  Idiopathic hypotension She has a chronically low blood pressure, with chronic malaise and weakness and a family history of adrenal disease. She doesn't have any other signs of adrenal insufficiency, and her sodium and potassium levels were normal, but she was agreeable to check a morning cortisol, which I've ordered today to be collected before 9 AM.    Medications Ordered Meds ordered this encounter  Medications  . diphenhydrAMINE (BENADRYL) 25 mg capsule    Sig: Take 1 capsule (25 mg total) by mouth every 4 (four) hours as needed.    Dispense:  24 capsule    Refill:  2   Other Orders Orders Placed This Encounter  Procedures  . Cortisol    Please collect as early as possible, no later than 9am. Thank you!    Standing Status: Future     Number of Occurrences:      Standing Expiration Date: 02/03/2016   Follow Up: Return in about 6 months (around 08/04/2015), or  if symptoms worsen or fail to improve.

## 2015-02-03 NOTE — Assessment & Plan Note (Signed)
Her 2 day history of pharyngitis , cough, myalgias, and malaise sounds most consistent with a viral upper respiratory infection. I doubt any superimposed bacterial infection at this time that might warrant antibiotics. I prescribed her Benadryl to help her sleep at night and clear up her secretions. She's been taking Alka-Seltzer cold as well as Tylenol.

## 2015-02-03 NOTE — Assessment & Plan Note (Signed)
She has a chronically low blood pressure, with chronic malaise and weakness and a family history of adrenal disease. She doesn't have any other signs of adrenal insufficiency, and her sodium and potassium levels were normal, but she was agreeable to check a morning cortisol, which I've ordered today to be collected before 9 AM.

## 2015-02-08 NOTE — Progress Notes (Signed)
Internal Medicine Clinic Attending  I saw and evaluated the patient.  I personally confirmed the key portions of the history and exam documented by Dr. Flores and I reviewed pertinent patient test results.  The assessment, diagnosis, and plan were formulated together and I agree with the documentation in the resident's note.  

## 2015-03-03 ENCOUNTER — Other Ambulatory Visit: Payer: Self-pay | Admitting: Internal Medicine

## 2015-03-03 MED ORDER — TRAMADOL HCL 50 MG PO TABS: 50.0000 mg | ORAL_TABLET | Freq: Two times a day (BID) | ORAL | Status: DC | PRN

## 2015-03-03 NOTE — Telephone Encounter (Signed)
Patient is calling asking for refill for tramadol

## 2015-03-04 NOTE — Telephone Encounter (Signed)
Called to pharm 

## 2015-03-18 ENCOUNTER — Ambulatory Visit (INDEPENDENT_AMBULATORY_CARE_PROVIDER_SITE_OTHER): Payer: Medicare Other | Admitting: Internal Medicine

## 2015-03-18 ENCOUNTER — Encounter: Payer: Self-pay | Admitting: Internal Medicine

## 2015-03-18 VITALS — BP 102/57 | HR 60 | Temp 98.1°F | Ht 69.0 in | Wt 215.0 lb

## 2015-03-18 DIAGNOSIS — B9689 Other specified bacterial agents as the cause of diseases classified elsewhere: Secondary | ICD-10-CM | POA: Diagnosis not present

## 2015-03-18 DIAGNOSIS — L02222 Furuncle of back [any part, except buttock]: Secondary | ICD-10-CM

## 2015-03-18 DIAGNOSIS — L0292 Furuncle, unspecified: Secondary | ICD-10-CM

## 2015-03-18 NOTE — Patient Instructions (Signed)
1. Take Ibuprofen 400-600 mg every 8 hours as needed.  You can take tylenol 500 mg in between doses if needed.

## 2015-03-18 NOTE — Progress Notes (Signed)
Patient ID: Jordan [name], female   DOB: [birth year], 35 y.o.   MRN: [id number]   Subjective:   Patient ID: Jordan [name] female   DOB: 1980/12/01 35 y.o.   MRN: [id number]  HPI: [name] is a 35 y.o. female with PMH as below, here for evaluation of "knot" on her back.  Please see Problem-Based charting for the status of the patient's chronic medical issues.   Patient reports 1 week h/o "knot" on her back that burst after being ruptured by her bra hook. She describes the pain as sharp without radiation. It is relieved by not applying pressure or wearing a bra.  She has a h/o similar lesions in her vaginal area and anterior thighs.  Her last lesion was over 1 year ago.  She denies lesions in the axillae or under breasts.  She denies fever, chills, CP, SOB, or N/V.  Past Medical History  Diagnosis Date  . Basal cell carcinoma     recurrent in left maxillary, has had 5 surguries, last one 2003  . Fibroma     left ovarian  . Cyst of nasal sinus   . Candidiasis, vagina     recurrent, pt has made lifestyle modifications and none recently (as of 8/12)  . Gardnerella infection     + in 08/08  . [place]     hx of herpetic requiring I+D,( was bitten by autistic child that cares fr  . Allergic rhinitis     pt takes flonase for episodes, no episodes in 2012  . PLANTAR FASCIITIS, BILATERAL 05/24/2009  . Paronychia of third finger of left hand 07/28/2010  . Fibromyalgia   . Herniated disc   . Anxiety   . GERD (gastroesophageal reflux disease)     occ  . OVARIAN CYSTECTOMY, HX OF 03/01/2006    ovarian fibroma-left 1996, L ovary and fallopian tube removed     Current Outpatient Prescriptions  Medication Sig Dispense Refill  . cholecalciferol (VITAMIN D) 1000 UNITS tablet Take 1,000 Units by mouth daily.    . diphenhydrAMINE (BENADRYL) 25 mg capsule Take 1 capsule (25 mg total) by mouth every 4 (four) hours as needed. 24 capsule 2  . Multiple Vitamins-Minerals (ADULT ONE DAILY GUMMIES  PO) Take 2 tablets by mouth daily.    Marland Kitchen PRESCRIPTION MEDICATION Place 1 drop into both eyes 2 (two) times daily as needed. Dry eyes    . traMADol (ULTRAM) 50 MG tablet Take 1 tablet (50 mg total) by mouth every 12 (twelve) hours as needed for severe pain. 20 tablet 0   No current facility-administered medications for this visit.   Family History  Problem Relation Age of Onset  . Stomach cancer      uncle  . Breast cancer      two aunts  . Cancer      unknown grandfather  . Diabetes      grandmother, aunt and 2 uncles  . Diabetes Maternal Grandmother    Social History   Social History  . Marital Status: Single    Spouse Name: N/A  . Number of Children: N/A  . Years of Education: N/A   Occupational History  . unemployed   . disability    Social History Main Topics  . Smoking status: Never Smoker   . Smokeless tobacco: Not on file  . Alcohol Use: No  . Drug Use: No  . Sexual Activity: Not Currently    Birth Control/ Protection: None   Other Topics  Concern  . Not on file   Social History Narrative   Lives in [place] in house with partner   Review of Systems: Pertinent items are noted in HPI. Objective:  Physical Exam: There were no vitals filed for this visit. Physical Exam  Constitutional: She is oriented to person, place, and time and well-developed, well-nourished, and in no distress. No distress.  HENT:  Head: Normocephalic and atraumatic.  Eyes: EOM are normal. No scleral icterus.  Neurological: She is alert and oriented to person, place, and time.  Skin: Skin is warm. She is not diaphoretic.  Slightly hypopigmented ~0.5 cm papule over thoracic spine without fluctuance or subcutaneous component.  TTP without radiation.     Assessment & Plan:   Patient and case were discussed with Dr. Daryll Drown.  Please refer to Problem Based charting for further documentation.

## 2015-03-18 NOTE — Assessment & Plan Note (Signed)
A/P: Well healing furuncle after rupture by bra strap.  No subepidermal component that suggests need for injection or drainage.  - Ibuprofen and Tylenol PRN

## 2015-03-21 ENCOUNTER — Other Ambulatory Visit (INDEPENDENT_AMBULATORY_CARE_PROVIDER_SITE_OTHER): Payer: Medicare Other

## 2015-03-21 DIAGNOSIS — I950 Idiopathic hypotension: Secondary | ICD-10-CM | POA: Diagnosis not present

## 2015-03-22 LAB — CORTISOL: Cortisol: 20.7 ug/dL

## 2015-03-23 NOTE — Progress Notes (Signed)
Internal Medicine Clinic Attending  Case discussed with Dr. Taylor at the time of the visit.  We reviewed the resident's history and exam and pertinent patient test results.  I agree with the assessment, diagnosis, and plan of care documented in the resident's note. 

## 2015-04-04 ENCOUNTER — Encounter: Payer: Self-pay | Admitting: *Deleted

## 2015-04-04 ENCOUNTER — Ambulatory Visit (INDEPENDENT_AMBULATORY_CARE_PROVIDER_SITE_OTHER): Payer: Medicare Other | Admitting: Internal Medicine

## 2015-04-04 ENCOUNTER — Encounter: Payer: Self-pay | Admitting: Internal Medicine

## 2015-04-04 VITALS — BP 104/62 | HR 70 | Temp 98.0°F | Wt 211.2 lb

## 2015-04-04 DIAGNOSIS — R5382 Chronic fatigue, unspecified: Secondary | ICD-10-CM

## 2015-04-04 DIAGNOSIS — M5127 Other intervertebral disc displacement, lumbosacral region: Secondary | ICD-10-CM | POA: Diagnosis not present

## 2015-04-04 DIAGNOSIS — F418 Other specified anxiety disorders: Secondary | ICD-10-CM

## 2015-04-04 DIAGNOSIS — M5126 Other intervertebral disc displacement, lumbar region: Secondary | ICD-10-CM

## 2015-04-04 DIAGNOSIS — J309 Allergic rhinitis, unspecified: Secondary | ICD-10-CM | POA: Diagnosis not present

## 2015-04-04 DIAGNOSIS — M797 Fibromyalgia: Secondary | ICD-10-CM

## 2015-04-04 MED ORDER — ALPRAZOLAM 0.5 MG PO TABS: 0.5000 mg | ORAL_TABLET | Freq: Every day | ORAL | Status: DC | PRN

## 2015-04-04 MED ORDER — CYCLOBENZAPRINE HCL 5 MG PO TABS: 5.0000 mg | ORAL_TABLET | Freq: Every evening | ORAL | Status: DC | PRN

## 2015-04-04 MED ORDER — FLUTICASONE PROPIONATE 50 MCG/ACT NA SUSP: 2.0000 | Freq: Every day | NASAL | Status: DC | PRN

## 2015-04-04 NOTE — Assessment & Plan Note (Addendum)
She is requesting referral back to orthopedics.  P: -referral given to [employer]

## 2015-04-04 NOTE — Patient Instructions (Signed)
Thank you for your visit today.   Please return to the internal medicine clinic in 2-3 month(s) or sooner if needed.     I have made the following additions/changes to your medications:  You may take flexeril 5mg  at night as needed for muscle spasms. You may also take 0.5mg  xanax once daily as needed for anxiety attacks.  Please do not operate heavy equipment when using this medication.   I will also refer you our social worker for counseling options.   I have made the following referrals for you: Orthopedics.   Please be sure to bring all of your medications with you to every visit; this includes herbal supplements, vitamins, eye drops, and any over-the-counter medications.   Should you have any questions regarding your medications and/or any new or worsening symptoms, please be sure to call the clinic at [phone].   If you believe that you are suffering from a life threatening condition or one that may result in the loss of limb or function, then you should call 911 and proceed to the nearest Emergency Department.   A healthy lifestyle and preventative care can promote health and wellness.   Maintain regular health, dental, and eye exams.  Eat a healthy diet. Foods like vegetables, fruits, whole grains, low-fat dairy products, and lean protein foods contain the nutrients you need without too many calories. Decrease your intake of foods high in solid fats, added sugars, and salt. Get information about a proper diet from your caregiver, if necessary.  Regular physical exercise is one of the most important things you can do for your health. Most adults should get at least 150 minutes of moderate-intensity exercise (any activity that increases your heart rate and causes you to sweat) each week. In addition, most adults need muscle-strengthening exercises on 2 or more days a week.   Maintain a healthy weight. The body mass index (BMI) is a screening tool to identify possible weight  problems. It provides an estimate of body fat based on height and weight. Your caregiver can help determine your BMI, and can help you achieve or maintain a healthy weight. For adults 20 years and older:  A BMI below 18.5 is considered underweight.  A BMI of 18.5 to 24.9 is normal.  A BMI of 25 to 29.9 is considered overweight.  A BMI of 30 and above is considered obese.

## 2015-04-04 NOTE — Progress Notes (Addendum)
Patient ID: CHIYOKO FLEISHER, female   DOB: 1980/11/02, 35 y.o.   MRN: [id number]     Subjective:   Patient ID: MALLEY ESCOBAR female    DOB: [birth year] 35 y.o.    MRN: [id number] Health Maintenance Due: There are no preventive care reminders to display for this patient.  _________________________________________________  HPI: Ms.Sabrea AZAELIA KINCANNON is a 34 y.o. female here for a acute visit.  Pt has a PMH outlined below.  Please see problem-based charting assessment and plan for further status of patient's chronic medical problems addressed at today's visit.  PMH: Past Medical History  Diagnosis Date  . Basal cell carcinoma     recurrent in left maxillary, has had 5 surguries, last one 2003  . Fibroma     left ovarian  . Cyst of nasal sinus   . Candidiasis, vagina     recurrent, pt has made lifestyle modifications and none recently (as of 8/12)  . Gardnerella infection     + in 08/08  . [place]     hx of herpetic requiring I+D,( was bitten by autistic child that cares fr  . Allergic rhinitis     pt takes flonase for episodes, no episodes in 2012  . PLANTAR FASCIITIS, BILATERAL 05/24/2009  . Paronychia of third finger of left hand 07/28/2010  . Fibromyalgia   . Herniated disc   . Anxiety   . GERD (gastroesophageal reflux disease)     occ  . OVARIAN CYSTECTOMY, HX OF 03/01/2006    ovarian fibroma-left 1996, L ovary and fallopian tube removed      Medications: Current Outpatient Prescriptions on File Prior to Visit  Medication Sig Dispense Refill  . cholecalciferol (VITAMIN D) 1000 UNITS tablet Take 1,000 Units by mouth daily.    . diphenhydrAMINE (BENADRYL) 25 mg capsule Take 1 capsule (25 mg total) by mouth every 4 (four) hours as needed. 24 capsule 2  . Multiple Vitamins-Minerals (ADULT ONE DAILY GUMMIES PO) Take 2 tablets by mouth daily.    Marland Kitchen PRESCRIPTION MEDICATION Place 1 drop into both eyes 2 (two) times daily as needed. Dry eyes    . traMADol (ULTRAM) 50 MG tablet Take 1  tablet (50 mg total) by mouth every 12 (twelve) hours as needed for severe pain. 20 tablet 0   No current facility-administered medications on file prior to visit.    Allergies: No Known Allergies  FH: Family History  Problem Relation Age of Onset  . Stomach cancer      uncle  . Breast cancer      two aunts  . Cancer      unknown grandfather  . Diabetes      grandmother, aunt and 2 uncles  . Diabetes Maternal Grandmother     SH: Social History   Social History  . Marital Status: Single    Spouse Name: N/A  . Number of Children: N/A  . Years of Education: N/A   Occupational History  . unemployed   . disability    Social History Main Topics  . Smoking status: Never Smoker   . Smokeless tobacco: None  . Alcohol Use: No  . Drug Use: No  . Sexual Activity: Not Currently    Birth Control/ Protection: None   Other Topics Concern  . None   Social History Narrative   Lives in [place] in house with partner    Review of Systems: Constitutional: Negative for fever, chills and weight loss.  Eyes: Negative for blurred vision.  Respiratory: Negative for cough and shortness of breath.  Cardiovascular: Negative for chest pain, palpitations and leg swelling.  Gastrointestinal: Negative for nausea, vomiting, abdominal pain, diarrhea, constipation and blood in stool.  Genitourinary: Negative for dysuria, urgency and frequency.  Musculoskeletal: +myalgias and back pain.  Neurological: Negative for dizziness, weakness and headaches.  Psych: +Anxiety, panic attacks    Objective:   Vital Signs: Filed Vitals:   04/04/15 1535  BP: 104/62  Pulse: 70  Temp: 98 F (36.7 C)  TempSrc: Oral  Weight: 211 lb 3.2 oz (95.8 kg)  SpO2: 100%     BP Readings from Last 3 Encounters:  04/04/15 104/62  03/18/15 102/57  02/03/15 97/57    Physical Exam: Constitutional: Vital signs reviewed.  Patient is in NAD and cooperative with exam.  Head: Normocephalic and  atraumatic. Eyes: EOMI, conjunctivae nl, no scleral icterus.  Neck: Supple. Cardiovascular: RRR, no MRG. Pulmonary/Chest: normal effort, CTAB, no wheezes, rales, or rhonchi. Abdominal: Soft. NT/ND +BS. Musculoskeletal: FROM lumbar spine without tenderness. Normal strength and sensation.  Reflexes intact.   Neurological: A&O x3, cranial nerves II-XII are grossly intact, moving all extremities. Extremities: 2+DP b/l; no LE edema. Skin: Warm, dry and intact. No rash.   Assessment & Plan:   Assessment and plan was discussed and formulated with my attending.

## 2015-04-04 NOTE — Assessment & Plan Note (Signed)
Pt reports feeling more stressed out lately and fibromyalgia is flaring.  She is not sleeping well and has lumbar muscle spasms.  She is upset that her daughter (actually her niece who she refers to as her daughter) is not paying bills but continues to live with [name].  She is open to counseling.  She is requesting a few xanax for anxiety attacks.  She describes the attacks as feeling like she is having a "meltdown" and describes palpitations, diaphoresis, and feeling completely overwhelmed.  We discussed restarting celexa but she would like to try xanax with the understanding that this would only be used PRN and short term.  I discussed that xanax is not a long term solution and we will need to consider restarting celexa.  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  P: -will give #10 xanax PRN daily for short term only -flexeril qhs to help muscles spasms and sleep  -reminded not to use xanax when operating heavy equipment, etc.

## 2015-04-04 NOTE — Assessment & Plan Note (Addendum)
She is feeling more stressed and having anxiety. P: -xanax qd PRN anxiety attacks per above -referral to [facility] for counseling options

## 2015-04-04 NOTE — Assessment & Plan Note (Signed)
Pt c/o watery, itching, red eyes. P: -flonase PRN

## 2015-04-05 ENCOUNTER — Telehealth: Payer: Self-pay | Admitting: Licensed Clinical Social Worker

## 2015-04-05 NOTE — Telephone Encounter (Signed)
Jordan [name] was referred to CSW for counseling options.  During pt's [facility] appointment on 04/04/15, pt voiced feeling more stress and anxiety; open to counseling.  CSW placed call to Ms. Gammon to discuss options available under pt's current insurance.  CSW offered to provide pt with listing of providers or if patient would like referral to [facility].  Pt requesting CSW to complete referral to [facility].  CSW confirmed pt's best contact number and letter mailed with information on [facility] and [facility].  Pt denied add'l social work needs at this time.

## 2015-04-06 ENCOUNTER — Telehealth: Payer: Self-pay | Admitting: *Deleted

## 2015-04-06 NOTE — Telephone Encounter (Signed)
PATIENT CONTACTED WITH HER ORTHOPEDIC APPOINTMENT WITH DR [place].

## 2015-04-07 NOTE — Progress Notes (Signed)
Internal Medicine Clinic Attending  Case discussed with Dr. Gill soon after the resident saw the patient.  We reviewed the resident's history and exam and pertinent patient test results.  I agree with the assessment, diagnosis, and plan of care documented in the resident's note.  

## 2015-04-11 DIAGNOSIS — M545 Low back pain: Secondary | ICD-10-CM | POA: Diagnosis not present

## 2015-05-02 ENCOUNTER — Other Ambulatory Visit: Payer: Self-pay | Admitting: Internal Medicine

## 2015-05-02 NOTE — Telephone Encounter (Signed)
Pt needs refill lyrica. [facility] on [address]

## 2015-05-02 NOTE — Telephone Encounter (Addendum)
Pt contacted for additional information-she states that her "fibromyalgia" has "flared up". During her last OV, pt also stated that pcp wanted her to try a "muscle relaxer" first and she was advised to contact the clinic if the muscle relaxer did not help. Pt is requesting her lyrica be refilled, please advise.Jordan Hidden Cassady3/13/201712:11 PM

## 2015-05-03 ENCOUNTER — Other Ambulatory Visit: Payer: Self-pay | Admitting: Internal Medicine

## 2015-05-03 NOTE — Telephone Encounter (Signed)
Pt requesting Lyrica to be filled @ [facility].

## 2015-05-04 ENCOUNTER — Other Ambulatory Visit: Payer: Self-pay | Admitting: Internal Medicine

## 2015-05-04 DIAGNOSIS — M797 Fibromyalgia: Secondary | ICD-10-CM

## 2015-05-04 MED ORDER — PREGABALIN 75 MG PO CAPS: 75.0000 mg | ORAL_CAPSULE | Freq: Every day | ORAL | Status: DC

## 2015-05-04 NOTE — Telephone Encounter (Signed)
I have put in the order for lyrica 75mg  at bedtime.  Would you please call this in?  Thanks.

## 2015-05-06 ENCOUNTER — Ambulatory Visit (INDEPENDENT_AMBULATORY_CARE_PROVIDER_SITE_OTHER): Payer: Medicare Other | Admitting: Internal Medicine

## 2015-05-06 ENCOUNTER — Other Ambulatory Visit (HOSPITAL_COMMUNITY)
Admission: RE | Admit: 2015-05-06 | Discharge: 2015-05-06 | Disposition: A | Discharge status: Home / Self Care | Payer: Medicare Other | Source: Ambulatory Visit | Attending: Internal Medicine | Admitting: Internal Medicine

## 2015-05-06 ENCOUNTER — Encounter: Payer: Self-pay | Admitting: Internal Medicine

## 2015-05-06 VITALS — BP 99/54 | HR 63 | Temp 98.1°F | Ht 69.0 in | Wt 205.5 lb

## 2015-05-06 DIAGNOSIS — Z113 Encounter for screening for infections with a predominantly sexual mode of transmission: Secondary | ICD-10-CM | POA: Diagnosis not present

## 2015-05-06 DIAGNOSIS — N760 Acute vaginitis: Secondary | ICD-10-CM | POA: Diagnosis not present

## 2015-05-06 DIAGNOSIS — M797 Fibromyalgia: Secondary | ICD-10-CM

## 2015-05-06 DIAGNOSIS — N898 Other specified noninflammatory disorders of vagina: Secondary | ICD-10-CM | POA: Diagnosis present

## 2015-05-06 DIAGNOSIS — Z0000 Encounter for general adult medical examination without abnormal findings: Secondary | ICD-10-CM

## 2015-05-06 HISTORY — DX: Acute vaginitis: N76.0

## 2015-05-06 MED ORDER — TRAMADOL HCL 50 MG PO TABS: 50.0000 mg | ORAL_TABLET | Freq: Two times a day (BID) | ORAL | Status: DC | PRN

## 2015-05-06 MED ORDER — METRONIDAZOLE 500 MG PO TABS: 500.0000 mg | ORAL_TABLET | Freq: Two times a day (BID) | ORAL | Status: DC

## 2015-05-06 NOTE — Assessment & Plan Note (Signed)
A/P: Likely bacterial vaginosis.  No drainage from os or cervical motion tenderness, though cervix was friable. Patient's description most c/w BV, so will treat presumptively and follow up Wet Prep and GC/Chlamydia probes. - Metronidazole 500 mg PO BID x7 days. - RTC PRN
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
## 2015-05-06 NOTE — Patient Instructions (Signed)
1. Take Metronidazole 500 mg twice daily for 7 days. 2. Clean toys and use proper barrier protection during intercourse. 3. Call clinic if no improvement in 7 days.  Bacterial Vaginosis Bacterial vaginosis is a vaginal infection that occurs when the normal balance of bacteria in the vagina is disrupted. It results from an overgrowth of certain bacteria. This is the most common vaginal infection in women of childbearing age. Treatment is important to prevent complications, especially in pregnant women, as it can cause a premature delivery. CAUSES  Bacterial vaginosis is caused by an increase in harmful bacteria that are normally present in smaller amounts in the vagina. Several different kinds of bacteria can cause bacterial vaginosis. However, the reason that the condition develops is not fully understood. RISK FACTORS Certain activities or behaviors can put you at an increased risk of developing bacterial vaginosis, including:  Having a new sex partner or multiple sex partners.  Douching.  Using an intrauterine device (IUD) for contraception. Women do not get bacterial vaginosis from toilet seats, bedding, swimming pools, or contact with objects around them. SIGNS AND SYMPTOMS  Some women with bacterial vaginosis have no signs or symptoms. Common symptoms include:  Grey vaginal discharge.  A fishlike odor with discharge, especially after sexual intercourse.  Itching or burning of the vagina and vulva.  Burning or pain with urination. DIAGNOSIS  Your health care provider will take a medical history and examine the vagina for signs of bacterial vaginosis. A sample of vaginal fluid may be taken. Your health care provider will look at this sample under a microscope to check for bacteria and abnormal cells. A vaginal pH test may also be done.  TREATMENT  Bacterial vaginosis may be treated with antibiotic medicines. These may be given in the form of a pill or a vaginal cream. A second round  of antibiotics may be prescribed if the condition comes back after treatment. Because bacterial vaginosis increases your risk for sexually transmitted diseases, getting treated can help reduce your risk for chlamydia, gonorrhea, HIV, and herpes. HOME CARE INSTRUCTIONS   Only take over-the-counter or prescription medicines as directed by your health care provider.  If antibiotic medicine was prescribed, take it as directed. Make sure you finish it even if you start to feel better.  Tell all sexual partners that you have a vaginal infection. They should see their health care provider and be treated if they have problems, such as a mild rash or itching.  During treatment, it is important that you follow these instructions:  Avoid sexual activity or use condoms correctly.  Do not douche.  Avoid alcohol as directed by your health care provider.  Avoid breastfeeding as directed by your health care provider. SEEK MEDICAL CARE IF:   Your symptoms are not improving after 3 days of treatment.  You have increased discharge or pain.  You have a fever. MAKE SURE YOU:   Understand these instructions.  Will watch your condition.  Will get help right away if you are not doing well or get worse. FOR MORE INFORMATION  Centers for Disease Control and Prevention, Division of STD Prevention: AppraiserFraud.fi American Sexual Health Association (ASHA): www.ashastd.org    This information is not intended to replace advice given to you by your health care provider. Make sure you discuss any questions you have with your health care provider.   Document Released: 02/05/2005 Document Revised: 02/26/2014 Document Reviewed: 09/17/2012 Elsevier Interactive Patient Education [employer].

## 2015-05-06 NOTE — Telephone Encounter (Signed)
Rx called into [facility] (left on recorder).Regenia Skeeter, Leightyn Cina Cassady3/17/20179:12 AM

## 2015-05-09 LAB — CERVICOVAGINAL ANCILLARY ONLY
Chlamydia: NEGATIVE
Neisseria Gonorrhea: NEGATIVE
Wet Prep (BD Affirm): POSITIVE — AB

## 2015-05-11 ENCOUNTER — Encounter (HOSPITAL_COMMUNITY): Payer: Self-pay | Admitting: Clinical

## 2015-05-11 ENCOUNTER — Ambulatory Visit (INDEPENDENT_AMBULATORY_CARE_PROVIDER_SITE_OTHER): Payer: Medicare Other | Admitting: Clinical

## 2015-05-11 DIAGNOSIS — F3162 Bipolar disorder, current episode mixed, moderate: Secondary | ICD-10-CM | POA: Diagnosis not present

## 2015-05-12 NOTE — Progress Notes (Signed)
Internal Medicine Clinic Attending  Case discussed with Dr. Taylor at the time of the visit.  We reviewed the resident's history and exam and pertinent patient test results.  I agree with the assessment, diagnosis, and plan of care documented in the resident's note. 
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
## 2015-05-24 ENCOUNTER — Ambulatory Visit (HOSPITAL_COMMUNITY): Payer: Self-pay | Admitting: Clinical

## 2015-05-25 ENCOUNTER — Ambulatory Visit (HOSPITAL_COMMUNITY): Payer: Medicare Other | Admitting: Clinical

## 2015-05-30 ENCOUNTER — Telehealth: Payer: Self-pay | Admitting: Internal Medicine

## 2015-05-30 NOTE — Telephone Encounter (Signed)
APPT. REMINDER CALL, LMTCB °

## 2015-05-31 ENCOUNTER — Encounter (HOSPITAL_COMMUNITY): Payer: Self-pay | Admitting: Clinical

## 2015-05-31 ENCOUNTER — Ambulatory Visit (INDEPENDENT_AMBULATORY_CARE_PROVIDER_SITE_OTHER): Payer: Medicare Other | Admitting: Clinical

## 2015-05-31 DIAGNOSIS — F3162 Bipolar disorder, current episode mixed, moderate: Secondary | ICD-10-CM | POA: Diagnosis not present

## 2015-05-31 NOTE — Progress Notes (Signed)
   THERAPIST PROGRESS NOTE  Session Time: 7:07 -8:05  Participation Level: Active  Behavioral Response: CasualAlertDepressed  Type of Therapy: Individual Therapy  Treatment Goals addressed: improve psychiatric symptoms, controlled behavior, mood, and speech (improved social and work functioning), improve unhelpful thought patterns, elevate mood (increased hopefulness and social interactions), reduce impulsive behavior, interpersonal relationship skills  Interventions: Motivational Interviewing, CBT, Grounding and Mindfulness Techniques, psychoeducation  Summary: Jordan [name] is a 35 y.o. female who presents with Moderate mixed bipolar I disorder.   Suicidal/Homicidal: No -without intent/plan  Therapist Response:  [name] met with clinician for an individual session. [name] discussed her psychiatric symptoms, her current life events and her goals for therapy. [name] shared that she was having a diffcult week. She shared that she has been depressed which she shared was increased by her physical pain. She shared about her symptoms and her desire to improve. She shared that she also had some manic symptoms. Client and clinician discussed her diagnosis. Clinician gave [name] a homework packet on Bi-polar and [name] agreed to complete it and bring it back with her to next session. Clinician introduced some grounding and mindfulness techniques. Clinician explained the process, purpose of the techniques. Client and clinician practiced some of the techniques together. [name] shared some about her  Impulsive behavior and her negative thoughts. Clinician introduced some basic cbt concepts. Client and clinician discussed how our thought inform our emotions, perceptions, and behaviors. Client and clinician agreed to discuss this further at future sessions. [name] agreed to practice her grounding and mindfulness techniques daily until next session.  Plan: Return again in 1 week.  Diagnosis: Axis I:  Moderate mixed bipolar I disorder.   Bradon Fester A, LCSW 05/31/2015

## 2015-06-01 ENCOUNTER — Ambulatory Visit: Payer: Self-pay | Admitting: Internal Medicine

## 2015-06-07 ENCOUNTER — Ambulatory Visit (HOSPITAL_COMMUNITY): Payer: Self-pay | Admitting: Clinical

## 2015-06-10 ENCOUNTER — Telehealth: Payer: Self-pay | Admitting: Internal Medicine

## 2015-06-10 NOTE — Telephone Encounter (Signed)
APPT. REMINDER CALL, LMTCB °

## 2015-06-13 ENCOUNTER — Ambulatory Visit (INDEPENDENT_AMBULATORY_CARE_PROVIDER_SITE_OTHER): Payer: Medicare Other | Admitting: Internal Medicine

## 2015-06-13 ENCOUNTER — Encounter: Payer: Self-pay | Admitting: Internal Medicine

## 2015-06-13 VITALS — BP 93/54 | HR 55 | Temp 98.2°F | Ht 69.0 in | Wt 203.1 lb

## 2015-06-13 DIAGNOSIS — N898 Other specified noninflammatory disorders of vagina: Secondary | ICD-10-CM | POA: Diagnosis not present

## 2015-06-13 DIAGNOSIS — R5382 Chronic fatigue, unspecified: Secondary | ICD-10-CM

## 2015-06-13 DIAGNOSIS — N760 Acute vaginitis: Secondary | ICD-10-CM

## 2015-06-13 DIAGNOSIS — M797 Fibromyalgia: Secondary | ICD-10-CM | POA: Diagnosis not present

## 2015-06-13 MED ORDER — METRONIDAZOLE 0.75 % VA GEL: 1.0000 | Freq: Every day | VAGINAL | Status: DC

## 2015-06-13 MED ORDER — PREGABALIN 75 MG PO CAPS: 75.0000 mg | ORAL_CAPSULE | Freq: Every day | ORAL | Status: DC
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
## 2015-06-13 NOTE — Progress Notes (Signed)
Case discussed with Dr. Gill soon after the resident saw the patient.  We reviewed the resident's history and exam and pertinent patient test results.  I agree with the assessment, diagnosis and plan of care documented in the resident's note. 

## 2015-06-13 NOTE — Assessment & Plan Note (Addendum)
Pt has been off of lyrica but is having more pains in the hands and feet and would like to go back on lyrica at a lower dose.  -refilled lyrica 75mg  daily

## 2015-06-13 NOTE — Assessment & Plan Note (Addendum)
Still has c/o clear vaginal discharge.  Denies itching, burning, dysuria.  Has been ongoing for the past month and was given metronidazole which she completed treatment and symptoms improved.  However, her discharge has returned.  Wet prep last OV +BV.  -will prescribe metronidazole vaginal gel x 5 days

## 2015-06-13 NOTE — Patient Instructions (Signed)
Thank you for your visit today.   Please return to the internal medicine clinic in 6-8 weeks or sooner if needed.     I have made the following additions/changes to your medications: Please take lyrica 75mg  once daily.   I have sent a prescription to your pharmacy for a vaginal gel to be taken once daily for 5 days.    Please be sure to bring all of your medications with you to every visit; this includes herbal supplements, vitamins, eye drops, and any over-the-counter medications.   Should you have any questions regarding your medications and/or any new or worsening symptoms, please be sure to call the clinic at [phone].   If you believe that you are suffering from a life threatening condition or one that may result in the loss of limb or function, then you should call 911 and proceed to the nearest Emergency Department.   A healthy lifestyle and preventative care can promote health and wellness.   Maintain regular health, dental, and eye exams.  Eat a healthy diet. Foods like vegetables, fruits, whole grains, low-fat dairy products, and lean protein foods contain the nutrients you need without too many calories. Decrease your intake of foods high in solid fats, added sugars, and salt. Get information about a proper diet from your caregiver, if necessary.  Regular physical exercise is one of the most important things you can do for your health. Most adults should get at least 150 minutes of moderate-intensity exercise (any activity that increases your heart rate and causes you to sweat) each week. In addition, most adults need muscle-strengthening exercises on 2 or more days a week.   Maintain a healthy weight. The body mass index (BMI) is a screening tool to identify possible weight problems. It provides an estimate of body fat based on height and weight. Your caregiver can help determine your BMI, and can help you achieve or maintain a healthy weight. For adults 20 years and  older:  A BMI below 18.5 is considered underweight.  A BMI of 18.5 to 24.9 is normal.  A BMI of 25 to 29.9 is considered overweight.  A BMI of 30 and above is considered obese.

## 2015-06-14 ENCOUNTER — Ambulatory Visit (INDEPENDENT_AMBULATORY_CARE_PROVIDER_SITE_OTHER): Payer: Medicare Other | Admitting: Clinical

## 2015-06-14 ENCOUNTER — Encounter (HOSPITAL_COMMUNITY): Payer: Self-pay | Admitting: Clinical

## 2015-06-14 DIAGNOSIS — F3162 Bipolar disorder, current episode mixed, moderate: Secondary | ICD-10-CM | POA: Diagnosis not present

## 2015-06-14 NOTE — Progress Notes (Signed)
   THERAPIST PROGRESS NOTE  Session Time: 7:08 -8:06  Participation Level: Active  Behavioral Response: CasualAlertAnxious and Depressed  Type of Therapy: Individual Therapy  Treatment Goals addressed: improve psychiatric symptoms, controlled behavior, mood, and speech (improved social and work functioning), improve unhelpful thought patterns, elevate mood (increased hopefulness and social interactions), and reduce impulsive behavior, interpersonal relationship skills  Interventions: Motivational Interviewing, CBT, Mindfulness Techniques   Summary: Jordan [name] is a 35 y.o. female who presents with Moderate mixed bipolar I disorder.   Suicidal/Homicidal: No -without intent/plan  Therapist Response:  [name] met with clinician for an individual session. [name] discussed her psychiatric symptoms, her current life events and her homework. [name] shared that she has been having ups and downs since last session and has not been consistent in using healthy coping skills. Client and clinician discussed her symptoms of both mania and depression. She also has been dealing with a lot of confusing emotions. She shared one the significant people in her life has been told she has a terminal disease. She shared that her niece ( who lives with her and is suppose to move out) also found out that she is having major health issues. [name] shared how the news has been affecting her emotions and psychiatric symptoms. Client and clinician discussed how she has been coping with her emotions and symptoms. Client and clinician discussed her thoughts and insights about her choice of coping. Client and clinician discussed her thoughts about the news. [name] had the insight that her coping was do to avoiding emotions. Client and clinician discussed acceptance and how to feel emotions. Client and clinician practiced being mindful of her emotions. Client and clinician discussed the cost and benefit of feeling her natural  emotions while decreasing additional created narratives. Client and clinician discussed her interactions with the people the news affected the most and her desired outcomes. [name] was able to identify some behavior changes that would help her with her interpersonal relationships. [name] and clinician reviewed and discussed her homework (Bipolar #3)  She agreed to complete the next packet before next session.   Plan: Return again in 1 week.  Diagnosis: Axis I: Moderate mixed bipolar I disorder.   Johanna Matto A, LCSW 06/14/2015

## 2015-06-23 ENCOUNTER — Ambulatory Visit (INDEPENDENT_AMBULATORY_CARE_PROVIDER_SITE_OTHER): Payer: Medicare Other | Admitting: Psychiatry

## 2015-06-23 ENCOUNTER — Encounter (HOSPITAL_COMMUNITY): Payer: Self-pay | Admitting: Psychiatry

## 2015-06-23 DIAGNOSIS — F3132 Bipolar disorder, current episode depressed, moderate: Secondary | ICD-10-CM | POA: Diagnosis not present

## 2015-06-23 DIAGNOSIS — F419 Anxiety disorder, unspecified: Secondary | ICD-10-CM | POA: Diagnosis not present

## 2015-06-23 MED ORDER — DIVALPROEX SODIUM ER 250 MG PO TB24: ORAL_TABLET | ORAL | Status: DC
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
## 2015-07-04 ENCOUNTER — Encounter: Payer: Self-pay | Admitting: Internal Medicine

## 2015-07-04 ENCOUNTER — Ambulatory Visit (INDEPENDENT_AMBULATORY_CARE_PROVIDER_SITE_OTHER): Payer: Medicare Other | Admitting: Internal Medicine

## 2015-07-04 VITALS — BP 105/53 | HR 65 | Temp 98.2°F | Resp 18 | Ht 69.0 in | Wt 202.1 lb

## 2015-07-04 DIAGNOSIS — Q8789 Other specified congenital malformation syndromes, not elsewhere classified: Secondary | ICD-10-CM | POA: Diagnosis not present

## 2015-07-04 DIAGNOSIS — F319 Bipolar disorder, unspecified: Secondary | ICD-10-CM | POA: Insufficient documentation

## 2015-07-04 DIAGNOSIS — M797 Fibromyalgia: Secondary | ICD-10-CM | POA: Diagnosis not present

## 2015-07-04 DIAGNOSIS — N760 Acute vaginitis: Secondary | ICD-10-CM | POA: Diagnosis not present

## 2015-07-04 NOTE — Assessment & Plan Note (Signed)
Pt just started back on lyrica on Thursday but is still having pains.   -cont lyrica

## 2015-07-04 NOTE — Patient Instructions (Signed)
Thank you for your visit today.   Please return to the internal medicine clinic in about 6 weeks. Please call orthopedics and ask for an appointment, you do not need a referral. Also, I will refer you to gynecology for your continued discharge.  Please be sure to bring all of your medications with you to every visit; this includes herbal supplements, vitamins, eye drops, and any over-the-counter medications.   Should you have any questions regarding your medications and/or any new or worsening symptoms, please be sure to call the clinic at [phone].   If you believe that you are suffering from a life threatening condition or one that may result in the loss of limb or function, then you should call 911 and proceed to the nearest Emergency Department.   A healthy lifestyle and preventative care can promote health and wellness.   Maintain regular health, dental, and eye exams.  Eat a healthy diet. Foods like vegetables, fruits, whole grains, low-fat dairy products, and lean protein foods contain the nutrients you need without too many calories. Decrease your intake of foods high in solid fats, added sugars, and salt. Get information about a proper diet from your caregiver, if necessary.  Regular physical exercise is one of the most important things you can do for your health. Most adults should get at least 150 minutes of moderate-intensity exercise (any activity that increases your heart rate and causes you to sweat) each week. In addition, most adults need muscle-strengthening exercises on 2 or more days a week.   Maintain a healthy weight. The body mass index (BMI) is a screening tool to identify possible weight problems. It provides an estimate of body fat based on height and weight. Your caregiver can help determine your BMI, and can help you achieve or maintain a healthy weight. For adults 20 years and older:  A BMI below 18.5 is considered underweight.  A BMI of 18.5 to 24.9 is  normal.  A BMI of 25 to 29.9 is considered overweight.  A BMI of 30 and above is considered obese.

## 2015-07-04 NOTE — Assessment & Plan Note (Addendum)
She reports still having vaginal discharge that cleared up with the metrogel but then returned.  Denies vaginal itching, burning, or smell.  No history of STIs.  No fever/chills.  Last pap smear was normal in 2015.  She is concerned about the continued discharge and requests referral to gyn.   -referral to gyn

## 2015-07-04 NOTE — Assessment & Plan Note (Signed)
-  f/u with Dr. Patrina Levering in 1 yr, will obtain records
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
## 2015-07-05 ENCOUNTER — Ambulatory Visit (HOSPITAL_COMMUNITY): Payer: Self-pay | Admitting: Clinical

## 2015-07-05 LAB — CBC WITH DIFFERENTIAL/PLATELET
Basophils Absolute: 0 10*3/uL (ref 0.0–0.2)
Basos: 1 %
EOS (ABSOLUTE): 0.1 10*3/uL (ref 0.0–0.4)
Eos: 2 %
Hematocrit: 37.4 % (ref 34.0–46.6)
Hemoglobin: 12.5 g/dL (ref 11.1–15.9)
Immature Grans (Abs): 0 10*3/uL (ref 0.0–0.1)
Immature Granulocytes: 0 %
Lymphocytes Absolute: 2.6 10*3/uL (ref 0.7–3.1)
Lymphs: 55 %
MCH: 28.9 pg (ref 26.6–33.0)
MCHC: 33.4 g/dL (ref 31.5–35.7)
MCV: 86 fL (ref 79–97)
Monocytes Absolute: 0.4 10*3/uL (ref 0.1–0.9)
Monocytes: 9 %
Neutrophils Absolute: 1.5 10*3/uL (ref 1.4–7.0)
Neutrophils: 33 %
Platelets: 215 10*3/uL (ref 150–379)
RBC: 4.33 x10E6/uL (ref 3.77–5.28)
RDW: 14 % (ref 12.3–15.4)
WBC: 4.7 10*3/uL (ref 3.4–10.8)

## 2015-07-05 LAB — CMP14 + ANION GAP
ALT: 6 IU/L (ref 0–32)
AST: 12 IU/L (ref 0–40)
Albumin/Globulin Ratio: 1.7 (ref 1.2–2.2)
Albumin: 4 g/dL (ref 3.5–5.5)
Alkaline Phosphatase: 45 IU/L (ref 39–117)
Anion Gap: 15 mmol/L (ref 10.0–18.0)
BUN/Creatinine Ratio: 12 (ref 9–23)
BUN: 9 mg/dL (ref 6–20)
Bilirubin Total: 0.3 mg/dL (ref 0.0–1.2)
CO2: 23 mmol/L (ref 18–29)
Calcium: 9.1 mg/dL (ref 8.7–10.2)
Chloride: 102 mmol/L (ref 96–106)
Creatinine, Ser: 0.76 mg/dL (ref 0.57–1.00)
GFR calc Af Amer: 118 mL/min/{1.73_m2} (ref >59)
GFR calc non Af Amer: 102 mL/min/{1.73_m2} (ref >59)
Globulin, Total: 2.4 g/dL (ref 1.5–4.5)
Glucose: 79 mg/dL (ref 65–99)
Potassium: 4.1 mmol/L (ref 3.5–5.2)
Sodium: 140 mmol/L (ref 134–144)
Total Protein: 6.4 g/dL (ref 6.0–8.5)

## 2015-07-06 NOTE — Progress Notes (Signed)
Internal Medicine Clinic Attending  Case discussed with Dr. Gill soon after the resident saw the patient.  We reviewed the resident's history and exam and pertinent patient test results.  I agree with the assessment, diagnosis, and plan of care documented in the resident's note.  

## 2015-07-07 ENCOUNTER — Encounter (HOSPITAL_COMMUNITY): Payer: Self-pay | Admitting: Clinical

## 2015-07-07 ENCOUNTER — Ambulatory Visit (INDEPENDENT_AMBULATORY_CARE_PROVIDER_SITE_OTHER): Payer: Medicare Other | Admitting: Clinical

## 2015-07-07 DIAGNOSIS — F3162 Bipolar disorder, current episode mixed, moderate: Secondary | ICD-10-CM | POA: Diagnosis not present

## 2015-07-07 NOTE — Progress Notes (Signed)
THERAPIST PROGRESS NOTE  Session Time: 8:00 - 8:55   Participation Level: Active  Behavioral Response: CasualAlertAnxious and Depressed  Type of Therapy: Individual Therapy  Treatment Goals addressed: improve psychiatric symptoms, controlled behavior, mood, and speech (improved social and work functioning), improve unhelpful thought patterns, elevate mood (increased hopefulness), and reduce impulsive behavior,   Interventions: Motivational Interviewing, CBT, Grounding   Summary: Jordan [name] is a 35 y.o. female who presents with Moderate mixed bipolar I disorder.   Suicidal/Homicidal: No -without intent/plan  Therapist Response:  [name] met with clinician for an individual session. Tennelle discussed her psychiatric symptoms, her current life events and her homework. Zeola shared that she didn't do her homework packet - but plans to complete it and bring it to next session. She shared that she hasn't been consistent in practicing her grounding techniques, but when she does practice them they are helpful. She shared that she experienced an panic attack at work and that along with support from a friend made her take her medication. Client and clinician discussed her panic attack. She shared that she has been really struggling with her diagnosis and the need for her to take her medication. Clinician asked open ended questions. Audyn shared about her symptoms and the difficulty she has experienced because of them. She also shared here worries about taking the medication. Client and clinician discussed the process of working with her psychiatrist if her medications have negative affects. Client and clinician discussed the stigma versus reality of Bipolar disorder. [name] and clinician discussed the fact that  good sleep, exercise, and eating healthy would also benefit her mental and physical health. Latishia stated she would take her meds and track her mood. She agreed to complete her homework  packet and practice her grounding techniques.  Plan: Return again in 1 week.  Diagnosis: Axis I: Moderate mixed bipolar I disorder.    Alyah Boehning A, LCSW 07/07/2015

## 2015-07-12 ENCOUNTER — Ambulatory Visit (HOSPITAL_COMMUNITY): Payer: Self-pay | Admitting: Clinical

## 2015-07-14 ENCOUNTER — Ambulatory Visit (INDEPENDENT_AMBULATORY_CARE_PROVIDER_SITE_OTHER): Payer: Medicare Other | Admitting: Psychiatry

## 2015-07-14 ENCOUNTER — Encounter (HOSPITAL_COMMUNITY): Payer: Self-pay | Admitting: Psychiatry

## 2015-07-14 VITALS — BP 100/60 | HR 68 | Ht 69.0 in | Wt 202.4 lb

## 2015-07-14 DIAGNOSIS — Z79899 Other long term (current) drug therapy: Secondary | ICD-10-CM

## 2015-07-14 DIAGNOSIS — F3132 Bipolar disorder, current episode depressed, moderate: Secondary | ICD-10-CM

## 2015-07-14 MED ORDER — DIVALPROEX SODIUM ER 500 MG PO TB24: 500.0000 mg | ORAL_TABLET | Freq: Every day | ORAL | Status: DC
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
## 2015-07-19 ENCOUNTER — Ambulatory Visit (INDEPENDENT_AMBULATORY_CARE_PROVIDER_SITE_OTHER): Payer: Medicare Other | Admitting: Clinical

## 2015-07-19 DIAGNOSIS — F3162 Bipolar disorder, current episode mixed, moderate: Secondary | ICD-10-CM | POA: Diagnosis not present

## 2015-07-19 NOTE — Progress Notes (Signed)
   THERAPIST PROGRESS NOTE  Session Time: 4:18 - 5:20  Participation Level: Active  Behavioral Response: CasualAlertDepressed  Type of Therapy: Individual Therapy  Treatment Goals addressed: improve psychiatric symptoms, controlled behavior, mood, improve unhelpful thought patterns, and reduce impulsive behavior, interpersonal relationship skills  Interventions: Motivational Interviewing, CBT, Grounding and Mindfulness Techniques   Summary: Jordan [name] is a 34 y.o. female who presents with Moderate mixed bipolar I disorder.   Suicidal/Homicidal: No -without intent/plan  Therapist Response:  [name] met with clinician for an individual session. [name] discussed her psychiatric symptoms, her current life events and her homework. [name] shared that she had completed her homework packet. Client clinician reviewed and discussed her homework packet clinician gave her the next packet in the series - bipolar - keeping her balance. Gender agreed to complete and bring the packet back with her to next session. [name] shared that she decided to take her medication and has also found a way to accept that they are necessary at this time. Client and clinician discussed her thoughts and insights a bout taking her medication. [name] shared that her acceptance is made it easier and that she recognize that she was more in control and happier when taking her medication. [name] shared that there were others in her family who have bipolar disorder and her insight that her resistance to the diagnosis and taking her medication might be willing to not wanting to become these family members. Clinician asked open ended questions and can to identify differences and similarities between them and her. Client and clinician discussed how accepting and managing her bipolar disorder would allow her to be true to herself. [name] also shared that her niece whom she raised is going to have surgery on Thursday and that she is very  anxious. Client and clinician discussed natural emotions and though it's difficult for her to cry it may be completely appropriate if she felt the need. [name] shared that she has a friend who is supportive. Client and clinician discussed how being honest and vulnerable with people who might contrast build relationships. [name] and clinician agreed to discuss this further at future sessions. Cam and clinician also practiced a new mindfulness technique. [name] agreed to practice her grounding and mindfulness techniques daily. She shared that she finds them helpful.        Plan: Return again in 1 week.  Diagnosis: Axis I: Moderate mixed bipolar I disorder.    [name], LCSW 07/19/2015

## 2015-07-21 ENCOUNTER — Encounter (HOSPITAL_COMMUNITY): Payer: Self-pay | Admitting: Clinical

## 2015-07-22 DIAGNOSIS — M545 Low back pain: Secondary | ICD-10-CM | POA: Diagnosis not present

## 2015-07-26 ENCOUNTER — Encounter (HOSPITAL_COMMUNITY): Payer: Self-pay | Admitting: Clinical

## 2015-07-26 ENCOUNTER — Ambulatory Visit (INDEPENDENT_AMBULATORY_CARE_PROVIDER_SITE_OTHER): Payer: Medicare Other | Admitting: Clinical

## 2015-07-26 DIAGNOSIS — F3162 Bipolar disorder, current episode mixed, moderate: Secondary | ICD-10-CM

## 2015-07-26 NOTE — Progress Notes (Signed)
   THERAPIST PROGRESS NOTE  Session Time: 4:42 - 5:40  Participation Level: Active  Behavioral Response: CasualAlertNA  Type of Therapy: Individual Therapy  Treatment Goals addressed: improve psychiatric symptoms, controlled behavior, mood, and speech (improved social and work functioning), improve unhelpful thought patterns, elevate mood (increased hopefulness ),  interpersonal relationship skills  Interventions: Motivational Interviewing, CBT, Grounding and Mindfulness Techniques   Summary: Jordan [name] is a 35 y.o. female who presents with Moderate mixed bipolar I disorder.   Suicidal/Homicidal: No -without intent/plan  Therapist Response:  [name] met with clinician for an individual session. [name] discussed her psychiatric symptoms, her current life events and her homework. [name] shared she has experienced some emotions which are uncomfortable for her to experience. Client and clinician discussed her thoughts and emotions. [name] shared some of her negative automatic thoughts (around her diagnosis). Client and clinician used a 7 panel thought record sheet (cbt). Clinician asked open ended questions and [name] explored the evidence for and against the thoughts. [name] was then able to formulate healthier alternative thoughts. [name] shared that she would like to them best in the bigger alternative thoughts has they took a way feelings of stigma and punishment and helped her to feel more hopeful. [name] shared that she has a new position at work which she is very excited about because she both gets to have some time alone and interact with others which she finds helpful for maintaining a better mood. Client and clinician reviewed bipolar packet #3 and [name] took home with her to complete before next session packet number #4. [name] shared that her niece is going to have surgery on the 21st. [name] shared a bout the emotion she is challenged with experiencing. Clinician asked open ended  questions. [name] recognize that her emotions were valid and alignment with the situation. Client and clinician discussed how [name] could be present for her niece without being overwhelmed. [name] shared she is recognizing progress in herself and this has been validated by her friend. Gender agreed to continue practicing her grounding and mindfulness techniques until next session.  Plan: Return again in 1 week.  Diagnosis: Axis I: Moderate mixed bipolar I disorder.    Matti Minney A, LCSW 07/26/2015

## 2015-07-29 ENCOUNTER — Encounter: Payer: Self-pay | Admitting: *Deleted

## 2015-08-02 ENCOUNTER — Ambulatory Visit (HOSPITAL_COMMUNITY): Payer: Self-pay | Admitting: Clinical

## 2015-08-09 ENCOUNTER — Ambulatory Visit: Payer: Medicare Other | Attending: Internal Medicine | Admitting: Physical Therapy

## 2015-08-09 ENCOUNTER — Encounter: Payer: Self-pay | Admitting: Physical Therapy

## 2015-08-09 DIAGNOSIS — M6281 Muscle weakness (generalized): Secondary | ICD-10-CM | POA: Insufficient documentation

## 2015-08-09 DIAGNOSIS — M545 Low back pain, unspecified: Secondary | ICD-10-CM

## 2015-08-09 DIAGNOSIS — M25651 Stiffness of right hip, not elsewhere classified: Secondary | ICD-10-CM | POA: Diagnosis not present

## 2015-08-09 DIAGNOSIS — R293 Abnormal posture: Secondary | ICD-10-CM | POA: Insufficient documentation

## 2015-08-09 NOTE — Therapy (Signed)
[facility] [address] [place], [place], [address] Phone: [phone]   Fax:  [phone]  Physical Therapy Evaluation  Patient Details  Name: Jordan [name] MRN: [id number] Date of Birth: May 23, 1980 Referring Provider: Loraine Leriche L. Yevette Edwards, MD  Encounter Date: 08/09/2015      PT End of Session - 08/09/15 1456    Visit Number 1   Number of Visits 13   Date for PT Re-Evaluation 09/23/15   Authorization Type medicare- KX at 15   PT Start Time 1415   PT Stop Time 1451   PT Time Calculation (min) 36 min   Activity Tolerance Patient tolerated treatment well   Behavior During Therapy [facility] for tasks assessed/performed      Past Medical History  Diagnosis Date  . Basal cell carcinoma     recurrent in left maxillary, has had 5 surguries, last one 2003  . Fibroma     left ovarian  . Cyst of nasal sinus   . Candidiasis, vagina     recurrent, pt has made lifestyle modifications and none recently (as of 8/12)  . Gardnerella infection     + in 08/08  . [place]     hx of herpetic requiring I+D,( was bitten by autistic child that cares fr  . Allergic rhinitis     pt takes flonase for episodes, no episodes in 2012  . PLANTAR FASCIITIS, BILATERAL 05/24/2009  . Paronychia of third finger of left hand 07/28/2010  . Fibromyalgia   . Herniated disc   . Anxiety   . GERD (gastroesophageal reflux disease)     occ  . OVARIAN CYSTECTOMY, HX OF 03/01/2006    ovarian fibroma-left 1996, L ovary and fallopian tube removed      Past Surgical History  Procedure Laterality Date  . Left oophorectomy    . Cholecystectomy    . Nasal sinus surgery      X 2 at age 12 & 43  . Mandible reconstruction  92    upper anfd lower jaw cyst  . Lumbar laminectomy/decompression microdiscectomy N/A 08/14/2012    Procedure: LUMBAR LAMINECTOMY/DECOMPRESSION MICRODISCECTOMY Lumbar 5 -sacrum 1 decompression;  Surgeon: Emilee Hero, MD;  Location: [facility];   Service: Orthopedics;  Laterality: N/A;  Lumbar 5 -sacrum 1 decompression    There were no vitals filed for this visit.       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 When tanks are empty she is pushing and pulling and cleaning out tanks. Spasm began 3 weeks ago.    Limitations Other (comment)  laying down   Patient Stated Goals sleep, push/pull for work   Currently in Pain? Yes   Pain Score 3    Pain Location Back   Pain Orientation Right   Pain Descriptors / Indicators Sore;Spasm   Pain Type Acute pain   Pain Onset 1 to 4 weeks ago   Aggravating Factors  stretching, pushing/pulling, laying down and rolling over   Pain Relieving Factors medications, rest            [facility] PT Assessment - 08/09/15 0001    Assessment   Medical Diagnosis LBP   Referring Provider Mark L. Dumonski, MD   Hand Dominance Right   Next MD Visit unknown   Prior Therapy not this year   Precautions   Precautions None   Restrictions   Weight Bearing Restrictions No  Balance Screen   Has the patient fallen in the past 6 months No   Home Environment   Living Environment Private residence   Living Arrangements Other relatives   Home Access Stairs to enter   Prior Function   Level of Independence Independent   Cognition   Overall Cognitive Status Within Functional Limits for tasks assessed   Observation/Other Assessments   Focus on Therapeutic Outcomes (FOTO)  49% disability   ROM / Strength   AROM / PROM / Strength AROM;Strength   AROM   AROM Assessment Site Lumbar   Lumbar Flexion 60   Lumbar Extension 10   Strength   Strength Assessment Site Hip   Right/Left Hip Right   Right Hip Flexion 4-/5   Right Hip Extension 3+/5   Right Hip ABduction 3/5   Right Hip ADduction 3+/5                   [facility] Adult PT Treatment/Exercise - 08/09/15 0001    Exercises   Exercises Lumbar;Knee/Hip   Lumbar Exercises:  Stretches   Lower Trunk Rotation Other (comment)  x15 ea   Lumbar Exercises: Standing   Other Standing Lumbar Exercises pushing/pulling sleigh  verbal and tactile cuing following demonstration   Lumbar Exercises: Supine   Ab Set 20 reps;3 seconds  with ball squeeze   Knee/Hip Exercises: Stretches   Piriformis Stretch 2 reps;30 seconds   Manual Therapy   Manual therapy comments STM and trigger point release R QL                PT Education - 08/09/15 1455    Education provided Yes   Education Details anatomy of condition, POC, HEP, pushing/pulling posture   Person(s) Educated Patient   Methods Explanation;Demonstration;Tactile cues;Verbal cues;Handout   Comprehension Verbalized understanding;Returned demonstration;Verbal cues required;Tactile cues required;Need further instruction          PT Short Term Goals - 08/09/15 1504    PT SHORT TERM GOAL #1   Title able to sleep through the night without being woken by spasm by 7/11   Time 3   Period Weeks   Status New   PT SHORT TERM GOAL #2   Title able to perform exercises incoorporating core and extremities with good control.    Time 3   Period Weeks   Status New           PT Long Term Goals - 08/09/15 1505    PT LONG TERM GOAL #1   Title able to demonstrate push/pull posture using sleigh with at least 50lb to immitate work-related activities by 8/4   Time 6   Period Weeks   Status New   PT LONG TERM GOAL #2   Title Independent in HEP to continue improving core control following PT.    Time 6   Period Weeks   Status New   PT LONG TERM GOAL #3   Title resolution of spasm to decrease limitations during daily activities.    Time 6   Period Weeks   Status New   PT LONG TERM GOAL #4   Title FOTO improved from 49% disability to 34% disability to indicate significant functional improvement.    Time 6   Period Weeks   Status New               Plan - 08/09/15 1457    Clinical Impression Statement  pt presents to PT with complaints of R-sided LBP that began since she  started a job in which she is pushing and pulling heavy tanks. Notable trigger points in R QL that recreated concordant pain and pt reported feeling much better following manual techniques and exercises.  When pt demo pulling posture her UE were extended but sat into an appropriate squat resulting in overuse of low back extensors. Will benefit from skilled PT in order to improve core control as well as postural alignment for work-related activities.    Rehab Potential Good   PT Frequency 2x / week   PT Duration 6 weeks   PT Treatment/Interventions ADLs/Self Care Home Management;Electrical Stimulation;Cryotherapy;Ultrasound;Moist Heat;Iontophoresis 4mg /ml Dexamethasone;Gait training;Stair training;Functional mobility training;Therapeutic activities;Therapeutic exercise;Balance training;Patient/family education;Neuromuscular re-education;Manual techniques;Taping;Dry needling;Passive range of motion   PT Next Visit Plan core stabilization, pushing/pulling/lifting posture, manual decrease to QL   PT Home Exercise Plan ab set with ball squeeze, tennis ball trigger point release.    Consulted and Agree with Plan of Care Patient      Patient will benefit from skilled therapeutic intervention in order to improve the following deficits and impairments:  Pain, Improper body mechanics, Increased muscle spasms, Decreased strength  Visit Diagnosis: Right-sided low back pain without sciatica - Plan: PT plan of care cert/re-cert      G-Codes - 2015-08-20 1517    Functional Limitation Mobility: Walking and moving around   Mobility: Walking and Moving Around Current Status [phone]) At least 40 percent but less than 60 percent impaired, limited or restricted   Mobility: Walking and Moving Around Goal Status [phone]) At least 20 percent but less than 40 percent impaired, limited or restricted       Problem List Patient Active Problem List    Diagnosis Date Noted  . Bipolar disorder (HCC) 07/04/2015  . Vaginitis 05/06/2015  . Furuncle 03/18/2015  . Idiopathic hypotension 02/03/2015  . At risk for abnormal blood glucose level 12/15/2013  . Preventative health care 12/19/2011  . Fibromyalgia 06/26/2010  . Chronic fatigue and depression 06/26/2010  . Obesity 01/17/2010    Class: Chronic  . Lumbar disc herniation of L5-S1 on the left side (MRI 2012 s/p surgery)  02/26/2008  . Allergic rhinitis 07/24/2006  . 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 Valentine Kuechle PT, DPT 2015/08/20 3:18 PM   [facility] [address] [place], [place], [address] Phone: [phone]   Fax:  [phone]  Name: Jordan [name] MRN: [id number] Date of Birth: 05-Apr-1980

## 2015-08-15 ENCOUNTER — Ambulatory Visit: Payer: Medicare Other | Admitting: Physical Therapy

## 2015-08-15 DIAGNOSIS — M545 Low back pain, unspecified: Secondary | ICD-10-CM

## 2015-08-15 DIAGNOSIS — M6281 Muscle weakness (generalized): Secondary | ICD-10-CM | POA: Diagnosis not present

## 2015-08-15 DIAGNOSIS — R293 Abnormal posture: Secondary | ICD-10-CM | POA: Diagnosis not present

## 2015-08-15 DIAGNOSIS — M25651 Stiffness of right hip, not elsewhere classified: Secondary | ICD-10-CM | POA: Diagnosis not present

## 2015-08-15 NOTE — Patient Instructions (Signed)
Gave patient handout for core stab, anatomy and basic ex  Clam Heel slide March  How to find and activate Transverse Abdominus.

## 2015-08-15 NOTE — Therapy (Signed)
[place], [place], [address] Phone: [phone]   Fax:  [phone]  Physical Therapy Treatment  Patient Details  Name: Jordan [name] MRN: [id number] Date of Birth: 10-12-1980 Referring Provider: Elta Guadeloupe L. Lynann Bologna, MD  Encounter Date: 08/15/2015      PT End of Session - 08/15/15 0901    Visit Number 2   Number of Visits 13   Date for PT Re-Evaluation 09/23/15   Authorization Type medicare- KX at 15   PT Start Time 0846   PT Stop Time 0943   PT Time Calculation (min) 57 min   Activity Tolerance Patient tolerated treatment well   Behavior During Therapy [facility] for tasks assessed/performed      Past Medical History  Diagnosis Date  . Basal cell carcinoma     recurrent in left maxillary, has had 5 surguries, last one 2003  . Fibroma     left ovarian  . Cyst of nasal sinus   . Candidiasis, vagina     recurrent, pt has made lifestyle modifications and none recently (as of 8/12)  . Gardnerella infection     + in 08/08  . [place]     hx of herpetic requiring I+D,( was bitten by autistic child that cares fr  . Allergic rhinitis     pt takes flonase for episodes, no episodes in 2012  . PLANTAR FASCIITIS, BILATERAL 05/24/2009  . Paronychia of third finger of left hand 07/28/2010  . Fibromyalgia   . Herniated disc   . Anxiety   . GERD (gastroesophageal reflux disease)     occ  . OVARIAN CYSTECTOMY, HX OF 03/01/2006    ovarian fibroma-left 1996, L ovary and fallopian tube removed      Past Surgical History  Procedure Laterality Date  . Left oophorectomy    . Cholecystectomy    . Nasal sinus surgery      X 2 at age 89 & 88  . Mandible reconstruction  89    upper anfd lower jaw cyst  . Lumbar laminectomy/decompression microdiscectomy N/A 08/14/2012    Procedure: LUMBAR LAMINECTOMY/DECOMPRESSION MICRODISCECTOMY Lumbar 5 -sacrum 1 decompression;  Surgeon: Sinclair Ship, MD;  Location: [facility];   Service: Orthopedics;  Laterality: N/A;  Lumbar 5 -sacrum 1 decompression    There were no vitals filed for this visit.      Subjective Assessment - 08/15/15 0854    Subjective Last session helped.  She realized she was pulling the wrong way.  No pain now, only tightness.  Has muscle spasms at night, I keep the tennis ball close to me to work the spasms out.    Currently in Pain? No/denies  tightness only Rt. side.            [facility] Adult PT Treatment/Exercise - 08/15/15 0900    Lumbar Exercises: Stretches   Active Hamstring Stretch 3 reps;30 seconds   Lower Trunk Rotation Limitations x 10 cues to increases ROM    Pelvic Tilt 5 reps   Piriformis Stretch 2 reps;30 seconds   Piriformis Stretch Limitations figure 4 supine    Lumbar Exercises: Aerobic   Stationary Bike L6 NuStep LE and UE for 6 min    Lumbar Exercises: Supine   Ab Set 10 reps   Clam 10 reps   Heel Slides 10 reps   Bent Knee Raise 5 reps   Bent Knee Raise Limitations Rt. ASIS drops with Rt. hip flexion, min pain with this, stopped  Lumbar Exercises: Sidelying   Other Sidelying Lumbar Exercises QL stretch for Rt. side    Moist Heat Therapy   Number Minutes Moist Heat 15 Minutes   Moist Heat Location Lumbar Spine  prone   Manual Therapy   Manual therapy comments well tolerated, mod to deep pressure/compression to Rt. QL    Soft tissue mobilization Rt. lumbar paraspinals, Rt, QL and attachments   Myofascial Release Rt. trunk , used mod pressure to lengthen Rt, side, caudal pressure to Rt. iliac crest                PT Education - 08/15/15 0911    Education provided Yes   Education Details stabilization, dry needling   Person(s) Educated Patient   Methods Explanation;Demonstration;Verbal cues;Handout   Comprehension Returned demonstration;Verbalized understanding          PT Short Term Goals - 08/15/15 0902    PT SHORT TERM GOAL #1   Title able to sleep through the night without being woken by  spasm by 7/11   Status On-going   PT SHORT TERM GOAL #2   Title able to perform exercises incoorporating core and extremities with good control.    Status On-going           PT Long Term Goals - 08/15/15 0902    PT LONG TERM GOAL #1   Title able to demonstrate push/pull posture using sleigh with at least 50lb to immitate work-related activities by 8/4   Status On-going   PT LONG TERM GOAL #2   Title Independent in HEP to continue improving core control following PT.    Status On-going   PT LONG TERM GOAL #3   Title resolution of spasm to decrease limitations during daily activities.    Status On-going   PT LONG TERM GOAL #4   Title FOTO improved from 49% disability to 34% disability to indicate significant functional improvement.    Status On-going               Plan - 08/15/15 1034    Clinical Impression Statement Patient able to perform basic stabilization exercises with good technique, min pain increase.  No goals met.  Reinforced good posture and rationale for activating local stabilizers with core exercises.  Less stiffness after manual and heat.    PT Next Visit Plan core stabilization, pushing/pulling/lifting posture, manual decrease to QL   PT Home Exercise Plan ab set with ball squeeze, tennis ball trigger point release.  L stab I    Consulted and Agree with Plan of Care Patient      Patient will benefit from skilled therapeutic intervention in order to improve the following deficits and impairments:  Pain, Improper body mechanics, Increased muscle spasms, Decreased strength  Visit Diagnosis: Right-sided low back pain without sciatica     Problem List Patient Active Problem List   Diagnosis Date Noted  . Bipolar disorder ([place]) 07/04/2015  . Vaginitis 05/06/2015  . Furuncle 03/18/2015  . Idiopathic hypotension 02/03/2015  . At risk for abnormal blood glucose level 12/15/2013  . Preventative health care 12/19/2011  . Fibromyalgia 06/26/2010  .  Chronic fatigue and depression 06/26/2010  . Obesity 01/17/2010    Class: Chronic  . Lumbar disc herniation of L5-S1 on the left side (MRI 2012 s/p surgery)  02/26/2008  . Allergic rhinitis 07/24/2006  . 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

## 2015-08-18 ENCOUNTER — Ambulatory Visit: Payer: Medicare Other | Admitting: Physical Therapy

## 2015-08-18 DIAGNOSIS — R293 Abnormal posture: Secondary | ICD-10-CM

## 2015-08-18 DIAGNOSIS — M25651 Stiffness of right hip, not elsewhere classified: Secondary | ICD-10-CM | POA: Diagnosis not present

## 2015-08-18 DIAGNOSIS — M545 Low back pain, unspecified: Secondary | ICD-10-CM

## 2015-08-18 DIAGNOSIS — M6281 Muscle weakness (generalized): Secondary | ICD-10-CM | POA: Diagnosis not present

## 2015-08-18 NOTE — Therapy (Signed)
[place], [place], [address] Phone: [phone]   Fax:  [phone]  Physical Therapy Treatment  Patient Details  Name: Jordan [name] MRN: [id number] Date of Birth: May 17, 1980 Referring Provider: Elta Guadeloupe L. Lynann Bologna, MD  Encounter Date: 08/18/2015      PT End of Session - 08/18/15 0934    Visit Number 3   Number of Visits 13   Date for PT Re-Evaluation 09/23/15   Authorization Type medicare- KX at 95   PT Start Time 0932   PT Stop Time 1025   PT Time Calculation (min) 53 min   Activity Tolerance Patient tolerated treatment well   Behavior During Therapy [facility] for tasks assessed/performed      Past Medical History  Diagnosis Date  . Basal cell carcinoma     recurrent in left maxillary, has had 5 surguries, last one 2003  . Fibroma     left ovarian  . Cyst of nasal sinus   . Candidiasis, vagina     recurrent, pt has made lifestyle modifications and none recently (as of 8/12)  . Gardnerella infection     + in 08/08  . [place]     hx of herpetic requiring I+D,( was bitten by autistic child that cares fr  . Allergic rhinitis     pt takes flonase for episodes, no episodes in 2012  . PLANTAR FASCIITIS, BILATERAL 05/24/2009  . Paronychia of third finger of left hand 07/28/2010  . Fibromyalgia   . Herniated disc   . Anxiety   . GERD (gastroesophageal reflux disease)     occ  . OVARIAN CYSTECTOMY, HX OF 03/01/2006    ovarian fibroma-left 1996, L ovary and fallopian tube removed      Past Surgical History  Procedure Laterality Date  . Left oophorectomy    . Cholecystectomy    . Nasal sinus surgery      X 2 at age 35 & 62  . Mandible reconstruction  92    upper anfd lower jaw cyst  . Lumbar laminectomy/decompression microdiscectomy N/A 08/14/2012    Procedure: LUMBAR LAMINECTOMY/DECOMPRESSION MICRODISCECTOMY Lumbar 5 -sacrum 1 decompression;  Surgeon: Sinclair Ship, MD;  Location: [facility];   Service: Orthopedics;  Laterality: N/A;  Lumbar 5 -sacrum 1 decompression    There were no vitals filed for this visit.      Subjective Assessment - 08/18/15 0936    Subjective Not much pain,  just in 2/3 10 range. Doing a turn, sometimes spasm either to the left or the right.   Limitations Other (comment)   Patient Stated Goals sleep, push/pull for work   Currently in Pain? Yes   Pain Score 3    Pain Location Back   Pain Orientation Right   Pain Descriptors / Indicators Sore;Spasm   Pain Type Acute pain   Pain Onset More than a month ago   Pain Frequency Intermittent            [facility] PT Assessment - 08/18/15 0948    Posture/Postural Control   Posture/Postural Control Postural limitations   Posture Comments Right QL elevated,                      [facility] Adult PT Treatment/Exercise - 08/18/15 0948    Lumbar Exercises: Stretches   Active Hamstring Stretch 3 reps;30 seconds   Lower Trunk Rotation Limitations x 10 cues to increases ROM   Pain with right rotation but with  more motion   Pelvic Tilt 5 reps   Piriformis Stretch 2 reps;30 seconds   Lumbar Exercises: Supine   Ab Set 10 reps   Clam 10 reps   Clam Limitations Pain felt more with R QL   Heel Slides 10 reps   Bent Knee Raise 5 reps   Bent Knee Raise Limitations feels more pain with these motions in R QL   Lumbar Exercises: Sidelying   Other Sidelying Lumbar Exercises QL stretch for Rt. side   Pt given handout Left sidelying QL stretch 13 min   Moist Heat Therapy   Moist Heat Location Lumbar Spine  prone   Manual Therapy   Manual therapy comments let sidelying with mod to deep pressure   Soft tissue mobilization Rt. lumbar paraspinals, Rt, QL and attachments   Myofascial Release Rt. trunk , used mod pressure to lengthen Rt, side, caudal pressure to Rt. iliac crest          Trigger Point Dry Needling - 08/18/15 0946    Consent Given? Yes   Education Handout Provided Yes   Muscles Treated  Upper Body Quadratus Lumborum  right side only TDN  twitch for QL   Muscles Treated Lower Body Gluteus minimus;Gluteus maximus   Gluteus Maximus Response Twitch response elicited;Palpable increased muscle length   Gluteus Minimus Response Twitch response elicited;Palpable increased muscle length              PT Education - 08/18/15 0943    Education provided Yes   Education Details Quadratus Lumborum stretch and information of Dry needling precautions and aftercare   Person(s) Educated Patient   Methods Explanation;Demonstration;Verbal cues;Handout   Comprehension Verbalized understanding;Returned demonstration          PT Short Term Goals - 08/18/15 1434    PT SHORT TERM GOAL #1   Title able to sleep through the night without being woken by spasm by 7/11   Time 3   Period Weeks   Status On-going   PT SHORT TERM GOAL #2   Title able to perform exercises incoorporating core and extremities with good control.    Time 3   Period Weeks   Status On-going   PT SHORT TERM GOAL #3   Title walk 15 min for exercise at home   Time 3   Period Weeks   Status Achieved           PT Long Term Goals - 08/15/15 [id number]    PT LONG TERM GOAL #1   Title able to demonstrate push/pull posture using sleigh with at least 50lb to immitate work-related activities by 8/4   Status On-going   PT LONG TERM GOAL #2   Title Independent in HEP to continue improving core control following PT.    Status On-going   PT LONG TERM GOAL #3   Title resolution of spasm to decrease limitations during daily activities.    Status On-going   PT LONG TERM GOAL #4   Title FOTO improved from 49% disability to 34% disability to indicate significant functional improvement.    Status On-going               Plan - 08/18/15 [id number]    Clinical Impression Statement Pt consented to dry needling for trigger point pain on right Quadratus and gluteals.  Pt was monitored throughout session.  Pt with decrease in  spasm post needling.  Will continue to asses for benefit of trigger point dry needling and her ability to  perform exericises/Core strengthening more compfortabley    Rehab Potential Good   PT Frequency 2x / week   PT Duration 6 weeks   PT Treatment/Interventions ADLs/Self Care Home Management;Electrical Stimulation;Cryotherapy;Ultrasound;Moist Heat;Iontophoresis 4mg /ml Dexamethasone;Gait training;Stair training;Functional mobility training;Therapeutic activities;Therapeutic exercise;Balance training;Patient/family education;Neuromuscular re-education;Manual techniques;Taping;Dry needling;Passive range of motion   PT Next Visit Plan assess dry needling, review Quadratus lumborum stretch if necessary . progress with core/ pilates strengthening as needed   PT Home Exercise Plan continue HEP and added Quadratus lumborum stretch   Consulted and Agree with Plan of Care Patient      Patient will benefit from skilled therapeutic intervention in order to improve the following deficits and impairments:  Pain, Improper body mechanics, Increased muscle spasms, Decreased strength  Visit Diagnosis: Right-sided low back pain without sciatica  Bilateral low back pain, with sciatica presence unspecified  Abnormal posture  Stiffness of joint of right pelvic region and thigh  Weakness of trunk musculature     Problem List Patient Active Problem List   Diagnosis Date Noted  . Bipolar disorder ([place]) 07/04/2015  . Vaginitis 05/06/2015  . Furuncle 03/18/2015  . Idiopathic hypotension 02/03/2015  . At risk for abnormal blood glucose level 12/15/2013  . Preventative health care 12/19/2011  . Fibromyalgia 06/26/2010  . Chronic fatigue and depression 06/26/2010  . Obesity 01/17/2010    Class: Chronic  . Lumbar disc herniation of L5-S1 on the left side (MRI 2012 s/p surgery)  02/26/2008  . Allergic rhinitis 07/24/2006  . 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

## 2015-08-18 NOTE — Patient Instructions (Signed)
Trigger Point Dry Needling  . What is Trigger Point Dry Needling (DN)? o DN is a physical therapy technique used to treat muscle pain and dysfunction. Specifically, DN helps deactivate muscle trigger points (muscle knots).  o A thin filiform needle is used to penetrate the skin and stimulate the underlying trigger point. The goal is for a local twitch response (LTR) to occur and for the trigger point to relax. No medication of any kind is injected during the procedure.   . What Does Trigger Point Dry Needling Feel Like?  o The procedure feels different for each individual patient. Some patients report that they do not actually feel the needle enter the skin and overall the process is not painful. Very mild bleeding may occur. However, many patients feel a deep cramping in the muscle in which the needle was inserted. This is the local twitch response.   Marland Kitchen How Will I feel after the treatment? o Soreness is normal, and the onset of soreness may not occur for a few hours. Typically this soreness does not last longer than two days.  o Bruising is uncommon, however; ice can be used to decrease any possible bruising.  o In rare cases feeling tired or nauseous after the treatment is normal. In addition, your symptoms may get worse before they get better, this period will typically not last longer than 24 hours.   . What Can I do After My Treatment? o Increase your hydration by drinking more water for the next 24 hours. o You may place ice or heat on the areas treated that have become sore, however, do not use heat on inflamed or bruised areas. Heat often brings more relief post needling. o You can continue your regular activities, but vigorous activity is not recommended initially after the treatment for 24 hours. o DN is best combined with other physical therapy such as strengthening, stretching, and other therapies.   Given handout for Quadratus Lumborum stretch in left sidelying for 3-5  minutes as  needed for spasm.   Jordan [name], PT 08/18/2015 9:43 AM Phone: [phone] Fax: [phone]

## 2015-08-22 ENCOUNTER — Ambulatory Visit: Payer: Medicare Other | Attending: Internal Medicine | Admitting: Physical Therapy

## 2015-08-22 DIAGNOSIS — M6281 Muscle weakness (generalized): Secondary | ICD-10-CM | POA: Insufficient documentation

## 2015-08-22 DIAGNOSIS — M545 Low back pain, unspecified: Secondary | ICD-10-CM

## 2015-08-22 DIAGNOSIS — R293 Abnormal posture: Secondary | ICD-10-CM | POA: Diagnosis not present

## 2015-08-22 DIAGNOSIS — M25651 Stiffness of right hip, not elsewhere classified: Secondary | ICD-10-CM | POA: Diagnosis not present

## 2015-08-22 NOTE — Therapy (Signed)
[facility] [address] [place], [place], [address] Phone: [phone]   Fax:  [phone]  Physical Therapy Treatment  Patient Details  Name: Jordan [name] MRN: [id number] Date of Birth: 1980-06-12 Referring Provider: Loraine Leriche L. Yevette Edwards, MD  Encounter Date: 08/22/2015      PT End of Session - 08/22/15 1516    Visit Number 4   Number of Visits 13   Date for PT Re-Evaluation 09/23/15   PT Start Time 1500   PT Stop Time 1545   PT Time Calculation (min) 45 min   Activity Tolerance Patient tolerated treatment well   Behavior During Therapy Jordan [name] for tasks assessed/performed      Past Medical History  Diagnosis Date  . Basal cell carcinoma     recurrent in left maxillary, has had 5 surguries, last one 2003  . Fibroma     left ovarian  . Cyst of nasal sinus   . Candidiasis, vagina     recurrent, pt has made lifestyle modifications and none recently (as of 8/12)  . Gardnerella infection     + in 08/08  . [place]low     hx of herpetic requiring I+D,( was bitten by autistic child that cares fr  . Allergic rhinitis     pt takes flonase for episodes, no episodes in 2012  . PLANTAR FASCIITIS, BILATERAL 05/24/2009  . Paronychia of third finger of left hand 07/28/2010  . Fibromyalgia   . Herniated disc   . Anxiety   . GERD (gastroesophageal reflux disease)     occ  . OVARIAN CYSTECTOMY, HX OF 03/01/2006    ovarian fibroma-left 1996, L ovary and fallopian tube removed      Past Surgical History  Procedure Laterality Date  . Left oophorectomy    . Cholecystectomy    . Nasal sinus surgery      X 2 at age 68 & 35  . Mandible reconstruction  92    upper anfd lower jaw cyst  . Lumbar laminectomy/decompression microdiscectomy N/A 08/14/2012    Procedure: LUMBAR LAMINECTOMY/DECOMPRESSION MICRODISCECTOMY Lumbar 5 -sacrum 1 decompression;  Surgeon: Jordan Hero, MD;  Location: [facility] OR;  Service: Orthopedics;  Laterality: N/A;   Lumbar 5 -sacrum 1 decompression    There were no vitals filed for this visit.      Subjective Assessment - 08/22/15 1502    Subjective This morning tight in the front of Rt. hip, abdominals rated 5/10. None current.    Currently in Pain? No/denies             [name] Adult PT Treatment/Exercise - 08/22/15 1510    Self-Care   Self-Care Other Self-Care Comments   Other Self-Care Comments  tennis ball in standing for Rt QL and also strretching L side as well, standing QL stretch in doorway    Lumbar Exercises: Stretches   Active Hamstring Stretch 2 reps;30 seconds   Single Knee to Chest Stretch 2 reps;30 seconds   Lumbar Exercises: Supine   Clam 10 reps   Clam Limitations on foam    Heel Slides 10 reps   Heel Slides Limitations on foam    Bent Knee Raise 10 reps   Bent Knee Raise Limitations on foam    Other Supine Lumbar Exercises pt reports pain with clams at home on the floor   Other Supine Lumbar Exercises Pilates Reformer see note         Pilates Reformer used for LE/core strength, postural strength, lumbopelvic  disassociation and core control.  Exercises included:  Bridging 2 Red 1 Blue with ball between thighs to engage.  X 8, added a lateral translation to work QL   Added to HEP  Supine Arm work 1 Red 1 yellow Arcs circles x 10 each , pt needs extended rest breaks          PT Education - 08/22/15 1516    Education provided Yes   Education Details core and Pilates   Person(s) Educated Patient   Methods Explanation   Comprehension Verbalized understanding          PT Short Term Goals - 08/18/15 1434    PT SHORT TERM GOAL #1   Title able to sleep through the night without being woken by spasm by 7/11   Time 3   Period Weeks   Status On-going   PT SHORT TERM GOAL #2   Title able to perform exercises incoorporating core and extremities with good control.    Time 3   Period Weeks   Status On-going   PT SHORT TERM GOAL #3   Title walk 15 min for  exercise at home   Time 3   Period Weeks   Status Achieved           PT Long Term Goals - 08/15/15 1610    PT LONG TERM GOAL #1   Title able to demonstrate push/pull posture using sleigh with at least 50lb to immitate work-related activities by 8/4   Status On-going   PT LONG TERM GOAL #2   Title Independent in HEP to continue improving core control following PT.    Status On-going   PT LONG TERM GOAL #3   Title resolution of spasm to decrease limitations during daily activities.    Status On-going   PT LONG TERM GOAL #4   Title FOTO improved from 49% disability to 34% disability to indicate significant functional improvement.    Status On-going               Plan - 08/22/15 1548    Clinical Impression Statement Jordan [name] did well today, needs encouragement to complete exercises.  She was shown how to stretch and use ball in standing for trigger point release at work.  No pain post.  Reports abs hurting after Pilates Reformer basic ex.    PT Next Visit Plan cont to progress core, Pilates and manual if needed    PT Home Exercise Plan continue HEP and added Quadratus lumborum stretch added bridging   Consulted and Agree with Plan of Care Patient      Patient will benefit from skilled therapeutic intervention in order to improve the following deficits and impairments:  Pain, Improper body mechanics, Increased muscle spasms, Decreased strength  Visit Diagnosis: Right-sided low back pain without sciatica  Bilateral low back pain, with sciatica presence unspecified  Abnormal posture  Stiffness of joint of right pelvic region and thigh     Problem List Patient Active Problem List   Diagnosis Date Noted  . Bipolar disorder (HCC) 07/04/2015  . Vaginitis 05/06/2015  . Furuncle 03/18/2015  . Idiopathic hypotension 02/03/2015  . At risk for abnormal blood glucose level 12/15/2013  . Preventative health care 12/19/2011  . Fibromyalgia 06/26/2010  . Chronic fatigue and  depression 06/26/2010  . Obesity 01/17/2010    Class: Chronic  . Lumbar disc herniation of L5-S1 on the left side (MRI 2012 s/p surgery)  02/26/2008  . Allergic rhinitis 07/24/2006  . Nevoid  basal cell carcinoma syndrome 03/01/2006    Renwick Asman 08/22/2015, 3:50 PM  [name] [address] [place], [place], [address] Phone: [phone]   Fax:  [phone]  Name: Jordan [name] MRN: [id number] Date of Birth: Feb 24, 1980    Karie Mainland, PT 08/22/2015 3:50 PM Phone: [phone] Fax: [phone]

## 2015-08-22 NOTE — Patient Instructions (Signed)
Extension: Bridging - Supine    Lie with hips and knees bent, feet flat. Lift hips off surface. Press back down into the table (Pelvic tilt)  or floor to roll up one bone at a time.   Repeat ___10_ times per set. Do ___2_ sets per session. Do _5___ sessions per week.  Copyright  VHI. All rights reserved.  Try your TENS unit Use tennis ball on the wall Standing stretch for Rt. Low back   Cross Rt. Leg back, reach arms to L side of upper doorway  Lean to Rt. And hold 30 sec.

## 2015-08-24 ENCOUNTER — Ambulatory Visit: Payer: Medicare Other | Admitting: Physical Therapy

## 2015-08-24 ENCOUNTER — Encounter: Payer: Self-pay | Admitting: Physical Therapy

## 2015-08-24 DIAGNOSIS — R293 Abnormal posture: Secondary | ICD-10-CM | POA: Diagnosis not present

## 2015-08-24 DIAGNOSIS — M545 Low back pain, unspecified: Secondary | ICD-10-CM

## 2015-08-24 DIAGNOSIS — M6281 Muscle weakness (generalized): Secondary | ICD-10-CM | POA: Diagnosis not present

## 2015-08-24 DIAGNOSIS — M25651 Stiffness of right hip, not elsewhere classified: Secondary | ICD-10-CM | POA: Diagnosis not present

## 2015-08-24 NOTE — Therapy (Signed)
Class: Chronic  . Lumbar disc herniation of L5-S1 on the left side (MRI 2012 s/p surgery)  02/26/2008  . Allergic rhinitis 07/24/2006  . 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   Tereasa Yilmaz C. Catharina Pica PT, DPT 08/24/2015 5:19 PM   [facility] [address] [place], [place], [address] Phone: [phone]   Fax:  [phone]  Name: Jordan [name] MRN: [id number] Date of Birth: 02-Jun-1980  [place], [place], [address] Phone: [phone]   Fax:  [phone]  Physical Therapy Treatment  Patient Details  Name: Jordan [name] MRN: [id number] Date of Birth: 1980/08/21 Referring Provider: Elta Guadeloupe L. Lynann Bologna, MD  Encounter Date: 08/24/2015      PT End of Session - 08/24/15 1639    Visit Number 5   Number of Visits 13   Date for PT Re-Evaluation 09/23/15   Authorization Type medicare- KX at 15   PT Start Time 1633   PT Stop Time 1714   PT Time Calculation (min) 41 min   Activity Tolerance Patient limited by fatigue   Behavior During Therapy [facility] for tasks assessed/performed      Past Medical History  Diagnosis Date  . Basal cell carcinoma     recurrent in left maxillary, has had 5 surguries, last one 2003  . Fibroma     left ovarian  . Cyst of nasal sinus   . Candidiasis, vagina     recurrent, pt has made lifestyle modifications and none recently (as of 8/12)  . Gardnerella infection     + in 08/08  . [place]     hx of herpetic requiring I+D,( was bitten by autistic child that cares fr  . Allergic rhinitis     pt takes flonase for episodes, no episodes in 2012  . PLANTAR FASCIITIS, BILATERAL 05/24/2009  . Paronychia of third finger of left hand 07/28/2010  . Fibromyalgia   . Herniated disc   . Anxiety   . GERD (gastroesophageal reflux disease)     occ  . OVARIAN CYSTECTOMY, HX OF 03/01/2006    ovarian fibroma-left 1996, L ovary and fallopian tube removed      Past Surgical History  Procedure Laterality Date  . Left oophorectomy    . Cholecystectomy    . Nasal sinus surgery      X 2 at age 51 & 6  . Mandible reconstruction  92    upper anfd lower jaw cyst  . Lumbar laminectomy/decompression microdiscectomy N/A 08/14/2012    Procedure: LUMBAR LAMINECTOMY/DECOMPRESSION MICRODISCECTOMY Lumbar 5 -sacrum 1 decompression;  Surgeon: Sinclair Ship, MD;  Location: [place];  Service:  Orthopedics;  Laterality: N/A;  Lumbar 5 -sacrum 1 decompression    There were no vitals filed for this visit.      Subjective Assessment - 08/24/15 1634    Subjective Pain in R low back after standing and moving around a lot yesterday which resulted in spasm when rolling on to L side in bed. Very sore today, has been stretching on and off all day. 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 Yes   Pain Score 4    Pain Location Back   Pain Orientation Right   Pain Descriptors / Indicators Sore   Aggravating Factors  rolling over                         [facility] Adult PT Treatment/Exercise - 08/24/15 0001    Exercises   Exercises Other Exercises   Other Exercises  reformer: 2 red 1 blue bridges with ball bw knees x8, single leg press with knee to 90x8ea; 1red 1 yellow arm work 10 ea, foot work x10 legs lowering   Lumbar Exercises: Stretches   Single Knee to Chest Stretch 2 reps;30 seconds   Standing Extension Limitations supine extension  Class: Chronic  . Lumbar disc herniation of L5-S1 on the left side (MRI 2012 s/p surgery)  02/26/2008  . Allergic rhinitis 07/24/2006  . 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   Tereasa Yilmaz C. Catharina Pica PT, DPT 08/24/2015 5:19 PM   [facility] [address] [place], [place], [address] Phone: [phone]   Fax:  [phone]  Name: Jordan [name] MRN: [id number] Date of Birth: 02-Jun-1980

## 2015-08-29 ENCOUNTER — Encounter (HOSPITAL_COMMUNITY): Payer: Self-pay | Admitting: Clinical

## 2015-08-29 ENCOUNTER — Ambulatory Visit (INDEPENDENT_AMBULATORY_CARE_PROVIDER_SITE_OTHER): Payer: Medicare Other | Admitting: Clinical

## 2015-08-29 DIAGNOSIS — F3162 Bipolar disorder, current episode mixed, moderate: Secondary | ICD-10-CM | POA: Diagnosis not present

## 2015-08-29 NOTE — Progress Notes (Signed)
   THERAPIST PROGRESS NOTE  Session Time: 4:28 - 5:30  Participation Level: Active  Behavioral Response: CasualAlertAnxious  Type of Therapy: Individual Therapy  Treatment Goals addressed: improve psychiatric symptoms, controlled  mood (improved social functioning), improve unhelpful thought patterns, elevate mood ( social interactions), and interpersonal relationship skills  Interventions: Motivational Interviewing, CBT, Grounding and Mindfulness Techniques, psychoeducation  Summary: Jordan [name] is a 35 y.o. female who presents with Moderate mixed bipolar I disorder.   Suicidal/Homicidal: No -without intent/plan  Therapist Response:  [name] met with clinician for an individual session. [name] discussed her psychiatric symptoms, her current life events and her homework. [name] shared that she completed her homework packet on bipolar. Client and clinician reviewed and discussed her answers thoughts and insights from the homework. Clinician gave her packet 6 to take home for homework. [name] shared that her niece (who she raised and lives with her ) had surgery. [name] shared that she was experiencing some difficulty with her niece which affect her symptoms. Clinician asked open ended questions. [name] identified her negative automatic thoughts. Client and clinician discussed the evidence for and against her negative thoughts. [name] then had an insight that helpped her to formulate healthier alternative thoughts. She was relieved to have the alternative thoughts and said that it would help her to feel less angry and depressed. [name] shared that overall she has had less anxiety because she has been practicing her grounding and mindfulness techniques.  Plan: Return again in 1 week.  Diagnosis: Axis I: Moderate mixed bipolar I disorder.   [name] A, LCSW 08/29/2015

## 2015-08-31 ENCOUNTER — Encounter: Payer: Self-pay | Admitting: Physical Therapy

## 2015-08-31 ENCOUNTER — Ambulatory Visit: Payer: Medicare Other | Admitting: Physical Therapy

## 2015-08-31 DIAGNOSIS — M545 Low back pain, unspecified: Secondary | ICD-10-CM

## 2015-08-31 DIAGNOSIS — R293 Abnormal posture: Secondary | ICD-10-CM

## 2015-08-31 DIAGNOSIS — M25651 Stiffness of right hip, not elsewhere classified: Secondary | ICD-10-CM | POA: Diagnosis not present

## 2015-08-31 DIAGNOSIS — M6281 Muscle weakness (generalized): Secondary | ICD-10-CM | POA: Diagnosis not present

## 2015-08-31 NOTE — Therapy (Signed)
[facility] Outpatient Rehabilitation [facility] [address] [place], [place], [address] Phone: [phone]   Fax:  [phone]  Physical Therapy Treatment  Patient Details  Name: Jordan [name] MRN: [id number] Date of Birth: 05-Mar-1980 Referring Provider: Loraine Leriche L. Yevette Edwards, MD  Encounter Date: 08/31/2015      PT End of Session - 08/31/15 0931    Visit Number 6   Number of Visits 13   Date for PT Re-Evaluation 09/23/15   Authorization Type medicare- KX at 15   PT Start Time 0935   PT Stop Time 1019   PT Time Calculation (min) 44 min   Activity Tolerance Patient limited by fatigue   Behavior During Therapy [facility] for tasks assessed/performed      Past Medical History  Diagnosis Date  . Basal cell carcinoma     recurrent in left maxillary, has had 5 surguries, last one 2003  . Fibroma     left ovarian  . Cyst of nasal sinus   . Candidiasis, vagina     recurrent, pt has made lifestyle modifications and none recently (as of 8/12)  . Gardnerella infection     + in 08/08  . [place]     hx of herpetic requiring I+D,( was bitten by autistic child that cares fr  . Allergic rhinitis     pt takes flonase for episodes, no episodes in 2012  . PLANTAR FASCIITIS, BILATERAL 05/24/2009  . Paronychia of third finger of left hand 07/28/2010  . Fibromyalgia   . Herniated disc   . Anxiety   . GERD (gastroesophageal reflux disease)     occ  . OVARIAN CYSTECTOMY, HX OF 03/01/2006    ovarian fibroma-left 1996, L ovary and fallopian tube removed      Past Surgical History  Procedure Laterality Date  . Left oophorectomy    . Cholecystectomy    . Nasal sinus surgery      X 2 at age 53 & 80  . Mandible reconstruction  92    upper anfd lower jaw cyst  . Lumbar laminectomy/decompression microdiscectomy N/A 08/14/2012    Procedure: LUMBAR LAMINECTOMY/DECOMPRESSION MICRODISCECTOMY Lumbar 5 -sacrum 1 decompression;  Surgeon: Emilee Hero, MD;  Location: [facility] OR;  Service:  Orthopedics;  Laterality: N/A;  Lumbar 5 -sacrum 1 decompression    There were no vitals filed for this visit.      Subjective Assessment - 08/31/15 0936    Subjective Pt reports she is feeling pretty good today. Spasm in back when rolling over in bed.    Currently in Pain? No/denies                         [facility] Adult PT Treatment/Exercise - 08/31/15 0001    Lumbar Exercises: Aerobic   Elliptical 5 min L1 incline 10   Lumbar Exercises: Standing   Functional Squats Limitations single leg cross squat 5lb kettle bell   Side Lunge 10 reps  x10 ea; 5lb kettle bell UE ext   Row 20 reps  bilateral, alt UE   Theraband Level (Row) Level 4 (Blue)   Row Limitations standing in squat   Other Standing Lumbar Exercises mini squat in PF punches alt UE blue tband x20 ea   Lumbar Exercises: Supine   Clam 20 reps   Clam Limitations green tband on foam roll   Bent Knee Raise 10 reps   Bent Knee Raise Limitations on foam roll, each leg. holding ball at 90  deg GHJ flx   Bridge 10 reps;5 seconds   Bridge Limitations in DF and arms crossed across chest   Other Supine Lumbar Exercises on foam roll 5lb kettle bell diagonals x15 ea   Lumbar Exercises: Quadruped   Opposite Arm/Leg Raise Right arm/Left leg;Left arm/Right leg;10 reps;3 seconds                PT Education - 08/31/15 0937    Education provided Yes   Education Details exercise form/rationale   Person(s) Educated Patient   Methods Explanation;Demonstration;Tactile cues;Verbal cues   Comprehension Verbalized understanding;Returned demonstration;Verbal cues required;Tactile cues required;Need further instruction          PT Short Term Goals - 08/18/15 1434    PT SHORT TERM GOAL #1   Title able to sleep through the night without being woken by spasm by 7/11   Time 3   Period Weeks   Status On-going   PT SHORT TERM GOAL #2   Title able to perform exercises incoorporating core and extremities with good  control.    Time 3   Period Weeks   Status On-going   PT SHORT TERM GOAL #3   Title walk 15 min for exercise at home   Time 3   Period Weeks   Status Achieved           PT Long Term Goals - 08/15/15 0102    PT LONG TERM GOAL #1   Title able to demonstrate push/pull posture using sleigh with at least 50lb to immitate work-related activities by 8/4   Status On-going   PT LONG TERM GOAL #2   Title Independent in HEP to continue improving core control following PT.    Status On-going   PT LONG TERM GOAL #3   Title resolution of spasm to decrease limitations during daily activities.    Status On-going   PT LONG TERM GOAL #4   Title FOTO improved from 49% disability to 34% disability to indicate significant functional improvement.    Status On-going               Plan - 08/31/15 0957    Clinical Impression Statement Fatigue with exercises today requiring frequent cuing to continue and took multiple rest breaks. Moved to standing to incorporate CKC hip and core activation.   PT Next Visit Plan cont to progress core, Pilates and manual if needed, challenge pushing and pulling   PT Home Exercise Plan continue HEP and added Quadratus lumborum stretch added bridging, child pose   Consulted and Agree with Plan of Care Patient      Patient will benefit from skilled therapeutic intervention in order to improve the following deficits and impairments:     Visit Diagnosis: Right-sided low back pain without sciatica  Bilateral low back pain, with sciatica presence unspecified  Abnormal posture     Problem List Patient Active Problem List   Diagnosis Date Noted  . Bipolar disorder (HCC) 07/04/2015  . Vaginitis 05/06/2015  . Furuncle 03/18/2015  . Idiopathic hypotension 02/03/2015  . At risk for abnormal blood glucose level 12/15/2013  . Preventative health care 12/19/2011  . Fibromyalgia 06/26/2010  . Chronic fatigue and depression 06/26/2010  . Obesity 01/17/2010     Class: Chronic  . Lumbar disc herniation of L5-S1 on the left side (MRI 2012 s/p surgery)  02/26/2008  . Allergic rhinitis 07/24/2006  . 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    Zylpha Poynor C. Harless Molinari PT, DPT 08/31/2015 10:31 AM  [facility] Outpatient Rehabilitation [facility] [address] [place], [place], [address] Phone: [phone]   Fax:  [phone]  Name: Jordan [name] MRN: [id number] Date of Birth: [birth year]

## 2015-09-01 ENCOUNTER — Ambulatory Visit: Payer: Medicare Other | Admitting: Physical Therapy

## 2015-09-01 DIAGNOSIS — M545 Low back pain, unspecified: Secondary | ICD-10-CM

## 2015-09-01 DIAGNOSIS — R293 Abnormal posture: Secondary | ICD-10-CM | POA: Diagnosis not present

## 2015-09-01 DIAGNOSIS — M25651 Stiffness of right hip, not elsewhere classified: Secondary | ICD-10-CM

## 2015-09-01 DIAGNOSIS — M6281 Muscle weakness (generalized): Secondary | ICD-10-CM | POA: Diagnosis not present

## 2015-09-01 NOTE — Patient Instructions (Signed)
Flexors, Supine Bridge   Lie supine, feet shoulder-width apart. Lift hips toward ceiling. Hold _30__ seconds.  [email or web address].[email or web address]   HIP: Flexors - Supine   Lie on edge of surface. Place leg off the surface, allow knee to bend. Bring other knee toward chest. Hold _30__ seconds. 2-3___ reps per set, 1___ sets per day, _7__ days per week Rest lowered foot on stool.  BACK: Hip Flexor Stretch  Quads / HF, Standing   Stand, holding onto chair and grasping one foot with other-side hand. Pull heel toward buttock until stretch is felt in front of thigh. Hold _30__ seconds.  Repeat _2-3__ times per session. Do _1__ sessions per day. Keep your knee cap toward the ground.  Copyright  VHI. All rights reserved.  Have a great day Krystalle!  You are awesome!!  Voncille Lo, PT 09/01/2015 9:59 AM Phone: [phone] Fax: [phone]

## 2015-09-01 NOTE — Therapy (Signed)
[place], [place], [address] Phone: [phone]   Fax:  [phone]  Physical Therapy Treatment/ Discharge Note  Patient Details  Name: Jordan [name] MRN: [id number] Date of Birth: 11-Aug-1980 Referring Provider: Elta Guadeloupe L. Lynann Bologna, MD  Encounter Date: 09/01/2015      PT End of Session - 09/01/15 1000    Visit Number 7   Number of Visits 13   Date for PT Re-Evaluation 09/23/15   Authorization Type medicare- KX at 15   PT Start Time 0935   PT Stop Time 1010   PT Time Calculation (min) 35 min   Activity Tolerance Patient tolerated treatment well   Behavior During Therapy [facility] for tasks assessed/performed      Past Medical History  Diagnosis Date  . Basal cell carcinoma     recurrent in left maxillary, has had 5 surguries, last one 2003  . Fibroma     left ovarian  . Cyst of nasal sinus   . Candidiasis, vagina     recurrent, pt has made lifestyle modifications and none recently (as of 8/12)  . Gardnerella infection     + in 08/08  . Whitlow     hx of herpetic requiring I+D,( was bitten by autistic child that cares fr  . Allergic rhinitis     pt takes flonase for episodes, no episodes in 2012  . PLANTAR FASCIITIS, BILATERAL 05/24/2009  . Paronychia of third finger of left hand 07/28/2010  . Fibromyalgia   . Herniated disc   . Anxiety   . GERD (gastroesophageal reflux disease)     occ  . OVARIAN CYSTECTOMY, HX OF 03/01/2006    ovarian fibroma-left 1996, L ovary and fallopian tube removed      Past Surgical History  Procedure Laterality Date  . Left oophorectomy    . Cholecystectomy    . Nasal sinus surgery      X 2 at age 54 & 62  . Mandible reconstruction  89    upper anfd lower jaw cyst  . Lumbar laminectomy/decompression microdiscectomy N/A 08/14/2012    Procedure: LUMBAR LAMINECTOMY/DECOMPRESSION MICRODISCECTOMY Lumbar 5 -sacrum 1 decompression;  Surgeon: Sinclair Ship, MD;   Location: [place];  Service: Orthopedics;  Laterality: N/A;  Lumbar 5 -sacrum 1 decompression    There were no vitals filed for this visit.      Subjective Assessment - 09/01/15 0941    Subjective Pt reports she is feeling well and ready to return to working out    Limitations Other (comment)  difficultly sleeping   Patient Stated Goals sleep, push/pull for work   Currently in Pain? No/denies            [facility] PT Assessment - 09/01/15 0947    Observation/Other Assessments   Focus on Therapeutic Outcomes (FOTO)  FOTO intake 82, predicted 34%  limitation 18%   AROM   Lumbar Flexion 80  no pain   Lumbar Extension 20  no pain   Strength   Right Hip Flexion 4+/5   Right Hip Extension 4+/5   Right Hip ABduction 4-/5   Right Hip ADduction 4-/5   Flexibility   Soft Tissue Assessment /Muscle Length yes   ITB tight bil  + ELys  tight hip flexors bil but no pain on end range                     [facility] Adult PT Treatment/Exercise - 09/01/15 0945  Lumbar Exercises: Stretches   ITB Stretch 30 seconds;2 reps  bil   Lumbar Exercises: Aerobic   Elliptical 6 min L1 incline 10   Lumbar Exercises: Standing   Functional Squats Limitations single leg cross squat 5lb kettle bell10 x   Side Lunge 10 reps  x10 ea; 5lb kettle bell UE ext   Row --   Theraband Level (Row) --   Other Standing Lumbar Exercises mini squat in PF punches alt UE blue tband x20 ea   Lumbar Exercises: Supine   Clam 20 reps   Clam Limitations green tband on foam roll   Bent Knee Raise --   Bent Knee Raise Limitations --   Bridge --   Bridge Limitations --   Other Supine Lumbar Exercises on foam roll 5lb kettle bell diagonals x15 ea   Lumbar Exercises: Quadruped   Opposite Arm/Leg Raise Right arm/Left leg;Left arm/Right leg;10 reps;3 seconds   Knee/Hip Exercises: Stretches   Hip Flexor Stretch 2 reps;30 seconds   Hip Flexor Stretch Limitations in bridging position and with right leg off mat and  opposite to chest   Other Knee/Hip Stretches standing quad stretch  30 sec x 2 bil with VC for patella pointed to floor                PT Education - 09/01/15 1000    Education provided Yes   Education Details reviewed exericise for HEP, DC instructions   Person(s) Educated Patient;Other (comment)  friend   Methods Explanation;Demonstration;Tactile cues;Verbal cues;Handout   Comprehension Verbalized understanding;Returned demonstration          PT Short Term Goals - 09/01/15 1013    PT SHORT TERM GOAL #1   Title able to sleep through the night without being woken by spasm by 7/11   Baseline able to use heat and tennis ball to control when necessay   Time 3   Period Weeks   Status Achieved   PT SHORT TERM GOAL #2   Title able to perform exercises incoorporating core and extremities with good control.    Time 3   Period Weeks   Status Achieved   PT SHORT TERM GOAL #3   Title walk 15 min for exercise at home   Baseline walking unlimited   Time 3   Period Weeks   Status Achieved           PT Long Term Goals - 09/01/15 1013    PT LONG TERM GOAL #1   Title able to demonstrate push/pull posture using sleigh with at least 50lb to immitate work-related activities by 8/4   Baseline Pt able to push and pull items and use kettle ball in exericises without pain   Time 6   Period Weeks   Status Achieved   PT LONG TERM GOAL #2   Title Independent in HEP to continue improving core control following PT.    Time 6   Period Weeks   Status Achieved   PT LONG TERM GOAL #3   Title resolution of spasm to decrease limitations during daily activities.    Baseline only spasm occassionally in turning at night but able to manage on her own   Time 6   Period Weeks   Status Achieved   PT LONG TERM GOAL #4   Title FOTO improved from 49% disability to 34% disability to indicate significant functional improvement.    Baseline improved to 18% disability/limitation   Time 6    Period Weeks  Status Achieved   PT LONG TERM GOAL #5   Title She will report able to lye on stomach without pain   Baseline pt able to exericise in prone without difficulty   Time 6   Period Weeks   Status Achieved               Plan - 2015-09-13 1015    Clinical Impression Statement Pt reports no pain in back but soreness in abdomen from core exericses . Pt has completed all goals and is currently doing exericises at [place].  Pt reports no pain today and is ready for DC with increased Lumbar AROM and strength improved since beginning.  PT with FOTO limitation improved from evaluation  to 18 % limtation   Goal was 34%   Rehab Potential Good   PT Frequency 2x / week   PT Duration 6 weeks   PT Treatment/Interventions ADLs/Self Care Home Management;Electrical Stimulation;Cryotherapy;Ultrasound;Moist Heat;Iontophoresis 85m/ml Dexamethasone;Gait training;Stair training;Functional mobility training;Therapeutic activities;Therapeutic exercise;Balance training;Patient/family education;Neuromuscular re-education;Manual techniques;Taping;Dry needling;Passive range of motion   PT Next Visit Plan DC   Consulted and Agree with Plan of Care Patient      Patient will benefit from skilled therapeutic intervention in order to improve the following deficits and impairments:  Pain, Improper body mechanics, Increased muscle spasms, Decreased strength  Visit Diagnosis: Right-sided low back pain without sciatica  Bilateral low back pain, with sciatica presence unspecified  Abnormal posture  Stiffness of joint of right pelvic region and thigh  Weakness of trunk musculature       G-Codes - [id number]    Functional Assessment Tool Used FOTO   Functional Limitation Mobility: Walking and moving around   Mobility: Walking and Moving Around Goal Status [phone] At least 20 percent but less than 40 percent impaired, limited or restricted   Mobility: Walking and Moving Around Discharge  Status [phone] At least 1 percent but less than 20 percent impaired, limited or restricted  18%      Problem List Patient Active Problem List   Diagnosis Date Noted  . Bipolar disorder ([place] 07/04/2015  . Vaginitis 05/06/2015  . Furuncle 03/18/2015  . Idiopathic hypotension 02/03/2015  . At risk for abnormal blood glucose level 12/15/2013  . Preventative health care 12/19/2011  . Fibromyalgia 06/26/2010  . Chronic fatigue and depression 06/26/2010  . Obesity 01/17/2010    Class: Chronic  . Lumbar disc herniation of L5-S1 on the left side (MRI 2012 s/p surgery)  02/26/2008  . Allergic rhinitis 07/24/2006  . 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  LVoncille Lo PT 007/25/201712:23 PM Phone: [phone]: 3[place] [address]Phone: [phone]  Fax:  [phone] Name: Jordan MARONEMRN: [id number] of Birth: [birth year]  PHYSICAL THERAPY DISCHARGE SUMMARY  Visits from Start of Care: 7  Current functional level related to goals / functional outcomes: See above all goals achieved   Remaining deficits: Soreness in core but strength 4 to 5/5   Education / Equipment: HEP and continuing at [employer] Patient agrees to discharge.  Patient goals were met. Patient is being discharged due to meeting the stated rehab goals.  ????? and being pleased with current functional level

## 2015-09-05 ENCOUNTER — Encounter: Payer: Self-pay | Admitting: Physical Therapy

## 2015-09-07 ENCOUNTER — Encounter: Payer: Self-pay | Admitting: Physical Therapy

## 2015-09-13 ENCOUNTER — Ambulatory Visit (HOSPITAL_COMMUNITY): Payer: Self-pay | Admitting: Psychiatry

## 2015-09-14 ENCOUNTER — Encounter: Payer: Self-pay | Admitting: Physical Therapy

## 2015-09-15 ENCOUNTER — Encounter: Payer: Self-pay | Admitting: Physical Therapy

## 2015-09-19 ENCOUNTER — Ambulatory Visit (INDEPENDENT_AMBULATORY_CARE_PROVIDER_SITE_OTHER): Payer: Medicare Other | Admitting: Clinical

## 2015-09-19 ENCOUNTER — Encounter (HOSPITAL_COMMUNITY): Payer: Self-pay | Admitting: Clinical

## 2015-09-19 DIAGNOSIS — F3162 Bipolar disorder, current episode mixed, moderate: Secondary | ICD-10-CM | POA: Diagnosis not present

## 2015-09-19 NOTE — Progress Notes (Signed)
   THERAPIST PROGRESS NOTE  Session Time: 4:32 - 5:30  Participation Level: Active  Behavioral Response: CasualAlertDepressed  Type of Therapy: Individual Therapy  Treatment Goals addressed: improve psychiatric symptoms, controlled behavior, mood, and speech (improved social and work functioning), improve unhelpful thought patterns, elevate mood , and reduce impulsive behavior, interpersonal relationship skills  Interventions: Motivational Interviewing, CBT, pscychoeducation  Summary: Jordan [name] is a 35 y.o. female who presents with Moderate mixed bipolar I disorder.   Suicidal/Homicidal: No -without intent/plan  Therapist Response:  [name] met with clinician for an individual session. Taraya discussed her psychiatric symptoms, her current life events and her homework. Arnell had her friend join the session for support. The friend was not involved in the session. Lezly shared that she had been feeling good and so thought she might not need to take medication. She stopped taking her medication for 2 -3 days and became manic. Client and clinician discussed the importance of taking medication for symptoms management. Clinician asked open ended questions about her thoughts and feeling about the medication. Cecillia shared some about the stigma. Aritza shared that her symptoms are much more manageable with medication. Kenda and clinician discussed some work relationship issues which were triggering her. Client and clinician discussed her negative automatic thoughts and assumptions. Client and clinician discussed the evidence for and against the thoughts and assumptions. Rajvi was then able to formulate healthier alternative thoughts.Client and clinician reviewed her homework packet bipolar #6. Clinician gave Carneshia packet 7 for homework. She agreed to complete the packet and bring it back with her to next session. Tabytha agreed to continue to practice her grounding and mindfulness  techniques Plan: Return again in 1 week.  Diagnosis: Axis I: Moderate mixed bipolar I disorder.    Teresha Hanks A, LCSW 09/19/2015

## 2015-09-23 ENCOUNTER — Encounter (HOSPITAL_COMMUNITY): Payer: Self-pay | Admitting: Clinical

## 2015-09-28 ENCOUNTER — Telehealth: Payer: Self-pay | Admitting: Internal Medicine

## 2015-09-28 NOTE — Telephone Encounter (Signed)
APT. REMINDER CALL, LMTCB °

## 2015-09-29 ENCOUNTER — Encounter: Payer: Self-pay | Admitting: Internal Medicine

## 2015-09-29 ENCOUNTER — Ambulatory Visit (INDEPENDENT_AMBULATORY_CARE_PROVIDER_SITE_OTHER): Payer: Medicare Other | Admitting: Internal Medicine

## 2015-09-29 VITALS — BP 95/46 | HR 63 | Temp 98.5°F | Ht 69.0 in | Wt 213.9 lb

## 2015-09-29 DIAGNOSIS — G609 Hereditary and idiopathic neuropathy, unspecified: Secondary | ICD-10-CM

## 2015-09-29 DIAGNOSIS — F319 Bipolar disorder, unspecified: Secondary | ICD-10-CM | POA: Diagnosis not present

## 2015-09-29 DIAGNOSIS — F3170 Bipolar disorder, currently in remission, most recent episode unspecified: Secondary | ICD-10-CM | POA: Diagnosis not present

## 2015-09-29 DIAGNOSIS — M797 Fibromyalgia: Secondary | ICD-10-CM | POA: Diagnosis present

## 2015-09-29 LAB — GLUCOSE, CAPILLARY: Glucose-Capillary: 86 mg/dL (ref 65–99)

## 2015-09-29 MED ORDER — PREGABALIN 150 MG PO CAPS: 150.0000 mg | ORAL_CAPSULE | Freq: Two times a day (BID) | ORAL | 1 refills | Status: DC

## 2015-09-29 NOTE — Patient Instructions (Addendum)
Jordan [name]  Please start taking Lyrica 150mg  two times daily Please follow up in one month to assess pain on Lyrica  You will have lab work done today

## 2015-09-29 NOTE — Assessment & Plan Note (Addendum)
Assessment:  Fibromyalgia Flare up at the end of July.  Patient reported increasing her dose of Lyrica 75mg  from once daily to twice daily for 2 weeks.  This has helped but still has hand and feet pain.  PHQ-9 was 9 today.  Plan - Increase Lyrica from 75 BID to 150mg  BID - Follow up in one month to assess Lyrica efficacy - Re-check PHQ-9 on following visit  - Reassess abdominal pain again.  Patient had no alarming symptoms at current visit

## 2015-09-29 NOTE — Assessment & Plan Note (Signed)
Assessment:  Bipolar Follows with Dr. Adele Schilder.  Takes Depakote 500mg  once daily Dr. Adele Schilder wanted a valproic acid level checked   Plan - Valproic Acid lab

## 2015-09-29 NOTE — Assessment & Plan Note (Signed)
Assessment:  Peripheral neuropathy  Plan - fasting glucose, if elevated get A1C

## 2015-09-29 NOTE — Progress Notes (Signed)
   CC: Fibromyalgia flare  HPI:  [name] is a 35 y.o. female with PMHx of fibromyalgia and bipolar presenting today for a flare up of her fibromyalgia.  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  Around July 27th she woke up and had pains throughout her body.  She described them as electric shocks down her arms and legs.  She was taking Lyrica 75mg  once daily and after waking up with body pain has been taking  Lyrica 75mg  BID.  She has been doing this for approximately 2 weeks.  In the past she has been on higher doses up to 400mg  daily one year ago but was being weaned down since she was feeling well, exercising and eating well.  Her new job has not allowed her to relax well after work.  Lyrica 75mg  BID has helped with body pains but she continues to have pain in her hands and feet.  She states she also takes Tramadol which she does not receive from our clinic, but from her Orthopedic surgeon Frederik Pear as noted per narcotic database.      She denies any burning sensation, difficulty urinating or having bowel movements. She states she has some mild abdominal pain that comes and goes but denies any nausea, vomiting, diarrhea or constipation.      Past Medical History:  Diagnosis Date  . Allergic rhinitis    pt takes flonase for episodes, no episodes in 2012  . Anxiety   . Basal cell carcinoma    recurrent in left maxillary, has had 5 surguries, last one 2003  . Candidiasis, vagina    recurrent, pt has made lifestyle modifications and none recently (as of 8/12)  . Cyst of nasal sinus   . Fibroma    left ovarian  . Fibromyalgia   . Gardnerella infection    + in 08/08  . GERD (gastroesophageal reflux disease)    occ  . Herniated disc   . OVARIAN CYSTECTOMY, HX OF 03/01/2006   ovarian fibroma-left 1996, L ovary and fallopian tube removed    . Paronychia of third finger of left hand 07/28/2010  . PLANTAR FASCIITIS, BILATERAL 05/24/2009  . Whitlow    hx of herpetic  requiring I+D,( was bitten by autistic child that cares fr    Review of Systems:  Review of Systems  Constitutional: Negative for chills and fever.  Gastrointestinal: Positive for abdominal pain.  Genitourinary: Negative for dysuria, frequency and urgency.  Musculoskeletal: Positive for myalgias.  Neurological: Positive for tingling.  Psychiatric/Behavioral: Negative for suicidal ideas.     Physical Exam:  Vitals:   09/29/15 0910  BP: (!) 95/46  Pulse: 63  Temp: 98.5 F (36.9 C)  TempSrc: Oral  SpO2: 100%  Weight: 213 lb 14.4 oz (97 kg)  Height: 5\' 9"  (1.753 m)   Physical Exam  Constitutional: She appears well-developed.  Cardiovascular: Normal rate and regular rhythm.   Pulmonary/Chest: Effort normal and breath sounds normal.  Abdominal: Soft. She exhibits no distension.  Mild tenderness on right upper quadrant, no masses noted   Musculoskeletal: She exhibits no edema.  Motor 5/5 in upper and lower extremity    Skin: Skin is warm.  Psychiatric: She has a normal mood and affect.    Assessment & Plan:   See encounters tab for problem based medical decision making.   Patient seen with Dr. Daryll Drown

## 2015-09-30 ENCOUNTER — Telehealth: Payer: Self-pay | Admitting: *Deleted

## 2015-09-30 ENCOUNTER — Encounter: Payer: Self-pay | Admitting: *Deleted

## 2015-09-30 LAB — VALPROIC ACID LEVEL: Valproic Acid Lvl: 35 ug/mL — ABNORMAL LOW (ref 50–100)

## 2015-09-30 NOTE — Telephone Encounter (Signed)
Lab results from 09-29-15 faxed to Dr Berniece Andreas  [phone]  09-30-15  Jordan [name], [place]

## 2015-10-03 ENCOUNTER — Ambulatory Visit (INDEPENDENT_AMBULATORY_CARE_PROVIDER_SITE_OTHER): Payer: Medicare Other | Admitting: Clinical

## 2015-10-03 DIAGNOSIS — F3162 Bipolar disorder, current episode mixed, moderate: Secondary | ICD-10-CM

## 2015-10-03 NOTE — Progress Notes (Signed)
Internal Medicine Clinic Attending  I saw and evaluated the patient.  I personally confirmed the key portions of the history and exam documented by Dr. Hoffman and I reviewed pertinent patient test results.  The assessment, diagnosis, and plan were formulated together and I agree with the documentation in the resident's note.      

## 2015-10-03 NOTE — Progress Notes (Signed)
   THERAPIST PROGRESS NOTE  Session Time: 4:31 -5:05   Participation Level: Active  Behavioral Response: CasualAlertDepressed   Type of Therapy: Individual Therapy  Treatment Goals addressed: improve psychiatric symptoms, controlled behavior, mood,  (improved social and work functioning), improve unhelpful thought patterns, elevate mood (increased hopefulness ), and reduce impulsive behavior,   Interventions: Motivational Interviewing, CBT, Grounding and Mindfulness Techniques   Summary: SWAN FAIRFAX is a 35 y.o. female who presents with Moderate mixed bipolar I disorder.   Suicidal/Homicidal: No -without intent/plan  Therapist Response:  [name] met with clinician for an individual session. Gia discussed her psychiatric symptoms, her current life events and her homework. Peggie shared that her depression has been increased because she has been in a lot of physical pain due to medical issues. Clinician asked open ended questions and Carnita shared that she had to find another job because of her physical issues. Client and clinician discussed her thoughts and emotions. [name] and clinician discussed catastrophic thinking. Client and clinician discussed how to challenge those thoughts. Clinician walked her through the process and Takiya shared she felt more hopeful. Timber shared that she completed her homework. Client and clinician discussed behavioral strategies for preventing mania.   Plan: Return again in 2 weeks.  Diagnosis: Axis I: Moderate mixed bipolar I disorder.    Edrian Melucci A, LCSW 10/03/2015

## 2015-10-10 ENCOUNTER — Encounter (HOSPITAL_COMMUNITY): Payer: Self-pay | Admitting: Clinical

## 2015-10-13 ENCOUNTER — Encounter (HOSPITAL_COMMUNITY): Payer: Self-pay | Admitting: Psychiatry

## 2015-10-13 ENCOUNTER — Ambulatory Visit (INDEPENDENT_AMBULATORY_CARE_PROVIDER_SITE_OTHER): Payer: Medicare Other | Admitting: Psychiatry

## 2015-10-13 ENCOUNTER — Other Ambulatory Visit (HOSPITAL_COMMUNITY): Payer: Self-pay | Admitting: Psychiatry

## 2015-10-13 DIAGNOSIS — F3132 Bipolar disorder, current episode depressed, moderate: Secondary | ICD-10-CM

## 2015-10-13 MED ORDER — DIVALPROEX SODIUM ER 500 MG PO TB24: 500.0000 mg | ORAL_TABLET | Freq: Every day | ORAL | 2 refills | Status: DC

## 2015-10-13 NOTE — Progress Notes (Signed)
BH MD/PA/NP OP Progress Note  10/13/2015 12:45 PM Jordan [name]  MRN:  [id number]  Chief Complaint: routine med check Subjective:  Says she is doing very well HPI: says things are going very well for he.  Moods have been very stable since depakote was increased.  Anger is under control, she is sleeping well, she is not drinking.  She is pleased with her therapist.  She asked about her recent blood work and that was reviewed with her particularly the CBC, depakote level and liver functions. Visit Diagnosis:    ICD-9-CM ICD-10-CM   1. Bipolar affective disorder, currently depressed, moderate (HCC) 296.52 F31.32 divalproex (DEPAKOTE ER) 500 MG 24 hr tablet    Past Psychiatric History: no changes  Past Medical History:  Past Medical History:  Diagnosis Date  . Allergic rhinitis    pt takes flonase for episodes, no episodes in 2012  . Anxiety   . Basal cell carcinoma    recurrent in left maxillary, has had 5 surguries, last one 2003  . Candidiasis, vagina    recurrent, pt has made lifestyle modifications and none recently (as of 8/12)  . Cyst of nasal sinus   . Fibroma    left ovarian  . Fibromyalgia   . Gardnerella infection    + in 08/08  . GERD (gastroesophageal reflux disease)    occ  . Herniated disc   . OVARIAN CYSTECTOMY, HX OF 03/01/2006   ovarian fibroma-left 1996, L ovary and fallopian tube removed    . Paronychia of third finger of left hand 07/28/2010  . PLANTAR FASCIITIS, BILATERAL 05/24/2009  . Whitlow    hx of herpetic requiring I+D,( was bitten by autistic child that cares fr    Past Surgical History:  Procedure Laterality Date  . CHOLECYSTECTOMY    . LEFT OOPHORECTOMY    . LUMBAR LAMINECTOMY/DECOMPRESSION MICRODISCECTOMY N/A 08/14/2012   Procedure: LUMBAR LAMINECTOMY/DECOMPRESSION MICRODISCECTOMY Lumbar 5 -sacrum 1 decompression;  Surgeon: Sinclair Ship, MD;  Location: [place];  Service: Orthopedics;  Laterality: N/A;  Lumbar 5 -sacrum 1 decompression  .  MANDIBLE RECONSTRUCTION  89   upper anfd lower jaw cyst  . NASAL SINUS SURGERY     X 2 at age 41 & 67    Family Psychiatric History: no changes  Family History:  Family History  Problem Relation Age of Onset  . Stomach cancer      uncle  . Breast cancer      two aunts  . Cancer      unknown grandfather  . Diabetes      grandmother, aunt and 2 uncles  . Diabetes Maternal Grandmother   . Bipolar disorder Mother   . Alcohol abuse Mother   . Drug abuse Mother   . Drug abuse Sister   . Drug abuse Brother     Social History:  Social History   Social History  . Marital status: Single    Spouse name: N/A  . Number of children: N/A  . Years of education: N/A   Occupational History  . unemployed Unemployed  . disability    Social History Main Topics  . Smoking status: Never Smoker  . Smokeless tobacco: None  . Alcohol use 0.0 oz/week     Comment:  Drinks on rare occassions  . Drug use: No  . Sexual activity: Yes    Birth control/ protection: None   Other Topics Concern  . None   Social History Narrative   Lives in [place]  in house with partner    Allergies: No Known Allergies  Metabolic Disorder Labs: Lab Results  Component Value Date   HGBA1C 5.6 12/15/2013   No results found for: PROLACTIN Lab Results  Component Value Date   CHOL 215 (H) 06/16/2012   TRIG 93 06/16/2012   HDL 83 06/16/2012   CHOLHDL 2.6 06/16/2012   VLDL 19 06/16/2012   LDLCALC 113 (H) 06/16/2012   LDLCALC 158 (H) 09/27/2010     Current Medications: Current Outpatient Prescriptions  Medication Sig Dispense Refill  . cholecalciferol (VITAMIN D) 1000 UNITS tablet Take 1,000 Units by mouth daily.    . cyclobenzaprine (FLEXERIL) 5 MG tablet Take 1 tablet (5 mg total) by mouth at bedtime as needed for muscle spasms. 30 tablet 0  . divalproex (DEPAKOTE ER) 500 MG 24 hr tablet Take 1 tablet (500 mg total) by mouth daily. 30 tablet 2  . fluticasone (FLONASE) 50 MCG/ACT nasal spray  Place 2 sprays into both nostrils daily as needed for allergies or rhinitis. 16 g 0  . magnesium citrate SOLN Take 1 Bottle by mouth once.    . Multiple Vitamins-Minerals (ADULT ONE DAILY GUMMIES PO) Take 2 tablets by mouth daily.    . pregabalin (LYRICA) 150 MG capsule Take 1 capsule (150 mg total) by mouth 2 (two) times daily. 60 capsule 1  . traMADol (ULTRAM) 50 MG tablet Take 1 tablet (50 mg total) by mouth every 12 (twelve) hours as needed for severe pain. 20 tablet 0   No current facility-administered medications for this visit.     Neurologic: Headache: Negative Seizure: Negative Paresthesias: Negative  Musculoskeletal: Strength & Muscle Tone: within normal limits Gait & Station: normal Patient leans: N/A  Psychiatric Specialty Exam: ROS  Blood pressure 122/70, pulse 69, height 5\' 9"  (1.753 m), weight 215 lb 9.6 oz (97.8 kg), last menstrual period 08/26/2015.Body mass index is 31.84 kg/m.  General Appearance: Well Groomed  Eye Contact:  Good  Speech:  Clear and Coherent  Volume:  Normal  Mood:  Euthymic  Affect:  Appropriate  Thought Process:  Coherent  Orientation:  Full (Time, Place, and Person)  Thought Content: Logical   Suicidal Thoughts:  No  Homicidal Thoughts:  No  Memory:  Immediate;   Good Recent;   Good Remote;   Good  Judgement:  Intact  Insight:  Fair  Psychomotor Activity:  Normal  Concentration:  Concentration: Good and Attention Span: Good  Recall:  Good  Fund of Knowledge: Good  Language: Good  Akathisia:  Negative  Handed:  Right  AIMS (if indicated):  0  Assets:  Communication Skills Desire for Improvement Financial Resources/Insurance Housing Intimacy Leisure Time Physical Health Resilience Social Support Talents/Skills Transportation Vocational/Educational  ADL's:  Intact  Cognition: WNL  Sleep:  good     Treatment Plan Summary: Mood disorder:  Moods stable, no depression.  Continue Depakote500 mg daily Recent lab work  reviewed with her  return in 3 months   Donnelly Angelica, MD 10/13/2015, 12:45 PM

## 2015-10-17 ENCOUNTER — Ambulatory Visit (INDEPENDENT_AMBULATORY_CARE_PROVIDER_SITE_OTHER): Payer: Medicare Other | Admitting: Clinical

## 2015-10-17 DIAGNOSIS — F3162 Bipolar disorder, current episode mixed, moderate: Secondary | ICD-10-CM | POA: Diagnosis not present

## 2015-10-17 NOTE — Progress Notes (Signed)
   THERAPIST PROGRESS NOTE  Session Time: 4:30 - 5:25  Participation Level: Active  Behavioral Response: CasualAlertAnxious and Depressed  Type of Therapy: Individual Therapy  Treatment Goals addressed: improve psychiatric symptoms, controlled behavior, mood, and speech (improved social and work functioning), improve unhelpful thought patterns, elevate mood (increased hopefulness and social interactions), and reduce impulsive behavior, interpersonal relationship skills  Interventions: Motivational Interviewing, CBT,   Summary: Demeshia Y Hendryx is a 35 y.o. female who presents with Moderate mixed bipolar I disorder.   Suicidal/Homicidal: No -without intent/plan  Therapist Response:  Cassidy met with clinician for an individual session. Kourtnei discussed her psychiatric symptoms, her current life events and her homework. Thana shared that she had completed the homework. Client and clinician reviewed and discussed the insight. Kennadee shared that she is getting a better understanding a bout her diagnosis. She shared about some other symptoms which are related to her past trauma. Clinician asked open ended questions and Kealey shared that these symptoms have been going on for a long time. She shared that she had denied them before because she was concerned a bout working on them. Client and clinician discussed her symptoms and the diagnosis of PTSD. Kanoelani shared that she would like to work on her past trauma. Client and clinician discussed what this would look like. Client and clinician agreed to update her assessment and treatment plan next session. Client and clinician discussed how her symptoms of bipolar overlapped with her trauma symptoms (PTSD). Client and clinician practiced a grounding technique to together.  Plan: Return again in 2 week.  Diagnosis: Axis I: Moderate mixed bipolar I disorder.    , A, LCSW 10/17/2015  

## 2015-10-19 ENCOUNTER — Encounter: Payer: Self-pay | Admitting: Internal Medicine

## 2015-10-19 ENCOUNTER — Ambulatory Visit (INDEPENDENT_AMBULATORY_CARE_PROVIDER_SITE_OTHER): Payer: Medicare Other | Admitting: Internal Medicine

## 2015-10-19 VITALS — BP 89/51 | HR 57 | Temp 97.6°F | Ht 69.0 in | Wt 217.6 lb

## 2015-10-19 DIAGNOSIS — F3170 Bipolar disorder, currently in remission, most recent episode unspecified: Secondary | ICD-10-CM

## 2015-10-19 DIAGNOSIS — E669 Obesity, unspecified: Secondary | ICD-10-CM

## 2015-10-19 DIAGNOSIS — F319 Bipolar disorder, unspecified: Secondary | ICD-10-CM

## 2015-10-19 DIAGNOSIS — M797 Fibromyalgia: Secondary | ICD-10-CM

## 2015-10-19 MED ORDER — IBUPROFEN 200 MG PO TABS: 400.0000 mg | ORAL_TABLET | Freq: Four times a day (QID) | ORAL | 0 refills | Status: DC | PRN

## 2015-10-19 NOTE — Assessment & Plan Note (Signed)
Patient has had significant symptom improvement since increasing Lyrica to 150 mg BID approximately 2 weeks ago. She states she only had pain when she was at work and has since left that job for a new less physically demanding job. She complains now of some muscle soreness which she believes is from lifting heavy boxes. Has not tried any OTC medications.  -Continue Lyrica 150 mg BID -Sent in prescription for Ibuprofen 400 mg for her muscle pain  -Follow up in 6 months or as needed
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
## 2015-10-19 NOTE — Assessment & Plan Note (Signed)
Patient follows with Dr. Adele Schilder. She is stable on Depakote 500 mg daily.  -Continue following up with psychiatry.

## 2015-10-19 NOTE — Patient Instructions (Signed)
It was a pleasure meeting you today!  1. Today we talked about your Fibromyalgia pain. I am glad that it has been under control since starting Lyrica 150 mg twice a day as well as changing jobs. I have sent in a prescription for Ibuprofen 400 mg which you can take for your muscle pain from heavy lifting from your prior job.  2. Please see [place] back in the clinic in 6 months, unless you need to see [place] sooner!

## 2015-10-19 NOTE — Progress Notes (Signed)
Internal Medicine Clinic Attending  Case discussed with Dr. Molt at the time of the visit.  We reviewed the resident's history and exam and pertinent patient test results.  I agree with the assessment, diagnosis, and plan of care documented in the resident's note. 

## 2015-10-25 ENCOUNTER — Encounter (HOSPITAL_COMMUNITY): Payer: Self-pay | Admitting: Clinical

## 2015-10-26 ENCOUNTER — Other Ambulatory Visit: Payer: Self-pay | Admitting: *Deleted

## 2015-10-26 MED ORDER — FLUTICASONE PROPIONATE 50 MCG/ACT NA SUSP: 2.0000 | Freq: Every day | NASAL | 0 refills | Status: DC | PRN

## 2015-10-31 ENCOUNTER — Ambulatory Visit (HOSPITAL_COMMUNITY): Payer: Self-pay | Admitting: Clinical

## 2015-11-07 ENCOUNTER — Ambulatory Visit (INDEPENDENT_AMBULATORY_CARE_PROVIDER_SITE_OTHER): Payer: Medicare Other | Admitting: Clinical

## 2015-11-07 DIAGNOSIS — F4310 Post-traumatic stress disorder, unspecified: Secondary | ICD-10-CM

## 2015-11-07 DIAGNOSIS — F3162 Bipolar disorder, current episode mixed, moderate: Secondary | ICD-10-CM

## 2015-11-07 NOTE — Progress Notes (Signed)
   THERAPIST PROGRESS NOTE  Session Time: 4:33  Participation Level: Active  Behavioral Response: CasualAlertAnxious  Type of Therapy: Individual Therapy  Treatment Goals addressed: improve psychiatric symptoms, learn about diagnosis, discuss trauma  Interventions: CPT  Summary: [name] DUNGEE is a 35 y.o. female who presents with Moderate mixed bipolar I disorder and PTSD   Suicidal/Homicidal: No -without intent/plan  Therapist Response:  Daly met with clinician for an individual session. Suanne discussed her psychiatric symptoms, her current life events and her homework. [name] and clinician updated her assessment and discussed her current symptoms. Oluwadara shared that her symptoms have been high lately. She shared that she was more aware of PTSD symptoms and  her bipolar symptoms. Client and clinician discussed their differences and overlap/ Clinician asked open ended questions and Daphanie shared a bout angry outbursts and flashbacks. Client and clinician worked together to update her assessment to reflect PTSD diagnosis. Mylan had expressed last session that she would like to work on her trauma as her symptoms are interfering with her daily life.Client and clinician discussed PTSD symptoms and the goal of therapy. Client and clinician discussed cognitive processing therapy. Clinician introduced an impact statement. Client and clinician discussed how to complete the impact statement.   Lec -5 = 70 PHQ-9 =21  Plan: Return again in 2 week.  Diagnosis: Axis I: Moderate mixed bipolar I disorder  And PTSD .   Larue Drawdy A, LCSW 11/07/2015

## 2015-11-09 ENCOUNTER — Ambulatory Visit (INDEPENDENT_AMBULATORY_CARE_PROVIDER_SITE_OTHER): Payer: Medicare Other | Admitting: Internal Medicine

## 2015-11-09 ENCOUNTER — Encounter: Payer: Self-pay | Admitting: Internal Medicine

## 2015-11-09 VITALS — BP 107/53 | HR 58 | Temp 98.2°F | Ht 69.0 in | Wt 221.4 lb

## 2015-11-09 DIAGNOSIS — M797 Fibromyalgia: Secondary | ICD-10-CM

## 2015-11-09 DIAGNOSIS — F4310 Post-traumatic stress disorder, unspecified: Secondary | ICD-10-CM | POA: Insufficient documentation

## 2015-11-09 DIAGNOSIS — G4700 Insomnia, unspecified: Secondary | ICD-10-CM | POA: Diagnosis not present

## 2015-11-09 MED ORDER — PREGABALIN 150 MG PO CAPS: 150.0000 mg | ORAL_CAPSULE | Freq: Two times a day (BID) | ORAL | 1 refills | Status: DC

## 2015-11-09 NOTE — Assessment & Plan Note (Signed)
Endorses significant symptom relief since last visit 3 weeks ago with taking the increased dose Lyrica 150 mg BID. Continues to have moderate joint pains, attributed to fibromyalgia, worst in her elbows, knees, wrists, ankles. These pains are now tolerable with the medication adjustment. She also takes ibuprofen 400 mg every now and then as needed, which also helps. Her fibromyalgia symptoms also improve with exercise and activity - she mentions playing in softball game last night that she enjoyed. No joint swelling or concerning findings on exam.  Plan: - Continue Lyrica 150 mg BID - Continue ibuprofen 400 mg every 6 when necessary - Follow up in 109mo to 1 year

## 2015-11-09 NOTE — Assessment & Plan Note (Addendum)
Endorses difficulty falling and staying asleep for weeks to months. Some relation to her PTSD symptoms, reports hypervigilance and nightmares. She is seeing behavioral health to treat her PTSD and is finding this effective. Has tried melatonin in the past for sleep and knows it works for her.  Plan: - Melatonin OTC QHS PRN - Continue to see behavioral health for treatment of PTSD
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
## 2015-11-09 NOTE — Patient Instructions (Signed)
Continue to take the Lyrica and other medications.  Try Melatonin for sleep!  Thanks for visiting!

## 2015-11-11 NOTE — Progress Notes (Signed)
Internal Medicine Clinic Attending  I saw and evaluated the patient.  I personally confirmed the key portions of the history and exam documented by Dr. Johnson and I reviewed pertinent patient test results.  The assessment, diagnosis, and plan were formulated together and I agree with the documentation in the resident's note.  

## 2015-11-14 ENCOUNTER — Encounter (HOSPITAL_COMMUNITY): Payer: Self-pay | Admitting: Clinical

## 2015-11-14 ENCOUNTER — Ambulatory Visit (INDEPENDENT_AMBULATORY_CARE_PROVIDER_SITE_OTHER): Payer: Medicare Other | Admitting: Clinical

## 2015-11-14 DIAGNOSIS — F3162 Bipolar disorder, current episode mixed, moderate: Secondary | ICD-10-CM

## 2015-11-14 DIAGNOSIS — F4310 Post-traumatic stress disorder, unspecified: Secondary | ICD-10-CM

## 2015-11-14 NOTE — Progress Notes (Signed)
   Jordan [name] problem snowballs  THERAPIST PROGRESS NOTE  Session Time: 4:30 - 5:27  Participation Level: Active  Behavioral Response: CasualAlertAnxious and Depressed   Type of Therapy: Individual Therapy  Treatment Goals addressed: , improve unhelpful thought patterns,  discuss and process trauma, learn about diagnosis,  Interventions: Motivational Interviewing, CBT, Grounding and Mindfulness Techniques   Summary: Jordan [name] is a 35 y.o. female who presents with Moderate mixed bipolar I disorder and PTSD  Suicidal/Homicidal: No -without intent/plan  Therapist Response:  Jordan [name] met with clinician for an individual session. Jordan [name] discussed her psychiatric symptoms, her current life events and her homework. Jordan [name] shared that she has been having a tough few last days. She shared that her stepfather is in the hospital and her on one into hospice. She shared that a positive was that her niece moved out yesterday this is the niece that she raised. Jordan [name] shared that she had completed her homework. Jordan [name] and clinician discussed her homework which was to create an impact statement. Jordan [name] and clinician reviewed the impact statement and discussed stuck points. Jordan [name] and clinician worked on formulating her Impact statement into stuck point sentences.  Clinician introduced ABC worksheet. Jordan [name] and clinician discussed the thought emotion connection.  Jordan [name] and clinician discussed how this relates to her diagnosis  Jordan [name] = 57 PHQ-9 =21   Plan: Return again in 2 week.  Diagnosis: Axis I: Moderate mixed bipolar I disorder and PTSD    Penny Arrambide A, LCSW 11/14/2015
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
## 2015-11-21 ENCOUNTER — Encounter (HOSPITAL_COMMUNITY): Payer: Self-pay | Admitting: Clinical

## 2015-11-21 ENCOUNTER — Ambulatory Visit (HOSPITAL_COMMUNITY): Payer: Self-pay | Admitting: Clinical

## 2015-11-28 ENCOUNTER — Ambulatory Visit (INDEPENDENT_AMBULATORY_CARE_PROVIDER_SITE_OTHER): Payer: Medicare Other | Admitting: Clinical

## 2015-11-28 DIAGNOSIS — F4310 Post-traumatic stress disorder, unspecified: Secondary | ICD-10-CM

## 2015-11-28 DIAGNOSIS — F3162 Bipolar disorder, current episode mixed, moderate: Secondary | ICD-10-CM | POA: Diagnosis not present

## 2015-11-28 NOTE — Progress Notes (Signed)
   THERAPIST PROGRESS NOTE  Session Time: 4:35 - 5:30   Participation Level: Active  Behavioral Response: CasualAlertDepressed  Type of Therapy: Individual Therapy  Treatment Goals addressed: improve psychiatric symptoms,  improve unhelpful thought patterns, elevate mood (increased hopefulness ),  discuss and process trauma, healthy coping skills  Interventions: Motivational Interviewing, CPT  Summary: Jordan [name] is a 35 y.o. female who presents with Moderate mixed bipolar I disorder and PTSD  Suicidal/Homicidal: No -without intent/plan  Therapist Response:  [name] met with clinician for an individual session. [name] discussed her psychiatric symptoms, her current life events and her homework. [name] shared that she has been  Having ups and downs since last session. She shared that she has not had any mania just depression. She shared that her Aunt's Chemo did not work. She was having some difficulty with her significant relationship. She had some other issues too. [name] shared that she did complete her homework. Client and clinician reviewed and discussed her ABC worksheets. [name] shared a bout her process while reviewing the worksheets. Clinician asked open ended questions and [name] explored her answers further. Client and clinician discussed the process and her changing perspectives. [name] agreed to complete a worksheet a day between now and next session  Plan: Return again in 2 week.  Diagnosis: Axis I: Moderate mixed bipolar I disorder and PTSD    Gavino Fouch A, LCSW 11/28/2015

## 2015-12-05 ENCOUNTER — Ambulatory Visit (HOSPITAL_COMMUNITY): Payer: Self-pay | Admitting: Clinical

## 2015-12-06 ENCOUNTER — Encounter (HOSPITAL_COMMUNITY): Payer: Self-pay | Admitting: Clinical

## 2015-12-12 ENCOUNTER — Ambulatory Visit (HOSPITAL_COMMUNITY): Payer: Self-pay | Admitting: Clinical

## 2015-12-19 ENCOUNTER — Ambulatory Visit (INDEPENDENT_AMBULATORY_CARE_PROVIDER_SITE_OTHER): Payer: Medicare Other | Admitting: Clinical

## 2015-12-19 DIAGNOSIS — F3162 Bipolar disorder, current episode mixed, moderate: Secondary | ICD-10-CM | POA: Diagnosis not present

## 2015-12-19 DIAGNOSIS — F4310 Post-traumatic stress disorder, unspecified: Secondary | ICD-10-CM | POA: Diagnosis not present

## 2015-12-19 NOTE — Progress Notes (Signed)
   THERAPIST PROGRESS NOTE  Session Time: 4:24 - 5:30   Participation Level: Active  Behavioral Response: CasualAlertAnxious and Depressed  TyType of Therapy: Individual Therapy  Treatment Goals addressed: improve psychiatric symptoms,  moderate mood, improve unhelpful thought patterns, elevate mood (increased hopefulness),  discuss and process trauma, learn about diagnosis, healthy coping skills  Interventions: CPT  Summary: Jordan [name] is a 35 y.o. female who presents with Moderate mixed bipolar I disorder and PTSD  Suicidal/Homicidal: No -without intent/plan  Therapist Response:  Monserrath met with clinician for an individual session. Neila discussed her psychiatric symptoms, her current life events and her homework. Glee shared that she has been experiencing increased PTSD symptoms. She shared that she had difficulty due to increased anxiety. Clinician asked open ended questions and Ian shared about her experience and her avoidance. Client and clinician discussed PTSD symptoms and her resistance. Clinician asked open ended questions and Himani recognized that she was avoiding feeling her emotions. She cried as client and clinician reviewed her homework. Alazay stated "this was not my fault." Clinician agreed. Client and clinician discussed natural emotions. Darline shared that she felt better after the session and more confident that she could feel natural emotions and still be "okay" Marika agreed to continue doing her homework until next session.  Plan: Return again in 2 week.  Diagnosis: Axis I: Moderate mixed bipolar I disorder and PTSD    Shenika Quint A, LCSW 12/19/2015

## 2015-12-23 ENCOUNTER — Other Ambulatory Visit: Payer: Self-pay | Admitting: Internal Medicine

## 2015-12-23 DIAGNOSIS — M797 Fibromyalgia: Secondary | ICD-10-CM

## 2015-12-23 MED ORDER — TRAMADOL HCL 50 MG PO TABS: 50.0000 mg | ORAL_TABLET | Freq: Two times a day (BID) | ORAL | 0 refills | Status: DC | PRN

## 2015-12-26 ENCOUNTER — Ambulatory Visit (INDEPENDENT_AMBULATORY_CARE_PROVIDER_SITE_OTHER): Payer: Medicare Other | Admitting: Clinical

## 2015-12-26 ENCOUNTER — Encounter (HOSPITAL_COMMUNITY): Payer: Self-pay | Admitting: Clinical

## 2015-12-26 DIAGNOSIS — F4310 Post-traumatic stress disorder, unspecified: Secondary | ICD-10-CM

## 2015-12-26 DIAGNOSIS — F3162 Bipolar disorder, current episode mixed, moderate: Secondary | ICD-10-CM
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
## 2015-12-28 ENCOUNTER — Encounter (HOSPITAL_COMMUNITY): Payer: Self-pay | Admitting: Clinical

## 2015-12-28 ENCOUNTER — Telehealth: Payer: Self-pay | Admitting: Internal Medicine

## 2015-12-28 DIAGNOSIS — S161XXA Strain of muscle, fascia and tendon at neck level, initial encounter: Secondary | ICD-10-CM | POA: Insufficient documentation

## 2015-12-28 DIAGNOSIS — Z85828 Personal history of other malignant neoplasm of skin: Secondary | ICD-10-CM | POA: Diagnosis not present

## 2015-12-28 DIAGNOSIS — Y999 Unspecified external cause status: Secondary | ICD-10-CM | POA: Insufficient documentation

## 2015-12-28 DIAGNOSIS — Y92410 Unspecified street and highway as the place of occurrence of the external cause: Secondary | ICD-10-CM | POA: Insufficient documentation

## 2015-12-28 DIAGNOSIS — S199XXA Unspecified injury of neck, initial encounter: Secondary | ICD-10-CM | POA: Diagnosis not present

## 2015-12-28 DIAGNOSIS — Y939 Activity, unspecified: Secondary | ICD-10-CM | POA: Insufficient documentation

## 2015-12-28 NOTE — Telephone Encounter (Signed)
APT. REMINDER CALL, LMTCB °

## 2015-12-29 ENCOUNTER — Ambulatory Visit: Payer: Self-pay

## 2015-12-29 ENCOUNTER — Emergency Department (HOSPITAL_COMMUNITY)
Admission: EM | Admit: 2015-12-29 | Discharge: 2015-12-29 | Disposition: A | Discharge status: Home / Self Care | Payer: No Typology Code available for payment source | Attending: Emergency Medicine | Admitting: Emergency Medicine

## 2015-12-29 ENCOUNTER — Encounter (HOSPITAL_COMMUNITY): Payer: Self-pay | Admitting: Emergency Medicine

## 2015-12-29 DIAGNOSIS — S161XXA Strain of muscle, fascia and tendon at neck level, initial encounter: Secondary | ICD-10-CM

## 2015-12-29 MED ORDER — IBUPROFEN 800 MG PO TABS: 800.0000 mg | ORAL_TABLET | Freq: Three times a day (TID) | ORAL | 0 refills | Status: DC

## 2015-12-29 MED ORDER — ACETAMINOPHEN 325 MG PO TABS
650.0000 mg | ORAL_TABLET | Freq: Once | ORAL | Status: AC
  Administered 2015-12-29: 650 mg via ORAL
  Filled 2015-12-29: qty 2

## 2015-12-29 NOTE — ED Notes (Signed)
PA-C at bedside 

## 2015-12-29 NOTE — ED Notes (Signed)
Pt verbalized understanding discharge instructions and denies any further needs or questions at this time. VS stable, ambulatory and steady gait.   

## 2015-12-29 NOTE — Discharge Instructions (Signed)
Apply ice to affected area. Increase your mobility. Take ibuprofen and home muscle relaxers as needed for pain and inflammation. Follow up with your primary care provider if your symptoms do not improve. Return to the ED if you experience loss of consciousness, blurry vision, vomiting, loss of control of your bowel and bladder, numbness or tingling in your lower extremities.
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
## 2015-12-30 ENCOUNTER — Ambulatory Visit (INDEPENDENT_AMBULATORY_CARE_PROVIDER_SITE_OTHER): Payer: Medicare Other | Admitting: Internal Medicine

## 2015-12-30 DIAGNOSIS — R8290 Unspecified abnormal findings in urine: Secondary | ICD-10-CM | POA: Diagnosis not present

## 2015-12-30 DIAGNOSIS — M797 Fibromyalgia: Secondary | ICD-10-CM | POA: Diagnosis not present

## 2015-12-30 DIAGNOSIS — Z79899 Other long term (current) drug therapy: Secondary | ICD-10-CM

## 2015-12-30 MED ORDER — DULOXETINE HCL 30 MG PO CPEP: 30.0000 mg | ORAL_CAPSULE | Freq: Every day | ORAL | 0 refills | Status: DC

## 2015-12-30 MED ORDER — PREGABALIN 150 MG PO CAPS: 150.0000 mg | ORAL_CAPSULE | Freq: Two times a day (BID) | ORAL | 1 refills | Status: DC

## 2015-12-30 NOTE — Patient Instructions (Addendum)
It was a pleasure to meet you today Jordan [name]  Start taking Cymbalta 30 mg daily  Continue taking lyrica 150 mg twice daily   Schedule an appointment to see your primary care doctor in 2 months

## 2015-12-30 NOTE — Assessment & Plan Note (Addendum)
She was diagnosed with fibromyalgia years ago. The fibromyalgia pain affects her legs, arms, hands, feet, shoulders, and hips. The pain is worse at night, she has alternating days where she has plenty of energy and then feels very tired the following day. She has been trying to exercise and was previously walking about 3 miles daily when her pain was under control however currently she has been able to walk 1 mile daily. She is taking Lyrica 150 mg twice daily for the pain. She has been on Cymbalta in the past but stopped taking it when lyrica controlled her pain. She feels that her pain is not under control today and that it is limiting her ability to exercise.   Refilled lyrica 150 mg BID  Started cymbalta 30 mg daily  Follow up with PCP in 2 months
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
## 2016-01-01 NOTE — Assessment & Plan Note (Signed)
Noticed that her urine has been cloudier than usual lately. Denies dysuria, frequency, hematuria or flank pain. On exam she is afebrile with no CVA tenderness or abdominal pain. Urine dipstick is negative for nitrites and leukocytes.   Increase fluid intake  Follow up with PCP in 2 months

## 2016-01-02 ENCOUNTER — Ambulatory Visit (HOSPITAL_COMMUNITY): Payer: Self-pay | Admitting: Clinical

## 2016-01-02 DIAGNOSIS — M545 Low back pain: Secondary | ICD-10-CM | POA: Diagnosis not present

## 2016-01-02 DIAGNOSIS — M5416 Radiculopathy, lumbar region: Secondary | ICD-10-CM | POA: Diagnosis not present

## 2016-01-02 LAB — POCT URINALYSIS DIPSTICK
Bilirubin, UA: NEGATIVE
Blood, UA: NEGATIVE
Glucose, UA: NEGATIVE
Ketones, UA: NEGATIVE
Leukocytes, UA: NEGATIVE
Nitrite, UA: NEGATIVE
Protein, UA: NEGATIVE
Spec Grav, UA: 1.015
Urobilinogen, UA: 1
pH, UA: 7.5

## 2016-01-03 NOTE — Progress Notes (Signed)
Internal Medicine Clinic Attending  I saw and evaluated the patient.  I personally confirmed the key portions of the history and exam documented by Dr. Blum and I reviewed pertinent patient test results.  The assessment, diagnosis, and plan were formulated together and I agree with the documentation in the resident's note. 

## 2016-01-05 ENCOUNTER — Encounter: Payer: Self-pay | Admitting: Internal Medicine

## 2016-01-05 ENCOUNTER — Ambulatory Visit: Payer: Self-pay

## 2016-01-09 ENCOUNTER — Ambulatory Visit (INDEPENDENT_AMBULATORY_CARE_PROVIDER_SITE_OTHER): Payer: Medicare Other | Admitting: Clinical

## 2016-01-09 DIAGNOSIS — F4310 Post-traumatic stress disorder, unspecified: Secondary | ICD-10-CM | POA: Diagnosis not present

## 2016-01-09 DIAGNOSIS — F3162 Bipolar disorder, current episode mixed, moderate: Secondary | ICD-10-CM
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
## 2016-01-10 DIAGNOSIS — M545 Low back pain: Secondary | ICD-10-CM | POA: Diagnosis not present

## 2016-01-16 ENCOUNTER — Encounter (HOSPITAL_COMMUNITY): Payer: Self-pay | Admitting: Clinical

## 2016-01-17 ENCOUNTER — Ambulatory Visit (INDEPENDENT_AMBULATORY_CARE_PROVIDER_SITE_OTHER): Payer: Medicare Other | Admitting: Psychiatry

## 2016-01-17 ENCOUNTER — Encounter (HOSPITAL_COMMUNITY): Payer: Self-pay | Admitting: Psychiatry

## 2016-01-17 DIAGNOSIS — Z79899 Other long term (current) drug therapy: Secondary | ICD-10-CM

## 2016-01-17 DIAGNOSIS — F3132 Bipolar disorder, current episode depressed, moderate: Secondary | ICD-10-CM

## 2016-01-17 MED ORDER — DIVALPROEX SODIUM ER 500 MG PO TB24: 500.0000 mg | ORAL_TABLET | Freq: Every day | ORAL | 2 refills | Status: DC
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
## 2016-01-20 DIAGNOSIS — M545 Low back pain: Secondary | ICD-10-CM | POA: Diagnosis not present

## 2016-01-23 ENCOUNTER — Telehealth: Payer: Self-pay

## 2016-01-23 NOTE — Telephone Encounter (Signed)
Kim from [facility] needs to speak with a nurse regarding Lyrica. Please call back.

## 2016-01-23 NOTE — Telephone Encounter (Signed)
It has been settled

## 2016-01-27 ENCOUNTER — Ambulatory Visit (INDEPENDENT_AMBULATORY_CARE_PROVIDER_SITE_OTHER): Payer: Medicare Other | Admitting: Internal Medicine

## 2016-01-27 ENCOUNTER — Encounter: Payer: Self-pay | Admitting: Internal Medicine

## 2016-01-27 VITALS — BP 104/54 | HR 70 | Temp 98.1°F | Wt 231.1 lb

## 2016-01-27 DIAGNOSIS — M542 Cervicalgia: Secondary | ICD-10-CM

## 2016-01-27 DIAGNOSIS — M797 Fibromyalgia: Secondary | ICD-10-CM

## 2016-01-27 DIAGNOSIS — I950 Idiopathic hypotension: Secondary | ICD-10-CM

## 2016-01-27 DIAGNOSIS — Z79899 Other long term (current) drug therapy: Secondary | ICD-10-CM | POA: Diagnosis not present

## 2016-01-27 MED ORDER — TRAMADOL HCL 50 MG PO TABS: 50.0000 mg | ORAL_TABLET | Freq: Four times a day (QID) | ORAL | 0 refills | Status: DC | PRN

## 2016-01-27 MED ORDER — PREGABALIN 150 MG PO CAPS: 150.0000 mg | ORAL_CAPSULE | Freq: Two times a day (BID) | ORAL | 3 refills | Status: DC
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
## 2016-01-28 DIAGNOSIS — S161XXA Strain of muscle, fascia and tendon at neck level, initial encounter: Secondary | ICD-10-CM | POA: Insufficient documentation

## 2016-01-28 NOTE — Assessment & Plan Note (Signed)
Reports fibromyalgia pain in her hands and feet is much improved with the addition of Cymbalta to her pain regimen.  Plan: - Continue Lyrica 150mg  BID and Cymbalta 30mg  daily
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
## 2016-01-28 NOTE — Assessment & Plan Note (Signed)
BP 104/54 today, denies any dizziness or orthostatic symptoms, is remaining well-hydrated

## 2016-01-30 NOTE — Progress Notes (Signed)
Internal Medicine Clinic Attending  I saw and evaluated the patient.  I personally confirmed the key portions of the history and exam documented by Dr. Johnson and I reviewed pertinent patient test results.  The assessment, diagnosis, and plan were formulated together and I agree with the documentation in the resident's note.  

## 2016-01-31 MED ORDER — PREGABALIN 150 MG PO CAPS: 150.0000 mg | ORAL_CAPSULE | Freq: Two times a day (BID) | ORAL | 3 refills | Status: DC

## 2016-01-31 NOTE — Addendum Note (Signed)
Addended by: Cordie Grice on: 01/31/2016 08:42 AM   Modules accepted: Orders

## 2016-02-06 ENCOUNTER — Ambulatory Visit (HOSPITAL_COMMUNITY)
Admission: RE | Admit: 2016-02-06 | Discharge: 2016-02-06 | Disposition: A | Discharge status: Home / Self Care | Payer: Medicare Other | Source: Ambulatory Visit | Attending: Internal Medicine | Admitting: Internal Medicine

## 2016-02-06 DIAGNOSIS — M50222 Other cervical disc displacement at C5-C6 level: Secondary | ICD-10-CM | POA: Diagnosis not present

## 2016-02-06 DIAGNOSIS — R937 Abnormal findings on diagnostic imaging of other parts of musculoskeletal system: Secondary | ICD-10-CM | POA: Diagnosis not present

## 2016-02-06 DIAGNOSIS — M50221 Other cervical disc displacement at C4-C5 level: Secondary | ICD-10-CM | POA: Insufficient documentation

## 2016-02-06 DIAGNOSIS — M542 Cervicalgia: Secondary | ICD-10-CM

## 2016-02-06 DIAGNOSIS — M4802 Spinal stenosis, cervical region: Secondary | ICD-10-CM | POA: Insufficient documentation

## 2016-02-06 DIAGNOSIS — S199XXA Unspecified injury of neck, initial encounter: Secondary | ICD-10-CM | POA: Diagnosis not present

## 2016-02-07 ENCOUNTER — Encounter: Payer: Self-pay | Admitting: Internal Medicine

## 2016-02-07 NOTE — Progress Notes (Deleted)
Not quite sure what to make of these results, do you think we need to follow up with an MRI? I will call her to let her know there is no fracture today.

## 2016-02-10 ENCOUNTER — Encounter: Payer: Self-pay | Admitting: Internal Medicine

## 2016-02-10 ENCOUNTER — Ambulatory Visit (INDEPENDENT_AMBULATORY_CARE_PROVIDER_SITE_OTHER): Payer: Medicare Other | Admitting: Internal Medicine

## 2016-02-10 VITALS — BP 101/54 | HR 63 | Temp 98.5°F | Ht 69.0 in | Wt 229.5 lb

## 2016-02-10 DIAGNOSIS — M4802 Spinal stenosis, cervical region: Secondary | ICD-10-CM | POA: Diagnosis not present

## 2016-02-10 DIAGNOSIS — M542 Cervicalgia: Secondary | ICD-10-CM

## 2016-02-10 DIAGNOSIS — M50221 Other cervical disc displacement at C4-C5 level: Secondary | ICD-10-CM

## 2016-02-10 DIAGNOSIS — R51 Headache: Secondary | ICD-10-CM | POA: Diagnosis not present

## 2016-02-10 MED ORDER — DULOXETINE HCL 30 MG PO CPEP: 30.0000 mg | ORAL_CAPSULE | Freq: Every day | ORAL | 2 refills | Status: DC

## 2016-02-10 MED ORDER — PREGABALIN 150 MG PO CAPS: 150.0000 mg | ORAL_CAPSULE | Freq: Two times a day (BID) | ORAL | 3 refills | Status: DC

## 2016-02-10 MED ORDER — KETOROLAC TROMETHAMINE 30 MG/ML IJ SOLN
60.0000 mg | Freq: Once | INTRAMUSCULAR | Status: AC
  Administered 2016-02-10: 60 mg via INTRAMUSCULAR

## 2016-02-10 MED ORDER — CYCLOBENZAPRINE HCL 5 MG PO TABS: 5.0000 mg | ORAL_TABLET | Freq: Every evening | ORAL | 0 refills | Status: DC | PRN

## 2016-02-10 NOTE — Assessment & Plan Note (Addendum)
Jordan [name] returns today reporting minimal improvement of her neck pain and headaches. We obtained CT neck since last visit that revealed no fractures but disc hernations from C4 to C7 with moderate stenosis at C5-C6 however she is not experiencing radiculopathy symptoms in her arms - full strength and sensation. She reports mild relief with Tramadol, Flexeril, and Ibuprofen and continues to experience episodes of tightness in her neck with simple activities (e.g. leaning over sink and washing dishes) that spread to muscles of her upper neck causing headaches and down to muscles in her upper back. This spreading tension precipitated a severe migraine-like headache last night (unlateral, throbbing, with photo/phonophobia, no N/V).   Plan: - Provided Ketorolac 60 mg IM for pain relief today - Provided referral for physical therapy - Advised to continue heat/cold packs, massages, and stretching exercises as tolerated - Provided safe dose limits for continued PRN Ibuprofen and Tylenol - Reassurance that her symptoms and exam are consistent with neck muscle strain and that it can take 6-8+ weeks to heal but will continue to improve with time

## 2016-02-10 NOTE — Patient Instructions (Addendum)
Please continue taking your medications as prescribed.  You can take Ibuprofen 800 mg up to three times daily for a few days.  You can take extra strength Tylenol - up to 3000 mg or 6 tablets daily as well.  We gave you a shot of Ketorolac (Toradol) today - a strong anti-inflammatory medicine that should help your pain.  We have also placed a referral to physical therapy.  Continue using heating or ice packs, stretching your neck muscles, or massaging the muscles.  Take care and enjoy the holidays!
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
## 2016-02-14 ENCOUNTER — Encounter: Payer: Self-pay | Admitting: Internal Medicine

## 2016-02-15 ENCOUNTER — Encounter: Payer: Self-pay | Admitting: Internal Medicine

## 2016-02-15 NOTE — Progress Notes (Signed)
Internal Medicine Clinic Attending  I saw and evaluated the patient.  I personally confirmed the key portions of the history and exam documented by Dr. Johnson and I reviewed pertinent patient test results.  The assessment, diagnosis, and plan were formulated together and I agree with the documentation in the resident's note.  

## 2016-02-20 ENCOUNTER — Encounter: Payer: Self-pay | Admitting: Internal Medicine

## 2016-02-20 DIAGNOSIS — F1120 Opioid dependence, uncomplicated: Secondary | ICD-10-CM | POA: Insufficient documentation

## 2016-02-24 ENCOUNTER — Other Ambulatory Visit: Payer: Self-pay | Admitting: *Deleted

## 2016-02-24 DIAGNOSIS — M542 Cervicalgia: Secondary | ICD-10-CM

## 2016-02-24 NOTE — Telephone Encounter (Signed)
A 4 month supply given in december

## 2016-02-27 ENCOUNTER — Encounter: Payer: Self-pay | Admitting: Physical Therapy

## 2016-02-27 ENCOUNTER — Ambulatory Visit: Payer: Medicare Other | Attending: Pediatrics | Admitting: Physical Therapy

## 2016-02-27 DIAGNOSIS — M62838 Other muscle spasm: Secondary | ICD-10-CM | POA: Diagnosis not present

## 2016-02-27 DIAGNOSIS — R293 Abnormal posture: Secondary | ICD-10-CM | POA: Diagnosis not present

## 2016-02-27 DIAGNOSIS — M542 Cervicalgia: Secondary | ICD-10-CM | POA: Insufficient documentation
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
## 2016-03-01 ENCOUNTER — Ambulatory Visit (INDEPENDENT_AMBULATORY_CARE_PROVIDER_SITE_OTHER): Payer: Medicare Other | Admitting: Internal Medicine

## 2016-03-01 DIAGNOSIS — R42 Dizziness and giddiness: Secondary | ICD-10-CM | POA: Diagnosis present

## 2016-03-01 DIAGNOSIS — H8110 Benign paroxysmal vertigo, unspecified ear: Secondary | ICD-10-CM | POA: Insufficient documentation

## 2016-03-01 HISTORY — DX: Benign paroxysmal vertigo, unspecified ear: H81.10

## 2016-03-01 NOTE — Assessment & Plan Note (Signed)
Went to PT and felt dizzy and lightheaded when looking up. A week prior she was looking up at home and passed out. Says that whenever she looks up she feels dizzy. Duration varies but lasts between 2-5 minutes. No nausea or vomiting. Does have blurry vision. Has been having severe right sided headaches associated with her neck pain. First noticed these symptoms 2 weeks ago when she passed out. Witnesses said that she was out for at least a minute.  No loss of bowel or bladder function. No radicular pain.  Went to PT and felt dizzy and lightheaded when looking up. A week prior she was looking up at home and passed out. Says that whenever she looks up she feels dizzy. Duration varies but lasts between 2-5 minutes. No nausea or vomiting. Does have blurry vision. Has been having severe right sided headaches associated with her neck pain. First noticed these symptoms 2 weeks ago when she passed out. Witnesses said that she was out for at least a minute.  No loss of bowel or bladder function. No radicular pain.  Symptoms worst when looking up and to the left.  Positive Dix-Hallpike exam on the left.  Plan Performed Epley maneuver with resolution of symptoms. Provided handout for performing maneuvers at home if symptoms returned. OK to resume PT
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
## 2016-03-01 NOTE — Patient Instructions (Signed)
Ms. Garciarodrigue,  You have what is called benign paroxsymal positional vertigo (BPPV). The maneuvers we did should help eliminate your symptoms. I am providing you with a handout to do this at home if your symptoms return. You can go back and work with PT again.

## 2016-03-02 NOTE — Progress Notes (Signed)
Internal Medicine Clinic Attending  I saw and evaluated the patient.  I personally confirmed the key portions of the history and exam documented by Dr. Charlynn Grimes and I reviewed pertinent patient test results.  The assessment, diagnosis, and plan were formulated together and I agree with the documentation in the resident's note.  I was able to provoke nystagmus and vertigo with a Marye Round with the left ear down. I think she has disease in her left posterior canal. We did an Epley maneuver which instantly cured her dizziness. She felt great when leaving the clinic. No vascular risk factors, very young age, so that makes vertebral artery disease less likely just based on the epidemiology. I would just watch for now, but I think BPPV is the reason for her dizziness upon head pitch, she can do repositioning maneuvers at home. If it progresses and syncope occurs more in the future, we can think about vascular imaging.

## 2016-03-05 ENCOUNTER — Ambulatory Visit (HOSPITAL_COMMUNITY): Payer: Self-pay | Admitting: Clinical

## 2016-03-08 ENCOUNTER — Ambulatory Visit: Payer: Medicare Other | Admitting: Physical Therapy

## 2016-03-13 ENCOUNTER — Ambulatory Visit: Payer: Medicare Other | Admitting: Physical Therapy

## 2016-03-13 ENCOUNTER — Encounter: Payer: Self-pay | Admitting: Physical Therapy

## 2016-03-13 DIAGNOSIS — M62838 Other muscle spasm: Secondary | ICD-10-CM

## 2016-03-13 DIAGNOSIS — M542 Cervicalgia: Secondary | ICD-10-CM | POA: Diagnosis not present

## 2016-03-13 DIAGNOSIS — R293 Abnormal posture: Secondary | ICD-10-CM | POA: Diagnosis not present
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
## 2016-03-15 ENCOUNTER — Ambulatory Visit: Payer: Medicare Other | Admitting: Physical Therapy

## 2016-03-15 ENCOUNTER — Encounter: Payer: Self-pay | Admitting: Physical Therapy

## 2016-03-15 DIAGNOSIS — M62838 Other muscle spasm: Secondary | ICD-10-CM | POA: Diagnosis not present

## 2016-03-15 DIAGNOSIS — M542 Cervicalgia: Secondary | ICD-10-CM

## 2016-03-15 DIAGNOSIS — R293 Abnormal posture: Secondary | ICD-10-CM | POA: Diagnosis not present
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
## 2016-03-20 ENCOUNTER — Ambulatory Visit: Payer: Medicare Other | Admitting: Physical Therapy

## 2016-03-20 DIAGNOSIS — M542 Cervicalgia: Secondary | ICD-10-CM

## 2016-03-20 DIAGNOSIS — R293 Abnormal posture: Secondary | ICD-10-CM | POA: Diagnosis not present

## 2016-03-20 DIAGNOSIS — M62838 Other muscle spasm: Secondary | ICD-10-CM

## 2016-03-20 NOTE — Patient Instructions (Signed)
Isometric neck issued from ex drawer, all issues 5 x 5 seconds 1-2 X a day, progressing to 10 X 10.  Painfree
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
## 2016-03-22 ENCOUNTER — Ambulatory Visit: Payer: Medicare Other | Attending: Pediatrics | Admitting: Physical Therapy

## 2016-03-22 ENCOUNTER — Encounter: Payer: Self-pay | Admitting: Physical Therapy

## 2016-03-22 DIAGNOSIS — R293 Abnormal posture: Secondary | ICD-10-CM | POA: Diagnosis not present

## 2016-03-22 DIAGNOSIS — M62838 Other muscle spasm: Secondary | ICD-10-CM | POA: Diagnosis not present

## 2016-03-22 DIAGNOSIS — M542 Cervicalgia: Secondary | ICD-10-CM | POA: Diagnosis not present
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
## 2016-03-29 ENCOUNTER — Ambulatory Visit: Payer: Medicare Other | Admitting: Physical Therapy

## 2016-03-29 DIAGNOSIS — M62838 Other muscle spasm: Secondary | ICD-10-CM

## 2016-03-29 DIAGNOSIS — M542 Cervicalgia: Secondary | ICD-10-CM | POA: Diagnosis not present

## 2016-03-29 DIAGNOSIS — R293 Abnormal posture: Secondary | ICD-10-CM | POA: Diagnosis not present

## 2016-03-29 NOTE — Therapy (Signed)
[place], [place], [address] Phone: [phone]   Fax:  [phone]  Physical Therapy Treatment  Patient Details  Name: Jordan [name] MRN: [id number] Date of Birth: 1980-11-11 Referring Provider: Dr Asencion Partridge   Encounter Date: 03/29/2016      PT End of Session - 03/29/16 1010    Visit Number 6   Number of Visits 16   Date for PT Re-Evaluation 04/23/16   Authorization Type Medicare    PT Start Time 0931   PT Stop Time 1000   PT Time Calculation (min) 29 min   Activity Tolerance Patient tolerated treatment well   Behavior During Therapy [facility] for tasks assessed/performed      Past Medical History:  Diagnosis Date  . Allergic rhinitis    pt takes flonase for episodes, no episodes in 2012  . Anxiety   . Basal cell carcinoma    recurrent in left maxillary, has had 5 surguries, last one 2003  . Candidiasis, vagina    recurrent, pt has made lifestyle modifications and none recently (as of 8/12)  . Cyst of nasal sinus   . Fibroma    left ovarian  . Fibromyalgia   . Gardnerella infection    + in 08/08  . GERD (gastroesophageal reflux disease)    occ  . Herniated disc   . OVARIAN CYSTECTOMY, HX OF 03/01/2006   ovarian fibroma-left 1996, L ovary and fallopian tube removed    . Paronychia of third finger of left hand 07/28/2010  . PLANTAR FASCIITIS, BILATERAL 05/24/2009  . PTSD (post-traumatic stress disorder)   . [place]low    hx of herpetic requiring I+D,( was bitten by autistic child that cares fr    Past Surgical History:  Procedure Laterality Date  . CHOLECYSTECTOMY    . LEFT OOPHORECTOMY    . LUMBAR LAMINECTOMY/DECOMPRESSION MICRODISCECTOMY N/A 08/14/2012   Procedure: LUMBAR LAMINECTOMY/DECOMPRESSION MICRODISCECTOMY Lumbar 5 -sacrum 1 decompression;  Surgeon: Sinclair Ship, MD;  Location: [place];  Service: Orthopedics;  Laterality: N/A;  Lumbar 5 -sacrum 1 decompression  . MANDIBLE  RECONSTRUCTION  92   upper anfd lower jaw cyst  . NASAL SINUS SURGERY     X 2 at age 67 & 70    There were no vitals filed for this visit.      Subjective Assessment - 03/29/16 0934    Subjective No pain but feels like her neck "gives out" at times. Weakness then spasm and pain begins.    Currently in Pain? No/denies            [facility] PT Assessment - 03/29/16 0939      AROM   AROM Assessment Site --  no pain with C AROM, just stretching    Cervical Flexion 60   Cervical Extension 45   Cervical - Right Side Bend 45   Cervical - Left Side Bend 38   Cervical - Right Rotation 70   Cervical - Left Rotation 68                     [facility] Adult PT Treatment/Exercise - 03/29/16 1006      Neck Exercises: Machines for Strengthening   UBE (Upper Arm Bike) 5 min level1 with corrected posture     Neck Exercises: Seated   Other Seated Exercise added in rotation today to each side x 5 each side      Manual Therapy   Manual therapy comments in  massage chair   Soft tissue mobilization upper traps, levator scap and suboccipitals bilaterally  trigger point release with deep pressure as tolerated   Myofascial Release upper neck and neck                 PT Education - 03/29/16 1009    Education provided Yes   Education Details knots" in neck may never fully go away but they will be less painful    Person(s) Educated Patient   Methods Explanation   Comprehension Verbalized understanding          PT Short Term Goals - 03/29/16 1012      PT SHORT TERM GOAL #1   Title Patient will increse rotation to the left by 20 degrees    Status Achieved     PT SHORT TERM GOAL #2   Title Patient will demonstrate 5/5 bilateral upper extremity strength    Status Unable to assess     PT SHORT TERM GOAL #3   Title Patient will report no pain radiating down into her right upper trap   Status Achieved     PT SHORT TERM GOAL #4   Title Patient will be independent with  inital stretching and strengthening program.    Status Achieved           PT Long Term Goals - 03/29/16 1012      PT LONG TERM GOAL #1   Title Patient will increase bilateral cervical rotation to 70 degrees without increased pain    Status Partially Met     PT LONG TERM GOAL #2   Title Patient will report no pain when sleeping at night    Status Unable to assess     PT LONG TERM GOAL #3   Title Patient will reach up into a cabinet with her right hand without having pain   Status Unable to assess     PT LONG TERM GOAL #4   Title FOTO improved from 49% disability to 34% disability to indicate significant functional improvement.    Status Unable to assess               Plan - 03/29/16 1010    Clinical Impression Statement Pt had to leave early today as she had another MD appt.  Knows her HEP (isometrics) and continues to improve.  AROM measured as pain free.    PT Next Visit Plan  isometrics,  continue supine scapular stabilization exercises.  manual as needed.    PT Home Exercise Plan upper trap stretch, scpaular retraction, shoulder extension, reviewed pain free cervical rotation,    Consulted and Agree with Plan of Care Patient      Patient will benefit from skilled therapeutic intervention in order to improve the following deficits and impairments:  Impaired sensation, Decreased endurance, Impaired UE functional use, Pain, Increased fascial restricitons, Improper body mechanics, Postural dysfunction  Visit Diagnosis: Cervicalgia  Other muscle spasm  Abnormal posture     Problem List Patient Active Problem List   Diagnosis Date Noted  . BPPV (benign paroxysmal positional vertigo) 03/01/2016  . Opioid dependence ([place]) 02/20/2016  . Neck muscle strain 01/28/2016  . Cloudy urine 12/30/2015  . Post traumatic stress disorder (PTSD) 11/09/2015  . Hereditary and idiopathic peripheral neuropathy 09/29/2015  . Bipolar disorder ([place]) 07/04/2015  . Vaginitis  05/06/2015  . Idiopathic hypotension 02/03/2015  . At risk for abnormal blood glucose level 12/15/2013  . Preventative health care 12/19/2011  . Fibromyalgia 06/26/2010  .  Insomnia 06/26/2010  . Chronic fatigue and depression 06/26/2010  . Obesity 01/17/2010    Class: Chronic  . Lumbar disc herniation of L5-S1 on the left side (MRI 2012 s/p surgery)  02/26/2008  . Allergic rhinitis 07/24/2006  . 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    PAA,JENNIFER 03/29/2016, 10:13 AM  [place], [place], [address] Phone: [phone]   Fax:  [phone]  Name: Jordan [name] MRN: [id number] Date of Birth: 1980-04-27  Raeford Razor, PT 03/29/16 10:15 AM Phone: [phone] Fax: [phone]

## 2016-04-02 ENCOUNTER — Telehealth: Payer: Self-pay

## 2016-04-02 NOTE — Telephone Encounter (Signed)
Jordan [name] from [facility] needs to speak with a nurse regarding lab. Please call back.

## 2016-04-02 NOTE — Telephone Encounter (Signed)
Jordan [name] was referred to dr arfeen for depakote labs and dosing Pt does need an appt sometime in feb per pcp

## 2016-04-03 ENCOUNTER — Ambulatory Visit: Payer: Medicare Other | Admitting: Physical Therapy

## 2016-04-03 DIAGNOSIS — M542 Cervicalgia: Secondary | ICD-10-CM

## 2016-04-03 DIAGNOSIS — M62838 Other muscle spasm: Secondary | ICD-10-CM

## 2016-04-03 DIAGNOSIS — R293 Abnormal posture: Secondary | ICD-10-CM

## 2016-04-03 NOTE — Therapy (Signed)
[place], [place], [address] Phone: [phone]   Fax:  [phone]  Physical Therapy Treatment  Patient Details  Name: Jordan [name] MRN: [id number] Date of Birth: 05/24/1980 Referring Provider: Dr Asencion Partridge   Encounter Date: 04/03/2016      PT End of Session - 04/03/16 1010    Visit Number 7   Number of Visits 16   Date for PT Re-Evaluation 04/23/16   PT Start Time 0924   PT Stop Time 1005   PT Time Calculation (min) 41 min   Activity Tolerance Patient tolerated treatment well   Behavior During Therapy [facility] for tasks assessed/performed      Past Medical History:  Diagnosis Date  . Allergic rhinitis    pt takes flonase for episodes, no episodes in 2012  . Anxiety   . Basal cell carcinoma    recurrent in left maxillary, has had 5 surguries, last one 2003  . Candidiasis, vagina    recurrent, pt has made lifestyle modifications and none recently (as of 8/12)  . Cyst of nasal sinus   . Fibroma    left ovarian  . Fibromyalgia   . Gardnerella infection    + in 08/08  . GERD (gastroesophageal reflux disease)    occ  . Herniated disc   . OVARIAN CYSTECTOMY, HX OF 03/01/2006   ovarian fibroma-left 1996, L ovary and fallopian tube removed    . Paronychia of third finger of left hand 07/28/2010  . PLANTAR FASCIITIS, BILATERAL 05/24/2009  . PTSD (post-traumatic stress disorder)   . [place]    hx of herpetic requiring I+D,( was bitten by autistic child that cares fr    Past Surgical History:  Procedure Laterality Date  . CHOLECYSTECTOMY    . LEFT OOPHORECTOMY    . LUMBAR LAMINECTOMY/DECOMPRESSION MICRODISCECTOMY N/A 08/14/2012   Procedure: LUMBAR LAMINECTOMY/DECOMPRESSION MICRODISCECTOMY Lumbar 5 -sacrum 1 decompression;  Surgeon: Sinclair Ship, MD;  Location: [place];  Service: Orthopedics;  Laterality: N/A;  Lumbar 5 -sacrum 1 decompression  . MANDIBLE RECONSTRUCTION  89   upper anfd  lower jaw cyst  . NASAL SINUS SURGERY     X 2 at age 47 & 16    There were no vitals filed for this visit.      Subjective Assessment - 04/03/16 0951    Subjective No pain.  now.  it returns at the end of the day.  Exercises are going well.  Able to reach into cabinets and return to cookoing.    Currently in Pain? No/denies   Pain Score --  up to 6/10   Pain Location Neck   Pain Orientation Right;Left   Pain Type Chronic pain   Pain Radiating Towards into right shoulder    Pain Frequency Intermittent   Aggravating Factors  driving, end of day   Pain Relieving Factors exercises, posture awareness, stretching,  travel pillow   Effect of Pain on Daily Activities driving difficult,  not back to upper body work at gym,  no heavy lifting                         [facility] Adult PT Treatment/Exercise - 04/03/16 0001      Neck Exercises: Supine   Other Supine Exercise over rolled towel: serratus punches bilateral 10 x and single 5 X, difficult,  smaller circles 10 x,  flexion/extension with slight stretch toward walls at end range,  intermittant cues  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

## 2016-04-05 ENCOUNTER — Ambulatory Visit: Payer: Medicare Other | Admitting: Physical Therapy

## 2016-04-05 ENCOUNTER — Encounter: Payer: Self-pay | Admitting: Internal Medicine

## 2016-04-05 DIAGNOSIS — M542 Cervicalgia: Secondary | ICD-10-CM

## 2016-04-05 DIAGNOSIS — R293 Abnormal posture: Secondary | ICD-10-CM

## 2016-04-05 DIAGNOSIS — M62838 Other muscle spasm: Secondary | ICD-10-CM | POA: Diagnosis not present

## 2016-04-05 NOTE — Therapy (Signed)
[place], [place], [address] Phone: [phone]   Fax:  [phone]  Physical Therapy Treatment  Patient Details  Name: Jordan [name] MRN: [id number] Date of Birth: October 29, 1980 Referring Provider: Dr Asencion Partridge   Encounter Date: 04/05/2016      PT End of Session - 04/05/16 0932    Visit Number 8   Number of Visits 16   Date for PT Re-Evaluation 04/23/16   Authorization Type Medicare    PT Start Time 0930   PT Stop Time 1025   PT Time Calculation (min) 55 min      Past Medical History:  Diagnosis Date  . Allergic rhinitis    pt takes flonase for episodes, no episodes in 2012  . Anxiety   . Basal cell carcinoma    recurrent in left maxillary, has had 5 surguries, last one 2003  . Candidiasis, vagina    recurrent, pt has made lifestyle modifications and none recently (as of 8/12)  . Cyst of nasal sinus   . Fibroma    left ovarian  . Fibromyalgia   . Gardnerella infection    + in 08/08  . GERD (gastroesophageal reflux disease)    occ  . Herniated disc   . OVARIAN CYSTECTOMY, HX OF 03/01/2006   ovarian fibroma-left 1996, L ovary and fallopian tube removed    . Paronychia of third finger of left hand 07/28/2010  . PLANTAR FASCIITIS, BILATERAL 05/24/2009  . PTSD (post-traumatic stress disorder)   . [place]    hx of herpetic requiring I+D,( was bitten by autistic child that cares fr    Past Surgical History:  Procedure Laterality Date  . CHOLECYSTECTOMY    . LEFT OOPHORECTOMY    . LUMBAR LAMINECTOMY/DECOMPRESSION MICRODISCECTOMY N/A 08/14/2012   Procedure: LUMBAR LAMINECTOMY/DECOMPRESSION MICRODISCECTOMY Lumbar 5 -sacrum 1 decompression;  Surgeon: Sinclair Ship, MD;  Location: [place];  Service: Orthopedics;  Laterality: N/A;  Lumbar 5 -sacrum 1 decompression  . MANDIBLE RECONSTRUCTION  89   upper anfd lower jaw cyst  . NASAL SINUS SURGERY     X 2 at age 78 & 59    There were no  vitals filed for this visit.      Subjective Assessment - 04/05/16 0931    Subjective Still get pain at the end of the day. 4/10 at most, feels like fatigue. My upper back muscles were sore after the exercises last visit.    Currently in Pain? No/denies                         [facility] Adult PT Treatment/Exercise - 04/05/16 0001      Neck Exercises: Machines for Strengthening   UBE (Upper Arm Bike) 3 min forward and back    Cybex Row 20# x 10 each hand position cues for posture    Other Machines for Strengthening Lat pull down 20# x 10     Neck Exercises: Standing   Other Standing Exercises protraction/retraction with hands on wall x 10    Other Standing Exercises Row Green band x15     Neck Exercises: Supine   Other Supine Exercise over rolled towel: serratus punches bilateral 10 x and single 5 X, difficult,  smaller circles 10 x,  flexion/extension with slight stretch toward walls at end range,  intermittant cues for rexaxing neck given,  cued to be pain free.      Shoulder Exercises: Supine  Other Supine Exercises supine scapular stabilization,  narrow grip, horizontal abduction(green), sash (red)  10 x each     Modalities   Modalities Moist Heat     Moist Heat Therapy   Number Minutes Moist Heat 15 Minutes   Moist Heat Location --  upper back                  PT Short Term Goals - 03/29/16 1012      PT SHORT TERM GOAL #1   Title Patient will increse rotation to the left by 20 degrees    Status Achieved     PT SHORT TERM GOAL #2   Title Patient will demonstrate 5/5 bilateral upper extremity strength    Status Unable to assess     PT SHORT TERM GOAL #3   Title Patient will report no pain radiating down into her right upper trap   Status Achieved     PT SHORT TERM GOAL #4   Title Patient will be independent with inital stretching and strengthening program.    Status Achieved           PT Long Term Goals - 04/03/16 0943      PT  LONG TERM GOAL #2   Title Patient will report no pain when sleeping at night    Baseline needsa a travel pillow to sleep with out pain   Time 8   Period Weeks   Status Partially Met     PT LONG TERM GOAL #3   Title Patient will reach up into a cabinet with her right hand without having pain   Baseline able to do    Time 8   Period Weeks   Status Achieved     PT LONG TERM GOAL #4   Title FOTO improved from 49% disability to 34% disability to indicate significant functional improvement.    Status Unable to assess               Plan - 04/05/16 0946    Clinical Impression Statement Pt is eager to improve her upper body strength. Trial of gym machines today and she did well. She reports soreness after last visit. No increased pain today she just feels the soreness. Answered questions about safe gym machines.   PT Next Visit Plan supine foam roller exercises over rolled towel,  consider quadriped or prone exercises   PT Home Exercise Plan upper trap stretch, scpaular retraction, shoulder extension, reviewed pain free cervical rotation,       Patient will benefit from skilled therapeutic intervention in order to improve the following deficits and impairments:  Impaired sensation, Decreased endurance, Impaired UE functional use, Pain, Increased fascial restricitons, Improper body mechanics, Postural dysfunction  Visit Diagnosis: Cervicalgia  Other muscle spasm  Abnormal posture     Problem List Patient Active Problem List   Diagnosis Date Noted  . BPPV (benign paroxysmal positional vertigo) 03/01/2016  . Opioid dependence ([place]) 02/20/2016  . Neck muscle strain 01/28/2016  . Cloudy urine 12/30/2015  . Post traumatic stress disorder (PTSD) 11/09/2015  . Hereditary and idiopathic peripheral neuropathy 09/29/2015  . Bipolar disorder ([place]) 07/04/2015  . Vaginitis 05/06/2015  . Idiopathic hypotension 02/03/2015  . At risk for abnormal blood glucose level 12/15/2013  .  Preventative health care 12/19/2011  . Fibromyalgia 06/26/2010  . Insomnia 06/26/2010  . Chronic fatigue and depression 06/26/2010  . Obesity 01/17/2010    Class: Chronic  . Lumbar disc herniation of L5-S1 on the  left side (MRI 2012 s/p surgery)  02/26/2008  . Allergic rhinitis 07/24/2006  . 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

## 2016-04-10 ENCOUNTER — Ambulatory Visit: Payer: Medicare Other | Admitting: Physical Therapy

## 2016-04-10 DIAGNOSIS — M542 Cervicalgia: Secondary | ICD-10-CM

## 2016-04-10 DIAGNOSIS — M62838 Other muscle spasm: Secondary | ICD-10-CM

## 2016-04-10 DIAGNOSIS — R293 Abnormal posture: Secondary | ICD-10-CM

## 2016-04-10 NOTE — Therapy (Signed)
[place], [place], [address] Phone: [phone]   Fax:  [phone]  Physical Therapy Treatment  Patient Details  Name: Jordan [name] MRN: [id number] Date of Birth: 1980-10-15 Referring Provider: Dr Asencion Partridge   Encounter Date: 04/10/2016      PT End of Session - 04/10/16 0930    Visit Number 9   Number of Visits 16   Date for PT Re-Evaluation 04/23/16   PT Start Time 0845   PT Stop Time 0928   PT Time Calculation (min) 43 min   Activity Tolerance Patient tolerated treatment well   Behavior During Therapy [facility] for tasks assessed/performed      Past Medical History:  Diagnosis Date  . Allergic rhinitis    pt takes flonase for episodes, no episodes in 2012  . Anxiety   . Basal cell carcinoma    recurrent in left maxillary, has had 5 surguries, last one 2003  . Candidiasis, vagina    recurrent, pt has made lifestyle modifications and none recently (as of 8/12)  . Cyst of nasal sinus   . Fibroma    left ovarian  . Fibromyalgia   . Gardnerella infection    + in 08/08  . GERD (gastroesophageal reflux disease)    occ  . Herniated disc   . OVARIAN CYSTECTOMY, HX OF 03/01/2006   ovarian fibroma-left 1996, L ovary and fallopian tube removed    . Paronychia of third finger of left hand 07/28/2010  . PLANTAR FASCIITIS, BILATERAL 05/24/2009  . PTSD (post-traumatic stress disorder)   . [place]    hx of herpetic requiring I+D,( was bitten by autistic child that cares fr    Past Surgical History:  Procedure Laterality Date  . CHOLECYSTECTOMY    . LEFT OOPHORECTOMY    . LUMBAR LAMINECTOMY/DECOMPRESSION MICRODISCECTOMY N/A 08/14/2012   Procedure: LUMBAR LAMINECTOMY/DECOMPRESSION MICRODISCECTOMY Lumbar 5 -sacrum 1 decompression;  Surgeon: Sinclair Ship, MD;  Location: [place];  Service: Orthopedics;  Laterality: N/A;  Lumbar 5 -sacrum 1 decompression  . MANDIBLE RECONSTRUCTION  92   upper anfd  lower jaw cyst  . NASAL SINUS SURGERY     X 2 at age 65 & 39    There were no vitals filed for this visit.      Subjective Assessment - 04/10/16 0850    Subjective Able to work out with a trainer yesterday for her upper body without pain.  She noticed some weakness.   Able to use 20 LBS 2 sets.  When I turn to look behind with driving I turn with my waist.  With less pain,  Less fatigue at end of the day.   Currently in Pain? No/denies   Pain Location Neck   Pain Orientation Right;Left   Pain Descriptors / Indicators --  neck gets tired,  head heavy   Pain Frequency Intermittent   Aggravating Factors  at night trying to wean from travcel pillow   Pain Relieving Factors exercises ,  sleeping with travel pillow   Effect of Pain on Daily Activities slipping intermittantly,  Light weights at the gym, driving difficult                         [facility] Adult PT Treatment/Exercise - 04/10/16 0001      Neck Exercises: Machines for Strengthening   UBE (Upper Arm Bike) 6 forward L2   Cybex Row 20# x 10 each hand position cues  [place], [place], [address] Phone: [phone]   Fax:  [phone]  Physical Therapy Treatment  Patient Details  Name: Jordan [name] MRN: [id number] Date of Birth: 1980-10-15 Referring Provider: Dr Asencion Partridge   Encounter Date: 04/10/2016      PT End of Session - 04/10/16 0930    Visit Number 9   Number of Visits 16   Date for PT Re-Evaluation 04/23/16   PT Start Time 0845   PT Stop Time 0928   PT Time Calculation (min) 43 min   Activity Tolerance Patient tolerated treatment well   Behavior During Therapy [facility] for tasks assessed/performed      Past Medical History:  Diagnosis Date  . Allergic rhinitis    pt takes flonase for episodes, no episodes in 2012  . Anxiety   . Basal cell carcinoma    recurrent in left maxillary, has had 5 surguries, last one 2003  . Candidiasis, vagina    recurrent, pt has made lifestyle modifications and none recently (as of 8/12)  . Cyst of nasal sinus   . Fibroma    left ovarian  . Fibromyalgia   . Gardnerella infection    + in 08/08  . GERD (gastroesophageal reflux disease)    occ  . Herniated disc   . OVARIAN CYSTECTOMY, HX OF 03/01/2006   ovarian fibroma-left 1996, L ovary and fallopian tube removed    . Paronychia of third finger of left hand 07/28/2010  . PLANTAR FASCIITIS, BILATERAL 05/24/2009  . PTSD (post-traumatic stress disorder)   . [place]    hx of herpetic requiring I+D,( was bitten by autistic child that cares fr    Past Surgical History:  Procedure Laterality Date  . CHOLECYSTECTOMY    . LEFT OOPHORECTOMY    . LUMBAR LAMINECTOMY/DECOMPRESSION MICRODISCECTOMY N/A 08/14/2012   Procedure: LUMBAR LAMINECTOMY/DECOMPRESSION MICRODISCECTOMY Lumbar 5 -sacrum 1 decompression;  Surgeon: Sinclair Ship, MD;  Location: [place];  Service: Orthopedics;  Laterality: N/A;  Lumbar 5 -sacrum 1 decompression  . MANDIBLE RECONSTRUCTION  89   upper anfd  lower jaw cyst  . NASAL SINUS SURGERY     X 2 at age 65 & 39    There were no vitals filed for this visit.      Subjective Assessment - 04/10/16 0850    Subjective Able to work out with a trainer yesterday for her upper body without pain.  She noticed some weakness.   Able to use 20 LBS 2 sets.  When I turn to look behind with driving I turn with my waist.  With less pain,  Less fatigue at end of the day.   Currently in Pain? No/denies   Pain Location Neck   Pain Orientation Right;Left   Pain Descriptors / Indicators --  neck gets tired,  head heavy   Pain Frequency Intermittent   Aggravating Factors  at night trying to wean from travcel pillow   Pain Relieving Factors exercises ,  sleeping with travel pillow   Effect of Pain on Daily Activities slipping intermittantly,  Light weights at the gym, driving difficult                         [facility] Adult PT Treatment/Exercise - 04/10/16 0001      Neck Exercises: Machines for Strengthening   UBE (Upper Arm Bike) 6 forward L2   Cybex Row 20# x 10 each hand position cues  of Care Patient      Patient will benefit from skilled therapeutic intervention in order to improve the following  deficits and impairments:  Impaired sensation, Decreased endurance, Impaired UE functional use, Pain, Increased fascial restricitons, Improper body mechanics, Postural dysfunction  Visit Diagnosis: Cervicalgia  Other muscle spasm  Abnormal posture     Problem List Patient Active Problem List   Diagnosis Date Noted  . BPPV (benign paroxysmal positional vertigo) 03/01/2016  . Opioid dependence ([place]) 02/20/2016  . Neck muscle strain 01/28/2016  . Cloudy urine 12/30/2015  . Post traumatic stress disorder (PTSD) 11/09/2015  . Hereditary and idiopathic peripheral neuropathy 09/29/2015  . Bipolar disorder ([place]) 07/04/2015  . Vaginitis 05/06/2015  . Idiopathic hypotension 02/03/2015  . At risk for abnormal blood glucose level 12/15/2013  . Preventative health care 12/19/2011  . Fibromyalgia 06/26/2010  . Insomnia 06/26/2010  . Chronic fatigue and depression 06/26/2010  . Obesity 01/17/2010    Class: Chronic  . Lumbar disc herniation of L5-S1 on the left side (MRI 2012 s/p surgery)  02/26/2008  . Allergic rhinitis 07/24/2006  . 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 [place], [place], [address] Phone: [phone]   Fax:  [phone]  Name: Jordan [name] MRN: [id number] Date of Birth: Jul 28, 1980

## 2016-04-12 ENCOUNTER — Encounter: Payer: Self-pay | Admitting: Physical Therapy

## 2016-04-12 ENCOUNTER — Ambulatory Visit: Payer: Medicare Other | Admitting: Physical Therapy

## 2016-04-12 DIAGNOSIS — R293 Abnormal posture: Secondary | ICD-10-CM | POA: Diagnosis not present

## 2016-04-12 DIAGNOSIS — M542 Cervicalgia: Secondary | ICD-10-CM | POA: Diagnosis not present

## 2016-04-12 DIAGNOSIS — M62838 Other muscle spasm: Secondary | ICD-10-CM

## 2016-04-13 ENCOUNTER — Encounter: Payer: Self-pay | Admitting: Internal Medicine
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
## 2016-04-16 ENCOUNTER — Ambulatory Visit (HOSPITAL_COMMUNITY): Payer: Self-pay | Admitting: Clinical

## 2016-04-17 ENCOUNTER — Ambulatory Visit: Payer: Medicare Other | Admitting: Physical Therapy

## 2016-04-18 ENCOUNTER — Ambulatory Visit (INDEPENDENT_AMBULATORY_CARE_PROVIDER_SITE_OTHER): Payer: Medicare Other | Admitting: Psychiatry

## 2016-04-18 ENCOUNTER — Encounter (HOSPITAL_COMMUNITY): Payer: Self-pay | Admitting: Psychiatry

## 2016-04-18 VITALS — BP 118/72 | HR 64 | Ht 69.0 in | Wt 236.2 lb

## 2016-04-18 DIAGNOSIS — Z818 Family history of other mental and behavioral disorders: Secondary | ICD-10-CM

## 2016-04-18 DIAGNOSIS — F419 Anxiety disorder, unspecified: Secondary | ICD-10-CM | POA: Diagnosis not present

## 2016-04-18 DIAGNOSIS — F3132 Bipolar disorder, current episode depressed, moderate: Secondary | ICD-10-CM

## 2016-04-18 DIAGNOSIS — Z813 Family history of other psychoactive substance abuse and dependence: Secondary | ICD-10-CM

## 2016-04-18 DIAGNOSIS — Z79899 Other long term (current) drug therapy: Secondary | ICD-10-CM

## 2016-04-18 MED ORDER — DIVALPROEX SODIUM ER 500 MG PO TB24: 500.0000 mg | ORAL_TABLET | Freq: Every day | ORAL | 2 refills | Status: DC

## 2016-04-18 MED ORDER — HYDROXYZINE PAMOATE 25 MG PO CAPS: ORAL_CAPSULE | ORAL | 1 refills | Status: DC
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
## 2016-04-19 ENCOUNTER — Ambulatory Visit: Payer: Medicare Other | Admitting: Physical Therapy

## 2016-04-20 ENCOUNTER — Ambulatory Visit (INDEPENDENT_AMBULATORY_CARE_PROVIDER_SITE_OTHER): Payer: Medicare Other | Admitting: Internal Medicine

## 2016-04-20 ENCOUNTER — Encounter: Payer: Self-pay | Admitting: Internal Medicine

## 2016-04-20 VITALS — BP 99/58 | HR 76 | Temp 98.2°F | Wt 238.3 lb

## 2016-04-20 DIAGNOSIS — Z79891 Long term (current) use of opiate analgesic: Secondary | ICD-10-CM | POA: Diagnosis not present

## 2016-04-20 DIAGNOSIS — M797 Fibromyalgia: Secondary | ICD-10-CM

## 2016-04-20 DIAGNOSIS — X58XXXD Exposure to other specified factors, subsequent encounter: Secondary | ICD-10-CM | POA: Diagnosis not present

## 2016-04-20 DIAGNOSIS — S161XXD Strain of muscle, fascia and tendon at neck level, subsequent encounter: Secondary | ICD-10-CM

## 2016-04-20 DIAGNOSIS — F1120 Opioid dependence, uncomplicated: Secondary | ICD-10-CM

## 2016-04-20 MED ORDER — TRAMADOL HCL 50 MG PO TABS: 50.0000 mg | ORAL_TABLET | Freq: Four times a day (QID) | ORAL | 0 refills | Status: DC | PRN

## 2016-04-20 NOTE — Assessment & Plan Note (Signed)
Jordan [name] requires intermittent Tramadol 50 mg for flares of her fibromyalgia that do not respond to typical OTC medications, maybe 1-2 tablets weekly at most. Her previous prescription for 30 tablets was filled on 01/31/16 and has lasted until this visit. Confirmed on [place] controlled substance database.  Plan: - Refilled prescription for Tramadol 50 mg, #30 tablets

## 2016-04-20 NOTE — Assessment & Plan Note (Signed)
Jordan [name] reports near-complete resolution of her neck pain and stiffness, which she largely attributes to physical therapy (currently session 10 of 16). She is very pleased with her progress and glad that the pain has improved. She has full neck range of motion, no tense musculature, no neck pain to palpation on exam today.   Plan: - Continue physical therapy and PRN ibuprofen and Tylenol, as previous - Encouraged continued activity, exercise, stretching

## 2016-04-20 NOTE — Assessment & Plan Note (Signed)
Reports her fibromyalgia pain has greatly improved from a few months ago. She feels that her increased exercise and physical activity with PT sessions has improved her symptoms. She met with her psychiatrist recently and endorse some increased irritability and mood swings over the past few months, she feels since starting Cymbalta in November. Her psychiatric provider felt that this may be the culprit. Fibromyalgia appears well controlled currently, we will taper and discontinue Cymbalta. Jordan [name] also is requesting a refill on her tramadol, which she requires once or twice a week as needed for flares of her fibromyalgia that do not improve with typical analgesics. Her last prescription for 30 tablets was filled in early December 2017.  Plan: - Cymbalta 30 mg every other day for 1 week, then discontinue. - Continue Lyrica 150 mg twice a day - Refilled tramadol 50 mg, #30 tablets
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
## 2016-04-20 NOTE — Patient Instructions (Addendum)
Thank you for visiting [place] today!  We are glad that you are feeling so much better and that your neck pain has improved.  Keep up the great work and remain active / exercise regularly!  We can gradually stop the Cymbalta medication to see if this improves your irritability and mood swings. Please take it every other day for the next week then stop completely.  If your irritability/mood swings seem to continue well after stopping the Cymbalta - let us know.  Please continue to take your other medications as prescribed.

## 2016-04-23 NOTE — Progress Notes (Signed)
Medicine attending: Medical history, presenting problems, physical findings, and medications, reviewed with resident physician Dr Adam Johnson on the day of the patient visit and I concur with his evaluation and management plan. 

## 2016-04-24 ENCOUNTER — Ambulatory Visit: Payer: Medicare Other | Admitting: Physical Therapy

## 2016-04-25 ENCOUNTER — Ambulatory Visit (HOSPITAL_COMMUNITY): Payer: Self-pay | Admitting: Clinical

## 2016-04-30 ENCOUNTER — Other Ambulatory Visit (HOSPITAL_COMMUNITY): Payer: Self-pay

## 2016-04-30 DIAGNOSIS — F3132 Bipolar disorder, current episode depressed, moderate: Secondary | ICD-10-CM

## 2016-04-30 MED ORDER — HYDROXYZINE HCL 25 MG PO TABS: ORAL_TABLET | ORAL | 1 refills | Status: DC

## 2016-05-03 ENCOUNTER — Encounter (HOSPITAL_COMMUNITY): Payer: Self-pay | Admitting: Clinical

## 2016-05-03 ENCOUNTER — Ambulatory Visit (INDEPENDENT_AMBULATORY_CARE_PROVIDER_SITE_OTHER): Payer: Medicare Other | Admitting: Clinical

## 2016-05-03 DIAGNOSIS — F3162 Bipolar disorder, current episode mixed, moderate: Secondary | ICD-10-CM
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
## 2016-05-10 ENCOUNTER — Encounter: Payer: Self-pay | Admitting: Internal Medicine

## 2016-05-12 ENCOUNTER — Encounter: Payer: Self-pay | Admitting: Internal Medicine

## 2016-05-14 DIAGNOSIS — M545 Low back pain: Secondary | ICD-10-CM | POA: Diagnosis not present

## 2016-05-18 ENCOUNTER — Other Ambulatory Visit: Payer: Self-pay | Admitting: Internal Medicine

## 2016-05-22 ENCOUNTER — Encounter (HOSPITAL_COMMUNITY): Payer: Self-pay | Admitting: Clinical

## 2016-05-22 ENCOUNTER — Ambulatory Visit (INDEPENDENT_AMBULATORY_CARE_PROVIDER_SITE_OTHER): Payer: Medicare Other | Admitting: Clinical

## 2016-05-22 DIAGNOSIS — F3162 Bipolar disorder, current episode mixed, moderate: Secondary | ICD-10-CM | POA: Diagnosis not present

## 2016-05-22 NOTE — Progress Notes (Signed)
   THERAPIST PROGRESS NOTE  Session Time: 8:08 - 8:58  Participation Level: Active  Behavioral Response: CasualAlertAnxious and Depressed  Type of Therapy: Individual Therapy  Treatment Goals addressed: improve psychiatric symptoms, controlled behavior, moderate mood, and deliberate speech (improved social and work functioning and decrease impulsive behavior), improve unhelpful thought patterns, , interpersonal relationship skills,   healthy coping skills  Interventions: Motivational Interviewing, CBT, Grounding and Mindfulness Techniques   Summary: VONETTE GROSSO is a 36 y.o. female who presents with Moderate mixed bipolar I disorder and PTSD  Suicidal/Homicidal: No -without intent/plan  Therapist Response:  Sahalie met with clinician for an individual session. Kynesha discussed her psychiatric symptoms, her current life events and her homework. Symphoni shared that she has been having some difficulty with her emotions. She shared about some confusing relationship issues and some anger. Clinician asked open ended questions and Laiana shared about the events that triggered her negative thoughts and emotions and her reaction to them. She identified the evidence for and against her thoughts, and then was able to formulate healthier alternative thoughts. Client and clinician discussed strategies for helping moderate her mood. Client and clinician discussed healthy ways to communicate her needs and boundaries with others.  Clinician asked open ended questions about times she has successfully done so in the past. She identified some and the skills she used. She shared her thoughts and insights about the process. Aziza shared that she felt better about being able to do so. Clinician taught Jackquline a new grounding technique which was practiced together  Plan: Return again in 2 week.  Diagnosis: Axis I: Moderate mixed bipolar I disorder and PTSD  Yamilette Garretson A, LCSW 05/22/2016

## 2016-05-23 ENCOUNTER — Encounter: Payer: Self-pay | Admitting: Physical Therapy

## 2016-05-29 ENCOUNTER — Ambulatory Visit (INDEPENDENT_AMBULATORY_CARE_PROVIDER_SITE_OTHER): Payer: Medicare Other | Admitting: Clinical

## 2016-05-29 ENCOUNTER — Encounter (HOSPITAL_COMMUNITY): Payer: Self-pay | Admitting: Clinical

## 2016-05-29 DIAGNOSIS — F3162 Bipolar disorder, current episode mixed, moderate: Secondary | ICD-10-CM

## 2016-05-29 NOTE — Progress Notes (Addendum)
   THERAPIST PROGRESS NOTE  Session Time: 7:04 -8:00  Participation Level: Active  Behavioral Response: CasualAlertDepressed  Type of Therapy: Individual Therapy  Treatment Goals addressed: improve psychiatric symptoms, controlled behavior, moderate mood, and deliberate speech, improve unhelpful thought patterns,interpersonal relationship skills,healthy coping skills  Interventions: Motivational Interviewing, CBT,   Summary: Jordan [name] is [name] 36 y.o. female who presents with Moderate mixed bipolar I disorder   Suicidal/Homicidal: No -without intent/plan  Therapist Response:  Jordan [name] met with clinician for an individual session. Jordan [name] discussed her psychiatric symptoms, her current life events and her homework. Jordan [name] shared that she was feeling depressed this past week. Clinician asked open ended questions and Jordan [name] shared that she was having some relationship issues. She identified some of the challenging thoughts and emotions she has been experiencing. She shared she was having difiiculty finding healthier alternative thoughts. Clinician asked open ended questions and Jordan [name] shared her situation, her negative thoughts. She then identified the evidence for and against the thoughts. Clinician asked clarifying questions and Jordan [name] expanded on her answers. She was then able to formulate healthier alternative thoughts. Jordan [name] and clinician discussed how her approach to her partner might change if she believed the healthier alternative thoughts. Client and clinician discussed healthy interpersonal relationship skills for approaching difficult topics.  Plan: Return again in 2 week.  Diagnosis: Axis I: Moderate mixed bipolar I disorder     Jordan Bosque A, LCSW 05/29/2016

## 2016-06-05 ENCOUNTER — Ambulatory Visit (HOSPITAL_COMMUNITY): Payer: Self-pay | Admitting: Clinical

## 2016-06-12 ENCOUNTER — Encounter (HOSPITAL_COMMUNITY): Payer: Self-pay | Admitting: Clinical

## 2016-06-12 ENCOUNTER — Ambulatory Visit (INDEPENDENT_AMBULATORY_CARE_PROVIDER_SITE_OTHER): Payer: Medicare Other | Admitting: Clinical

## 2016-06-12 DIAGNOSIS — F3162 Bipolar disorder, current episode mixed, moderate: Secondary | ICD-10-CM | POA: Diagnosis not present

## 2016-06-12 NOTE — Progress Notes (Signed)
   THERAPIST PROGRESS NOTE  Session Time: 7:18 - 8:05  Participation Level: Active  Behavioral Response: CasualAlertAnxious and Depressed  Type of Therapy: Individual Therapy  Treatment Goals addressed: improve psychiatric symptoms, improve unhelpful thought patterns, elevate mood,  learn about diagnosis, healthy coping skills  Interventions: Motivational Interviewing, CBT, Grounding  Techniques   Summary: Jordan [name] is a 36 y.o. female who presents with Moderate mixed bipolar I disorder   Suicidal/Homicidal: No -without intent/plan  Therapist Response:  [name] met with clinician for an individual session. [name] discussed her psychiatric symptoms, her current life events and her homework. [name] shared that she has been feeling depressed lately. She shared that she has had "some rough days"  And has been having difficulty getting out of bed. Client and clinician discussed the symptoms of depression. Clinician asked open ended questions and [name] shared about her current stressors. She shared that her Uncle passed, She also shared about relationship challenges. Clinician asked open ended questions and [name] shared that she has been feeling emotionally numb. Clinician asked open ended questions and [name] identified the feelings she was wanting to avoid feeling. Clinician asked open ended question and [name] explored her thoughts about feeling those emotions. She then examined the evidence for and against the negative thoughts about feeling those feelings. [name] had the insight that her fear of being the emotions has been in the past worse than the emotion. She shared that this felt better to her. Clinician taught her a new grounding technique. Client and clinician practiced the technique together.    Plan: Return again in 1- 2 week.  Diagnosis: Axis I: Moderate mixed bipolar I disorder      Jayleen Afonso A, LCSW 06/12/2016

## 2016-06-19 ENCOUNTER — Ambulatory Visit (INDEPENDENT_AMBULATORY_CARE_PROVIDER_SITE_OTHER): Payer: Medicare Other | Admitting: Clinical

## 2016-06-19 ENCOUNTER — Encounter (HOSPITAL_COMMUNITY): Payer: Self-pay | Admitting: Clinical

## 2016-06-19 DIAGNOSIS — F3162 Bipolar disorder, current episode mixed, moderate: Secondary | ICD-10-CM

## 2016-06-19 NOTE — Progress Notes (Signed)
   THERAPIST PROGRESS NOTE  Session Time: 7:12 - 8:00  Participation Level: Active  Behavioral Response: CasualAlertDepressed  Type of Therapy: Individual Therapy  Treatment Goals addressed: improve psychiatric symptoms, controlled behavior, moderate mood, and deliberate speech, improve unhelpful thought patterns, elevate mood, interpersonal relationship skills,   healthy coping skills  Interventions: Motivational Interviewing,   Summary: ALAYLAH HEATHERINGTON is a 36 y.o. female who presents with Moderate mixed bipolar I disorder   Suicidal/Homicidal: No -without intent/plan  Therapist Response:  Shalika met with clinician for an individual session. Tamber discussed her psychiatric symptoms, her current life events and her homework. Marilea shared that she has been struggling with depression and last session. Patient shared that she had decided to go to her uncle's funeral which will be this upcoming Friday. Clinician asked open ended questions and Casimira identified healthy coping skills for dealing with the stress of the funeral and family interactions. She shared that he is having some relationship issues which are increasing her depression. Clinician asked open ended questions and Aritzel shared her feelings a bout speaking up and withholding. Client and clinician discussed healthy forms of communication versus unhealthy forms of communication. Clinician asked open ended questions and Kiylee identify aid some healthy coping skills that she could use to help elevate her mood. Client and clinician reviewed Lalita's homework on self compassion (to improve mood). She shared that she had been practicing her breathing techniques and has been using guided meditation to help regulate her emotions.  Plan: Return again in 2 week.  Diagnosis: Axis I: Moderate mixed bipolar I disorder    Koah Chisenhall A, LCSW 06/19/2016

## 2016-06-21 ENCOUNTER — Ambulatory Visit (INDEPENDENT_AMBULATORY_CARE_PROVIDER_SITE_OTHER): Payer: Medicare Other | Admitting: Psychiatry

## 2016-06-21 ENCOUNTER — Encounter (HOSPITAL_COMMUNITY): Payer: Self-pay | Admitting: Psychiatry

## 2016-06-21 VITALS — BP 122/76 | HR 68 | Ht 69.0 in | Wt 246.0 lb

## 2016-06-21 DIAGNOSIS — Z818 Family history of other mental and behavioral disorders: Secondary | ICD-10-CM

## 2016-06-21 DIAGNOSIS — Z79899 Other long term (current) drug therapy: Secondary | ICD-10-CM

## 2016-06-21 DIAGNOSIS — F419 Anxiety disorder, unspecified: Secondary | ICD-10-CM | POA: Diagnosis not present

## 2016-06-21 DIAGNOSIS — F3132 Bipolar disorder, current episode depressed, moderate: Secondary | ICD-10-CM

## 2016-06-21 MED ORDER — DIVALPROEX SODIUM ER 500 MG PO TB24: 500.0000 mg | ORAL_TABLET | Freq: Every day | ORAL | 2 refills | Status: DC

## 2016-06-21 MED ORDER — HYDROXYZINE HCL 25 MG PO TABS: ORAL_TABLET | ORAL | 1 refills | Status: DC
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
## 2016-06-26 ENCOUNTER — Ambulatory Visit (HOSPITAL_COMMUNITY): Payer: Self-pay | Admitting: Clinical

## 2016-07-03 ENCOUNTER — Ambulatory Visit (INDEPENDENT_AMBULATORY_CARE_PROVIDER_SITE_OTHER): Payer: Medicare Other | Admitting: Clinical

## 2016-07-03 ENCOUNTER — Encounter (HOSPITAL_COMMUNITY): Payer: Self-pay | Admitting: Clinical

## 2016-07-03 DIAGNOSIS — F3162 Bipolar disorder, current episode mixed, moderate: Secondary | ICD-10-CM

## 2016-07-03 NOTE — Progress Notes (Signed)
   THERAPIST PROGRESS NOTE  Session Time: 7:03 - 8:00  Participation Level: Active  Behavioral Response: CasualAlertAnxious  Type of Therapy: Individual Therapy  Treatment Goals addressed: improve psychiatric symptoms, controlled behavior, moderate mood, and deliberate speech,  elevate mood, interpersonal relationship skills,  learn about diagnosis, healthy coping skills  Interventions: Motivational Interviewing, CBT, psychoeducation  Summary: Felisia Y Alfonzo is a 36 y.o. female who presents with Moderate mixed bipolar I disorder   Suicidal/Homicidal: No -without intent/plan  Therapist Response:  Habiba met with clinician for an individual session. Farra discussed her psychiatric symptoms, her current life events and her homework. Clinician asked open ended questions and Myana shared that she has been challenged with some anxiety and depression since last session. She shared that her partner is in her assessment possibly bipolar and not willing to get treated. Clinician asked open ended questions and Petra shared her thoughts and insights about her own symptoms. She shared that she ran out of medication for a few days and recognized the difference in herself. Client and clinician discussed the importance of taking her medication. Naquita shared that she completed her homework. Client and clinician reviewed and discussed her homework. Clinician asked open ended questions and Ashika shared her insight about the importance of self care regardless of others process. Client and clinician discussed healthy interpersonal relationship skills.    Plan: Return again in 2 week.  Diagnosis: Axis I: Moderate mixed bipolar I disorder      , A, LCSW 07/03/2016  

## 2016-07-10 ENCOUNTER — Ambulatory Visit (INDEPENDENT_AMBULATORY_CARE_PROVIDER_SITE_OTHER): Payer: Medicare Other | Admitting: Clinical

## 2016-07-10 ENCOUNTER — Encounter (HOSPITAL_COMMUNITY): Payer: Self-pay | Admitting: Clinical

## 2016-07-10 DIAGNOSIS — F3162 Bipolar disorder, current episode mixed, moderate: Secondary | ICD-10-CM | POA: Diagnosis not present

## 2016-07-10 NOTE — Progress Notes (Signed)
   THERAPIST PROGRESS NOTE  Session Time: 7:12 - 8:00  Participation Level: Active  Behavioral Response: CasualAlertAnxious and Depressed  Type of Therapy: Individual Therapy  Treatment Goals addressed: improve psychiatric symptoms, controlled behavior, moderate mood, and deliberate speech, improve unhelpful thought patterns, elevate mood, interpersonal relationship skills,  healthy coping skills  Interventions: Motivational Interviewing, CBT,   Summary: Jordan [name] is a 36 y.o. female who presents with Moderate mixed bipolar I disorder   Suicidal/Homicidal: No -without intent/plan  Therapist Response:  [name] met with clinician for an individual session. [name] discussed her psychiatric symptoms, her current life events and her homework. [name] shared that she has been struggling with her mood lately and found that her mind was wondering alot. Clinician asked open ended questions and [name] shared that she was struggling in her significant relationship and believed the mind wondering had to do with wanting to avoid conflict. She shared that she was with holding her thoughts and emotions because she did not want to fall in to old patterns of anger. Morning shared some of her negative thoughts about the motivation of the other. Client and clinician discussed the evidence for and against the negative automatic thoughts. [name] was then able to tap into her empathy for the other. Clinician askd open ended questions about what she wanted to share with her partner. She identified her thoughts. Clinician asked open ended questions about how she could share that in a way that it could be received. [name] and clinician discussed her past experience of holding in the things that were important to her. She had the insight that she would be likely fall into old anger patterns if she held it in rather than express her needs or concerns.  Plan: Return again in 2 week.  Diagnosis: Axis I: Moderate mixed  bipolar I disorder      [name] A, LCSW 07/10/2016

## 2016-07-17 ENCOUNTER — Ambulatory Visit: Payer: Medicare Other | Admitting: Diagnostic Neuroimaging

## 2016-07-17 ENCOUNTER — Ambulatory Visit (HOSPITAL_COMMUNITY): Payer: Self-pay | Admitting: Clinical

## 2016-07-18 ENCOUNTER — Encounter: Payer: Self-pay | Admitting: Diagnostic Neuroimaging

## 2016-07-19 ENCOUNTER — Other Ambulatory Visit: Payer: Self-pay | Admitting: Internal Medicine

## 2016-07-19 DIAGNOSIS — M797 Fibromyalgia: Secondary | ICD-10-CM

## 2016-07-19 DIAGNOSIS — S161XXD Strain of muscle, fascia and tendon at neck level, subsequent encounter: Secondary | ICD-10-CM

## 2016-07-19 DIAGNOSIS — M542 Cervicalgia: Secondary | ICD-10-CM

## 2016-07-20 ENCOUNTER — Ambulatory Visit (INDEPENDENT_AMBULATORY_CARE_PROVIDER_SITE_OTHER): Payer: Medicare Other | Admitting: Diagnostic Neuroimaging

## 2016-07-20 ENCOUNTER — Encounter: Payer: Self-pay | Admitting: Diagnostic Neuroimaging

## 2016-07-20 VITALS — BP 101/70 | HR 72 | Ht 69.0 in | Wt 245.0 lb

## 2016-07-20 DIAGNOSIS — R6889 Other general symptoms and signs: Secondary | ICD-10-CM | POA: Diagnosis not present

## 2016-07-20 DIAGNOSIS — R413 Other amnesia: Secondary | ICD-10-CM

## 2016-07-20 DIAGNOSIS — Z79899 Other long term (current) drug therapy: Secondary | ICD-10-CM | POA: Diagnosis not present

## 2016-07-20 NOTE — Patient Instructions (Signed)
Thank you for coming to see [place] at [facility]. I hope we have been able to provide you high quality care today.  You may receive a patient satisfaction survey over the next few weeks. We would appreciate your feedback and comments so that we may continue to improve ourselves and the health of our patients.  - I will check MRI and labs   ~~~~~~~~~~~~~~~~~~~~~~~~~~~~~~~~~~~~~~~~~~~~~~~~~~~~~~~~~~~~~~~~~  DR. Glenna Brunkow'S GUIDE TO HAPPY AND HEALTHY LIVING These are some of my general health and wellness recommendations. Some of them may apply to you better than others. Please use common sense as you try these suggestions and feel free to ask me any questions.   ACTIVITY/FITNESS Mental, social, emotional and physical stimulation are very important for brain and body health. Try learning a new activity (arts, music, language, sports, games).  Keep moving your body to the best of your abilities. You can do this at home, inside or outside, the park, community center, gym or anywhere you like. Consider a physical therapist or personal trainer to get started. Consider the app Sworkit. Fitness trackers such as smart-watches, smart-phones or Fitbits can help as well.   NUTRITION Eat more plants: colorful vegetables, nuts, seeds and berries.  Eat less sugar, salt, preservatives and processed foods.  Avoid toxins such as cigarettes and alcohol.  Drink water when you are thirsty. Warm water with a slice of lemon is an excellent morning drink to start the day.  Consider these websites for more information The Nutrition Source (https://www.henry-hernandez.biz/) Precision Nutrition (WindowBlog.ch)   RELAXATION Consider practicing mindfulness meditation or other relaxation techniques such as deep breathing, prayer, yoga, tai chi, massage. See website mindful.org or the apps Headspace or Calm to help get started.   SLEEP Try to get at  least 7-8+ hours sleep per day. Regular exercise and reduced caffeine will help you sleep better. Practice good sleep hygeine techniques. See website sleep.org for more information.   PLANNING Prepare estate planning, living will, healthcare POA documents. Sometimes this is best planned with the help of an attorney. Theconversationproject.org and agingwithdignity.org are excellent resources.
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
## 2016-07-21 LAB — TSH: TSH: 2.24 u[IU]/mL (ref 0.450–4.500)

## 2016-07-21 LAB — VITAMIN B12: Vitamin B-12: 666 pg/mL (ref 232–1245)

## 2016-07-23 MED ORDER — CYCLOBENZAPRINE HCL 5 MG PO TABS: 5.0000 mg | ORAL_TABLET | Freq: Every evening | ORAL | 0 refills | Status: DC | PRN

## 2016-07-24 ENCOUNTER — Ambulatory Visit (INDEPENDENT_AMBULATORY_CARE_PROVIDER_SITE_OTHER): Payer: Medicare Other | Admitting: Clinical

## 2016-07-24 ENCOUNTER — Ambulatory Visit (INDEPENDENT_AMBULATORY_CARE_PROVIDER_SITE_OTHER): Payer: Medicare Other | Admitting: Internal Medicine

## 2016-07-24 ENCOUNTER — Encounter (HOSPITAL_COMMUNITY): Payer: Self-pay | Admitting: Clinical

## 2016-07-24 ENCOUNTER — Telehealth: Payer: Self-pay | Admitting: *Deleted

## 2016-07-24 VITALS — BP 106/51 | HR 67 | Temp 98.4°F | Wt 246.2 lb

## 2016-07-24 DIAGNOSIS — F3170 Bipolar disorder, currently in remission, most recent episode unspecified: Secondary | ICD-10-CM

## 2016-07-24 DIAGNOSIS — F319 Bipolar disorder, unspecified: Secondary | ICD-10-CM | POA: Diagnosis not present

## 2016-07-24 DIAGNOSIS — F3162 Bipolar disorder, current episode mixed, moderate: Secondary | ICD-10-CM | POA: Diagnosis not present

## 2016-07-24 DIAGNOSIS — L739 Follicular disorder, unspecified: Secondary | ICD-10-CM | POA: Diagnosis not present

## 2016-07-24 DIAGNOSIS — Z79899 Other long term (current) drug therapy: Secondary | ICD-10-CM

## 2016-07-24 MED ORDER — BACITRACIN ZINC 500 UNIT/GM EX OINT: TOPICAL_OINTMENT | CUTANEOUS | 0 refills | Status: DC
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
## 2016-07-24 NOTE — Assessment & Plan Note (Signed)
Patient came to the clinic with complaint of boil in her left groin area for 2 weeks. Patient has an history of shaving her pubic hairs, developed a small boil gradually increasing in size and it was painful. She had a similar symptoms in the past requiring incision and drainage. Last night her boil started draining bloody discharge, still painful but decreased in size. She denies any fever or chills.  On exam. She was having a Small 1 x 1 cm hard indurated area at medial side of left groin, appears for radiculitis, small amount of mixed bloody and serous discharge on squeezing. Hard to assess for any erythema.  Appearance is more consistent with folliculitis. No need for incision and drainage currently as it was draining on its own.  -Warm compresses for 20 minutes 3-4 times daily. -Bacitracin ointment 3 times daily.

## 2016-07-24 NOTE — Telephone Encounter (Signed)
Spoke to pt and relayed that her lab results were normal.  She verbalized understanding.  

## 2016-07-24 NOTE — Telephone Encounter (Signed)
-----   Message from Penni Bombard, MD sent at 07/23/2016  4:54 PM EDT ----- Normal labs. Please call patient. -VRP
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
## 2016-07-24 NOTE — Assessment & Plan Note (Signed)
Follow-up with psychiatry and her therapist. He is compliant with her medications.  She denies any exaggeration of her mood, stating these medications are working for her and she feels good.  She was advised to continue her psych medications and follow up with her psychiatrist as advised.

## 2016-07-24 NOTE — Patient Instructions (Signed)
Thank you for visiting clinic today. Please apply warm compresses for 20 minutes 4 times a day. You can also apply bacitracin appointment 3 times daily. Please follow up in clinic if your symptoms get worse or if it fails to resolve it completely.  Folliculitis Folliculitis is inflammation of the hair follicles. Folliculitis most commonly occurs on the scalp, thighs, legs, back, and buttocks. However, it can occur anywhere on the body. What are the causes? This condition may be caused by:  A bacterial infection (common).  A fungal infection.  A viral infection.  Coming into contact with certain chemicals, especially oils and tars.  Shaving or waxing.  Applying greasy ointments or creams to your skin often.  Long-lasting folliculitis and folliculitis that keeps coming back can be caused by bacteria that live in the nostrils. What increases the risk? This condition is more likely to develop in people with:  A weakened immune system.  Diabetes.  Obesity.  What are the signs or symptoms? Symptoms of this condition include:  Redness.  Soreness.  Swelling.  Itching.  Small white or yellow, pus-filled, itchy spots (pustules) that appear over a reddened area. If there is an infection that goes deep into the follicle, these may develop into a boil (furuncle).  A group of closely packed boils (carbuncle). These tend to form in hairy, sweaty areas of the body.  How is this diagnosed? This condition is diagnosed with a skin exam. To find what is causing the condition, your health care provider may take a sample of one of the pustules or boils for testing. How is this treated? This condition may be treated by:  Applying warm compresses to the affected areas.  Taking an antibiotic medicine or applying an antibiotic medicine to the skin.  Applying or bathing with an antiseptic solution.  Taking an over-the-counter medicine to help with itching.  Having a procedure to  drain any pustules or boils. This may be done if a pustule or boil contains a lot of pus or fluid.  Laser hair removal. This may be done to treat long-lasting folliculitis.  Follow these instructions at home:  If directed, apply heat to the affected area as often as told by your health care provider. Use the heat source that your health care provider recommends, such as a moist heat pack or a heating pad. ? Place a towel between your skin and the heat source. ? Leave the heat on for 20-30 minutes. ? Remove the heat if your skin turns bright red. This is especially important if you are unable to feel pain, heat, or cold. You may have a greater risk of getting burned.  If you were prescribed an antibiotic medicine, use it as told by your health care provider. Do not stop using the antibiotic even if you start to feel better.  Take over-the-counter and prescription medicines only as told by your health care provider.  Do not shave irritated skin.  Keep all follow-up visits as told by your health care provider. This is important. Get help right away if:  You have more redness, swelling, or pain in the affected area.  Red streaks are spreading from the affected area.  You have a fever. This information is not intended to replace advice given to you by your health care provider. Make sure you discuss any questions you have with your health care provider. Document Released: 04/16/2001 Document Revised: 08/26/2015 Document Reviewed: 11/26/2014 Elsevier Interactive Patient Education  2018 [employer].

## 2016-07-25 NOTE — Progress Notes (Signed)
Internal Medicine Clinic Attending  Case discussed with Dr. Amin at the time of the visit.  We reviewed the resident's history and exam and pertinent patient test results.  I agree with the assessment, diagnosis, and plan of care documented in the resident's note.    

## 2016-07-30 ENCOUNTER — Encounter: Payer: Self-pay | Admitting: *Deleted

## 2016-07-30 ENCOUNTER — Ambulatory Visit (INDEPENDENT_AMBULATORY_CARE_PROVIDER_SITE_OTHER): Payer: Medicare Other | Admitting: Clinical

## 2016-07-30 ENCOUNTER — Encounter (HOSPITAL_COMMUNITY): Payer: Self-pay | Admitting: Clinical

## 2016-07-30 DIAGNOSIS — F3162 Bipolar disorder, current episode mixed, moderate: Secondary | ICD-10-CM | POA: Diagnosis not present

## 2016-07-30 NOTE — Progress Notes (Signed)
   THERAPIST PROGRESS NOTE  Session Time: 7:03 - 8:00   Participation Level: Active  Behavioral Response: CasualAlertAnxious   Type of Therapy: Individual Therapy  Treatment Goals addressed: improve psychiatric symptoms, controlled behavior, moderate mood, and deliberate speech, improve unhelpful thought patterns,  interpersonal relationship skills,   healthy coping skills  Interventions: Motivational Interviewing, CBT, psychoeducation  Summary: Jordan [name] is a 36 y.o. female who presents with Moderate mixed bipolar I disorder   Suicidal/Homicidal: No -without intent/plan  Therapist Response:  [name] met with clinician for an individual session. Jordan [name] discussed her psychiatric symptoms, her current life events and her homework. Jordan [name] shared that she has been depressed. She shared that some one close to her was exhibiting behaviors that were triggering for her. Clinician asked open ended questions and Jordan [name] shared about the events, her negative thoughts, and emotions. Clinician asked open ended questions and Jordan [name] identified her desired outcome. She then identified the evidence for and against her negative thoughts. She then identified healthier alternative healthier alternative thoughts. Jordan [name] and clinician discussed how her action might change if she believed the healthier alternative thoughts. Jordan [name] and clinician reviewed and discussed her homework packet - Jordan [name] and clinician discussed self compassion. Jordan [name] and clinician discussed the triggers that keep her from self compassion. Jordan [name] and clinician discussed rage and compassion. Jordan [name] and clinician discussed interpersonal relationship skills. Jordan [name] and clinician discussed ways to continue to challenge her unhelpful thoughts and emotions.  Plan: Return again in 1 week.  Diagnosis: Axis I: Moderate mixed bipolar I disorder    Waneta Fitting A, LCSW 07/30/2016

## 2016-08-02 ENCOUNTER — Other Ambulatory Visit (HOSPITAL_COMMUNITY): Payer: Self-pay | Admitting: Psychiatry

## 2016-08-02 DIAGNOSIS — F3132 Bipolar disorder, current episode depressed, moderate: Secondary | ICD-10-CM

## 2016-08-03 ENCOUNTER — Other Ambulatory Visit: Payer: Self-pay

## 2016-08-03 DIAGNOSIS — M542 Cervicalgia: Secondary | ICD-10-CM

## 2016-08-03 MED ORDER — PREGABALIN 150 MG PO CAPS: 150.0000 mg | ORAL_CAPSULE | Freq: Two times a day (BID) | ORAL | 3 refills | Status: DC

## 2016-08-03 NOTE — Telephone Encounter (Signed)
pregabalin (LYRICA) 150 MG capsule, refill request @ [facility] on [address]. Pt is out of meds, requesting if it can be done by today.

## 2016-08-06 ENCOUNTER — Encounter (HOSPITAL_COMMUNITY): Payer: Self-pay | Admitting: Clinical

## 2016-08-06 ENCOUNTER — Ambulatory Visit (INDEPENDENT_AMBULATORY_CARE_PROVIDER_SITE_OTHER): Payer: Medicare Other | Admitting: Clinical

## 2016-08-06 DIAGNOSIS — F3162 Bipolar disorder, current episode mixed, moderate: Secondary | ICD-10-CM

## 2016-08-06 NOTE — Progress Notes (Signed)
   THERAPIST PROGRESS NOTE  Session Time: 7:08 - 7:56   Participation Level: Active  Behavioral Response: CasualDrowsyDepressed  Type of Therapy: Individual Therapy  Treatment Goals addressed: improve psychiatric symptoms, controlled behavior, moderate mood, and deliberate speech (improved social and work functioning and decrease impulsive behavior), improve unhelpful thought patterns, elevate mood (increased hopefulness and social interactions), interpersonal relationship skills, discuss and process trauma, learn about diagnosis, healthy coping skills  Interventions: Motivational Interviewing, CBT, Grounding and Mindfulness Techniques   Summary: KARON COTTERILL is a 36 y.o. female who presents with Moderate mixed bipolar I disorder   Suicidal/Homicidal: No -without intent/plan  Therapist Response:  Amity met with clinician for an individual session. Ellah discussed her psychiatric symptoms, her current life events and her homework. Normalee shared that she has been having a difficult time with her psychiatric symptoms. Clinician asked open ended questions. She shared that she has not been able to get her refill of Lyrica. She shared that her pain has been very high. She shared that the pain has increased so has her depression. She shared that she has also been struggling with relationship issues. She shared that she did not do her homework this week due to stress. Clinician asked open ended questions and Dinah shared her thoughts and emotions. She then identified solutions to her problems. Client and clinician discussed interpersonal relationship skills. Client and clinician discussed healthy ways to communicate. Clinician informed Panagiota that clinician would be leaving [place]. Clinician asked open ended questions and Lamia shared her thoughts and emotions. Client and clinician discussed her options for continued care.     Plan: Return again in 2 week.  Diagnosis: Axis I: Moderate  mixed bipolar I disorder     Christne Platts A, LCSW 08/06/2016

## 2016-08-06 NOTE — Telephone Encounter (Signed)
Satya from [facility] needs to speak with a nurse about Dr Wynetta Emery DEA number. Pt is at the pharmacy, please call back.

## 2016-08-06 NOTE — Telephone Encounter (Signed)
Called to pharm 

## 2016-08-08 ENCOUNTER — Other Ambulatory Visit (HOSPITAL_COMMUNITY): Payer: Self-pay | Admitting: Psychiatry

## 2016-08-13 ENCOUNTER — Ambulatory Visit (HOSPITAL_COMMUNITY): Payer: Self-pay | Admitting: Clinical

## 2016-08-15 ENCOUNTER — Ambulatory Visit
Admission: RE | Admit: 2016-08-15 | Discharge: 2016-08-15 | Disposition: A | Discharge status: Home / Self Care | Payer: Medicare Other | Source: Ambulatory Visit | Attending: Diagnostic Neuroimaging | Admitting: Diagnostic Neuroimaging

## 2016-08-15 DIAGNOSIS — R6889 Other general symptoms and signs: Secondary | ICD-10-CM

## 2016-08-15 DIAGNOSIS — R413 Other amnesia: Secondary | ICD-10-CM

## 2016-08-15 DIAGNOSIS — Z79899 Other long term (current) drug therapy: Secondary | ICD-10-CM

## 2016-08-20 ENCOUNTER — Ambulatory Visit (INDEPENDENT_AMBULATORY_CARE_PROVIDER_SITE_OTHER): Payer: Medicare Other | Admitting: Clinical

## 2016-08-20 ENCOUNTER — Encounter (HOSPITAL_COMMUNITY): Payer: Self-pay | Admitting: Clinical

## 2016-08-20 DIAGNOSIS — F3162 Bipolar disorder, current episode mixed, moderate: Secondary | ICD-10-CM | POA: Diagnosis not present
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
## 2016-08-24 ENCOUNTER — Telehealth: Payer: Self-pay | Admitting: *Deleted

## 2016-08-24 NOTE — Telephone Encounter (Signed)
Spoke to pt and relayed per Dr. Leta Baptist that the MRI results were unremarkable.  Continue current plan.  She verbalized understanding.

## 2016-08-24 NOTE — Telephone Encounter (Signed)
-----   Message from Penni Bombard, MD sent at 08/23/2016  3:35 PM EDT ----- Unremarkable imaging results. Please call patient. Continue current plan. -VRP

## 2016-08-28 ENCOUNTER — Encounter (HOSPITAL_COMMUNITY): Payer: Self-pay | Admitting: Clinical

## 2016-08-28 ENCOUNTER — Ambulatory Visit (INDEPENDENT_AMBULATORY_CARE_PROVIDER_SITE_OTHER): Payer: Medicare Other | Admitting: Clinical

## 2016-08-28 DIAGNOSIS — F3162 Bipolar disorder, current episode mixed, moderate: Secondary | ICD-10-CM | POA: Diagnosis not present

## 2016-08-28 NOTE — Progress Notes (Signed)
   THERAPIST PROGRESS NOTE  Session Time: 7:07 -8:05  Participation Level: Active  Behavioral Response: CasualAlertAnxious  Type of Therapy: Individual Therapy  Treatment Goals addressed: improve psychiatric symptoms, controlled behavior, moderate mood, and deliberate speech , improve unhelpful thought patterns, elevate mood , interpersonal relationship skills,  learn about diagnosis, healthy coping skills  Interventions: Motivational Interviewing, Grounding and Mindfulness Techniques   Summary: KATELYNE GALSTER is a 36 y.o. female who presents with Moderate mixed bipolar I disorder   Suicidal/Homicidal: No -without intent/plan  Therapist Response:  Carmeline met with clinician for an individual session. Tirza discussed her psychiatric symptoms, her current life events and her homework. Krishika shared that she has been having difficulty with her physical and emotional symptoms. Clinician asked open ended questions and Kevyn identified her stressors and their connection to her physical and mental symptoms. Client and clinician discussed her Bipolar symptoms Client and clinician discussed grounding and mindfulness techniques. Clinician asked open ended questions and Caydance identified the techniques that have worked for her in the past. Clinician asked open ended questions and Jamylah identified healthy coping skills that have worked in the past. Clinician asked open ended questions about the ones she has been using recently. Delorice had the insight that she has not been using any of the skills that have helped her in the past. She shared she had resorted to her old unhelpful behaviors though she had not completely believed her old thoughts. Clinician asked open ended questions and Kamrynn was able to identify signals that she was resorting to her old patterns. Clinician asked open ended questions and Taylie identified ways she could interrupt old patterns and put her healthy coping, grounding and  mindfulness techniques back into practice. Client and clinician discussed healthy interpersonal relationship skills.  This is Betzaira and Clinician's last session together. This was discussed in earlier sessions. [name] and clinician bid farewell to each other.  Plan: Return again in 2 week.  Diagnosis: Axis I: Moderate mixed bipolar I disorder     Stormy Sabol A, LCSW 08/28/2016

## 2016-09-04 ENCOUNTER — Ambulatory Visit (HOSPITAL_COMMUNITY): Payer: Self-pay | Admitting: Clinical

## 2016-09-11 ENCOUNTER — Ambulatory Visit (HOSPITAL_COMMUNITY): Payer: Self-pay | Admitting: Clinical

## 2016-09-12 ENCOUNTER — Other Ambulatory Visit: Payer: Self-pay | Admitting: *Deleted

## 2016-09-13 MED ORDER — BACITRACIN ZINC 500 UNIT/GM EX OINT: TOPICAL_OINTMENT | CUTANEOUS | 0 refills | Status: DC

## 2016-09-18 ENCOUNTER — Ambulatory Visit (HOSPITAL_COMMUNITY): Payer: Self-pay | Admitting: Clinical

## 2016-09-24 ENCOUNTER — Ambulatory Visit (INDEPENDENT_AMBULATORY_CARE_PROVIDER_SITE_OTHER): Payer: Medicare Other | Admitting: Internal Medicine

## 2016-09-24 ENCOUNTER — Encounter: Payer: Self-pay | Admitting: Internal Medicine

## 2016-09-24 VITALS — BP 82/69 | Wt 249.2 lb

## 2016-09-24 DIAGNOSIS — K219 Gastro-esophageal reflux disease without esophagitis: Secondary | ICD-10-CM

## 2016-09-24 DIAGNOSIS — Z791 Long term (current) use of non-steroidal anti-inflammatories (NSAID): Secondary | ICD-10-CM | POA: Diagnosis not present

## 2016-09-24 DIAGNOSIS — Z8249 Family history of ischemic heart disease and other diseases of the circulatory system: Secondary | ICD-10-CM | POA: Diagnosis not present

## 2016-09-24 DIAGNOSIS — R1013 Epigastric pain: Secondary | ICD-10-CM | POA: Diagnosis not present

## 2016-09-24 DIAGNOSIS — Z818 Family history of other mental and behavioral disorders: Secondary | ICD-10-CM | POA: Diagnosis not present

## 2016-09-24 DIAGNOSIS — Z79899 Other long term (current) drug therapy: Secondary | ICD-10-CM | POA: Diagnosis not present

## 2016-09-24 DIAGNOSIS — I950 Idiopathic hypotension: Secondary | ICD-10-CM | POA: Diagnosis not present

## 2016-09-24 DIAGNOSIS — M797 Fibromyalgia: Secondary | ICD-10-CM | POA: Diagnosis not present

## 2016-09-24 DIAGNOSIS — F319 Bipolar disorder, unspecified: Secondary | ICD-10-CM | POA: Diagnosis not present

## 2016-09-24 MED ORDER — MELOXICAM 7.5 MG PO TABS: 7.5000 mg | ORAL_TABLET | Freq: Every day | ORAL | 0 refills | Status: DC

## 2016-09-24 MED ORDER — FAMOTIDINE 20 MG PO TABS: 20.0000 mg | ORAL_TABLET | Freq: Two times a day (BID) | ORAL | 3 refills | Status: DC

## 2016-09-24 NOTE — Assessment & Plan Note (Signed)
Patient complaining of epigastric abdominal pain and nausea associated with meals and when lying in a supine position. She describes the pain as burning and also endorses one episode of emesis. Has been trying Tums at home with minimal relief in symptoms.   - Pepcid 20mg  BID

## 2016-09-24 NOTE — Patient Instructions (Addendum)
It was a pleasure to meet you today, Ms. Moncure.   You were seen today for pain related to your fibromyalgia. We will give you a short course of an anti-inflammatory medication called meloxicam. Take 1 tablet prior to exercising for pain relief.   I have sent the prescription to your pharmacy.
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
## 2016-09-24 NOTE — Assessment & Plan Note (Signed)
Patient complaining of pain on both shoulder, hips and knees. States she feels a shooting pain in her joints that gets progressively worse throughout the day and worse at night. She is currently on Lyrica at home and reports good control of pain with it. She recently joined the gym and a kickball and softball teams and would like additional medication for pain for the next few days while her body adjusts to the increase in physical activity. She initially requested tramadol since it was she has been given in the past. Though on further questioning she would like to try meloxicam since it has also work in the past.   - Meloxicam 7.5mg  once a day PRN prior to exercising for pain relief  - Advised to call if symptoms worsened

## 2016-09-25 ENCOUNTER — Ambulatory Visit (HOSPITAL_COMMUNITY): Payer: Self-pay | Admitting: Clinical

## 2016-09-26 NOTE — Progress Notes (Signed)
Internal Medicine Clinic Attending  I saw and evaluated the patient.  I personally confirmed the key portions of the history and exam documented by Dr. Santos-Sanchez and I reviewed pertinent patient test results.  The assessment, diagnosis, and plan were formulated together and I agree with the documentation in the resident's note. 

## 2016-10-02 ENCOUNTER — Other Ambulatory Visit (HOSPITAL_COMMUNITY): Payer: Self-pay | Admitting: Psychiatry

## 2016-10-02 ENCOUNTER — Ambulatory Visit (HOSPITAL_COMMUNITY): Payer: Self-pay | Admitting: Clinical

## 2016-10-02 ENCOUNTER — Telehealth (HOSPITAL_COMMUNITY): Payer: Self-pay

## 2016-10-02 DIAGNOSIS — F3132 Bipolar disorder, current episode depressed, moderate: Secondary | ICD-10-CM

## 2016-10-02 MED ORDER — DIVALPROEX SODIUM ER 500 MG PO TB24: 1000.0000 mg | ORAL_TABLET | Freq: Every day | ORAL | 0 refills | Status: DC

## 2016-10-02 NOTE — Telephone Encounter (Signed)
Patient called and she states that she has been having really bad mood swings, crying spells and increased depression. She is scheduled to follow up on 8/27 and would like to know if she can increase the Depakote to twice a day. Please review and advise, thank you

## 2016-10-02 NOTE — Telephone Encounter (Signed)
She can try Depakote 500 mg twice a day but watch carefully for excessive sedation.

## 2016-10-02 NOTE — Telephone Encounter (Signed)
Called patient and let her know that the new prescription was sent to the pharmacy

## 2016-10-03 ENCOUNTER — Telehealth: Payer: Self-pay | Admitting: Internal Medicine

## 2016-10-03 DIAGNOSIS — M722 Plantar fascial fibromatosis: Secondary | ICD-10-CM

## 2016-10-03 NOTE — Telephone Encounter (Signed)
Patient uses the [facility] and is requesting a new Ref per their office to be seen.  Please Advise.

## 2016-10-09 ENCOUNTER — Other Ambulatory Visit: Payer: Self-pay | Admitting: Internal Medicine

## 2016-10-09 ENCOUNTER — Ambulatory Visit (HOSPITAL_COMMUNITY): Payer: Self-pay | Admitting: Clinical

## 2016-10-09 DIAGNOSIS — M722 Plantar fascial fibromatosis: Secondary | ICD-10-CM

## 2016-10-09 NOTE — Telephone Encounter (Signed)
Called patient to ask why she needs referral to podiatry as I saw no indication for this in her chart. Patient stated she has been seeing podiatry for a long time for plantar facsiitis. Ambulatory referral to podiatry made.

## 2016-10-12 DIAGNOSIS — M545 Low back pain: Secondary | ICD-10-CM | POA: Diagnosis not present

## 2016-10-15 ENCOUNTER — Encounter (HOSPITAL_COMMUNITY): Payer: Self-pay | Admitting: Psychiatry

## 2016-10-15 ENCOUNTER — Ambulatory Visit (INDEPENDENT_AMBULATORY_CARE_PROVIDER_SITE_OTHER): Payer: Medicare Other | Admitting: Psychiatry

## 2016-10-15 DIAGNOSIS — Z818 Family history of other mental and behavioral disorders: Secondary | ICD-10-CM

## 2016-10-15 DIAGNOSIS — Z813 Family history of other psychoactive substance abuse and dependence: Secondary | ICD-10-CM

## 2016-10-15 DIAGNOSIS — Z811 Family history of alcohol abuse and dependence: Secondary | ICD-10-CM

## 2016-10-15 DIAGNOSIS — Z598 Other problems related to housing and economic circumstances: Secondary | ICD-10-CM | POA: Diagnosis not present

## 2016-10-15 DIAGNOSIS — M549 Dorsalgia, unspecified: Secondary | ICD-10-CM

## 2016-10-15 DIAGNOSIS — F3132 Bipolar disorder, current episode depressed, moderate: Secondary | ICD-10-CM

## 2016-10-15 MED ORDER — DIVALPROEX SODIUM ER 250 MG PO TB24: 750.0000 mg | ORAL_TABLET | Freq: Every day | ORAL | 1 refills | Status: DC
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
## 2016-10-16 ENCOUNTER — Ambulatory Visit (HOSPITAL_COMMUNITY): Payer: Self-pay | Admitting: Clinical

## 2016-10-16 ENCOUNTER — Ambulatory Visit (INDEPENDENT_AMBULATORY_CARE_PROVIDER_SITE_OTHER): Payer: Medicare Other | Admitting: Clinical

## 2016-10-16 ENCOUNTER — Encounter (HOSPITAL_COMMUNITY): Payer: Self-pay | Admitting: Clinical

## 2016-10-16 DIAGNOSIS — F3162 Bipolar disorder, current episode mixed, moderate: Secondary | ICD-10-CM | POA: Diagnosis not present

## 2016-10-16 NOTE — Progress Notes (Signed)
   THERAPIST PROGRESS NOTE  Session Time: 8:05am-9:05am Participation Level: Active  Behavioral Response: Well GroomedAlertNA   Type of Therapy: Individual Therapy  Treatment Goals addressed: improve psychiatric symptoms, controlled behavior, moderate mood, and deliberate speech (improved social and work functioning and decrease impulsive behavior), improve unhelpful thought patterns, elevate mood (increased hopefulness and social interactions), interpersonal relationship skills, learn about diagnosis, healthy coping skills  Interventions: Motivational Interviewing, CBT, Grounding and Mindfulness Techniques   Summary: ANAHLIA ISEMINGER is a 36 y.o. female who presents with Moderate mixed bipolar I disorder   Suicidal/Homicidal: No -without intent/plan  Therapist Response:  Kyira met with clinician for an individual session. Anacarolina discussed her psychiatric symptoms, her current life events and her homework. Romonia shared concerns about her current relationship and interactions with partner. Tenley reports utilizing coping skills and ability to identify symptoms of Bipolar Disorder in her partner. Anetta also reports being able to use her coping skills to assist herself in dealing with emotional interactions with other family members. Laury reports her relationship with father has steadily improved, and her ability to deal with past has also improved. [name] reviewed homework and noted questions about her distress tolerance. Clinician noted strengths and progress toward goals.   Plan: Return again in 2 weeks.  Diagnosis: Axis I: Moderate mixed bipolar I disorder   Nida Boatman. LCSW 10/16/16  Angelika Jerrett A, LCSW 10/16/2016

## 2016-10-23 ENCOUNTER — Ambulatory Visit (HOSPITAL_COMMUNITY): Payer: Self-pay | Admitting: Clinical

## 2016-10-29 ENCOUNTER — Ambulatory Visit: Payer: Medicare Other | Attending: Orthopedic Surgery | Admitting: Physical Therapy

## 2016-10-29 DIAGNOSIS — M25651 Stiffness of right hip, not elsewhere classified: Secondary | ICD-10-CM | POA: Insufficient documentation

## 2016-10-29 DIAGNOSIS — M6281 Muscle weakness (generalized): Secondary | ICD-10-CM | POA: Diagnosis not present

## 2016-10-29 DIAGNOSIS — R293 Abnormal posture: Secondary | ICD-10-CM

## 2016-10-29 DIAGNOSIS — M62838 Other muscle spasm: Secondary | ICD-10-CM | POA: Insufficient documentation

## 2016-10-29 DIAGNOSIS — M545 Low back pain, unspecified: Secondary | ICD-10-CM
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
## 2016-10-29 NOTE — Patient Instructions (Signed)

## 2016-11-01 ENCOUNTER — Encounter (HOSPITAL_COMMUNITY): Payer: Self-pay | Admitting: Licensed Clinical Social Worker

## 2016-11-01 ENCOUNTER — Encounter: Payer: Self-pay | Admitting: Physical Therapy

## 2016-11-01 ENCOUNTER — Ambulatory Visit: Payer: Medicare Other | Admitting: Physical Therapy

## 2016-11-01 ENCOUNTER — Ambulatory Visit (INDEPENDENT_AMBULATORY_CARE_PROVIDER_SITE_OTHER): Payer: Medicare Other | Admitting: Licensed Clinical Social Worker

## 2016-11-01 DIAGNOSIS — M545 Low back pain, unspecified: Secondary | ICD-10-CM

## 2016-11-01 DIAGNOSIS — M25651 Stiffness of right hip, not elsewhere classified: Secondary | ICD-10-CM

## 2016-11-01 DIAGNOSIS — R293 Abnormal posture: Secondary | ICD-10-CM | POA: Diagnosis not present

## 2016-11-01 DIAGNOSIS — M6281 Muscle weakness (generalized): Secondary | ICD-10-CM | POA: Diagnosis not present

## 2016-11-01 DIAGNOSIS — M62838 Other muscle spasm: Secondary | ICD-10-CM

## 2016-11-01 DIAGNOSIS — F3162 Bipolar disorder, current episode mixed, moderate: Secondary | ICD-10-CM

## 2016-11-01 NOTE — Progress Notes (Signed)
   THERAPIST PROGRESS NOTE  Session Time: 9:15am-10:00am  Participation Level: Active  Behavioral Response: NeatAlertEuthymic  Type of Therapy: Individual Therapy  Treatment Goals addressed: improve psychiatric symptoms, controlled behavior, moderate mood, and deliberate speech (improved social and work functioning and decrease impulsive behavior), improve unhelpful thought patterns, elevate mood (increased hopefulness and social interactions), interpersonal relationship skills, discuss and process trauma, learn about diagnosis, healthy coping skills  Interventions: Motivational Interviewing, CBT, Grounding and Mindfulness Techniques   Summary: Jordan [name] is a 36 y.o. female who presents with Moderate mixed bipolar I disorder   Suicidal/Homicidal: No -without intent/plan  Therapist Response:  Jordan [name] met with clinician for an individual session. Jordan [name] discussed her psychiatric symptoms, her current life events and her homework. Jordan [name] shared recent introductions and relationship building with her half-brother who was born before Jordan [name] mother and father were together. Jordan [name] discussed her initial response to this realization, as well as her current relationship, thoughts, and feelings about this person. Jordan [name] reports feeling connected to this brother and his family, which has become a great addition to her natural support system. Jordan [name] discussed wanting to introduce her brother to her father, who has never mentioned this person to [name]. Jordan [name] engaged well in CBT "Best Case Scenario/Worst Case Scenario" intervention. Jordan [name] also processed current relationship concerns and her phase of treatment vs her partner's phase of treatment.   Plan: Return again in 2 week.  Diagnosis: Axis I: Moderate mixed bipolar I disorder    [name], LCSW 11/01/2016

## 2016-11-01 NOTE — Patient Instructions (Signed)
Hip stretch issued from ex drawer Sitting or supine with feet together,  Hips/ knees flexed Move knees apart. Daily 3 X 30 seconds
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
## 2016-11-05 ENCOUNTER — Ambulatory Visit: Payer: Medicare Other | Admitting: Physical Therapy

## 2016-11-05 ENCOUNTER — Encounter: Payer: Self-pay | Admitting: Podiatry

## 2016-11-05 ENCOUNTER — Encounter: Payer: Self-pay | Admitting: Physical Therapy

## 2016-11-05 ENCOUNTER — Ambulatory Visit (INDEPENDENT_AMBULATORY_CARE_PROVIDER_SITE_OTHER): Payer: Medicare Other

## 2016-11-05 ENCOUNTER — Ambulatory Visit (INDEPENDENT_AMBULATORY_CARE_PROVIDER_SITE_OTHER): Payer: Medicare Other | Admitting: Podiatry

## 2016-11-05 ENCOUNTER — Other Ambulatory Visit: Payer: Self-pay | Admitting: Podiatry

## 2016-11-05 DIAGNOSIS — M6281 Muscle weakness (generalized): Secondary | ICD-10-CM | POA: Diagnosis not present

## 2016-11-05 DIAGNOSIS — M79671 Pain in right foot: Secondary | ICD-10-CM | POA: Diagnosis not present

## 2016-11-05 DIAGNOSIS — M62838 Other muscle spasm: Secondary | ICD-10-CM | POA: Diagnosis not present

## 2016-11-05 DIAGNOSIS — M545 Low back pain, unspecified: Secondary | ICD-10-CM

## 2016-11-05 DIAGNOSIS — M722 Plantar fascial fibromatosis: Secondary | ICD-10-CM | POA: Diagnosis not present

## 2016-11-05 DIAGNOSIS — R293 Abnormal posture: Secondary | ICD-10-CM | POA: Diagnosis not present

## 2016-11-05 DIAGNOSIS — M79672 Pain in left foot: Secondary | ICD-10-CM | POA: Diagnosis not present

## 2016-11-05 DIAGNOSIS — M25651 Stiffness of right hip, not elsewhere classified: Secondary | ICD-10-CM | POA: Diagnosis not present

## 2016-11-05 MED ORDER — TRIAMCINOLONE ACETONIDE 10 MG/ML IJ SUSP
10.0000 mg | Freq: Once | INTRAMUSCULAR | Status: AC
  Administered 2016-11-05: 10 mg

## 2016-11-05 NOTE — Patient Instructions (Signed)
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
## 2016-11-06 NOTE — Progress Notes (Signed)
Subjective:    Patient ID: Jordan [name], female   DOB: 36 y.o.   MRN: [id number]   HPI patient states she's developed a lot of pain in the heels and arches of both feet and knows that she is flat-footed. States that the pain is been present for several months and she's tried different types of over-the-counter arch supports. Also does have obesity which is a, Katie factor    Review of Systems  All other systems reviewed and are negative.       Objective:  Physical Exam  Constitutional: She appears well-developed and well-nourished.  Cardiovascular: Intact distal pulses.   Pulmonary/Chest: Effort normal.  Musculoskeletal: Normal range of motion.  Neurological: She is alert.  Skin: Skin is warm.  Nursing note and vitals reviewed.  acute plantar fasciitis bilateral distal to the insertion to calcaneus. Patient was noted to have good digital perfusion well oriented 3 with moderate depression of the arch and exquisite discomfort upon palpation     Assessment:   Acute plantar fasciitis bilateral      Plan:    H&P x-rays reviewed and today I went ahead and injected distal to the insertion of the plantar fascial bilateral 3 mg Kenalog 5 mg Xylocaine and applied fascial brace bilateral lift up the arch.   X-ray report indicates significant diminishment of arch height bilateral with no indications of arthritis or stress fracture

## 2016-11-07 ENCOUNTER — Ambulatory Visit: Payer: Medicare Other | Admitting: Physical Therapy

## 2016-11-07 DIAGNOSIS — M62838 Other muscle spasm: Secondary | ICD-10-CM | POA: Diagnosis not present

## 2016-11-07 DIAGNOSIS — R293 Abnormal posture: Secondary | ICD-10-CM

## 2016-11-07 DIAGNOSIS — M545 Low back pain, unspecified: Secondary | ICD-10-CM

## 2016-11-07 DIAGNOSIS — M25651 Stiffness of right hip, not elsewhere classified: Secondary | ICD-10-CM | POA: Diagnosis not present

## 2016-11-07 DIAGNOSIS — M6281 Muscle weakness (generalized): Secondary | ICD-10-CM | POA: Diagnosis not present
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
## 2016-11-12 ENCOUNTER — Ambulatory Visit (INDEPENDENT_AMBULATORY_CARE_PROVIDER_SITE_OTHER): Payer: Medicare Other | Admitting: Diagnostic Neuroimaging

## 2016-11-12 ENCOUNTER — Ambulatory Visit: Payer: Medicare Other | Admitting: Physical Therapy

## 2016-11-12 ENCOUNTER — Encounter: Payer: Self-pay | Admitting: Diagnostic Neuroimaging

## 2016-11-12 VITALS — BP 102/69 | HR 61 | Ht 69.0 in | Wt 249.0 lb

## 2016-11-12 DIAGNOSIS — M62838 Other muscle spasm: Secondary | ICD-10-CM | POA: Diagnosis not present

## 2016-11-12 DIAGNOSIS — R6889 Other general symptoms and signs: Secondary | ICD-10-CM | POA: Diagnosis not present

## 2016-11-12 DIAGNOSIS — R413 Other amnesia: Secondary | ICD-10-CM

## 2016-11-12 DIAGNOSIS — R293 Abnormal posture: Secondary | ICD-10-CM

## 2016-11-12 DIAGNOSIS — M545 Low back pain, unspecified: Secondary | ICD-10-CM

## 2016-11-12 DIAGNOSIS — M6281 Muscle weakness (generalized): Secondary | ICD-10-CM | POA: Diagnosis not present

## 2016-11-12 DIAGNOSIS — M25651 Stiffness of right hip, not elsewhere classified: Secondary | ICD-10-CM | POA: Diagnosis not present
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
## 2016-11-14 ENCOUNTER — Ambulatory Visit: Payer: Medicare Other | Admitting: Physical Therapy

## 2016-11-14 DIAGNOSIS — M25651 Stiffness of right hip, not elsewhere classified: Secondary | ICD-10-CM | POA: Diagnosis not present

## 2016-11-14 DIAGNOSIS — R293 Abnormal posture: Secondary | ICD-10-CM

## 2016-11-14 DIAGNOSIS — M62838 Other muscle spasm: Secondary | ICD-10-CM | POA: Diagnosis not present

## 2016-11-14 DIAGNOSIS — M545 Low back pain, unspecified: Secondary | ICD-10-CM

## 2016-11-14 DIAGNOSIS — M6281 Muscle weakness (generalized): Secondary | ICD-10-CM | POA: Diagnosis not present
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
## 2016-11-19 ENCOUNTER — Ambulatory Visit: Payer: Medicare Other | Attending: Orthopedic Surgery | Admitting: Physical Therapy

## 2016-11-19 ENCOUNTER — Encounter: Payer: Self-pay | Admitting: Physical Therapy

## 2016-11-19 ENCOUNTER — Ambulatory Visit (HOSPITAL_COMMUNITY): Payer: Self-pay | Admitting: Licensed Clinical Social Worker

## 2016-11-19 DIAGNOSIS — M25651 Stiffness of right hip, not elsewhere classified: Secondary | ICD-10-CM | POA: Insufficient documentation

## 2016-11-19 DIAGNOSIS — M62838 Other muscle spasm: Secondary | ICD-10-CM | POA: Diagnosis not present

## 2016-11-19 DIAGNOSIS — R293 Abnormal posture: Secondary | ICD-10-CM | POA: Diagnosis not present

## 2016-11-19 DIAGNOSIS — M545 Low back pain, unspecified: Secondary | ICD-10-CM

## 2016-11-19 DIAGNOSIS — M6281 Muscle weakness (generalized): Secondary | ICD-10-CM | POA: Insufficient documentation

## 2016-11-19 NOTE — Therapy (Signed)
[facility] [address] [place], [place], [address] Phone: [phone]   Fax:  [phone]  Physical Therapy Treatment  Patient Details  Name: Jordan [name] MRN: [id number] Date of Birth: Apr 01, 1980 Referring Provider: Dr. Estill Bamberg  Encounter Date: 11/19/2016      PT End of Session - 11/19/16 1157    Visit Number 7   Number of Visits 12   Date for PT Re-Evaluation 12/10/16   PT Start Time 1100   PT Stop Time 1145   PT Time Calculation (min) 45 min   Activity Tolerance Patient tolerated treatment well   Behavior During Therapy [facility] for tasks assessed/performed      Past Medical History:  Diagnosis Date  . Allergic rhinitis    pt takes flonase for episodes, no episodes in 2012  . Anxiety   . Basal cell carcinoma    recurrent in left maxillary, has had 5 surguries, last one 2003  . Basal cell nevus syndrome    dx age 66  . Bipolar 1 disorder (HCC)   . Candidiasis, vagina    recurrent, pt has made lifestyle modifications and none recently (as of 8/12)  . Cyst of nasal sinus   . Fibroma    left ovarian  . Fibromyalgia   . Gardnerella infection    + in 08/08  . GERD (gastroesophageal reflux disease)    occ  . Herniated disc   . OVARIAN CYSTECTOMY, HX OF 03/01/2006   ovarian fibroma-left 1996, L ovary and fallopian tube removed    . Paronychia of third finger of left hand 07/28/2010  . PLANTAR FASCIITIS, BILATERAL 05/24/2009  . PTSD (post-traumatic stress disorder)   . Whitlow    hx of herpetic requiring I+D,( was bitten by autistic child that cares fr    Past Surgical History:  Procedure Laterality Date  . CHOLECYSTECTOMY    . LEFT OOPHORECTOMY    . LUMBAR LAMINECTOMY/DECOMPRESSION MICRODISCECTOMY N/A 08/14/2012   Procedure: LUMBAR LAMINECTOMY/DECOMPRESSION MICRODISCECTOMY Lumbar 5 -sacrum 1 decompression;  Surgeon: Emilee Hero, MD;  Location: [facility] OR;  Service: Orthopedics;  Laterality: N/A;  Lumbar 5  -sacrum 1 decompression  . MANDIBLE RECONSTRUCTION  89   upper anfd lower jaw cyst  . NASAL SINUS SURGERY     X 2 at age 66 & 19    There were no vitals filed for this visit.      Subjective Assessment - 11/19/16 1109    Subjective I just got out of the gym.  I have no pain.     Currently in Pain? No/denies   Pain Location Back   Pain Orientation Lower   Pain Relieving Factors stretching                         [facility] Adult PT Treatment/Exercise - 11/19/16 0001      Lumbar Exercises: Stretches   Passive Hamstring Stretch 3 reps;30 seconds   Passive Hamstring Stretch Limitations strap spine.  tight.  both     Lumbar Exercises: Aerobic   Stationary Bike Nu step 8 minutes UE/LE,  L5     Knee/Hip Exercises: Stretches   Lobbyist 3 reps;30 seconds  2 sets ,  1 set supine,  1 set prone,  concurrent with heat   Hip Flexor Stretch 3 reps;30 seconds  2 sets, 1 prone and 1 supine with heat/  strap.   Other Knee/Hip Stretches Prone IR/ER hip stretches.  3 x  30 seconds.  Care taken to avoid tweaking her knee.  Right hip IER increased back pain (soereness  vs pain)      Knee/Hip Exercises: Aerobic   Nustep L6 x 8 min                PT Education - 11/19/16 1156    Education provided Yes   Education Details HEP   Person(s) Educated Patient   Methods Explanation;Demonstration   Comprehension Verbalized understanding             PT Long Term Goals - 11/19/16 1201      PT LONG TERM GOAL #1   Title independent with HEP   Baseline independent with exercises added to HEP   Time 6   Period Weeks   Status On-going     PT LONG TERM GOAL #2   Title demonstrate proper lifting/squatting techniques in order to safely perform community fitness exercises   Baseline working on squatting at the gym   Time 6   Period Weeks   Status Unable to assess     PT LONG TERM GOAL #3   Title report pain < 3/10 for improved function and mobility   Baseline No  pain today.  Consistant?   Time 6   Period Weeks   Status On-going     PT LONG TERM GOAL #4   Title perform lumbar ROM without increase in pain for improved function and mobility   Baseline Some RPM painful   Time 6   Period Weeks   Status On-going               Plan - 11/19/16 1158    Clinical Impression Statement Right low back soreness noted post stretching hip ER. prone.  It feels exercises vs pain.  Pain was eased some with standing.  Continued work on stretching as last session since she just arrived from a core workout with her trainer at the gym.  She arrived with no pain.  ROM in hamstrings is slightly improved since I last saw her.  Posture now more upright.  She is able to walk 2 miles.     PT Next Visit Plan stretching, lumbopelvic strengthening, modalities PRN.  Review hip ER stretches.   PT Home Exercise Plan hip ER/Groin stretch, HSS with strap, thomas test stretch, bridge with pillow, plank-elbow & knees, prone hip ext, sidelying hip abd; Hip ER stretching.  sitting or supine with feet together   Consulted and Agree with Plan of Care Patient      Patient will benefit from skilled therapeutic intervention in order to improve the following deficits and impairments:     Visit Diagnosis: Acute left-sided low back pain without sciatica  Abnormal posture  Other muscle spasm  Weakness of trunk musculature  Stiffness of joint of right pelvic region and thigh     Problem List Patient Active Problem List   Diagnosis Date Noted  . Folliculitis 07/24/2016  . BPPV (benign paroxysmal positional vertigo) 03/01/2016  . Opioid dependence (HCC) 02/20/2016  . Post traumatic stress disorder (PTSD) 11/09/2015  . Hereditary and idiopathic peripheral neuropathy 09/29/2015  . Bipolar disorder (HCC) 07/04/2015  . Vaginitis 05/06/2015  . Idiopathic hypotension 02/03/2015  . At risk for abnormal blood glucose level 12/15/2013  . Preventative health care 12/19/2011  .  Fibromyalgia 06/26/2010  . Insomnia 06/26/2010  . Chronic fatigue and depression 06/26/2010  . Obesity 01/17/2010    Class: Chronic  . Lumbar disc herniation of  L5-S1 on the left side (MRI 2012 s/p surgery)  02/26/2008  . Allergic rhinitis 07/24/2006  . Gastroesophageal reflux disease 03/04/2006  . 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    Carmisha Larusso PTA 11/19/2016, 12:03 PM  [facility] [address] [place], [place], [address] Phone: [phone]   Fax:  [phone]  Name: Jordan [name] MRN: [id number] Date of Birth: 1980-06-19

## 2016-11-19 NOTE — Patient Instructions (Signed)
HIP ER stretches Daily 3 x 30 seconds Issued from Exercise drawer.  May do sitting or supine

## 2016-11-21 ENCOUNTER — Ambulatory Visit: Payer: Medicare Other | Admitting: Physical Therapy

## 2016-11-26 ENCOUNTER — Ambulatory Visit: Payer: Medicare Other | Admitting: Physical Therapy

## 2016-11-26 ENCOUNTER — Encounter: Payer: Self-pay | Admitting: Physical Therapy

## 2016-11-26 DIAGNOSIS — M545 Low back pain, unspecified: Secondary | ICD-10-CM

## 2016-11-26 DIAGNOSIS — M25651 Stiffness of right hip, not elsewhere classified: Secondary | ICD-10-CM | POA: Diagnosis not present

## 2016-11-26 DIAGNOSIS — M6281 Muscle weakness (generalized): Secondary | ICD-10-CM

## 2016-11-26 DIAGNOSIS — R293 Abnormal posture: Secondary | ICD-10-CM | POA: Diagnosis not present

## 2016-11-26 DIAGNOSIS — M62838 Other muscle spasm: Secondary | ICD-10-CM | POA: Diagnosis not present

## 2016-11-26 NOTE — Therapy (Addendum)
[place], [place], [address] Phone: [phone]   Fax:  [phone]  Physical Therapy Treatment/Discharge Summary  Patient Details  Name: Jordan [name] MRN: [id number] Date of Birth: 08-Aug-1980 Referring Provider: Dr. Phylliss Bob  Encounter Date: 11/26/2016      PT End of Session - 11/26/16 1141    Visit Number 8   Number of Visits 12   Date for PT Re-Evaluation 12/10/16   PT Start Time 1102   PT Stop Time 1140   PT Time Calculation (min) 38 min   Activity Tolerance Patient tolerated treatment well   Behavior During Therapy [facility] for tasks assessed/performed      Past Medical History:  Diagnosis Date  . Allergic rhinitis    pt takes flonase for episodes, no episodes in 2012  . Anxiety   . Basal cell carcinoma    recurrent in left maxillary, has had 5 surguries, last one 2003  . Basal cell nevus syndrome    dx age 17  . Bipolar 1 disorder ([place])   . Candidiasis, vagina    recurrent, pt has made lifestyle modifications and none recently (as of 8/12)  . Cyst of nasal sinus   . Fibroma    left ovarian  . Fibromyalgia   . Gardnerella infection    + in 08/08  . GERD (gastroesophageal reflux disease)    occ  . Herniated disc   . OVARIAN CYSTECTOMY, HX OF 03/01/2006   ovarian fibroma-left 1996, L ovary and fallopian tube removed    . Paronychia of third finger of left hand 07/28/2010  . PLANTAR FASCIITIS, BILATERAL 05/24/2009  . PTSD (post-traumatic stress disorder)   . Whitlow    hx of herpetic requiring I+D,( was bitten by autistic child that cares fr    Past Surgical History:  Procedure Laterality Date  . CHOLECYSTECTOMY    . LEFT OOPHORECTOMY    . LUMBAR LAMINECTOMY/DECOMPRESSION MICRODISCECTOMY N/A 08/14/2012   Procedure: LUMBAR LAMINECTOMY/DECOMPRESSION MICRODISCECTOMY Lumbar 5 -sacrum 1 decompression;  Surgeon: Sinclair Ship, MD;  Location: [place];  Service: Orthopedics;   Laterality: N/A;  Lumbar 5 -sacrum 1 decompression  . MANDIBLE RECONSTRUCTION  89   upper anfd lower jaw cyst  . NASAL SINUS SURGERY     X 2 at age 39 & 22    There were no vitals filed for this visit.      Subjective Assessment - 11/26/16 1123    Subjective I worked cardio today at the gym.  i did not sleep at all,  my dog was sick.  I have not had any hip pain a week or more.     Currently in Pain? No/denies   Pain Score 3   stiff today ,  yesterday 3/10   Pain Location Back   Pain Orientation Lower   Pain Descriptors / Indicators --  stiff   Pain Type Chronic pain   Pain Frequency Intermittent   Aggravating Factors  sitting still,  too long,  long car ride to [facility]   Pain Relieving Factors standing,  shifting weight    Multiple Pain Sites --  Knee right 4-5/10 pain ,  new pain ,  better with ice                         [facility] Adult PT Treatment/Exercise - 11/26/16 0001      Lumbar Exercises: Supine   Clam 10 reps  green  band   Clam Limitations also 10 x with ball squeeze and abdominal bracing,  fatigued   Large Ball Abdominal Isometric 5 reps   Large Ball Abdominal Isometric Limitations fatigued ,  cues   Large Ball Oblique Isometric 5 reps   Large Ball Oblique Isometric Limitations fatigued     Lumbar Exercises: Quadruped   Single Arm Raise Weights (lbs) 3   Straight Leg Raises Limitations 3                     PT Long Term Goals - 11/26/16 1143      PT LONG TERM GOAL #1   Title independent with HEP   Baseline independent with exercises so far,  she has not been consistant with her core exercises.     Time 6   Period Weeks   Status On-going     PT LONG TERM GOAL #2   Title demonstrate proper lifting/squatting techniques in order to safely perform community fitness exercises   Baseline able to demo good technique and it was easy for her to do   Time 6   Period Weeks   Status Achieved     PT LONG TERM GOAL #3   Title report  pain < 3/10 for improved function and mobility   Baseline no more than 3/10.  Consistant   Time 6   Period Weeks   Status Partially Met     PT LONG TERM GOAL #4   Title perform lumbar ROM without increase in pain for improved function and mobility   Time 6   Period Weeks   Status Unable to assess               Plan - 11/26/16 1141    Clinical Impression Statement Stiff vs pain today.  Hip pain resolved. She as moving slow today due to lack of sleep? Her dog has been sick and she has not been sleeping well.    PT Treatment/Interventions ADLs/Self Care Home Management;Cryotherapy;Electrical Stimulation;Moist Heat;Traction;Ultrasound;Therapeutic exercise;Therapeutic activities;Functional mobility training;Gait training;Stair training;Patient/family education;Manual techniques;Passive range of motion;Dry needling;Taping   PT Next Visit Plan stretching, lumbopelvic strengthening, modalities PRN.  Review hip ER stretches.   Consider quadriped/ ball exercises.   PT Home Exercise Plan hip ER/Groin stretch, HSS with strap, thomas test stretch, bridge with pillow, plank-elbow & knees, prone hip ext, sidelying hip abd; Hip ER stretching.  sitting or supine with feet together   Consulted and Agree with Plan of Care Patient      Patient will benefit from skilled therapeutic intervention in order to improve the following deficits and impairments:     Visit Diagnosis: Acute left-sided low back pain without sciatica  Abnormal posture  Other muscle spasm  Weakness of trunk musculature  Stiffness of joint of right pelvic region and thigh     Problem List Patient Active Problem List   Diagnosis Date Noted  . Folliculitis 84/13/2440  . BPPV (benign paroxysmal positional vertigo) 03/01/2016  . Opioid dependence ([place]) 02/20/2016  . Post traumatic stress disorder (PTSD) 11/09/2015  . Hereditary and idiopathic peripheral neuropathy 09/29/2015  . Bipolar disorder ([place]) 07/04/2015  .  Vaginitis 05/06/2015  . Idiopathic hypotension 02/03/2015  . At risk for abnormal blood glucose level 12/15/2013  . Preventative health care 12/19/2011  . Fibromyalgia 06/26/2010  . Insomnia 06/26/2010  . Chronic fatigue and depression 06/26/2010  . Obesity 01/17/2010    Class: Chronic  . Lumbar disc herniation of L5-S1 on the left side (  MRI 2012 s/p surgery)  02/26/2008  . Allergic rhinitis 07/24/2006  . Gastroesophageal reflux disease 03/04/2006  . 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    HARRIS,KAREN PTA 11/26/2016, 11:45 AM  [place], [place], [address] Phone: [phone]   Fax:  [phone]  Name: Jordan [name] MRN: [id number] Date of Birth: Nov 22, 1980  PHYSICAL THERAPY DISCHARGE SUMMARY  Visits from Start of Care: 8  Current functional level related to goals / functional outcomes: See above   Remaining deficits: See above   Education / Equipment: Anatomy of condition, POC, HEP, exercise form/rationale  Plan: Patient agrees to discharge.  Patient goals were partially met. Patient is being discharged due to not returning since the last visit.  ?????    Pt cancelled last appointment due to transportation issues. Is now out of POC and will require a new referral to return to [place]. [facility] PT, DPT 12/25/16 5:58 PM

## 2016-11-28 ENCOUNTER — Ambulatory Visit: Payer: Medicare Other | Admitting: Physical Therapy

## 2016-11-30 ENCOUNTER — Ambulatory Visit (INDEPENDENT_AMBULATORY_CARE_PROVIDER_SITE_OTHER): Payer: Medicare Other | Admitting: Internal Medicine

## 2016-11-30 ENCOUNTER — Encounter: Payer: Self-pay | Admitting: Internal Medicine

## 2016-11-30 VITALS — BP 88/58 | HR 61 | Temp 98.0°F | Wt 242.2 lb

## 2016-11-30 DIAGNOSIS — Z23 Encounter for immunization: Secondary | ICD-10-CM | POA: Diagnosis not present

## 2016-11-30 DIAGNOSIS — M25561 Pain in right knee: Secondary | ICD-10-CM | POA: Insufficient documentation

## 2016-11-30 DIAGNOSIS — M797 Fibromyalgia: Secondary | ICD-10-CM

## 2016-11-30 DIAGNOSIS — M542 Cervicalgia: Secondary | ICD-10-CM | POA: Diagnosis not present

## 2016-11-30 MED ORDER — PREGABALIN 150 MG PO CAPS: 150.0000 mg | ORAL_CAPSULE | Freq: Two times a day (BID) | ORAL | 3 refills | Status: DC

## 2016-11-30 MED ORDER — MELOXICAM 7.5 MG PO TABS: 7.5000 mg | ORAL_TABLET | Freq: Every day | ORAL | 0 refills | Status: DC

## 2016-11-30 NOTE — Assessment & Plan Note (Signed)
Patient presents today with acute onset right knee pain and swelling that started 1-2 weeks ago. States the pain is constant and aggravated with movement. She has been trying eyes with significant improvement in swelling however states swelling reoccurs and she stops using ice. Has not tried anything else at home. Continues to work out almost every day. Partner is present in the room and states that she is running 3 times a week and playing kickball and softball as well. She has noticed patient is now limping. Patient denies recent trauma or injury to this knee. Denies fever, chills, and new erythema of the right knee. On exam patient's right knee feels warm to the touch and passive range of motion induces significant pain but she has full range of motion of right knee. No erythema noted. There is gross swelling on the medial aspect of the knee. POC ultrasound revealed no effusions. Suspect patient's symptoms are due to inflammatory changes in the knee secondary to recent increase in physical activity. Low suspicion for infection at this time as patient does not report signs/symptoms of systemic infection. She is afebrile and hypotension though she has a history of idiopathic hypotension.  - Short course of meloxicam 7.5 MG daily for pain and inflammation  - Continue using ice. Also recommended knee brace for knee support. - Recommended limiting physical activity while inflammation improves/resolves - Advised patient to call [facility] or go to the ED if she starts to experience fever, chills, worsening pain, inability to walk, and new erythema of the right knee

## 2016-11-30 NOTE — Assessment & Plan Note (Signed)
Patient presents for fibromyalgia follow-up. She is currently taking Lyrica 150 MG twice a day and reports compliance. States symptoms have been well controlled with this medication. She has recently started exercising about 1-2 months ago and this has also provided relief of symptoms. Her only complaint today is right knee pain and swelling that started 1-2 weeks ago. Please see assessment and plan for knee pain for further details.  - Refilled Lyrica

## 2016-11-30 NOTE — Progress Notes (Signed)
   CC: right knee pain and fibromyalgia follow-up  HPI:  [name] is a 36 y.o. female with history as described below who presents with acute onset of right knee pain and fibromyalgia follow-up. Please see problem base assessment and plan for further details.  Past Medical History:  Diagnosis Date  . Allergic rhinitis    pt takes flonase for episodes, no episodes in 2012  . Anxiety   . Basal cell carcinoma    recurrent in left maxillary, has had 5 surguries, last one 2003  . Basal cell nevus syndrome    dx age 14  . Bipolar 1 disorder ([place])   . Candidiasis, vagina    recurrent, pt has made lifestyle modifications and none recently (as of 8/12)  . Cyst of nasal sinus   . Fibroma    left ovarian  . Fibromyalgia   . Gardnerella infection    + in 08/08  . GERD (gastroesophageal reflux disease)    occ  . Herniated disc   . OVARIAN CYSTECTOMY, HX OF 03/01/2006   ovarian fibroma-left 1996, L ovary and fallopian tube removed    . Paronychia of third finger of left hand 07/28/2010  . PLANTAR FASCIITIS, BILATERAL 05/24/2009  . PTSD (post-traumatic stress disorder)   . Whitlow    hx of herpetic requiring I+D,( was bitten by autistic child that cares fr   Review of Systems:   Review of Systems  Constitutional: Negative for chills, fever and malaise/fatigue.  Respiratory: Negative for shortness of breath.   Cardiovascular: Negative for chest pain, palpitations and leg swelling.  Musculoskeletal: Positive for joint pain and myalgias. Negative for falls.       R knee pain    Neurological: Negative for focal weakness and weakness.  All other systems reviewed and are negative.    Physical Exam:  Vitals:   11/30/16 1556  BP: (!) 88/58  Pulse: 61  Temp: 98 F (36.7 C)  TempSrc: Oral  SpO2: 100%  Weight: 242 lb 3.2 oz (109.9 kg)   General: Pleasant female, obese, well-developed, in no acute distress Cardiac: regular rate and rhythm, nl S1/S2, no murmurs, rubs or gallops   Pulm: CTAB, no wheezes or crackles, no increased work of breathing  Neuro: A&Ox3, able to move all 4 extremities, no focal deficits noted Ext: warm and well perfused, no peripheral edema bilaterally R knee: Right knee feels warm to the touch and passive range of motion induces significant pain but she has full range of motion of right knee. No erythema noted. There is gross swelling on the medial aspect of the knee. No abrasions or signs of injury noted. No abnormalities observed on the left knee.    Assessment & Plan:   See Encounters Tab for problem based charting.  Patient seen with Dr. Angelia Mould

## 2016-11-30 NOTE — Patient Instructions (Signed)
It was nice to see you again, Ms. Bargar.  Your knee pain is likely been caused by inflammation due to increased exercise activity. Continue to use ice for the inflammation and swelling. Take 1 tablet of meloxicam daily, this will also help with the inflammation and pain. You can also purchase a knee brace or sleeve at any drugstore, this will help keep your knee supported.   You should expect to see improvement in knee pain in about 2 weeks, but he can take up to 4-6 weeks. You can continue exercising, but try to limit your physical activity during this time.   Symptoms to watch out for: fever, chills, worsening knee pain, new redness in the right knee, and inability to walk. Please call the internal medicine clinic or go to the ED if you notice any of the symptoms.

## 2016-12-02 ENCOUNTER — Other Ambulatory Visit: Payer: Self-pay | Admitting: Internal Medicine

## 2016-12-02 DIAGNOSIS — M797 Fibromyalgia: Secondary | ICD-10-CM

## 2016-12-03 NOTE — Telephone Encounter (Signed)
I just saw her last Friday and gave her a 1 month supply of Mobic for acute R knee pain that seemed to be secondary inflammation. She does not need a 90 day supply. If she is still in pain after 4 weeks on Mobic she will need to make an appt with me or at [facility] to reassess knee.

## 2016-12-03 NOTE — Progress Notes (Signed)
Internal Medicine Clinic Attending  I saw and evaluated the patient.  I personally confirmed the key portions of the history and exam documented by Dr. Isac Sarna and I reviewed pertinent patient test results.  The assessment, diagnosis, and plan were formulated together and I agree with the documentation in the resident's note. Agree there is mild increased warmth of right knee compared to left. No ligamentous instability, negative McMurray test, appeared to have some increased swelling on medial tibial plateau but POCUS did not reveal effusion on fat pad. Suspect this is acute inflammation from overuse/obesity, discussed RICE and NSAID treatment with return precautions.

## 2016-12-05 ENCOUNTER — Other Ambulatory Visit: Payer: Self-pay | Admitting: Internal Medicine

## 2016-12-05 DIAGNOSIS — M797 Fibromyalgia: Secondary | ICD-10-CM

## 2016-12-06 NOTE — Telephone Encounter (Signed)
Patient does not need a refill of this medication. She was only supposed to take a short course of it for pain. I did send a new prescription to her pharmacy when I saw her 1 week ago for inflammation of her R knee.

## 2016-12-17 ENCOUNTER — Encounter (HOSPITAL_COMMUNITY): Payer: Self-pay | Admitting: Psychiatry

## 2016-12-17 ENCOUNTER — Ambulatory Visit (INDEPENDENT_AMBULATORY_CARE_PROVIDER_SITE_OTHER): Payer: Medicare Other | Admitting: Psychiatry

## 2016-12-17 VITALS — BP 122/70 | HR 59 | Ht 69.0 in | Wt 242.6 lb

## 2016-12-17 DIAGNOSIS — M25569 Pain in unspecified knee: Secondary | ICD-10-CM | POA: Diagnosis not present

## 2016-12-17 DIAGNOSIS — Z813 Family history of other psychoactive substance abuse and dependence: Secondary | ICD-10-CM

## 2016-12-17 DIAGNOSIS — Z818 Family history of other mental and behavioral disorders: Secondary | ICD-10-CM | POA: Diagnosis not present

## 2016-12-17 DIAGNOSIS — F3132 Bipolar disorder, current episode depressed, moderate: Secondary | ICD-10-CM

## 2016-12-17 DIAGNOSIS — Z62810 Personal history of physical and sexual abuse in childhood: Secondary | ICD-10-CM

## 2016-12-17 DIAGNOSIS — F419 Anxiety disorder, unspecified: Secondary | ICD-10-CM

## 2016-12-17 DIAGNOSIS — Z811 Family history of alcohol abuse and dependence: Secondary | ICD-10-CM | POA: Diagnosis not present

## 2016-12-17 MED ORDER — DIVALPROEX SODIUM ER 250 MG PO TB24: 750.0000 mg | ORAL_TABLET | Freq: Every day | ORAL | 1 refills | Status: DC

## 2016-12-17 MED ORDER — HYDROXYZINE HCL 25 MG PO TABS: ORAL_TABLET | ORAL | 0 refills | Status: DC
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
## 2017-01-15 ENCOUNTER — Other Ambulatory Visit: Payer: Self-pay

## 2017-01-15 ENCOUNTER — Ambulatory Visit (INDEPENDENT_AMBULATORY_CARE_PROVIDER_SITE_OTHER): Payer: Medicare Other | Admitting: Internal Medicine

## 2017-01-15 ENCOUNTER — Encounter: Payer: Self-pay | Admitting: Internal Medicine

## 2017-01-15 DIAGNOSIS — Z6835 Body mass index (BMI) 35.0-35.9, adult: Secondary | ICD-10-CM

## 2017-01-15 DIAGNOSIS — E669 Obesity, unspecified: Secondary | ICD-10-CM | POA: Diagnosis not present

## 2017-01-15 DIAGNOSIS — M25561 Pain in right knee: Secondary | ICD-10-CM

## 2017-01-15 MED ORDER — MELOXICAM 7.5 MG PO TABS: 7.5000 mg | ORAL_TABLET | Freq: Every day | ORAL | 3 refills | Status: DC

## 2017-01-15 NOTE — Patient Instructions (Signed)
Ms. Kita,  Arloa Koh try the mobic and physical therapy and see if that can help get you through this injury. I would like for you to follow up with Dr. Frederico Hamman in January for re-evaluation.
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
## 2017-01-16 ENCOUNTER — Encounter (HOSPITAL_COMMUNITY): Payer: Self-pay | Admitting: Licensed Clinical Social Worker

## 2017-01-16 ENCOUNTER — Ambulatory Visit (INDEPENDENT_AMBULATORY_CARE_PROVIDER_SITE_OTHER): Payer: Medicare Other | Admitting: Licensed Clinical Social Worker

## 2017-01-16 DIAGNOSIS — F3162 Bipolar disorder, current episode mixed, moderate: Secondary | ICD-10-CM

## 2017-01-16 NOTE — Assessment & Plan Note (Signed)
Story most concerning for possible meniscal injury though examination largely benign with the exception of mild warmth and medial edema. Will recommend course of higher dose NSAIDs than ibuprofen 200 mg and will send in new Rx for the mobic 7.5 mg daily previously prescribed but never received. Since she has had continued symptoms for ~2 months, will refer to PT. If no improvement with PT would consider MRI at that time for further evaluation.

## 2017-01-16 NOTE — Progress Notes (Signed)
   THERAPIST PROGRESS NOTE  Session Time: 3:40pm-4:30pm  Participation Level: Active  Behavioral Response: NeatAlertEuthymic  Type of Therapy: Individual Therapy  Treatment Goals addressed: improve psychiatric symptoms, controlled behavior, moderate mood, and deliberate speech (improved social and work functioning and decrease impulsive behavior), improve unhelpful thought patterns, elevate mood (increased hopefulness and social interactions), interpersonal relationship skills, discuss and process trauma, learn about diagnosis, healthy coping skills  Interventions: Motivational Interviewing, CBT, Grounding and Mindfulness Techniques   Summary: AMRUTHA AVERA is a 36 y.o. female who presents with Moderate mixed bipolar I disorder   Suicidal/Homicidal: No -without intent/plan  Therapist Response:  Seven met with clinician for an individual session. Carlyann discussed her psychiatric symptoms, her current life events and her homework. Ziasia shared that she has continued to have a positive relationship with father. She reports he has been stable and more understanding over the past several months. Jensen also reported that her father found out about her brother and wants to meet him. Avon reports she will broach that subject in person due to concerns about brother's reaction. Lavaughn also identified concerns about girlfriend and some problems over the weekend. Clinician utilized MI OARS to reflect and summarize thoughts and feelings. Clinician noted that Kinzleigh continues to manage her emotions well and knows how to walk away before she blows up. Clinician reminded Briawna that girlfriend is just starting the process of therapy and has not yet learned how to use CBT in daily life.   Plan: Return again in 2 week.  Diagnosis: Axis I: Moderate mixed bipolar I disorder   [name], LCSW 01/16/2017

## 2017-01-16 NOTE — Progress Notes (Signed)
Internal Medicine Clinic Attending  Case discussed with Dr. Boswell at the time of the visit.  We reviewed the resident's history and exam and pertinent patient test results.  I agree with the assessment, diagnosis, and plan of care documented in the resident's note.  

## 2017-01-28 ENCOUNTER — Ambulatory Visit: Payer: Medicare Other | Admitting: Physical Therapy

## 2017-01-30 ENCOUNTER — Ambulatory Visit (INDEPENDENT_AMBULATORY_CARE_PROVIDER_SITE_OTHER): Payer: Medicare Other | Admitting: Licensed Clinical Social Worker

## 2017-01-30 ENCOUNTER — Encounter (HOSPITAL_COMMUNITY): Payer: Self-pay | Admitting: Licensed Clinical Social Worker

## 2017-01-30 DIAGNOSIS — F3162 Bipolar disorder, current episode mixed, moderate: Secondary | ICD-10-CM

## 2017-01-30 NOTE — Progress Notes (Signed)
   THERAPIST PROGRESS NOTE  Session Time: 2:30pm-3:30pm  Participation Level: Active  Behavioral Response: NeatAlertEuthymic  Type of Therapy: Individual Therapy  Treatment Goals addressed: improve psychiatric symptoms, controlled behavior, moderate mood, and deliberate speech (improved social and work functioning and decrease impulsive behavior), improve unhelpful thought patterns, elevate mood (increased hopefulness and social interactions), interpersonal relationship skills, discuss and process trauma, learn about diagnosis, healthy coping skills  Interventions: Motivational Interviewing, CBT, Grounding and Mindfulness Techniques   Summary: Jordan [name] is a 36 y.o. female who presents with Moderate mixed bipolar I disorder   Suicidal/Homicidal: No -without intent/plan  Therapist Response:  Jordan [name] met with clinician for an individual session. Jordan [name] discussed her psychiatric symptoms, her current life events and her homework. Jordan [name] shared ongoing concerns about her relationship with girlfriend. Clinician processed recent problems over the snow days and noted Jordan [name]'s need for her own space. Clinician explored Jordan [name]'s thoughts and feelings about the relationship, explored what is going well and what is problematic, and discussed concerns about where they are in the process of therapy. Jordan [name] noted that since girlfriend is just now starting this process, the "getting worse" part is happening, whereas Jordan [name] is in the "getting better" phase of therapy. Clinician and Jordan [name] continued to process options and discussed ways to improve communication, which appears to be a significant problem.   Plan: Return again in 2 weeks.  Diagnosis: Axis I: Moderate mixed bipolar I disorder   [name], LCSW 01/30/2017

## 2017-02-05 ENCOUNTER — Ambulatory Visit: Payer: Medicare Other | Attending: Orthopedic Surgery

## 2017-02-05 DIAGNOSIS — R600 Localized edema: Secondary | ICD-10-CM | POA: Diagnosis not present

## 2017-02-05 DIAGNOSIS — R262 Difficulty in walking, not elsewhere classified: Secondary | ICD-10-CM | POA: Diagnosis not present

## 2017-02-05 DIAGNOSIS — M25561 Pain in right knee: Secondary | ICD-10-CM | POA: Insufficient documentation
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
## 2017-02-13 ENCOUNTER — Ambulatory Visit (HOSPITAL_COMMUNITY): Payer: Self-pay | Admitting: Licensed Clinical Social Worker

## 2017-02-20 ENCOUNTER — Ambulatory Visit: Payer: Medicare Other | Attending: Orthopedic Surgery

## 2017-02-20 DIAGNOSIS — R262 Difficulty in walking, not elsewhere classified: Secondary | ICD-10-CM | POA: Insufficient documentation

## 2017-02-20 DIAGNOSIS — R600 Localized edema: Secondary | ICD-10-CM | POA: Insufficient documentation

## 2017-02-20 DIAGNOSIS — M25561 Pain in right knee: Secondary | ICD-10-CM | POA: Diagnosis not present
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
## 2017-02-20 NOTE — Patient Instructions (Signed)

## 2017-02-25 ENCOUNTER — Ambulatory Visit: Payer: Medicare Other | Admitting: Physical Therapy

## 2017-02-25 ENCOUNTER — Encounter: Payer: Self-pay | Admitting: Physical Therapy

## 2017-02-25 DIAGNOSIS — R600 Localized edema: Secondary | ICD-10-CM

## 2017-02-25 DIAGNOSIS — M25561 Pain in right knee: Secondary | ICD-10-CM

## 2017-02-25 DIAGNOSIS — R262 Difficulty in walking, not elsewhere classified: Secondary | ICD-10-CM
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
## 2017-02-27 ENCOUNTER — Ambulatory Visit (INDEPENDENT_AMBULATORY_CARE_PROVIDER_SITE_OTHER): Payer: Medicare Other | Admitting: Psychiatry

## 2017-02-27 ENCOUNTER — Encounter (HOSPITAL_COMMUNITY): Payer: Self-pay | Admitting: Psychiatry

## 2017-02-27 ENCOUNTER — Encounter: Payer: Self-pay | Admitting: Physical Therapy

## 2017-02-27 ENCOUNTER — Ambulatory Visit: Payer: Medicare Other | Admitting: Physical Therapy

## 2017-02-27 VITALS — BP 122/74 | HR 62 | Ht 69.0 in | Wt 237.0 lb

## 2017-02-27 DIAGNOSIS — F419 Anxiety disorder, unspecified: Secondary | ICD-10-CM

## 2017-02-27 DIAGNOSIS — M25561 Pain in right knee: Secondary | ICD-10-CM | POA: Diagnosis not present

## 2017-02-27 DIAGNOSIS — Z811 Family history of alcohol abuse and dependence: Secondary | ICD-10-CM

## 2017-02-27 DIAGNOSIS — Z818 Family history of other mental and behavioral disorders: Secondary | ICD-10-CM | POA: Diagnosis not present

## 2017-02-27 DIAGNOSIS — Z79899 Other long term (current) drug therapy: Secondary | ICD-10-CM | POA: Diagnosis not present

## 2017-02-27 DIAGNOSIS — Z813 Family history of other psychoactive substance abuse and dependence: Secondary | ICD-10-CM | POA: Diagnosis not present

## 2017-02-27 DIAGNOSIS — R262 Difficulty in walking, not elsewhere classified: Secondary | ICD-10-CM | POA: Diagnosis not present

## 2017-02-27 DIAGNOSIS — F3132 Bipolar disorder, current episode depressed, moderate: Secondary | ICD-10-CM

## 2017-02-27 DIAGNOSIS — R600 Localized edema: Secondary | ICD-10-CM

## 2017-02-27 MED ORDER — DIVALPROEX SODIUM ER 250 MG PO TB24: 750.0000 mg | ORAL_TABLET | Freq: Every day | ORAL | 2 refills | Status: DC

## 2017-02-27 MED ORDER — HYDROXYZINE HCL 25 MG PO TABS: ORAL_TABLET | ORAL | 2 refills | Status: DC
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
## 2017-02-28 ENCOUNTER — Ambulatory Visit (HOSPITAL_COMMUNITY): Payer: Self-pay | Admitting: Licensed Clinical Social Worker

## 2017-02-28 LAB — VALPROIC ACID LEVEL: Valproic Acid Lvl: 46 ug/mL — ABNORMAL LOW (ref 50–100)

## 2017-02-28 LAB — HEMOGLOBIN A1C
Est. average glucose Bld gHb Est-mCnc: 108 mg/dL
Hgb A1c MFr Bld: 5.4 % (ref 4.8–5.6)

## 2017-03-04 ENCOUNTER — Ambulatory Visit: Payer: Medicare Other | Admitting: Physical Therapy

## 2017-03-04 ENCOUNTER — Encounter: Payer: Self-pay | Admitting: Physical Therapy

## 2017-03-04 DIAGNOSIS — M25561 Pain in right knee: Secondary | ICD-10-CM

## 2017-03-04 DIAGNOSIS — R262 Difficulty in walking, not elsewhere classified: Secondary | ICD-10-CM

## 2017-03-04 DIAGNOSIS — R600 Localized edema: Secondary | ICD-10-CM | POA: Diagnosis not present
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
## 2017-03-04 NOTE — Patient Instructions (Signed)
Abduction: Clam (Eccentric) - Side-Lying    Lie on side with knees bent. Lift top knee, keeping feet together. Keep trunk steady. Slowly lower for 3-5 seconds. _10__ reps per set, 1-3___ sets per day, _3-5__ days per week. Proprioception, Quad Strength, Coordination: Side Step-Up    Step up sideways with one foot, then the other. Step off other side the same way. Use _6-8___ inch step. Repeat _10- 30___ times . Do 1____ sessions per day.  http://cc.exer.us/6   Copyright  VHI. All rights reserved.  Proprioception, Quad Strength, Timing, Coordination: Forward Step-Up    Move onto step with one foot, then the other. Step back off the same way. Use ___6-8_ inch step. Repeat __10-30__ times. Do 1____ sessions per day.  http://cc.exer.us/4   Copyright  VHI. All rights reserved.    http://ecce.exer.us/65   Copyright  VHI. All rights reserved.

## 2017-03-06 ENCOUNTER — Encounter: Payer: Self-pay | Admitting: Internal Medicine

## 2017-03-07 ENCOUNTER — Ambulatory Visit: Payer: Medicare Other

## 2017-03-07 ENCOUNTER — Encounter (HOSPITAL_COMMUNITY): Payer: Self-pay | Admitting: Licensed Clinical Social Worker

## 2017-03-07 ENCOUNTER — Ambulatory Visit (INDEPENDENT_AMBULATORY_CARE_PROVIDER_SITE_OTHER): Payer: Medicare Other | Admitting: Licensed Clinical Social Worker

## 2017-03-07 DIAGNOSIS — R600 Localized edema: Secondary | ICD-10-CM

## 2017-03-07 DIAGNOSIS — M25561 Pain in right knee: Secondary | ICD-10-CM

## 2017-03-07 DIAGNOSIS — F3162 Bipolar disorder, current episode mixed, moderate: Secondary | ICD-10-CM | POA: Diagnosis not present

## 2017-03-07 DIAGNOSIS — R262 Difficulty in walking, not elsewhere classified: Secondary | ICD-10-CM | POA: Diagnosis not present
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
## 2017-03-07 NOTE — Progress Notes (Signed)
   THERAPIST PROGRESS NOTE  Session Time: 2:30pm-3:20pm  Participation Level: Active  Behavioral Response: Well GroomedAlertIrritable  Type of Therapy: Individual Therapy  Treatment Goals addressed: improve psychiatric symptoms, controlled behavior, moderate mood, and deliberate speech (improved social and work functioning and decrease impulsive behavior), improve unhelpful thought patterns, elevate mood (increased hopefulness and social interactions), interpersonal relationship skills, discuss and process trauma, learn about diagnosis, healthy coping skills  Interventions: Motivational Interviewing, CBT, Grounding and Mindfulness Techniques   Summary: Jordan [name] is a 37 y.o. female who presents with Moderate mixed bipolar I disorder   Suicidal/Homicidal: No -without intent/plan  Therapist Response:  Jordan [name] met with clinician for an individual session. Jordan [name] discussed her psychiatric symptoms, her current life events and her homework. Jordan [name] shared that she has been very frustrated over the past several weeks due to problems in her relationship. She reports major communication issues that have led to a plan for separation in the coming weeks. Jordan [name] processed thoughts and feelings about being single again, pros and cons, and experiences in the past with being single. Jordan [name] identified interest in maintaining friendship and relationship with girlfriend's children, but realizes that they are triggering each other's emotional outbursts. Jordan [name] identified strengths of being able to maintain calmness in these stressful situations.   Plan: Return again in 2 weeks  Diagnosis: Axis I: Moderate mixed bipolar I disorder   [name], LCSW 03/07/2017

## 2017-03-11 ENCOUNTER — Ambulatory Visit: Payer: Medicare Other | Admitting: Physical Therapy

## 2017-03-14 ENCOUNTER — Ambulatory Visit (INDEPENDENT_AMBULATORY_CARE_PROVIDER_SITE_OTHER): Payer: Medicare Other | Admitting: Licensed Clinical Social Worker

## 2017-03-14 ENCOUNTER — Encounter (HOSPITAL_COMMUNITY): Payer: Self-pay | Admitting: Licensed Clinical Social Worker

## 2017-03-14 DIAGNOSIS — M797 Fibromyalgia: Secondary | ICD-10-CM | POA: Diagnosis not present

## 2017-03-14 DIAGNOSIS — Z630 Problems in relationship with spouse or partner: Secondary | ICD-10-CM | POA: Diagnosis not present

## 2017-03-14 DIAGNOSIS — F3162 Bipolar disorder, current episode mixed, moderate: Secondary | ICD-10-CM | POA: Diagnosis not present

## 2017-03-14 NOTE — Progress Notes (Signed)
   THERAPIST PROGRESS NOTE  Session Time: 2:40pm-3:30pm  Participation Level: Active  Behavioral Response: CasualLethargicDepressed  Type of Therapy: Individual Therapy  Treatment Goals addressed: improve psychiatric symptoms, controlled behavior, moderate mood, and deliberate speech (improved social and work functioning and decrease impulsive behavior), improve unhelpful thought patterns, elevate mood (increased hopefulness and social interactions), interpersonal relationship skills, discuss and process trauma, learn about diagnosis, healthy coping skills  Interventions: Motivational Interviewing, CBT, Grounding and Mindfulness Techniques   Summary: Jordan [name] is a 37 y.o. female who presents with Moderate mixed bipolar I disorder   Suicidal/Homicidal: No -without intent/plan  Therapist Response:  [name] met with clinician for an individual session. [name] discussed her psychiatric symptoms, her current life events and her homework. [name] shared that today has been a bad day for her fibromyalgia. She presented as lethargic and flat in affect. She reports she is under a great deal of stress due to her relationship and trying to end it and get her ex out of her home. [name] identified family supports that have helped in getting her out of the house and active. [name] also identified the use of a vision board in her home noting goals for 2019: travel, finances, relationships with family, health.  [name] processed ongoing problems getting along with her ex. Clinician provided a list of cognitive distortions and discussed how they intervene with schemas and core beliefs. [name] reports she is currently reading a book about being able to change her thoughts to change her life.   Plan: Return again in 2 weeks  Diagnosis: Axis I: Moderate mixed bipolar I disorder   [name], LCSW 03/14/2017

## 2017-03-22 ENCOUNTER — Ambulatory Visit (INDEPENDENT_AMBULATORY_CARE_PROVIDER_SITE_OTHER): Payer: Medicare Other | Admitting: Internal Medicine

## 2017-03-22 ENCOUNTER — Encounter: Payer: Self-pay | Admitting: Internal Medicine

## 2017-03-22 ENCOUNTER — Other Ambulatory Visit: Payer: Self-pay

## 2017-03-22 VITALS — BP 92/55 | HR 69 | Temp 98.4°F | Ht 69.0 in | Wt 231.6 lb

## 2017-03-22 DIAGNOSIS — Z79899 Other long term (current) drug therapy: Secondary | ICD-10-CM

## 2017-03-22 DIAGNOSIS — Z8489 Family history of other specified conditions: Secondary | ICD-10-CM | POA: Diagnosis not present

## 2017-03-22 DIAGNOSIS — M25561 Pain in right knee: Secondary | ICD-10-CM | POA: Diagnosis not present

## 2017-03-22 DIAGNOSIS — R1084 Generalized abdominal pain: Secondary | ICD-10-CM

## 2017-03-22 DIAGNOSIS — M797 Fibromyalgia: Secondary | ICD-10-CM

## 2017-03-22 MED ORDER — PREGABALIN 150 MG PO CAPS: 150.0000 mg | ORAL_CAPSULE | Freq: Two times a day (BID) | ORAL | 5 refills | Status: DC

## 2017-03-22 NOTE — Patient Instructions (Signed)
It was nice to see you today, Jordan [name].  I refilled your prescription for Lyrica.  Please keep a food diary of everything you eat for the next 2 weeks.  Please make sure to write down when your symptoms start after eating.  Bring this diary to your next follow-up appointment in 2 weeks.  Please call me if you have any questions.

## 2017-03-23 ENCOUNTER — Encounter: Payer: Self-pay | Admitting: Internal Medicine

## 2017-03-23 DIAGNOSIS — R1084 Generalized abdominal pain: Secondary | ICD-10-CM | POA: Insufficient documentation
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
## 2017-03-23 NOTE — Assessment & Plan Note (Signed)
Patient presents for follow-up of right knee pain. She was last seen on 11/28 at which time there was concern for meniscal injury and she was referred to physical therapy. She states her knee pain has dramatically improved since starting PT. She has decreased the amount of exercise she does at the gym but continues to play sports. On exam, she had normal range of motion and there is no warmth, edema or erythema. Patient would like to continue PT and exercise.   - Continue PT

## 2017-03-23 NOTE — Assessment & Plan Note (Signed)
Patient presents with diffuse abdominal pain that started 2 months ago. The pain started after she began taking Herbal[employer] supplements for weight loss. It is associated with flatulence and loose stools (denies diarrhea). She stopped taking this supplement 1 month ago but continues to experience pain. She describes the pain as cramping and intermittent. Always associated with food intake, but unable to specify which types of food trigger the pain. Denies nausea and vomiting, association with menstrual periods, and urinary symptoms. States most her family members are lactose-intolerant, but she has never been. She does report eating significant amounts of dairy products in her diet. She expresses concern for possibility of having colon cancer as she has a young cousin battling end-stage colon cancer. She has no family history of colon cancer in first degree relatives. She denies melena, hematochezia, and changes in stool caliber. Her abdominal exam is unremarkable. Discussed with patient her symptoms are consistent with lactose intolerance and asked her to keep a food diary for the next 2 weeks. She is to follow up in clinic and bring the food diary with her. Also discussed low suspicion for colon cancer and reviewed screening guidelines with her.   - Food diary x 2 weeks  - Follow up in 2 weeks

## 2017-03-23 NOTE — Assessment & Plan Note (Addendum)
Patient presents requesting refill for Cymbalta. She continues to have myalgias, but is overall feeling well. States she is very pleased with this medication and would like to continue taking it. She continues to practice sports and going to the gym which has helped with her myalgias as well.   - Refilled Lyrica  - Encouraged patient to continue exercising

## 2017-03-25 NOTE — Progress Notes (Signed)
Internal Medicine Clinic Attending  Case discussed with Dr. Santos at the time of the visit.  We reviewed the resident's history and exam and pertinent patient test results.  I agree with the assessment, diagnosis, and plan of care documented in the resident's note.    

## 2017-03-26 ENCOUNTER — Ambulatory Visit (INDEPENDENT_AMBULATORY_CARE_PROVIDER_SITE_OTHER): Payer: Medicare Other | Admitting: Licensed Clinical Social Worker

## 2017-03-26 DIAGNOSIS — F3162 Bipolar disorder, current episode mixed, moderate: Secondary | ICD-10-CM | POA: Diagnosis not present

## 2017-03-27 ENCOUNTER — Encounter (HOSPITAL_COMMUNITY): Payer: Self-pay | Admitting: Licensed Clinical Social Worker

## 2017-03-27 NOTE — Progress Notes (Signed)
   THERAPIST PROGRESS NOTE  Session Time: 3:30pm-4:30pm  Participation Level: Active  Behavioral Response: Well GroomedAlertAnxious   Type of Therapy: Individual Therapy  Treatment Goals addressed: improve psychiatric symptoms, controlled behavior, moderate mood, and deliberate speech (improved social and work functioning and decrease impulsive behavior), improve unhelpful thought patterns, elevate mood (increased hopefulness and social interactions), interpersonal relationship skills, discuss and process trauma, learn about diagnosis, healthy coping skills  Interventions: Motivational Interviewing, CBT, Grounding and Mindfulness Techniques   Summary: JAQUESHA BOROFF is a 37 y.o. female who presents with Moderate mixed bipolar I disorder   Suicidal/Homicidal: No -without intent/plan  Therapist Response:  Gioia met with clinician for an individual session. Leighanna discussed her psychiatric symptoms, her current life events and her homework. Evie shared that she continues to have trouble with her girlfriend, who has not yet moved out of the house. She reports the girlfriend is not speaking to her or her dog. She also notes that there has not been any movement toward the girlfriend getting her own apartment. Danuta reports she has been tasked with introducing her brother to her father.  Clinician identified ways to prepare, as well as strategies for time of day, location, etc. Clinician also noted the importance of Adajah's role as a "connector" and then to step out of the relationship, not getting involved in any discussions of past hx and expectations.   Plan: Return again in 2 weeks  Diagnosis: Axis I: Moderate mixed bipolar I disorder    [name], LCSW 03/27/2017

## 2017-04-05 ENCOUNTER — Ambulatory Visit (INDEPENDENT_AMBULATORY_CARE_PROVIDER_SITE_OTHER): Payer: Medicare Other | Admitting: Internal Medicine

## 2017-04-05 ENCOUNTER — Encounter: Payer: Self-pay | Admitting: Internal Medicine

## 2017-04-05 VITALS — BP 103/65 | HR 68 | Temp 97.9°F | Ht 69.0 in | Wt 230.2 lb

## 2017-04-05 DIAGNOSIS — Z09 Encounter for follow-up examination after completed treatment for conditions other than malignant neoplasm: Secondary | ICD-10-CM

## 2017-04-05 DIAGNOSIS — Z8719 Personal history of other diseases of the digestive system: Secondary | ICD-10-CM | POA: Diagnosis not present

## 2017-04-05 DIAGNOSIS — R1084 Generalized abdominal pain: Secondary | ICD-10-CM

## 2017-04-05 NOTE — Patient Instructions (Signed)
Jordan [name],   Try to limit your intake of dairy products as well as fast food meals as these seem to trigger your symptoms.   You can buy over the counter simethicone for the bloating an flatulence.   Please call us if you have any questions.

## 2017-04-05 NOTE — Assessment & Plan Note (Signed)
Patient presents for follow-up of generalized abdominal pain.  She brings her food diary with her today.  On review of diary, it appears that patient develops abdominal pain as well as bloating and loose stools after eating meals high in dairy content as well as fast food meals.  She states she realized this after 1 week of documenting her food intake in the diary and has been trying to limit dairy products in her diet. She has noticed an improvement in symptoms.  Today she reports no complaints.  Educated patient on low dairy diet and limiting fast food meals as these exacerbate her symptoms.  Recommended over-the-counter simethicone for bloating and flatulence.  - Continue to monitor - Over-the-counter simethicone - Return precautions discussed

## 2017-04-05 NOTE — Progress Notes (Signed)
Internal Medicine Clinic Attending  Case discussed with Dr. Santos-Sanchez at the time of the visit.  We reviewed the resident's history and exam and pertinent patient test results.  I agree with the assessment, diagnosis, and plan of care documented in the resident's note.    
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
## 2017-04-09 ENCOUNTER — Ambulatory Visit (INDEPENDENT_AMBULATORY_CARE_PROVIDER_SITE_OTHER): Payer: Medicare Other | Admitting: Licensed Clinical Social Worker

## 2017-04-09 ENCOUNTER — Encounter (HOSPITAL_COMMUNITY): Payer: Self-pay | Admitting: Licensed Clinical Social Worker

## 2017-04-09 DIAGNOSIS — F3162 Bipolar disorder, current episode mixed, moderate: Secondary | ICD-10-CM | POA: Diagnosis not present
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
## 2017-04-25 ENCOUNTER — Ambulatory Visit (HOSPITAL_COMMUNITY): Payer: Self-pay | Admitting: Licensed Clinical Social Worker

## 2017-05-09 ENCOUNTER — Encounter (HOSPITAL_COMMUNITY): Payer: Self-pay | Admitting: Licensed Clinical Social Worker

## 2017-05-09 ENCOUNTER — Ambulatory Visit (INDEPENDENT_AMBULATORY_CARE_PROVIDER_SITE_OTHER): Payer: Medicare Other | Admitting: Licensed Clinical Social Worker

## 2017-05-09 DIAGNOSIS — F3162 Bipolar disorder, current episode mixed, moderate: Secondary | ICD-10-CM | POA: Diagnosis not present

## 2017-05-09 NOTE — Progress Notes (Signed)
   THERAPIST PROGRESS NOTE  Session Time: 10:10am-11:00am  Participation Level: Active  Behavioral Response: Well GroomedAlertDepressed  Type of Therapy: Individual Therapy  Treatment Goals addressed: improve psychiatric symptoms, controlled behavior, moderate mood, and deliberate speech (improved social and work functioning and decrease impulsive behavior), improve unhelpful thought patterns, elevate mood (increased hopefulness and social interactions), interpersonal relationship skills, discuss and process trauma, learn about diagnosis, healthy coping skills  Interventions: Motivational Interviewing, CBT, Grounding and Mindfulness Techniques   Summary: Jordan [name] is a 36 y.o. female who presents with Moderate mixed bipolar I disorder   Suicidal/Homicidal: No -without intent/plan  Therapist Response:  [name] met with clinician for an individual session. [name] discussed her psychiatric symptoms, her current life events and her homework. [name] shared that her ex-girlfriend moved out of the apartment. However, she took a lot of [name]'s belongings with her. Clinician explored [name]'s reaction and noted that she was able to refrain from acting out, cursing her out, or being aggressive. Clinician explored coping skills and noted that medication management has been on point and she has been reaching out to her best friends for assistance. [name] also processed recent loss of cousin from cancer. Clinician utilized MI OARS to reflect and summarize thoughts and feelings. [name] also identified concerns about F, who may also have prostate cancer. [name] became emotional due to this possible loss, since F has just come into her life in the past 18 months. Clinician assisted in processing these emotional ups and downs and provided positive feedback about coping ability.   Plan: Return again in 2 weeks.  Diagnosis: Axis I: Moderate mixed bipolar I disorder     R ,  LCSW 05/09/2017  

## 2017-05-23 ENCOUNTER — Encounter (HOSPITAL_COMMUNITY): Payer: Self-pay | Admitting: Licensed Clinical Social Worker

## 2017-05-23 ENCOUNTER — Ambulatory Visit (INDEPENDENT_AMBULATORY_CARE_PROVIDER_SITE_OTHER): Payer: Medicare Other | Admitting: Licensed Clinical Social Worker

## 2017-05-23 DIAGNOSIS — F3162 Bipolar disorder, current episode mixed, moderate: Secondary | ICD-10-CM

## 2017-05-23 NOTE — Progress Notes (Signed)
   THERAPIST PROGRESS NOTE  Session Time: 9:10am-10:00am  Participation Level: Active  Behavioral Response: Well GroomedAlertEuthymic  Type of Therapy: Individual Therapy  Treatment Goals addressed: improve psychiatric symptoms, controlled behavior, moderate mood, and deliberate speech (improved social and work functioning and decrease impulsive behavior), improve unhelpful thought patterns, elevate mood (increased hopefulness and social interactions), interpersonal relationship skills, discuss and process trauma, learn about diagnosis, healthy coping skills  Interventions: Motivational Interviewing, CBT, Grounding and Mindfulness Techniques   Summary: Jordan [name] is a 37 y.o. female who presents with Moderate mixed bipolar I disorder   Suicidal/Homicidal: No -without intent/plan  Therapist Response:  Alejandro met with clinician for an individual session. Roseanna discussed her psychiatric symptoms, her current life events and her homework. Iysha shared that she has been continuing to take care of her grandmother, which is frustrating due to her mother and sister residing in the home. Clinician processed recent interactions with sister and mother related to caring for grandmother, which has only increased [name]'s frustration and urge to move away. Reginna reports she has started a new relationship with a woman from [place]. She identified concerns about the woman's hx of addiction, but noted that she had been sober for 2 years. Clinician encouraged Earlene to attend an NA group with her girlfriend, as well as Nar-Anon. Clinician encouraged Kimberlin to engage more in cognitive processing, rather than leading with her emotions due to not wanting to get hurt.    Plan: Return again in 2 weeks.  Diagnosis: Axis I: Moderate mixed bipolar I disorder   [name], LCSW 05/23/2017

## 2017-05-28 ENCOUNTER — Encounter: Payer: Self-pay | Admitting: Internal Medicine

## 2017-05-28 ENCOUNTER — Other Ambulatory Visit: Payer: Self-pay | Admitting: *Deleted

## 2017-05-29 ENCOUNTER — Encounter (HOSPITAL_COMMUNITY): Payer: Self-pay | Admitting: Psychiatry

## 2017-05-29 ENCOUNTER — Ambulatory Visit (INDEPENDENT_AMBULATORY_CARE_PROVIDER_SITE_OTHER): Payer: Medicare Other | Admitting: Psychiatry

## 2017-05-29 DIAGNOSIS — Z818 Family history of other mental and behavioral disorders: Secondary | ICD-10-CM | POA: Diagnosis not present

## 2017-05-29 DIAGNOSIS — F419 Anxiety disorder, unspecified: Secondary | ICD-10-CM | POA: Diagnosis not present

## 2017-05-29 DIAGNOSIS — F3132 Bipolar disorder, current episode depressed, moderate: Secondary | ICD-10-CM | POA: Diagnosis not present

## 2017-05-29 DIAGNOSIS — Z813 Family history of other psychoactive substance abuse and dependence: Secondary | ICD-10-CM

## 2017-05-29 DIAGNOSIS — Z811 Family history of alcohol abuse and dependence: Secondary | ICD-10-CM | POA: Diagnosis not present

## 2017-05-29 MED ORDER — HYDROXYZINE HCL 25 MG PO TABS: ORAL_TABLET | ORAL | 2 refills | Status: DC

## 2017-05-29 MED ORDER — METHOCARBAMOL 500 MG PO TABS: 500.0000 mg | ORAL_TABLET | Freq: Three times a day (TID) | ORAL | 0 refills | Status: DC

## 2017-05-29 MED ORDER — DIVALPROEX SODIUM ER 250 MG PO TB24: 750.0000 mg | ORAL_TABLET | Freq: Every day | ORAL | 2 refills | Status: DC
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
## 2017-06-06 ENCOUNTER — Encounter (HOSPITAL_COMMUNITY): Payer: Self-pay | Admitting: Licensed Clinical Social Worker

## 2017-06-06 ENCOUNTER — Ambulatory Visit (INDEPENDENT_AMBULATORY_CARE_PROVIDER_SITE_OTHER): Payer: Medicare Other | Admitting: Licensed Clinical Social Worker

## 2017-06-06 DIAGNOSIS — F3162 Bipolar disorder, current episode mixed, moderate: Secondary | ICD-10-CM | POA: Diagnosis not present
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
## 2017-06-25 ENCOUNTER — Ambulatory Visit (INDEPENDENT_AMBULATORY_CARE_PROVIDER_SITE_OTHER): Payer: Medicare Other | Admitting: Licensed Clinical Social Worker

## 2017-06-25 ENCOUNTER — Encounter (HOSPITAL_COMMUNITY): Payer: Self-pay | Admitting: Licensed Clinical Social Worker

## 2017-06-25 DIAGNOSIS — F3162 Bipolar disorder, current episode mixed, moderate: Secondary | ICD-10-CM
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
## 2017-07-04 ENCOUNTER — Ambulatory Visit (INDEPENDENT_AMBULATORY_CARE_PROVIDER_SITE_OTHER): Payer: Medicare Other | Admitting: Podiatry

## 2017-07-04 ENCOUNTER — Encounter: Payer: Self-pay | Admitting: Podiatry

## 2017-07-04 DIAGNOSIS — M722 Plantar fascial fibromatosis: Secondary | ICD-10-CM | POA: Diagnosis not present

## 2017-07-04 MED ORDER — TRIAMCINOLONE ACETONIDE 10 MG/ML IJ SUSP
10.0000 mg | Freq: Once | INTRAMUSCULAR | Status: AC
  Administered 2017-07-04: 10 mg

## 2017-07-04 NOTE — Progress Notes (Signed)
Subjective:   Patient ID: Jordan [name], female   DOB: 37 y.o.   MRN: [id number]   HPI Patient presents stating my heels have really been bothering me and it is worse when I get up in the morning or after periods of sitting   ROS      Objective:  Physical Exam  Neurovascular status intact with patient's heels being quite tender in the medial band bilateral with moderate flatfoot deformity that is present     Assessment:  Acute plantar fasciitis bilateral with foot structure is complicating factor     Plan:  H&P condition reviewed and injected the plantar fascial bilateral 3 mg Kenalog 5 mg Xylocaine and dispensed night splint with instructions on using it on one foot than the other and especially to use it in acute environment.  Reappoint to recheck

## 2017-07-09 ENCOUNTER — Encounter (HOSPITAL_COMMUNITY): Payer: Self-pay | Admitting: Licensed Clinical Social Worker

## 2017-07-09 ENCOUNTER — Ambulatory Visit (INDEPENDENT_AMBULATORY_CARE_PROVIDER_SITE_OTHER): Payer: Medicare Other | Admitting: Licensed Clinical Social Worker

## 2017-07-09 DIAGNOSIS — F3162 Bipolar disorder, current episode mixed, moderate: Secondary | ICD-10-CM | POA: Diagnosis not present

## 2017-07-09 NOTE — Progress Notes (Signed)
   THERAPIST PROGRESS NOTE  Session Time: 10:00am-11:00am  Participation Level: Active  Behavioral Response: Well GroomedAlertDysphoric  Type of Therapy: Individual Therapy  Treatment Goals addressed: improve psychiatric symptoms, controlled behavior, moderate mood, and deliberate speech (improved social and work functioning and decrease impulsive behavior), improve unhelpful thought patterns, elevate mood (increased hopefulness and social interactions), interpersonal relationship skills, discuss and process trauma, learn about diagnosis, healthy coping skills  Interventions: Motivational Interviewing, CBT, Grounding and Mindfulness Techniques   Summary: ELIZZIE WESTERGARD is a 37 y.o. female who presents with Moderate mixed bipolar I disorder   Suicidal/Homicidal: No -without intent/plan  Therapist Response:  Carey met with clinician for an individual session. Meeya discussed her psychiatric symptoms, her current life events and her homework. Deaira shared that her plantar fasciitis has been acting up, which triggers her fibromyalgia, leading to increased depressive sxs, irritability, and lack of motivation. Duane reports she had injections in her foot and is directed to wear a boot. However, she does not wear it out of the house. Clinician explored thoughts and feelings about physical pain and reflected the connections between physical health and mental health. Clinician discussed medication and encouraged Satsuki to contact her doctor about increasing Lyrica in order to adjust to current sxs. Clinician discussed family interactions and assisted in processing relationship with mother, sister, niece, and grandmother. Clinician also explored Leydi's thoughts about moving away, which would be good to get away from mom and sister, but very difficult to be away from grandmother.    Plan: Return again in 2 weeks.  Diagnosis: Axis I: Moderate mixed bipolar I disorder   [name],  LCSW 07/09/2017

## 2017-07-12 ENCOUNTER — Other Ambulatory Visit: Payer: Self-pay

## 2017-07-12 ENCOUNTER — Ambulatory Visit (INDEPENDENT_AMBULATORY_CARE_PROVIDER_SITE_OTHER): Payer: Medicare Other | Admitting: Internal Medicine

## 2017-07-12 VITALS — BP 107/66 | HR 62 | Temp 98.2°F | Wt 237.0 lb

## 2017-07-12 DIAGNOSIS — M79662 Pain in left lower leg: Secondary | ICD-10-CM | POA: Diagnosis not present

## 2017-07-12 DIAGNOSIS — M797 Fibromyalgia: Secondary | ICD-10-CM

## 2017-07-12 DIAGNOSIS — Z9181 History of falling: Secondary | ICD-10-CM | POA: Diagnosis not present

## 2017-07-12 DIAGNOSIS — M722 Plantar fascial fibromatosis: Secondary | ICD-10-CM | POA: Diagnosis not present

## 2017-07-12 DIAGNOSIS — W19XXXA Unspecified fall, initial encounter: Secondary | ICD-10-CM | POA: Insufficient documentation

## 2017-07-12 DIAGNOSIS — Z79899 Other long term (current) drug therapy: Secondary | ICD-10-CM | POA: Diagnosis not present

## 2017-07-12 MED ORDER — PREGABALIN 225 MG PO CAPS: 225.0000 mg | ORAL_CAPSULE | Freq: Two times a day (BID) | ORAL | 5 refills | Status: DC

## 2017-07-12 NOTE — Progress Notes (Signed)
Internal Medicine Clinic Attending  Case discussed with Dr. Melvin  at the time of the visit.  We reviewed the resident's history and exam and pertinent patient test results.  I agree with the assessment, diagnosis, and plan of care documented in the resident's note.  
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
## 2017-07-12 NOTE — Assessment & Plan Note (Signed)
Patient experienced a fall 3 days following an episode of calf pain. She denies hitting her head or loss of consciousness. She states she landed on her elbows. Denies any subsequent pain as a result of fall.

## 2017-07-12 NOTE — Assessment & Plan Note (Signed)
Patient presents following increased left calf pain. She reports an episode of severe left calf pain 3 days ago. The Pain is described as stabbing, burning, electric-like pain that radiated down to her foot. She states she fell to the ground as a result of being unable to put weight on the leg during the episode. The pain lasted for about 3 minutes and resolved on it's own. Massaging the leg helped seems to have helped some. Patient denies any crampy feeling. Patient has subsequently experienced similar episodes intermittently that have been less intense and shorter in duration.  Of note patient also being treated for plantar fasciitis by podiatry, which she states is under control and that this new pain is completely different from that pain.  On exam, no swelling or erythema, not tender. Patient did experience short episode during our visit that was less intense and resolve within 1-2 minutes. Suspect this is neuropathic pain related to the patients fibromyalgia. Will increase patient's Lyrica dose with increased neuropathic pain. - Increased Lyrica to 225mg  BID - Continue exersizes

## 2017-07-12 NOTE — Patient Instructions (Signed)
Thank you for allowing [place] to care for you  For the pain in your calf and foot - We are increasing your Lyrica to 225mg  Twice a day

## 2017-07-16 ENCOUNTER — Encounter: Payer: Self-pay | Admitting: Internal Medicine

## 2017-07-18 ENCOUNTER — Ambulatory Visit (INDEPENDENT_AMBULATORY_CARE_PROVIDER_SITE_OTHER): Payer: Medicare Other | Admitting: Internal Medicine

## 2017-07-18 ENCOUNTER — Encounter: Payer: Self-pay | Admitting: Internal Medicine

## 2017-07-18 DIAGNOSIS — Z8589 Personal history of malignant neoplasm of other organs and systems: Secondary | ICD-10-CM

## 2017-07-18 DIAGNOSIS — M722 Plantar fascial fibromatosis: Secondary | ICD-10-CM | POA: Diagnosis not present

## 2017-07-18 NOTE — Progress Notes (Signed)
Internal Medicine Clinic Attending  Case discussed with Dr. Harbrecht at the time of the visit.  We reviewed the resident's history and exam and pertinent patient test results.  I agree with the assessment, diagnosis, and plan of care documented in the resident's note.   

## 2017-07-18 NOTE — Patient Instructions (Addendum)
FOLLOW-UP INSTRUCTIONS When: if symptoms worsen or fail to improve  Please call your podiatrist and inform them of the issues.   I would recommend that you use the ice for periods of 34min at least 2 hours apart up to 4-5 times per day as tolerated.  Continue to use the boot at night. Continue stretching the foot and using the ball as previously described.   If you have any questions please feel free to call our clinic at any time.  Thank you for your visit to the [name] today.
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
## 2017-07-18 NOTE — Assessment & Plan Note (Addendum)
Bilateral plantar fasciitis with acute left sided flare:  Patient stated that the pain began abruptly following a two mile walk on 07/07/2017.  She had walked approximately 1.5 miles the previous day which is a significant increase over 1 mile daily average.  The pain was identical to her plantar fasciitis pain that she typically experiences and only occurred in the left foot.  She recently received bilateral injections which resolved the pain on the right and left feet.  The right foot pain did not recur.  This pain is improved minimally with the meloxicam and Lyrica and significantly with rest, stretching, and ice.  Pain is worse in the initial phase of stretching and with standing/walking. She continues to use the lyrica to some benefit but agrees that an increase would be of little use.  Patient denies fever, chills, nausea, vomiting, diarrhea, constipation, vomiting, chest pain, joint pain or visual changes.  Plan: Patient will need to see the specialist who injected her feet.  I recommended continuing ice, stretching, water aerobics, biking, and intermittent progressive walking.

## 2017-07-23 ENCOUNTER — Ambulatory Visit (HOSPITAL_COMMUNITY): Payer: Medicare Other | Admitting: Licensed Clinical Social Worker

## 2017-08-06 ENCOUNTER — Encounter (HOSPITAL_COMMUNITY): Payer: Self-pay | Admitting: Licensed Clinical Social Worker

## 2017-08-06 ENCOUNTER — Ambulatory Visit (INDEPENDENT_AMBULATORY_CARE_PROVIDER_SITE_OTHER): Payer: Medicare Other | Admitting: Licensed Clinical Social Worker

## 2017-08-06 DIAGNOSIS — F3162 Bipolar disorder, current episode mixed, moderate: Secondary | ICD-10-CM

## 2017-08-06 NOTE — Progress Notes (Signed)
   THERAPIST PROGRESS NOTE  Session Time: 10:10am-11:00am  Participation Level: Active  Behavioral Response: Well GroomedAlertDepressed  Type of Therapy: Individual Therapy  Treatment Goals addressed: improve psychiatric symptoms, controlled behavior, moderate mood, and deliberate speech (improved social and work functioning and decrease impulsive behavior), improve unhelpful thought patterns, elevate mood (increased hopefulness and social interactions), interpersonal relationship skills, discuss and process trauma, learn about diagnosis, healthy coping skills  Interventions: Motivational Interviewing, CBT, Grounding and Mindfulness Techniques   Summary: DYMPHNA WADLEY is a 37 y.o. female who presents with Moderate mixed bipolar I disorder   Suicidal/Homicidal: No -without intent/plan  Therapist Response:  Aymar met with clinician for an individual session. Rainn discussed her psychiatric symptoms, her current life events and her homework. Tuesday shared that she has been feeling stressed and overwhelmed because she is the "go to" person for her family. Kendyll processed her interactions with her mother, sister, grandmother, niece, and brother that have occurred over the past few weeks. She identified increased stress and tendency to go off on her girlfriend. Clinician explored use of coping skills using CBT. Clinician discussed past use of coping skills worksheets and provided the module from [employer] "Keeping your Balance" about coping skills for stress. Clinician discussed the importance of saying no to things that are not helpful. Clinician encouraged Camerin to step back a little bit and delegate some responsibilities to her sister, mother, and niece.   Plan: Return again in 2 weeks.  Diagnosis: Axis I: Moderate mixed bipolar I disorder   [name], LCSW 08/06/2017

## 2017-08-12 ENCOUNTER — Other Ambulatory Visit: Payer: Self-pay | Admitting: Internal Medicine

## 2017-08-20 ENCOUNTER — Ambulatory Visit (INDEPENDENT_AMBULATORY_CARE_PROVIDER_SITE_OTHER): Payer: Medicare Other | Admitting: Licensed Clinical Social Worker

## 2017-08-20 ENCOUNTER — Encounter (HOSPITAL_COMMUNITY): Payer: Self-pay | Admitting: Licensed Clinical Social Worker

## 2017-08-20 DIAGNOSIS — F3162 Bipolar disorder, current episode mixed, moderate: Secondary | ICD-10-CM | POA: Diagnosis not present

## 2017-08-20 NOTE — Progress Notes (Signed)
   THERAPIST PROGRESS NOTE  Session Time: 10:10am-11:00am  Participation Level: Active  Behavioral Response: Well GroomedAlertDepressed   Type of Therapy: Individual Therapy  Treatment Goals addressed: improve psychiatric symptoms, controlled behavior, moderate mood, and deliberate speech (improved social and work functioning and decrease impulsive behavior), improve unhelpful thought patterns, elevate mood (increased hopefulness and social interactions), interpersonal relationship skills, discuss and process trauma, learn about diagnosis, healthy coping skills  Interventions: Motivational Interviewing, CBT, Grounding and Mindfulness Techniques   Summary: Jordan [name] is a 37 y.o. female who presents with Moderate mixed bipolar I disorder   Suicidal/Homicidal: No -without intent/plan  Therapist Response:  Deena met with clinician for an individual session. Terah discussed her psychiatric symptoms, her current life events and her homework. Jaliya shared ongoing concerns and struggles with her family. She identified that every time she visits her grandmother, she gets "left" with the responsibility of caring for her. She processed different experiences where she was called in to help and then her sister and mother disappear. Littie also discussed her family's recent choices to talk badly about her. Clinician explored Heike's recent decision to start saying "no", which has upset her family, and has led to this badmouthing.  Clinician explored self-care and noted Danayah's plan for a new tattoo and recent purchase of some new clothes and shoes. Rubylee reports she has not bought herself anything new in months and she felt confident and good about her choices.   Plan: Return again in 2 week.  Diagnosis: Axis I: Moderate mixed bipolar I disorder   [name], LCSW 08/20/2017

## 2017-08-28 ENCOUNTER — Ambulatory Visit (HOSPITAL_COMMUNITY): Payer: Self-pay | Admitting: Psychiatry

## 2017-08-29 ENCOUNTER — Other Ambulatory Visit (HOSPITAL_COMMUNITY): Payer: Self-pay

## 2017-08-29 DIAGNOSIS — F3132 Bipolar disorder, current episode depressed, moderate: Secondary | ICD-10-CM

## 2017-08-29 MED ORDER — HYDROXYZINE HCL 25 MG PO TABS: ORAL_TABLET | ORAL | 1 refills | Status: DC

## 2017-08-29 MED ORDER — DIVALPROEX SODIUM ER 250 MG PO TB24: 750.0000 mg | ORAL_TABLET | Freq: Every day | ORAL | 1 refills | Status: DC

## 2017-09-03 ENCOUNTER — Ambulatory Visit (HOSPITAL_COMMUNITY): Payer: Self-pay | Admitting: Licensed Clinical Social Worker

## 2017-09-05 ENCOUNTER — Ambulatory Visit (INDEPENDENT_AMBULATORY_CARE_PROVIDER_SITE_OTHER): Payer: Medicare Other | Admitting: Licensed Clinical Social Worker

## 2017-09-05 ENCOUNTER — Encounter (HOSPITAL_COMMUNITY): Payer: Self-pay | Admitting: Licensed Clinical Social Worker

## 2017-09-05 DIAGNOSIS — F3162 Bipolar disorder, current episode mixed, moderate: Secondary | ICD-10-CM | POA: Diagnosis not present

## 2017-09-05 NOTE — Progress Notes (Signed)
   THERAPIST PROGRESS NOTE  Session Time: 1:30pm-2:30pm  Participation Level: Active  Behavioral Response: Well GroomedAlertEuthymic  Type of Therapy: Individual Therapy  Treatment Goals addressed: improve psychiatric symptoms, controlled behavior, moderate mood, and deliberate speech (improved social and work functioning and decrease impulsive behavior), improve unhelpful thought patterns, elevate mood (increased hopefulness and social interactions), interpersonal relationship skills, discuss and process trauma, learn about diagnosis, healthy coping skills  Interventions: Motivational Interviewing, CBT, Grounding and Mindfulness Techniques   Summary: HELLEN SHANLEY is a 37 y.o. female who presents with Moderate mixed bipolar I disorder   Suicidal/Homicidal: No -without intent/plan  Therapist Response:  Latonga met with clinician for an individual session. Kayli discussed her psychiatric symptoms, her current life events and her homework. Jolayne shared ongoing chaos with nearly every member of her family. Clinician processed concerns and problematic interactions with her father's girlfriend, mother, sister, and others. Clinician reflected the vast improvement in Geraldene's ability to cope with this level of chaos, especially due to the fact that everything seems to be happening simultaneously. Shamonica identified her girlfriend as a significant support and improvement in her ability to trust girlfriend with these concerns.   Plan: Return again in 2 weeks.  Diagnosis: Axis I: Moderate mixed bipolar I disorder   [name], LCSW 09/05/2017

## 2017-09-17 ENCOUNTER — Ambulatory Visit (HOSPITAL_COMMUNITY): Payer: Self-pay | Admitting: Licensed Clinical Social Worker

## 2017-09-17 ENCOUNTER — Encounter

## 2017-09-19 ENCOUNTER — Ambulatory Visit (INDEPENDENT_AMBULATORY_CARE_PROVIDER_SITE_OTHER): Payer: Medicare Other | Admitting: Licensed Clinical Social Worker

## 2017-09-19 ENCOUNTER — Encounter (HOSPITAL_COMMUNITY): Payer: Self-pay | Admitting: Licensed Clinical Social Worker

## 2017-09-19 DIAGNOSIS — F3162 Bipolar disorder, current episode mixed, moderate: Secondary | ICD-10-CM | POA: Diagnosis not present
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
## 2017-10-01 ENCOUNTER — Ambulatory Visit (HOSPITAL_COMMUNITY): Payer: Self-pay | Admitting: Licensed Clinical Social Worker

## 2017-10-02 ENCOUNTER — Ambulatory Visit (INDEPENDENT_AMBULATORY_CARE_PROVIDER_SITE_OTHER): Payer: Medicare Other | Admitting: Internal Medicine

## 2017-10-02 ENCOUNTER — Other Ambulatory Visit (HOSPITAL_COMMUNITY)
Admission: RE | Admit: 2017-10-02 | Discharge: 2017-10-02 | Disposition: A | Discharge status: Home / Self Care | Payer: Medicare Other | Source: Ambulatory Visit | Attending: Internal Medicine | Admitting: Internal Medicine

## 2017-10-02 ENCOUNTER — Other Ambulatory Visit: Payer: Self-pay

## 2017-10-02 VITALS — BP 97/58 | HR 66 | Temp 99.1°F | Wt 239.4 lb

## 2017-10-02 DIAGNOSIS — Z8742 Personal history of other diseases of the female genital tract: Secondary | ICD-10-CM | POA: Diagnosis not present

## 2017-10-02 DIAGNOSIS — N760 Acute vaginitis: Secondary | ICD-10-CM | POA: Insufficient documentation

## 2017-10-02 DIAGNOSIS — Z113 Encounter for screening for infections with a predominantly sexual mode of transmission: Secondary | ICD-10-CM | POA: Diagnosis not present

## 2017-10-02 DIAGNOSIS — B373 Candidiasis of vulva and vagina: Secondary | ICD-10-CM | POA: Insufficient documentation

## 2017-10-02 DIAGNOSIS — B9689 Other specified bacterial agents as the cause of diseases classified elsewhere: Secondary | ICD-10-CM | POA: Diagnosis not present

## 2017-10-02 DIAGNOSIS — N898 Other specified noninflammatory disorders of vagina: Secondary | ICD-10-CM | POA: Diagnosis present

## 2017-10-02 NOTE — Patient Instructions (Signed)
Ms. Minich,   We will let you know the results of your Pap smear soon as we have the results available.  Please worsen or if you have any questions or concerns in the meantime.  - Dr. Frederico Hamman

## 2017-10-03 ENCOUNTER — Encounter: Payer: Self-pay | Admitting: Internal Medicine

## 2017-10-03 ENCOUNTER — Encounter (HOSPITAL_COMMUNITY): Payer: Self-pay | Admitting: Licensed Clinical Social Worker

## 2017-10-03 ENCOUNTER — Ambulatory Visit (INDEPENDENT_AMBULATORY_CARE_PROVIDER_SITE_OTHER): Payer: Medicare Other | Admitting: Licensed Clinical Social Worker

## 2017-10-03 DIAGNOSIS — F3162 Bipolar disorder, current episode mixed, moderate: Secondary | ICD-10-CM

## 2017-10-03 DIAGNOSIS — N898 Other specified noninflammatory disorders of vagina: Secondary | ICD-10-CM | POA: Insufficient documentation

## 2017-10-03 LAB — RPR: RPR Ser Ql: NONREACTIVE

## 2017-10-03 LAB — HIV ANTIBODY (ROUTINE TESTING W REFLEX): HIV Screen 4th Generation wRfx: NONREACTIVE
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
## 2017-10-03 NOTE — Assessment & Plan Note (Signed)
Vaginal discharge: Jordan [name] presents with a 2-week history of thin white discharge that is not associated with a foul odor, itching, or vaginal pain. She has been using 2 pads per day due to significant amount of discharge. She reports having similar symptoms in the past when she was diagnosed with bacterial vaginosis. She recently broke up with her partner as she found out he has been using IV drugs and is concern he might have been unfaithful and might have be exposed to a STD. Pelvic exam performed. Significant amount of white, thin, sticky discharge throughout vaginal wall.  - Follow up wet prep  - Follow up GC/Chlamydia, HIV, and RPR

## 2017-10-04 ENCOUNTER — Other Ambulatory Visit: Payer: Self-pay | Admitting: Internal Medicine

## 2017-10-04 LAB — CERVICOVAGINAL ANCILLARY ONLY
Bacterial vaginitis: POSITIVE — AB
Candida vaginitis: POSITIVE — AB
Chlamydia: NEGATIVE
Neisseria Gonorrhea: NEGATIVE
Trichomonas: NEGATIVE

## 2017-10-04 MED ORDER — METRONIDAZOLE 500 MG PO TABS: 500.0000 mg | ORAL_TABLET | Freq: Two times a day (BID) | ORAL | 0 refills | Status: AC

## 2017-10-04 MED ORDER — FLUCONAZOLE 150 MG PO TABS: 150.0000 mg | ORAL_TABLET | Freq: Once | ORAL | 0 refills | Status: AC

## 2017-10-04 NOTE — Progress Notes (Signed)
Internal Medicine Clinic Attending  Case discussed with Dr. Santos-Sanchez at the time of the visit.  We reviewed the resident's history and exam and pertinent patient test results.  I agree with the assessment, diagnosis, and plan of care documented in the resident's note.    

## 2017-10-09 ENCOUNTER — Ambulatory Visit (HOSPITAL_COMMUNITY): Payer: Self-pay | Admitting: Psychiatry

## 2017-10-15 ENCOUNTER — Ambulatory Visit (HOSPITAL_COMMUNITY): Payer: Medicare Other | Admitting: Licensed Clinical Social Worker

## 2017-10-16 ENCOUNTER — Ambulatory Visit (INDEPENDENT_AMBULATORY_CARE_PROVIDER_SITE_OTHER): Payer: Medicare Other | Admitting: Licensed Clinical Social Worker

## 2017-10-16 ENCOUNTER — Encounter (HOSPITAL_COMMUNITY): Payer: Self-pay | Admitting: Licensed Clinical Social Worker

## 2017-10-16 DIAGNOSIS — F3162 Bipolar disorder, current episode mixed, moderate: Secondary | ICD-10-CM | POA: Diagnosis not present

## 2017-10-16 NOTE — Progress Notes (Signed)
   THERAPIST PROGRESS NOTE  Session Time: 2:30pm-3:30pm  Participation Level: Active  Behavioral Response: NeatAlertDepressed  Type of Therapy: Individual Therapy  Treatment Goals addressed: improve psychiatric symptoms, controlled behavior, moderate mood, and deliberate speech (improved social and work functioning and decrease impulsive behavior), improve unhelpful thought patterns, elevate mood (increased hopefulness and social interactions), interpersonal relationship skills, discuss and process trauma, learn about diagnosis, healthy coping skills  Interventions: Motivational Interviewing, CBT, Grounding and Mindfulness Techniques   Summary: Jordan [name] is a 37 y.o. female who presents with Moderate mixed bipolar I disorder   Suicidal/Homicidal: No -without intent/plan  Therapist Response:  Terren met with clinician for an individual session. [name] discussed her psychiatric symptoms, her current life events and her homework. [name] shared increased stress and worry about her grandmother, who is now having problems recognizing everyone, not eating, not walking, and not being cared for well. [name] processed changes in power of attorney and living well. [name] also processed her own emotions about losing her grandmother. Clinician utilized supportive counseling and normalized feelings. [name] also discussed concerns about her mother, who is actively using drugs. Clinician identified the importance of acceptance and shared serenity prayer teachings. [name] was unhappy with not having control, but also understood that she has no control over her mother or her sister. Clinician validated this discomfort, but also noted the freedom in not having control or responsibility over the lives and choices of others.   Plan: Return again in 2 weeks.  Diagnosis: Axis I: Moderate mixed bipolar I disorder  [name], LCSW 10/16/2017

## 2017-10-23 ENCOUNTER — Encounter (HOSPITAL_COMMUNITY): Payer: Self-pay | Admitting: Psychiatry

## 2017-10-23 ENCOUNTER — Ambulatory Visit (INDEPENDENT_AMBULATORY_CARE_PROVIDER_SITE_OTHER): Payer: Medicare Other | Admitting: Psychiatry

## 2017-10-23 VITALS — BP 126/74 | HR 80 | Ht 69.0 in | Wt 239.0 lb

## 2017-10-23 DIAGNOSIS — F3132 Bipolar disorder, current episode depressed, moderate: Secondary | ICD-10-CM | POA: Diagnosis not present

## 2017-10-23 DIAGNOSIS — Z813 Family history of other psychoactive substance abuse and dependence: Secondary | ICD-10-CM

## 2017-10-23 DIAGNOSIS — F4310 Post-traumatic stress disorder, unspecified: Secondary | ICD-10-CM

## 2017-10-23 DIAGNOSIS — Z818 Family history of other mental and behavioral disorders: Secondary | ICD-10-CM

## 2017-10-23 DIAGNOSIS — Z79899 Other long term (current) drug therapy: Secondary | ICD-10-CM | POA: Diagnosis not present

## 2017-10-23 DIAGNOSIS — Z811 Family history of alcohol abuse and dependence: Secondary | ICD-10-CM

## 2017-10-23 DIAGNOSIS — F5104 Psychophysiologic insomnia: Secondary | ICD-10-CM | POA: Diagnosis not present

## 2017-10-23 DIAGNOSIS — F419 Anxiety disorder, unspecified: Secondary | ICD-10-CM

## 2017-10-23 MED ORDER — DIVALPROEX SODIUM ER 250 MG PO TB24: 750.0000 mg | ORAL_TABLET | Freq: Every day | ORAL | 0 refills | Status: DC
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
## 2017-10-30 ENCOUNTER — Ambulatory Visit (INDEPENDENT_AMBULATORY_CARE_PROVIDER_SITE_OTHER): Payer: Medicare Other | Admitting: Licensed Clinical Social Worker

## 2017-10-30 ENCOUNTER — Encounter (HOSPITAL_COMMUNITY): Payer: Self-pay | Admitting: Licensed Clinical Social Worker

## 2017-10-30 DIAGNOSIS — F3132 Bipolar disorder, current episode depressed, moderate: Secondary | ICD-10-CM

## 2017-10-30 NOTE — Progress Notes (Signed)
   THERAPIST PROGRESS NOTE  Session Time: 8:15am-9:00am  Participation Level: Active  Behavioral Response: Well GroomedAlertDepressed  Type of Therapy: Individual Therapy  Treatment Goals addressed: improve psychiatric symptoms, controlled behavior, moderate mood, and deliberate speech (improved social and work functioning and decrease impulsive behavior), improve unhelpful thought patterns, elevate mood (increased hopefulness and social interactions), interpersonal relationship skills, discuss and process trauma, learn about diagnosis, healthy coping skills  Interventions: Motivational Interviewing, CBT, Grounding and Mindfulness Techniques   Summary: Jordan [name] is a 37 y.o. female who presents with Moderate mixed bipolar I disorder   Suicidal/Homicidal: No -without intent/plan  Therapist Response:  Jordan [name] met with clinician for an individual session. Jordan [name] discussed her psychiatric symptoms, her current life events and her homework. Jordan [name] shared that her grandmother passed away last week on 04-May-2022 and she is currently making funeral arrangements and writing the obituary. Clinician explored grief and noted several days over the weekend where she stayed on her couch. Clinician validated that decision and processed thoughts and feelings. Jordan [name] identified that she finally made herself go out and she started talking to man. Clinician cautioned Jordan [name] about starting a new relationship in a time of great emotional stress. However, Jordan [name] reports she is taking it slow.  Jordan [name] processed concerns about her mother, sister, and niece after the loss of grandmother. Clinician provided case management and resources through Hospice and the community for assistance.   Plan: Return again in 2 weeks.  Diagnosis: Axis I: Moderate mixed bipolar I disorder   [name], LCSW 10/30/2017

## 2017-11-01 ENCOUNTER — Encounter: Payer: Self-pay | Admitting: Internal Medicine

## 2017-11-06 ENCOUNTER — Encounter: Payer: Self-pay | Admitting: Internal Medicine

## 2017-11-06 ENCOUNTER — Encounter (HOSPITAL_COMMUNITY): Payer: Self-pay | Admitting: Licensed Clinical Social Worker

## 2017-11-06 ENCOUNTER — Ambulatory Visit (INDEPENDENT_AMBULATORY_CARE_PROVIDER_SITE_OTHER): Payer: Medicare Other | Admitting: Licensed Clinical Social Worker

## 2017-11-06 DIAGNOSIS — F3162 Bipolar disorder, current episode mixed, moderate: Secondary | ICD-10-CM | POA: Diagnosis not present
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
## 2017-11-13 ENCOUNTER — Encounter (HOSPITAL_COMMUNITY): Payer: Self-pay | Admitting: Licensed Clinical Social Worker

## 2017-11-13 ENCOUNTER — Ambulatory Visit (INDEPENDENT_AMBULATORY_CARE_PROVIDER_SITE_OTHER): Payer: Medicare Other | Admitting: Licensed Clinical Social Worker

## 2017-11-13 DIAGNOSIS — F3162 Bipolar disorder, current episode mixed, moderate: Secondary | ICD-10-CM
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
## 2017-11-15 DIAGNOSIS — M545 Low back pain: Secondary | ICD-10-CM | POA: Diagnosis not present

## 2017-11-19 ENCOUNTER — Ambulatory Visit: Payer: Medicare Other | Attending: Orthopedic Surgery | Admitting: Physical Therapy

## 2017-11-19 DIAGNOSIS — R262 Difficulty in walking, not elsewhere classified: Secondary | ICD-10-CM | POA: Insufficient documentation

## 2017-11-19 DIAGNOSIS — M62838 Other muscle spasm: Secondary | ICD-10-CM | POA: Insufficient documentation

## 2017-11-19 DIAGNOSIS — M545 Low back pain: Secondary | ICD-10-CM | POA: Diagnosis not present

## 2017-11-19 DIAGNOSIS — M5441 Lumbago with sciatica, right side: Secondary | ICD-10-CM | POA: Insufficient documentation

## 2017-11-19 DIAGNOSIS — M62830 Muscle spasm of back: Secondary | ICD-10-CM | POA: Diagnosis not present

## 2017-11-19 DIAGNOSIS — M25651 Stiffness of right hip, not elsewhere classified: Secondary | ICD-10-CM | POA: Diagnosis not present

## 2017-11-19 DIAGNOSIS — M6281 Muscle weakness (generalized): Secondary | ICD-10-CM | POA: Diagnosis not present

## 2017-11-20 ENCOUNTER — Encounter: Payer: Self-pay | Admitting: Physical Therapy

## 2017-11-20 ENCOUNTER — Ambulatory Visit (INDEPENDENT_AMBULATORY_CARE_PROVIDER_SITE_OTHER): Payer: Medicare Other | Admitting: Licensed Clinical Social Worker

## 2017-11-20 ENCOUNTER — Encounter (HOSPITAL_COMMUNITY): Payer: Self-pay | Admitting: Licensed Clinical Social Worker

## 2017-11-20 DIAGNOSIS — F3162 Bipolar disorder, current episode mixed, moderate: Secondary | ICD-10-CM | POA: Diagnosis not present

## 2017-11-20 NOTE — Therapy (Signed)
[place], [place], [address] Phone: [phone]   Fax:  [phone]  Physical Therapy Evaluation  Patient Details  Name: Jordan [name] MRN: [id number] Date of Birth: 1980-06-14 Referring Provider (PT): Candace Gallus    Encounter Date: 11/19/2017  PT End of Session - 11/20/17 1344    Visit Number  1    Number of Visits  16    Date for PT Re-Evaluation  01/15/18    Authorization Type  [employer] MCR     Activity Tolerance  Patient tolerated treatment well    Behavior During Therapy  [facility] for tasks assessed/performed       Past Medical History:  Diagnosis Date  . Allergic rhinitis    pt takes flonase for episodes, no episodes in 2012  . Anxiety   . Basal cell carcinoma    recurrent in left maxillary, has had 5 surguries, last one 2003  . Basal cell nevus syndrome    dx age 37  . Bipolar 1 disorder ([place])   . Candidiasis, vagina    recurrent, pt has made lifestyle modifications and none recently (as of 8/12)  . Cyst of nasal sinus   . Fibroma    left ovarian  . Fibromyalgia   . Gardnerella infection    + in 08/08  . GERD (gastroesophageal reflux disease)    occ  . Herniated disc   . OVARIAN CYSTECTOMY, HX OF 03/01/2006   ovarian fibroma-left 1996, L ovary and fallopian tube removed    . Paronychia of third finger of left hand 07/28/2010  . PLANTAR FASCIITIS, BILATERAL 05/24/2009  . PTSD (post-traumatic stress disorder)   . [place]    hx of herpetic requiring I+D,( was bitten by autistic child that cares fr    Past Surgical History:  Procedure Laterality Date  . CHOLECYSTECTOMY    . LEFT OOPHORECTOMY    . LUMBAR LAMINECTOMY/DECOMPRESSION MICRODISCECTOMY N/A 08/14/2012   Procedure: LUMBAR LAMINECTOMY/DECOMPRESSION MICRODISCECTOMY Lumbar 5 -sacrum 1 decompression;  Surgeon: Sinclair Ship, MD;  Location: [place];  Service: Orthopedics;  Laterality: N/A;  Lumbar 5 -sacrum 1 decompression  . MANDIBLE  RECONSTRUCTION  92   upper anfd lower jaw cyst  . NASAL SINUS SURGERY     X 2 at age 84 & 29    There were no vitals filed for this visit.   Subjective Assessment - 11/19/17 1550    Subjective  Patient was helping roll her grandmother when she felt like she "pulled a muscle" in the middle of her low back. Has been able to relieve pain with medications or stretches and has continuned to get worse.    Limitations  Sitting;Lifting;Other (comment)   changing positions or prolonged positioning; inclined/declines/stairs   How long can you sit comfortably?  less than a minute    How long can you stand comfortably?  3o minutes     How long can you walk comfortably?  15 minutes     Diagnostic tests  X-ray on lumbar regoin; MD says no change     Patient Stated Goals  Sit longer with pain relief; change positoins without pain; get back to kickball and softball     Currently in Pain?  Yes    Pain Score  6     Pain Location  Back    Pain Orientation  Mid    Pain Descriptors / Indicators  Nagging;Constant    Pain Type  Acute pain    Pain  Onset  1 to 4 weeks ago    Pain Frequency  Constant    Aggravating Factors   sitting or standing for prolonged periods; changing positions    Pain Relieving Factors  Ice for a little whilte     Effect of Pain on Daily Activities  Staying on first level to avoid increased pain going up stairs to bedroom    Multiple Pain Sites  No         [facility] PT Assessment - 11/20/17 0001      Assessment   Medical Diagnosis  Low pack pain    Referring Provider (PT)  Candace Gallus     Onset Date/Surgical Date  10/08/17    Hand Dominance  Right    Next MD Visit  --   TBD dependent on PT   Prior Therapy  1 year ago      Precautions   Precautions  None      Restrictions   Weight Bearing Restrictions  No      Balance Screen   Has the patient fallen in the past 6 months  No    Has the patient had a decrease in activity level because of a fear of falling?   No    Is  the patient reluctant to leave their home because of a fear of falling?   No      Home Environment   Living Environment  Private residence    Living Arrangements  Alone    Type of [place] to enter    Entrance Stairs-Number of Steps  5    Entrance Stairs-Rails  Can reach both    Home Layout  Two level    Alternate Level Stairs-Number of Steps  13    Alternate Level Stairs-Rails  Can reach both    [place] - 2 wheels;Cane - single point      Prior Function   Level of Independence  Independent    Vocation  Full time employment    Leisure  kickball and softball      Cognition   Overall Cognitive Status  Within Functional Limits for tasks assessed    Attention  Focused    Focused Attention  Appears intact    Memory  Appears intact    Awareness  Appears intact    Problem Solving  Appears intact      Observation/Other Assessments   Focus on Therapeutic Outcomes (FOTO)   52% limiation       Sensation   Light Touch  Appears Intact    Additional Comments  pain radiating down posterir right leg and into her right foot       Coordination   Gross Motor Movements are Fluid and Coordinated  Yes    Fine Motor Movements are Fluid and Coordinated  Yes      AROM   Lumbar Flexion  50%   had to walk self up legs in order to come to standing   Lumbar Extension  0%    Lumbar - Right Side Bend  significnat pain     Lumbar - Left Side Bend  less pain     Lumbar - Right Rotation  limited 50%     Lumbar - Left Rotation  limited 50%       PROM   Right Hip Internal Rotation   --   pain   Left Hip Flexion  --   pain  worse than right   Left Hip Internal Rotation   --   pain worse than right      Strength   Right Hip Flexion  4-/5    Right Hip ABduction  4/5    Right Hip ADduction  3+/5    Left Hip Flexion  4-/5    Left Hip ABduction  4/5    Left Hip ADduction  3+/5    Right Knee Flexion  3+/5    Right Knee Extension  4/5    Left Knee Flexion   3+/5    Left Knee Extension  4/5      Palpation   Palpation comment  significant spasming and tenderness to palpation on the right side. Low tolerance to light palpation       Special Tests   Other special tests  sacral compression in standing (+)       Ambulation/Gait   Gait Comments  ambaultes with flexed trunk; decreased hip rotation                 Objective measurements completed on examination: See above findings.      [facility] Adult PT Treatment/Exercise - 11/20/17 0001      Exercises   Exercises  Lumbar      Lumbar Exercises: Stretches   Lower Trunk Rotation Limitations  x10       Lumbar Exercises: Standing   Other Standing Lumbar Exercises  tennis ball trigger point release              PT Education - 11/20/17 1336    Education Details  symptom mangement; HEP    Person(s) Educated  Patient    Methods  Explanation;Demonstration;Tactile cues;Verbal cues    Comprehension  Verbalized understanding;Returned demonstration;Verbal cues required;Tactile cues required       PT Short Term Goals - 11/20/17 1359      PT SHORT TERM GOAL #1   Title  Patient will increase lumbar flexion by 25 %     Time  4    Period  Weeks    Status  New    Target Date  12/18/17      PT SHORT TERM GOAL #2   Title  Patient will report decreased radicualr symptoms into right leg with centralized pain < 3/10     Time  4    Period  Weeks    Status  New    Target Date  12/18/17      PT SHORT TERM GOAL #3   Title  Patient will increase gross bilateral lower extremity strength to 4+/5     Time  4    Period  Weeks    Status  New    Target Date  12/18/17        PT Long Term Goals - 11/20/17 1400      PT LONG TERM GOAL #1   Title  Patient will sit for 1 hour without increased pain in order to perfrom work tasks     Time  8    Period  Weeks    Status  New    Target Date  01/15/18      PT LONG TERM GOAL #2   Title  Patient will stand for 1 hour without pain in order  to perform ADL's     Time  8    Period  Weeks    Status  New    Target Date  01/15/18      PT  LONG TERM GOAL #3   Title  Patient will be independent with complete exercise program that allows her to go back to softball and kickball    Time  8    Period  Weeks    Status  New    Target Date  01/15/18             Plan - 11/20/17 1346    Clinical Impression Statement  Patient is a 37 year old female with significant right sided low back pain and radicuaopthy into her right leg. She has increased pain sitting. Signs and symptoms are consitent with lumbar sprain and potential lumbar disc dysfucntion. It was difficult to assess disc dysfunction 2nd to difficulty with prone positioning and bending. She has spasming of bilateral paraspinals and into boltaeral gluteals. She hasd increased pain with scacral compresion in standing. She has decreased strength of bilateral lower extremitys. She would benefit from skilled therapy to improve functional mobility, and decrease pain in sitting and at night.     History and Personal Factors relevant to plan of care:  lumbar microdysectomy, PTSD     Clinical Presentation  Evolving    Clinical Presentation due to:  pain that is porgressivly effecting her mobility     Clinical Decision Making  Moderate    PT Frequency  2x / week    PT Duration  8 weeks    PT Treatment/Interventions  ADLs/Self Care Home Management;Cryotherapy;Electrical Stimulation;Iontophoresis 4mg /ml Dexamethasone;Moist Heat;Ultrasound;Gait training;Stair training;Therapeutic activities;Therapeutic exercise;Patient/family education;Neuromuscular re-education;Manual techniques;Dry needling;Passive range of motion;Taping    PT Next Visit Plan  may haave to begin with light manual therapy in side lying or sitting; Most of the pain is focused on the right side. begin light core strengthening when able; consider modalities; needling when tolerated     PT Home Exercise Plan  suggested LTR;  patient reported she is doing prayer stretch; reviewed tennis ball trigger point release     Consulted and Agree with Plan of Care  Patient       Patient will benefit from skilled therapeutic intervention in order to improve the following deficits and impairments:  Pain, Postural dysfunction, Decreased activity tolerance, Decreased range of motion, Decreased strength, Increased muscle spasms, Abnormal gait, Difficulty walking  Visit Diagnosis: Acute right-sided low back pain with right-sided sciatica  Muscle spasm of back     Problem List Patient Active Problem List   Diagnosis Date Noted  . Vaginal discharge 10/03/2017  . Plantar fasciitis 07/18/2017  . Fall 07/12/2017  . Generalized abdominal pain 03/23/2017  . Right knee pain 11/30/2016  . BPPV (benign paroxysmal positional vertigo) 03/01/2016  . Post traumatic stress disorder (PTSD) 11/09/2015  . Hereditary and idiopathic peripheral neuropathy 09/29/2015  . Bipolar disorder ([place]) 07/04/2015  . Idiopathic hypotension 02/03/2015  . At risk for abnormal blood glucose level 12/15/2013  . Preventative health care 12/19/2011  . Fibromyalgia 06/26/2010  . Insomnia 06/26/2010  . Chronic fatigue and depression 06/26/2010  . Obesity 01/17/2010    Class: Chronic  . Lumbar disc herniation of L5-S1 on the left side (MRI 2012 s/p surgery)  02/26/2008  . Allergic rhinitis 07/24/2006  . Gastroesophageal reflux disease 03/04/2006  . 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    Carney Living PT DPT  11/20/2017, 2:11 PM Einar Crow SPT  11/20/2017   During this treatment session, the therapist was present, participating in and directing the treatment. [place], [place], [address] Phone:  [phone]   Fax:  [phone]  Name: Jordan [name] MRN: [id number] Date of Birth: 03-12-1980

## 2017-11-20 NOTE — Progress Notes (Signed)
   THERAPIST PROGRESS NOTE  Session Time: 8:00am-9:00am  Participation Level: Active  Behavioral Response: Well GroomedAlertEuthymic  Type of Therapy: Individual Therapy  Treatment Goals addressed: improve psychiatric symptoms, controlled behavior, moderate mood, and deliberate speech (improved social and work functioning and decrease impulsive behavior), improve unhelpful thought patterns, elevate mood (increased hopefulness and social interactions), interpersonal relationship skills, discuss and process trauma, learn about diagnosis, healthy coping skills  Interventions: Motivational Interviewing, CBT, Grounding and Mindfulness Techniques   Summary: Jordan [name] is a 37 y.o. female who presents with Moderate mixed bipolar I disorder   Suicidal/Homicidal: No -without intent/plan  Therapist Response:  Jordan [name] met with clinician for an individual session. Jordan [name] discussed her psychiatric symptoms, her current life events and her homework. Jordan [name] shared that she has been trying to distance herself from her mother and sister, spending more time with aunts who are depressed over the loss of grandmother. Clinician explored Aziza's progress through her grief. Jordan [name] identified that she has been progressing well and feels moderately sad, but not depressed. She also reports she has been busy and enjoying the freedom of her new car and her new relationship. Jordan [name] discussed concerns about her sister and mother. She also noted that she has not been getting involved in their lives due to the fact that they are adults and they need to be responsible for themselves. Clinician and Jordan [name] processed past of taking over care of her niece when she was a late teenager. She processed her thoughts and feelings about how sister presents at church.   Plan: Return again in 2 weeks.  Diagnosis: Axis I: Moderate mixed bipolar I disorder   [name], LCSW 11/20/2017

## 2017-11-20 NOTE — Patient Instructions (Signed)
Patient advised to perform the tennis ball at home LTR and she has been doing the prayer stretch

## 2017-11-21 ENCOUNTER — Other Ambulatory Visit: Payer: Self-pay

## 2017-11-21 ENCOUNTER — Ambulatory Visit: Payer: Medicare Other | Admitting: Physical Therapy

## 2017-11-21 DIAGNOSIS — M62830 Muscle spasm of back: Secondary | ICD-10-CM | POA: Diagnosis not present

## 2017-11-21 DIAGNOSIS — M545 Low back pain: Secondary | ICD-10-CM | POA: Diagnosis not present

## 2017-11-21 DIAGNOSIS — M6281 Muscle weakness (generalized): Secondary | ICD-10-CM | POA: Diagnosis not present

## 2017-11-21 DIAGNOSIS — M62838 Other muscle spasm: Secondary | ICD-10-CM | POA: Diagnosis not present

## 2017-11-21 DIAGNOSIS — M5441 Lumbago with sciatica, right side: Secondary | ICD-10-CM | POA: Diagnosis not present

## 2017-11-21 DIAGNOSIS — M25651 Stiffness of right hip, not elsewhere classified: Secondary | ICD-10-CM | POA: Diagnosis not present

## 2017-11-21 DIAGNOSIS — R262 Difficulty in walking, not elsewhere classified: Secondary | ICD-10-CM | POA: Diagnosis not present

## 2017-11-21 NOTE — Therapy (Signed)
[facility] [address] [place], [place], [address] Phone: [phone]   Fax:  [phone]  Physical Therapy Treatment  Patient Details  Name: Jordan [name] MRN: [id number] Date of Birth: 1980/08/10 Referring Provider (PT): Charyl Dancer    Encounter Date: 11/21/2017  PT End of Session - 11/21/17 1246    Visit Number  2    Number of Visits  16    Date for PT Re-Evaluation  01/15/18    Authorization Type  [employer] MCR     PT Start Time  1138    PT Stop Time  1222    PT Time Calculation (min)  44 min    Activity Tolerance  --   Tolerance for STM limited due to pain on left side, brief pain with hip adduction isometric otherwise exercises tolerated well   Behavior During Therapy  [facility] for tasks assessed/performed       Past Medical History:  Diagnosis Date  . Allergic rhinitis    pt takes flonase for episodes, no episodes in 2012  . Anxiety   . Basal cell carcinoma    recurrent in left maxillary, has had 5 surguries, last one 2003  . Basal cell nevus syndrome    dx age 69  . Bipolar 1 disorder ([facility])   . Candidiasis, vagina    recurrent, pt has made lifestyle modifications and none recently (as of 8/12)  . Cyst of nasal sinus   . Fibroma    left ovarian  . Fibromyalgia   . Gardnerella infection    + in 08/08  . GERD (gastroesophageal reflux disease)    occ  . Herniated disc   . OVARIAN CYSTECTOMY, HX OF 03/01/2006   ovarian fibroma-left 1996, L ovary and fallopian tube removed    . Paronychia of third finger of left hand 07/28/2010  . PLANTAR FASCIITIS, BILATERAL 05/24/2009  . PTSD (post-traumatic stress disorder)   . [place]    hx of herpetic requiring I+D,( was bitten by autistic child that cares fr    Past Surgical History:  Procedure Laterality Date  . CHOLECYSTECTOMY    . LEFT OOPHORECTOMY    . LUMBAR LAMINECTOMY/DECOMPRESSION MICRODISCECTOMY N/A 08/14/2012   Procedure: LUMBAR LAMINECTOMY/DECOMPRESSION  MICRODISCECTOMY Lumbar 5 -sacrum 1 decompression;  Surgeon: Emilee Hero, MD;  Location: [facility];  Service: Orthopedics;  Laterality: N/A;  Lumbar 5 -sacrum 1 decompression  . MANDIBLE RECONSTRUCTION  92   upper anfd lower jaw cyst  . NASAL SINUS SURGERY     X 2 at age 50 & 19    There were no vitals filed for this visit.  Subjective Assessment - 11/21/17 1136    Subjective  Pt. reports right leg symptoms have improved. Current symptoms localized to left>right lumbar/paraspinal region. Still unable to tolerate prone positioning. Soreness noted after STM at eval.    Currently in Pain?  Yes    Pain Score  4     Pain Location  Back    Pain Descriptors / Indicators  Dull    Pain Type  Acute pain    Pain Radiating Towards  no radiating symptoms today                       [facility] Adult PT Treatment/Exercise - 11/21/17 0001      Exercises   Exercises  Lumbar      Lumbar Exercises: Stretches   Passive Hamstring Stretch  3 reps;Left;Right;30 seconds  Single Knee to Chest Stretch  3 reps;30 seconds;Right;Left    Double Knee to Chest Stretch  --   2x10 manually assisted with left rotation   Lower Trunk Rotation Limitations  x20    Pelvic Tilt  20 reps    Piriformis Stretch  Right;Left;3 reps;30 seconds    Other Lumbar Stretch Exercise  --   manual L QL stretch in R sidelying 3x30 sec     Lumbar Exercises: Supine   Pelvic Tilt  20 reps    Other Supine Lumbar Exercises  --   hip add. isometric in hooklying x7 reps stopped due to pain     Manual Therapy   Manual Therapy  Soft tissue mobilization;Joint mobilization    Manual therapy comments  STM and myofascial release to bilateral lumbar paraspinals in sidelying (both sides to target respective sides of paraspinals), also perform long axis hip distraction in supine grade I-III bilat.               PT Short Term Goals - 11/20/17 1359      PT SHORT TERM GOAL #1   Title  Patient will increase lumbar  flexion by 25 %     Time  4    Period  Weeks    Status  New    Target Date  12/18/17      PT SHORT TERM GOAL #2   Title  Patient will report decreased radicualr symptoms into right leg with centralized pain < 3/10     Time  4    Period  Weeks    Status  New    Target Date  12/18/17      PT SHORT TERM GOAL #3   Title  Patient will increase gross bilateral lower extremity strength to 4+/5     Time  4    Period  Weeks    Status  New    Target Date  12/18/17        PT Long Term Goals - 11/20/17 1400      PT LONG TERM GOAL #1   Title  Patient will sit for 1 hour without increased pain in order to perfrom work tasks     Time  8    Period  Weeks    Status  New    Target Date  01/15/18      PT LONG TERM GOAL #2   Title  Patient will stand for 1 hour without pain in order to perform ADL's     Time  8    Period  Weeks    Status  New    Target Date  01/15/18      PT LONG TERM GOAL #3   Title  Patient will be independent with complete exercise program that allows her to go back to softball and kickball    Time  8    Period  Weeks    Status  New    Target Date  01/15/18            Plan - 11/21/17 1255    Clinical Impression Statement  Initial symptoms of right LE radiating pain with decreased sitting tolerance suggestive disc etiology but pt. presented with flexion bias ROM which suspect could be facet-mediated contribution. Continues with diffuse lumbar muscle spasm and tightness left>right side.     PT Frequency  2x / week    PT Duration  8 weeks    PT Treatment/Interventions  ADLs/Self Care Home Management;Cryotherapy;Electrical Stimulation;Iontophoresis  4mg /ml Dexamethasone;Moist Heat;Ultrasound;Gait training;Stair training;Therapeutic activities;Therapeutic exercise;Patient/family education;Neuromuscular re-education;Manual techniques;Dry needling;Passive range of motion;Taping    PT Next Visit Plan  Continue manual therapy in sidelying, progress exercises as  tolerated tentatively with flexion bias movements pending tolerance    PT Home Exercise Plan  Continue LTR and tennis ball release    Consulted and Agree with Plan of Care  Patient       Patient will benefit from skilled therapeutic intervention in order to improve the following deficits and impairments:  Pain, Postural dysfunction, Decreased activity tolerance, Decreased range of motion, Decreased strength, Increased muscle spasms, Abnormal gait, Difficulty walking  Visit Diagnosis: Acute right-sided low back pain with right-sided sciatica  Muscle spasm of back     Problem List Patient Active Problem List   Diagnosis Date Noted  . Vaginal discharge 10/03/2017  . Plantar fasciitis 07/18/2017  . Fall 07/12/2017  . Generalized abdominal pain 03/23/2017  . Right knee pain 11/30/2016  . BPPV (benign paroxysmal positional vertigo) 03/01/2016  . Post traumatic stress disorder (PTSD) 11/09/2015  . Hereditary and idiopathic peripheral neuropathy 09/29/2015  . Bipolar disorder (H[facility]) 07/04/2015  . Idiopathic hypotension 02/03/2015  . At risk for abnormal blood glucose level 12/15/2013  . Preventative health care 12/19/2011  . Fibromyalgia 06/26/2010  . Insomnia 06/26/2010  . Chronic fatigue and depression 06/26/2010  . Obesity 01/17/2010    Class: Chronic  . Lumbar disc herniation of L5-S1 on the left side (MRI 2012 s/p surgery)  02/26/2008  . Allergic rhinitis 07/24/2006  . Gastroesophageal reflux disease 03/04/2006  . 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    Di Kindle Arlesia Kiel, PT, DPT 11/21/2017, 1:00 PM  [facility] [address] [place], [place], [address] Phone: [phone]   Fax:  [phone]  Name: Jordan [name] MRN: [id number] Date of Birth: Mar 13, 1980

## 2017-11-27 ENCOUNTER — Ambulatory Visit (HOSPITAL_COMMUNITY): Payer: Self-pay | Admitting: Licensed Clinical Social Worker

## 2017-11-27 ENCOUNTER — Encounter

## 2017-11-28 ENCOUNTER — Ambulatory Visit: Payer: Medicare Other | Admitting: Physical Therapy

## 2017-11-28 ENCOUNTER — Encounter: Payer: Self-pay | Admitting: Physical Therapy

## 2017-11-28 DIAGNOSIS — M5441 Lumbago with sciatica, right side: Secondary | ICD-10-CM | POA: Diagnosis not present

## 2017-11-28 DIAGNOSIS — M62830 Muscle spasm of back: Secondary | ICD-10-CM | POA: Diagnosis not present

## 2017-11-28 DIAGNOSIS — M25651 Stiffness of right hip, not elsewhere classified: Secondary | ICD-10-CM | POA: Diagnosis not present

## 2017-11-28 DIAGNOSIS — M62838 Other muscle spasm: Secondary | ICD-10-CM | POA: Diagnosis not present

## 2017-11-28 DIAGNOSIS — M545 Low back pain: Secondary | ICD-10-CM | POA: Diagnosis not present

## 2017-11-28 DIAGNOSIS — R262 Difficulty in walking, not elsewhere classified: Secondary | ICD-10-CM | POA: Diagnosis not present

## 2017-11-28 DIAGNOSIS — M6281 Muscle weakness (generalized): Secondary | ICD-10-CM | POA: Diagnosis not present
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
## 2017-11-29 ENCOUNTER — Other Ambulatory Visit: Payer: Self-pay | Admitting: *Deleted

## 2017-11-29 DIAGNOSIS — M797 Fibromyalgia: Secondary | ICD-10-CM

## 2017-11-29 NOTE — Telephone Encounter (Signed)
Next appt scheduled 11/1 with PCP.

## 2017-12-02 MED ORDER — PREGABALIN 225 MG PO CAPS: 225.0000 mg | ORAL_CAPSULE | Freq: Two times a day (BID) | ORAL | 2 refills | Status: DC

## 2017-12-03 ENCOUNTER — Ambulatory Visit: Payer: Medicare Other | Admitting: Physical Therapy

## 2017-12-03 ENCOUNTER — Encounter: Payer: Self-pay | Admitting: Physical Therapy

## 2017-12-03 DIAGNOSIS — R262 Difficulty in walking, not elsewhere classified: Secondary | ICD-10-CM

## 2017-12-03 DIAGNOSIS — M5441 Lumbago with sciatica, right side: Secondary | ICD-10-CM | POA: Diagnosis not present

## 2017-12-03 DIAGNOSIS — M62830 Muscle spasm of back: Secondary | ICD-10-CM | POA: Diagnosis not present

## 2017-12-03 DIAGNOSIS — M62838 Other muscle spasm: Secondary | ICD-10-CM | POA: Diagnosis not present

## 2017-12-03 DIAGNOSIS — M25651 Stiffness of right hip, not elsewhere classified: Secondary | ICD-10-CM | POA: Diagnosis not present

## 2017-12-03 DIAGNOSIS — M545 Low back pain, unspecified: Secondary | ICD-10-CM

## 2017-12-03 DIAGNOSIS — M6281 Muscle weakness (generalized): Secondary | ICD-10-CM
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
## 2017-12-03 NOTE — Patient Instructions (Signed)
Hip Flexor Stretch at [facility] of Bed reps: 3 sets: 2 hold: 20 sec daily: 1  weekly: 7      Exercise image step 1   Exercise image step 2 Setup  Begin lying diagonally at the edge of table or bed, with the leg furthest from the edge bent. Movement  Leg your straight leg hang off the edge of the bed, then pull your bent leg toward your chest and hold. You should feel a stretch in front of the hip of your hanging leg. Tip  Make sure to keep your upper body relaxed and do not to let your low back arch during the stretch. Sidelying Thoracic Rotation with Open Book reps: 10 sets: 1 hold: 5sec  daily: 1  weekly: 7      Exercise image step 1   Exercise image step 2   Exercise image step 3 Setup  Begin lying on your side with your legs bent at a 75 degree angle and your arms together straight in front of you on the ground. Movement  Slide your top hand back and forth over your bottom hand 5 times, rotating your shoulders. Then, lift your top arm straight up and over to the floor on your other side.  Tip  Make sure to keep your knees together and only rotate your back and upper arm. Your hips should stay facing forward.

## 2017-12-04 ENCOUNTER — Encounter (HOSPITAL_COMMUNITY): Payer: Self-pay | Admitting: Licensed Clinical Social Worker

## 2017-12-04 ENCOUNTER — Ambulatory Visit (INDEPENDENT_AMBULATORY_CARE_PROVIDER_SITE_OTHER): Payer: Medicare Other | Admitting: Licensed Clinical Social Worker

## 2017-12-04 DIAGNOSIS — R1903 Right lower quadrant abdominal swelling, mass and lump: Secondary | ICD-10-CM | POA: Diagnosis not present

## 2017-12-04 DIAGNOSIS — F3162 Bipolar disorder, current episode mixed, moderate: Secondary | ICD-10-CM | POA: Diagnosis not present
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
## 2017-12-05 ENCOUNTER — Ambulatory Visit (INDEPENDENT_AMBULATORY_CARE_PROVIDER_SITE_OTHER): Payer: Medicare Other | Admitting: Internal Medicine

## 2017-12-05 ENCOUNTER — Other Ambulatory Visit: Payer: Self-pay

## 2017-12-05 ENCOUNTER — Encounter: Payer: Self-pay | Admitting: Physical Therapy

## 2017-12-05 ENCOUNTER — Ambulatory Visit: Payer: Medicare Other | Admitting: Physical Therapy

## 2017-12-05 VITALS — BP 101/42 | HR 59 | Temp 98.6°F | Ht 69.0 in | Wt 239.2 lb

## 2017-12-05 DIAGNOSIS — M797 Fibromyalgia: Secondary | ICD-10-CM | POA: Diagnosis not present

## 2017-12-05 DIAGNOSIS — Z793 Long term (current) use of hormonal contraceptives: Secondary | ICD-10-CM

## 2017-12-05 DIAGNOSIS — R110 Nausea: Secondary | ICD-10-CM

## 2017-12-05 DIAGNOSIS — M62830 Muscle spasm of back: Secondary | ICD-10-CM

## 2017-12-05 DIAGNOSIS — Z79899 Other long term (current) drug therapy: Secondary | ICD-10-CM

## 2017-12-05 DIAGNOSIS — R262 Difficulty in walking, not elsewhere classified: Secondary | ICD-10-CM | POA: Diagnosis not present

## 2017-12-05 DIAGNOSIS — M545 Low back pain, unspecified: Secondary | ICD-10-CM

## 2017-12-05 DIAGNOSIS — M5441 Lumbago with sciatica, right side: Secondary | ICD-10-CM | POA: Diagnosis not present

## 2017-12-05 DIAGNOSIS — M6281 Muscle weakness (generalized): Secondary | ICD-10-CM

## 2017-12-05 DIAGNOSIS — M62838 Other muscle spasm: Secondary | ICD-10-CM | POA: Diagnosis not present

## 2017-12-05 DIAGNOSIS — M25651 Stiffness of right hip, not elsewhere classified: Secondary | ICD-10-CM | POA: Diagnosis not present

## 2017-12-05 HISTORY — DX: Nausea: R11.0

## 2017-12-05 NOTE — Assessment & Plan Note (Signed)
Patient is here for fibromyalgia follow-up.  She is normally well controlled on Lyrica to 25 mg twice daily.  Unfortunately patient ran out of her prescription last Friday, and her pharmacy did not have the medication in stock.  She went without the medicine Friday through Sunday and reports a severe fibromyalgia flare.  She was unable to walk and laid on the couch most of the weekend.  She reported pain all over in all of her joints and burning in her feet. She was able to pick up her refill on Monday and is feeling much better today. -- Continue Lyrica 225 mg BID -- Instructed patient to call if she ever has a problem filling prescription in future and we can send to another pharmacy

## 2017-12-05 NOTE — Assessment & Plan Note (Signed)
Patient is complaining of nausea and decreased appetite for the past few days. She has taken very little PO. Denies emesis and abdominal pain. She was seen by her gynecologist 6 days ago and started on a new birth control medication. Per [facility], GI upset (N&V) are common side effects in up to 16% of patients on sprintec. Instructed patient to call her gynecologist to see if she could be changed to a different oral contraceptive.

## 2017-12-05 NOTE — Therapy (Signed)
[facility] [place], [place], [address] Phone: [phone]   Fax:  [phone]  Physical Therapy Treatment  Patient Details  Name: Jordan [name] MRN: [id number] Date of Birth: 1980-07-21 Referring Provider (PT): Candace Gallus    Encounter Date: 12/05/2017  PT End of Session - 12/05/17 1121    Visit Number  5    Number of Visits  16    Date for PT Re-Evaluation  01/15/18    Authorization Type  [employer] MCR     PT Start Time  1102    PT Stop Time  5449    PT Time Calculation (min)  43 min       Past Medical History:  Diagnosis Date  . Allergic rhinitis    pt takes flonase for episodes, no episodes in 2012  . Anxiety   . Basal cell carcinoma    recurrent in left maxillary, has had 5 surguries, last one 2003  . Basal cell nevus syndrome    dx age 85  . Bipolar 1 disorder ([place])   . Candidiasis, vagina    recurrent, pt has made lifestyle modifications and none recently (as of 8/12)  . Cyst of nasal sinus   . Fibroma    left ovarian  . Fibromyalgia   . Gardnerella infection    + in 08/08  . GERD (gastroesophageal reflux disease)    occ  . Herniated disc   . OVARIAN CYSTECTOMY, HX OF 03/01/2006   ovarian fibroma-left 1996, L ovary and fallopian tube removed    . Paronychia of third finger of left hand 07/28/2010  . PLANTAR FASCIITIS, BILATERAL 05/24/2009  . PTSD (post-traumatic stress disorder)   . Whitlow    hx of herpetic requiring I+D,( was bitten by autistic child that cares fr    Past Surgical History:  Procedure Laterality Date  . CHOLECYSTECTOMY    . LEFT OOPHORECTOMY    . LUMBAR LAMINECTOMY/DECOMPRESSION MICRODISCECTOMY N/A 08/14/2012   Procedure: LUMBAR LAMINECTOMY/DECOMPRESSION MICRODISCECTOMY Lumbar 5 -sacrum 1 decompression;  Surgeon: Sinclair Ship, MD;  Location: [facility];  Service: Orthopedics;  Laterality: N/A;  Lumbar 5 -sacrum 1 decompression  . MANDIBLE RECONSTRUCTION  89   upper anfd  lower jaw cyst  . NASAL SINUS SURGERY     X 2 at age 67 & 3    There were no vitals filed for this visit.  Subjective Assessment - 12/05/17 1103    Subjective  I have lost 11 lbs in 4 days. Going to doctor today. Back is not hurting but does hurt when sitting in church.     Currently in Pain?  No/denies    Pain Location  Back                       [facility] Adult PT Treatment/Exercise - 12/05/17 0001      Exercises   Exercises  Lumbar      Lumbar Exercises: Stretches   Passive Hamstring Stretch  3 reps;Left;Right;30 seconds    Single Knee to Chest Stretch  3 reps;30 seconds;Right;Left    Lower Trunk Rotation Limitations  x20    Hip Flexor Stretch  2 reps;60 seconds;Right;Left    Hip Flexor Stretch Limitations  massage roller to each thigh    Other Lumbar Stretch Exercise  Open books bil 1x8 3 sec holds; thomas test stretch on left hip flexors 1 20 sec hold       Lumbar Exercises: Seated  Other Seated Lumbar Exercises  instructed in seated exercises to perform when she needs to sit prolonged (ie church) including pelvic tilits, piriformis stretching       Lumbar Exercises: Supine   Pelvic Tilt  20 reps    Other Supine Lumbar Exercises  bilateral clams with Tra contract green band       Manual Therapy   Manual therapy comments  massage roller to bilat hip flexors               PT Short Term Goals - 12/05/17 1132      PT SHORT TERM GOAL #1   Title  Patient will increase lumbar flexion by 25 %     Baseline  full bilateral     Time  4    Period  Weeks    Status  On-going      PT SHORT TERM GOAL #2   Title  Patient will report decreased radicualr symptoms into right leg with centralized pain < 3/10     Baseline  no longer has radicular pain    Time  4    Period  Weeks    Status  Achieved      PT SHORT TERM GOAL #3   Title  Patient will increase gross bilateral lower extremity strength to 4+/5     Time  4    Period  Weeks    Status  Unable to  assess      PT SHORT TERM GOAL #4   Title  Patient will be independent with inital stretching and strengthening program.     Time  4    Period  Weeks    Status  Achieved        PT Long Term Goals - 11/20/17 1400      PT LONG TERM GOAL #1   Title  Patient will sit for 1 hour without increased pain in order to perfrom work tasks     Time  8    Period  Weeks    Status  New    Target Date  01/15/18      PT LONG TERM GOAL #2   Title  Patient will stand for 1 hour without pain in order to perform ADL's     Time  8    Period  Weeks    Status  New    Target Date  01/15/18      PT LONG TERM GOAL #3   Title  Patient will be independent with complete exercise program that allows her to go back to softball and kickball    Time  8    Period  Weeks    Status  New    Target Date  01/15/18            Plan - 12/05/17 1227    Clinical Impression Statement  Significant hip flexor tightness Left > right, Pt reports she is unable to lie prone. Manual performed to bilateral hip flexors to improved flexibility. She tolerated lumbar rotation stretching well today without spasm. She reprots overall LBP improved however notes difficulty with  prolonged sitting. Reminded her of exercises she could use while sitting to improved pain. She declined modalities or manual today. Felt good after session.     PT Next Visit Plan  thomas test stretch, piriformis stretch, hamstring stretch, bridges with t-band, SLR, dead bug, clamshells in sidelying, manual therapy in sidelying, progress rotational movements such as able such as chops and lifts,  PT Home Exercise Plan  Continue LTR and tennis ball release, open books, thomas test stretch     Consulted and Agree with Plan of Care  Patient       Patient will benefit from skilled therapeutic intervention in order to improve the following deficits and impairments:  Pain, Postural dysfunction, Decreased activity tolerance, Decreased range of motion,  Decreased strength, Increased muscle spasms, Abnormal gait, Difficulty walking  Visit Diagnosis: Acute left-sided low back pain without sciatica  Muscle spasm of back  Weakness of trunk musculature     Problem List Patient Active Problem List   Diagnosis Date Noted  . Vaginal discharge 10/03/2017  . Plantar fasciitis 07/18/2017  . Fall 07/12/2017  . Generalized abdominal pain 03/23/2017  . Right knee pain 11/30/2016  . BPPV (benign paroxysmal positional vertigo) 03/01/2016  . Post traumatic stress disorder (PTSD) 11/09/2015  . Hereditary and idiopathic peripheral neuropathy 09/29/2015  . Bipolar disorder ([place]) 07/04/2015  . Idiopathic hypotension 02/03/2015  . At risk for abnormal blood glucose level 12/15/2013  . Preventative health care 12/19/2011  . Fibromyalgia 06/26/2010  . Insomnia 06/26/2010  . Chronic fatigue and depression 06/26/2010  . Obesity 01/17/2010    Class: Chronic  . Lumbar disc herniation of L5-S1 on the left side (MRI 2012 s/p surgery)  02/26/2008  . Allergic rhinitis 07/24/2006  . Gastroesophageal reflux disease 03/04/2006  . 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    Dorene Ar, PTA 12/05/2017, 12:30 PM  [facility] [place], [place], [address] Phone: [phone]   Fax:  [phone]  Name: SHENICA HOLZHEIMER MRN: [id number] Date of Birth: [birth year]

## 2017-12-05 NOTE — Progress Notes (Signed)
   CC: Fibromyalgia follow up  HPI:  [name] is a 37 y.o. female with past medical history outlined below here for fibromyalgia follow up. For the details of today's visit, please refer to the assessment and plan.  Past Medical History:  Diagnosis Date  . Allergic rhinitis    pt takes flonase for episodes, no episodes in 2012  . Anxiety   . Basal cell carcinoma    recurrent in left maxillary, has had 5 surguries, last one 2003  . Basal cell nevus syndrome    dx age 67  . Bipolar 1 disorder ([place])   . Candidiasis, vagina    recurrent, pt has made lifestyle modifications and none recently (as of 8/12)  . Cyst of nasal sinus   . Fibroma    left ovarian  . Fibromyalgia   . Gardnerella infection    + in 08/08  . GERD (gastroesophageal reflux disease)    occ  . Herniated disc   . OVARIAN CYSTECTOMY, HX OF 03/01/2006   ovarian fibroma-left 1996, L ovary and fallopian tube removed    . Paronychia of third finger of left hand 07/28/2010  . PLANTAR FASCIITIS, BILATERAL 05/24/2009  . PTSD (post-traumatic stress disorder)   . Whitlow    hx of herpetic requiring I+D,( was bitten by autistic child that cares fr    Review of Systems  Gastrointestinal: Positive for nausea. Negative for abdominal pain.    Physical Exam:  Vitals:   12/05/17 1334  BP: (!) 101/42  Pulse: (!) 59  Temp: 98.6 F (37 C)  TempSrc: Oral  SpO2: 100%  Weight: 239 lb 3.2 oz (108.5 kg)  Height: 5\' 9"  (1.753 m)    Constitutional: NAD, appears comfortable  Cardiovascular: RRR, no murmurs, rubs, or gallops.  Pulmonary/Chest: CTAB, no wheezes, rales, or rhonchi.  Extremities: Warm and well perfused. No edema.  Psychiatric: Normal mood and affect  Assessment & Plan:   See Encounters Tab for problem based charting.  Patient discussed with Dr. Rebeca Alert

## 2017-12-05 NOTE — Patient Instructions (Signed)
Jordan [name],  It was a pleasure to meet you. I am sorry to hear about your fibromyalgia flair, but I am glad you are doing better. Please continue to take your lyrica. If you have any trouble filling your prescription in the future, please give our office a call.   Please talk to your gynecologist about changing your birth control since you are experiencing side effects.   Please follow up with your primary doctor as scheduled. If you have any questions or concerns, call our clinic at [phone] or after hours call [phone] and ask for the internal medicine resident on call. Thank you!  Dr. Philipp Ovens

## 2017-12-10 ENCOUNTER — Ambulatory Visit: Payer: Medicare Other | Admitting: Physical Therapy

## 2017-12-10 NOTE — Progress Notes (Signed)
Internal Medicine Clinic Attending  Case discussed with Dr. Guilloud at the time of the visit.  We reviewed the resident's history and exam and pertinent patient test results.  I agree with the assessment, diagnosis, and plan of care documented in the resident's note.  Alexander Raines, M.D., Ph.D.  

## 2017-12-12 ENCOUNTER — Ambulatory Visit: Payer: Medicare Other | Admitting: Physical Therapy

## 2017-12-17 ENCOUNTER — Ambulatory Visit: Payer: Medicare Other | Admitting: Physical Therapy

## 2017-12-17 ENCOUNTER — Encounter: Payer: Self-pay | Admitting: Physical Therapy

## 2017-12-17 DIAGNOSIS — M545 Low back pain, unspecified: Secondary | ICD-10-CM

## 2017-12-17 DIAGNOSIS — M62830 Muscle spasm of back: Secondary | ICD-10-CM

## 2017-12-17 DIAGNOSIS — M5441 Lumbago with sciatica, right side: Secondary | ICD-10-CM | POA: Diagnosis not present

## 2017-12-17 DIAGNOSIS — R262 Difficulty in walking, not elsewhere classified: Secondary | ICD-10-CM

## 2017-12-17 DIAGNOSIS — M62838 Other muscle spasm: Secondary | ICD-10-CM | POA: Diagnosis not present

## 2017-12-17 DIAGNOSIS — M6281 Muscle weakness (generalized): Secondary | ICD-10-CM | POA: Diagnosis not present

## 2017-12-17 DIAGNOSIS — M25651 Stiffness of right hip, not elsewhere classified: Secondary | ICD-10-CM | POA: Diagnosis not present

## 2017-12-17 NOTE — Patient Instructions (Signed)
Bridges with clamshells -green t-band 2x8 Ab set progression with bil hip flexion holds 1x10 Standing hip abduction 2x5 bil

## 2017-12-17 NOTE — Therapy (Addendum)
[facility] [place], [place], [address] Phone: [phone]   Fax:  [phone]  Physical Therapy Treatment  Patient Details  Name: Jordan [name] MRN: [id number] Date of Birth: [birth year] Referring Provider (PT): Candace Gallus    Encounter Date: 12/17/2017  PT End of Session - 12/17/17 1343    Visit Number  6    Number of Visits  16    Date for PT Re-Evaluation  01/15/18    Authorization Type  [employer] MCR     PT Start Time  1100    PT Stop Time  1145    PT Time Calculation (min)  45 min    Activity Tolerance  Patient tolerated treatment well    Behavior During Therapy  [facility] for tasks assessed/performed       Past Medical History:  Diagnosis Date  . Allergic rhinitis    pt takes flonase for episodes, no episodes in 2012  . Anxiety   . Basal cell carcinoma    recurrent in left maxillary, has had 5 surguries, last one 2003  . Basal cell nevus syndrome    dx age 78  . Bipolar 1 disorder ([place])   . Candidiasis, vagina    recurrent, pt has made lifestyle modifications and none recently (as of 8/12)  . Cyst of nasal sinus   . Fibroma    left ovarian  . Fibromyalgia   . Gardnerella infection    + in 08/08  . GERD (gastroesophageal reflux disease)    occ  . Herniated disc   . OVARIAN CYSTECTOMY, HX OF 03/01/2006   ovarian fibroma-left 1996, L ovary and fallopian tube removed    . Paronychia of third finger of left hand 07/28/2010  . PLANTAR FASCIITIS, BILATERAL 05/24/2009  . PTSD (post-traumatic stress disorder)   . Whitlow    hx of herpetic requiring I+D,( was bitten by autistic child that cares fr    Past Surgical History:  Procedure Laterality Date  . CHOLECYSTECTOMY    . LEFT OOPHORECTOMY    . LUMBAR LAMINECTOMY/DECOMPRESSION MICRODISCECTOMY N/A 08/14/2012   Procedure: LUMBAR LAMINECTOMY/DECOMPRESSION MICRODISCECTOMY Lumbar 5 -sacrum 1 decompression;  Surgeon: Sinclair Ship, MD;  Location: [facility];   Service: Orthopedics;  Laterality: N/A;  Lumbar 5 -sacrum 1 decompression  . MANDIBLE RECONSTRUCTION  89   upper anfd lower jaw cyst  . NASAL SINUS SURGERY     X 2 at age 3 & 17    There were no vitals filed for this visit.  Subjective Assessment - 12/17/17 1354    Subjective  Significant pain last week on day of appt. which is why I canceled. Felt like pain I experienced with previous injury, but only lasted a day in front of hip and wrapped around to back of hip. Not sure what I did differently to cause pain, possibly sleeping position.     Limitations  Sitting;Lifting;Other (comment)    How long can you sit comfortably?  less than a minute    How long can you stand comfortably?  3o minutes     How long can you walk comfortably?  15 minutes     Diagnostic tests  X-ray on lumbar regoin; MD says no change     Patient Stated Goals  Sit longer with pain relief; change positoins without pain; get back to kickball and softball     Currently in Pain?  Yes    Pain Score  2     Pain  Location  Back    Pain Orientation  Mid;Left;Lower    Pain Descriptors / Indicators  Tightness;Spasm;Sore    Pain Type  Chronic pain    Pain Radiating Towards  no radiating symptoms     Pain Onset  More than a month ago    Pain Frequency  Intermittent    Aggravating Factors   sitting or standing     Pain Relieving Factors  nothing    Effect of Pain on Daily Activities  "don't want to do anything"    Multiple Pain Sites  No                       [facility] Adult PT Treatment/Exercise - 12/17/17 0001      Lumbar Exercises: Stretches   Passive Hamstring Stretch  Left;Right;30 seconds;2 reps    Piriformis Stretch  Right;Left;3 reps;20 seconds      Lumbar Exercises: Aerobic   Nustep  5 min L2      Lumbar Exercises: Standing   Other Standing Lumbar Exercises  standing bil hip marches 1x10; hip abduction 2x5 Bil      Lumbar Exercises: Supine   Other Supine Lumbar Exercises  core stabilization  phase 1 with hip marches 1x10 progresion with bil hip flexion holds 1x10 ; bridges into resisted clamshells with green t-band 2x10; ball squeezes 1x15      Manual Therapy   Manual Therapy  Soft tissue mobilization;Manual Traction    Soft tissue mobilization  L paraspinals and gluts; trigger point release to glut med    Manual Traction  long axis hip distraction in supine        Verbally instructed pt to continue with open books for mid back tightness 1x5 Bil 5 sec holds       PT Education - 12/17/17 1359    Education Details  New HEP; stretches more frequently throughout day     Person(s) Educated  Patient    Methods  Explanation;Demonstration;Tactile cues;Verbal cues    Comprehension  Verbalized understanding;Returned demonstration;Verbal cues required       PT Short Term Goals - 12/05/17 1132      PT SHORT TERM GOAL #1   Title  Patient will increase lumbar flexion by 25 %     Baseline  full bilateral     Time  4    Period  Weeks    Status  On-going      PT SHORT TERM GOAL #2   Title  Patient will report decreased radicualr symptoms into right leg with centralized pain < 3/10     Baseline  no longer has radicular pain    Time  4    Period  Weeks    Status  Achieved      PT SHORT TERM GOAL #3   Title  Patient will increase gross bilateral lower extremity strength to 4+/5     Time  4    Period  Weeks    Status  Unable to assess      PT SHORT TERM GOAL #4   Title  Patient will be independent with inital stretching and strengthening program.     Time  4    Period  Weeks    Status  Achieved        PT Long Term Goals - 11/20/17 1400      PT LONG TERM GOAL #1   Title  Patient will sit for 1 hour without increased pain in order  to perfrom work tasks     Time  8    Period  Weeks    Status  New    Target Date  01/15/18      PT LONG TERM GOAL #2   Title  Patient will stand for 1 hour without pain in order to perform ADL's     Time  8    Period  Weeks    Status   New    Target Date  01/15/18      PT LONG TERM GOAL #3   Title  Patient will be independent with complete exercise program that allows her to go back to softball and kickball    Time  8    Period  Weeks    Status  New    Target Date  01/15/18            Plan - 12/17/17 1344    Clinical Impression Statement  Overall low back pain has improved but had a spontaneous flare up of pain in front and medial hip that she experienced with injury a year ago. Hip pain did not last longer than a day and hasn't occured again since.  Manual trigger point release to L glut med performed with decresaed tightness noted after. Advanced core exercises in supine and hip strengthening in standing. Pt instructed to discontinue new exercises if increased pain.     History and Personal Factors relevant to plan of care:  lumbar microdiscetomy, PTSD    Clinical Presentation  Evolving    Clinical Presentation due to:  pain that is progressively effecting her mobility     Clinical Decision Making  Moderate    Rehab Potential  Good    PT Frequency  2x / week    PT Duration  8 weeks    PT Treatment/Interventions  ADLs/Self Care Home Management;Cryotherapy;Electrical Stimulation;Iontophoresis 4mg /ml Dexamethasone;Moist Heat;Ultrasound;Gait training;Stair training;Therapeutic activities;Therapeutic exercise;Patient/family education;Neuromuscular re-education;Manual techniques;Dry needling;Passive range of motion;Taping    PT Next Visit Plan  dead bug, progress core strengthening, standing hip, squats, cybex machine    PT Home Exercise Plan  Continue LTR and tennis ball release, open books, thomas test stretch ; core stabilization progression, bridges with clamshells     Consulted and Agree with Plan of Care  Patient       Patient will benefit from skilled therapeutic intervention in order to improve the following deficits and impairments:  Pain, Postural dysfunction, Decreased activity tolerance, Decreased range of  motion, Decreased strength, Increased muscle spasms, Abnormal gait, Difficulty walking  Visit Diagnosis: Acute left-sided low back pain without sciatica  Muscle spasm of back  Weakness of trunk musculature  Stiffness of joint of right pelvic region and thigh  Difficulty in walking, not elsewhere classified  Other muscle spasm     Problem List Patient Active Problem List   Diagnosis Date Noted  . Nausea 12/05/2017  . Vaginal discharge 10/03/2017  . Plantar fasciitis 07/18/2017  . Fall 07/12/2017  . Generalized abdominal pain 03/23/2017  . Right knee pain 11/30/2016  . BPPV (benign paroxysmal positional vertigo) 03/01/2016  . Post traumatic stress disorder (PTSD) 11/09/2015  . Hereditary and idiopathic peripheral neuropathy 09/29/2015  . Bipolar disorder ([place]) 07/04/2015  . Idiopathic hypotension 02/03/2015  . At risk for abnormal blood glucose level 12/15/2013  . Preventative health care 12/19/2011  . Fibromyalgia 06/26/2010  . Insomnia 06/26/2010  . Chronic fatigue and depression 06/26/2010  . Obesity 01/17/2010    Class: Chronic  . Lumbar disc  herniation of L5-S1 on the left side (MRI 2012 s/p surgery)  02/26/2008  . Allergic rhinitis 07/24/2006  . Gastroesophageal reflux disease 03/04/2006  . 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   Jordan [name] PT DPT  12/16/2017  Jordan [name] SPT 12/17/2017, 2:10 PM   During this treatment session, the therapist was present, participating in and directing the treatment.   [place], [place], [address] Phone: [phone]   Fax:  [phone]  Name: Jordan [name] MRN: [id number] Date of Birth: 27-Jun-1980

## 2017-12-19 ENCOUNTER — Encounter: Payer: Self-pay | Admitting: Physical Therapy

## 2017-12-19 ENCOUNTER — Ambulatory Visit: Payer: Medicare Other | Admitting: Physical Therapy

## 2017-12-19 DIAGNOSIS — M5441 Lumbago with sciatica, right side: Secondary | ICD-10-CM | POA: Diagnosis not present

## 2017-12-19 DIAGNOSIS — M25651 Stiffness of right hip, not elsewhere classified: Secondary | ICD-10-CM | POA: Diagnosis not present

## 2017-12-19 DIAGNOSIS — M62830 Muscle spasm of back: Secondary | ICD-10-CM | POA: Diagnosis not present

## 2017-12-19 DIAGNOSIS — M545 Low back pain, unspecified: Secondary | ICD-10-CM

## 2017-12-19 DIAGNOSIS — M6281 Muscle weakness (generalized): Secondary | ICD-10-CM | POA: Diagnosis not present

## 2017-12-19 DIAGNOSIS — R262 Difficulty in walking, not elsewhere classified: Secondary | ICD-10-CM | POA: Diagnosis not present

## 2017-12-19 DIAGNOSIS — M62838 Other muscle spasm: Secondary | ICD-10-CM | POA: Diagnosis not present
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
## 2017-12-20 ENCOUNTER — Ambulatory Visit (INDEPENDENT_AMBULATORY_CARE_PROVIDER_SITE_OTHER): Payer: Medicare Other | Admitting: Internal Medicine

## 2017-12-20 ENCOUNTER — Encounter: Payer: Self-pay | Admitting: Internal Medicine

## 2017-12-20 VITALS — BP 104/55 | HR 59 | Temp 97.9°F | Wt 247.7 lb

## 2017-12-20 DIAGNOSIS — R1031 Right lower quadrant pain: Secondary | ICD-10-CM

## 2017-12-20 DIAGNOSIS — M797 Fibromyalgia: Secondary | ICD-10-CM

## 2017-12-20 DIAGNOSIS — R1032 Left lower quadrant pain: Secondary | ICD-10-CM

## 2017-12-20 DIAGNOSIS — N946 Dysmenorrhea, unspecified: Secondary | ICD-10-CM | POA: Insufficient documentation

## 2017-12-20 DIAGNOSIS — Z79899 Other long term (current) drug therapy: Secondary | ICD-10-CM

## 2017-12-20 DIAGNOSIS — Z90721 Acquired absence of ovaries, unilateral: Secondary | ICD-10-CM

## 2017-12-20 NOTE — Assessment & Plan Note (Addendum)
Jordan [name] presents complaining of painful menstrual periods and heavy bleeding for the last 2-3 months.  She underwent a left oophorectomy when she was 37 years old.  A TVUS on 2010 showed a hypoechoic mass on the right ovary that resolved on repeat TVUS 1 month later as well as small fibroids.  She has not had follow-up since then.  She saw her regular OB/GYN physician on 10/8 and had a TVUS for further evaluation, but has not been contacted about the results.  She expressed dissatisfaction with her care and would like a referral to OB/GYN to establish with a different practice.  - OB/GYN referral

## 2017-12-20 NOTE — Patient Instructions (Addendum)
Jordan [name],   I placed a new referral to OB/GYN. They should be giving you a call to schedule an appointment. If you have not received a call in a week give them a call to schedule your appointment.  Please make sure to let them know that you have been seen in the different office so they can request your records and have all the information by the time they see you.  Please let me know if I can be of assistance during this process.  You received your flu shot today.  Come back to see me in 6 months or sooner if you need to.  Please call if you have any questions or concerns.  - Dr. Frederico Hamman
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
## 2017-12-20 NOTE — Assessment & Plan Note (Signed)
Jordan [name] has a history of fibromyalgia that has been well controlled on Lyrica to 25 twice daily.  2 weeks ago she experienced a fibromyalgia flare after being unable to refill her medicine due to [facility] running out of the medication.  She was able to resume her medication 1.5 weeks ago and reports significant improvement in joint pain.  - Continue current regimen

## 2017-12-26 ENCOUNTER — Ambulatory Visit (INDEPENDENT_AMBULATORY_CARE_PROVIDER_SITE_OTHER): Payer: Medicare Other | Admitting: Licensed Clinical Social Worker

## 2017-12-26 ENCOUNTER — Encounter (HOSPITAL_COMMUNITY): Payer: Self-pay | Admitting: Licensed Clinical Social Worker

## 2017-12-26 DIAGNOSIS — F3162 Bipolar disorder, current episode mixed, moderate: Secondary | ICD-10-CM

## 2017-12-26 NOTE — Progress Notes (Signed)
   THERAPIST PROGRESS NOTE  Session Time: 2:30pm-3:30pm  Participation Level: Active  Behavioral Response: Well GroomedAlertEuthymic  Type of Therapy: Individual Therapy  Treatment Goals addressed: improve psychiatric symptoms, controlled behavior, moderate mood, and deliberate speech (improved social and work functioning and decrease impulsive behavior), improve unhelpful thought patterns, elevate mood (increased hopefulness and social interactions), interpersonal relationship skills, discuss and process trauma, learn about diagnosis, healthy coping skills  Interventions: Motivational Interviewing, CBT, Grounding and Mindfulness Techniques   Summary: Jordan [name] is a 37 y.o. female who presents with Moderate mixed bipolar I disorder   Suicidal/Homicidal: No -without intent/plan  Therapist Response:  Jordan [name] met with clinician for an individual session. Jordan [name] discussed her psychiatric symptoms, her current life events and her homework. Jordan [name] shared that she has been enjoying her peaceful life. She reports not getting involved emotionally or physically with her mother, who is running the streets, or her sister, who is using drugs. 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 Clinician processed thoughts and feelings about the changes in her life and noted contentment and happiness for the first time in a while. Jordan [name] reports she has been trying to improve her facial expressions, especially at church. Clinician identified the challenge when she knows the truth about someone and they are acting completely different.   Plan: Return again in 2 weeks.  Diagnosis: Axis I: Moderate mixed bipolar I disorder    [name], LCSW 12/26/2017

## 2017-12-30 ENCOUNTER — Ambulatory Visit: Payer: Medicare Other | Attending: Orthopedic Surgery | Admitting: Physical Therapy

## 2017-12-30 ENCOUNTER — Encounter: Payer: Self-pay | Admitting: Physical Therapy

## 2017-12-30 DIAGNOSIS — M545 Low back pain, unspecified: Secondary | ICD-10-CM

## 2017-12-30 DIAGNOSIS — M62830 Muscle spasm of back: Secondary | ICD-10-CM | POA: Diagnosis not present
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
## 2017-12-30 NOTE — Progress Notes (Signed)
Internal Medicine Clinic Attending  Case discussed with Dr. Santos-Sanchez at the time of the visit.  We reviewed the resident's history and exam and pertinent patient test results.  I agree with the assessment, diagnosis, and plan of care documented in the resident's note.    

## 2018-01-08 ENCOUNTER — Ambulatory Visit: Payer: Medicare Other | Admitting: Physical Therapy

## 2018-01-09 ENCOUNTER — Encounter (HOSPITAL_COMMUNITY): Payer: Self-pay | Admitting: Licensed Clinical Social Worker

## 2018-01-09 ENCOUNTER — Ambulatory Visit (INDEPENDENT_AMBULATORY_CARE_PROVIDER_SITE_OTHER): Payer: Medicare Other | Admitting: Licensed Clinical Social Worker

## 2018-01-09 DIAGNOSIS — F3162 Bipolar disorder, current episode mixed, moderate: Secondary | ICD-10-CM | POA: Diagnosis not present

## 2018-01-09 NOTE — Progress Notes (Signed)
   THERAPIST PROGRESS NOTE  Session Time: 9:15am-10:00am  Participation Level: Active  Behavioral Response: Well GroomedAlertDepressed  Type of Therapy: Individual Therapy  Treatment Goals addressed: improve psychiatric symptoms, controlled behavior, moderate mood, and deliberate speech (improved social and work functioning and decrease impulsive behavior), improve unhelpful thought patterns, elevate mood (increased hopefulness and social interactions), interpersonal relationship skills, discuss and process trauma, learn about diagnosis, healthy coping skills  Interventions: Motivational Interviewing, CBT, Grounding and Mindfulness Techniques   Summary: Jordan [name] is a 37 y.o. female who presents with Moderate mixed bipolar I disorder   Suicidal/Homicidal: No -without intent/plan  Therapist Response:  Jordan [name] met with clinician for an individual session. Jordan [name] discussed her psychiatric symptoms, her current life events and her homework. Jordan [name] shared that she has been feeling kind of down lately. She reports concern that her family members were being judgmental about her "lifestyle choice" rather than just accepting and loving her for who is is. Clinician processed thoughts and feelings. Clinician validated feelings and noted that regardless of their judgement of her, they do still love her, and it is up to her to choose her level of involvement. Clinician discussed boundaries and noted the importance of being able to maintain those boundaries for everyone's health. Clinician also discussed who she chooses to be in her "family".  Jordan [name] identified that she had not been consistent with her medications. Clinician encouraged Jordan [name] to make sure she has her medications daily and to make sure she keeps an emergency stash in her bag or in her car for when she goes to work.   Plan: Return again in 2 weeks.  Diagnosis: Axis I: Moderate mixed bipolar I disorder   [name],  LCSW 01/09/2018

## 2018-01-13 ENCOUNTER — Ambulatory Visit: Payer: Medicare Other | Admitting: Physical Therapy

## 2018-01-21 ENCOUNTER — Encounter: Payer: Self-pay | Admitting: Internal Medicine

## 2018-01-21 ENCOUNTER — Other Ambulatory Visit: Payer: Self-pay

## 2018-01-21 ENCOUNTER — Ambulatory Visit (INDEPENDENT_AMBULATORY_CARE_PROVIDER_SITE_OTHER): Payer: Medicare Other | Admitting: Internal Medicine

## 2018-01-21 VITALS — BP 88/49 | HR 71 | Temp 98.2°F | Ht 68.0 in | Wt 242.8 lb

## 2018-01-21 DIAGNOSIS — J019 Acute sinusitis, unspecified: Secondary | ICD-10-CM | POA: Diagnosis not present

## 2018-01-21 DIAGNOSIS — B9689 Other specified bacterial agents as the cause of diseases classified elsewhere: Secondary | ICD-10-CM

## 2018-01-21 MED ORDER — AMOXICILLIN-POT CLAVULANATE 875-125 MG PO TABS: 1.0000 | ORAL_TABLET | Freq: Two times a day (BID) | ORAL | 0 refills | Status: AC

## 2018-01-21 NOTE — Assessment & Plan Note (Signed)
Presents with two week history of cough and nasal congestion. States that approximately two weeks ago her initial symptom was a sore throat, myalgias, arthralgias. Her symptoms initially felt like they were improving however approximately 2 to 3 days ago began to feel worse. She now has unilateral sinus pain, a sinus headache, fevers, chills, and continued sore throat. She endorses a productive cough of green/brown sputum. She has had sinus infections in the past but has not had one in the last 1-2 years. She does not have any allergies to any antibiotics. She is tried over-the-counter Robitussin and DayQuil without significant relief.  Physical exam is significant for unilateral sinus tenderness. Based on her history and physical exam findings think she likely has acute bacterial sinusitis. I will send out a prescription for Augmentin 875 BID for five days. She will follow-up with the [facility] or her PCP if her symptoms are not improving

## 2018-01-21 NOTE — Progress Notes (Signed)
   CC: Cough  HPI:  [name] is a 37 y.o. female who presented to the clinic for evaluation and management of a cough. For a detailed assessment and plan please refer to problem based charting below.   Past Medical History:  Diagnosis Date  . Allergic rhinitis    pt takes flonase for episodes, no episodes in 2012  . Anxiety   . Basal cell carcinoma    recurrent in left maxillary, has had 5 surguries, last one 2003  . Basal cell nevus syndrome    dx age 75  . Bipolar 1 disorder ([place])   . Candidiasis, vagina    recurrent, pt has made lifestyle modifications and none recently (as of 8/12)  . Cyst of nasal sinus   . Fibroma    left ovarian  . Fibromyalgia   . Gardnerella infection    + in 08/08  . GERD (gastroesophageal reflux disease)    occ  . Herniated disc   . OVARIAN CYSTECTOMY, HX OF 03/01/2006   ovarian fibroma-left 1996, L ovary and fallopian tube removed    . Paronychia of third finger of left hand 07/28/2010  . PLANTAR FASCIITIS, BILATERAL 05/24/2009  . PTSD (post-traumatic stress disorder)   . [place]low    hx of herpetic requiring I+D,( was bitten by autistic child that cares fr   Review of Systems:  12 point ROS preformed. All negative aside from those mentioned in the HPI.  Physical Exam: Vitals:   01/21/18 1435  BP: (!) 88/49  Pulse: 71  Temp: 98.2 F (36.8 C)  TempSrc: Oral  SpO2: 100%  Weight: 242 lb 12.8 oz (110.1 kg)  Height: 5\' 8"  (1.727 m)   General: Well nourished female, appears ill HENT: TM clear with good light reflexes, erythematous nares, unilateral sinus tenderness (right maxillary sinus), erythema of the oropharynx  Pulm: Good air movement with no wheezing or crackles  CV: RRR, no murmurs, no rubs  Abdomen: Active bowel sounds, soft, non-distended, no tenderness to palpation   Assessment & Plan:   See Encounters Tab for problem based charting.  Patient discussed with Dr. Angelia Mould

## 2018-01-21 NOTE — Patient Instructions (Signed)
Thank you for allowing [place] to provide your care. I have sent out an antibiotic. Please take this as prescribed. Please give [place] a call if you are not improving within the next 4-5 days.   Sinusitis, Adult Sinusitis is soreness and inflammation of your sinuses. Sinuses are hollow spaces in the bones around your face. They are located:  Around your eyes.  In the middle of your forehead.  Behind your nose.  In your cheekbones.  Your sinuses and nasal passages are lined with a stringy fluid (mucus). Mucus normally drains out of your sinuses. When your nasal tissues get inflamed or swollen, the mucus can get trapped or blocked so air cannot flow through your sinuses. This lets bacteria, viruses, and funguses grow, and that leads to infection. Follow these instructions at home: Medicines  Take, use, or apply over-the-counter and prescription medicines only as told by your doctor. These may include nasal sprays.  If you were prescribed an antibiotic medicine, take it as told by your doctor. Do not stop taking the antibiotic even if you start to feel better. Hydrate and Humidify  Drink enough water to keep your pee (urine) clear or pale yellow.  Use a cool mist humidifier to keep the humidity level in your home above 50%.  Breathe in steam for 10-15 minutes, 3-4 times a day or as told by your doctor. You can do this in the bathroom while a hot shower is running.  Try not to spend time in cool or dry air. Rest  Rest as much as possible.  Sleep with your head raised (elevated).  Make sure to get enough sleep each night. General instructions  Put a warm, moist washcloth on your face 3-4 times a day or as told by your doctor. This will help with discomfort.  Wash your hands often with soap and water. If there is no soap and water, use hand sanitizer.  Do not smoke. Avoid being around people who are smoking (secondhand smoke).  Keep all follow-up visits as told by your doctor. This is  important. Contact a doctor if:  You have a fever.  Your symptoms get worse.  Your symptoms do not get better within 10 days. Get help right away if:  You have a very bad headache.  You cannot stop throwing up (vomiting).  You have pain or swelling around your face or eyes.  You have trouble seeing.  You feel confused.  Your neck is stiff.  You have trouble breathing. This information is not intended to replace advice given to you by your health care provider. Make sure you discuss any questions you have with your health care provider. Document Released: 07/25/2007 Document Revised: 10/02/2015 Document Reviewed: 12/01/2014 Elsevier Interactive Patient Education  [employer].

## 2018-01-22 DIAGNOSIS — N945 Secondary dysmenorrhea: Secondary | ICD-10-CM | POA: Diagnosis not present

## 2018-01-23 ENCOUNTER — Ambulatory Visit (INDEPENDENT_AMBULATORY_CARE_PROVIDER_SITE_OTHER): Payer: Medicare Other | Admitting: Licensed Clinical Social Worker

## 2018-01-23 ENCOUNTER — Encounter (HOSPITAL_COMMUNITY): Payer: Self-pay | Admitting: Licensed Clinical Social Worker

## 2018-01-23 DIAGNOSIS — F3162 Bipolar disorder, current episode mixed, moderate: Secondary | ICD-10-CM | POA: Diagnosis not present

## 2018-01-23 NOTE — Progress Notes (Signed)
   THERAPIST PROGRESS NOTE  Session Time: 9:05am-10:00am  Participation Level: Active  Behavioral Response: Well GroomedAlertEuthymic  Type of Therapy: Individual Therapy  Treatment Goals addressed: improve psychiatric symptoms, controlled behavior, moderate mood, and deliberate speech (improved social and work functioning and decrease impulsive behavior), improve unhelpful thought patterns, elevate mood (increased hopefulness and social interactions), interpersonal relationship skills, discuss and process trauma, learn about diagnosis, healthy coping skills  Interventions: Motivational Interviewing, CBT, Grounding and Mindfulness Techniques   Summary: Jordan [name] is a 37 y.o. female who presents with Moderate mixed bipolar I disorder   Suicidal/Homicidal: No -without intent/plan  Therapist Response:  Jordan [name] met with clinician for an individual session. [name] discussed her psychiatric symptoms, her current life events and her homework. [name] shared that she has been sick over the past week, but doing okay. She reported concerns about her mother, who is back "running the streets", as well as concerns about her sister, whose home has become very chaotic. [name] processed her interest in having connections with her family, but noted feeling like an outsider. Clinician processed these thoughts and noted that she is different from her family due to her ability to function at a higher level than her mom and sister. Clinician reviewed thoughts and feelings about previous session, where conversation about who is your family of choice vs who is in your biological family. Clinician normalized the urge to be accepted by family, but also noted the hard work that has been done to keep her independent and stable.   Plan: Return again in 2 week.  Diagnosis: Axis I: Moderate mixed bipolar I disorder    [name], LCSW 01/23/2018

## 2018-01-23 NOTE — Progress Notes (Signed)
Internal Medicine Clinic Attending  Case discussed with Dr. Helberg at the time of the visit.  We reviewed the resident's history and exam and pertinent patient test results.  I agree with the assessment, diagnosis, and plan of care documented in the resident's note.    

## 2018-02-05 ENCOUNTER — Other Ambulatory Visit: Payer: Self-pay | Admitting: *Deleted

## 2018-02-05 DIAGNOSIS — M797 Fibromyalgia: Secondary | ICD-10-CM

## 2018-02-05 MED ORDER — PREGABALIN 225 MG PO CAPS: 225.0000 mg | ORAL_CAPSULE | Freq: Two times a day (BID) | ORAL | 2 refills | Status: DC

## 2018-02-11 ENCOUNTER — Encounter (HOSPITAL_COMMUNITY): Payer: Self-pay | Admitting: Licensed Clinical Social Worker

## 2018-02-11 ENCOUNTER — Encounter

## 2018-02-11 ENCOUNTER — Ambulatory Visit (INDEPENDENT_AMBULATORY_CARE_PROVIDER_SITE_OTHER): Payer: Medicare Other | Admitting: Licensed Clinical Social Worker

## 2018-02-11 DIAGNOSIS — F3162 Bipolar disorder, current episode mixed, moderate: Secondary | ICD-10-CM | POA: Diagnosis not present

## 2018-02-11 NOTE — Progress Notes (Signed)
   THERAPIST PROGRESS NOTE  Session Time: 9:45am-10:40am  Participation Level: Active  Behavioral Response: Well GroomedAlerttired  Type of Therapy: Individual Therapy  Treatment Goals addressed: improve psychiatric symptoms, controlled behavior, moderate mood, and deliberate speech (improved social and work functioning and decrease impulsive behavior), improve unhelpful thought patterns, elevate mood (increased hopefulness and social interactions), interpersonal relationship skills, discuss and process trauma, learn about diagnosis, healthy coping skills  Interventions: Motivational Interviewing, CBT, Grounding and Mindfulness Techniques   Summary: Jordan [name] is a 37 y.o. female who presents with Moderate mixed bipolar I disorder   Suicidal/Homicidal: No -without intent/plan  Therapist Response:  Jordan [name] met with clinician for an individual session. Jordan [name] discussed her psychiatric symptoms, her current life events and her homework. Jordan [name] shared that she has been having increased pain due to stress, fibromyalgia, and increasing workouts. Clinician processed stressors and noted many concerns about her mother and sister. Jordan [name] identified that she knows she cannot do anything about their situations, but she still feels sad that they are struggling. She identified other frustrating things going on in life, including her job and her house. Clinician utilized [place] OARS to reflect and summarize thoughts and feelings. Clinician explored options. Jordan [name] reports she wants to move in 2020. Clinician reviewed tasks that would need to be completed including transferring [place] [employer], as well as social security. Jordan [name] reports she has already started researching and has concerns about transferring doctors, as well as guilt if anything happens to her mother when she is gone. Clinician challenged that thought and identified that she has no control even if she was here.   Plan: Return again in 2  week.  Diagnosis: Axis I: Moderate mixed bipolar I disorder   [name], LCSW 02/11/2018

## 2018-02-25 ENCOUNTER — Encounter (HOSPITAL_COMMUNITY): Payer: Self-pay | Admitting: Licensed Clinical Social Worker

## 2018-02-25 ENCOUNTER — Ambulatory Visit (INDEPENDENT_AMBULATORY_CARE_PROVIDER_SITE_OTHER): Payer: Medicare Other | Admitting: Licensed Clinical Social Worker

## 2018-02-25 ENCOUNTER — Other Ambulatory Visit (HOSPITAL_COMMUNITY): Payer: Self-pay

## 2018-02-25 DIAGNOSIS — F3132 Bipolar disorder, current episode depressed, moderate: Secondary | ICD-10-CM

## 2018-02-25 DIAGNOSIS — F3162 Bipolar disorder, current episode mixed, moderate: Secondary | ICD-10-CM | POA: Diagnosis not present

## 2018-02-25 MED ORDER — DIVALPROEX SODIUM ER 250 MG PO TB24: 750.0000 mg | ORAL_TABLET | Freq: Every day | ORAL | 0 refills | Status: DC

## 2018-02-25 NOTE — Progress Notes (Signed)
   THERAPIST PROGRESS NOTE  Session Time: 9:05am-10:00am  Participation Level: Active  Behavioral Response: Well GroomedAlertEuthymic  Type of Therapy: Individual Therapy  Treatment Goals addressed: improve psychiatric symptoms, controlled behavior, moderate mood, and deliberate speech (improved social and work functioning and decrease impulsive behavior), improve unhelpful thought patterns, elevate mood (increased hopefulness and social interactions), interpersonal relationship skills, discuss and process trauma, learn about diagnosis, healthy coping skills  Interventions: Motivational Interviewing, CBT, Grounding and Mindfulness Techniques   Summary: Jordan [name] is a 38 y.o. female who presents with Moderate mixed bipolar I disorder   Suicidal/Homicidal: No -without intent/plan  Therapist Response:  Jordan [name] met with clinician for an individual session. Jordan [name] discussed her psychiatric symptoms, her current life events and her homework. Jordan [name] shared a great deal of stress with friends, family, and with her job. Clinician explored concerns about her friends' health problems, as well as worries about her mother. She reports mother is out in the streets using drugs. Clinician explored the level of responsibility Jordan [name] is taking on for others. Jordan [name] identified her increasing ability to say no when people ask her to do things for them. Clinician provided feedback about the difference between helping and enabling. Clinician also identified the importance of "referring out" when people ask her to do things that she cannot or does not want to do, as it will interfere with her life. Jordan [name] reports she still wants to move to [facility]. Clinician identified making a plan, including contacting her case worker through housing to transfer her case, updating certification as CMA, and starting to look at jobs.   Plan: Return again in 2 weeks.  Diagnosis: Axis I: Moderate mixed bipolar I disorder   [name], LCSW 02/25/2018

## 2018-03-07 ENCOUNTER — Other Ambulatory Visit: Payer: Self-pay | Admitting: Internal Medicine

## 2018-03-07 ENCOUNTER — Other Ambulatory Visit: Payer: Self-pay

## 2018-03-07 DIAGNOSIS — M797 Fibromyalgia: Secondary | ICD-10-CM

## 2018-03-07 MED ORDER — PREGABALIN 150 MG PO CAPS: 150.0000 mg | ORAL_CAPSULE | Freq: Two times a day (BID) | ORAL | 2 refills | Status: DC

## 2018-03-07 NOTE — Telephone Encounter (Signed)
Returned called to patient. Jordan [name] reports she has been doing very well the past 2 months. She has had very minimal joint pain and has been significantly more active than usual. She is working out 3x/week without any difficulties. She is requesting a decrease in her Lyrica dose, which was increased a few months ago due to increased joint pain. I do not see any contraindications for a dose reduction at this time. Refilled Lyrica 150mg  BID (#60 tablets + 2RF). Next refill due on 06/06/2018.   [name], MD  Internal Medicine PGY-2  P [phone]

## 2018-03-07 NOTE — Telephone Encounter (Signed)
Needs to speak with a nurse about pregabalin (LYRICA) 225 MG capsule. Please call pt back.

## 2018-03-07 NOTE — Telephone Encounter (Signed)
Lm for rtc 

## 2018-03-07 NOTE — Telephone Encounter (Signed)
Patient called back, states she is running out of lyrica. She is currently taking 225 mg BID but would like to return to previous dosage (she is unsure what that dosage was). She made first available f/u appt with PCP for 03/21/2018. She requests refill be sent to [facility] on [address]. Hubbard Hartshorn, RN, BSN

## 2018-03-10 NOTE — Therapy (Signed)
[place], [place], [address] Phone: [phone]   Fax:  [phone]  Physical Therapy Treatment/Discharge Summary  Patient Details  Name: Jordan [name] MRN: [id number] Date of Birth: 27-May-1980 Referring Provider (PT): Candace Gallus    Encounter Date: 12/30/2017    Past Medical History:  Diagnosis Date  . Allergic rhinitis    pt takes flonase for episodes, no episodes in 2012  . Anxiety   . Basal cell carcinoma    recurrent in left maxillary, has had 5 surguries, last one 2003  . Basal cell nevus syndrome    dx age 33  . Bipolar 1 disorder ([place])   . Candidiasis, vagina    recurrent, pt has made lifestyle modifications and none recently (as of 8/12)  . Cyst of nasal sinus   . Fibroma    left ovarian  . Fibromyalgia   . Gardnerella infection    + in 08/08  . GERD (gastroesophageal reflux disease)    occ  . Herniated disc   . OVARIAN CYSTECTOMY, HX OF 03/01/2006   ovarian fibroma-left 1996, L ovary and fallopian tube removed    . Paronychia of third finger of left hand 07/28/2010  . PLANTAR FASCIITIS, BILATERAL 05/24/2009  . PTSD (post-traumatic stress disorder)   . [place]    hx of herpetic requiring I+D,( was bitten by autistic child that cares fr    Past Surgical History:  Procedure Laterality Date  . CHOLECYSTECTOMY    . LEFT OOPHORECTOMY    . LUMBAR LAMINECTOMY/DECOMPRESSION MICRODISCECTOMY N/A 08/14/2012   Procedure: LUMBAR LAMINECTOMY/DECOMPRESSION MICRODISCECTOMY Lumbar 5 -sacrum 1 decompression;  Surgeon: Sinclair Ship, MD;  Location: [facility];  Service: Orthopedics;  Laterality: N/A;  Lumbar 5 -sacrum 1 decompression  . MANDIBLE RECONSTRUCTION  89   upper anfd lower jaw cyst  . NASAL SINUS SURGERY     X 2 at age 75 & 66    There were no vitals filed for this visit.                              PT Short Term Goals - 12/19/17 1528      PT SHORT  TERM GOAL #1   Title  Patient will increase lumbar flexion by 25 %     Baseline  full bilateral     Time  4    Period  Weeks    Status  On-going      PT SHORT TERM GOAL #2   Title  Patient will report decreased radicualr symptoms into right leg with centralized pain < 3/10     Baseline  no longer has radicular pain    Status  Achieved      PT SHORT TERM GOAL #3   Title  Patient will increase gross bilateral lower extremity strength to 4+/5     Baseline  no pain     Time  4    Period  Weeks    Status  On-going      PT SHORT TERM GOAL #4   Title  Patient will be independent with inital stretching and strengthening program.     Status  Achieved        PT Long Term Goals - 11/20/17 1400      PT LONG TERM GOAL #1   Title  Patient will sit for 1 hour without increased pain in order to perfrom work tasks  Time  8    Period  Weeks    Status  New    [place] Date  01/15/18      PT LONG TERM GOAL #2   Title  Patient will stand for 1 hour without pain in order to perform ADL's     Time  8    Period  Weeks    Status  New    Target Date  01/15/18      PT LONG TERM GOAL #3   Title  Patient will be independent with complete exercise program that allows her to go back to softball and kickball    Time  8    Period  Weeks    Status  New    Target Date  01/15/18              Patient will benefit from skilled therapeutic intervention in order to improve the following deficits and impairments:  Pain, Postural dysfunction, Decreased activity tolerance, Decreased range of motion, Decreased strength, Increased muscle spasms, Abnormal gait, Difficulty walking  Visit Diagnosis: Acute left-sided low back pain without sciatica  Muscle spasm of back     Problem List Patient Active Problem List   Diagnosis Date Noted  . Painful menstrual periods 12/20/2017  . Nausea 12/05/2017  . Plantar fasciitis 07/18/2017  . Right knee pain 11/30/2016  . BPPV (benign paroxysmal  positional vertigo) 03/01/2016  . Post traumatic stress disorder (PTSD) 11/09/2015  . Hereditary and idiopathic peripheral neuropathy 09/29/2015  . Bipolar disorder ([place]) 07/04/2015  . Idiopathic hypotension 02/03/2015  . At risk for abnormal blood glucose level 12/15/2013  . Preventative health care 12/19/2011  . Fibromyalgia 06/26/2010  . Insomnia 06/26/2010  . Chronic fatigue and depression 06/26/2010  . Obesity 01/17/2010    Class: Chronic  . Acute bacterial sinusitis 06/01/2009  . Lumbar disc herniation of L5-S1 on the left side (MRI 2012 s/p surgery)  02/26/2008  . Allergic rhinitis 07/24/2006  . Gastroesophageal reflux disease 03/04/2006  . 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   Remaining deficits: Unknown   Education / Equipment: NA  Plan:                                                    Patient goals were not met. Patient is being discharged due to not returning since the last visit.  ?????           Beaulah Dinning, PT, DPT 03/10/18 1:06 PM      [facility] [address] [place], [place], [address] Phone: [phone]   Fax:  [phone]  Name: Jordan [name] MRN: [id number] Date of Birth: [birth year]

## 2018-03-11 ENCOUNTER — Ambulatory Visit (HOSPITAL_COMMUNITY): Payer: Medicare Other | Admitting: Licensed Clinical Social Worker

## 2018-03-21 ENCOUNTER — Encounter: Payer: Self-pay | Admitting: Internal Medicine

## 2018-03-25 ENCOUNTER — Ambulatory Visit (HOSPITAL_COMMUNITY): Payer: Medicare Other | Admitting: Licensed Clinical Social Worker

## 2018-03-28 ENCOUNTER — Ambulatory Visit (INDEPENDENT_AMBULATORY_CARE_PROVIDER_SITE_OTHER): Payer: Medicare Other | Admitting: Psychiatry

## 2018-03-28 ENCOUNTER — Encounter (HOSPITAL_COMMUNITY): Payer: Self-pay | Admitting: Psychiatry

## 2018-03-28 VITALS — BP 118/72 | HR 92 | Ht 68.0 in | Wt 231.8 lb

## 2018-03-28 DIAGNOSIS — F3132 Bipolar disorder, current episode depressed, moderate: Secondary | ICD-10-CM | POA: Diagnosis not present

## 2018-03-28 DIAGNOSIS — Z79899 Other long term (current) drug therapy: Secondary | ICD-10-CM | POA: Diagnosis not present

## 2018-03-28 DIAGNOSIS — F4310 Post-traumatic stress disorder, unspecified: Secondary | ICD-10-CM

## 2018-03-28 MED ORDER — HYDROXYZINE HCL 25 MG PO TABS: 25.0000 mg | ORAL_TABLET | Freq: Every day | ORAL | 0 refills | Status: DC | PRN

## 2018-03-28 MED ORDER — DIVALPROEX SODIUM ER 250 MG PO TB24: 750.0000 mg | ORAL_TABLET | Freq: Every day | ORAL | 0 refills | Status: DC

## 2018-03-28 NOTE — Addendum Note (Signed)
Addended by: Lethea Killings on: 03/28/2018 10:26 AM   Modules accepted: Orders
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
## 2018-03-31 ENCOUNTER — Ambulatory Visit (INDEPENDENT_AMBULATORY_CARE_PROVIDER_SITE_OTHER): Payer: Medicare Other | Admitting: Licensed Clinical Social Worker

## 2018-03-31 ENCOUNTER — Encounter (HOSPITAL_COMMUNITY): Payer: Self-pay | Admitting: Licensed Clinical Social Worker

## 2018-03-31 DIAGNOSIS — F3162 Bipolar disorder, current episode mixed, moderate: Secondary | ICD-10-CM

## 2018-03-31 NOTE — Progress Notes (Signed)
   THERAPIST PROGRESS NOTE  Session Time: 2:30pm-3:20pm  Participation Level: Active  Behavioral Response: Well GroomedAlertEuthymic  Type of Therapy: Individual Therapy  Treatment Goals addressed: improve psychiatric symptoms, controlled behavior, moderate mood, and deliberate speech (improved social and work functioning and decrease impulsive behavior), improve unhelpful thought patterns, elevate mood (increased hopefulness and social interactions), interpersonal relationship skills, discuss and process trauma, learn about diagnosis, healthy coping skills  Interventions: Motivational Interviewing, CBT, Grounding and Mindfulness Techniques   Summary: KIKUYE KORENEK is a 38 y.o. female who presents with Moderate mixed bipolar I disorder   Suicidal/Homicidal: No -without intent/plan  Therapist Response:  Katrin met with clinician for an individual session. Yancy discussed her psychiatric symptoms, her current life events and her homework. Harmonii shared that she has been doing okay, but feeling some stress from her family and her job. 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 Clinician discussed other concerns about her family, noting worries about mom, sister, and all of the extended family who have moved in to grandmother's house. Latresa discussed her frustrations and decisions about her level of involvement.  Sabah discussed upcoming visit from her "friend" who will be visiting from [place]. Clinician utilized MI OARS to reflect and summarize thoughts and feelings about this visit. Clinician also reality checked about what is possible in this relationship and encouraged Allaya to be cautious.   Plan: Return again in 2 week.  Diagnosis: Axis I: Moderate mixed bipolar I disorder   [name], LCSW 03/31/2018

## 2018-04-01 LAB — HEMOGLOBIN A1C
Est. average glucose Bld gHb Est-mCnc: 114 mg/dL
Hgb A1c MFr Bld: 5.6 % (ref 4.8–5.6)

## 2018-04-01 LAB — COMPREHENSIVE METABOLIC PANEL

## 2018-04-01 LAB — CBC WITH DIFFERENTIAL/PLATELET
Basophils Absolute: 0 10*3/uL (ref 0.0–0.2)
Basos: 0 %
EOS (ABSOLUTE): 0 10*3/uL (ref 0.0–0.4)
Eos: 0 %
Hematocrit: 38.9 % (ref 34.0–46.6)
Hemoglobin: 13.6 g/dL (ref 11.1–15.9)
Immature Grans (Abs): 0 10*3/uL (ref 0.0–0.1)
Immature Granulocytes: 0 %
Lymphocytes Absolute: 2.6 10*3/uL (ref 0.7–3.1)
Lymphs: 58 %
MCH: 29.2 pg (ref 26.6–33.0)
MCHC: 35 g/dL (ref 31.5–35.7)
MCV: 84 fL (ref 79–97)
Monocytes Absolute: 0.5 10*3/uL (ref 0.1–0.9)
Monocytes: 11 %
Neutrophils Absolute: 1.4 10*3/uL (ref 1.4–7.0)
Neutrophils: 31 %
Platelets: 187 10*3/uL (ref 150–450)
RBC: 4.66 x10E6/uL (ref 3.77–5.28)
RDW: 13.2 % (ref 11.7–15.4)
WBC: 4.5 10*3/uL (ref 3.4–10.8)

## 2018-04-01 LAB — TSH: TSH: 1.62 u[IU]/mL (ref 0.450–4.500)

## 2018-04-05 ENCOUNTER — Ambulatory Visit (HOSPITAL_COMMUNITY): Payer: Medicare Other | Admitting: Psychiatry

## 2018-04-07 ENCOUNTER — Ambulatory Visit (HOSPITAL_COMMUNITY): Payer: Medicare Other | Admitting: Psychiatry

## 2018-04-15 ENCOUNTER — Ambulatory Visit (INDEPENDENT_AMBULATORY_CARE_PROVIDER_SITE_OTHER): Payer: Medicare Other | Admitting: Licensed Clinical Social Worker

## 2018-04-15 DIAGNOSIS — F3162 Bipolar disorder, current episode mixed, moderate: Secondary | ICD-10-CM | POA: Diagnosis not present

## 2018-04-16 ENCOUNTER — Encounter (HOSPITAL_COMMUNITY): Payer: Self-pay | Admitting: Licensed Clinical Social Worker

## 2018-04-16 NOTE — Progress Notes (Signed)
   THERAPIST PROGRESS NOTE  Session Time: 9:15am-10:00am  Participation Level: Active  Behavioral Response: Well GroomedAlertIrritable  Type of Therapy: Individual Therapy  Treatment Goals addressed: improve psychiatric symptoms, controlled behavior, moderate mood, and deliberate speech (improved social and work functioning and decrease impulsive behavior), improve unhelpful thought patterns, elevate mood (increased hopefulness and social interactions), interpersonal relationship skills, discuss and process trauma, learn about diagnosis, healthy coping skills  Interventions: Motivational Interviewing, CBT, Grounding and Mindfulness Techniques   Summary: Jordan [name] is a 38 y.o. female who presents with Moderate mixed bipolar I disorder   Suicidal/Homicidal: No -without intent/plan  Therapist Response:  Page met with clinician for an individual session. Jordan [name] discussed her psychiatric symptoms, her current life events and her homework. Jordan [name] shared that she was in a lot of physical pain due to falling in the snow last week. Clinician explored the incident and noted both pain and frustration with her body. Clinician discussed interactions with others and noted some thoughts of moving to [place] to be with her new girlfriend. Clinician challenged and warned Jordan [name] about this decision and encouraged her to remain as independent as possible if she were to make that move. Clinician explored interactions with the new girlfriend and talked about the visit. Clinician also processed recent visit with mom, who is still living in [facility] house with sister, brother in law, niece, brother-in-law's 2 daughters and two babies. Clinician explored safety issues and ensured that everyone was okay. Jordan [name] reported that 41 year old niece asked to move with [name] to [place].   Plan: Return again in 2 week.  Diagnosis: Axis I: Moderate mixed bipolar I disorder    [name], LCSW 04/16/2018

## 2018-04-18 ENCOUNTER — Encounter (HOSPITAL_COMMUNITY): Payer: Self-pay | Admitting: *Deleted

## 2018-04-18 ENCOUNTER — Ambulatory Visit (HOSPITAL_COMMUNITY)
Admission: EM | Admit: 2018-04-18 | Discharge: 2018-04-18 | Disposition: A | Discharge status: Home / Self Care | Payer: Medicare Other | Attending: Physician Assistant | Admitting: Physician Assistant

## 2018-04-18 ENCOUNTER — Other Ambulatory Visit: Payer: Self-pay

## 2018-04-18 DIAGNOSIS — S0502XA Injury of conjunctiva and corneal abrasion without foreign body, left eye, initial encounter: Secondary | ICD-10-CM | POA: Diagnosis not present

## 2018-04-18 MED ORDER — TETRACAINE HCL 0.5 % OP SOLN
OPHTHALMIC | Status: AC
  Filled 2018-04-18: qty 4

## 2018-04-18 MED ORDER — POLYETHYL GLYCOL-PROPYL GLYCOL 0.4-0.3 % OP GEL: 1.0000 "application " | Freq: Every evening | OPHTHALMIC | 0 refills | Status: DC | PRN

## 2018-04-18 MED ORDER — OFLOXACIN 0.3 % OP SOLN: OPHTHALMIC | 0 refills | Status: DC

## 2018-04-18 MED ORDER — FLUORESCEIN SODIUM 1 MG OP STRP
ORAL_STRIP | OPHTHALMIC | Status: AC
  Filled 2018-04-18: qty 1

## 2018-04-18 NOTE — ED Triage Notes (Signed)
C/o draining and pain in left eye this am.

## 2018-04-18 NOTE — Discharge Instructions (Signed)
Use ofloxacin eyedrops as directed on left eye. Artificial tear gel at night. Wait 10-15 minutes between drops, always use artificial tear gel last, as it prevents drops from penetrating through. Follow up with ophthalmology in 2 days for recheck and monitoring. Monitor for any worsening of symptoms, changes in vision, sensitivity to light, eye swelling, painful eye movement, follow up with ophthalmology for further evaluation.
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
## 2018-04-19 DIAGNOSIS — M545 Low back pain: Secondary | ICD-10-CM | POA: Diagnosis not present

## 2018-04-29 ENCOUNTER — Ambulatory Visit (INDEPENDENT_AMBULATORY_CARE_PROVIDER_SITE_OTHER): Payer: Medicare Other | Admitting: Licensed Clinical Social Worker

## 2018-04-29 ENCOUNTER — Encounter (HOSPITAL_COMMUNITY): Payer: Self-pay | Admitting: Licensed Clinical Social Worker

## 2018-04-29 DIAGNOSIS — F3162 Bipolar disorder, current episode mixed, moderate: Secondary | ICD-10-CM | POA: Diagnosis not present

## 2018-04-29 NOTE — Progress Notes (Signed)
THERAPIST PROGRESS NOTE  Session Time: 9:05am-10:00am  Participation Level: Active  Behavioral Response: Well GroomedAlertDepressed  Type of Therapy: Individual Therapy  Treatment Goals addressed: improve psychiatric symptoms, controlled behavior, moderate mood, and deliberate speech (improved social and work functioning and decrease impulsive behavior), improve unhelpful thought patterns, elevate mood (increased hopefulness and social interactions), interpersonal relationship skills, discuss and process trauma, learn about diagnosis, healthy coping skills  Interventions: Motivational Interviewing, CBT, Grounding and Mindfulness Techniques   Summary: Jordan [name] is a 37 y.o. female who presents with Moderate mixed bipolar I disorder   Suicidal/Homicidal: No -without intent/plan  Therapist Response:  Jordan [name] met with clinician for an individual session. Jordan [name] discussed her psychiatric symptoms, her current life events and her homework. Jordan [name] shared a great deal of concern about her niece, who is living in an unsafe housing situation. Clinician processed these concerns and provided options regarding DSS involvement, or just having sister write a letter to be notarized for consent to make medical decisions. Clinician encouraged Jordan [name] to make these moves in order to protect her niece, as the family is involved with substance abuse and unstable housing. Clinician assisted Jordan [name] in processing concerns about her house/water situation, as well as updates on her relationship.   Plan: Return again in 2 weeks.  Diagnosis: Axis I: Moderate mixed bipolar I disorder    R , LCSW 04/29/2018  

## 2018-05-02 ENCOUNTER — Other Ambulatory Visit (HOSPITAL_COMMUNITY): Payer: Self-pay | Admitting: Psychiatry

## 2018-05-02 DIAGNOSIS — F3132 Bipolar disorder, current episode depressed, moderate: Secondary | ICD-10-CM

## 2018-05-02 DIAGNOSIS — F4310 Post-traumatic stress disorder, unspecified: Secondary | ICD-10-CM

## 2018-05-07 ENCOUNTER — Telehealth: Payer: Self-pay | Admitting: *Deleted

## 2018-05-07 ENCOUNTER — Other Ambulatory Visit: Payer: Self-pay | Admitting: Internal Medicine

## 2018-05-07 MED ORDER — FLUCONAZOLE 150 MG PO TABS: 150.0000 mg | ORAL_TABLET | Freq: Every day | ORAL | 0 refills | Status: DC

## 2018-05-07 NOTE — Telephone Encounter (Signed)
Sent prescription for Diflucan to patient's pharmacy. If symptoms do not improve, will need to be seen in [facility] for vaginal swab.

## 2018-05-07 NOTE — Telephone Encounter (Signed)
White discharge and vaginal itching x 4 days (has been treated for yeast infections in the past and symptoms are the same). Brought something otc today-but prefers to take a pill.  Will send to pcp to see if diflucan rx can be sent to pharmacy.  Please advise.Despina Hidden Cassady3/18/20201:59 PM

## 2018-05-13 ENCOUNTER — Other Ambulatory Visit: Payer: Self-pay

## 2018-05-13 ENCOUNTER — Ambulatory Visit (INDEPENDENT_AMBULATORY_CARE_PROVIDER_SITE_OTHER): Payer: Medicare Other | Admitting: Licensed Clinical Social Worker

## 2018-05-13 ENCOUNTER — Encounter (HOSPITAL_COMMUNITY): Payer: Self-pay | Admitting: Licensed Clinical Social Worker

## 2018-05-13 DIAGNOSIS — F3162 Bipolar disorder, current episode mixed, moderate: Secondary | ICD-10-CM

## 2018-05-13 NOTE — Progress Notes (Signed)
   THERAPIST PROGRESS NOTE  Session Time: 9:10am-10:00am  Participation Level: Active  Behavioral Response: Well GroomedAlertEuthymic  Type of Therapy: Individual Therapy  Treatment Goals addressed: improve psychiatric symptoms, controlled behavior, moderate mood, and deliberate speech (improved social and work functioning and decrease impulsive behavior), improve unhelpful thought patterns, elevate mood (increased hopefulness and social interactions), interpersonal relationship skills, discuss and process trauma, learn about diagnosis, healthy coping skills  Interventions: Motivational Interviewing, CBT, Grounding and Mindfulness Techniques   Summary: Jordan [name] is a 38 y.o. female who presents with Moderate mixed bipolar I disorder   Suicidal/Homicidal: No -without intent/plan  Therapist Response:  [name] met with clinician for an individual session. [name] discussed her psychiatric symptoms, her current life events and her homework. [name] shared that she has been doing well overall. However, many changes have occurred. She reports her 57 year old niece has moved in, but her mother has not yet signed over guardianship. [name] discussed concerns about raising this child due to her not "knowing anything". [name] reports her niece does not bathe regularly, does not have appropriate clothing, has poor ADLs, etc. Clinician offered supportive counseling and parenting guidance, particularly with establishing a daily routine. Clinician provided psychoeducation about Maslow's Hierarchy of needs and identified that her niece is on the very bottom level, requiring basic needs to be met.  Clinician explored mood and interactions with family and new girlfriend. [name] reports she is doing well with her new girlfriend she travelled to [facility] to visit last week. She appears happy and well connected. [name] continues to struggle with her family, but reports that if she can get guardianship of her niece, a  lot of pressure will be off her.   Plan: Return again in 2 week.  Diagnosis: Axis I: Moderate mixed bipolar I disorder    [name], LCSW 05/13/2018

## 2018-05-27 ENCOUNTER — Encounter (HOSPITAL_COMMUNITY): Payer: Self-pay | Admitting: Licensed Clinical Social Worker

## 2018-05-27 ENCOUNTER — Other Ambulatory Visit: Payer: Self-pay

## 2018-05-27 ENCOUNTER — Ambulatory Visit (INDEPENDENT_AMBULATORY_CARE_PROVIDER_SITE_OTHER): Payer: Medicare Other | Admitting: Licensed Clinical Social Worker

## 2018-05-27 DIAGNOSIS — F3162 Bipolar disorder, current episode mixed, moderate: Secondary | ICD-10-CM

## 2018-05-27 NOTE — Progress Notes (Signed)
Virtual Visit via Video Note  I connected with ROSETTA RUPNOW on 05/27/18 at  9:00 AM EDT by a video enabled telemedicine application and verified that I am speaking with the correct person using two identifiers.   I discussed the limitations of evaluation and management by telemedicine and the availability of in person appointments. The patient expressed understanding and agreed to proceed.  Type of Therapy: Individual Therapy   Treatment Goals addressed: improve psychiatric symptoms, controlled behavior, moderate mood, and deliberate speech (improved social and work functioning and decrease impulsive behavior), improve unhelpful thought patterns, elevate mood (increased hopefulness and social interactions), interpersonal relationship skills, discuss and process trauma, learn about diagnosis, healthy coping skills   Interventions: Motivational Interviewing, CBT, Grounding and Mindfulness Techniques   Summary: AMRA SHUKLA is a 38 y.o. female who presents with Moderate mixed bipolar I disorder   Suicidal/Homicidal: No -without intent/plan   Therapist Response:  Adalyna met with clinician for an individual session. Karrina discussed her psychiatric symptoms, her current life events and her homework. Livier shared that she has been doing pretty well. She identified some concerns about her niece, who now lives with her. Clinician provided parenting guidance, as well as normalized the experience of raising a 39 year old girl. Clinician discussed the importance of consistency, safety, and comfort. Tina identified that niece is feeling comfortable and that they visited [place] girlfriend in [place] last week, which was a very positive experience. Clinician processed the visit and noted the change in Madylyn's affect and mood since this relationship started. Clinician processed the importance of distance in this relationship and noted how this has allowed them to get to know each other, rather than rush  due to necessity or loneliness.  Joelle processed her relationship with mom and sister, noting that sister has not reached out to [name] or her niece in weeks. Clinician encouraged Magie to get medical consent for niece in order to enroll her with primary care and therapy.   Plan: Return again in 2 weeks.   Diagnosis: Axis I: Moderate mixed bipolar I disorder   I discussed the assessment and treatment plan with the patient. The patient was provided an opportunity to ask questions and all were answered. The patient agreed with the plan and demonstrated an understanding of the instructions.   The patient was advised to call back or seek an in-person evaluation if the symptoms worsen or if the condition fails to improve as anticipated.  I provided 45 minutes of non-face-to-face time during this encounter.   [name], LCSW

## 2018-06-02 ENCOUNTER — Ambulatory Visit (INDEPENDENT_AMBULATORY_CARE_PROVIDER_SITE_OTHER): Payer: Medicare Other | Admitting: Licensed Clinical Social Worker

## 2018-06-02 ENCOUNTER — Encounter (HOSPITAL_COMMUNITY): Payer: Self-pay | Admitting: Licensed Clinical Social Worker

## 2018-06-02 ENCOUNTER — Other Ambulatory Visit: Payer: Self-pay

## 2018-06-02 DIAGNOSIS — F3162 Bipolar disorder, current episode mixed, moderate: Secondary | ICD-10-CM | POA: Diagnosis not present

## 2018-06-02 NOTE — Progress Notes (Signed)
Virtual Visit via Video Note  I connected with Jordan [name] on 06/02/18 at  2:30 PM EDT by a video enabled telemedicine application and verified that I am speaking with the correct person using two identifiers.   I discussed the limitations of evaluation and management by telemedicine and the availability of in person appointments. The patient expressed understanding and agreed to proceed.  Type of Therapy: Individual Therapy   Treatment Goals addressed: improve psychiatric symptoms, controlled behavior, moderate mood, and deliberate speech (improved social and work functioning and decrease impulsive behavior), improve unhelpful thought patterns, elevate mood (increased hopefulness and social interactions), interpersonal relationship skills, discuss and process trauma, learn about diagnosis, healthy coping skills   Interventions: Motivational Interviewing, CBT, Grounding and Mindfulness Techniques   Summary: Jordan [name] is a 38 y.o. female who presents with Moderate mixed bipolar I disorder   Suicidal/Homicidal: No -without intent/plan   Therapist Response:  Jordan [name] met with clinician for an individual session. Jordan [name] discussed her psychiatric symptoms, her current life events and her homework. Jordan [name] shared that she is doing a little better today than she did last week. She reported having a break down due to stress of niece not cleaning up or helping around the house and her dog chewed through a roll of toilet paper, which sent her over the edge. Jordan [name] also reported that she was in a lot of pain, which contributed to her short temper. Clinician provided feedback about the importance of communication with niece, not only about expectations, but also about Jordan [name]'s health/pain/mood issues. Clinician identified the importance of being able to be open with niece, as she will likely be there for a long time and this will help them get along and be happier. Clinician provided more developmental  guidance and noted that although niece is in Honors classes, she is not as mature as a typical 39 year old and this is due to upbringing. Clinician urged Jordan [name] to problem solve and to help niece in the way she would do for a 27 or 38 year old.   Plan: Return again in 1 week.   Diagnosis: Axis I: Moderate mixed bipolar I disorder  I discussed the assessment and treatment plan with the patient. The patient was provided an opportunity to ask questions and all were answered. The patient agreed with the plan and demonstrated an understanding of the instructions.   The patient was advised to call back or seek an in-person evaluation if the symptoms worsen or if the condition fails to improve as anticipated.  I provided 45 minutes of non-face-to-face time during this encounter.   [name], LCSW

## 2018-06-10 ENCOUNTER — Ambulatory Visit (INDEPENDENT_AMBULATORY_CARE_PROVIDER_SITE_OTHER): Payer: Medicare Other | Admitting: Licensed Clinical Social Worker

## 2018-06-10 ENCOUNTER — Other Ambulatory Visit: Payer: Self-pay

## 2018-06-10 ENCOUNTER — Encounter (HOSPITAL_COMMUNITY): Payer: Self-pay | Admitting: Licensed Clinical Social Worker

## 2018-06-10 DIAGNOSIS — F3162 Bipolar disorder, current episode mixed, moderate: Secondary | ICD-10-CM | POA: Diagnosis not present

## 2018-06-10 NOTE — Progress Notes (Signed)
Virtual Visit via Video Note  I connected with Jordan [name] on 06/10/18 at  9:00 AM EDT by a video enabled telemedicine application and verified that I am speaking with the correct person using two identifiers.   I discussed the limitations of evaluation and management by telemedicine and the availability of in person appointments. The patient expressed understanding and agreed to proceed.   Type of Therapy: Individual Therapy   Treatment Goals addressed: improve psychiatric symptoms, controlled behavior, moderate mood, and deliberate speech (improved social and work functioning and decrease impulsive behavior), improve unhelpful thought patterns, elevate mood (increased hopefulness and social interactions), interpersonal relationship skills, discuss and process trauma, learn about diagnosis, healthy coping skills   Interventions: Motivational Interviewing, CBT, Grounding and Mindfulness Techniques   Summary: Jordan [name] is a 38 y.o. female who presents with Moderate mixed bipolar I disorder   Suicidal/Homicidal: No -without intent/plan   Therapist Response:  Terry met with clinician for an individual session. Jordan [name] discussed her psychiatric symptoms, her current life events and her homework. Jordan [name] shared that she has been having a rough time lately dealing with her 67 year old niece. She reports she did not know it would be this hard. Clinician provided supportive feedback about parenting a teenager. Clinician also reminded Jordan [name] that she is starting over in a lot of ways due to her niece's upbringing, or lack thereof. Clinician provided support and praise about the significant changes that have been noticeable in niece since she has come to live with Jordan [name]. Clinician also identified the importance and usefulness of other family members, who can help out. Clinician encouraged [name] to reach out to her support system for childcare in order to have a break.  Jordan [name] discussed her  relationship and noted that her girlfriend and son are visiting for the next couple of weeks. Clinician processed this and noted mainly positive interactions. However, Jordan [name] reported that her anxiety is up due to having to be a strict parent for niece.  Clinician encouraged Jordan [name] to use her coping skills in dealing with her anxiety and also to discuss this with Dr. Adele Schilder in case a medication adjustment is in order.    Plan: Return again in 2 week.   Diagnosis: Axis I: Moderate mixed bipolar I disorde   I discussed the assessment and treatment plan with the patient. The patient was provided an opportunity to ask questions and all were answered. The patient agreed with the plan and demonstrated an understanding of the instructions.   The patient was advised to call back or seek an in-person evaluation if the symptoms worsen or if the condition fails to improve as anticipated.  I provided 45 minutes of non-face-to-face time during this encounter.   [name], LCSW

## 2018-06-12 ENCOUNTER — Encounter (HOSPITAL_COMMUNITY): Payer: Self-pay | Admitting: Psychiatry

## 2018-06-12 ENCOUNTER — Ambulatory Visit (INDEPENDENT_AMBULATORY_CARE_PROVIDER_SITE_OTHER): Payer: Medicare Other | Admitting: Psychiatry

## 2018-06-12 ENCOUNTER — Other Ambulatory Visit: Payer: Self-pay

## 2018-06-12 DIAGNOSIS — F3132 Bipolar disorder, current episode depressed, moderate: Secondary | ICD-10-CM | POA: Diagnosis not present

## 2018-06-12 DIAGNOSIS — F419 Anxiety disorder, unspecified: Secondary | ICD-10-CM

## 2018-06-12 DIAGNOSIS — F4310 Post-traumatic stress disorder, unspecified: Secondary | ICD-10-CM | POA: Diagnosis not present

## 2018-06-12 MED ORDER — DIVALPROEX SODIUM ER 250 MG PO TB24: 750.0000 mg | ORAL_TABLET | Freq: Every day | ORAL | 1 refills | Status: DC

## 2018-06-12 MED ORDER — HYDROXYZINE HCL 10 MG PO TABS: ORAL_TABLET | ORAL | 1 refills | Status: DC
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
## 2018-06-13 ENCOUNTER — Other Ambulatory Visit: Payer: Self-pay

## 2018-06-13 DIAGNOSIS — M797 Fibromyalgia: Secondary | ICD-10-CM

## 2018-06-13 MED ORDER — PREGABALIN 150 MG PO CAPS: 150.0000 mg | ORAL_CAPSULE | Freq: Two times a day (BID) | ORAL | 2 refills | Status: DC

## 2018-06-13 NOTE — Telephone Encounter (Signed)
pregabalin (LYRICA) 150 MG capsule, refill request @  [id number] [address], [place]. [phone] (Phone) [phone] (Fax)

## 2018-06-24 ENCOUNTER — Ambulatory Visit (INDEPENDENT_AMBULATORY_CARE_PROVIDER_SITE_OTHER): Payer: Medicare Other | Admitting: Licensed Clinical Social Worker

## 2018-06-24 ENCOUNTER — Other Ambulatory Visit: Payer: Self-pay

## 2018-06-24 ENCOUNTER — Encounter (HOSPITAL_COMMUNITY): Payer: Self-pay | Admitting: Licensed Clinical Social Worker

## 2018-06-24 DIAGNOSIS — F3162 Bipolar disorder, current episode mixed, moderate: Secondary | ICD-10-CM | POA: Diagnosis not present

## 2018-06-24 NOTE — Progress Notes (Signed)
Virtual Visit via Video Note  I connected with Jordan [name] on 06/24/18 at  9:00 AM EDT by a video enabled telemedicine application and verified that I am speaking with the correct person using two identifiers.  Location: Patient: 95  Provider: 95   I discussed the limitations of evaluation and management by telemedicine and the availability of in person appointments. The patient expressed understanding and agreed to proceed. Type of Therapy: Individual Therapy   Treatment Goals addressed: improve psychiatric symptoms, controlled behavior, moderate mood, and deliberate speech (improved social and work functioning and decrease impulsive behavior), improve unhelpful thought patterns, elevate mood (increased hopefulness and social interactions), interpersonal relationship skills, discuss and process trauma, learn about diagnosis, healthy coping skills   Interventions: Motivational Interviewing, CBT, Grounding and Mindfulness Techniques   Summary: Jordan [name] is a 38 y.o. female who presents with Moderate mixed bipolar I disorder   Suicidal/Homicidal: No -without intent/plan   Therapist Response:  Jordan [name] met with clinician for an individual session. Jordan [name] discussed her psychiatric symptoms, her current life events and her homework. Jordan [name] shared that she has been doing okay, but a lot had been happening. She reports her niece decided to go home. Clinician explored thoughts and feelings, noting happiness that niece was gone, but frustration with how it went down. Clinician processed Jordan [name]'s frustration, but noted that the relationship is fine and they had seen each other almost daily after niece moved out anyway. Jordan [name] reports her girlfriend will be going back to [place] on Friday, which will give Jordan [name] time to regroup, get herself organized, and find a job, get healthy, and start exercising again. Clinician processed an email sent last week with concerns about Jordan [name]'s ability to trust  others. Clinician reviewed CBT concept of core beliefs and reviewed past workbook CBT.  Clinician identified the tendency for Jordan [name] to push people away or to respond inappropriately when they show their "soft spots". Clinician encouraged Jordan [name] to allow herself to be open in order to be fully present in her relationships. Clinician also noted the challenge of the primary relationship (with mom) being based on service, rather than closeness.   Plan: Return again in 2 week.   Diagnosis: Axis I: Moderate mixed bipolar I disorder     I discussed the assessment and treatment plan with the patient. The patient was provided an opportunity to ask questions and all were answered. The patient agreed with the plan and demonstrated an understanding of the instructions.   The patient was advised to call back or seek an in-person evaluation if the symptoms worsen or if the condition fails to improve as anticipated.  I provided 45 minutes of non-face-to-face time during this encounter.   Jordan Curling, LCSW

## 2018-06-27 DIAGNOSIS — H04123 Dry eye syndrome of bilateral lacrimal glands: Secondary | ICD-10-CM | POA: Diagnosis not present

## 2018-07-04 ENCOUNTER — Ambulatory Visit (INDEPENDENT_AMBULATORY_CARE_PROVIDER_SITE_OTHER): Payer: Medicare Other | Admitting: Internal Medicine

## 2018-07-04 ENCOUNTER — Other Ambulatory Visit: Payer: Self-pay

## 2018-07-04 DIAGNOSIS — M797 Fibromyalgia: Secondary | ICD-10-CM

## 2018-07-05 ENCOUNTER — Other Ambulatory Visit (HOSPITAL_COMMUNITY): Payer: Self-pay | Admitting: Psychiatry

## 2018-07-05 DIAGNOSIS — F3132 Bipolar disorder, current episode depressed, moderate: Secondary | ICD-10-CM

## 2018-07-05 DIAGNOSIS — F4310 Post-traumatic stress disorder, unspecified: Secondary | ICD-10-CM

## 2018-07-07 ENCOUNTER — Encounter: Payer: Self-pay | Admitting: Internal Medicine

## 2018-07-07 NOTE — Progress Notes (Signed)
Internal Medicine Clinic Attending  Case discussed with Dr. Isac Sarna soon after the resident saw the patient.  We reviewed the resident's history, telephone conversation and pertinent patient test results.  I agree with the assessment, diagnosis, and plan of care documented in the resident's note.
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
## 2018-07-07 NOTE — Assessment & Plan Note (Signed)
Jordan [name] is doing well and does not endorse any complaints.  She is on Lyrica 150 mg twice daily for fibromyalgia which has been controlling her symptoms very well.  She has gained 20 pounds during quarantine but is working on exercising and focusing on staying active throughout the day.  Does not need refills at this time.  Advised her to call clinic if any issues come up.

## 2018-07-08 ENCOUNTER — Other Ambulatory Visit: Payer: Self-pay

## 2018-07-08 ENCOUNTER — Ambulatory Visit (HOSPITAL_COMMUNITY): Payer: Medicare Other | Admitting: Licensed Clinical Social Worker

## 2018-07-22 ENCOUNTER — Ambulatory Visit (INDEPENDENT_AMBULATORY_CARE_PROVIDER_SITE_OTHER): Payer: Medicare Other | Admitting: Licensed Clinical Social Worker

## 2018-07-22 ENCOUNTER — Other Ambulatory Visit: Payer: Self-pay

## 2018-07-22 ENCOUNTER — Encounter (HOSPITAL_COMMUNITY): Payer: Self-pay | Admitting: Licensed Clinical Social Worker

## 2018-07-22 DIAGNOSIS — F3162 Bipolar disorder, current episode mixed, moderate: Secondary | ICD-10-CM

## 2018-07-22 NOTE — Progress Notes (Signed)
Virtual Visit via Video Note  I connected with Jordan [name] on 07/22/18 at 10:00 AM EDT by a video enabled telemedicine application and verified that I am speaking with the correct person using two identifiers.    I discussed the limitations of evaluation and management by telemedicine and the availability of in person appointments. The patient expressed understanding and agreed to proceed.    Type of Therapy: Individual Therapy   Treatment Goals addressed: improve psychiatric symptoms, controlled behavior, moderate mood, and deliberate speech (improved social and work functioning and decrease impulsive behavior), improve unhelpful thought patterns, elevate mood (increased hopefulness and social interactions), interpersonal relationship skills, discuss and process trauma, learn about diagnosis, healthy coping skills   Interventions: Motivational Interviewing, CBT, Grounding and Mindfulness Techniques   Summary: Jordan [name] is a 38 y.o. female who presents with Moderate mixed bipolar I disorder   Suicidal/Homicidal: No -without intent/plan   Therapist Response:  Jordan [name] met with clinician for an individual session. Jordan [name] discussed her psychiatric symptoms, her current life events and her homework. Jordan [name] shared that her girlfriend and son just left yesterday to go back home to [place]. She reports feeling a little sad, but also enjoying some peaceful moments. Clinician processed the relationships and noted differences in this relationship than in others, namely that this woman is self-sufficient and independent. Jordan [name] agreed that this was different and noted the differences in her, as she does not have to be the "caregiver". Clinician furthered this and noted the importance of having some independence in a relationship in order to have a more equal partnership. Jordan [name] discussed concerns about her mom and noted ongoing problems with mom's substance abuse and poor health. Clinician processed  thoughts and feelings using MI OARS.   Plan: Return again in 2 weeks.   Diagnosis: Axis I: Moderate mixed bipolar I disorder I discussed the assessment and treatment plan with the patient. The patient was provided an opportunity to ask questions and all were answered. The patient agreed with the plan and demonstrated an understanding of the instructions.   The patient was advised to call back or seek an in-person evaluation if the symptoms worsen or if the condition fails to improve as anticipated.  I provided 45 minutes of non-face-to-face time during this encounter.   [name], LCSW

## 2018-07-24 ENCOUNTER — Encounter (HOSPITAL_COMMUNITY): Payer: Self-pay | Admitting: Psychiatry

## 2018-07-24 ENCOUNTER — Ambulatory Visit (INDEPENDENT_AMBULATORY_CARE_PROVIDER_SITE_OTHER): Payer: Medicare Other | Admitting: Psychiatry

## 2018-07-24 ENCOUNTER — Other Ambulatory Visit: Payer: Self-pay

## 2018-07-24 DIAGNOSIS — F3132 Bipolar disorder, current episode depressed, moderate: Secondary | ICD-10-CM | POA: Diagnosis not present

## 2018-07-24 DIAGNOSIS — F419 Anxiety disorder, unspecified: Secondary | ICD-10-CM | POA: Diagnosis not present

## 2018-07-24 DIAGNOSIS — F4310 Post-traumatic stress disorder, unspecified: Secondary | ICD-10-CM

## 2018-07-24 MED ORDER — HYDROXYZINE HCL 10 MG PO TABS: ORAL_TABLET | ORAL | 0 refills | Status: DC

## 2018-07-24 MED ORDER — DIVALPROEX SODIUM ER 250 MG PO TB24: 750.0000 mg | ORAL_TABLET | Freq: Every day | ORAL | 2 refills | Status: DC
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
## 2018-07-31 ENCOUNTER — Ambulatory Visit (HOSPITAL_COMMUNITY): Payer: Medicare Other

## 2018-07-31 ENCOUNTER — Other Ambulatory Visit: Payer: Self-pay

## 2018-07-31 DIAGNOSIS — Z79899 Other long term (current) drug therapy: Secondary | ICD-10-CM

## 2018-07-31 NOTE — Progress Notes (Signed)
Patient came in for labs today, Depakote and CMP

## 2018-08-01 LAB — SPECIMEN STATUS

## 2018-08-01 LAB — COMPREHENSIVE METABOLIC PANEL
ALT: 11 IU/L (ref 0–32)
AST: 20 IU/L (ref 0–40)
Albumin/Globulin Ratio: 1.7 (ref 1.2–2.2)
Albumin: 3.9 g/dL (ref 3.8–4.8)
Alkaline Phosphatase: 33 IU/L — ABNORMAL LOW (ref 39–117)
BUN/Creatinine Ratio: 10 (ref 9–23)
BUN: 11 mg/dL (ref 6–20)
Bilirubin Total: 0.2 mg/dL (ref 0.0–1.2)
CO2: 23 mmol/L (ref 20–29)
Calcium: 9.3 mg/dL (ref 8.7–10.2)
Chloride: 103 mmol/L (ref 96–106)
Creatinine, Ser: 1.07 mg/dL — ABNORMAL HIGH (ref 0.57–1.00)
GFR calc Af Amer: 76 mL/min/{1.73_m2} (ref >59)
GFR calc non Af Amer: 66 mL/min/{1.73_m2} (ref >59)
Globulin, Total: 2.3 g/dL (ref 1.5–4.5)
Glucose: 66 mg/dL (ref 65–99)
Potassium: 4.5 mmol/L (ref 3.5–5.2)
Sodium: 139 mmol/L (ref 134–144)
Total Protein: 6.2 g/dL (ref 6.0–8.5)

## 2018-08-01 LAB — SPECIMEN STATUS REPORT

## 2018-08-01 LAB — VALPROIC ACID LEVEL

## 2018-08-12 ENCOUNTER — Encounter (HOSPITAL_COMMUNITY): Payer: Self-pay | Admitting: Licensed Clinical Social Worker

## 2018-08-12 ENCOUNTER — Other Ambulatory Visit: Payer: Self-pay

## 2018-08-12 ENCOUNTER — Ambulatory Visit (INDEPENDENT_AMBULATORY_CARE_PROVIDER_SITE_OTHER): Payer: Medicare Other | Admitting: Licensed Clinical Social Worker

## 2018-08-12 DIAGNOSIS — F3161 Bipolar disorder, current episode mixed, mild: Secondary | ICD-10-CM

## 2018-08-12 NOTE — Progress Notes (Signed)
Virtual Visit via Video Note  I connected with Jordan [name] on 08/12/18 at  4:30 PM EDT by a video enabled telemedicine application and verified that I am speaking with the correct person using two identifiers.     I discussed the limitations of evaluation and management by telemedicine and the availability of in person appointments. The patient expressed understanding and agreed to proceed.   Type of Therapy: Individual Therapy   Treatment Goals addressed: improve psychiatric symptoms, controlled behavior, moderate mood, and deliberate speech (improved social and work functioning and decrease impulsive behavior), improve unhelpful thought patterns, elevate mood (increased hopefulness and social interactions), interpersonal relationship skills, discuss and process trauma, learn about diagnosis, healthy coping skills   Interventions: Motivational Interviewing, CBT, Grounding and Mindfulness Techniques   Summary: Jordan [name] is a 38 y.o. female who presents with Mild mixed bipolar I disorder   Suicidal/Homicidal: No -without intent/plan   Therapist Response:  Jordan [name] met with clinician for an individual session. Jordan [name] discussed her psychiatric symptoms, her current life events and her homework. Jordan [name] shared that she has been doing "awesome". She reports she has started a new job that is about 15 minutes from her home. She likes the work and likes the people. Jordan [name] also reports that she is in a healthy and happy relationship, the only downside being that her girlfriend lives in [place]. Clinician processed these positive updates in her life and explored ways to maintain this good vibe. Jordan [name] reports she has some concern that things will level out and maybe get worse. Clinician discussed the importance of being present and mindful in the moment. Clinician led Jordan [name] through a mindfulness exercise and processed what is good about this moment. Clinician also noted that mood is stable and coping  skills with stress and pain have been positive as well. Jordan [name] reports she is in the Ticket to Work program through her disability, which provides her the chance to work full time and if something happens in the next 5 years where she would need to go back on disability, she would be able to get it with no problem. Jordan [name] discussed concerns about insurance benefits. Clinician provided feedback about contacting disability or Ticket to work people about insurance. Clinician noted that if this is not possible, she can apply for [facility].    Plan: Return again in 2 weeks.   Diagnosis: Axis I: Mild mixed bipolar I disorder     I discussed the assessment and treatment plan with the patient. The patient was provided an opportunity to ask questions and all were answered. The patient agreed with the plan and demonstrated an understanding of the instructions.   The patient was advised to call back or seek an in-person evaluation if the symptoms worsen or if the condition fails to improve as anticipated.  I provided 45 minutes of non-face-to-face time during this encounter.   [name], LCSW

## 2018-08-19 ENCOUNTER — Ambulatory Visit (HOSPITAL_COMMUNITY): Payer: Medicare Other | Admitting: Licensed Clinical Social Worker

## 2018-08-28 ENCOUNTER — Other Ambulatory Visit: Payer: Self-pay

## 2018-08-28 ENCOUNTER — Ambulatory Visit (INDEPENDENT_AMBULATORY_CARE_PROVIDER_SITE_OTHER): Payer: Medicare Other | Admitting: Licensed Clinical Social Worker

## 2018-08-28 DIAGNOSIS — F3161 Bipolar disorder, current episode mixed, mild: Secondary | ICD-10-CM | POA: Diagnosis not present

## 2018-08-28 NOTE — Progress Notes (Signed)
Virtual Visit via Video Note  I connected with Jordan [name] on 08/28/18 at  4:30 PM EDT by a video enabled telemedicine application and verified that I am speaking with the correct person using two identifiers.     I discussed the limitations of evaluation and management by telemedicine and the availability of in person appointments. The patient expressed understanding and agreed to proceed.  Type of Therapy: Individual Therapy  Treatment Goals addressed: improve psychiatric symptoms, controlled behavior, moderate mood, and deliberate speech (improved social and work functioning and decrease impulsive behavior), improve unhelpful thought patterns, elevate mood (increased hopefulness and social interactions), interpersonal relationship skills, discuss and process trauma, learn about diagnosis, healthy coping skills  Interventions: Motivational Interviewing, CBT, Grounding and Mindfulness Techniques  Summary: Jordan [name] is a 38 y.o. female who presents with Moderate mixed bipolar I disorder  Suicidal/Homicidal: No -without intent/plan  Therapist Response:  Jordan [name] met with clinician for an individual session. Jordan [name] discussed her psychiatric symptoms, her current life events and her homework. Jordan [name] shared that she has been hurting a lot due to working so much, being on her feet for 8 hour shifts, and not taking breaks as she needs. Clinician discussed options for reducing her hours or getting accommodations. Jordan [name] reports she will follow up on this. Jordan [name] identified the challenges of being here without her girlfriend. Clinician processed the relationship and noted the positive improvements in her mental health and overall happiness in the relationship. Clinician discussed plans to move and noted they are still in process, but now that the job is uncertain, it may not be as easy to transfer.    Plan: Return again in 2 weeks.  Diagnosis: Axis I: Moderate mixed bipolar I disorder   I  discussed the assessment and treatment plan with the patient. The patient was provided an opportunity to ask questions and all were answered. The patient agreed with the plan and demonstrated an understanding of the instructions.   The patient was advised to call back or seek an in-person evaluation if the symptoms worsen or if the condition fails to improve as anticipated.  I provided 45 minutes of non-face-to-face time during this encounter.   [name], LCSW

## 2018-08-30 ENCOUNTER — Encounter (HOSPITAL_COMMUNITY): Payer: Self-pay | Admitting: Licensed Clinical Social Worker

## 2018-09-01 ENCOUNTER — Ambulatory Visit (INDEPENDENT_AMBULATORY_CARE_PROVIDER_SITE_OTHER): Payer: Medicare Other

## 2018-09-01 ENCOUNTER — Encounter: Payer: Self-pay | Admitting: Podiatry

## 2018-09-01 ENCOUNTER — Ambulatory Visit (INDEPENDENT_AMBULATORY_CARE_PROVIDER_SITE_OTHER): Payer: Medicare Other | Admitting: Podiatry

## 2018-09-01 ENCOUNTER — Other Ambulatory Visit: Payer: Self-pay

## 2018-09-01 VITALS — Temp 98.0°F

## 2018-09-01 DIAGNOSIS — M722 Plantar fascial fibromatosis: Secondary | ICD-10-CM | POA: Diagnosis not present

## 2018-09-01 MED ORDER — DICLOFENAC SODIUM 75 MG PO TBEC: 75.0000 mg | DELAYED_RELEASE_TABLET | Freq: Two times a day (BID) | ORAL | 2 refills | Status: DC

## 2018-09-01 NOTE — Progress Notes (Signed)
Subjective:   Patient ID: Jordan [name], female   DOB: 38 y.o.   MRN: [id number]   HPI Patient presents with exquisite discomfort plantar aspect heel region bilateral which have improved for a while but is bad now with patient having very flat feet which is part of her problem   ROS      Objective:  Physical Exam  Acute plantar fasciitis bilateral with flatfoot deformity is complicating factor     Assessment:  H&P conditions reviewed and today I went ahead and I did sterile prep and injected the plantar fascial bilateral 3 mg Kenalog 5 mg Xylocaine and noted that there is exquisite discomfort at the insertion of the tendon into the calcaneus     Plan:  I also reviewed condition and at this point I have recommended orthotics and patient is scanned for customized orthotic devices  X-rays indicate significant depression of the arch bilateral with multiple signs of arthritis

## 2018-09-09 ENCOUNTER — Other Ambulatory Visit: Payer: Self-pay

## 2018-09-09 ENCOUNTER — Encounter (HOSPITAL_COMMUNITY): Payer: Self-pay | Admitting: Licensed Clinical Social Worker

## 2018-09-09 ENCOUNTER — Ambulatory Visit (INDEPENDENT_AMBULATORY_CARE_PROVIDER_SITE_OTHER): Payer: Medicare Other | Admitting: Licensed Clinical Social Worker

## 2018-09-09 DIAGNOSIS — F3162 Bipolar disorder, current episode mixed, moderate: Secondary | ICD-10-CM | POA: Diagnosis not present
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
## 2018-09-15 ENCOUNTER — Other Ambulatory Visit: Payer: Self-pay | Admitting: Internal Medicine

## 2018-09-15 DIAGNOSIS — M797 Fibromyalgia: Secondary | ICD-10-CM

## 2018-09-15 MED ORDER — PREGABALIN 150 MG PO CAPS: 150.0000 mg | ORAL_CAPSULE | Freq: Two times a day (BID) | ORAL | 2 refills | Status: DC

## 2018-09-15 NOTE — Telephone Encounter (Signed)
Refill Request  pregabalin (LYRICA) 150 MG capsule   [id number] - [place], [place] - [address]

## 2018-09-16 ENCOUNTER — Other Ambulatory Visit: Payer: Self-pay | Admitting: *Deleted

## 2018-09-16 DIAGNOSIS — M797 Fibromyalgia: Secondary | ICD-10-CM

## 2018-09-16 NOTE — Telephone Encounter (Signed)
[facility]ax [facility] stating Dr Isac Sarna 's DEA # does not work. Please re-send rx

## 2018-09-17 MED ORDER — PREGABALIN 150 MG PO CAPS: 150.0000 mg | ORAL_CAPSULE | Freq: Two times a day (BID) | ORAL | 2 refills | Status: DC

## 2018-09-23 ENCOUNTER — Other Ambulatory Visit: Payer: Self-pay

## 2018-09-23 ENCOUNTER — Encounter (HOSPITAL_COMMUNITY): Payer: Self-pay | Admitting: Licensed Clinical Social Worker

## 2018-09-23 ENCOUNTER — Ambulatory Visit (INDEPENDENT_AMBULATORY_CARE_PROVIDER_SITE_OTHER): Payer: Medicare Other | Admitting: Licensed Clinical Social Worker

## 2018-09-23 DIAGNOSIS — F3162 Bipolar disorder, current episode mixed, moderate: Secondary | ICD-10-CM | POA: Diagnosis not present

## 2018-09-23 NOTE — Progress Notes (Signed)
Virtual Visit via Video Note  I connected with Jordan [name] on 09/23/18 at  4:30 PM EDT by a video enabled telemedicine application and verified that I am speaking with the correct person using two identifiers.     I discussed the limitations of evaluation and management by telemedicine and the availability of in person appointments. The patient expressed understanding and agreed to proceed.  Type of Therapy: Individual Therapy  Treatment Goals addressed: improve psychiatric symptoms, controlled behavior, moderate mood, and deliberate speech (improved social and work functioning and decrease impulsive behavior), improve unhelpful thought patterns, elevate mood (increased hopefulness and social interactions), interpersonal relationship skills, discuss and process trauma, learn about diagnosis, healthy coping skills  Interventions: Motivational Interviewing, CBT, Grounding and Mindfulness Techniques  Summary: Jordan [name] is a 38 y.o. female who presents with Moderate mixed bipolar I disorder  Suicidal/Homicidal: No -without intent/plan  Therapist Response:  Jordan [name] met with clinician for an individual session. Jordan [name] discussed her psychiatric symptoms, her current life events and her homework. Jordan [name] shared that she continues to experience a lot of pain from fibromyalgia. She has been tired and depressed due to the pain, and also somewhat depressed due to having to leave her job. Clinician reflected the enjoyment she had with the job and getting out, learning new things, and having an outlet for her energy. Jordan [name] identified that physical activity, exercise, sports have been great outlets for her in the past, but due to pain, she is unable to exercise how she wants. Clinician explored options for doing smaller exercises broken up over the day, such as a 1 mile walk twice a day, rather than a 2 mile walk all at once. Clinician also provided a resource for local hiking trails with varying  levels of difficulty and length. Clinician discussed other outlets for stress and noted art as another possibility. Jordan [name] reports that she likes to draw and started art when she was in anger management classes as a kid. She reports she never uses art when she is mad though, it is mostly to relax. Clinician encouraged Jordan [name] to look up painting or drawing classes on youtube and draw along with the artist.   Plan: Return again in 2 weeks.  Diagnosis: Axis I: Moderate mixed bipolar I disorder     I discussed the assessment and treatment plan with the patient. The patient was provided an opportunity to ask questions and all were answered. The patient agreed with the plan and demonstrated an understanding of the instructions.   The patient was advised to call back or seek an in-person evaluation if the symptoms worsen or if the condition fails to improve as anticipated.  I provided 50 minutes of non-face-to-face time during this encounter.   [name], LCSW

## 2018-09-30 ENCOUNTER — Other Ambulatory Visit: Payer: Self-pay

## 2018-09-30 ENCOUNTER — Ambulatory Visit: Payer: Medicare Other | Admitting: Orthotics

## 2018-09-30 DIAGNOSIS — M722 Plantar fascial fibromatosis: Secondary | ICD-10-CM

## 2018-09-30 NOTE — Progress Notes (Signed)
Patient came in today to pick up custom made foot orthotics.  The goals were accomplished and the patient reported no dissatisfaction with said orthotics.  Patient was advised of breakin period and how to report any issues. 

## 2018-10-07 ENCOUNTER — Ambulatory Visit (INDEPENDENT_AMBULATORY_CARE_PROVIDER_SITE_OTHER): Payer: Medicare Other | Admitting: Licensed Clinical Social Worker

## 2018-10-07 ENCOUNTER — Encounter (HOSPITAL_COMMUNITY): Payer: Self-pay | Admitting: Licensed Clinical Social Worker

## 2018-10-07 ENCOUNTER — Other Ambulatory Visit: Payer: Self-pay

## 2018-10-07 DIAGNOSIS — F3162 Bipolar disorder, current episode mixed, moderate: Secondary | ICD-10-CM | POA: Diagnosis not present

## 2018-10-07 NOTE — Progress Notes (Signed)
Virtual Visit via Video Note  I connected with Jordan [name] on 10/07/18 at  4:30 PM EDT by a video enabled telemedicine application and verified that I am speaking with the correct person using two identifiers.     I discussed the limitations of evaluation and management by telemedicine and the availability of in person appointments. The patient expressed understanding and agreed to proceed.   Type of Therapy: Individual Therapy  Treatment Goals addressed: improve psychiatric symptoms, controlled behavior, moderate mood, and deliberate speech (improved social and work functioning and decrease impulsive behavior), improve unhelpful thought patterns, elevate mood (increased hopefulness and social interactions), interpersonal relationship skills, discuss and process trauma, learn about diagnosis, healthy coping skills  Interventions: Motivational Interviewing, CBT, Grounding and Mindfulness Techniques  Summary: Jordan [name] is a 38 y.o. female who presents with Moderate mixed bipolar I disorder  Suicidal/Homicidal: No -without intent/plan  Therapist Response:  Jordan [name] met with clinician for an individual session. Jordan [name] discussed her psychiatric symptoms, her current life events and her homework. Jordan [name] shared some increased anxiety about her finances and worry about paying her bills. She also reports some bad dreams revolving around moving to [facility] with her girlfriend. Clinician explored the content and themes of the dreams and noted the importance of getting her finances under control before moving so she will not end up stranded in [place] if things don't work out with the girlfriend. Clinician explored family interactions and processed concerns about her niece. Jordan [name] discussed frustration with niece who does not clean up after herself and is not motivated to do her school work. Clinician explored options for either taking niece home or helping niece complete school work and get on schedule.    Plan: Return again in 2 week.  Diagnosis: Axis I: Moderate mixed bipolar I disorder I discussed the assessment and treatment plan with the patient. The patient was provided an opportunity to ask questions and all were answered. The patient agreed with the plan and demonstrated an understanding of the instructions.   The patient was advised to call back or seek an in-person evaluation if the symptoms worsen or if the condition fails to improve as anticipated.  I provided 45 minutes of non-face-to-face time during this encounter.   [name], LCSW

## 2018-10-16 ENCOUNTER — Other Ambulatory Visit (HOSPITAL_COMMUNITY): Payer: Self-pay | Admitting: Psychiatry

## 2018-10-16 DIAGNOSIS — F419 Anxiety disorder, unspecified: Secondary | ICD-10-CM

## 2018-10-16 DIAGNOSIS — F4310 Post-traumatic stress disorder, unspecified: Secondary | ICD-10-CM

## 2018-10-16 DIAGNOSIS — F3132 Bipolar disorder, current episode depressed, moderate: Secondary | ICD-10-CM

## 2018-10-20 ENCOUNTER — Ambulatory Visit (INDEPENDENT_AMBULATORY_CARE_PROVIDER_SITE_OTHER): Payer: Medicare Other | Admitting: Licensed Clinical Social Worker

## 2018-10-20 ENCOUNTER — Other Ambulatory Visit: Payer: Self-pay

## 2018-10-20 ENCOUNTER — Encounter (HOSPITAL_COMMUNITY): Payer: Self-pay | Admitting: Licensed Clinical Social Worker

## 2018-10-20 DIAGNOSIS — F3162 Bipolar disorder, current episode mixed, moderate: Secondary | ICD-10-CM | POA: Diagnosis not present

## 2018-10-20 NOTE — Progress Notes (Signed)
Virtual Visit via Video Note  I connected with Jordan [name] on 10/20/18 at  4:30 PM EDT by a video enabled telemedicine application and verified that I am speaking with the correct person using two identifiers.   I discussed the limitations of evaluation and management by telemedicine and the availability of in person appointments. The patient expressed understanding and agreed to proceed.  Type of Therapy: Individual Therapy  Treatment Goals addressed: improve psychiatric symptoms, controlled behavior, moderate mood, and deliberate speech (improved social and work functioning and decrease impulsive behavior), improve unhelpful thought patterns, elevate mood (increased hopefulness and social interactions), interpersonal relationship skills, discuss and process trauma, learn about diagnosis, healthy coping skills  Interventions: Motivational Interviewing, CBT, Grounding and Mindfulness Techniques  Summary: Jordan [name] is a 38 y.o. female who presents with Moderate mixed bipolar I disorder  Suicidal/Homicidal: No -without intent/plan  Therapist Response:  Jordan [name] met with clinician for an individual session. Jordan [name] discussed her psychiatric symptoms, her current life events and her homework. Jordan [name] shared ongoing frustration with living here, wanting to move to be closer to her girlfriend, and sadness that her family only contacts her when they need something. Clinician processed thoughts and feelings, provided feedback using MI OARS. Clinician provided some case management support, with assisting her in find the information for the local DSS/DHHS in [place] where she will be moving. Clinician also encouraged Jordan [name] to reach out to the supervisor of her housing SW, since she has had no help in over 6 months.   Plan: Return again in 2 week.  Diagnosis: Axis I: Moderate mixed bipolar I disorder   I discussed the assessment and treatment plan with the patient. The patient was provided an  opportunity to ask questions and all were answered. The patient agreed with the plan and demonstrated an understanding of the instructions.   The patient was advised to call back or seek an in-person evaluation if the symptoms worsen or if the condition fails to improve as anticipated.  I provided 45 minutes of non-face-to-face time during this encounter.   [name], LCSW

## 2018-10-21 ENCOUNTER — Other Ambulatory Visit: Payer: Self-pay

## 2018-10-21 ENCOUNTER — Ambulatory Visit (INDEPENDENT_AMBULATORY_CARE_PROVIDER_SITE_OTHER): Payer: Medicare Other | Admitting: Psychiatry

## 2018-10-21 ENCOUNTER — Encounter (HOSPITAL_COMMUNITY): Payer: Self-pay | Admitting: Psychiatry

## 2018-10-21 DIAGNOSIS — F4310 Post-traumatic stress disorder, unspecified: Secondary | ICD-10-CM | POA: Diagnosis not present

## 2018-10-21 DIAGNOSIS — F419 Anxiety disorder, unspecified: Secondary | ICD-10-CM

## 2018-10-21 DIAGNOSIS — F3132 Bipolar disorder, current episode depressed, moderate: Secondary | ICD-10-CM

## 2018-10-21 MED ORDER — HYDROXYZINE HCL 10 MG PO TABS: ORAL_TABLET | ORAL | 1 refills | Status: DC

## 2018-10-21 MED ORDER — DIVALPROEX SODIUM ER 250 MG PO TB24: 750.0000 mg | ORAL_TABLET | Freq: Every day | ORAL | 2 refills | Status: DC
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
## 2018-10-23 ENCOUNTER — Ambulatory Visit (HOSPITAL_COMMUNITY): Payer: Medicare Other | Admitting: Psychiatry

## 2018-11-03 ENCOUNTER — Encounter (HOSPITAL_COMMUNITY): Payer: Self-pay | Admitting: Licensed Clinical Social Worker

## 2018-11-03 ENCOUNTER — Ambulatory Visit (INDEPENDENT_AMBULATORY_CARE_PROVIDER_SITE_OTHER): Payer: Medicare Other | Admitting: Internal Medicine

## 2018-11-03 ENCOUNTER — Encounter: Payer: Self-pay | Admitting: Internal Medicine

## 2018-11-03 ENCOUNTER — Ambulatory Visit (INDEPENDENT_AMBULATORY_CARE_PROVIDER_SITE_OTHER): Payer: Medicare Other | Admitting: Licensed Clinical Social Worker

## 2018-11-03 ENCOUNTER — Other Ambulatory Visit: Payer: Self-pay

## 2018-11-03 VITALS — Ht 69.0 in | Wt 250.0 lb

## 2018-11-03 DIAGNOSIS — Z6836 Body mass index (BMI) 36.0-36.9, adult: Secondary | ICD-10-CM

## 2018-11-03 DIAGNOSIS — F3162 Bipolar disorder, current episode mixed, moderate: Secondary | ICD-10-CM | POA: Diagnosis not present

## 2018-11-03 DIAGNOSIS — Z0000 Encounter for general adult medical examination without abnormal findings: Secondary | ICD-10-CM | POA: Diagnosis not present

## 2018-11-03 DIAGNOSIS — E669 Obesity, unspecified: Secondary | ICD-10-CM | POA: Diagnosis not present

## 2018-11-03 NOTE — Progress Notes (Signed)
I discussed the AWV findings with the RN who conducted the visit. I was present in the office suite and immediately available to provide assistance and direction throughout the time the service was provided.   

## 2018-11-03 NOTE — Progress Notes (Signed)
This AWV is being conducted by [facility] only. The patient was located at in her car and I was located in [facility]. The patient's identity was confirmed using their DOB and current address. The patient or his/her legal guardian has consented to being evaluated through a telephone encounter and understands the associated risks (an examination cannot be done and the patient may need to come in for an appointment) / benefits (allows the patient to remain at home, decreasing exposure to coronavirus). I personally spent 38 minutes conducting the AWV.  Subjective:   Jordan [name] is a 38 y.o. female who presents for a Medicare Annual Wellness Visit.  The following items have been reviewed and updated today in the appropriate area in the EMR.   Health Risk Assessment  Height, weight, BMI, and BP Visual acuity if needed Depression screen Fall risk / safety level Advance directive discussion Medical and family history were reviewed and updated Updating list of other providers & suppliers Medication reconciliation, including over the counter medicines Cognitive screen Written screening schedule Risk Factor list Personalized health advice, risky behaviors, and treatment advice  Social History   Social History Narrative   Current Social History 11/03/2018        Patient lives alone in a townhome which is 2 stories. There are steps up to the entrance the patient uses.       Patient's method of transportation is personal car.      The highest level of education was some college.      The patient currently disabled.      Identified important Relationships are "My niece, my best friend"       Pets : 1 dog, yorkie named Tax inspector / Fun: "Play with my dog, Country park with my dog, board games, work-out (can't do since injury)"      Current Stressors: "Family and finances"       Religious / Personal Beliefs: "Christianity"       L. Ceniya Fowers, RN, BSN              Objective:    Vitals: Ht 5\' 9"  (1.753 m)   Wt 250 lb (113.4 kg)   BMI 36.92 kg/m  Vitals are unable to obtained due to [id number] public health emergency  Activities of Daily Living In your present state of health, do you have any difficulty performing the following activities: 11/03/2018 01/21/2018  Hearing? N N  Vision? N N  Difficulty concentrating or making decisions? Y N  Walking or climbing stairs? N N  Dressing or bathing? N N  Doing errands, shopping? N N  Some recent data might be hidden    Goals Goals    . Exercise 3x per week (30 min per time)     Walk 2 miles 3 x week    . Exercise 5-6x per week (30 min per day and increase as able to 60 minutes per day)    . Reduce calorie intake to 1800-1900 calories per day    . Reduce fat intake to 45-55 grams per day    . Reduce saturated fat to <15 grams and trans fat to < 2 grams respecitively       Fall Risk Fall Risk  11/03/2018 12/05/2017 10/02/2017 07/12/2017 04/05/2017  Falls in the past year? 1 Yes No Yes No  Number falls in past yr: 1 1 - 1 -  Comment 3 - - - -  Injury  with Fall? 0 No - No -  Comment - - - - -  Risk Factor Category  - - - - -  Risk for fall due to : History of fall(s);Impaired mobility History of fall(s);Impaired balance/gait - Other (Comment) -  Risk for fall due to: Comment Muscle spasm, legs give out - - foot/leg pain -  Follow up Education provided;Falls prevention discussed Falls prevention discussed - - -  CDC Handout on Fall Prevention and Handout on Home Exercise Program, Access codes [id number] and [id number] mailed to patient with red exercise band.    Depression Screen PHQ 2/9 Scores 11/03/2018 01/21/2018 12/20/2017 12/05/2017  PHQ - 2 Score 3 1 1 1   PHQ- 9 Score 16 5 8 12   Patient saw Psychiatrist 2 days ago and has appt with her counselor today at 4:30  Cognitive Testing Six-Item Cognitive Screener   "I would like to ask you some questions that ask you to use your memory. I am going to  name three objects. Please wait until I say all three words, then repeat them. Remember what they are  because I am going to ask you to name them again in a few minutes. Please repeat these words for me: APPLE-TABLE-PENNY." (Interviewer may repeat names 3 times if necessary but repetition not scored.)  Did patient correctly repeat all three words? Yes - may proceed with screen  What year is this? Correct What month is this? Correct What day of the week is this? Correct  What were the three objects I asked you to remember? . Apple Correct . Table Correct . Penny Correct  Score one point for each incorrect answer.  A score of 2 or more points warrants additional investigation.  Patient's score 0    Assessment and Plan:     Patient will call Nurse at Psychiatrist's office to schedule blood work (depakote level) Appt scheduled with Dr. Frederico Hamman on 12/01/2018 at 3:15 Patient will call for sooner appt if needed for pressure behind ears (patient thinks this is 2/2 tension) She will let us know if she receives flu vaccine elsewhere, otherwise she will get at upcoming appt with PCP She will begin seated and standing exercises with exercise band. She would like to meet with Butch Penny again for help with health food choices.  During the course of the visit the patient was educated and counseled about appropriate screening and preventive services as documented in the assessment and plan.  The printed AVS was given to the patient and included an updated screening schedule, a list of risk factors, and personalized health advice.        Velora Heckler, RN  11/03/2018

## 2018-11-03 NOTE — Progress Notes (Signed)
Internal Medicine Clinic Attending  Case discussed with Dr. Winfrey at the time of the visit.  We reviewed the AWV findings.  I agree with the assessment, diagnosis, and plan of care documented in the AWV note.     

## 2018-11-03 NOTE — Addendum Note (Signed)
Addended by: Velora Heckler on: 11/03/2018 03:56 PM   Modules accepted: Orders

## 2018-11-03 NOTE — Patient Instructions (Addendum)
Annual Wellness Visit   Medicare Covered Preventative Screenings and Services  Services & Screenings Men and Women Who How Often Need? Date of Last Service Action  Abdominal Aortic Aneurysm Adults with AAA risk factors Once     Alcohol Misuse and Counseling All Adults Screening once a year if no alcohol misuse. Counseling up to 4 face to face sessions.     Bone Density Measurement  Adults at risk for osteoporosis Once every 2 yrs     Lipid Panel Z13.6 All adults without CV disease Once every 5 yrs     Colorectal Cancer   Stool sample or  Colonoscopy All adults 74 and older   Once every year  Every 10 years     Depression All Adults Once a year Yes Today PHQ-9 = 16  Diabetes Screening Blood glucose, post glucose load, or GTT Z13.1  All adults at risk  Pre-diabetics  Once per year  Twice per year     Diabetes  Self-Management Training All adults Diabetics 10 hrs first year; 2 hours subsequent years. Requires Copay     Glaucoma  Diabetics  Family history of glaucoma  African Americans 31 yrs +  Hispanic Americans 18 yrs + Annually - requires coppay     Hepatitis C Z72.89 or F19.20  High Risk for HCV  Born between 1945 and 1965  Annually  Once     HIV Z11.4 All adults based on risk  Annually btw ages 50 & 69 regardless of risk  Annually > 65 yrs if at increased risk     Lung Cancer Screening Asymptomatic adults aged 59-77 with 30 pack yr history and current smoker OR quit within the last 15 yrs Annually Must have counseling and shared decision making documentation before first screen     Medical Nutrition Therapy Adults with   Diabetes  Renal disease  Kidney transplant within past 3 yrs 3 hours first year; 2 hours subsequent years     Obesity and Counseling All adults Screening once a year Counseling if BMI 30 or higher  Today   Tobacco Use Counseling Adults who use tobacco  Up to 8 visits in one year     Vaccines Z23  Hepatitis B  Influenza    Pneumonia  Adults   Once  Once every flu season  Two different vaccines separated by one year Yes  Flu vaccine at Pharmacy or next visit with Dr. Frederico Hamman  Next Annual Wellness Visit People with Medicare Every year  Today     [place] Women Who How Often Need  Date of Last Service Action  Mammogram  Z12.31 Women over 53 One baseline ages 15-39. Annually ager 40 yrs+     Pap tests All women Annually if high risk. Every 2 yrs for normal risk women     Screening for cervical cancer with   Pap (Z01.419 nl or Z01.411abnl) &  HPV Z11.51 Women aged 63 to 54 Once every 5 yrs     Screening pelvic and breast exams All women Annually if high risk. Every 2 yrs for normal risk women     Sexually Transmitted Diseases  Chlamydia  Gonorrhea  Syphilis All at risk adults Annually for non pregnant females at increased risk         [place] Men Who How Ofter Need  Date of Last Service Action  Prostate Cancer - DRE & PSA Men over 50 Annually.  DRE might require a copay.  Sexually Transmitted Diseases  Syphilis All at risk adults Annually for men at increased risk         Things That May Be Affecting Your Health:  Alcohol  Hearing loss  Pain   X Depression  Home Safety X Sexual Health   Diabetes  Lack of physical activity  Stress   Difficulty with daily activities  Loneliness  Tiredness   Drug use  Medicines  Tobacco use   Falls  Motor Vehicle Safety X Weight   Food choices  Oral Health  Other    YOUR PERSONALIZED HEALTH PLAN : 1. Schedule your next subsequent Medicare Wellness visit in one year 2. Attend all of your regular appointments to address your medical issues 3. Complete the preventative screenings and services 4. Call Nurse at Psychiatrist's office to schedule bloodwork 5. Keep appt with Dr. Frederico Hamman on 12/01/2018 at 3:15 6. Call for sooner appt if needed for pressure behind ears 7. Let us know if you receive flu vaccine elsewhere, otherwise  we will give it to you on 12/01/2018 8. Begin seated and standing exercises with exercise band.    Fall Prevention in the Home, Adult Falls can cause injuries. They can happen to people of all ages. There are many things you can do to make your home safe and to help prevent falls. Ask for help when making these changes, if needed. What actions can I take to prevent falls? General Instructions  Use good lighting in all rooms. Replace any light bulbs that burn out.  Turn on the lights when you go into a dark area. Use night-lights.  Keep items that you use often in easy-to-reach places. Lower the shelves around your home if necessary.  Set up your furniture so you have a clear path. Avoid moving your furniture around.  Do not have throw rugs and other things on the floor that can make you trip.  Avoid walking on wet floors.  If any of your floors are uneven, fix them.  Add color or contrast paint or tape to clearly mark and help you see: ? Any grab bars or handrails. ? First and last steps of stairways. ? Where the edge of each step is.  If you use a stepladder: ? Make sure that it is fully opened. Do not climb a closed stepladder. ? Make sure that both sides of the stepladder are locked into place. ? Ask someone to hold the stepladder for you while you use it.  If there are any pets around you, be aware of where they are. What can I do in the bathroom?      Keep the floor dry. Clean up any water that spills onto the floor as soon as it happens.  Remove soap buildup in the tub or shower regularly.  Use non-skid mats or decals on the floor of the tub or shower.  Attach bath mats securely with double-sided, non-slip rug tape.  If you need to sit down in the shower, use a plastic, non-slip stool.  Install grab bars by the toilet and in the tub and shower. Do not use towel bars as grab bars. What can I do in the bedroom?  Make sure that you have a light by your bed that  is easy to reach.  Do not use any sheets or blankets that are too big for your bed. They should not hang down onto the floor.  Have a firm chair that has side arms. You can use this for  support while you get dressed. What can I do in the kitchen?  Clean up any spills right away.  If you need to reach something above you, use a strong step stool that has a grab bar.  Keep electrical cords out of the way.  Do not use floor polish or wax that makes floors slippery. If you must use wax, use non-skid floor wax. What can I do with my stairs?  Do not leave any items on the stairs.  Make sure that you have a light switch at the top of the stairs and the bottom of the stairs. If you do not have them, ask someone to add them for you.  Make sure that there are handrails on both sides of the stairs, and use them. Fix handrails that are broken or loose. Make sure that handrails are as long as the stairways.  Install non-slip stair treads on all stairs in your home.  Avoid having throw rugs at the top or bottom of the stairs. If you do have throw rugs, attach them to the floor with carpet tape.  Choose a carpet that does not hide the edge of the steps on the stairway.  Check any carpeting to make sure that it is firmly attached to the stairs. Fix any carpet that is loose or worn. What can I do on the outside of my home?  Use bright outdoor lighting.  Regularly fix the edges of walkways and driveways and fix any cracks.  Remove anything that might make you trip as you walk through a door, such as a raised step or threshold.  Trim any bushes or trees on the path to your home.  Regularly check to see if handrails are loose or broken. Make sure that both sides of any steps have handrails.  Install guardrails along the edges of any raised decks and porches.  Clear walking paths of anything that might make someone trip, such as tools or rocks.  Have any leaves, snow, or ice cleared  regularly.  Use sand or salt on walking paths during winter.  Clean up any spills in your garage right away. This includes grease or oil spills. What other actions can I take?  Wear shoes that: ? Have a low heel. Do not wear high heels. ? Have rubber bottoms. ? Are comfortable and fit you well. ? Are closed at the toe. Do not wear open-toe sandals.  Use tools that help you move around (mobility aids) if they are needed. These include: ? Canes. ? Walkers. ? Scooters. ? Crutches.  Review your medicines with your doctor. Some medicines can make you feel dizzy. This can increase your chance of falling. Ask your doctor what other things you can do to help prevent falls. Where to find more information  Centers for Disease Control and Prevention, STEADI: https://garcia.biz/  [employer] on Aging: BrainJudge.co.uk Contact a doctor if:  You are afraid of falling at home.  You feel weak, drowsy, or dizzy at home.  You fall at home. Summary  There are many simple things that you can do to make your home safe and to help prevent falls.  Ways to make your home safe include removing tripping hazards and installing grab bars in the bathroom.  Ask for help when making these changes in your home. This information is not intended to replace advice given to you by your health care provider. Make sure you discuss any questions you have with your health care provider. Document Released:  12/02/2008 Document Revised: 05/29/2018 Document Reviewed: 09/20/2016 Elsevier Patient Education  2020 [place] Maintenance, Female Adopting a healthy lifestyle and getting preventive care are important in promoting health and wellness. Ask your health care provider about:  The right schedule for you to have regular tests and exams.  Things you can do on your own to prevent diseases and keep yourself healthy. What should I know about diet, weight, and exercise? Eat a healthy  diet   Eat a diet that includes plenty of vegetables, fruits, low-fat dairy products, and lean protein.  Do not eat a lot of foods that are high in solid fats, added sugars, or sodium. Maintain a healthy weight Body mass index (BMI) is used to identify weight problems. It estimates body fat based on height and weight. Your health care provider can help determine your BMI and help you achieve or maintain a healthy weight. Get regular exercise Get regular exercise. This is one of the most important things you can do for your health. Most adults should:  Exercise for at least 150 minutes each week. The exercise should increase your heart rate and make you sweat (moderate-intensity exercise).  Do strengthening exercises at least twice a week. This is in addition to the moderate-intensity exercise.  Spend less time sitting. Even light physical activity can be beneficial. Watch cholesterol and blood lipids Have your blood tested for lipids and cholesterol at 39 years of age, then have this test every 5 years. Have your cholesterol levels checked more often if:  Your lipid or cholesterol levels are high.  You are older than 39 years of age.  You are at high risk for heart disease. What should I know about cancer screening? Depending on your health history and family history, you may need to have cancer screening at various ages. This may include screening for:  Breast cancer.  Cervical cancer.  Colorectal cancer.  Skin cancer.  Lung cancer. What should I know about heart disease, diabetes, and high blood pressure? Blood pressure and heart disease  High blood pressure causes heart disease and increases the risk of stroke. This is more likely to develop in people who have high blood pressure readings, are of African descent, or are overweight.  Have your blood pressure checked: ? Every 3-5 years if you are 71-35 years of age. ? Every year if you are 70 years old or older. Diabetes  Have regular diabetes screenings. This checks your fasting blood sugar level. Have the screening done:  Once every three years after age 47 if you are at a normal weight and have a low risk for diabetes.  More often and at a younger age if you are overweight or have a high risk for diabetes. What should I know about preventing infection? Hepatitis B If you have a higher risk for hepatitis B, you should be screened for this virus. Talk with your health care provider to find out if you are at risk for hepatitis B infection. Hepatitis C Testing is recommended for:  Everyone born from [birth year] through 1966.  Anyone with known risk factors for hepatitis C. Sexually transmitted infections (STIs)  Get screened for STIs, including gonorrhea and chlamydia, if: ? You are sexually active and are younger than 39 years of age. ? You are older than 39 years of age and your health care provider tells you that you are at risk for this type of infection. ? Your sexual activity has changed since you were last  screened, and you are at increased risk for chlamydia or gonorrhea. Ask your health care provider if you are at risk.  Ask your health care provider about whether you are at high risk for HIV. Your health care provider may recommend a prescription medicine to help prevent HIV infection. If you choose to take medicine to prevent HIV, you should first get tested for HIV. You should then be tested every 3 months for as long as you are taking the medicine. Pregnancy  If you are about to stop having your period (premenopausal) and you may become pregnant, seek counseling before you get pregnant.  Take 400 to 800 micrograms (mcg) of folic acid every day if you become pregnant.  Ask for birth control (contraception) if you want to prevent pregnancy. Osteoporosis and menopause Osteoporosis is a disease in which the bones lose minerals and strength with aging. This can result in bone fractures. If you are 46  years old or older, or if you are at risk for osteoporosis and fractures, ask your health care provider if you should:  Be screened for bone loss.  Take a calcium or vitamin D supplement to lower your risk of fractures.  Be given hormone replacement therapy (HRT) to treat symptoms of menopause. Follow these instructions at home: Lifestyle  Do not use any products that contain nicotine or tobacco, such as cigarettes, e-cigarettes, and chewing tobacco. If you need help quitting, ask your health care provider.  Do not use street drugs.  Do not share needles.  Ask your health care provider for help if you need support or information about quitting drugs. Alcohol use  Do not drink alcohol if: ? Your health care provider tells you not to drink. ? You are pregnant, may be pregnant, or are planning to become pregnant.  If you drink alcohol: ? Limit how much you use to 0-1 drink a day. ? Limit intake if you are breastfeeding.  Be aware of how much alcohol is in your drink. In the [place] one drink equals one 12 oz bottle of beer (355 mL), one 5 oz glass of wine (148 mL), or one 1 oz glass of hard liquor (44 mL). General instructions  Schedule regular health, dental, and eye exams.  Stay current with your vaccines.  Tell your health care provider if: ? You often feel depressed. ? You have ever been abused or do not feel safe at home. Summary  Adopting a healthy lifestyle and getting preventive care are important in promoting health and wellness.  Follow your health care provider's instructions about healthy diet, exercising, and getting tested or screened for diseases.  Follow your health care provider's instructions on monitoring your cholesterol and blood pressure. This information is not intended to replace advice given to you by your health care provider. Make sure you discuss any questions you have with your health care provider. Document Released: 08/21/2010 Document Revised:  01/29/2018 Document Reviewed: 01/29/2018 Elsevier Patient Education  2020 [employer].

## 2018-11-03 NOTE — Progress Notes (Signed)
Virtual Visit via Video Note  I connected with Jordan [name] on 11/03/18 at  4:30 PM EDT by a video enabled telemedicine application and verified that I am speaking with the correct person using two identifiers.     I discussed the limitations of evaluation and management by telemedicine and the availability of in person appointments. The patient expressed understanding and agreed to proceed.  Type of Therapy: Individual Therapy  Treatment Goals addressed: improve psychiatric symptoms, controlled behavior, moderate mood, and deliberate speech (improved social and work functioning and decrease impulsive behavior), improve unhelpful thought patterns, elevate mood (increased hopefulness and social interactions), interpersonal relationship skills, discuss and process trauma, learn about diagnosis, healthy coping skills  Interventions: Motivational Interviewing, CBT, Grounding and Mindfulness Techniques  Summary: Jordan [name] is a 38 y.o. female who presents with Moderate mixed bipolar I disorder  Suicidal/Homicidal: No -without intent/plan  Therapist Response:  Jordan [name] met with clinician for an individual session. Jordan [name] discussed her psychiatric symptoms, her current life events and her homework. Jordan [name] shared that she is doing much better now than last time we spoke. She reports she came up to [place] to see her girlfriend last week and she is staying one more. She reports she has been able to work her [employer] job while she has been there and has been exploring the city. Clinician explored options for apartments and jobs there, as her plan is to move. Clinician discussed transitioning therapists, which Jordan [name] had concerns about, but clinician agreed to assist in that process. Timothy discussed the relationship with her girlfriend and noted that she "watches" her and pays attention to her body language. Clinician identified this as a sign of attunement and attachment, which is very positive and  has not been present or really available in the past several relationships. Clinician encouraged Jordan [name] to allow herself to be a little vulnerable and to be close. Clinician also identified the value in having someone to care for her, since she is usually the one taking care of everyone else.   Plan: Return again in 2 weeks.  Diagnosis: Axis I: Moderate mixed bipolar I disorder   I discussed the assessment and treatment plan with the patient. The patient was provided an opportunity to ask questions and all were answered. The patient agreed with the plan and demonstrated an understanding of the instructions.   The patient was advised to call back or seek an in-person evaluation if the symptoms worsen or if the condition fails to improve as anticipated.  I provided 45 minutes of non-face-to-face time during this encounter.   [name], LCSW

## 2018-11-05 ENCOUNTER — Encounter: Payer: Self-pay | Admitting: Internal Medicine

## 2018-11-19 ENCOUNTER — Other Ambulatory Visit: Payer: Self-pay

## 2018-11-19 ENCOUNTER — Encounter (HOSPITAL_COMMUNITY): Payer: Self-pay | Admitting: Licensed Clinical Social Worker

## 2018-11-19 ENCOUNTER — Ambulatory Visit (INDEPENDENT_AMBULATORY_CARE_PROVIDER_SITE_OTHER): Payer: Medicare Other | Admitting: Licensed Clinical Social Worker

## 2018-11-19 DIAGNOSIS — F3162 Bipolar disorder, current episode mixed, moderate: Secondary | ICD-10-CM | POA: Diagnosis not present

## 2018-11-19 NOTE — Progress Notes (Signed)
Virtual Visit via Video Note  I connected with Jordan [name] on 11/19/18 at  4:30 PM EDT by a video enabled telemedicine application and verified that I am speaking with the correct person using two identifiers.    I discussed the limitations of evaluation and management by telemedicine and the availability of in person appointments. The patient expressed understanding and agreed to proceed.  Type of Therapy: Individual Therapy  Treatment Goals addressed: improve psychiatric symptoms, controlled behavior, moderate mood, and deliberate speech (improved social and work functioning and decrease impulsive behavior), improve unhelpful thought patterns, elevate mood (increased hopefulness and social interactions), interpersonal relationship skills, discuss and process trauma, learn about diagnosis, healthy coping skills  Interventions: Motivational Interviewing, CBT, Grounding and Mindfulness Techniques  Summary: Jordan [name] is a 38 y.o. female who presents with Moderate mixed bipolar I disorder  Suicidal/Homicidal: No -without intent/plan  Therapist Response:  Palmer met with clinician for an individual session. Allyssia discussed her psychiatric symptoms, her current life events and her homework. Adamarys shared that she had a wonderful visit to [place] with her girlfriend and she is even more prepared now to move. She reported several issues with her current apartment that continues to reinforce her plans. 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 Clinician encouraged Lasonja to reach out to the supervisor to move things along more quickly. Rochel reports plan to move in early 2021.  Clinician discussed relationships with family members, particularly mom. Infinity processed childhood with mom and noted that she continues to have attachment issues with mom from then. Clinician provided psychoeducation about attachment and noted  that there will likely always be a little part of her that wants her mom to love her. Clinician identified the role of drugs in mom's life and noted that drugs have been her top priority for a long time and that won't stop until mom gets clean. Mardie processed thoughts and feelings about this. Pairlee identified the usefulness of therapy in her life due to having so much anger as a child and moving through her adult years with support.   Plan: Return again in 2 weeks.  Diagnosis: Axis I: Moderate mixed bipolar I disorder   I discussed the assessment and treatment plan with the patient. The patient was provided an opportunity to ask questions and all were answered. The patient agreed with the plan and demonstrated an understanding of the instructions.   The patient was advised to call back or seek an in-person evaluation if the symptoms worsen or if the condition fails to improve as anticipated.  I provided 50 minutes of non-face-to-face time during this encounter.   [name], LCSW

## 2018-12-01 ENCOUNTER — Encounter: Payer: Self-pay | Admitting: Dietician

## 2018-12-01 ENCOUNTER — Ambulatory Visit (INDEPENDENT_AMBULATORY_CARE_PROVIDER_SITE_OTHER): Payer: Medicare Other | Admitting: Dietician

## 2018-12-01 ENCOUNTER — Encounter (HOSPITAL_COMMUNITY): Payer: Self-pay | Admitting: Licensed Clinical Social Worker

## 2018-12-01 ENCOUNTER — Ambulatory Visit (INDEPENDENT_AMBULATORY_CARE_PROVIDER_SITE_OTHER): Payer: Medicare Other | Admitting: Internal Medicine

## 2018-12-01 ENCOUNTER — Other Ambulatory Visit: Payer: Self-pay

## 2018-12-01 ENCOUNTER — Ambulatory Visit (INDEPENDENT_AMBULATORY_CARE_PROVIDER_SITE_OTHER): Payer: Medicare Other | Admitting: Licensed Clinical Social Worker

## 2018-12-01 ENCOUNTER — Encounter: Payer: Self-pay | Admitting: Internal Medicine

## 2018-12-01 DIAGNOSIS — Z23 Encounter for immunization: Secondary | ICD-10-CM | POA: Diagnosis not present

## 2018-12-01 DIAGNOSIS — F3162 Bipolar disorder, current episode mixed, moderate: Secondary | ICD-10-CM | POA: Diagnosis not present

## 2018-12-01 DIAGNOSIS — Z713 Dietary counseling and surveillance: Secondary | ICD-10-CM

## 2018-12-01 DIAGNOSIS — Z6837 Body mass index (BMI) 37.0-37.9, adult: Secondary | ICD-10-CM | POA: Diagnosis not present

## 2018-12-01 DIAGNOSIS — E669 Obesity, unspecified: Secondary | ICD-10-CM

## 2018-12-01 NOTE — Patient Instructions (Signed)
To help you get healthier  Keep with your planned exercise schedule  Keep a journal of hunger feelings  Calories in trail mix vs and apple--- LUNCH----????  See you in two weeks.   Butch Penny [phone]

## 2018-12-01 NOTE — Patient Instructions (Signed)
Ms. Sze,   Come back to see me in 3 months to check up on your weight loss progress. Come back to see me sooner if you need to.   - Dr. Frederico Hamman

## 2018-12-01 NOTE — Progress Notes (Signed)
Virtual Visit via Video Note  I connected with Jordan [name] on 12/01/18 at  4:30 PM EDT by a video enabled telemedicine application and verified that I am speaking with the correct person using two identifiers.     I discussed the limitations of evaluation and management by telemedicine and the availability of in person appointments. The patient expressed understanding and agreed to proceed.   Type of Therapy: Individual Therapy  Treatment Goals addressed: improve psychiatric symptoms, controlled behavior, moderate mood, and deliberate speech (improved social and work functioning and decrease impulsive behavior), improve unhelpful thought patterns, elevate mood (increased hopefulness and social interactions), interpersonal relationship skills, discuss and process trauma, learn about diagnosis, healthy coping skills  Interventions: Motivational Interviewing, CBT, Grounding and Mindfulness Techniques  Summary: Jordan [name] is a 38 y.o. female who presents with Moderate mixed bipolar I disorder  Suicidal/Homicidal: No -without intent/plan  Therapist Response:  Jordan [name] met with clinician for an individual session. Jordan [name] discussed her psychiatric symptoms, her current life events and her homework. Jordan [name] shared that she has been doing okay. She reports positive interactions with her girlfriend and reports some sadness that she will not see her again until Thanksgiving. Clinician processed thoughts and feelings. Glendell discussed concerns about her girlfriend's ex, who is the "baby daddy" for her son. Clinician explored options and worry about meeting this person, sharing time with the child, and pre-emptive fears that there will be drama. Clinician validated those concerns and encouraged Jordan [name] to talk to girlfriend about ways to schedule the child's time, and also to have Jordan [name] involved in the scheduling. Jordan [name] reports she will have the conversation and needed some help with phrasing.   Jordan [name] discussed concerns about mom, noting ongoing drug abuse, financial troubles, and overall instability. Jordan [name] identified some feelings of guilt and sadness that her mother has gotten herself in this situation and continues to set strong boundaries with mom in order to protect herself emotionally.   Plan: Return again in 2 weeks.  Diagnosis: Axis I: Moderate mixed bipolar I disorder  I discussed the assessment and treatment plan with the patient. The patient was provided an opportunity to ask questions and all were answered. The patient agreed with the plan and demonstrated an understanding of the instructions.   The patient was advised to call back or seek an in-person evaluation if the symptoms worsen or if the condition fails to improve as anticipated.  I provided 45 minutes of non-face-to-face time during this encounter.   [name], LCSW

## 2018-12-02 ENCOUNTER — Encounter: Payer: Self-pay | Admitting: Internal Medicine

## 2018-12-02 ENCOUNTER — Telehealth (HOSPITAL_COMMUNITY): Payer: Self-pay

## 2018-12-02 ENCOUNTER — Encounter: Payer: Self-pay | Admitting: Dietician

## 2018-12-02 DIAGNOSIS — Z79899 Other long term (current) drug therapy: Secondary | ICD-10-CM | POA: Diagnosis not present

## 2018-12-02 NOTE — Telephone Encounter (Signed)
Jordan [name] came in for labs this morning, she said she needs a refill on the Depakote, but wanted you to get her levels first. They should result in the morning.
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
## 2018-12-02 NOTE — Telephone Encounter (Signed)
She had blood work but results are not back.  I have to wait for the Depakote level to renew her medication.

## 2018-12-02 NOTE — Assessment & Plan Note (Addendum)
Patient presented with concerns about her weight. She has gained 15 lbs in 9 months and  is currently at 256 lbs with a BMI of 37.8. She used to be active in sports prior to the pandemic and was able to lose some weight (lowest 230 lbs) but now that she is mostly home she stopped exercising and has not had a healthy diet. She met with Butch Penny today and talked about healthier food choices. They have planned to meet biweekly top track her weight loss progress. She also enrolled at the [facility] one day ago and is planning to do 30 minutes of cardio at least 3x per week. She seems highly motivated. She will follow up with me in 3 months to see how she progresses.

## 2018-12-03 NOTE — Progress Notes (Addendum)
Intensive Behavioral Therapy for Obesity ( visit #1 ) : Start time:1418  end time: [id number].Ms Naramore says she would like to weigh 190# again. Her lowest weigh ton our record is ~ 202# in 2017 which gave her A BMI of 29.8.  Her current Estimated body mass index is 37.82 kg/m as calculated from the following:   Height as of 11/03/18: 5\' 9"  (1.753 m).   Weight as of this encounter: 256 lb 1.6 oz (116.2 kg).  Wt Readings from Last 5 Encounters:  12/01/18 256 lb 3.2 oz (116.2 kg)  12/01/18 256 lb 1.6 oz (116.2 kg)  11/03/18 250 lb (113.4 kg)  01/21/18 242 lb 12.8 oz (110.1 kg)  12/20/17 247 lb 11.2 oz (112.4 kg)   2. Dietary (nutritional) assessment;  Ms Lull reports a reasonable intake with well balanced meals recently. She is  eating 2 meals a day breakfast at [phone] and dinner at 5 PM,  a substantial 'snack' ~ 12 noon and a light snack ~ 7 PM. . She is drinking mostly water. She is eating lean proteins and vegetables and fruits and some whole grains and nuts. She reports emotional eating as a problem to controlling choices and portions. She started back to the gyn 3 days a week for 45 minutes today and will try to also do 30 minutes of aerobic activity on those days.  3. Intensive behavioral counseling and behavioral therapy to promote sustained  weight loss 1. Assessment of behavioral health risks and factors affecting choice of behavior goals: she is having trouble sleeping with PTSD being problematic. She sees a Social worker every two weeks, 2. Advise provided today:  Note hunger feelings on hunger scale  and try to eat when about a 6" 3. Collaboratively set goals: see  After visit summary 4. Assist: education about nutrients/calories in foods, snacks- how to measure/estimate. Patient verbalized understanding and used teach-back as well to demonstrate understanding.  5. Arrange follow up: appointment scheduled for 2 weeks per patient preference Debera Lat, RD 12/03/2018 9:46 AM.

## 2018-12-04 LAB — VALPROIC ACID LEVEL: Valproic Acid Lvl: 73 ug/mL (ref 50–100)

## 2018-12-04 LAB — HEMOGLOBIN A1C
Est. average glucose Bld gHb Est-mCnc: 111 mg/dL
Hgb A1c MFr Bld: 5.5 % (ref 4.8–5.6)

## 2018-12-04 NOTE — Progress Notes (Signed)
Internal Medicine Clinic Attending  Case discussed with Dr. Santos-Sanchez at the time of the visit.  We reviewed the resident's history and exam and pertinent patient test results.  I agree with the assessment, diagnosis, and plan of care documented in the resident's note.  Alexander Raines, M.D., Ph.D.  

## 2018-12-08 ENCOUNTER — Other Ambulatory Visit: Payer: Self-pay | Admitting: Podiatry

## 2018-12-15 ENCOUNTER — Encounter (HOSPITAL_COMMUNITY): Payer: Self-pay | Admitting: Licensed Clinical Social Worker

## 2018-12-15 ENCOUNTER — Ambulatory Visit (INDEPENDENT_AMBULATORY_CARE_PROVIDER_SITE_OTHER): Payer: Medicare Other | Admitting: Licensed Clinical Social Worker

## 2018-12-15 ENCOUNTER — Other Ambulatory Visit: Payer: Self-pay

## 2018-12-15 ENCOUNTER — Ambulatory Visit: Payer: Medicare Other | Admitting: Dietician

## 2018-12-15 ENCOUNTER — Encounter: Payer: Self-pay | Admitting: Dietician

## 2018-12-15 DIAGNOSIS — F3162 Bipolar disorder, current episode mixed, moderate: Secondary | ICD-10-CM
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
## 2018-12-15 NOTE — Patient Instructions (Addendum)
Your plan for the next two weeks-   1- Eat before exercise carb and protein  2- Try lower intensity longer aerobic activity for fat burning  3- make your to go to meal list- consider posting it around the frig; think about what is good for you,  what feels good and what your body needs combine this to make a list...  Keep doing what you are doing regarding exercise and food journalling.

## 2018-12-15 NOTE — Progress Notes (Addendum)
Documentation: Start time:1318   end time: 1345  Total time: 27 minutes  1.Ms . Gremminger has been exercising at a gym 3 days a week lifting weights and doing a stair stepper. She also does an aerobic work out on [employer] ~ 2 days a week.She realizes that when she doesn't;t sleep or waits too long to eat, it is harder to control what choices she makes.  Today her weight is decreased ~ 1#. Her  Estimated body mass index is 37.76 kg/m as calculated from the following:   Height as of 12/01/18: 5\' 9"  (1.753 m).   Weight as of this encounter: 255 lb 11.2 oz (116 kg).  Wt Readings from Last 5 Encounters:  12/15/18 255 lb 11.2 oz (116 kg)  12/01/18 256 lb 3.2 oz (116.2 kg)  12/01/18 256 lb 1.6 oz (116.2 kg)  11/03/18 250 lb (113.4 kg)  01/21/18 242 lb 12.8 oz (110.1 kg)   2. Dietary (nutritional) assessment;  Ms Charbonnet reports reasonable food intake except one day when she had an emotional day. We talked about making a go to food list of foods that are comforting and good for her that she would eat during stressful times. She also discussed meal prepping.   3. Intensive behavioral counseling and behavioral therapy to promote sustained  weight loss 1. Assessment of behavioral health risks and factors affecting choice of behavior goals: she has trouble sleeping due to PTSD. She sees a Social worker every two weeks. 2. Advise provided today:  Eat before exercise in am, meal prepping is a good habit to get into.  3. Collaboratively set goals: see  After visit summary 4. Assist: education about types of activity and the main fuel needed for that type.  5. Arrange follow up: appointment scheduled for 2 weeks per patient preference Debera Lat, RD 12/15/2018 1:59 PM.

## 2018-12-29 ENCOUNTER — Ambulatory Visit (INDEPENDENT_AMBULATORY_CARE_PROVIDER_SITE_OTHER): Payer: Medicare Other | Admitting: Licensed Clinical Social Worker

## 2018-12-29 ENCOUNTER — Encounter: Payer: Self-pay | Admitting: Dietician

## 2018-12-29 ENCOUNTER — Other Ambulatory Visit: Payer: Self-pay

## 2018-12-29 ENCOUNTER — Ambulatory Visit: Payer: Medicare Other | Admitting: Dietician

## 2018-12-29 ENCOUNTER — Encounter (HOSPITAL_COMMUNITY): Payer: Self-pay | Admitting: Licensed Clinical Social Worker

## 2018-12-29 DIAGNOSIS — F3162 Bipolar disorder, current episode mixed, moderate: Secondary | ICD-10-CM

## 2018-12-29 DIAGNOSIS — Z713 Dietary counseling and surveillance: Secondary | ICD-10-CM

## 2018-12-29 DIAGNOSIS — Z6837 Body mass index (BMI) 37.0-37.9, adult: Secondary | ICD-10-CM

## 2018-12-29 NOTE — Progress Notes (Signed)
Documentation: Start time:1332   end time: 1422  Total time: 50 minutes  1. Ms. Roszak mentions her planter fascitis has been really bothering her for the past week. She mentions instead of usually activity she did home workouts (yoga and piliates) to substitute for her usual activity. Overall Ms. Felmlee seems to be in a good mood.    Today her weight is decreased ~ 1#. Her  Estimated body mass index is 37.69 kg/m as calculated from the following:   Height as of 12/01/18: 5\' 9"  (1.753 m).   Weight as of this encounter: 255 lb 3.2 oz (115.8 kg).  Wt Readings from Last 5 Encounters:  12/29/18 255 lb 3.2 oz (115.8 kg)  12/15/18 255 lb 11.2 oz (116 kg)  12/01/18 256 lb 3.2 oz (116.2 kg)  12/01/18 256 lb 1.6 oz (116.2 kg)  11/03/18 250 lb (113.4 kg)   2. Dietary (nutritional) assessment; Ms. Schmucker mentioned she is doing good with her to-go foods and her intake seems to be doing well. She is aware of the things she is eating (certain foods upset her stomach and mindfulness cues to hunger) Mentions sensitivity to super sweet items, so food may be on the bland side, which indicates the importance of variety in her diet. (she mentions this as well)  3. Intensive behavioral counseling and behavioral therapy to promote sustained  weight loss  1. Assessment of progress from prior visit (to go meals, meal planning, activity)  2. Advise provided today:  Ways to meal plan effectively, planned thanksgiving meal to combat what other family member are eating, suggested ways to exercise when plantar fascitis is inflamed.  3. Agree (collaboratively): see  After visit summary  4. Assist: Ms. Milward wrote down how she is going to meal plan, Thanksgiving handout, food planner handout   5. Arrange follow up: appointment scheduled for 3 weeks per patient preference Debera Lat, RD 12/29/2018 2:24 PM.

## 2018-12-29 NOTE — Progress Notes (Signed)
Virtual Visit via Video Note  I connected with DEFNE GERLING on 12/29/18 at  4:30 PM EST by a video enabled telemedicine application and verified that I am speaking with the correct person using two identifiers.    I discussed the limitations of evaluation and management by telemedicine and the availability of in person appointments. The patient expressed understanding and agreed to proceed.  Type of Therapy: Individual Therapy  Treatment Goals addressed: improve psychiatric symptoms, controlled behavior, moderate mood, and deliberate speech (improved social and work functioning and decrease impulsive behavior), improve unhelpful thought patterns, elevate mood (increased hopefulness and social interactions), interpersonal relationship skills, discuss and process trauma, learn about diagnosis, healthy coping skills  Interventions: Motivational Interviewing, CBT, Grounding and Mindfulness Techniques  Summary: JOEY HUDOCK is a 38y.o. female who presents with Moderate mixed bipolar I disorder  Suicidal/Homicidal: No -without intent/plan  Therapist Response:  Asyah met with clinician for an individual session. Annya discussed her psychiatric symptoms, her current life events and her homework. Kendall sharedthat shehas been in a lot of pain and she is back wearing a boot on her foot with ice. Clinician explored the pain levels and noted that she is not taking pain meds unless it is way too much to deal with. However, she does report that due to her pain, she is less tolerant of anyone and anything that bothers her. Clinician discussed options for reducing the dose of meds, taking half a pill at a time in order to not get so "high", or to focus more on using ice to relieve some pain. Abia spent time processing plans to move, noting that her girlfriend now wants to move to [facility]. Clinician processed options and encouraged Yong to continue asking questions about the plan. Clinician noted  that the feelings she has about wanting to be close to girlfriend is a good sign and that not every move has to be permanent.   Plan: Return again in 2 weeks.  Diagnosis: Axis I: Moderate mixed bipolar I disorder    I discussed the assessment and treatment plan with the patient. The patient was provided an opportunity to ask questions and all were answered. The patient agreed with the plan and demonstrated an understanding of the instructions.   The patient was advised to call back or seek an in-person evaluation if the symptoms worsen or if the condition fails to improve as anticipated.  I provided 50 minutes of non-face-to-face time during this encounter.   [name], LCSW

## 2018-12-29 NOTE — Patient Instructions (Addendum)
Wt Readings from Last 5 Encounters:  12/29/18 255 lb 3.2 oz (115.8 kg)  12/15/18 255 lb 11.2 oz (116 kg)  12/01/18 256 lb 3.2 oz (116.2 kg)  12/01/18 256 lb 1.6 oz (116.2 kg)  11/03/18 250 lb (113.4 kg)    Try level 1 for longer periods of time-  Consider the  Eliptical for lower impact OR  A rower  Or Yoga or Pilates  Keep thinking about your hunger level and try to eat when a 4 not a 5 or 6.   Have my healthy snacks available.   Enjoy your thanksgiving and your family!

## 2019-01-12 ENCOUNTER — Ambulatory Visit (INDEPENDENT_AMBULATORY_CARE_PROVIDER_SITE_OTHER): Payer: Medicare Other | Admitting: Licensed Clinical Social Worker

## 2019-01-12 ENCOUNTER — Encounter (HOSPITAL_COMMUNITY): Payer: Self-pay | Admitting: Licensed Clinical Social Worker

## 2019-01-12 ENCOUNTER — Other Ambulatory Visit: Payer: Self-pay

## 2019-01-12 DIAGNOSIS — F3162 Bipolar disorder, current episode mixed, moderate: Secondary | ICD-10-CM

## 2019-01-12 NOTE — Progress Notes (Signed)
Virtual Visit via Video Note  I connected with [name] on 01/12/19 at  4:30 PM EST by a video enabled telemedicine application and verified that I am speaking with the correct person using two identifiers.     I discussed the limitations of evaluation and management by telemedicine and the availability of in person appointments. The patient expressed understanding and agreed to proceed.  Type of Therapy: Individual Therapy  Treatment Goals addressed: improve psychiatric symptoms, controlled behavior, moderate mood, and deliberate speech (improved social and work functioning and decrease impulsive behavior), improve unhelpful thought patterns, elevate mood (increased hopefulness and social interactions), interpersonal relationship skills, discuss and process trauma, learn about diagnosis, healthy coping skills  Interventions: Motivational Interviewing, CBT, Grounding and Mindfulness Techniques  Summary: Jordan [name] is a 38y.o. female who presents with Moderate mixed bipolar I disorder  Suicidal/Homicidal: No -without intent/plan  Therapist Response:  Kyleen met with clinician for an individual session. Tabby discussed her psychiatric symptoms, her current life events and her homework. Carlethia sharedthat shehas started working at [employer] and it is more than she expected. She reports difficulty focusing, likely due to feeling overwhelmed and tired. Clinician discussed utilizing meditation and mindfulness in order to focus attention. Clinician processed through plans about moving to [place]. Chianne has already applied to housing and was accepted, but has no feedback about timeframes when an apartment would be available for her. Clinician discussed thoughts and feelings. Kensly also identified the possibility of girlfriend wanting to have another baby. Sianne agreed, but also knows that this is in the 5 year plan, not the 1 year plan. Clinician discussed feelings about raising  another child and noted the difference if it is a child with her girlfriend, rather than her niece on her own.  Clinician processed recent interactions with mother and noted ongoing struggles with her using drugs and drinking.   Plan: Return again in 2 weeks.  Diagnosis: Axis I: Moderate mixed bipolar I disorder    I discussed the assessment and treatment plan with the patient. The patient was provided an opportunity to ask questions and all were answered. The patient agreed with the plan and demonstrated an understanding of the instructions.   The patient was advised to call back or seek an in-person evaluation if the symptoms worsen or if the condition fails to improve as anticipated.  I provided 40 minutes of non-face-to-face time during this encounter.   [name], LCSW

## 2019-01-19 ENCOUNTER — Ambulatory Visit: Payer: Medicare Other | Admitting: Dietician

## 2019-01-20 ENCOUNTER — Encounter (HOSPITAL_COMMUNITY): Payer: Self-pay | Admitting: Psychiatry

## 2019-01-20 ENCOUNTER — Other Ambulatory Visit: Payer: Self-pay

## 2019-01-20 ENCOUNTER — Ambulatory Visit (INDEPENDENT_AMBULATORY_CARE_PROVIDER_SITE_OTHER): Payer: Medicare Other | Admitting: Psychiatry

## 2019-01-20 DIAGNOSIS — F3132 Bipolar disorder, current episode depressed, moderate: Secondary | ICD-10-CM | POA: Diagnosis not present

## 2019-01-20 DIAGNOSIS — F419 Anxiety disorder, unspecified: Secondary | ICD-10-CM

## 2019-01-20 DIAGNOSIS — F4310 Post-traumatic stress disorder, unspecified: Secondary | ICD-10-CM

## 2019-01-20 MED ORDER — DIVALPROEX SODIUM ER 250 MG PO TB24: 750.0000 mg | ORAL_TABLET | Freq: Every day | ORAL | 2 refills | Status: DC

## 2019-01-20 MED ORDER — HYDROXYZINE HCL 10 MG PO TABS: ORAL_TABLET | ORAL | 1 refills | Status: DC
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
## 2019-01-21 ENCOUNTER — Other Ambulatory Visit: Payer: Self-pay

## 2019-01-21 ENCOUNTER — Ambulatory Visit: Payer: Medicare Other | Admitting: Dietician

## 2019-01-21 ENCOUNTER — Encounter: Payer: Self-pay | Admitting: Dietician

## 2019-01-21 NOTE — Patient Instructions (Addendum)
Accountability- you have done this before you can do it again!!!  You are one step further than you were before!!!  Goal is to log something on myfitnes pal every day.   Working out- walk 30- minutes a day-   Food intake- keep records on [employer]

## 2019-01-21 NOTE — Progress Notes (Signed)
Documentation: Start time:0950  end time: Total time:1050 minutes  1. [name] mentions     Her Estimated body mass index is 38.4 kg/m as calculated from the following: Height as of 12/01/18: 5\' 9"  (1.753 m). Weight as of this encounter: 260 lb (117.9 kg). Waist to Hip Ratio: ~0.74 (low risk)      Wt Readings from Last 5 Encounters:  01/21/19 260 lb (117.9 kg)  12/29/18 255 lb 3.2 oz (115.8 kg)  12/15/18 255 lb 11.2 oz (116 kg)  12/01/18 256 lb 3.2 oz (116.2 kg)  12/01/18 256 lb 1.6 oz (116.2 kg)   2. Dietary (nutritional) assessment: [name] reports food intake is going ok. Over the holidays she made her own food but also had food from family members who brought food over. She mentioned she is a lot of pain which affects her food intake and promotes eatings food such as reese. Today she mentioned he had a shake and I doing better with food post holidays. Educated on...  Protein, carbohydrates, fats Education on how adjust calories to to maintain, lose, and gain weight Natural weight loss trend    3. Intensive behavioral counseling and behavioral therapy to promote sustained  weight loss Assessment of progress from prior visit (to go meals, meal planning, activity): Activity level has decreased from falling accident due to pain from seasonal work. Increased back pain has caused patient to increase rehabilitation methods (ice and heat). Mentions she is still trying to stretch. Tried to walk with back pain but was only able to to ~10 minutes. Mentioned she also wants more accountability as it was the biggest motivator to lose weight.   2. Advise provided today:   - Input food intake into MyFitnessPal for 1 week straight no matter the intake amount -Walk for 30 minutes (10x10x10) 3 times this week (only do what pain will allow) -Increase her stretching time to help with back and hip flexor pain and tightness.   3. Agree (collaboratively): see  After visit summary  4. Assist:  Provided multiple handouts (stretching routine, how to handle triggers, calorie requirements, others). Strategized ways to hold the patient more accountable.   5. Arrange follow up: appointment scheduled for 1 weeks per patient preference  Arnold Long, Dietetic Intern  01/21/2019 11:18 AM.

## 2019-01-21 NOTE — Progress Notes (Signed)
I reviewed and approve this note and the plan. Debera Lat, RD 01/21/2019 4:12 PM.

## 2019-01-26 ENCOUNTER — Encounter: Payer: Medicare Other | Admitting: Internal Medicine

## 2019-01-26 ENCOUNTER — Ambulatory Visit (INDEPENDENT_AMBULATORY_CARE_PROVIDER_SITE_OTHER): Payer: Medicare Other | Admitting: Licensed Clinical Social Worker

## 2019-01-26 ENCOUNTER — Encounter (HOSPITAL_COMMUNITY): Payer: Self-pay | Admitting: Licensed Clinical Social Worker

## 2019-01-26 ENCOUNTER — Other Ambulatory Visit: Payer: Self-pay

## 2019-01-26 DIAGNOSIS — F3132 Bipolar disorder, current episode depressed, moderate: Secondary | ICD-10-CM | POA: Diagnosis not present

## 2019-01-26 NOTE — Progress Notes (Signed)
Virtual Visit via Video Note  I connected with Jordan [name] on 01/26/19 at  4:30 PM EST by a video enabled telemedicine application and verified that I am speaking with the correct person using two identifiers.    I discussed the limitations of evaluation and management by telemedicine and the availability of in person appointments. The patient expressed understanding and agreed to proceed.   Type of Therapy: Individual Therapy  Treatment Goals addressed: improve psychiatric symptoms, controlled behavior, moderate mood, and deliberate speech (improved social and work functioning and decrease impulsive behavior), improve unhelpful thought patterns, elevate mood (increased hopefulness and social interactions), interpersonal relationship skills, discuss and process trauma, learn about diagnosis, healthy coping skills  Interventions: Motivational Interviewing, CBT, Grounding and Mindfulness Techniques  Summary: Jordan [name] is a 38y.o. female who presents with Moderate mixed bipolar I disorder  Suicidal/Homicidal: No -without intent/plan  Therapist Response:  Jordan [name] met with clinician for an individual session. Jordan [name] discussed her psychiatric symptoms, her current life events and her homework. Jordan [name] sharedthat shehas been feeling lonely since her girlfriend and son went home after Thanksgiving. She reports she hopes to visit them for Christmas in [place]. However, due to the cold weather and Jordan [name]'s health issues, girlfriend made a statement that she did not think Jordan [name] should move up there. Clinician explored thoughts and feelings, as well as alternative options. Jordan [name] reports that she still wants to move there and be closer, and that she would do whatever it takes to brave the cold. However, Jordan [name] also noted some physical health issues, including a fall over Thanksgiving weekend that presented more challenges. Jordan [name] reports she has been feeling upset about her weight gain. She  started working with a nutritionist and will be making a diet plan that is healthy and reasonable. She hopes to lose 20 lbs by the time she is 39.    Plan: Return again in 2 weeks.   I discussed the assessment and treatment plan with the patient. The patient was provided an opportunity to ask questions and all were answered. The patient agreed with the plan and demonstrated an understanding of the instructions.   The patient was advised to call back or seek an in-person evaluation if the symptoms worsen or if the condition fails to improve as anticipated.  I provided 45 minutes of non-face-to-face time during this encounter.   Jessica R Schlosberg, LCSW  

## 2019-01-28 ENCOUNTER — Ambulatory Visit: Payer: Medicare Other | Admitting: Dietician

## 2019-01-29 ENCOUNTER — Other Ambulatory Visit: Payer: Self-pay

## 2019-01-29 ENCOUNTER — Encounter: Payer: Self-pay | Admitting: Dietician

## 2019-01-29 ENCOUNTER — Ambulatory Visit: Payer: Medicare Other | Admitting: Dietician

## 2019-01-29 NOTE — Patient Instructions (Addendum)
Work on [employer] on a a daily basis- hit your goal most days of the week?   All calories are not created equal- make your calories count- remember they feed your good bacteria that then change hormones that help you  Stretches- good job! Even if you only do the easy ones some ifs better than nothing  Plan ahead foods- make a list to keep on your frig 1. Trail mix,  2. yogurt 3. Pack a meal in your lunch bag 4.  5.  6.  7.

## 2019-01-29 NOTE — Progress Notes (Signed)
Documentation: Start time:1020  end time:1050 Total time:30 minutes   1. Dietary (nutritional) assessment:. Jordan [name] shows her MyfitnessPAL records staying under her goal calore daily, but her weight is increased 5# in 1 week.   she has not been able to walk or do much other exercise for the past week. She ate out several times making poor quality nutritional choices. She states eating out more due to stress and time limits.   Ms. Henningson reports problems with constipation, pain. She states she wants to try to push through the pain some.  2.  Advise provided today:   Educated on...  Importance of fiber and high quality carbs and fats  3. . Agree (collaboratively): see  After visit summary. Agrees to increase fiber thereby increasing her quality of food  4. Assist: Using Motivational interveiwing assisted her in identifying barrier and ways around them to better meet her stated goals.    5. Arrange follow up: appointment scheduled for 1.5 weeks per patient preference  Debera Lat, RD 01/29/2019 11:15 AM.

## 2019-02-09 ENCOUNTER — Encounter (HOSPITAL_COMMUNITY): Payer: Self-pay | Admitting: Licensed Clinical Social Worker

## 2019-02-09 ENCOUNTER — Ambulatory Visit (INDEPENDENT_AMBULATORY_CARE_PROVIDER_SITE_OTHER): Payer: Medicare Other | Admitting: Licensed Clinical Social Worker

## 2019-02-09 ENCOUNTER — Other Ambulatory Visit (HOSPITAL_COMMUNITY)
Admission: RE | Admit: 2019-02-09 | Discharge: 2019-02-09 | Disposition: A | Discharge status: Home / Self Care | Payer: Medicare Other | Source: Ambulatory Visit | Attending: Student in an Organized Health Care Education/Training Program | Admitting: Student in an Organized Health Care Education/Training Program

## 2019-02-09 ENCOUNTER — Other Ambulatory Visit: Payer: Self-pay

## 2019-02-09 ENCOUNTER — Encounter: Payer: Self-pay | Admitting: Internal Medicine

## 2019-02-09 ENCOUNTER — Ambulatory Visit: Payer: Medicare Other | Admitting: Dietician

## 2019-02-09 ENCOUNTER — Ambulatory Visit (INDEPENDENT_AMBULATORY_CARE_PROVIDER_SITE_OTHER): Payer: Medicare Other | Admitting: Internal Medicine

## 2019-02-09 VITALS — BP 110/71 | HR 83 | Temp 98.4°F | Ht 69.0 in | Wt 263.7 lb

## 2019-02-09 DIAGNOSIS — N920 Excessive and frequent menstruation with regular cycle: Secondary | ICD-10-CM | POA: Insufficient documentation

## 2019-02-09 DIAGNOSIS — F3132 Bipolar disorder, current episode depressed, moderate: Secondary | ICD-10-CM

## 2019-02-09 DIAGNOSIS — Z124 Encounter for screening for malignant neoplasm of cervix: Secondary | ICD-10-CM

## 2019-02-09 DIAGNOSIS — D259 Leiomyoma of uterus, unspecified: Secondary | ICD-10-CM

## 2019-02-09 DIAGNOSIS — Z1151 Encounter for screening for human papillomavirus (HPV): Secondary | ICD-10-CM | POA: Insufficient documentation

## 2019-02-09 MED ORDER — DROSPIRENONE-ETHINYL ESTRADIOL 3-0.02 MG PO TABS: 1.0000 | ORAL_TABLET | Freq: Every day | ORAL | 2 refills | Status: DC

## 2019-02-09 NOTE — Patient Instructions (Addendum)
Great work Engineer, maintenance!!  Eventally the goal for you is 33-34 grams of fiber a day  Goals for the next weeks(s)   Increase walking to 25 minutes a day  Keep fiber where it is at 28 grams a day.   Find food and activity habits that work for you and you can stick with for a lifetime.....  Butch Penny [phone]
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
## 2019-02-09 NOTE — Assessment & Plan Note (Addendum)
Patient has a history of heavy menses due to uterine fibroids that are associated with cramping. She follows up with GYN for this. She was offered surgery, but declined. States she went through this procedure in the past and it was very uncomfortable. She would like to avoid surgery if possible. Denies symptoms of anemia. Does not have any contraindications to OCPs. We discussed trying OCPs to help with abdominal symptoms and heavy bleeding. She is interested in this.  - Start Yaz 1 tablet QD - Return precautions discussed  - Follow up in 3 months, will obtain iron studies then
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
## 2019-02-09 NOTE — Patient Instructions (Addendum)
Ms. Dagnino,   I will call you with the results of your Pap smear within 1 week.  I also sent a prescription for oral contraception to your pharmacy.  Take 1 tablet every day.   - Dr. Frederico Hamman

## 2019-02-09 NOTE — Progress Notes (Signed)
Documentation: Start time:1401  end time: 1415  Total time: 14 minutes  1. Dietary (nutritional) assessment:. Jordan [name] shows her MyfitnessPAL records staying under her calorie goal and meeting her fiber goal of 28 grams per day.   she has not increased her walking to twice a day and feels increasing the time she walks in the morning would work better for her. She may start back working out soon also.  Jordan [name] reports a benefit in increasing her  Fiber intake is an improvement in constipation symptoms, she feels better.   Wt Readings from Last 5 Encounters:  02/09/19 263 lb 11.2 oz (119.6 kg)  01/29/19 265 lb (120.2 kg)  01/21/19 260 lb (117.9 kg)  12/29/18 255 lb 3.2 oz (115.8 kg)  12/15/18 255 lb 11.2 oz (116 kg)   2.  Advise provided today:   Educated on...  Eventual goal for fiber, ways to add fiber in diet, planing new route in increase minutes of walking  3. . Agree (collaboratively): see  After visit summary. Agrees to increase fiber and increase minutes walking per day  4. Assist: Using Motivational interveiwing assisted her in identifying barriers and ways around them to better meet her stated goals.    5. Arrange follow up: appointment scheduled for 2 weeks per patient preference  Debera Lat, RD 02/09/2019 2:15 PM.

## 2019-02-10 ENCOUNTER — Encounter: Payer: Self-pay | Admitting: Internal Medicine

## 2019-02-10 NOTE — Assessment & Plan Note (Signed)
Pap smear performed today. Will call patient with results.

## 2019-02-10 NOTE — Progress Notes (Signed)
Internal Medicine Clinic Attending  Case discussed with Dr. Santos-Sanchez at the time of the visit.  We reviewed the resident's history and exam and pertinent patient test results.  I agree with the assessment, diagnosis, and plan of care documented in the resident's note.    

## 2019-02-12 LAB — CYTOLOGY - PAP
Comment: NEGATIVE
Diagnosis: NEGATIVE
High risk HPV: NEGATIVE

## 2019-02-23 ENCOUNTER — Ambulatory Visit: Payer: Medicare Other | Admitting: Dietician

## 2019-03-02 ENCOUNTER — Other Ambulatory Visit: Payer: Self-pay

## 2019-03-02 ENCOUNTER — Encounter (HOSPITAL_COMMUNITY): Payer: Self-pay | Admitting: Licensed Clinical Social Worker

## 2019-03-02 ENCOUNTER — Ambulatory Visit (INDEPENDENT_AMBULATORY_CARE_PROVIDER_SITE_OTHER): Payer: Medicare Other | Admitting: Licensed Clinical Social Worker

## 2019-03-02 DIAGNOSIS — F4310 Post-traumatic stress disorder, unspecified: Secondary | ICD-10-CM | POA: Diagnosis not present

## 2019-03-02 DIAGNOSIS — F3132 Bipolar disorder, current episode depressed, moderate: Secondary | ICD-10-CM

## 2019-03-02 NOTE — Progress Notes (Signed)
Virtual Visit via Video Note  I connected with Jordan [name] on 03/02/19 at  4:30 PM EST by a video enabled telemedicine application and verified that I am speaking with the correct person using two identifiers.    I discussed the limitations of evaluation and management by telemedicine and the availability of in person appointments. The patient expressed understanding and agreed to proceed.  Type of Therapy: Individual Therapy  Treatment Goals addressed: improve psychiatric symptoms, controlled behavior, moderate mood, and deliberate speech (improved social and work functioning and decrease impulsive behavior), improve unhelpful thought patterns, elevate mood (increased hopefulness and social interactions), interpersonal relationship skills, discuss and process trauma, learn about diagnosis, healthy coping skills  Interventions: Motivational Interviewing, CBT, Grounding and Mindfulness Techniques  Summary: Jordan [name] is a 39y.o. female who presents with Moderate mixed bipolar I disorder  Suicidal/Homicidal: No -without intent/plan  Therapist Response:  Jordan [name] met with clinician for an individual session. Jordan [name] discussed her psychiatric symptoms, her current life events and her homework. Jordan [name] sharedthat she is visiting her girlfriend in [place] now and has decided to have girlfriend and son move to [place], rather than her move to [place]. Clinician explored this plan and noted the pros and cons of this decision. Jordan [name] agreed that with all that is going on in [place], it is easier to have them come down. Clinician identified the possibility of moving away in the future. However, at this time, Jordan [name] has just secured a new job, her niece is moving in with her, and she is secure with her group of health care providers.  Clinician discussed updates with niece and noted that she really opened up about the treatment she was receiving at home. Clinician provided feedback about getting paperwork in  order for Jordan [name] to take on guardianship. Clinician also identified the possibility of getting niece counseling, as medicaid will cover all costs.    Plan: Return again in 2 weeks.  Diagnosis: Axis I: Moderate mixed bipolar I disorder      I discussed the assessment and treatment plan with the patient. The patient was provided an opportunity to ask questions and all were answered. The patient agreed with the plan and demonstrated an understanding of the instructions.   The patient was advised to call back or seek an in-person evaluation if the symptoms worsen or if the condition fails to improve as anticipated.  I provided 45 minutes of non-face-to-face time during this encounter.   Jordan Curling, LCSW

## 2019-03-11 ENCOUNTER — Other Ambulatory Visit: Payer: Self-pay

## 2019-03-11 ENCOUNTER — Other Ambulatory Visit: Payer: Self-pay | Admitting: Podiatry

## 2019-03-11 ENCOUNTER — Ambulatory Visit (INDEPENDENT_AMBULATORY_CARE_PROVIDER_SITE_OTHER): Payer: Medicare Other | Admitting: Internal Medicine

## 2019-03-11 ENCOUNTER — Encounter: Payer: Self-pay | Admitting: Internal Medicine

## 2019-03-11 VITALS — BP 99/66 | HR 64 | Temp 98.3°F | Ht 69.0 in | Wt 262.7 lb

## 2019-03-11 DIAGNOSIS — L259 Unspecified contact dermatitis, unspecified cause: Secondary | ICD-10-CM | POA: Insufficient documentation

## 2019-03-11 MED ORDER — HYDROCORTISONE 1 % EX CREA: TOPICAL_CREAM | CUTANEOUS | 0 refills | Status: DC
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
## 2019-03-11 NOTE — Assessment & Plan Note (Signed)
Patient presents with a two day history of her rash on her bilateral feet. She states that she woke up from sleep and found an erythematous, itchy rash covering the dorsal surface of her feet. Over the last two days the erythema has resolved but she continues to have itching. She has not noticed any blisters for vesicles. She is not had any systemic signs of infection including fevers or chills. She has never had anything like this before.  She has not bought any new lotions or ointments that she has put on her feet. She has not changed socks. She does not have new shoes. She is not tried new laundry detergent. She did put a heating pad on her feet the night before the rash started but she has done this several times in the past.  On physical exam there is multiple papules on the dorsum of the bilateral feet with an abrupt transition at her sock line. They're not tender to palpation. There is no ecchymosis or petechiae. There is no lower extremity swelling/edema.  A/P: - Likely contact dermatitis, unclear trigger  - Hydrocortisone 1% cream BID for symptomatic relief.

## 2019-03-11 NOTE — Patient Instructions (Addendum)
Thank you for allowing [place] to provide your care. I think you are likely suffering from a contact dermatitis. Given the improvement that has occurred over the last two days I think that it will likely resolve fairly quickly. I did send out some hydrocortisone cream that you can rub on your feet 1 to 2 times per day. Please do not use it for more than seven days. Ensure that you wash your hands after use and do not touch her face. If your symptoms do not prove after 1 to 2 weeks or recur please do not hesitate to call us.

## 2019-03-13 NOTE — Progress Notes (Signed)
Internal Medicine Clinic Attending  Case discussed with Dr. Helberg at the time of the visit.  We reviewed the resident's history and exam and pertinent patient test results.  I agree with the assessment, diagnosis, and plan of care documented in the resident's note.    

## 2019-03-18 ENCOUNTER — Encounter (HOSPITAL_COMMUNITY): Payer: Self-pay | Admitting: Licensed Clinical Social Worker

## 2019-03-18 ENCOUNTER — Ambulatory Visit (INDEPENDENT_AMBULATORY_CARE_PROVIDER_SITE_OTHER): Payer: Medicare Other | Admitting: Licensed Clinical Social Worker

## 2019-03-18 ENCOUNTER — Other Ambulatory Visit: Payer: Self-pay

## 2019-03-18 DIAGNOSIS — F3132 Bipolar disorder, current episode depressed, moderate: Secondary | ICD-10-CM | POA: Diagnosis not present

## 2019-03-18 NOTE — Progress Notes (Signed)
Virtual Visit via Video Note  I connected with Jordan [name] on 03/18/19 at  4:30 PM EST by a video enabled telemedicine application and verified that I am speaking with the correct person using two identifiers.     I discussed the limitations of evaluation and management by telemedicine and the availability of in person appointments. The patient expressed understanding and agreed to proceed.  Type of Therapy: Individual Therapy  Treatment Goals addressed:"help me cope with my mood and all of the changes I am going through"  Interventions: Motivational Interviewing, CBT, Grounding and Mindfulness Techniques  Summary: Jordan [name] is a 40y.o. female who presents with Moderate mixed bipolar I disorder  Suicidal/Homicidal: No -without intent/plan  Therapist Response:  [name] met with clinician for an individual session. Jordan [name] discussed her psychiatric symptoms, her current life events and her homework. 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 She reported some challenges with paperwork and making sure there is enough coverage for 1:1. Clinician explored updates on the trip to [facility] and her interactions with family. Jordan [name] processed awkwardness meeting her girlfriend's ex, as well as some challenges in now becoming the guardian of her 3 year old niece. Clinician utilized MI OARS to reflect and summarize all the changes and new responsibilities put on Jordan [name] at once. Clinician explored coping skills and noted increased use of daily meditation and bible study. Clinician explored ways to prioritize her list of goals (work, help niece, and get healthy). Clinician utilized reality testing and noted that not all of those things can happen at the same time, but that they can be done daily.  Clinician provided supportive feedback about the value of her work with her niece and how this will really have long term positive  effects on her life.  Clinician will also research referrals for OPT for niece. Updated treatment plan.     Plan: Return again in 2 weeks. Begin weekly sessions in March if needed.   Diagnosis: Axis I: Moderate mixed bipolar I disorder     I discussed the assessment and treatment plan with the patient. The patient was provided an opportunity to ask questions and all were answered. The patient agreed with the plan and demonstrated an understanding of the instructions.   The patient was advised to call back or seek an in-person evaluation if the symptoms worsen or if the condition fails to improve as anticipated.  I provided 45 minutes of non-face-to-face time during this encounter.   [name], LCSW

## 2019-03-19 ENCOUNTER — Encounter: Payer: Self-pay | Admitting: Internal Medicine

## 2019-03-19 ENCOUNTER — Ambulatory Visit (INDEPENDENT_AMBULATORY_CARE_PROVIDER_SITE_OTHER): Payer: Medicare Other | Admitting: Internal Medicine

## 2019-03-19 ENCOUNTER — Ambulatory Visit: Payer: Medicare Other | Admitting: Dietician

## 2019-03-19 ENCOUNTER — Other Ambulatory Visit (HOSPITAL_COMMUNITY)
Admission: RE | Admit: 2019-03-19 | Discharge: 2019-03-19 | Disposition: A | Discharge status: Home / Self Care | Payer: Medicare Other | Source: Ambulatory Visit | Attending: Internal Medicine | Admitting: Internal Medicine

## 2019-03-19 VITALS — BP 109/75 | HR 68 | Temp 98.2°F | Ht 69.0 in | Wt 262.7 lb

## 2019-03-19 DIAGNOSIS — N72 Inflammatory disease of cervix uteri: Secondary | ICD-10-CM

## 2019-03-19 DIAGNOSIS — R300 Dysuria: Secondary | ICD-10-CM | POA: Diagnosis not present

## 2019-03-19 DIAGNOSIS — B9689 Other specified bacterial agents as the cause of diseases classified elsewhere: Secondary | ICD-10-CM | POA: Diagnosis not present

## 2019-03-19 DIAGNOSIS — N898 Other specified noninflammatory disorders of vagina: Secondary | ICD-10-CM

## 2019-03-19 DIAGNOSIS — D259 Leiomyoma of uterus, unspecified: Secondary | ICD-10-CM | POA: Diagnosis not present

## 2019-03-19 DIAGNOSIS — M797 Fibromyalgia: Secondary | ICD-10-CM

## 2019-03-19 DIAGNOSIS — N812 Incomplete uterovaginal prolapse: Secondary | ICD-10-CM | POA: Diagnosis not present

## 2019-03-19 DIAGNOSIS — Z8742 Personal history of other diseases of the female genital tract: Secondary | ICD-10-CM

## 2019-03-19 HISTORY — DX: Inflammatory disease of cervix uteri: N72

## 2019-03-19 MED ORDER — PREGABALIN 150 MG PO CAPS: 150.0000 mg | ORAL_CAPSULE | Freq: Two times a day (BID) | ORAL | 0 refills | Status: DC

## 2019-03-19 NOTE — Patient Instructions (Addendum)
Thank you for allowing [place] to provide your care today. Today we discussed your vaginal discharge.   I will call with your results from your pelvic exam.    Please call the internal medicine center clinic if you have any questions or concerns, we may be able to help and keep you from a long and expensive emergency room wait. Our clinic and after hours phone number is [phone], the best time to call is Monday through Friday 9 am to 4 pm but there is always someone available 24/7 if you have an emergency. If you need medication refills please notify your pharmacy one week in advance and they will send [place] a request.

## 2019-03-19 NOTE — Progress Notes (Signed)
Documentation: Start time: 3:20  End time: 3:50 Total time: 30 minutes  1. Dietary (nutritional) assessment: Jordan [name] stated she had many difficulties over the holidays, including back problems which greatly limited her movement and suddenly having to care for a family member. She said she was unable to prepare food for herself, and the foods other people were bringing for her were not as healthy as ones she would usually make, leading to issues like constipation. Recently she has been doing better overall and able to return to what she was eating and doing before her back problems began over the holidays. She has a doctor's visit this coming Monday where they will advise her on what activity level she can pursue, and she is beginning to find common ground with the family member she is caring for about healthy foods they can both enjoy.   Wt Readings from Last 5 Encounters:  03/19/19 262 lb 11.2 oz (119.2 kg)  03/11/19 262 lb 11.2 oz (119.2 kg)  02/09/19 263 lb 11.2 oz (119.6 kg)  01/29/19 265 lb (120.2 kg)  01/21/19 260 lb (117.9 kg)   2. Advice provided today: Educated on... Resuming diet and exercise goals safely with new life changes: involving family member with making healthy lifestyle choices, ways to move that her doctor will allow.  3. Agree (collaboratively):  Agrees that there are ways to continue pursuing her goals despite new challenges.  4. Assist: She will send My[employer]itness Pal summaries once a week for [place] to assist with accountability.  5. Arrange follow up: Appointment scheduled for 4 weeks per patient request.   Terrence Dupont - Dietetic Intern

## 2019-03-19 NOTE — Patient Instructions (Signed)
Shoot me an email one day a week of your food records to help accountability  Next appointment is 415 on march 2nd- (not 315 as the printout says)  Butch Penny [phone]

## 2019-03-19 NOTE — Assessment & Plan Note (Addendum)
She has had clear vaginal discharge for the past week with "different smell". She has had bacterial vaginosis and yeast infections in the past and this does not seem like a yeast infection to her. She denies vaginal pain. She does have sensation of needing to continuing to urinate after finishing but denies dysuria. She is sexually active but in a long distance relationship and has not been sexually active for five months. They are monogamous. She had a pap smear one month ago with gyn and was put on OCPs and has four days left from the first pack. She denies fever, chills, and does have a mild amount of lower abdominal pain.   On exam patient has white non-odorous discharge with inflamed cervix. She also has stage II uterine prolapse. She states this was not mentioned by her gyn, but they did want to do surgery for her fibroids and she opted for oral contraceptive treatment first. Pap smear cytology with transformation zone present and normal cytology.   - UA  - testing for G/C chlamydia, trichomonas, BV, candida  - f/u after testing results - request records from obgyn office   ADDENDUM: Positive for BV and candida. Sent in fluconazole for 3 doses and metronidazole. F/u if symptoms do not improve.
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
## 2019-03-20 ENCOUNTER — Telehealth: Payer: Self-pay | Admitting: *Deleted

## 2019-03-20 LAB — MICROSCOPIC EXAMINATION: Casts: NONE SEEN /lpf

## 2019-03-20 LAB — CERVICOVAGINAL ANCILLARY ONLY
Bacterial Vaginitis (gardnerella): POSITIVE — AB
Candida Glabrata: NEGATIVE
Candida Vaginitis: POSITIVE — AB
Chlamydia: NEGATIVE
Comment: NEGATIVE
Comment: NEGATIVE
Comment: NEGATIVE
Comment: NEGATIVE
Comment: NEGATIVE
Comment: NORMAL
Neisseria Gonorrhea: NEGATIVE
Trichomonas: NEGATIVE

## 2019-03-20 LAB — URINALYSIS, ROUTINE W REFLEX MICROSCOPIC
Bilirubin, UA: NEGATIVE
Glucose, UA: NEGATIVE
Ketones, UA: NEGATIVE
Nitrite, UA: NEGATIVE
Protein,UA: NEGATIVE
RBC, UA: NEGATIVE
Specific Gravity, UA: <=1.005 — AB (ref 1.005–1.030)
Urobilinogen, Ur: 0.2 mg/dL (ref 0.2–1.0)
pH, UA: 7 (ref 5.0–7.5)

## 2019-03-20 NOTE — Progress Notes (Signed)
I reviewed and approve this note and the plan. I saw Ms. Brougher with Terrence Dupont. She said she has been stretching and requested back exercises that she thinks she can do gently.  Debera Lat, RD 03/20/2019 2:46 PM.

## 2019-03-20 NOTE — Telephone Encounter (Signed)
Pt would like test results from visit 1/28

## 2019-03-21 ENCOUNTER — Other Ambulatory Visit: Payer: Self-pay | Admitting: Internal Medicine

## 2019-03-21 DIAGNOSIS — B373 Candidiasis of vulva and vagina: Secondary | ICD-10-CM

## 2019-03-21 DIAGNOSIS — N72 Inflammatory disease of cervix uteri: Secondary | ICD-10-CM

## 2019-03-21 DIAGNOSIS — N760 Acute vaginitis: Secondary | ICD-10-CM

## 2019-03-21 DIAGNOSIS — B3731 Acute candidiasis of vulva and vagina: Secondary | ICD-10-CM

## 2019-03-21 DIAGNOSIS — B9689 Other specified bacterial agents as the cause of diseases classified elsewhere: Secondary | ICD-10-CM

## 2019-03-21 MED ORDER — METRONIDAZOLE 500 MG PO TABS: 500.0000 mg | ORAL_TABLET | Freq: Two times a day (BID) | ORAL | 0 refills | Status: AC

## 2019-03-21 MED ORDER — FLUCONAZOLE 150 MG PO TABS: 150.0000 mg | ORAL_TABLET | ORAL | 0 refills | Status: DC

## 2019-03-23 ENCOUNTER — Telehealth: Payer: Self-pay | Admitting: *Deleted

## 2019-03-23 DIAGNOSIS — M545 Low back pain: Secondary | ICD-10-CM | POA: Diagnosis not present

## 2019-03-23 DIAGNOSIS — M5416 Radiculopathy, lumbar region: Secondary | ICD-10-CM | POA: Diagnosis not present

## 2019-03-23 NOTE — Telephone Encounter (Signed)
Called patient. Results were discussed with her already and medication sent to her pharmacy.

## 2019-03-23 NOTE — Telephone Encounter (Signed)
SPOKE WITH PATIENT/ SHE IS GOING TO CONTACT HER OB/ GYN OFFICE TO REQUEST RECORDS TO BE FAXED TO [facility]. WAITING CALL BACK.

## 2019-03-26 ENCOUNTER — Encounter: Payer: Self-pay | Admitting: Internal Medicine

## 2019-03-26 NOTE — Progress Notes (Signed)
Internal Medicine Clinic Attending  Case discussed with Dr. Seawell at the time of the visit.  We reviewed the resident's history and exam and pertinent patient test results.  I agree with the assessment, diagnosis, and plan of care documented in the resident's note.    

## 2019-03-31 ENCOUNTER — Encounter: Payer: Self-pay | Admitting: Internal Medicine

## 2019-04-01 ENCOUNTER — Ambulatory Visit (INDEPENDENT_AMBULATORY_CARE_PROVIDER_SITE_OTHER): Payer: Medicare Other | Admitting: Licensed Clinical Social Worker

## 2019-04-01 ENCOUNTER — Other Ambulatory Visit: Payer: Self-pay

## 2019-04-01 ENCOUNTER — Encounter (HOSPITAL_COMMUNITY): Payer: Self-pay | Admitting: Licensed Clinical Social Worker

## 2019-04-01 DIAGNOSIS — F3132 Bipolar disorder, current episode depressed, moderate: Secondary | ICD-10-CM | POA: Diagnosis not present

## 2019-04-01 NOTE — Progress Notes (Signed)
Virtual Visit via Video Note  I connected with Jordan [name] on 04/01/19 at  4:30 PM EST by a video enabled telemedicine application and verified that I am speaking with the correct person using two identifiers.    I discussed the limitations of evaluation and management by telemedicine and the availability of in person appointments. The patient expressed understanding and agreed to proceed.  Type of Therapy: Individual Therapy  Treatment Goals addressed:"help me cope with my mood and all of the changes I am going through"  Interventions: Motivational Interviewing, CBT, Grounding and Mindfulness Techniques  Summary: Jordan [name] is a 39y.o. female who presents with Moderate mixed bipolar I disorder  Suicidal/Homicidal: No -without intent/plan  Therapist Response:  Jordan [name] met with clinician for an individual session. Jordan [name] discussed her psychiatric symptoms, her current life events and her homework. Jordan [name] sharedthat she has been very tired and stressed. Clinician explored the impact of stress on physical body using MI OARS. Clinician provided supportive feedback and provided time and space for Jordan [name] to process her multiple stressors. Clinician discussed the importance of being able to protect her own emotions. Clinician utilized Mindfulness to teach a meditation of putting on armor in order to protect herself from other people's feelings. Clinician discussed the tendency to absorb other people feelings to protect them.   Clinician provided referral info for Jordan [name] to have counseling with [facility].  Plan: Return again in 2 weeks.  Diagnosis: Axis I: Moderate mixed bipolar I disorder     I discussed the assessment and treatment plan with the patient. The patient was provided an opportunity to ask questions and all were answered. The patient agreed with the plan and demonstrated an understanding of the instructions.   The patient was advised to call back or  seek an in-person evaluation if the symptoms worsen or if the condition fails to improve as anticipated.  I provided 45 minutes of non-face-to-face time during this encounter.   [name], LCSW

## 2019-04-02 ENCOUNTER — Other Ambulatory Visit: Payer: Self-pay | Admitting: Internal Medicine

## 2019-04-02 DIAGNOSIS — N72 Inflammatory disease of cervix uteri: Secondary | ICD-10-CM

## 2019-04-07 DIAGNOSIS — N888 Other specified noninflammatory disorders of cervix uteri: Secondary | ICD-10-CM | POA: Diagnosis not present

## 2019-04-15 ENCOUNTER — Encounter (HOSPITAL_COMMUNITY): Payer: Self-pay | Admitting: Licensed Clinical Social Worker

## 2019-04-15 ENCOUNTER — Other Ambulatory Visit: Payer: Self-pay

## 2019-04-15 ENCOUNTER — Ambulatory Visit (INDEPENDENT_AMBULATORY_CARE_PROVIDER_SITE_OTHER): Payer: Medicare Other | Admitting: Licensed Clinical Social Worker

## 2019-04-15 DIAGNOSIS — F3132 Bipolar disorder, current episode depressed, moderate: Secondary | ICD-10-CM

## 2019-04-15 NOTE — Progress Notes (Signed)
Virtual Visit via Video Note  I connected with Jordan [name] on 04/15/19 at  4:30 PM EST by a video enabled telemedicine application and verified that I am speaking with the correct person using two identifiers.   I discussed the limitations of evaluation and management by telemedicine and the availability of in person appointments. The patient expressed understanding and agreed to proceed.    Type of Therapy: Individual Therapy  Treatment Goals addressed:"help me cope with my mood and all of the changes I am going through"  Interventions: Motivational Interviewing, CBT, Grounding and Mindfulness Techniques  Summary: Jordan [name] is a 39y.o. female who presents with Moderate mixed bipolar I disorder  Suicidal/Homicidal: No -without intent/plan  Therapist Response:  Jordan [name] met with clinician for an individual session. Jordan [name] discussed her psychiatric symptoms, her current life events and her homework. Jordan [name] sharedthat she has been very worried and stressed about her niece, which is leading to physical toll on her body. Clinician explored concerns using CBT. Clinician provided reality testing of what is needed and what supports niece can get from [place], school, counseling, her parents. Clinician identified the negative possibilities that are possible if niece goes back home. Jordan [name] ageed that sending her home is not an option. Jordan [name] processed the sense of responsibility she feels for her niece. Clinician identified that that support in raising this child will be of utmost importance. Clinician provided feedback and ideas of how to help niece prepare to go back to school next week.   Plan: Return again in 2 weeks. Diagnosis: Axis I: Moderate mixed bipolar I disorder  I discussed the assessment and treatment plan with the patient. The patient was provided an opportunity to ask questions and all were answered. The patient agreed with the plan and demonstrated an understanding  of the instructions.   The patient was advised to call back or seek an in-person evaluation if the symptoms worsen or if the condition fails to improve as anticipated.  I provided 45 minutes of non-face-to-face time during this encounter.   [name], LCSW

## 2019-04-18 ENCOUNTER — Other Ambulatory Visit: Payer: Self-pay | Admitting: Internal Medicine

## 2019-04-20 ENCOUNTER — Other Ambulatory Visit: Payer: Self-pay

## 2019-04-20 ENCOUNTER — Ambulatory Visit (INDEPENDENT_AMBULATORY_CARE_PROVIDER_SITE_OTHER): Payer: Medicare Other | Admitting: Psychiatry

## 2019-04-20 ENCOUNTER — Encounter (HOSPITAL_COMMUNITY): Payer: Self-pay | Admitting: Psychiatry

## 2019-04-20 DIAGNOSIS — F3132 Bipolar disorder, current episode depressed, moderate: Secondary | ICD-10-CM | POA: Diagnosis not present

## 2019-04-20 DIAGNOSIS — F419 Anxiety disorder, unspecified: Secondary | ICD-10-CM | POA: Diagnosis not present

## 2019-04-20 DIAGNOSIS — F4310 Post-traumatic stress disorder, unspecified: Secondary | ICD-10-CM

## 2019-04-20 MED ORDER — HYDROXYZINE HCL 10 MG PO TABS: ORAL_TABLET | ORAL | 1 refills | Status: DC

## 2019-04-20 MED ORDER — DIVALPROEX SODIUM ER 250 MG PO TB24: 750.0000 mg | ORAL_TABLET | Freq: Every day | ORAL | 2 refills | Status: DC
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
## 2019-04-21 ENCOUNTER — Other Ambulatory Visit: Payer: Self-pay

## 2019-04-21 ENCOUNTER — Ambulatory Visit: Payer: Medicare Other | Admitting: Dietician

## 2019-04-21 DIAGNOSIS — M797 Fibromyalgia: Secondary | ICD-10-CM

## 2019-04-21 NOTE — Telephone Encounter (Signed)
pregabalin (LYRICA) 150 MG capsule      REFILL REQUEST @  [id number] [address], [place]. [phone] (Phone) [phone] (Fax)

## 2019-04-22 MED ORDER — PREGABALIN 150 MG PO CAPS: 150.0000 mg | ORAL_CAPSULE | Freq: Two times a day (BID) | ORAL | 5 refills | Status: DC

## 2019-04-23 ENCOUNTER — Encounter (HOSPITAL_COMMUNITY): Payer: Self-pay | Admitting: Licensed Clinical Social Worker

## 2019-04-23 ENCOUNTER — Other Ambulatory Visit: Payer: Self-pay

## 2019-04-23 ENCOUNTER — Ambulatory Visit (INDEPENDENT_AMBULATORY_CARE_PROVIDER_SITE_OTHER): Payer: Medicare Other | Admitting: Licensed Clinical Social Worker

## 2019-04-23 DIAGNOSIS — F3132 Bipolar disorder, current episode depressed, moderate: Secondary | ICD-10-CM

## 2019-04-23 NOTE — Progress Notes (Signed)
Virtual Visit via Video Note  I connected with Jordan [name] on 04/23/19 at  4:30 PM EST by a video enabled telemedicine application and verified that I am speaking with the correct person using two identifiers.     I discussed the limitations of evaluation and management by telemedicine and the availability of in person appointments. The patient expressed understanding and agreed to proceed.  Type of Therapy: Individual Therapy  Treatment Goals addressed:"help me cope with my mood and all of the changes I am going through"  Interventions: Motivational Interviewing, EFT Tapping  Summary: Jordan [name] is a 39y.o. female who presents with Moderate mixed bipolar I disorder  Suicidal/Homicidal: No -without intent/plan  Therapist Response:  Jordan [name] met with Jordan [name] for an individual session. Jordan [name] discussed her psychiatric symptoms, her current life events and her homework. Jordan [name] shared that she has been really angry since her niece has been here and she continues to worry about her own emotional and physical health if her niece stays. Jordan [name] utilized MI OARS to reflect and summarize thoughts and feelings about the situation. Jordan [name] processed through new information about the relationship between [name] and niece. Jordan [name] provided psychoeducation about how the family has influenced the person she has become and noted how this triggers Jordan [name]. Jordan [name] identified ways to create boundaries, set limits, and identified that there is only so much that Jordan [name] can change for this child. Jordan [name] taught EFT tapping to assist with stress relief and anxiety relief.    Plan: Return again in 1-2 weeks. Diagnosis: Axis I: Moderate mixed bipolar I disorder    I discussed the assessment and treatment plan with the patient. The patient was provided an opportunity to ask questions and all were answered. The patient agreed with the plan and demonstrated an understanding of the  instructions.   The patient was advised to call back or seek an in-person evaluation if the symptoms worsen or if the condition fails to improve as anticipated.  I provided 45 minutes of non-face-to-face time during this encounter.   [name], LCSW

## 2019-04-30 ENCOUNTER — Other Ambulatory Visit: Payer: Self-pay

## 2019-04-30 ENCOUNTER — Ambulatory Visit (INDEPENDENT_AMBULATORY_CARE_PROVIDER_SITE_OTHER): Payer: Medicare Other | Admitting: Licensed Clinical Social Worker

## 2019-04-30 ENCOUNTER — Encounter (HOSPITAL_COMMUNITY): Payer: Self-pay | Admitting: Licensed Clinical Social Worker

## 2019-04-30 DIAGNOSIS — F3132 Bipolar disorder, current episode depressed, moderate: Secondary | ICD-10-CM

## 2019-04-30 NOTE — Progress Notes (Signed)
Virtual Visit via Video Note  I connected with [name] on 04/30/19 at  4:30 PM EST by a video enabled telemedicine application and verified that I am speaking with the correct person using two identifiers.     I discussed the limitations of evaluation and management by telemedicine and the availability of in person appointments. The patient expressed understanding and agreed to proceed.  Type of Therapy: Individual Therapy  Treatment Goals addressed:"help me cope with my mood and all of the changes I am going through"  Interventions: CBT, EFT Tapping  Summary: Jordan [name] is a 39y.o. female who presents with Moderate mixed bipolar I disorder  Suicidal/Homicidal: No -without intent/plan  Therapist Response:  Eulogia met with clinician for an individual session. Mallorey discussed her psychiatric symptoms, her current life events and her homework. Kriste shared that she continues to feel stressed, depressed, and angry. Clinician explored triggers using CBT. Clinician identified that there have been changes in her living situation and she no longer has her niece 24-7, but she continues to help with school and transportation. Clinician also processed through challenges with her benefits, as she is working more. Clinician processed through options using CBT Pros and Cons. Clinician encouraged Mahreen to seek support and advice from her aunt, who has been a huge support to her and who knows about how this works. Clinician discussed coping skills and rehearsed EFT tapping again in order to address anxiety   Plan: Return again in 1-2 weeks. Diagnosis: Axis I: Moderate mixed bipolar I disorder    I discussed the assessment and treatment plan with the patient. The patient was provided an opportunity to ask questions and all were answered. The patient agreed with the plan and demonstrated an understanding of the instructions.   The patient was advised to call back or seek an  in-person evaluation if the symptoms worsen or if the condition fails to improve as anticipated.  I provided 45 minutes of non-face-to-face time during this encounter.   [name], LCSW

## 2019-05-07 ENCOUNTER — Other Ambulatory Visit: Payer: Self-pay

## 2019-05-07 ENCOUNTER — Encounter (HOSPITAL_COMMUNITY): Payer: Self-pay | Admitting: Licensed Clinical Social Worker

## 2019-05-07 ENCOUNTER — Ambulatory Visit (INDEPENDENT_AMBULATORY_CARE_PROVIDER_SITE_OTHER): Payer: Medicare Other | Admitting: Licensed Clinical Social Worker

## 2019-05-07 DIAGNOSIS — F3132 Bipolar disorder, current episode depressed, moderate: Secondary | ICD-10-CM | POA: Diagnosis not present

## 2019-05-07 NOTE — Progress Notes (Signed)
Virtual Visit via Video Note  I connected with Jordan [name] on 05/07/19 at  4:30 PM EDT by a video enabled telemedicine application and verified that I am speaking with the correct person using two identifiers.     I discussed the limitations of evaluation and management by telemedicine and the availability of in person appointments. The patient expressed understanding and agreed to proceed.  Type of Therapy: Individual Therapy   Treatment Goals addressed:"help me cope with my mood and all of the changes I am going through"   Interventions: Motivational interviewing   Summary: Jordan [name] is a 39 y.o. female who presents with Moderate mixed bipolar I disorder   Suicidal/Homicidal: No -without intent/plan   Therapist Response:   Jordan [name] met with clinician for an individual session. Jordan [name] discussed her psychiatric symptoms, her current life events and her homework. Jordan [name] shared that she feels worn down and tired. Clinician explored updates in her care of her niece and interactions with sister and brother in law. Clinician utilized MI OARS to reflect and affirm feelings and thoughts about responsibility, adulthood, and caring for someone else's child. Clinician validated Jordan [name]'s sadness that she is in this situation, but notes that she will continue to work with her niece so she can get her education. Jordan [name] processed concerns about her girlfriend's son. Clinician provided feedback about developmental expectations and processed some bxs that are present, which concern Jordan [name]. Clinician provided feedback about parenting support for children that may have Autism, such as keeping routine and structure.    Plan: Return again in 1-2 weeks.    Diagnosis: Axis I: Moderate mixed bipolar I disorder        I discussed the assessment and treatment plan with the patient. The patient was provided an opportunity to ask questions and all were answered. The patient agreed with the plan and  demonstrated an understanding of the instructions.   The patient was advised to call back or seek an in-person evaluation if the symptoms worsen or if the condition fails to improve as anticipated.  I provided 40 minutes of non-face-to-face time during this encounter.   [name], LCSW

## 2019-05-14 ENCOUNTER — Ambulatory Visit (INDEPENDENT_AMBULATORY_CARE_PROVIDER_SITE_OTHER): Payer: Medicare Other | Admitting: Licensed Clinical Social Worker

## 2019-05-14 ENCOUNTER — Encounter (HOSPITAL_COMMUNITY): Payer: Self-pay | Admitting: Licensed Clinical Social Worker

## 2019-05-14 ENCOUNTER — Other Ambulatory Visit: Payer: Self-pay

## 2019-05-14 DIAGNOSIS — F3132 Bipolar disorder, current episode depressed, moderate: Secondary | ICD-10-CM | POA: Diagnosis not present

## 2019-05-14 NOTE — Progress Notes (Signed)
Virtual Visit via Video Note  I connected with Jordan [name] on 05/14/19 at  4:30 PM EDT by a video enabled telemedicine application and verified that I am speaking with the correct person using two identifiers.     I discussed the limitations of evaluation and management by telemedicine and the availability of in person appointments. The patient expressed understanding and agreed to proceed.  Type of Therapy: Individual Therapy   Treatment Goals addressed:"help me cope with my mood and all of the changes I am going through"   Interventions: Motivational interviewing   Summary: Jordan [name] is a 39 y.o. female who presents with Moderate mixed bipolar I disorder   Suicidal/Homicidal: No -without intent/plan   Therapist Response:   Lania met with clinician for an individual session. Jordan [name] discussed her psychiatric symptoms, her current life events and her homework. Jordan [name] shared that she continues to feel very stressed due to negative interactions with her sister, concerns over the health of her dog, her own health concerns, and taking care of her niece. Clinician utilized MI OARS to reflect and summarize these concerns and noted the physical toll the high stress levels are taking on her. Clinician explored ways to increase self care, and reminded Jordan [name] that she cannot control these other adults, only her reaction to them. Clinician reflected sadness over her dog, who now has congestive heart failure. Clinician processed feelings and validated her concern and need to be present. Clinician also noted the challenge of dealing with her sister, capitalizing on her devotion to caring for her niece.    Plan: Return again in 1-2 weeks.     Diagnosis: Axis I: Moderate mixed bipolar I disorder    I discussed the assessment and treatment plan with the patient. The patient was provided an opportunity to ask questions and all were answered. The patient agreed with the plan and demonstrated an  understanding of the instructions.   The patient was advised to call back or seek an in-person evaluation if the symptoms worsen or if the condition fails to improve as anticipated.  I provided 45 minutes of non-face-to-face time during this encounter.   Jordan Curling, LCSW

## 2019-05-20 ENCOUNTER — Encounter: Payer: Self-pay | Admitting: Dietician

## 2019-05-20 ENCOUNTER — Ambulatory Visit: Payer: Medicare Other | Admitting: Dietician

## 2019-05-20 NOTE — Patient Instructions (Signed)
I'll check in on [employer] weekly to see how you are doing.   Journal emotions, pay attention to how I am feeling- triggers, take a time out.    See you in 4 weeks!   Butch Penny [phone]

## 2019-05-20 NOTE — Progress Notes (Signed)
Documentation:   1. Dietary (nutritional) assessment: Jordan [name] states that stress eating is a problem for her and is a barrier to her success in making healthy food choices.   Wt Readings from Last 5 Encounters:  05/20/19 261 lb 4.8 oz (118.5 kg)  03/19/19 262 lb 11.2 oz (119.2 kg)  03/11/19 262 lb 11.2 oz (119.2 kg)  02/09/19 263 lb 11.2 oz (119.6 kg)  01/29/19 265 lb (120.2 kg)   2. Advice provided today: Educated on...methods of breaking the chain of events during times of stress eating,   3. Agree (collaboratively):  Agrees that stress" eating and anxiety are barriers to her achieving her goals.  4. Assist: She will record in [employer]. RDN to check in weekly.  5. Arrange follow up: Appointment scheduled for 4 weeks per patient request.   Debera Lat, RD 05/20/2019 10:29 AM.

## 2019-05-22 ENCOUNTER — Other Ambulatory Visit: Payer: Self-pay | Admitting: Podiatry

## 2019-05-26 ENCOUNTER — Encounter (HOSPITAL_COMMUNITY): Payer: Self-pay | Admitting: Licensed Clinical Social Worker

## 2019-05-26 ENCOUNTER — Other Ambulatory Visit: Payer: Self-pay

## 2019-05-26 ENCOUNTER — Ambulatory Visit (INDEPENDENT_AMBULATORY_CARE_PROVIDER_SITE_OTHER): Payer: Medicare Other | Admitting: Licensed Clinical Social Worker

## 2019-05-26 DIAGNOSIS — F3132 Bipolar disorder, current episode depressed, moderate: Secondary | ICD-10-CM | POA: Diagnosis not present

## 2019-05-26 NOTE — Progress Notes (Signed)
Virtual Visit via Video Note  I connected with NATHALI VENT on 05/26/19 at  4:30 PM EDT by a video enabled telemedicine application and verified that I am speaking with the correct person using two identifiers.    I discussed the limitations of evaluation and management by telemedicine and the availability of in person appointments. The patient expressed understanding and agreed to proceed.  Type of Therapy: Individual Therapy   Treatment Goals addressed:"help me cope with my mood and all of the changes I am going through"   Interventions: CBT   Summary: SERENITI WAN is a 39 y.o. female who presents with Moderate mixed bipolar I disorder   Suicidal/Homicidal: No -without intent/plan   Therapist Response:   Glenn met with clinician for an individual session. Clariza discussed her psychiatric symptoms, her current life events and her homework. Zyasia shared that she has a lot of stress on her currently, which is making her feel more emotional, weepy, and depressed. Clinician explored the stressors, current coping skills, and ways to alleviate some of these external stressors in order to live a happier life. Clinician utilized CBT to challenge some of her automatic thoughts, particularly the magical thinking that her family would just change overnight. Clinician also identified the personal strengths that Sueann has, which has allowed and encouraged her and her nieces to get away from the toxic environment of the family home. Clinician processed ways to reduce emotional eating and to use medication and self care as a more effective tool. Clinician also provided information about [employer] and documentation on how to gain custody of her niece.    Plan: Return again in 1-2 weeks.     Diagnosis: Axis I: Moderate mixed bipolar I disorder        I discussed the assessment and treatment plan with the patient. The patient was provided an opportunity to ask questions and all were answered.  The patient agreed with the plan and demonstrated an understanding of the instructions.   The patient was advised to call back or seek an in-person evaluation if the symptoms worsen or if the condition fails to improve as anticipated.  I provided 45 minutes of non-face-to-face time during this encounter.   [name], LCSW

## 2019-05-29 DIAGNOSIS — M952 Other acquired deformity of head: Secondary | ICD-10-CM | POA: Diagnosis not present

## 2019-05-29 DIAGNOSIS — K090 Developmental odontogenic cysts: Secondary | ICD-10-CM | POA: Diagnosis not present

## 2019-06-02 ENCOUNTER — Encounter (HOSPITAL_COMMUNITY): Payer: Self-pay | Admitting: Licensed Clinical Social Worker

## 2019-06-02 ENCOUNTER — Other Ambulatory Visit: Payer: Self-pay

## 2019-06-02 ENCOUNTER — Ambulatory Visit (INDEPENDENT_AMBULATORY_CARE_PROVIDER_SITE_OTHER): Payer: Medicare Other | Admitting: Licensed Clinical Social Worker

## 2019-06-02 DIAGNOSIS — F3132 Bipolar disorder, current episode depressed, moderate: Secondary | ICD-10-CM | POA: Diagnosis not present

## 2019-06-02 NOTE — Progress Notes (Signed)
Virtual Visit via Video Note  I connected with Jordan [name] on 06/02/19 at  4:30 PM EDT by a video enabled telemedicine application and verified that I am speaking with the correct person using two identifiers.     I discussed the limitations of evaluation and management by telemedicine and the availability of in person appointments. The patient expressed understanding and agreed to proceed.  Type of Therapy: Individual Therapy   Treatment Goals addressed:"help me cope with my mood and all of the changes I am going through"   Interventions: CBT   Summary: Jordan [name] is a 39 y.o. female who presents with Moderate mixed bipolar I disorder   Suicidal/Homicidal: No -without intent/plan   Therapist Response:   Jordan [name] met with clinician for an individual session. Jordan [name] discussed her psychiatric symptoms, her current life events and her homework. Jordan [name] reports that she has been in a lot of physical pain lately and will be going to the doctor tomorrow for her feet. Clinician utilized CBT and processed through pain as a trigger for her irritability and anger. Clinician explored relationships with family members and girlfriend. Clinician discussed options and processed movement toward getting guardianship of niece. Clinician encouraged Jordan [name] to take care of herself and to wait until next week to contact legal aid. Clinician also identified the importance of communicating with niece about her pain and what is expected or needed each day.    Plan: Return again in 1-2 weeks.     Diagnosis: Axis I: Moderate mixed bipolar I disorder       I discussed the assessment and treatment plan with the patient. The patient was provided an opportunity to ask questions and all were answered. The patient agreed with the plan and demonstrated an understanding of the instructions.   The patient was advised to call back or seek an in-person evaluation if the symptoms worsen or if the condition fails to  improve as anticipated.  I provided 50 minutes of non-face-to-face time during this encounter.   [name], LCSW

## 2019-06-03 ENCOUNTER — Other Ambulatory Visit: Payer: Self-pay

## 2019-06-03 ENCOUNTER — Ambulatory Visit (INDEPENDENT_AMBULATORY_CARE_PROVIDER_SITE_OTHER): Payer: Medicare Other

## 2019-06-03 ENCOUNTER — Ambulatory Visit (INDEPENDENT_AMBULATORY_CARE_PROVIDER_SITE_OTHER): Payer: Medicare Other | Admitting: Podiatry

## 2019-06-03 ENCOUNTER — Other Ambulatory Visit: Payer: Self-pay | Admitting: Podiatry

## 2019-06-03 ENCOUNTER — Encounter: Payer: Self-pay | Admitting: Podiatry

## 2019-06-03 VITALS — Temp 97.9°F

## 2019-06-03 DIAGNOSIS — M722 Plantar fascial fibromatosis: Secondary | ICD-10-CM

## 2019-06-03 DIAGNOSIS — M7751 Other enthesopathy of right foot: Secondary | ICD-10-CM | POA: Diagnosis not present

## 2019-06-03 DIAGNOSIS — M79671 Pain in right foot: Secondary | ICD-10-CM

## 2019-06-04 NOTE — Progress Notes (Signed)
Subjective:   Patient ID: Jordan [name], female   DOB: 39 y.o.   MRN: [id number]   HPI Patient presents with pain of her plantar right first metatarsal and states that it is been inflamed and it is worse when walking and trying to exercise   ROS      Objective:  Physical Exam  Neurovascular status intact with inflammation pain of the sesamoidal complex right with fluid buildup with palpation of the area     Assessment:  Acute sesamoiditis right with inflammation     Plan:  H&P condition reviewed sterile prep done and I injected the plantar sesamoidal complex 3 mg Dexasone Kenalog 5 mg Xylocaine and applied dancers pad to reduce all pressure against the plantar foot.  Advised on rigid shoes reappoint as needed  X-rays indicated bipartite sesamoid right with no change from previous visit that it was compared to

## 2019-06-09 ENCOUNTER — Encounter (HOSPITAL_COMMUNITY): Payer: Self-pay | Admitting: Licensed Clinical Social Worker

## 2019-06-09 ENCOUNTER — Other Ambulatory Visit: Payer: Self-pay

## 2019-06-09 ENCOUNTER — Ambulatory Visit (INDEPENDENT_AMBULATORY_CARE_PROVIDER_SITE_OTHER): Payer: Medicare Other | Admitting: Licensed Clinical Social Worker

## 2019-06-09 DIAGNOSIS — F3132 Bipolar disorder, current episode depressed, moderate: Secondary | ICD-10-CM

## 2019-06-09 NOTE — Progress Notes (Signed)
Virtual Visit via Video Note  I connected with Jordan [name] on 06/09/19 at  4:30 PM EDT by a video enabled telemedicine application and verified that I am speaking with the correct person using two identifiers.    I discussed the limitations of evaluation and management by telemedicine and the availability of in person appointments. The patient expressed understanding and agreed to proceed.  Type of Therapy: Individual Therapy   Treatment Goals addressed:"help me cope with my mood and all of the changes I am going through"   Interventions: CBT   Summary: Jordan [name] is a 39 y.o. female who presents with Moderate mixed bipolar I disorder   Suicidal/Homicidal: No -without intent/plan   Therapist Response:   Lindora met with clinician for an individual session. Jordan [name] discussed her psychiatric symptoms, her current life events and her homework. Jordan [name] continues to have daily pain and noted that today was a particularly bad day. She was in bed much of the session and noted that she just feels like quitting. Clinician processed thoughts and feelings using CBT. Clinician explored options for getting some rest, getting away, and having time to herself. Clinician discussed challenges present outside of physical health and noted caring for niece, car problems, and finances as stressors. Clinician discussed coping skills and ways to get some problems solved. Clinician discussed thoughts about moving and encouraged Jordan [name] to reach out to Legal Aid for support about getting full guardianship of niece. Jordan [name] identified that this would likely be the end of her relationship with mom and sister. Clinician explored thoughts about what would be harder, having them in her life, or having them out of her life.     Plan: Return again in 1-2 weeks.     Diagnosis: Axis I: Moderate mixed bipolar I disorder       I discussed the assessment and treatment plan with the patient. The patient was provided an  opportunity to ask questions and all were answered. The patient agreed with the plan and demonstrated an understanding of the instructions.   The patient was advised to call back or seek an in-person evaluation if the symptoms worsen or if the condition fails to improve as anticipated.  I provided 45 minutes of non-face-to-face time during this encounter.   Jordan Curling, LCSW

## 2019-06-15 ENCOUNTER — Encounter: Payer: Self-pay | Admitting: *Deleted

## 2019-06-15 ENCOUNTER — Ambulatory Visit (HOSPITAL_COMMUNITY): Payer: Medicare Other | Admitting: Licensed Clinical Social Worker

## 2019-06-15 ENCOUNTER — Other Ambulatory Visit: Payer: Self-pay

## 2019-06-18 ENCOUNTER — Ambulatory Visit: Payer: Medicare Other | Admitting: Dietician

## 2019-06-22 ENCOUNTER — Encounter (HOSPITAL_COMMUNITY): Payer: Self-pay | Admitting: Licensed Clinical Social Worker

## 2019-06-22 ENCOUNTER — Other Ambulatory Visit: Payer: Self-pay

## 2019-06-22 ENCOUNTER — Ambulatory Visit (INDEPENDENT_AMBULATORY_CARE_PROVIDER_SITE_OTHER): Payer: Medicare Other | Admitting: Licensed Clinical Social Worker

## 2019-06-22 DIAGNOSIS — F3162 Bipolar disorder, current episode mixed, moderate: Secondary | ICD-10-CM | POA: Diagnosis not present

## 2019-06-22 NOTE — Progress Notes (Addendum)
Virtual Visit via Video Note  I connected with Jordan [name] on 06/22/19 at 10:00 AM EDT by a video enabled telemedicine application and verified that I am speaking with the correct person using two identifiers.  Location: Patient: home Provider: office   I discussed the limitations of evaluation and management by telemedicine and the availability of in person appointments. The patient expressed understanding and agreed to proceed.  Type of Therapy: Individual Therapy   Treatment Goals addressed: "help me cope with my mood and all of the changes I am going through"   Interventions: CBT   Summary: [name] is a 39 y.o. female who presents with Moderate mixed bipolar I disorder   Suicidal/Homicidal: No -without intent/plan   Therapist Response:   Adriyana met with clinician for an individual session. Noely discussed her psychiatric symptoms, her current life events and her homework. Cherie processed current moods and interactions with family and friends. Clinician processed thoughts and feelings triggered by these interactions. Clinician utilized CBT to explore reasons and coping skills for the reactions she felt and experienced. Clinician processed issues with girlfriend, explored possible jealousy or feelings of discomfort about what is happening in that area. Clinician also processed changes and updates about caring for niece, noting significant progress for niece in school and attitude, but also identified reality of still being "mom" to someone else's child.    Plan: Return again in 1-2 weeks.     Diagnosis: Axis I: Moderate mixed bipolar I disorder     I discussed the assessment and treatment plan with the patient. The patient was provided an opportunity to ask questions and all were answered. The patient agreed with the plan and demonstrated an understanding of the instructions.   The patient was advised to call back or seek an in-person evaluation if the symptoms worsen  or if the condition fails to improve as anticipated.  I provided 45 minutes of non-face-to-face time during this encounter.   [name], LCSW

## 2019-06-24 ENCOUNTER — Other Ambulatory Visit: Payer: Self-pay | Admitting: Internal Medicine

## 2019-06-24 DIAGNOSIS — M797 Fibromyalgia: Secondary | ICD-10-CM

## 2019-06-26 ENCOUNTER — Encounter: Payer: Self-pay | Admitting: Dietician

## 2019-06-26 ENCOUNTER — Ambulatory Visit: Payer: Medicare Other | Admitting: Dietician

## 2019-06-26 NOTE — Progress Notes (Signed)
Documentation:   1. Dietary (nutritional) assessment: Jordan [name] states that she will be starting a program with her niece for weight management an therefore will no longer be coming here. She expresses concern over her niece's lack of healthful choices.   Wt Readings from Last 5 Encounters:  06/26/19 261 lb 8 oz (118.6 kg)  05/20/19 261 lb 4.8 oz (118.5 kg)  03/19/19 262 lb 11.2 oz (119.2 kg)  03/11/19 262 lb 11.2 oz (119.2 kg)  02/09/19 263 lb 11.2 oz (119.6 kg)  2. Advice provided today: Educated on... division of responsibility between an adult and child/teenager. Role modeling the behavior she'd like her niece to display, ways to involve her nice in her decision making without giving up the ultimate control. Importance of support and counseling during this time 3. Agree (collaboratively):  Agrees that she is the supervising adult and has the knowledge to model healthful behavior. Has ongoing counseling scheduled for both she and her nicce. 4. Assist: none needed today. 5. Arrange follow up: to continue at weight management program with her nice.  [place], RD 06/26/2019 11:08 AM.

## 2019-07-07 ENCOUNTER — Encounter (HOSPITAL_COMMUNITY): Payer: Self-pay | Admitting: Licensed Clinical Social Worker

## 2019-07-07 ENCOUNTER — Ambulatory Visit (INDEPENDENT_AMBULATORY_CARE_PROVIDER_SITE_OTHER): Payer: Medicare Other | Admitting: Licensed Clinical Social Worker

## 2019-07-07 ENCOUNTER — Other Ambulatory Visit: Payer: Self-pay

## 2019-07-07 DIAGNOSIS — F3162 Bipolar disorder, current episode mixed, moderate: Secondary | ICD-10-CM | POA: Diagnosis not present

## 2019-07-07 NOTE — Progress Notes (Signed)
Virtual Visit via Video Note  I connected with Jordan [name] on 07/07/19 at 10:00 AM EDT by a video enabled telemedicine application and verified that I am speaking with the correct person using two identifiers.     I discussed the limitations of evaluation and management by telemedicine and the availability of in person appointments. The patient expressed understanding and agreed to proceed.  Type of Therapy: Individual Therapy   Treatment Goals addressed: "help me cope with my mood and all of the changes I am going through"   Interventions: CBT   Summary: Jordan [name] is a 39 y.o. female who presents with Moderate mixed bipolar I disorder   Suicidal/Homicidal: No -without intent/plan   Therapist Response:   Jordan [name] met with clinician for an individual session. Jordan [name] discussed her psychiatric symptoms, her current life events and her homework. Jordan [name] reported that she has been feeling more up and down lately, with triggers associated with anger, frustration, and sadness over the situation with her niece. Clinician processed thoughts and feelings using CBT. Clinician provided reality testing about her options and encouraged Jordan [name] and niece to make a Pros and Cons list about moving. Clinician discussed the importance of weighing out options and noted that there have been a lot of new findings in the situation due to niece starting therapy. Clinician normalized Jordan [name]'s concerns and thoughts about niece. Clinician also explored plans and coping skills that can be helpful. Clinician encouraged Jordan [name] to take the Hydroxyzine, especially when she sees a trigger that will be unavoidable.     Plan: Return again in 1-2 weeks.     Diagnosis: Axis I: Moderate mixed bipolar I disorder    I discussed the assessment and treatment plan with the patient. The patient was provided an opportunity to ask questions and all were answered. The patient agreed with the plan and demonstrated an understanding  of the instructions.   The patient was advised to call back or seek an in-person evaluation if the symptoms worsen or if the condition fails to improve as anticipated.  I provided 45 minutes of non-face-to-face time during this encounter.   [name], LCSW

## 2019-07-08 ENCOUNTER — Ambulatory Visit (INDEPENDENT_AMBULATORY_CARE_PROVIDER_SITE_OTHER): Payer: Medicare Other | Admitting: Family Medicine

## 2019-07-09 ENCOUNTER — Other Ambulatory Visit: Payer: Self-pay

## 2019-07-09 ENCOUNTER — Ambulatory Visit (INDEPENDENT_AMBULATORY_CARE_PROVIDER_SITE_OTHER): Payer: Medicare Other | Admitting: Family Medicine

## 2019-07-09 ENCOUNTER — Encounter (INDEPENDENT_AMBULATORY_CARE_PROVIDER_SITE_OTHER): Payer: Self-pay | Admitting: Family Medicine

## 2019-07-09 VITALS — BP 96/62 | HR 74 | Temp 98.4°F | Ht 69.0 in | Wt 252.0 lb

## 2019-07-09 DIAGNOSIS — R0602 Shortness of breath: Secondary | ICD-10-CM

## 2019-07-09 DIAGNOSIS — F3289 Other specified depressive episodes: Secondary | ICD-10-CM

## 2019-07-09 DIAGNOSIS — R5383 Other fatigue: Secondary | ICD-10-CM | POA: Diagnosis not present

## 2019-07-09 DIAGNOSIS — F319 Bipolar disorder, unspecified: Secondary | ICD-10-CM | POA: Diagnosis not present

## 2019-07-09 DIAGNOSIS — Z6837 Body mass index (BMI) 37.0-37.9, adult: Secondary | ICD-10-CM

## 2019-07-09 DIAGNOSIS — M797 Fibromyalgia: Secondary | ICD-10-CM

## 2019-07-09 DIAGNOSIS — R790 Abnormal level of blood mineral: Secondary | ICD-10-CM | POA: Diagnosis not present

## 2019-07-09 DIAGNOSIS — Z0289 Encounter for other administrative examinations: Secondary | ICD-10-CM

## 2019-07-09 NOTE — Progress Notes (Signed)
Chief Complaint:   OBESITY Jordan [name] (MR# [id number]) is a 39 y.o. female who presents for evaluation and treatment of obesity and related comorbidities. Current BMI is Body mass index is 37.21 kg/m. Jordan [name] has been struggling with her weight for many years and has been unsuccessful in either losing weight, maintaining weight loss, or reaching her healthy weight goal.  Jordan [name] is currently in the action stage of change and ready to dedicate time achieving and maintaining a healthier weight. Jordan [name] is interested in becoming our patient and working on intensive lifestyle modifications including (but not limited to) diet and exercise for weight loss.  Jordan [name] has a history of alcohol abuse at age 27-19, but she is now sober.  She is lactose intolerant.  She is taking care of her 34 year old niece.  She has had increased stress, which has led to emotional eating.  She has gained 60 pounds.  Jordan [name]'s habits were reviewed today and are as follows: Her family eats meals together, she thinks her family will eat healthier with her, her desired weight loss is 66 pounds, she started gaining weight after a back injury, her heaviest weight ever was 290 pounds, she craves chocolate and crunchy foods, she skips breakfast frequently, she is frequently drinking liquids with calories, she frequently makes poor food choices, she frequently eats larger portions than normal and she struggles with emotional eating.  Depression Screen Jordan [name]'s Food and Mood (modified PHQ-9) score was 16.  Depression screen PHQ 2/9 07/09/2019  Decreased Interest 1  Down, Depressed, Hopeless 1  PHQ - 2 Score 2  Altered sleeping 2  Tired, decreased energy 3  Change in appetite 3  Feeling bad or failure about yourself  1  Trouble concentrating 2  Moving slowly or fidgety/restless 3  Suicidal thoughts 0  PHQ-9 Score 16  Difficult doing work/chores Very difficult  Some recent data might be hidden   Subjective:   1.  Other fatigue Jordan [name] denies daytime somnolence and reports waking up still tired. Patent has a history of symptoms of morning fatigue and snoring. Jordan [name] generally gets 5 or 6 hours of sleep per night, and states that she has poor quality sleep. Snoring is present. Apneic episodes is not present. Epworth Sleepiness Score is 4.  2. SOB (shortness of breath) on exertion Jordan [name] notes increasing shortness of breath with exercising and seems to be worsening over time with weight gain. She notes getting out of breath sooner with activity than she used to. This has gotten worse recently. Jordan [name] denies shortness of breath at rest or orthopnea.  3. Fibromyalgia Jordan [name] has history of a back injury that led to disability.  She takes Lyrica 150 mg twice daily.  4. Bipolar affective disorder, PTSD She is followed by her psychiatrist, Dr. Adele Schilder.  She also sees a counselor twice a week.  5. Other depression, with emotional eating Jordan [name] is struggling with emotional eating and using food for comfort to the extent that it is negatively impacting her health. She has been working on behavior modification techniques to help reduce her emotional eating and has been unsuccessful. She shows no sign of suicidal or homicidal ideations.  PHQ-9 is 16.  Assessment/Plan:   1. Other fatigue Jordan [name] does feel that her weight is causing her energy to be lower than it should be. Fatigue may be related to obesity, depression or many other causes. Labs will be ordered, and in the meanwhile, Jordan [name] will focus on self care including making healthy food  choices, increasing physical activity and focusing on stress reduction.  Orders - EKG 12-Lead - Hemoglobin A1c - Insulin, random - VITAMIN D 25 Hydroxy (Vit-D Deficiency, Fractures) - TSH - T4, free - T3 - Anemia panel  2. SOB (shortness of breath) on exertion Rashan does feel that she gets out of breath more easily that she used to when she exercises. Dalal's shortness of  breath appears to be obesity related and exercise induced. She has agreed to work on weight loss and gradually increase exercise to treat her exercise induced shortness of breath. Will continue to monitor closely.  Orders - Comprehensive metabolic panel - CBC with Differential/Platelet - Lipid panel  3. Fibromyalgia Current treatment plan is effective, no change in therapy.  4. Bipolar affective disorder, PTSD Followed by Dr. Adele Schilder for this problem. Those encounter notes were reviewed.  5. Other depression, with emotional eating Behavior modification techniques were discussed today to help Jordan [name] deal with her emotional/non-hunger eating behaviors.  Orders and follow up as documented in patient record.   6. Class 2 severe obesity with serious comorbidity and body mass index (BMI) of 37.0 to 37.9 in adult, unspecified obesity type (HCC) Jordan [name] is currently in the action stage of change and her goal is to continue with weight loss efforts. I recommend Jordan [name] begin the structured treatment plan as follows:  She has agreed to the Category 1 Plan.  Exercise goals: No exercise has been prescribed at this time.   Behavioral modification strategies: increasing lean protein intake, decreasing simple carbohydrates, increasing vegetables, increasing water intake and decreasing liquid calories.  She was informed of the importance of frequent follow-up visits to maximize her success with intensive lifestyle modifications for her multiple health conditions. She was informed we would discuss her lab results at her next visit unless there is a critical issue that needs to be addressed sooner. Jordan [name] agreed to keep her next visit at the agreed upon time to discuss these results.  Objective:   Blood pressure 96/62, pulse 74, temperature 98.4 F (36.9 C), temperature source Oral, height 5\' 9"  (1.753 m), weight 252 lb (114.3 kg), SpO2 99 %. Body mass index is 37.21 kg/m.  EKG: Normal sinus rhythm, rate  70 bpm.  Indirect Calorimeter completed today shows a VO2 of 205 and a REE of 1426.  Her calculated basal metabolic rate is 99991111 thus her basal metabolic rate is worse than expected.  General: Cooperative, alert, well developed, in no acute distress. HEENT: Conjunctivae and lids unremarkable. Cardiovascular: Regular rhythm.  Lungs: Normal work of breathing. Neurologic: No focal deficits.   Lab Results  Component Value Date   CREATININE 1.07 (H) 07/31/2018   BUN 11 07/31/2018   NA 139 07/31/2018   K 4.5 07/31/2018   CL 103 07/31/2018   CO2 23 07/31/2018   Lab Results  Component Value Date   ALT 11 07/31/2018   AST 20 07/31/2018   ALKPHOS 33 (L) 07/31/2018   BILITOT 0.2 07/31/2018   Lab Results  Component Value Date   HGBA1C 5.5 12/02/2018   HGBA1C 5.6 03/28/2018   HGBA1C 5.4 02/27/2017   HGBA1C 5.6 12/15/2013   HGBA1C 5.7 02/25/2008   Lab Results  Component Value Date   TSH 1.620 03/28/2018   Lab Results  Component Value Date   CHOL 215 (H) 06/16/2012   HDL 83 06/16/2012   LDLCALC 113 (H) 06/16/2012   TRIG 93 06/16/2012   CHOLHDL 2.6 06/16/2012   Lab Results  Component Value Date  WBC WILL FOLLOW 07/31/2018   HGB WILL FOLLOW 07/31/2018   HCT WILL FOLLOW 07/31/2018   MCV WILL FOLLOW 07/31/2018   PLT WILL FOLLOW 07/31/2018   Attestation Statements:   This is the patient's first visit at [facility]. The patient's NEW PATIENT PACKET was reviewed at length. Included in the packet: current and past health history, medications, allergies, ROS, gynecologic history (women only), surgical history, family history, social history, weight history, weight loss surgery history (for those that have had weight loss surgery), nutritional evaluation, mood and food questionnaire, PHQ9, Epworth questionnaire, sleep habits questionnaire, patient life and health improvement goals questionnaire. These will all be scanned into the patient's chart under media.    During the visit, I independently reviewed the patient's EKG, bioimpedance scale results, and indirect calorimeter results. I used this information to tailor a meal plan for the patient that will help her to lose weight and will improve her obesity-related conditions going forward. I performed a medically necessary appropriate examination and/or evaluation. I discussed the assessment and treatment plan with the patient. The patient was provided an opportunity to ask questions and all were answered. The patient agreed with the plan and demonstrated an understanding of the instructions. Labs were ordered at this visit and will be reviewed at the next visit unless more critical results need to be addressed immediately. Clinical information was updated and documented in the EMR.   Time spent on visit including pre-visit chart review and post-visit care was 60 minutes.   I, Water quality scientist, CMA, am acting as Location manager for [employer], DO.  I have reviewed the above documentation for accuracy and completeness, and I agree with the above. Briscoe Deutscher, DO

## 2019-07-10 LAB — CBC WITH DIFFERENTIAL/PLATELET
Basophils Absolute: 0 10*3/uL (ref 0.0–0.2)
Basos: 1 %
EOS (ABSOLUTE): 0.1 10*3/uL (ref 0.0–0.4)
Eos: 1 %
Hemoglobin: 13.2 g/dL (ref 11.1–15.9)
Immature Grans (Abs): 0 10*3/uL (ref 0.0–0.1)
Immature Granulocytes: 0 %
Lymphocytes Absolute: 2.6 10*3/uL (ref 0.7–3.1)
Lymphs: 49 %
MCH: 28.9 pg (ref 26.6–33.0)
MCHC: 33 g/dL (ref 31.5–35.7)
MCV: 88 fL (ref 79–97)
Monocytes Absolute: 0.5 10*3/uL (ref 0.1–0.9)
Monocytes: 9 %
Neutrophils Absolute: 2.2 10*3/uL (ref 1.4–7.0)
Neutrophils: 40 %
Platelets: 232 10*3/uL (ref 150–450)
RBC: 4.57 x10E6/uL (ref 3.77–5.28)
RDW: 13.5 % (ref 11.7–15.4)
WBC: 5.4 10*3/uL (ref 3.4–10.8)

## 2019-07-10 LAB — ANEMIA PANEL
Ferritin: 14 ng/mL — ABNORMAL LOW (ref 15–150)
Folate, Hemolysate: 365 ng/mL
Folate, RBC: 913 ng/mL (ref >498)
Hematocrit: 40 % (ref 34.0–46.6)
Iron Saturation: 13 % — ABNORMAL LOW (ref 15–55)
Iron: 45 ug/dL (ref 27–159)
Retic Ct Pct: 2.2 % (ref 0.6–2.6)
Total Iron Binding Capacity: 349 ug/dL (ref 250–450)
UIBC: 304 ug/dL (ref 131–425)
Vitamin B-12: 725 pg/mL (ref 232–1245)

## 2019-07-10 LAB — COMPREHENSIVE METABOLIC PANEL
ALT: 6 IU/L (ref 0–32)
AST: 12 IU/L (ref 0–40)
Albumin/Globulin Ratio: 1.4 (ref 1.2–2.2)
Albumin: 3.8 g/dL (ref 3.8–4.8)
Alkaline Phosphatase: 45 IU/L — ABNORMAL LOW (ref 48–121)
BUN/Creatinine Ratio: 13 (ref 9–23)
BUN: 10 mg/dL (ref 6–20)
Bilirubin Total: <0.2 mg/dL (ref 0.0–1.2)
CO2: 25 mmol/L (ref 20–29)
Calcium: 9.3 mg/dL (ref 8.7–10.2)
Chloride: 102 mmol/L (ref 96–106)
Creatinine, Ser: 0.77 mg/dL (ref 0.57–1.00)
GFR calc Af Amer: 112 mL/min/{1.73_m2} (ref >59)
GFR calc non Af Amer: 98 mL/min/{1.73_m2} (ref >59)
Globulin, Total: 2.8 g/dL (ref 1.5–4.5)
Glucose: 81 mg/dL (ref 65–99)
Potassium: 4.4 mmol/L (ref 3.5–5.2)
Sodium: 139 mmol/L (ref 134–144)
Total Protein: 6.6 g/dL (ref 6.0–8.5)

## 2019-07-10 LAB — LIPID PANEL
Chol/HDL Ratio: 2.5 ratio (ref 0.0–4.4)
Cholesterol, Total: 243 mg/dL — ABNORMAL HIGH (ref 100–199)
HDL: 97 mg/dL (ref >39)
LDL Chol Calc (NIH): 130 mg/dL — ABNORMAL HIGH (ref 0–99)
Triglycerides: 96 mg/dL (ref 0–149)
VLDL Cholesterol Cal: 16 mg/dL (ref 5–40)

## 2019-07-10 LAB — VITAMIN D 25 HYDROXY (VIT D DEFICIENCY, FRACTURES): Vit D, 25-Hydroxy: 42.5 ng/mL (ref 30.0–100.0)

## 2019-07-10 LAB — T3: T3, Total: 107 ng/dL (ref 71–180)

## 2019-07-10 LAB — T4, FREE: Free T4: 1.08 ng/dL (ref 0.82–1.77)

## 2019-07-10 LAB — TSH: TSH: 1.09 u[IU]/mL (ref 0.450–4.500)

## 2019-07-10 LAB — HEMOGLOBIN A1C
Est. average glucose Bld gHb Est-mCnc: 111 mg/dL
Hgb A1c MFr Bld: 5.5 % (ref 4.8–5.6)

## 2019-07-10 LAB — INSULIN, RANDOM: INSULIN: 9.2 u[IU]/mL (ref 2.6–24.9)

## 2019-07-20 ENCOUNTER — Ambulatory Visit (HOSPITAL_COMMUNITY): Payer: Medicare Other | Admitting: Licensed Clinical Social Worker

## 2019-07-21 ENCOUNTER — Encounter (HOSPITAL_COMMUNITY): Payer: Self-pay | Admitting: Licensed Clinical Social Worker

## 2019-07-21 ENCOUNTER — Ambulatory Visit (INDEPENDENT_AMBULATORY_CARE_PROVIDER_SITE_OTHER): Payer: Medicare Other | Admitting: Licensed Clinical Social Worker

## 2019-07-21 ENCOUNTER — Telehealth (INDEPENDENT_AMBULATORY_CARE_PROVIDER_SITE_OTHER): Payer: Medicare Other | Admitting: Psychiatry

## 2019-07-21 ENCOUNTER — Other Ambulatory Visit: Payer: Self-pay

## 2019-07-21 ENCOUNTER — Encounter (HOSPITAL_COMMUNITY): Payer: Self-pay | Admitting: Psychiatry

## 2019-07-21 DIAGNOSIS — F4310 Post-traumatic stress disorder, unspecified: Secondary | ICD-10-CM

## 2019-07-21 DIAGNOSIS — F419 Anxiety disorder, unspecified: Secondary | ICD-10-CM

## 2019-07-21 DIAGNOSIS — F3132 Bipolar disorder, current episode depressed, moderate: Secondary | ICD-10-CM

## 2019-07-21 MED ORDER — HYDROXYZINE HCL 10 MG PO TABS: ORAL_TABLET | ORAL | 2 refills | Status: DC

## 2019-07-21 MED ORDER — DIVALPROEX SODIUM ER 250 MG PO TB24: 750.0000 mg | ORAL_TABLET | Freq: Every day | ORAL | 2 refills | Status: DC

## 2019-07-21 NOTE — Progress Notes (Signed)
Virtual Visit via Video Note  I connected with Jordan [name] on 07/21/19 at 10:00 AM EDT by a video enabled telemedicine application and verified that I am speaking with the correct person using two identifiers.  Location: Patient: home Provider: home office   I discussed the limitations of evaluation and management by telemedicine and the availability of in person appointments. The patient expressed understanding and agreed to proceed.  Type of Therapy: Individual Therapy   Treatment Goals addressed: "help me cope with my mood and all of the changes I am going through"   Interventions: CBT   Summary: Jordan [name] is a 39 y.o. female who presents with Moderate mixed bipolar I disorder   Suicidal/Homicidal: No -without intent/plan   Therapist Response:   Jordan [name] met with clinician for an individual session. Jordan [name] discussed her psychiatric symptoms, her current life events and her homework. Jordan [name] reported that her current financial status is upsetting her, which is the biggest concern at the moment. Clinician utilized CBT to process thoughts, feelings, and behaviors. Clinician discussed coping skills and possible problem-solving techniques. Clinician explored updates about her niece, girlfriend, and family members. Clinician noted ongoing sadness that she will not be able to go visit girlfriend due to low finances. Clinician also processed worry about dog, who is experiencing end of life problems.    Plan: Return again in 1-2 weeks.     Diagnosis: Axis I: Moderate mixed bipolar I disorder    I discussed the assessment and treatment plan with the patient. The patient was provided an opportunity to ask questions and all were answered. The patient agreed with the plan and demonstrated an understanding of the instructions.   The patient was advised to call back or seek an in-person evaluation if the symptoms worsen or if the condition fails to improve as anticipated.  I provided 45  minutes of non-face-to-face time during this encounter.   [name], LCSW
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
## 2019-07-22 ENCOUNTER — Ambulatory Visit (INDEPENDENT_AMBULATORY_CARE_PROVIDER_SITE_OTHER): Payer: Medicare Other | Admitting: Family Medicine

## 2019-07-23 ENCOUNTER — Ambulatory Visit (INDEPENDENT_AMBULATORY_CARE_PROVIDER_SITE_OTHER): Payer: Medicare Other | Admitting: Family Medicine

## 2019-07-23 ENCOUNTER — Other Ambulatory Visit: Payer: Self-pay

## 2019-07-23 ENCOUNTER — Encounter (INDEPENDENT_AMBULATORY_CARE_PROVIDER_SITE_OTHER): Payer: Self-pay | Admitting: Family Medicine

## 2019-07-23 VITALS — BP 104/69 | HR 65 | Temp 98.6°F | Ht 69.0 in | Wt 250.0 lb

## 2019-07-23 DIAGNOSIS — M797 Fibromyalgia: Secondary | ICD-10-CM | POA: Diagnosis not present

## 2019-07-23 DIAGNOSIS — Z6837 Body mass index (BMI) 37.0-37.9, adult: Secondary | ICD-10-CM

## 2019-07-23 DIAGNOSIS — R790 Abnormal level of blood mineral: Secondary | ICD-10-CM

## 2019-07-23 DIAGNOSIS — E8881 Metabolic syndrome: Secondary | ICD-10-CM | POA: Diagnosis not present

## 2019-07-23 DIAGNOSIS — E7800 Pure hypercholesterolemia, unspecified: Secondary | ICD-10-CM | POA: Diagnosis not present

## 2019-07-23 DIAGNOSIS — F319 Bipolar disorder, unspecified: Secondary | ICD-10-CM

## 2019-07-23 DIAGNOSIS — Z9189 Other specified personal risk factors, not elsewhere classified: Secondary | ICD-10-CM

## 2019-07-27 ENCOUNTER — Ambulatory Visit (INDEPENDENT_AMBULATORY_CARE_PROVIDER_SITE_OTHER): Payer: Medicare Other | Admitting: Internal Medicine

## 2019-07-27 ENCOUNTER — Other Ambulatory Visit: Payer: Self-pay

## 2019-07-27 ENCOUNTER — Encounter: Payer: Self-pay | Admitting: Internal Medicine

## 2019-07-27 VITALS — BP 79/48 | HR 71 | Temp 98.4°F | Ht 69.0 in | Wt 267.1 lb

## 2019-07-27 DIAGNOSIS — Z3009 Encounter for other general counseling and advice on contraception: Secondary | ICD-10-CM

## 2019-07-27 DIAGNOSIS — M797 Fibromyalgia: Secondary | ICD-10-CM

## 2019-07-27 MED ORDER — DROSPIRENONE-ETHINYL ESTRADIOL 3-0.02 MG PO TABS
1.0000 | ORAL_TABLET | Freq: Every day | ORAL | 11 refills | Status: DC
Start: 2019-07-27 — End: 2020-03-02

## 2019-07-27 NOTE — Progress Notes (Signed)
Chief Complaint:   OBESITY Jordan [name] is here to discuss her progress with her obesity treatment plan along with follow-up of her obesity related diagnoses. Jordan [name] is on the Category 1 Plan and states she is following her eating plan approximately 60% of the time. Jordan [name] states she is doing cardio for 15 minutes and strength training for 15 minutes 3 times per week.  Today's visit was #: 2 Starting weight: 252 lbs Starting date: 07/09/2019 Today's weight: 250 lbs Today's date: 07/23/2019 Total lbs lost to date: 2 ;bs Total lbs lost since last in-office visit: 2 lbs  Interim History: Jordan [name] says she is intolerant to red meat.  She has never been tested for alpha gal.  She is up 1.5 pounds of fluid today.    Subjective:   1. Decreased ferritin Jordan [name] is not a vegetarian.  She does not have a history of weight loss surgery.  She has fibroids.  CBC Latest Ref Rng & Units 07/09/2019 07/31/2018 03/28/2018  WBC 3.4 - 10.8 x10E3/uL 5.4 WILL FOLLOW 4.5  Hemoglobin 11.1 - 15.9 g/dL 13.2 WILL FOLLOW 13.6  Hematocrit 34.0 - 46.6 % 40.0 WILL FOLLOW 38.9  Platelets 150 - 450 x10E3/uL 232 WILL FOLLOW 187   Lab Results  Component Value Date   IRON 45 07/09/2019   TIBC 349 07/09/2019   FERRITIN 14 (L) 07/09/2019   Lab Results  Component Value Date   VITAMINB12 725 07/09/2019   2. Pure hypercholesterolemia Jordan [name] has hyperlipidemia and has been trying to improve her cholesterol levels with intensive lifestyle modification including a low saturated fat diet, exercise and weight loss. She denies any chest pain, claudication or myalgias.  Lab Results  Component Value Date   ALT 6 07/09/2019   AST 12 07/09/2019   ALKPHOS 45 (L) 07/09/2019   BILITOT <0.2 07/09/2019   Lab Results  Component Value Date   CHOL 243 (H) 07/09/2019   HDL 97 07/09/2019   LDLCALC 130 (H) 07/09/2019   TRIG 96 07/09/2019   CHOLHDL 2.5 07/09/2019   3. Insulin resistance Jordan [name] has a diagnosis of insulin resistance  based on her elevated fasting insulin level >5. She continues to work on diet and exercise to decrease her risk of diabetes.  Lab Results  Component Value Date   INSULIN 9.2 07/09/2019   Lab Results  Component Value Date   HGBA1C 5.5 07/09/2019   4. Fibromyalgia Jordan [name] takes Lyrica 150 mg twice daily for her fibromyalgia symptoms.  5. Bipolar affective disorder, PTSD Jordan [name] is followed by psychiatry. Stable.  Assessment/Plan:   1. Decreased ferritin Jordan [name] will start taking an iron supplement daily.  Orders and follow up as documented in patient record.  Counseling . Iron is essential for our bodies to make red blood cells.  Reasons that someone may be deficient include: an iron-deficient diet (more likely in those following vegan or vegetarian diets), women with heavy menses, patients with GI disorders or poor absorption, patients that have had bariatric surgery, frequent blood donors, patients with cancer, and patients with heart disease.   Jordan [name] Noble foods include dark leafy greens, red and white meats, eggs, seafood, and beans.   . Certain foods and drinks prevent your body from absorbing iron properly. Avoid eating these foods in the same meal as iron-rich foods or with iron supplements. These foods include: coffee, black tea, and red wine; milk, dairy products, and foods that are high in calcium; beans and soybeans; whole grains.  . Constipation can be a  side effect of iron supplementation. Increased water and fiber intake are helpful. Water goal: > 2 liters/day. Fiber goal: > 25 grams/day.  2. Pure hypercholesterolemia Cardiovascular risk and specific lipid/LDL goals reviewed.  We discussed several lifestyle modifications today and Jordan [name] will continue to work on diet, exercise and weight loss efforts. Orders and follow up as documented in patient record.   Counseling Intensive lifestyle modifications are the first line treatment for this issue. . Dietary changes: Increase  soluble fiber. Decrease simple carbohydrates. . Exercise changes: Moderate to vigorous-intensity aerobic activity 150 minutes per week if tolerated. . Lipid-lowering medications: see documented in medical record.  3. Insulin resistance Jordan [name] will continue to work on weight loss, exercise, and decreasing simple carbohydrates to help decrease the risk of diabetes. Jordan [name] agreed to follow-up with [place] as directed to closely monitor her progress.  4. Fibromyalgia Intensive lifestyle modifications are the first line treatment for this issue. We discussed several lifestyle modifications today and she will continue to work on diet, exercise and weight loss efforts.We will continue to monitor. Orders and follow up as documented in patient record.   Counseling . Try [email or web address]-[email or web address]/, which is a series of self-care modules designed to teach patients several techniques to manage pain.   5. Bipolar affective disorder, PTSD Will continue to monitor.  6. At risk for heart disease Jordan [name] was given approximately 15 minutes of coronary artery disease prevention counseling today. She is 39 y.o. female and has risk factors for heart disease including obesity. We discussed intensive lifestyle modifications today with an emphasis on specific weight loss instructions and strategies.   Repetitive spaced learning was employed today to elicit superior memory formation and behavioral change.  7. Class 2 severe obesity with serious comorbidity and body mass index (BMI) of 37.0 to 37.9 in adult, unspecified obesity type (HCC) Jordan [name] is currently in the action stage of change. As such, her goal is to continue with weight loss efforts. She has agreed to keeping a food journal and adhering to recommended goals of 1200 calories and 95 grams of protein.   Exercise goals: For substantial health benefits, adults should do at least 150 minutes (2 hours and 30 minutes) a week of moderate-intensity, or 75 minutes  (1 hour and 15 minutes) a week of vigorous-intensity aerobic physical activity, or an equivalent combination of moderate- and vigorous-intensity aerobic activity. Aerobic activity should be performed in episodes of at least 10 minutes, and preferably, it should be spread throughout the week.  Behavioral modification strategies: increasing lean protein intake, increasing vegetables and increasing water intake.  Arnie has agreed to follow-up with our clinic in 2 weeks. She was informed of the importance of frequent follow-up visits to maximize her success with intensive lifestyle modifications for her multiple health conditions.   Objective:   Blood pressure 104/69, pulse 65, temperature 98.6 F (37 C), temperature source Oral, height 5\' 9"  (1.753 m), weight 250 lb (113.4 kg), last menstrual period 06/29/2019, SpO2 99 %. Body mass index is 36.92 kg/m.  General: Cooperative, alert, well developed, in no acute distress. HEENT: Conjunctivae and lids unremarkable. Cardiovascular: Regular rhythm.  Lungs: Normal work of breathing. Neurologic: No focal deficits.   Lab Results  Component Value Date   CREATININE 0.77 07/09/2019   BUN 10 07/09/2019   NA 139 07/09/2019   K 4.4 07/09/2019   CL 102 07/09/2019   CO2 25 07/09/2019   Lab Results  Component Value Date   ALT 6 07/09/2019   AST 12 07/09/2019  ALKPHOS 45 (L) 07/09/2019   BILITOT <0.2 07/09/2019   Lab Results  Component Value Date   HGBA1C 5.5 07/09/2019   HGBA1C 5.5 12/02/2018   HGBA1C 5.6 03/28/2018   HGBA1C 5.4 02/27/2017   HGBA1C 5.6 12/15/2013   Lab Results  Component Value Date   INSULIN 9.2 07/09/2019   Lab Results  Component Value Date   TSH 1.090 07/09/2019   Lab Results  Component Value Date   CHOL 243 (H) 07/09/2019   HDL 97 07/09/2019   LDLCALC 130 (H) 07/09/2019   TRIG 96 07/09/2019   CHOLHDL 2.5 07/09/2019   Lab Results  Component Value Date   WBC 5.4 07/09/2019   HGB 13.2 07/09/2019   HCT 40.0  07/09/2019   MCV 88 07/09/2019   PLT 232 07/09/2019   Lab Results  Component Value Date   IRON 45 07/09/2019   TIBC 349 07/09/2019   FERRITIN 14 (L) 07/09/2019    Obesity Behavioral Intervention:   Approximately 15 minutes were spent on the discussion below.  ASK: We discussed the diagnosis of obesity with [name] today and Staci agreed to give [place] permission to discuss obesity behavioral modification therapy today.  ASSESS: Milagros has the diagnosis of obesity and her BMI today is 37.0. Odaliz is in the action stage of change.   ADVISE: Quintessa was educated on the multiple health risks of obesity as well as the benefit of weight loss to improve her health. She was advised of the need for long term treatment and the importance of lifestyle modifications to improve her current health and to decrease her risk of future health problems.  AGREE: Multiple dietary modification options and treatment options were discussed and Lanyla agreed to follow the recommendations documented in the above note.  ARRANGE: Drenda was educated on the importance of frequent visits to treat obesity as outlined per CMS and USPSTF guidelines and agreed to schedule her next follow up appointment today.  Attestation Statements:   Reviewed by clinician on day of visit: allergies, medications, problem list, medical history, surgical history, family history, social history, and previous encounter notes.  I, Water quality scientist, CMA, am acting as transcriptionist for Briscoe Deutscher, DO  I have reviewed the above documentation for accuracy and completeness, and I agree with the above. Briscoe Deutscher, DO

## 2019-07-27 NOTE — Patient Instructions (Addendum)
Jordan [name],   I am  Glad to hear you started working you out again. It is expected to flare and hurt after working out. I recommend using an anti-inflammatory before working out during the next few days. This will also help with the knee pain.   Follow up in 6 months or sooner if you need to.   - Dr. Frederico Hamman

## 2019-07-28 DIAGNOSIS — Z309 Encounter for contraceptive management, unspecified: Secondary | ICD-10-CM | POA: Insufficient documentation

## 2019-07-28 NOTE — Progress Notes (Signed)
   CC: Fibromyalgia follow up   HPI:  Jordan [name] is a 39 y.o. year-old female with PMH listed below who presents to clinic for fibromyalgia follow up. Please see problem based assessment and plan for further details.   Past Medical History:  Diagnosis Date  . Alcohol abuse   . Allergic rhinitis    pt takes flonase for episodes, no episodes in 2012  . Anxiety   . Back pain   . Basal cell carcinoma    recurrent in left maxillary, has had 5 surguries, last one 2003  . Basal cell nevus syndrome    dx age 3  . Bipolar 1 disorder ([place])   . Candidiasis, vagina    recurrent, pt has made lifestyle modifications and none recently (as of 8/12)  . Cyst of nasal sinus   . Depression    B-Polar  . Edema, lower extremity   . Fibroma    left ovarian  . Fibromyalgia   . Gallbladder disease   . Gardnerella infection    + in 08/08  . GERD (gastroesophageal reflux disease)    occ  . Herniated disc   . Joint pain   . Lactose intolerance   . Obesity   . OVARIAN CYSTECTOMY, HX OF 03/01/2006   ovarian fibroma-left 1996, L ovary and fallopian tube removed    . Paronychia of third finger of left hand 07/28/2010  . PLANTAR FASCIITIS, BILATERAL 05/24/2009  . PTSD (post-traumatic stress disorder)   . [place]    hx of herpetic requiring I+D,( was bitten by autistic child that cares fr   Review of Systems:   Review of Systems  Constitutional: Negative for chills, fever and malaise/fatigue.  Musculoskeletal: Positive for joint pain and myalgias. Negative for falls.  Neurological: Negative for dizziness and headaches.    Physical Exam:  Vitals:   07/27/19 1552 07/27/19 1553  BP:  (!) 79/48  Pulse:  71  Temp:  98.4 F (36.9 C)  TempSrc:  Oral  SpO2:  100%  Weight: 267 lb 1.6 oz (121.2 kg)   Height: 5\' 9"  (1.753 m)     General: well appearing female in NAD  MSK: No synovitis on exam. Full ROM on all joints of bilateral upper and lower extremities.    Assessment & Plan:   See  Encounters Tab for problem based charting.  Patient discussed with Dr. Daryll Drown

## 2019-07-28 NOTE — Assessment & Plan Note (Signed)
Patient started on OCP Nikki by her GYN for heavy bleeding due to uterine fibroids but has been experiencing night sweats and dizziness since starting them. She is requesting to be switched back to Yaz. She has no contraindications for OCPs. Sent prescription for Yaz to her pharmacy.

## 2019-07-28 NOTE — Assessment & Plan Note (Signed)
Patient presents for fibromyalgia follow up and thinks she is having a flare. She reports her hands and R knee are hurting. She recently started working out and has been doing weights + walking. Denies recent trauma. Has been taking her lyrica as prescribed. I discussed with her this is likely soreness from the workouts and that is it very common. Recommended taking an anti-inflammatory as needed for the R knee. Expect pain to resolve in a few days. Advised to call if it does not resolve.

## 2019-08-03 NOTE — Progress Notes (Signed)
Internal Medicine Clinic Attending  Case discussed with Dr. Santos-Sanchez at the time of the visit.  We reviewed the resident's history and exam and pertinent patient test results.  I agree with the assessment, diagnosis, and plan of care documented in the resident's note.    

## 2019-08-04 ENCOUNTER — Encounter (HOSPITAL_COMMUNITY): Payer: Self-pay | Admitting: Licensed Clinical Social Worker

## 2019-08-04 ENCOUNTER — Ambulatory Visit (INDEPENDENT_AMBULATORY_CARE_PROVIDER_SITE_OTHER): Payer: Medicare Other | Admitting: Licensed Clinical Social Worker

## 2019-08-04 ENCOUNTER — Other Ambulatory Visit: Payer: Self-pay

## 2019-08-04 DIAGNOSIS — F3132 Bipolar disorder, current episode depressed, moderate: Secondary | ICD-10-CM | POA: Diagnosis not present

## 2019-08-04 DIAGNOSIS — F419 Anxiety disorder, unspecified: Secondary | ICD-10-CM | POA: Diagnosis not present

## 2019-08-04 NOTE — Progress Notes (Signed)
Virtual Visit via Video Note  I connected with Arron Mcnaught Sobieski on 08/04/19 at 10:00 AM EDT by a video enabled telemedicine application and verified that I am speaking with the correct person using two identifiers.  Location: Patient: home Provider: office   I discussed the limitations of evaluation and management by telemedicine and the availability of in person appointments. The patient expressed understanding and agreed to proceed.   Type of Therapy: Individual Therapy   Treatment Goals addressed: "help me cope with my mood and all of the changes I am going through"   Interventions: CBT   Summary: RAYVIN ABID is a 39 y.o. female who presents with Moderate mixed bipolar I disorder   Suicidal/Homicidal: No -without intent/plan   Therapist Response:   Porter met with clinician for an individual session. Preeya discussed her psychiatric symptoms, her current life events and her homework. Admire shared ongoing financial issues and decision to start a new job at [employer] Clinician explored how physical health has been since starting this new work. Clinician utilized CBT to reflect thoughts and feelings about needing to make more money, as well as her physical health impact from being on her feet all shift. 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 Clinician discussed ongoing stress with family and her encouragement from her Doristine Bosworth, who is a positive support and influence in her life. Clinician noted the importance of listening to the positive advice from her support system, as well as being kind to herself and making choices for herself based on her needs.   Londen reported that her anxiety has been very high lately. Clinician utilized CBT to process coping skills and encouraged Jaydeen to communicate with Dr. Adele Schilder if medication needs to be adjusted. Clinician encouraged more positive coping  skills, such as meditation, mindfulness, and exercise.   Plan: Return again in 1-2 weeks.     Diagnosis: Axis I: Moderate mixed bipolar I disorder    I discussed the assessment and treatment plan with the patient. The patient was provided an opportunity to ask questions and all were answered. The patient agreed with the plan and demonstrated an understanding of the instructions.   The patient was advised to call back or seek an in-person evaluation if the symptoms worsen or if the condition fails to improve as anticipated.  I provided 45 minutes of non-face-to-face time during this encounter.   [name], LCSW

## 2019-08-18 ENCOUNTER — Ambulatory Visit (INDEPENDENT_AMBULATORY_CARE_PROVIDER_SITE_OTHER): Payer: Medicare Other | Admitting: Family Medicine

## 2019-08-18 ENCOUNTER — Ambulatory Visit (INDEPENDENT_AMBULATORY_CARE_PROVIDER_SITE_OTHER): Payer: Medicare Other | Admitting: Licensed Clinical Social Worker

## 2019-08-18 ENCOUNTER — Encounter (HOSPITAL_COMMUNITY): Payer: Self-pay | Admitting: Licensed Clinical Social Worker

## 2019-08-18 ENCOUNTER — Other Ambulatory Visit: Payer: Self-pay

## 2019-08-18 DIAGNOSIS — F3132 Bipolar disorder, current episode depressed, moderate: Secondary | ICD-10-CM | POA: Diagnosis not present

## 2019-08-18 NOTE — Progress Notes (Signed)
Virtual Visit via Video Note  I connected with Jordan [name] on 08/18/19 at 10:00 AM EDT by a video enabled telemedicine application and verified that I am speaking with the correct person using two identifiers.  Location: Patient: home Provider: office   I discussed the limitations of evaluation and management by telemedicine and the availability of in person appointments. The patient expressed understanding and agreed to proceed.   Type of Therapy: Individual Therapy   Treatment Goals addressed: "help me cope with my mood and all of the changes I am going through"   Interventions: CBT   Summary: Jordan [name] is a 39 y.o. female who presents with Moderate mixed bipolar I disorder   Suicidal/Homicidal: No -without intent/plan   Therapist Response:   Danessa met with clinician for an individual session. Chasta discussed her psychiatric symptoms, her current life events and her homework. Jahniyah shared that a lot of things have been happening over the past two weeks. Clinician utilized CBT to process changes in household and work opportunities. Clinician explored thoughts and feelings about returning niece to her parents. Clinician validated feelings that Jajaira needs to do something for herself and start looking out for herself. 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 Clinician explored the opportunity and identified the importance of self care and managing sxs of pain and mood while starting this new job. Clinician noted concerns about the health of her dog. Clinician processed through possibly having to make a hard decision about euthanasia.     Plan: Return again in 1-2 weeks.     Diagnosis: Axis I: Moderate mixed bipolar I disorder  I discussed the assessment and treatment plan with the patient. The patient was provided an opportunity to ask questions and all were answered. The patient agreed with  the plan and demonstrated an understanding of the instructions.   The patient was advised to call back or seek an in-person evaluation if the symptoms worsen or if the condition fails to improve as anticipated.  I provided 45 minutes of non-face-to-face time during this encounter.    R , LCSW  

## 2019-09-01 ENCOUNTER — Ambulatory Visit (INDEPENDENT_AMBULATORY_CARE_PROVIDER_SITE_OTHER): Payer: Medicare Other | Admitting: Licensed Clinical Social Worker

## 2019-09-01 ENCOUNTER — Encounter (HOSPITAL_COMMUNITY): Payer: Self-pay | Admitting: Licensed Clinical Social Worker

## 2019-09-01 ENCOUNTER — Ambulatory Visit (INDEPENDENT_AMBULATORY_CARE_PROVIDER_SITE_OTHER): Payer: Medicare Other | Admitting: Family Medicine

## 2019-09-01 ENCOUNTER — Other Ambulatory Visit: Payer: Self-pay

## 2019-09-01 DIAGNOSIS — F3132 Bipolar disorder, current episode depressed, moderate: Secondary | ICD-10-CM | POA: Diagnosis not present

## 2019-09-01 NOTE — Progress Notes (Signed)
Virtual Visit via Video Note  I connected with Jordan [name] on 09/01/19 at 10:00 AM EDT by a video enabled telemedicine application and verified that I am speaking with the correct person using two identifiers.  Location: Patient: home Provider: office   I discussed the limitations of evaluation and management by telemedicine and the availability of in person appointments. The patient expressed understanding and agreed to proceed.   Type of Therapy: Individual Therapy   Treatment Goals addressed: "help me cope with my mood and all of the changes I am going through"   Interventions: CBT   Summary: Jordan [name] is a 39 y.o. female who presents with Moderate mixed bipolar I disorder   Suicidal/Homicidal: No -without intent/plan   Therapist Response:   Jordan [name] met with clinician for an individual session. Jordan [name] discussed her psychiatric symptoms, her current life events and her homework. Jordan [name] shared that all the changes identified in last session continue to be in motion. Clinician utilized CBT to process Jordan [name]'s thoughts, feelings, and behaviors associated with coping with these changes. Clinician provided supportive feedback and information about positive attraction, which involves being open to the gifts of the universe. Clinician discussed ways to allow herself to enjoy the transitions and to be open minded to opportunities. Jordan [name] shared stress about finances and upcoming changes in insurance due to her new job. Clinician discussed coping skills and encouraged taking time to breathe, relax, and use art and music to soothe her.    Plan: Return again in 1-2 weeks.     Diagnosis: Axis I: Moderate mixed bipolar I disorder  I discussed the assessment and treatment plan with the patient. The patient was provided an opportunity to ask questions and all were answered. The patient agreed with the plan and demonstrated an understanding of the instructions.   The patient was advised to  call back or seek an in-person evaluation if the symptoms worsen or if the condition fails to improve as anticipated.  I provided 45 minutes of non-face-to-face time during this encounter.   [name], LCSW

## 2019-09-09 ENCOUNTER — Other Ambulatory Visit (HOSPITAL_COMMUNITY): Payer: Self-pay | Admitting: Psychiatry

## 2019-09-09 DIAGNOSIS — F4310 Post-traumatic stress disorder, unspecified: Secondary | ICD-10-CM

## 2019-09-09 DIAGNOSIS — F419 Anxiety disorder, unspecified: Secondary | ICD-10-CM

## 2019-09-09 DIAGNOSIS — F3132 Bipolar disorder, current episode depressed, moderate: Secondary | ICD-10-CM

## 2019-09-15 ENCOUNTER — Encounter (HOSPITAL_COMMUNITY): Payer: Self-pay | Admitting: Licensed Clinical Social Worker

## 2019-09-15 ENCOUNTER — Other Ambulatory Visit: Payer: Self-pay

## 2019-09-15 ENCOUNTER — Ambulatory Visit (INDEPENDENT_AMBULATORY_CARE_PROVIDER_SITE_OTHER): Payer: Medicare Other | Admitting: Licensed Clinical Social Worker

## 2019-09-15 DIAGNOSIS — F3132 Bipolar disorder, current episode depressed, moderate: Secondary | ICD-10-CM | POA: Diagnosis not present

## 2019-09-15 NOTE — Progress Notes (Signed)
Virtual Visit via Video Note  I connected with Jordan [name] on 09/15/19 at 10:00 AM EDT by a video enabled telemedicine application and verified that I am speaking with the correct person using two identifiers.  Location: Patient: home Provider: office   I discussed the limitations of evaluation and management by telemedicine and the availability of in person appointments. The patient expressed understanding and agreed to proceed.  Type of Therapy: Individual Therapy   Treatment Goals addressed: "help me cope with my mood and all of the changes I am going through"   Interventions: CBT   Summary: Jordan [name] is a 39 y.o. female who presents with Moderate mixed bipolar I disorder   Suicidal/Homicidal: No -without intent/plan   Therapist Response:   Jordan [name] met with clinician for an individual session. Jordan [name] discussed her psychiatric symptoms, her current life events and her homework. Jordan [name] shared that she had just returned this morning from her visit to her girlfriend in [place]. Clinician discussed the trip and processed thoughts and feelings about the relationship. Clinician utilized CBT to identify thoughts, feelings, and behaviors in the relationship, noting the main difference in this relationship from others. Clinician identified the tangible difference in being in an adult relationship with a functional adult, who works, has her own home, raises her child, and does not need anything from [name]. Jordan [name] agreed and reported that this is new, as she does not have to take care of her partner, as she has in the past.  Clinician discussed updates on Jordan [name] dogs health, as this was a significant worry for Jordan [name]. Clinician noted that the dog was able to have surgery and she is doing much better. Clinician reflected the relief and joy in Jordan [name] voice. Clinician explored plan for starting new job. Jordan [name] reports she will start tomorrow.    Plan: Return again in 2-3 weeks.      Diagnosis: Axis I: Moderate mixed bipolar I disorder   I discussed the assessment and treatment plan with the patient. The patient was provided an opportunity to ask questions and all were answered. The patient agreed with the plan and demonstrated an understanding of the instructions.   The patient was advised to call back or seek an in-person evaluation if the symptoms worsen or if the condition fails to improve as anticipated.  I provided 45 minutes of non-face-to-face time during this encounter.   [name], LCSW

## 2019-10-14 ENCOUNTER — Other Ambulatory Visit: Payer: Self-pay

## 2019-10-14 ENCOUNTER — Encounter (HOSPITAL_COMMUNITY): Payer: Self-pay | Admitting: Licensed Clinical Social Worker

## 2019-10-14 ENCOUNTER — Ambulatory Visit (INDEPENDENT_AMBULATORY_CARE_PROVIDER_SITE_OTHER): Payer: Medicare Other | Admitting: Licensed Clinical Social Worker

## 2019-10-14 DIAGNOSIS — F3132 Bipolar disorder, current episode depressed, moderate: Secondary | ICD-10-CM | POA: Diagnosis not present

## 2019-10-14 NOTE — Progress Notes (Signed)
Virtual Visit via Video Note  I connected with Jordan [name] on 10/14/19 at  8:00 AM EDT by a video enabled telemedicine application and verified that I am speaking with the correct person using two identifiers.  Location: Patient: home Provider: office   I discussed the limitations of evaluation and management by telemedicine and the availability of in person appointments. The patient expressed understanding and agreed to proceed.  Type of Therapy: Individual Therapy   Treatment Goals addressed: "help me cope with my mood and all of the changes I am going through"   Interventions: CBT   Summary: CHERRIE FRANCA is a 39 y.o. female who presents with Moderate mixed bipolar I disorder   Suicidal/Homicidal: No -without intent/plan   Therapist Response:   Venicia met with clinician for an individual session. Virgene discussed her psychiatric symptoms, her current life events and her homework. Aya shared that she is feeling tired and overwhelmed. Clinician utilized CBT to explore triggers to these feelings, as well as her thoughts and behaviors. Clinician provided time and space for Jakaylee to vent and process what is happening with work, family life, and her girlfriend and child in [place]. Clinician discussed options for coping or making changes. Clinician normalized feelings of being overwhelmed and identified that high levels of stress can increase physical feelings of fatigue, as well as trigger flareups of fibromyalgia. Clinician encouraged Jenasis to take time for herself, get some exercise to increase her endurance, and to work with her boss to get some more help. Clinician encouraged Jamaira about these struggles and noted that growth often feels exhausting because she is making big changes. 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    Plan: Return again in 2-3 weeks.     Diagnosis:  Axis I: Moderate mixed bipolar I disorder  I discussed the assessment and treatment plan with the patient. The patient was provided an opportunity to ask questions and all were answered. The patient agreed with the plan and demonstrated an understanding of the instructions.   The patient was advised to call back or seek an in-person evaluation if the symptoms worsen or if the condition fails to improve as anticipated.  I provided 45 minutes of non-face-to-face time during this encounter.   [name], LCSW

## 2019-10-21 ENCOUNTER — Other Ambulatory Visit: Payer: Self-pay

## 2019-10-21 ENCOUNTER — Telehealth (INDEPENDENT_AMBULATORY_CARE_PROVIDER_SITE_OTHER): Payer: Medicare Other | Admitting: Psychiatry

## 2019-10-21 ENCOUNTER — Encounter (HOSPITAL_COMMUNITY): Payer: Self-pay | Admitting: Psychiatry

## 2019-10-21 DIAGNOSIS — F3132 Bipolar disorder, current episode depressed, moderate: Secondary | ICD-10-CM | POA: Diagnosis not present

## 2019-10-21 DIAGNOSIS — F419 Anxiety disorder, unspecified: Secondary | ICD-10-CM | POA: Diagnosis not present

## 2019-10-21 DIAGNOSIS — F4310 Post-traumatic stress disorder, unspecified: Secondary | ICD-10-CM

## 2019-10-21 MED ORDER — HYDROXYZINE HCL 10 MG PO TABS: ORAL_TABLET | ORAL | 2 refills | Status: DC

## 2019-10-21 MED ORDER — DIVALPROEX SODIUM ER 250 MG PO TB24: 750.0000 mg | ORAL_TABLET | Freq: Every day | ORAL | 2 refills | Status: DC
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
## 2019-10-22 ENCOUNTER — Other Ambulatory Visit: Payer: Self-pay

## 2019-10-22 ENCOUNTER — Encounter: Payer: Self-pay | Admitting: Internal Medicine

## 2019-10-22 ENCOUNTER — Ambulatory Visit (INDEPENDENT_AMBULATORY_CARE_PROVIDER_SITE_OTHER): Payer: Medicare Other | Admitting: Internal Medicine

## 2019-10-22 VITALS — BP 102/66 | HR 65 | Temp 98.5°F | Ht 69.0 in | Wt 261.4 lb

## 2019-10-22 DIAGNOSIS — R5382 Chronic fatigue, unspecified: Secondary | ICD-10-CM

## 2019-10-22 DIAGNOSIS — M5126 Other intervertebral disc displacement, lumbar region: Secondary | ICD-10-CM

## 2019-10-22 DIAGNOSIS — F329 Major depressive disorder, single episode, unspecified: Secondary | ICD-10-CM | POA: Diagnosis not present

## 2019-10-22 DIAGNOSIS — E611 Iron deficiency: Secondary | ICD-10-CM | POA: Diagnosis not present

## 2019-10-22 NOTE — Assessment & Plan Note (Addendum)
Iron deficiency: She reports of a longstanding history of menorrhagia for which she follows up with OB/GYN.  She states that during her last menstrual period, she experienced heavy bleeding and had to use super tampons.  She had iron studies performed in May which showed ferritin of 14 and iron saturation of 13.  Since then, she stated that she has been taking over-the-counter ferrous sulfate.  Today, she reports over 9-month history of fatigue and feeling tired this is in the setting of recently going back to the work.  She works at [employer]  and states that several of her colleagues have been calling off and she has had to work overtime.  Plan: -Follow-up CBC, iron, ferritin, TIBC -Continue to follow-up with OB/GYN for menorrhagia -Will start Nu-Iron pending iron labs

## 2019-10-22 NOTE — Assessment & Plan Note (Addendum)
Chronic low back pain: She has a history of lumbar disc herniation with radiation and underwent surgery 2012.  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  Sometimes, she experiences numbness in her toes. She has no reported red flags   Plan: -Patient requests to be evaluated by her prior orthopedic surgeon Dr. Lynann Bologna

## 2019-10-22 NOTE — Patient Instructions (Signed)
[name],   It was a pleasure taking care of you today. I have ordered iron labs and will call you with the results. I have also referred you to see Orthopedic surgery as well.   Take care!  Dr. Eileen Stanford  Please call the internal medicine center clinic if you have any questions or concerns, we may be able to help and keep you from a long and expensive emergency room wait. Our clinic and after hours phone number is [phone], the best time to call is Monday through Friday 9 am to 4 pm but there is always someone available 24/7 if you have an emergency. If you need medication refills please notify your pharmacy one week in advance and they will send [place] a request.
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
## 2019-10-23 ENCOUNTER — Other Ambulatory Visit: Payer: Self-pay | Admitting: Internal Medicine

## 2019-10-23 LAB — CBC
Hematocrit: 36.7 % (ref 34.0–46.6)
Hemoglobin: 12.4 g/dL (ref 11.1–15.9)
MCH: 29.1 pg (ref 26.6–33.0)
MCHC: 33.8 g/dL (ref 31.5–35.7)
MCV: 86 fL (ref 79–97)
Platelets: 178 10*3/uL (ref 150–450)
RBC: 4.26 x10E6/uL (ref 3.77–5.28)
RDW: 15.4 % (ref 11.7–15.4)
WBC: 6.7 10*3/uL (ref 3.4–10.8)

## 2019-10-23 LAB — IRON AND TIBC
Iron Saturation: 14 % — ABNORMAL LOW (ref 15–55)
Iron: 43 ug/dL (ref 27–159)
Total Iron Binding Capacity: 298 ug/dL (ref 250–450)
UIBC: 255 ug/dL (ref 131–425)

## 2019-10-23 LAB — FERRITIN: Ferritin: 34 ng/mL (ref 15–150)

## 2019-10-23 MED ORDER — POLYSACCHARIDE IRON COMPLEX 150 MG PO CAPS: 150.0000 mg | ORAL_CAPSULE | Freq: Every day | ORAL | 2 refills | Status: DC

## 2019-10-27 NOTE — Progress Notes (Signed)
Internal Medicine Clinic Attending  Case discussed with Dr. Agyei  At the time of the visit.  We reviewed the resident's history and exam and pertinent patient test results.  I agree with the assessment, diagnosis, and plan of care documented in the resident's note.  

## 2019-10-28 ENCOUNTER — Encounter (HOSPITAL_COMMUNITY): Payer: Self-pay | Admitting: Licensed Clinical Social Worker

## 2019-10-28 ENCOUNTER — Ambulatory Visit (INDEPENDENT_AMBULATORY_CARE_PROVIDER_SITE_OTHER): Payer: Medicare Other | Admitting: Licensed Clinical Social Worker

## 2019-10-28 ENCOUNTER — Other Ambulatory Visit: Payer: Self-pay

## 2019-10-28 DIAGNOSIS — F3132 Bipolar disorder, current episode depressed, moderate: Secondary | ICD-10-CM | POA: Diagnosis not present

## 2019-10-28 NOTE — Progress Notes (Signed)
Virtual Visit via Video Note  I connected with Jordan [name] on 10/28/19 at  8:00 AM EDT by a video enabled telemedicine application and verified that I am speaking with the correct person using two identifiers.  Location: Patient: home Provider: office   I discussed the limitations of evaluation and management by telemedicine and the availability of in person appointments. The patient expressed understanding and agreed to proceed.  Type of Therapy: Individual Therapy   Treatment Goals addressed: "help me cope with my mood and all of the changes I am going through"   Interventions: CBT   Summary: Jordan [name] is a 39 y.o. female who presents with Moderate mixed bipolar I disorder   Suicidal/Homicidal: No -without intent/plan   Therapist Response:   Jordan [name] met with clinician for an individual session. Jordan [name] discussed her psychiatric symptoms, her current life events and her homework. Jordan [name] shared that over the past two weeks, everything has fallen apart. She identified that she quit her job, her car broke down, and she continues to care for her daughter and her niece, which adds to her overall stress level. Clinician utilized CBT to process through thoughts, feelings, and behaviors. Clinician explored coping skills, noting that the changes that have happened now allow her to do more things that she wants to do. Clinician noted that the job in [place] was not ideal and would not have been sustainable over a long period of time. Clinician also reflected the high levels of stress present, which always triggers Fibromyalgia attacks. Clinician urged Jordan [name] to listen to her body and to give herself space and time to rest. Clinician discussed options for going back to IOP group if needed. Jordan [name] reports she will consider it.    Plan: Return again in 2-3 weeks.     Diagnosis: Axis I: Moderate mixed bipolar I disorder   I discussed the assessment and treatment plan with the patient. The  patient was provided an opportunity to ask questions and all were answered. The patient agreed with the plan and demonstrated an understanding of the instructions.   The patient was advised to call back or seek an in-person evaluation if the symptoms worsen or if the condition fails to improve as anticipated.  I provided 45 minutes of non-face-to-face time during this encounter.   [name], LCSW

## 2019-11-05 ENCOUNTER — Ambulatory Visit (INDEPENDENT_AMBULATORY_CARE_PROVIDER_SITE_OTHER): Payer: Medicare Other | Admitting: Internal Medicine

## 2019-11-05 ENCOUNTER — Encounter: Payer: Self-pay | Admitting: Internal Medicine

## 2019-11-05 ENCOUNTER — Other Ambulatory Visit: Payer: Self-pay

## 2019-11-05 VITALS — BP 103/69 | HR 61 | Temp 98.3°F | Ht 69.0 in | Wt 258.8 lb

## 2019-11-05 DIAGNOSIS — G2581 Restless legs syndrome: Secondary | ICD-10-CM

## 2019-11-05 DIAGNOSIS — R200 Anesthesia of skin: Secondary | ICD-10-CM

## 2019-11-05 DIAGNOSIS — M48061 Spinal stenosis, lumbar region without neurogenic claudication: Secondary | ICD-10-CM | POA: Diagnosis not present

## 2019-11-05 DIAGNOSIS — D259 Leiomyoma of uterus, unspecified: Secondary | ICD-10-CM

## 2019-11-05 DIAGNOSIS — Z23 Encounter for immunization: Secondary | ICD-10-CM | POA: Diagnosis not present

## 2019-11-05 DIAGNOSIS — E611 Iron deficiency: Secondary | ICD-10-CM

## 2019-11-05 NOTE — Assessment & Plan Note (Signed)
Continuing to have numbness in her lower extremities. She feels the left is a little worse than the right. No increased weakness. Endorses symptoms of restless leg at night. She has a history of iron deficiency secondary to uterine fibroids but does not have want to have surgery yet and was recently started on nu-iron. No vision changes or dizziness. On PE difficulty distinguishing sharp/soft, decreased DTR, sensation intact throughout LE but decreased. She has a history of lumbar stenosis as well and recently saw Dr. Lynann Bologna. She states he did not think the numbness was related to her current symptoms.  Previous labs done earlier this year for similar work-up were unremarkable.   - discussed going ahead with iron transfusion. Also discussed potentially doing an EMG which she is hesitant to do. We will start with the transfusion and have her follow-up if this does not improve symptoms.
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
## 2019-11-05 NOTE — Patient Instructions (Signed)
Thank you for allowing [place] to provide your care today. Today we discussed your lower extremity numbness and iron deficiency.    I have ordered an iron transfusion. They will call you for an appointment.   Please continue to take your iron pill.  If you continue to have symptoms, we will consider doing an electromyography  Please follow-up if symptoms worsen or fail to improve.    Please call the internal medicine center clinic if you have any questions or concerns, we may be able to help and keep you from a long and expensive emergency room wait. Our clinic and after hours phone number is [phone], the best time to call is Monday through Friday 9 am to 4 pm but there is always someone available 24/7 if you have an emergency. If you need medication refills please notify your pharmacy one week in advance and they will send [place] a request.

## 2019-11-11 ENCOUNTER — Encounter (HOSPITAL_COMMUNITY): Payer: Self-pay | Admitting: Licensed Clinical Social Worker

## 2019-11-11 ENCOUNTER — Other Ambulatory Visit: Payer: Self-pay

## 2019-11-11 ENCOUNTER — Ambulatory Visit (INDEPENDENT_AMBULATORY_CARE_PROVIDER_SITE_OTHER): Payer: Medicare Other | Admitting: Licensed Clinical Social Worker

## 2019-11-11 DIAGNOSIS — F3132 Bipolar disorder, current episode depressed, moderate: Secondary | ICD-10-CM | POA: Diagnosis not present

## 2019-11-11 NOTE — Progress Notes (Signed)
Virtual Visit via Video Note  I connected with Jordan [name] on 11/11/19 at  8:00 AM EDT by a video enabled telemedicine application and verified that I am speaking with the correct person using two identifiers.  Location: Patient: home Provider: office   I discussed the limitations of evaluation and management by telemedicine and the availability of in person appointments. The patient expressed understanding and agreed to proceed.  Type of Therapy: Individual Therapy   Treatment Goals addressed: "help me cope with my mood and all of the changes I am going through"   Interventions: CBT   Summary: Jordan [name] is a 39 y.o. female who presents with Moderate mixed bipolar I disorder   Suicidal/Homicidal: No -without intent/plan   Therapist Response:   Jordan [name] met with clinician for an individual session. Jordan [name] discussed her psychiatric symptoms, her current life events and her homework. Jordan [name] shared that she is fed up with all of it. She denied suicidal thoughts, but reported feeling overwhelmed and depressed. Her pain levels are very high and she is working with doctor to alleviate some of that. However, she reports a high level of stress and obligation to care for her teenage niece and also her adult daughter, who both need housing. Clinician explored the impact of financial strain on her mood, and noted a lot of irritability, tearfulness, and hopelessness. Clinician processed through issues and explored coping skills available to her, which may not change her situation, but may help her relax and quiet her mind.    Plan: Return again in 2-3 weeks.     Diagnosis: Axis I: Moderate mixed bipolar I disorder     I discussed the assessment and treatment plan with the patient. The patient was provided an opportunity to ask questions and all were answered. The patient agreed with the plan and demonstrated an understanding of the instructions.   The patient was advised to call back or  seek an in-person evaluation if the symptoms worsen or if the condition fails to improve as anticipated.  I provided 45 minutes of non-face-to-face time during this encounter.   [name], LCSW

## 2019-11-11 NOTE — Discharge Instructions (Signed)

## 2019-11-12 ENCOUNTER — Ambulatory Visit (HOSPITAL_COMMUNITY)
Admission: RE | Admit: 2019-11-12 | Discharge: 2019-11-12 | Disposition: A | Discharge status: Home / Self Care | Payer: Medicare Other | Source: Ambulatory Visit | Attending: Internal Medicine | Admitting: Internal Medicine

## 2019-11-12 ENCOUNTER — Other Ambulatory Visit: Payer: Self-pay

## 2019-11-12 DIAGNOSIS — R200 Anesthesia of skin: Secondary | ICD-10-CM | POA: Insufficient documentation

## 2019-11-12 DIAGNOSIS — G2581 Restless legs syndrome: Secondary | ICD-10-CM

## 2019-11-12 DIAGNOSIS — E611 Iron deficiency: Secondary | ICD-10-CM | POA: Diagnosis present

## 2019-11-12 MED ORDER — SODIUM CHLORIDE 0.9 % IV SOLN
510.0000 mg | Freq: Once | INTRAVENOUS | Status: AC
  Administered 2019-11-12: 510 mg via INTRAVENOUS
  Filled 2019-11-12: qty 17

## 2019-11-13 NOTE — Progress Notes (Signed)
Internal Medicine Clinic Attending  Case discussed with Dr. Seawell  At the time of the visit.  We reviewed the resident's history and exam and pertinent patient test results.  I agree with the assessment, diagnosis, and plan of care documented in the resident's note.  

## 2019-11-24 ENCOUNTER — Other Ambulatory Visit: Payer: Self-pay

## 2019-11-24 ENCOUNTER — Ambulatory Visit (INDEPENDENT_AMBULATORY_CARE_PROVIDER_SITE_OTHER): Payer: Medicare Other | Admitting: Licensed Clinical Social Worker

## 2019-11-24 DIAGNOSIS — F3132 Bipolar disorder, current episode depressed, moderate: Secondary | ICD-10-CM

## 2019-11-24 NOTE — Progress Notes (Signed)
Virtual Visit via Video Note  I connected with Jordan [name] on 11/24/19 at  4:30 PM EDT by a video enabled telemedicine application and verified that I am speaking with the correct person using two identifiers.  Location: Patient: home Provider: office   I discussed the limitations of evaluation and management by telemedicine and the availability of in person appointments. The patient expressed understanding and agreed to proceed.   Type of Therapy: Individual Therapy   Treatment Goals addressed: "help me cope with my mood and all of the changes I am going through"   Interventions: CBT   Summary: Jordan [name] is a 39 y.o. female who presents with Moderate mixed bipolar I disorder   Suicidal/Homicidal: No -without intent/plan   Therapist Response:   Jordan [name] met with clinician for an individual session. Jordan [name] discussed her psychiatric symptoms, her current life events and her homework. Jordan [name] shared that she continues to struggle with pain due to fibromyalgia. Clinician explored medications and other treatment available to treat this pain. Clinician discussed ways to cope emotionally with stress. Clinician encouraged mindfulness and focus on gratitude in order to look for happiness. Jordan [name] updated clinician on status of social security and disability. Jordan [name] also processed her frustration that her car will likely have to return to the dealership, due to the computer breaking and no available parts in the foreseeable future.    Plan: Return again in 2-3 weeks.     Diagnosis: Axis I: Moderate mixed bipolar I disorder     I discussed the assessment and treatment plan with the patient. The patient was provided an opportunity to ask questions and all were answered. The patient agreed with the plan and demonstrated an understanding of the instructions.   The patient was advised to call back or seek an in-person evaluation if the symptoms worsen or if the condition fails to improve as  anticipated.  I provided 45 minutes of non-face-to-face time during this encounter.   [name], LCSW

## 2019-11-25 ENCOUNTER — Encounter (HOSPITAL_COMMUNITY): Payer: Self-pay | Admitting: Licensed Clinical Social Worker

## 2019-11-25 ENCOUNTER — Ambulatory Visit (HOSPITAL_COMMUNITY): Payer: Medicare Other | Admitting: Licensed Clinical Social Worker

## 2019-12-04 ENCOUNTER — Other Ambulatory Visit: Payer: Self-pay | Admitting: Student

## 2019-12-04 DIAGNOSIS — Z1231 Encounter for screening mammogram for malignant neoplasm of breast: Secondary | ICD-10-CM

## 2019-12-09 ENCOUNTER — Other Ambulatory Visit: Payer: Self-pay

## 2019-12-09 ENCOUNTER — Encounter (HOSPITAL_COMMUNITY): Payer: Self-pay | Admitting: Licensed Clinical Social Worker

## 2019-12-09 ENCOUNTER — Ambulatory Visit (INDEPENDENT_AMBULATORY_CARE_PROVIDER_SITE_OTHER): Payer: Medicare Other | Admitting: Licensed Clinical Social Worker

## 2019-12-09 DIAGNOSIS — F3132 Bipolar disorder, current episode depressed, moderate: Secondary | ICD-10-CM

## 2019-12-09 NOTE — Progress Notes (Signed)
Virtual Visit via Video Note  I connected with Jordan [name] on 12/09/19 at  8:00 AM EDT by a video enabled telemedicine application and verified that I am speaking with the correct person using two identifiers.  Location: Patient: home Provider: office   I discussed the limitations of evaluation and management by telemedicine and the availability of in person appointments. The patient expressed understanding and agreed to proceed.  Type of Therapy: Individual Therapy   Treatment Goals addressed: "help me cope with my mood and all of the changes I am going through"   Interventions: Motivational Interviewing   Summary: ZELIA YZAGUIRRE is a 39 y.o. female who presents with Moderate mixed bipolar I disorder   Suicidal/Homicidal: No -without intent/plan   Therapist Response:   Jasha met with clinician for an individual session. Eymi discussed her psychiatric symptoms, her current life events and her homework. Linder shared that she continues to feel a lot of stress over her pain, her finances, and her family situation. Clinician utilized MI OARS to reflect and summarize thoughts and feelings. Clinician provided time and space for Anesa to process her concerns about her girlfriend and son in [place]. Clinician offered supportive feedback about the childs developmental needs and offered options for treatment. Clinician also identified the importance of focusing on the here and now, what she can control, and creating a to do list in order to get all the tasks completed.     Plan: Return again in 2-3 weeks.     Diagnosis: Axis I: Moderate mixed bipolar I disorder       I discussed the assessment and treatment plan with the patient. The patient was provided an opportunity to ask questions and all were answered. The patient agreed with the plan and demonstrated an understanding of the instructions.   The patient was advised to call back or seek an in-person evaluation if the symptoms worsen  or if the condition fails to improve as anticipated.  I provided 45 minutes of non-face-to-face time during this encounter.   [name], LCSW

## 2019-12-11 ENCOUNTER — Other Ambulatory Visit: Payer: Self-pay

## 2019-12-11 ENCOUNTER — Ambulatory Visit
Admission: RE | Admit: 2019-12-11 | Discharge: 2019-12-11 | Disposition: A | Discharge status: Home / Self Care | Payer: Medicare Other | Source: Ambulatory Visit

## 2019-12-11 DIAGNOSIS — Z1231 Encounter for screening mammogram for malignant neoplasm of breast: Secondary | ICD-10-CM

## 2019-12-23 ENCOUNTER — Ambulatory Visit (INDEPENDENT_AMBULATORY_CARE_PROVIDER_SITE_OTHER): Payer: Medicare Other | Admitting: Licensed Clinical Social Worker

## 2019-12-23 ENCOUNTER — Other Ambulatory Visit: Payer: Self-pay

## 2019-12-23 DIAGNOSIS — F3132 Bipolar disorder, current episode depressed, moderate: Secondary | ICD-10-CM | POA: Diagnosis not present

## 2019-12-23 NOTE — Progress Notes (Signed)
Virtual Visit via Video Note  I connected with Jordan [name] on 12/23/19 at 10:00 AM EDT by a video enabled telemedicine application and verified that I am speaking with the correct person using two identifiers.  Location: Patient: home Provider: office   I discussed the limitations of evaluation and management by telemedicine and the availability of in person appointments. The patient expressed understanding and agreed to proceed.  Type of Therapy: Individual Therapy   Treatment Goals addressed: "help me cope with my mood and all of the changes I am going through"   Interventions: Motivational Interviewing   Summary: Jordan [name] is a 39 y.o. female who presents with Moderate mixed bipolar I disorder   Suicidal/Homicidal: No -without intent/plan   Therapist Response:   Jordan [name] met with clinician for an individual session. Jordan [name] discussed her psychiatric symptoms, her current life events and her homework. Jordan [name] shared that her unhappiness in living here without her girlfriend, not having her alone time in her home, and having such financial stress is really becoming miserable. Clinician explored updates on trying to reinstate income through disability. Clinician also processed updates on physical health, which has been highly problematic as well. Clinician utilized MI OARS to reflect and summarize thoughts and feelings. Clinician explored the thought that chronic pain may exist both in the physical body as well as the mind. Clinician discussed the option of changing thoughts about pain and identifying some positive feelings to focus on, such as gratitude.  Jordan [name] processed an incident with step father and mother which left her feeling very angry and hurt by mother. Clinician reflected the disappointment that mother does not support her step father and is more focused on herself.      Plan: Return again in 2-3 weeks.     Diagnosis: Axis I: Moderate mixed bipolar I disorder   I  discussed the assessment and treatment plan with the patient. The patient was provided an opportunity to ask questions and all were answered. The patient agreed with the plan and demonstrated an understanding of the instructions.   The patient was advised to call back or seek an in-person evaluation if the symptoms worsen or if the condition fails to improve as anticipated.  I provided 45 minutes of non-face-to-face time during this encounter.   [name], LCSW

## 2019-12-24 ENCOUNTER — Encounter (HOSPITAL_COMMUNITY): Payer: Self-pay | Admitting: Licensed Clinical Social Worker

## 2019-12-28 ENCOUNTER — Other Ambulatory Visit: Payer: Self-pay | Admitting: Internal Medicine

## 2019-12-28 DIAGNOSIS — M797 Fibromyalgia: Secondary | ICD-10-CM

## 2020-01-01 DIAGNOSIS — Z20822 Contact with and (suspected) exposure to covid-19: Secondary | ICD-10-CM | POA: Diagnosis not present

## 2020-01-04 ENCOUNTER — Ambulatory Visit: Payer: Medicare Other | Attending: Orthopedic Surgery

## 2020-01-04 ENCOUNTER — Other Ambulatory Visit: Payer: Self-pay

## 2020-01-04 DIAGNOSIS — G8929 Other chronic pain: Secondary | ICD-10-CM | POA: Diagnosis not present

## 2020-01-04 DIAGNOSIS — M5442 Lumbago with sciatica, left side: Secondary | ICD-10-CM | POA: Diagnosis not present

## 2020-01-04 DIAGNOSIS — M62830 Muscle spasm of back: Secondary | ICD-10-CM | POA: Insufficient documentation

## 2020-01-04 DIAGNOSIS — M6281 Muscle weakness (generalized): Secondary | ICD-10-CM | POA: Diagnosis not present

## 2020-01-04 DIAGNOSIS — R262 Difficulty in walking, not elsewhere classified: Secondary | ICD-10-CM

## 2020-01-04 NOTE — Therapy (Addendum)
[place], [place], [address] Phone: [phone]   Fax:  [phone]  Physical Therapy Evaluation  Patient Details  Name: Jordan [name] MRN: [id number] Date of Birth: Nov 08, 1980 Referring Provider (PT): Phylliss Bob, MD   Encounter Date: 01/04/2020   PT End of Session - 01/04/20 1023    Visit Number 1    Number of Visits 13    Date for PT Re-Evaluation 02/19/20    Authorization Type [employer] visit 6 and visit 10; PN visit 10; KX Modifier visit 15    PT Start Time 1002    PT Stop Time 1042    PT Time Calculation (min) 40 min    Activity Tolerance Patient tolerated treatment well;Patient limited by pain    Behavior During Therapy [employer] for tasks assessed/performed           Past Medical History:  Diagnosis Date  . Alcohol abuse   . Allergic rhinitis    pt takes flonase for episodes, no episodes in 2012  . Anxiety   . Back pain   . Basal cell carcinoma    recurrent in left maxillary, has had 5 surguries, last one 2003  . Basal cell nevus syndrome    dx age 55  . Bipolar 1 disorder ([place])   . Candidiasis, vagina    recurrent, pt has made lifestyle modifications and none recently (as of 8/12)  . Cervicitis 03/19/2019  . Cyst of nasal sinus   . Depression    B-Polar  . Edema, lower extremity   . Fibroma    left ovarian  . Fibromyalgia   . Gallbladder disease   . Gardnerella infection    + in 08/08  . GERD (gastroesophageal reflux disease)    occ  . Herniated disc   . Joint pain   . Lactose intolerance   . Nausea 12/05/2017  . Obesity   . OVARIAN CYSTECTOMY, HX OF 03/01/2006   ovarian fibroma-left 1996, L ovary and fallopian tube removed    . Paronychia of third finger of left hand 07/28/2010  . PLANTAR FASCIITIS, BILATERAL 05/24/2009  . PTSD (post-traumatic stress disorder)   . [place]    hx of herpetic requiring I+D,( was bitten by autistic child that cares fr     Past Surgical History:  Procedure Laterality Date  . CHOLECYSTECTOMY  1999  . LEFT OOPHORECTOMY    . LUMBAR LAMINECTOMY/DECOMPRESSION MICRODISCECTOMY N/A 08/14/2012   Procedure: LUMBAR LAMINECTOMY/DECOMPRESSION MICRODISCECTOMY Lumbar 5 -sacrum 1 decompression;  Surgeon: Sinclair Ship, MD;  Location: [facility];  Service: Orthopedics;  Laterality: N/A;  Lumbar 5 -sacrum 1 decompression  . MANDIBLE RECONSTRUCTION  89   upper anfd lower jaw cyst  . NASAL SINUS SURGERY     X 2 at age 47 & 73    There were no vitals filed for this visit.    Subjective Assessment - 01/04/20 1004    Subjective I'm disabled, but I tried to go back to work. 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 I started feeling the pain more and L4 disc is herniated. I already had the L5-S1 removed. I had an injection last month (12/01/2019) because I was in a lot of pain with constant burning, and it helped a lot."    Limitations Lifting;Standing;Walking;House hold activities    How long can you sit comfortably? <  10 minutes before needing to readjust; better in recliner and with back support    How long can you stand comfortably? 15 minutes before needing to sit down; "starts to feel like it's locking up and feels like someone is stabbing me if I don't sit soon enough"    How long can you walk comfortably? 15 minutes before needing to sit down; "starts to feel like it's locking up and feels like someone is stabbing me if I don't sit soon enough"    Patient Stated Goals Household activities (laundry, groceries); not having to depend on people    Currently in Pain? Yes    Pain Score 3     Pain Location Back    Pain Orientation Mid;Lower    Pain Descriptors / Indicators Dull;Throbbing    Pain Type Chronic pain    Pain Onset More than a month ago    Pain Frequency Constant    Aggravating Factors  Standing, walking, bending, twisting     Pain Relieving Factors pain medication, heating pad, sit in recliner with BLE elevated    Effect of Pain on Daily Activities Having to rely on children and others for help with household and community activities              [facility] PT Assessment - 01/04/20 0001      Assessment   Medical Diagnosis Lumbar radiculopathy    Referring Provider (PT) Phylliss Bob, MD    Onset Date/Surgical Date --   August 2021 - aggravation of symptoms at work   Hand Dominance Right    Prior Therapy "Few years ago for my knee. I have been here for my back before too"      Precautions   Precautions None      Restrictions   Weight Bearing Restrictions No      Balance Screen   Has the patient fallen in the past 6 months Yes    How many times? 3   been caught all 3 times; LLE "gives out" and pt falls to L   Has the patient had a decrease in activity level because of a fear of falling?  Yes    Is the patient reluctant to leave their home because of a fear of falling?  No      Home Environment   Living Environment Private residence    Living Arrangements Children   40 yo and 48 yo children   Available Help at Discharge Family    Type of [place] to enter    Entrance Stairs-Number of Steps 4    Entrance Stairs-Rails Can reach both    Home Layout Two level    Alternate Level Stairs-Number of Steps 13    Alternate Level Stairs-Rails Can reach both    Home Equipment None      Prior Function   Level of Independence Independent with basic ADLs   assistance with household activities currently   Vocation On disability    Leisure "I like to be active. I was playing softball, kickbal, and anything where I can move. I like to draw too."      Cognition   Overall Cognitive Status Within Functional Limits for tasks assessed      Observation/Other Assessments   Observations Sacral sitting in chair for improved comfort after moving from mat to chair; slouched posture;  trunk lean to R side for comfort secondary to "muscle spasm" during eval  Focus on Therapeutic Outcomes (FOTO)  66% limitation; predicted 47% limitation      ROM / Strength   AROM / PROM / Strength AROM;Strength      AROM   Overall AROM Comments Lumbar AROM assessment limited due to increased pain during eval and decreased tolerance after FL/EXT    AROM Assessment Site Lumbar    Lumbar Flexion Fingertips just above B knees    Lumbar Extension Not able to EXT > 2-3 deg lumbar EXT - limitation due to pain      Strength   Strength Assessment Site Hip;Knee;Ankle    Right/Left Hip Right;Left    Right Hip Flexion 4/5    Right Hip ABduction 4/5    Left Hip Flexion 4-/5    Left Hip ABduction 4-/5    Right/Left Knee Right;Left    Right Knee Flexion 4/5    Right Knee Extension 4/5    Left Knee Flexion 4-/5    Left Knee Extension 4-/5    Right/Left Ankle Right;Left    Right Ankle Dorsiflexion 4/5    Right Ankle Plantar Flexion 4/5   modified test in sitting   Left Ankle Dorsiflexion 4/5    Left Ankle Plantar Flexion 4/5   modified test in sitting     Palpation   Palpation comment TTP along L QL                      Objective measurements completed on examination: See above findings.       [facility] PT Treatment/Exercise - 01/04/20 0001      Self-Care   Self-Care Other Self-Care Comments    Other Self-Care Comments  Patient education provided regarding HEP, POC, anatomy of condition, directional preference, core stability/neutral spine and pelvic position, self myofascial release using tennis ball against wall at home.      Exercises   Exercises Lumbar      Lumbar Exercises: Stretches   Pelvic Tilt 20 reps    Piriformis Stretch Left;2 reps;30 seconds    Piriformis Stretch Limitations --    Other Lumbar Stretch Exercise TL standing sidebending stretch for QL 2 x 30 sec      Manual Therapy   Manual Therapy Soft tissue mobilization;Myofascial release    Soft  tissue mobilization STM and myofascial release along L QL                  PT Education - 01/04/20 1310    Education Details Patient education provided regarding HEP, POC, anatomy of condition, FOTO, directional preference (centralization vs peripheralization), core stability/neutral spine and pelvic position, self myofascial release using tennis ball against wall at home.    Person(s) Educated Patient    Methods Explanation;Demonstration;Tactile cues;Verbal cues;Handout    Comprehension Verbalized understanding;Returned demonstration;Verbal cues required;Tactile cues required            PT Short Term Goals - 01/04/20 1322      PT SHORT TERM GOAL #1   Title Pt will be independent with initial HEP.    Baseline Pt given initial HEP at evaluation 01/04/2020.    Time 3    Period Weeks    Status New    Target Date 01/25/20      PT SHORT TERM GOAL #2   Title Pt will report decreased radicular symptoms into left leg with centralized pain < 3/10.    Baseline Pt currently has radicular pain into L leg    Time 3  Period Weeks    Status New    Target Date 01/25/20      PT SHORT TERM GOAL #3   Title Patient will increase gross bilateral lower extremity strength to 4+/5 without pain.    Baseline Pt with < 4+/5 (4-/5 L hip/knee and 4/5 RLE) MMT at eval    Time 3    Period Weeks    Status New    Target Date 01/25/20      PT SHORT TERM GOAL #4   Title Pt will be able to tolerate standing and/or walking on level ground for at least 20 minutes with < 3/10 pain.    Baseline Pt requires seated break after 15 minutes of standing and/or walking    Time 3    Period Weeks    Status New    Target Date 01/25/20             PT Long Term Goals - 01/04/20 1325      PT LONG TERM GOAL #1   Title Pt will be independent with advanced HEP.    Baseline Pt given initial HEP at evaluation 01/04/2020.    Time 6    Period Weeks    Status New    Target Date 02/15/20      PT LONG TERM  GOAL #2   Title Pt will be able to stand and/or walk for at least 45 minutes on level ground without pain to allow pt to perform ADL and grocery shopping.    Baseline Pt requires seated break after 15 minutes of standing and/or walking    Time 6    Period Weeks    Status New    Target Date 02/15/20      PT LONG TERM GOAL #3   Title Patient will be able to tolerate playing at least 2 innings/rounds of softball and/or kickball with < 3/10 pain.    Baseline Patient has not played softball or kickball recently due to aggravation of symptoms    Time 6    Period Weeks    Status New    Target Date 02/15/20      PT LONG TERM GOAL #4   Title Pt will be able to sit for at least 45 minutes without having to stand or readjust due to significant increase in pain.    Baseline Pt had to readjust and stand frequently during evaluation (first time after 9-10 minutes of sitting)    Time 6    Period Weeks    Status New    Target Date 02/15/20                  Plan - 01/04/20 1043    Clinical Impression Statement Patient is a 39 year old female who presents to OPPT with complaints of low back pain accompanied by radicular symptoms down her LLE to L foot. Pt's LBP originally began in 2010 with this particular episode of aggravation beginning in August 2021 while working at [employer]. Pt reports having injection 12/01/2019 that has helped reduce her LBP tremendously. Upon arrival, pt demonstrated lateral trunk lean to the R side with complaints of a "muscle spasm", antalgic gait, and limited mobility. Lumbar AROM assessment was limited during evaluation secondary to pain. Pt expressed significant relief following STM and myofascial release along L quadratus lumborum with symptoms still present but eased. Pt currently complains of pain in both lumbar FL and EXT and has relief in one direction after prolonged time in the  other (relief with standing after sitting for a while and vice versa), making it  difficult to establish any clear directional preference at this time. Pt will benefit from skilled PT intervention to improve functional mobility and strength to increase activity tolerance.    Personal Factors and Comorbidities Time since onset of injury/illness/exacerbation;Comorbidity 1;Comorbidity 2;Comorbidity 3+;Past/Current Experience    Comorbidities see extensive PMH above    Examination-Activity Limitations Bathing;Bed Mobility;Bend;Caring for Others;Carry;Dressing;Lift;Locomotion Level;Sit;Sleep;Stairs;Squat;Stand    Examination-Participation Restrictions Cleaning;Community Activity;Laundry;Shop    Stability/Clinical Decision Making Evolving/Moderate complexity    Clinical Decision Making Moderate    Rehab Potential Good    PT Frequency 2x / week    PT Duration 6 weeks    PT Treatment/Interventions ADLs/Self Care Home Management;Cryotherapy;Electrical Stimulation;Iontophoresis 4mg /ml Dexamethasone;Moist Heat;Traction;Neuromuscular re-education;Therapeutic exercise;Therapeutic activities;Functional mobility training;Stair training;Gait training;Patient/family education;Manual techniques;Passive range of motion;Dry needling;Taping    PT Next Visit Plan Review and update HEP, assess lumbar AROM (SB and ROT) if tolerated, high level balance, 6MWT, SLUMP test, manual (STM, myofascial release) of L QL    PT Home Exercise Plan [id number] - posterior pelvic tilt, TL standing sidebending stretch, piriformis stretch    Consulted and Agree with Plan of Care Patient           Patient will benefit from skilled therapeutic intervention in order to improve the following deficits and impairments:  Decreased activity tolerance, Decreased balance, Decreased endurance, Decreased mobility, Decreased strength, Decreased range of motion, Difficulty walking, Increased muscle spasms, Pain, Improper body mechanics  Visit Diagnosis: Chronic midline low back pain with left-sided sciatica - Plan: PT plan of care  cert/re-cert  Muscle spasm of back - Plan: PT plan of care cert/re-cert  Difficulty in walking, not elsewhere classified - Plan: PT plan of care cert/re-cert  Muscle weakness (generalized) - Plan: PT plan of care cert/re-cert     Problem List Patient Active Problem List   Diagnosis Date Noted  . Iron deficiency 10/22/2019  . Contraceptive management 07/28/2019  . Contact dermatitis 03/11/2019  . Menorrhagia with regular cycle 02/09/2019  . Painful menstrual periods 12/20/2017  . Plantar fasciitis 07/18/2017  . Right knee pain 11/30/2016  . BPPV (benign paroxysmal positional vertigo) 03/01/2016  . Post traumatic stress disorder (PTSD) 11/09/2015  . Hereditary and idiopathic peripheral neuropathy 09/29/2015  . Bipolar disorder ([place]) 07/04/2015  . Idiopathic hypotension 02/03/2015  . Fibromyalgia 06/26/2010  . Insomnia 06/26/2010  . Chronic fatigue and depression 06/26/2010  . Obesity (BMI 35.0-39.9 without comorbidity) 01/17/2010    Class: Chronic  . Lumbar disc herniation of L5-S1 on the left side (MRI 2012 s/p surgery)  02/26/2008  . Allergic rhinitis 07/24/2006  . Gastroesophageal reflux disease 03/04/2006  . 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     Haydee Monica, PT, DPT 01/04/20 6:18 PM  [facility] [address] [place], [place], [address] Phone: [phone]   Fax:  [phone]  Name: Jordan [name] MRN: [id number] Date of Birth: May 07, 1980

## 2020-01-06 ENCOUNTER — Other Ambulatory Visit: Payer: Self-pay

## 2020-01-06 ENCOUNTER — Ambulatory Visit (INDEPENDENT_AMBULATORY_CARE_PROVIDER_SITE_OTHER): Payer: Medicare Other | Admitting: Licensed Clinical Social Worker

## 2020-01-06 DIAGNOSIS — F3132 Bipolar disorder, current episode depressed, moderate: Secondary | ICD-10-CM | POA: Diagnosis not present

## 2020-01-10 ENCOUNTER — Other Ambulatory Visit (HOSPITAL_COMMUNITY): Payer: Self-pay | Admitting: Psychiatry

## 2020-01-10 DIAGNOSIS — F419 Anxiety disorder, unspecified: Secondary | ICD-10-CM

## 2020-01-10 DIAGNOSIS — F4310 Post-traumatic stress disorder, unspecified: Secondary | ICD-10-CM

## 2020-01-10 DIAGNOSIS — F3132 Bipolar disorder, current episode depressed, moderate: Secondary | ICD-10-CM

## 2020-01-11 ENCOUNTER — Encounter (HOSPITAL_COMMUNITY): Payer: Self-pay | Admitting: Licensed Clinical Social Worker

## 2020-01-11 NOTE — Progress Notes (Signed)
Virtual Visit via Video Note  I connected with Jordan [name] on 01/11/20 at 10:00 AM EST by a video enabled telemedicine application and verified that I am speaking with the correct person using two identifiers.  Location: Patient: home Provider: office   I discussed the limitations of evaluation and management by telemedicine and the availability of in person appointments. The patient expressed understanding and agreed to proceed.  Type of Therapy: Individual Therapy   Treatment Goals addressed: "help me cope with my mood and all of the changes I am going through"   Interventions: Motivational Interviewing   Summary: Jordan [name] is a 39 y.o. female who presents with Moderate mixed bipolar I disorder   Suicidal/Homicidal: No -without intent/plan   Therapist Response:   Jordan [name] met with clinician for an individual session. Jordan [name] discussed her psychiatric symptoms, her current life events and her homework. Jordan [name] shared that she has been feeling a lot better due to SSI being reinstated. Clinician processed the event and identified the sense of relief and ease in being able to catch up on her bills and rent. Clinician explored interactions with nieces, who are both living with her. Clinician processed plan to move to [place] and the willingness to take younger niece with her. Clinician provided feedback of how to get guardianship of niece and how to deal with backlash from mother and sister. Clinician utilized MI to reflect pain and sadness, as well as anger with mother and sister.    Plan: Return again in 2-3 weeks.     Diagnosis: Axis I: Moderate mixed bipolar I disorder     I discussed the assessment and treatment plan with the patient. The patient was provided an opportunity to ask questions and all were answered. The patient agreed with the plan and demonstrated an understanding of the instructions.   The patient was advised to call back or seek an in-person evaluation if the  symptoms worsen or if the condition fails to improve as anticipated.  I provided 45 minutes of non-face-to-face time during this encounter.   [name], LCSW

## 2020-01-12 ENCOUNTER — Other Ambulatory Visit: Payer: Self-pay

## 2020-01-12 ENCOUNTER — Ambulatory Visit: Payer: Medicare Other

## 2020-01-12 DIAGNOSIS — M6281 Muscle weakness (generalized): Secondary | ICD-10-CM

## 2020-01-12 DIAGNOSIS — R262 Difficulty in walking, not elsewhere classified: Secondary | ICD-10-CM | POA: Diagnosis not present

## 2020-01-12 DIAGNOSIS — G8929 Other chronic pain: Secondary | ICD-10-CM | POA: Diagnosis not present

## 2020-01-12 DIAGNOSIS — M62830 Muscle spasm of back: Secondary | ICD-10-CM | POA: Diagnosis not present

## 2020-01-12 DIAGNOSIS — M5442 Lumbago with sciatica, left side: Secondary | ICD-10-CM | POA: Diagnosis not present
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
## 2020-01-20 ENCOUNTER — Telehealth (HOSPITAL_COMMUNITY): Payer: Self-pay | Admitting: *Deleted

## 2020-01-20 ENCOUNTER — Other Ambulatory Visit: Payer: Self-pay

## 2020-01-20 ENCOUNTER — Other Ambulatory Visit (HOSPITAL_COMMUNITY): Payer: Self-pay | Admitting: *Deleted

## 2020-01-20 ENCOUNTER — Telehealth (INDEPENDENT_AMBULATORY_CARE_PROVIDER_SITE_OTHER): Payer: Medicare Other | Admitting: Psychiatry

## 2020-01-20 ENCOUNTER — Ambulatory Visit (HOSPITAL_COMMUNITY): Payer: Medicare Other | Admitting: Licensed Clinical Social Worker

## 2020-01-20 ENCOUNTER — Encounter (HOSPITAL_COMMUNITY): Payer: Self-pay | Admitting: Psychiatry

## 2020-01-20 DIAGNOSIS — F419 Anxiety disorder, unspecified: Secondary | ICD-10-CM | POA: Diagnosis not present

## 2020-01-20 DIAGNOSIS — F3132 Bipolar disorder, current episode depressed, moderate: Secondary | ICD-10-CM

## 2020-01-20 DIAGNOSIS — F4310 Post-traumatic stress disorder, unspecified: Secondary | ICD-10-CM | POA: Diagnosis not present

## 2020-01-20 MED ORDER — HYDROXYZINE HCL 10 MG PO TABS: ORAL_TABLET | ORAL | 2 refills | Status: DC

## 2020-01-20 MED ORDER — DIVALPROEX SODIUM ER 250 MG PO TB24: 750.0000 mg | ORAL_TABLET | Freq: Every day | ORAL | 2 refills | Status: DC

## 2020-01-20 NOTE — Telephone Encounter (Signed)
Nurse called pt to inform her that lab orders for Valproic Acid level have been sent to [facility] on [address] understanding.
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
## 2020-01-21 ENCOUNTER — Encounter (HOSPITAL_COMMUNITY): Payer: Self-pay | Admitting: Licensed Clinical Social Worker

## 2020-01-21 ENCOUNTER — Other Ambulatory Visit: Payer: Self-pay

## 2020-01-21 ENCOUNTER — Ambulatory Visit (INDEPENDENT_AMBULATORY_CARE_PROVIDER_SITE_OTHER): Payer: Medicare Other | Admitting: Licensed Clinical Social Worker

## 2020-01-21 DIAGNOSIS — F3132 Bipolar disorder, current episode depressed, moderate: Secondary | ICD-10-CM | POA: Diagnosis not present

## 2020-01-21 NOTE — Progress Notes (Signed)
Virtual Visit via Video Note  I connected with Jordan [name] on 01/21/20 at 11:00 AM EST by a video enabled telemedicine application and verified that I am speaking with the correct person using two identifiers.  Location: Patient: home Provider: home office   I discussed the limitations of evaluation and management by telemedicine and the availability of in person appointments. The patient expressed understanding and agreed to proceed.  Type of Therapy: Individual Therapy   Treatment Goals addressed: "help me cope with my mood and all of the changes I am going through"   Interventions: Motivational Interviewing   Summary: Jordan [name] is a 39 y.o. female who presents with Moderate mixed bipolar I disorder   Suicidal/Homicidal: No -without intent/plan   Therapist Response:   Jordan [name] met with clinician for an individual session. Jordan [name] discussed her psychiatric symptoms, her current life events and her homework. Jordan [name] shared that she is doing great. She is currently visiting her girlfriend in [place] for a few weeks, and enjoying time together and away from home. Clinician processed thoughts and feelings about leaving her two nieces at home to care for themselves and her dog. Jordan [name] reported that in the past 48 hours, she has called many times, but is trying to reduce the number of calls she makes. Clinician identified the importance of trusting them to keep the house together and to take care of her dog. Clinician explored plan to get kinship funding for younger niece in order to take her to [place] when she moves. Jordan [name] reports she will complete paperwork when she returns from [place] in a few weeks. Clinician validated the significant changes she is seeing in her niece, with plans for college, involvement in student groups at school, [employer], etc. Jordan [name] reported concern about confronting her sister and mother about this plan. Clinician identified ways to prepare herself for the worst and  hope for the best. Clinician also discussed the importance of notifying niece of her plan prior to telling sister.    Plan: Return again in 2-3 weeks.     Diagnosis: Axis I: Moderate mixed bipolar I disorder     I discussed the assessment and treatment plan with the patient. The patient was provided an opportunity to ask questions and all were answered. The patient agreed with the plan and demonstrated an understanding of the instructions.   The patient was advised to call back or seek an in-person evaluation if the symptoms worsen or if the condition fails to improve as anticipated.  I provided 45 minutes of non-face-to-face time during this encounter.   [name], LCSW

## 2020-01-22 ENCOUNTER — Encounter: Payer: Medicare Other | Admitting: Physical Therapy

## 2020-02-01 ENCOUNTER — Other Ambulatory Visit: Payer: Self-pay | Admitting: Internal Medicine

## 2020-02-01 DIAGNOSIS — M797 Fibromyalgia: Secondary | ICD-10-CM

## 2020-02-01 NOTE — Telephone Encounter (Signed)
Any update on this?

## 2020-02-01 NOTE — Telephone Encounter (Signed)
As stated verbally called in 1 month supply to [employer]

## 2020-02-04 ENCOUNTER — Ambulatory Visit (INDEPENDENT_AMBULATORY_CARE_PROVIDER_SITE_OTHER): Payer: Medicare Other | Admitting: Licensed Clinical Social Worker

## 2020-02-04 ENCOUNTER — Other Ambulatory Visit: Payer: Self-pay

## 2020-02-04 ENCOUNTER — Encounter (HOSPITAL_COMMUNITY): Payer: Self-pay | Admitting: Licensed Clinical Social Worker

## 2020-02-04 DIAGNOSIS — F3132 Bipolar disorder, current episode depressed, moderate: Secondary | ICD-10-CM | POA: Diagnosis not present

## 2020-02-04 NOTE — Progress Notes (Signed)
Virtual Visit via Video Note  I connected with Jordan [name] on 02/04/20 at  9:00 AM EST by a video enabled telemedicine application and verified that I am speaking with the correct person using two identifiers.  Location: Patient: home Provider: home office   I discussed the limitations of evaluation and management by telemedicine and the availability of in person appointments. The patient expressed understanding and agreed to proceed.    Type of Therapy: Individual Therapy   Treatment Goals addressed: "help me cope with my mood and all of the changes I am going through"   Interventions: Motivational Interviewing   Summary: Jordan [name] is a 39 y.o. female who presents with Moderate mixed bipolar I disorder   Suicidal/Homicidal: No -without intent/plan   Therapist Response:   Raahi met with clinician for an individual session. Debrah discussed her psychiatric symptoms, her current life events and her homework. [name] shared that she is doing great. She is still in [place] with her girlfriend and life is very good. Clinician explored updates on plans to move in together, including options of getting a townhouse or buying a home together. Clinician processed mood and physical health since being in [place]. Clinician noted that when she ran out of medication, she had a Fibromyalgia flare up, which was scary for girlfriend. Clinician ascertained that other medications are filled and provided feedback on how to get Depakote transferred from her [place] pharmacy to the pharmacy in [place]. Clinician discussed relationships with girlfriend and son. [name] reports that she feels right at home and she has been doing a lot of babysitting and bonding with son. Clinician validated the importance of that relationship for her and the son, as well as girlfriend.     Plan: Return again in 2-3 weeks.     Diagnosis: Axis I: Moderate mixed bipolar I disorder     I discussed the assessment and  treatment plan with the patient. The patient was provided an opportunity to ask questions and all were answered. The patient agreed with the plan and demonstrated an understanding of the instructions.   The patient was advised to call back or seek an in-person evaluation if the symptoms worsen or if the condition fails to improve as anticipated.  I provided 45 minutes of non-face-to-face time during this encounter.    R , LCSW  

## 2020-02-24 ENCOUNTER — Telehealth: Payer: Self-pay

## 2020-02-24 NOTE — Telephone Encounter (Signed)
Requesting a letter for work. Please call pt back.  

## 2020-02-24 NOTE — Telephone Encounter (Signed)
Pt states she needs a letter from the doctor stating she has fibromyalgia. States she is trying to get a job and they wanted to know about her diagnosis.  CMA asked pt to confirm if letter with dx  was all they needed or was letter needed stating fibromyalgia my limit her ability to perform certain duties.   Pt states she also has a form that she will email to CMA to give to MD to look over.  Will hold off on sending letter request to MD until patient looks into matter further and call office back.Criss Alvine, Saavi Mceachron Cassady1/5/202211:10 AM

## 2020-02-25 ENCOUNTER — Ambulatory Visit (INDEPENDENT_AMBULATORY_CARE_PROVIDER_SITE_OTHER): Payer: Medicare Other | Admitting: Licensed Clinical Social Worker

## 2020-02-25 ENCOUNTER — Other Ambulatory Visit: Payer: Self-pay

## 2020-02-25 DIAGNOSIS — F3132 Bipolar disorder, current episode depressed, moderate: Secondary | ICD-10-CM

## 2020-02-25 LAB — VALPROIC ACID LEVEL: Valproic Acid Lvl: 82 ug/mL (ref 50–100)

## 2020-02-28 ENCOUNTER — Encounter (HOSPITAL_COMMUNITY): Payer: Self-pay | Admitting: Licensed Clinical Social Worker

## 2020-02-28 NOTE — Progress Notes (Signed)
Virtual Visit via Video Note  I connected with Jordan [name] on 02/28/20 at  9:00 AM EST by a video enabled telemedicine application and verified that I am speaking with the correct person using two identifiers.  Location: Patient: home Provider: office   I discussed the limitations of evaluation and management by telemedicine and the availability of in person appointments. The patient expressed understanding and agreed to proceed.  Type of Therapy: Individual Therapy   Treatment Goals addressed: "help me cope with my mood and all of the changes I am going through"   Interventions: Motivational Interviewing   Summary: LUWANA BUTRICK is a 40 y.o. female who presents with Moderate mixed bipolar I disorder   Suicidal/Homicidal: No -without intent/plan   Therapist Response:   Ivelisse met with clinician for an individual session. Sophiarose discussed her psychiatric symptoms, her current life events and her homework. Silvina shared that she is back home and has applied and interviewed for a job with [employer]. Clinician processed this decision and explored her role and the impact of Jaylyne's physical issues on her ability to work. Tumeka reports that she has submitted her medical records and she will do what she can. Clinician explored outcomes of trip to [place]. Alanea processed concerns about girlfriend's son, who is likely autistic, but has been taught little to no boundaries, has little guidance, and is in a constant state of change due to staying with multiple caregivers (parent, other parent, and grandparents). 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 Briyanna shared that she does not want to do that. Clinician encouraged Jerilee to be honest with girlfriend about her concerns and to explore ways that the boundaries and consistency can be put in place.    Plan: Return again in 2-3  weeks.     Diagnosis: Axis I: Moderate mixed bipolar I disorder        I discussed the assessment and treatment plan with the patient. The patient was provided an opportunity to ask questions and all were answered. The patient agreed with the plan and demonstrated an understanding of the instructions.   The patient was advised to call back or seek an in-person evaluation if the symptoms worsen or if the condition fails to improve as anticipated.  I provided 45 minutes of non-face-to-face time during this encounter.   [name], LCSW

## 2020-03-02 ENCOUNTER — Encounter: Payer: Self-pay | Admitting: Student

## 2020-03-02 ENCOUNTER — Ambulatory Visit (INDEPENDENT_AMBULATORY_CARE_PROVIDER_SITE_OTHER): Payer: Medicare Other | Admitting: Student

## 2020-03-02 ENCOUNTER — Other Ambulatory Visit: Payer: Self-pay

## 2020-03-02 VITALS — BP 109/66 | HR 64 | Temp 97.7°F | Ht 69.0 in | Wt 274.0 lb

## 2020-03-02 DIAGNOSIS — M797 Fibromyalgia: Secondary | ICD-10-CM

## 2020-03-02 DIAGNOSIS — D500 Iron deficiency anemia secondary to blood loss (chronic): Secondary | ICD-10-CM | POA: Diagnosis not present

## 2020-03-02 DIAGNOSIS — N920 Excessive and frequent menstruation with regular cycle: Secondary | ICD-10-CM | POA: Diagnosis not present

## 2020-03-02 DIAGNOSIS — E611 Iron deficiency: Secondary | ICD-10-CM

## 2020-03-02 DIAGNOSIS — F319 Bipolar disorder, unspecified: Secondary | ICD-10-CM

## 2020-03-02 DIAGNOSIS — Z6841 Body Mass Index (BMI) 40.0 and over, adult: Secondary | ICD-10-CM

## 2020-03-02 DIAGNOSIS — E669 Obesity, unspecified: Secondary | ICD-10-CM

## 2020-03-02 MED ORDER — PREGABALIN 150 MG PO CAPS: 150.0000 mg | ORAL_CAPSULE | Freq: Two times a day (BID) | ORAL | 0 refills | Status: DC

## 2020-03-02 NOTE — Patient Instructions (Signed)
Jordan [name],  Given your continued heavy bleeding, I recommend that you follow up with gynecology. You may ask about different birth control options although may ultimately benefit from source control with surgery if this is there recommendation.   Today, we will check your iron and your blood counts.   I have also refilled your prescription for Lyrica.   I will reach out to Tally [facility] with [facility] and see if we can get you an appointment to be seen for Ozempic for weight loss. In the meantime, I recommend continued follow up with psychiatry and portion control above all.   Please let me know if you have any severe weakness or fatigue, loss of consciousness, or any other concerning symptoms.   Thank you and take care,  Dr. Leeann Must injection solution What is this medicine? SEMAGLUTIDE (Sem a GLOO tide) is used to improve blood sugar control in adults with type 2 diabetes. This medicine may be used with other diabetes medicines. This drug may also reduce the risk of heart attack or stroke if you have type 2 diabetes and risk factors for heart disease. This medicine may be used for other purposes; ask your health care provider or pharmacist if you have questions. COMMON BRAND NAME(S): OZEMPIC What should I tell my health care provider before I take this medicine? They need to know if you have any of these conditions:  endocrine tumors (MEN 2) or if someone in your family had these tumors  eye disease, vision problems  history of pancreatitis  kidney disease  stomach problems  thyroid cancer or if someone in your family had thyroid cancer  an unusual or allergic reaction to semaglutide, other medicines, foods, dyes, or preservatives  pregnant or trying to get pregnant  breast-feeding How should I use this medicine? This medicine is for injection under the skin of your upper leg (thigh), stomach area, or upper arm. It is given once every week  (every 7 days). You will be taught how to prepare and give this medicine. Use exactly as directed. Take your medicine at regular intervals. Do not take it more often than directed. If you use this medicine with insulin, you should inject this medicine and the insulin separately. Do not mix them together. Do not give the injections right next to each other. Change (rotate) injection sites with each injection. It is important that you put your used needles and syringes in a special sharps container. Do not put them in a trash can. If you do not have a sharps container, call your pharmacist or healthcare provider to get one. A special MedGuide will be given to you by the pharmacist with each prescription and refill. Be sure to read this information carefully each time. This drug comes with INSTRUCTIONS FOR USE. Ask your pharmacist for directions on how to use this drug. Read the information carefully. Talk to your pharmacist or health care provider if you have questions. Talk to your pediatrician regarding the use of this medicine in children. Special care may be needed. Overdosage: If you think you have taken too much of this medicine contact a poison control center or emergency room at once. NOTE: This medicine is only for you. Do not share this medicine with others. What if I miss a dose? If you miss a dose, take it as soon as you can within 5 days after the missed dose. Then take your next dose at your regular weekly time. If it  has been longer than 5 days after the missed dose, do not take the missed dose. Take the next dose at your regular time. Do not take double or extra doses. If you have questions about a missed dose, contact your health care provider for advice. What may interact with this medicine?  other medicines for diabetes Many medications may cause changes in blood sugar, these include:  alcohol containing beverages  antiviral medicines for HIV or AIDS  aspirin and aspirin-like  drugs  certain medicines for blood pressure, heart disease, irregular heart beat  chromium  diuretics  female hormones, such as estrogens or progestins, birth control pills  fenofibrate  gemfibrozil  isoniazid  lanreotide  female hormones or anabolic steroids  MAOIs like Carbex, Eldepryl, Marplan, Nardil, and Parnate  medicines for weight loss  medicines for allergies, asthma, cold, or cough  medicines for depression, anxiety, or psychotic disturbances  niacin  nicotine  NSAIDs, medicines for pain and inflammation, like ibuprofen or naproxen  octreotide  pasireotide  pentamidine  phenytoin  probenecid  quinolone antibiotics such as ciprofloxacin, levofloxacin, ofloxacin  some herbal dietary supplements  steroid medicines such as prednisone or cortisone  sulfamethoxazole; trimethoprim  thyroid hormones Some medications can hide the warning symptoms of low blood sugar (hypoglycemia). You may need to monitor your blood sugar more closely if you are taking one of these medications. These include:  beta-blockers, often used for high blood pressure or heart problems (examples include atenolol, metoprolol, propranolol)  clonidine  guanethidine  reserpine This list may not describe all possible interactions. Give your health care provider a list of all the medicines, herbs, non-prescription drugs, or dietary supplements you use. Also tell them if you smoke, drink alcohol, or use illegal drugs. Some items may interact with your medicine. What should I watch for while using this medicine? Visit your doctor or health care professional for regular checks on your progress. Drink plenty of fluids while taking this medicine. Check with your doctor or health care professional if you get an attack of severe diarrhea, nausea, and vomiting. The loss of too much body fluid can make it dangerous for you to take this medicine. A test called the HbA1C (A1C) will be monitored.  This is a simple blood test. It measures your blood sugar control over the last 2 to 3 months. You will receive this test every 3 to 6 months. Learn how to check your blood sugar. Learn the symptoms of low and high blood sugar and how to manage them. Always carry a quick-source of sugar with you in case you have symptoms of low blood sugar. Examples include hard sugar candy or glucose tablets. Make sure others know that you can choke if you eat or drink when you develop serious symptoms of low blood sugar, such as seizures or unconsciousness. They must get medical help at once. Tell your doctor or health care professional if you have high blood sugar. You might need to change the dose of your medicine. If you are sick or exercising more than usual, you might need to change the dose of your medicine. Do not skip meals. Ask your doctor or health care professional if you should avoid alcohol. Many nonprescription cough and cold products contain sugar or alcohol. These can affect blood sugar. Pens should never be shared. Even if the needle is changed, sharing may result in passing of viruses like hepatitis or HIV. Wear a medical ID bracelet or chain, and carry a card that describes your  disease and details of your medicine and dosage times. Do not become pregnant while taking this medicine. Women should inform their doctor if they wish to become pregnant or think they might be pregnant. There is a potential for serious side effects to an unborn child. Talk to your health care professional or pharmacist for more information. What side effects may I notice from receiving this medicine? Side effects that you should report to your doctor or health care professional as soon as possible:  allergic reactions like skin rash, itching or hives, swelling of the face, lips, or tongue  breathing problems  changes in vision  diarrhea that continues or is severe  lump or swelling on the neck  severe  nausea  signs and symptoms of infection like fever or chills; cough; sore throat; pain or trouble passing urine  signs and symptoms of low blood sugar such as feeling anxious, confusion, dizziness, increased hunger, unusually weak or tired, sweating, shakiness, cold, irritable, headache, blurred vision, fast heartbeat, loss of consciousness  signs and symptoms of kidney injury like trouble passing urine or change in the amount of urine  trouble swallowing  unusual stomach upset or pain  vomiting Side effects that usually do not require medical attention (report to your doctor or health care professional if they continue or are bothersome):  constipation  diarrhea  nausea  pain, redness, or irritation at site where injected  stomach upset This list may not describe all possible side effects. Call your doctor for medical advice about side effects. You may report side effects to FDA at 1-800-FDA-1088. Where should I keep my medicine? Keep out of the reach of children. Store unopened pens in a refrigerator between 2 and 8 degrees C (36 and 46 degrees F). Do not freeze. Protect from light and heat. After you first use the pen, it can be stored for 56 days at room temperature between 15 and 30 degrees C (59 and 86 degrees F) or in a refrigerator. Throw away your used pen after 56 days or after the expiration date, whichever comes first. Do not store your pen with the needle attached. If the needle is left on, medicine may leak from the pen. NOTE: This sheet is a summary. It may not cover all possible information. If you have questions about this medicine, talk to your doctor, pharmacist, or health care provider.  2021 Elsevier/Gold Standard (2018-10-21 09:41:51)

## 2020-03-03 ENCOUNTER — Other Ambulatory Visit: Payer: Self-pay | Admitting: Student

## 2020-03-03 ENCOUNTER — Telehealth: Payer: Self-pay

## 2020-03-03 LAB — FERRITIN: Ferritin: 94 ng/mL (ref 15–150)

## 2020-03-03 LAB — CBC
Hematocrit: 37.5 % (ref 34.0–46.6)
Hemoglobin: 12.7 g/dL (ref 11.1–15.9)
MCH: 29.3 pg (ref 26.6–33.0)
MCHC: 33.9 g/dL (ref 31.5–35.7)
MCV: 86 fL (ref 79–97)
Platelets: 229 10*3/uL (ref 150–450)
RBC: 4.34 x10E6/uL (ref 3.77–5.28)
RDW: 13.6 % (ref 11.7–15.4)
WBC: 6.1 10*3/uL (ref 3.4–10.8)

## 2020-03-03 LAB — IRON AND TIBC
Iron Saturation: 22 % (ref 15–55)
Iron: 56 ug/dL (ref 27–159)
Total Iron Binding Capacity: 251 ug/dL (ref 250–450)
UIBC: 195 ug/dL (ref 131–425)
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
## 2020-03-03 NOTE — Assessment & Plan Note (Signed)
Patient states that she stopped going to her gynecologist recently after she was told she was going to require surgery for fibroids inducing her chronic menorrhagia. She was noted to have associated severe IDA requiring IV iron transfusion May 2021. Repeat iron studies 10/22/19 showed improved anemia although ferritin was 34 and iron saturation was 14%, so she was started on 3 months of Nu-iron, which she finished taking last month. Continues to have ongoing heavy, regular periods, requiring super tampon change every 1 hour.   - Repeat iron studies including ferritin - Replace as needed - Encouraged patient to re-establish with her gynecologist as surgery may be best option of source control; patient in agreement

## 2020-03-03 NOTE — Assessment & Plan Note (Signed)
Patient states that she is currently seeing counseling and psychiatry for bipolar disorder. She continues to take her medications daily. She endorses emotional eating and is concerned her psychiatric medications may be causing worsening weight gain, although states she feels stable from a mental standpoint currently. Normal affect today.   - Continue Hydroxyzine 10mg  for anxiety although note this may contribute to fatigue - Continue depakote ER 750mg  daily - Encouraged patient to discuss weight concerns with psychiatrist

## 2020-03-03 NOTE — Assessment & Plan Note (Addendum)
Patient is aware that her weight is negatively impacting her health and wellness, and desires to lose weight. She notes emotional eating which she believes is a large contributor. She notes she is not able to be as active as she'd like due to chronic back pain after disc surgery, although notes she is trying. Is averse to more surgeries due to many surgeries in the past. Is interested in pursuing ozempic if she is able to get this covered by insurance as she does note financial strain.   - Referral sent to [facility]; however wait is 2-3 months - Will get new referral to [facility] if patient is open to this, hopefully for Ozempic coverage and potential discussions regarding bariatric surgery if open to this - Encouraged portion control, limiting simple sugars, psychiatry follow-up, and exercise

## 2020-03-03 NOTE — Assessment & Plan Note (Addendum)
Patient endorses fatigue as well as chronic back pain, although states pain is at her baseline. She takes meloxicam and lyrica regularly and tramadol very rarely. No longer uses voltaren gel or Robaxin.   - Continue meloxicam 15mg  daily - Refilled Lyrica 150mg  twice daily - Limit tramadol 50mg  q6 hours to only severe flares - Continued to encourage regular exercise

## 2020-03-03 NOTE — Assessment & Plan Note (Signed)
See "Menorrhagia".

## 2020-03-03 NOTE — Telephone Encounter (Signed)
Requesting lab results, please call pt back.  

## 2020-03-04 ENCOUNTER — Other Ambulatory Visit: Payer: Self-pay | Admitting: Student

## 2020-03-04 ENCOUNTER — Telehealth: Payer: Self-pay | Admitting: Student

## 2020-03-04 DIAGNOSIS — D500 Iron deficiency anemia secondary to blood loss (chronic): Secondary | ICD-10-CM

## 2020-03-04 MED ORDER — POLYSACCHARIDE IRON COMPLEX 150 MG PO CAPS: 150.0000 mg | ORAL_CAPSULE | Freq: Every day | ORAL | 0 refills | Status: DC

## 2020-03-04 NOTE — Telephone Encounter (Signed)
Spoke with patient regarding lab results, explaining that her CBC does not show anemia and that her iron is technically back within normal limits, although with continued menorrhagia, recommend continuing oral iron replacement daily for 3 months then re-checking.   Sent Nu-iron 3 month supply to her pharmacy with note she may take OTC ferrous sulfate 324mg  daily if preferred.   She is eager to get started on Ozempic for weight loss. Will be in touch with Chilon about possibility of getting her an appointment at [facility] as [name]. If unsuccessful in getting sooner appointment can try to see if we can get this covered.   Instructed to follow up in 3 months if not sooner if desired for continued weight loss counseling.   Jordan [name], PGY1 03/04/2020, 3:18 PM Pager: [phone]

## 2020-03-04 NOTE — Progress Notes (Signed)
Internal Medicine Clinic Attending  Case discussed with Dr. Speakman  At the time of the visit.  We reviewed the resident's history and exam and pertinent patient test results.  I agree with the assessment, diagnosis, and plan of care documented in the resident's note.  

## 2020-03-17 ENCOUNTER — Encounter (HOSPITAL_COMMUNITY): Payer: Self-pay | Admitting: Licensed Clinical Social Worker

## 2020-03-17 ENCOUNTER — Ambulatory Visit (INDEPENDENT_AMBULATORY_CARE_PROVIDER_SITE_OTHER): Payer: Medicare Other | Admitting: Licensed Clinical Social Worker

## 2020-03-17 ENCOUNTER — Other Ambulatory Visit: Payer: Self-pay

## 2020-03-17 DIAGNOSIS — F3132 Bipolar disorder, current episode depressed, moderate: Secondary | ICD-10-CM

## 2020-03-17 NOTE — Progress Notes (Signed)
Virtual Visit via Video Note  I connected with Jordan [name] on 03/17/20 at  9:00 AM EST by a video enabled telemedicine application and verified that I am speaking with the correct person using two identifiers.  Location: Patient: home Provider: home office   I discussed the limitations of evaluation and management by telemedicine and the availability of in person appointments. The patient expressed understanding and agreed to proceed.  Type of Therapy: Individual Therapy   Treatment Goals addressed: "help me cope with my mood and all of the changes I am going through"   Interventions: Motivational Interviewing   Summary: Jordan [name] is a 40 y.o. female who presents with Moderate mixed bipolar I disorder   Suicidal/Homicidal: No -without intent/plan   Therapist Response:   Jordan [name] met with clinician for an individual session. Jordan [name] discussed her psychiatric symptoms, her current life events and her homework. Jordan [name] shared that she is ready to go back to [place] because there she has appropriate boundaries and no one expects anything from her. Clinician utilized MI OARS to reflect and summarize thoughts and feelings. Clinician noted the challenges and strengths of being there. Clinician explored updates about her household and her children. Jordan [name] shared frustration with having people in her personal space, noting that she has no privacy. Clinician explored options for setting boundaries in the household, as well as establishing a chore wheel. 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  Clinician complete PHQ 2 &9 and [place] assessments.  Plan: Return again in 2-3 weeks.     Diagnosis: Axis I: Moderate mixed bipolar I disorder     I discussed the assessment and treatment plan with the patient. The patient was provided an opportunity to ask questions and all were answered. The patient agreed  with the plan and demonstrated an understanding of the instructions.   The patient was advised to call back or seek an in-person evaluation if the symptoms worsen or if the condition fails to improve as anticipated.  I provided 50 minutes of non-face-to-face time during this encounter.   [name], LCSW

## 2020-03-29 ENCOUNTER — Ambulatory Visit (INDEPENDENT_AMBULATORY_CARE_PROVIDER_SITE_OTHER): Payer: Medicare Other | Admitting: Licensed Clinical Social Worker

## 2020-03-29 ENCOUNTER — Other Ambulatory Visit: Payer: Self-pay

## 2020-03-29 ENCOUNTER — Encounter (HOSPITAL_COMMUNITY): Payer: Self-pay | Admitting: Licensed Clinical Social Worker

## 2020-03-29 DIAGNOSIS — F3132 Bipolar disorder, current episode depressed, moderate: Secondary | ICD-10-CM

## 2020-03-29 NOTE — Progress Notes (Signed)
Virtual Visit via Video Note  I connected with Jordan [name] on 03/29/20 at  9:00 AM EST by a video enabled telemedicine application and verified that I am speaking with the correct person using two identifiers.  Location: Patient: home Provider: office   I discussed the limitations of evaluation and management by telemedicine and the availability of in person appointments. The patient expressed understanding and agreed to proceed.   Type of Therapy: Individual Therapy   Treatment Goals addressed: "help me cope with my mood and all of the changes I am going through"   Interventions: Motivational Interviewing   Summary: Jordan [name] is a 40 y.o. female who presents with Moderate mixed bipolar I disorder   Suicidal/Homicidal: No -without intent/plan   Therapist Response:   Jordan [name] met with clinician for an individual session. Jerelene discussed her psychiatric symptoms, her current life events and her homework. Alyona shared that her stress levels have been increased due to low finances, many working hours, and continuing to care for her nieces. Clinician explored her options using MI OARS. Clinician discussed plan to move to [place], as well as recent opportunity to go to [place] for work. Clinician questioned purpose of the move and noted high motivation to "get away" from her family. Clinician discussed coping skills and ways to manage anxiety, stress and anger. Clinician challenged urge to become physically aggressive. Clinician encouraged using exercise and activities outside of the home for stress relief.     Plan: Return again in 2-3 weeks.     Diagnosis: Axis I: Moderate mixed bipolar I disorder      I discussed the assessment and treatment plan with the patient. The patient was provided an opportunity to ask questions and all were answered. The patient agreed with the plan and demonstrated an understanding of the instructions.   The patient was advised to call back or seek an  in-person evaluation if the symptoms worsen or if the condition fails to improve as anticipated.  I provided 45 minutes of non-face-to-face time during this encounter.   [name], LCSW

## 2020-04-03 ENCOUNTER — Other Ambulatory Visit: Payer: Self-pay | Admitting: Student

## 2020-04-03 DIAGNOSIS — M797 Fibromyalgia: Secondary | ICD-10-CM

## 2020-04-11 ENCOUNTER — Ambulatory Visit (INDEPENDENT_AMBULATORY_CARE_PROVIDER_SITE_OTHER): Payer: Medicare Other | Admitting: Podiatry

## 2020-04-11 ENCOUNTER — Encounter: Payer: Self-pay | Admitting: Internal Medicine

## 2020-04-11 ENCOUNTER — Encounter: Payer: Self-pay | Admitting: Podiatry

## 2020-04-11 ENCOUNTER — Ambulatory Visit (INDEPENDENT_AMBULATORY_CARE_PROVIDER_SITE_OTHER): Payer: Medicare Other

## 2020-04-11 ENCOUNTER — Ambulatory Visit (INDEPENDENT_AMBULATORY_CARE_PROVIDER_SITE_OTHER): Payer: Medicare Other | Admitting: Internal Medicine

## 2020-04-11 ENCOUNTER — Other Ambulatory Visit: Payer: Self-pay

## 2020-04-11 VITALS — BP 101/53 | HR 60 | Temp 98.6°F | Ht 69.0 in | Wt 266.1 lb

## 2020-04-11 DIAGNOSIS — M25532 Pain in left wrist: Secondary | ICD-10-CM | POA: Insufficient documentation

## 2020-04-11 DIAGNOSIS — M722 Plantar fascial fibromatosis: Secondary | ICD-10-CM

## 2020-04-11 MED ORDER — TRIAMCINOLONE ACETONIDE 10 MG/ML IJ SUSP
10.0000 mg | Freq: Once | INTRAMUSCULAR | Status: AC
  Administered 2020-04-11: 10 mg

## 2020-04-11 MED ORDER — TRIAMCINOLONE ACETONIDE 10 MG/ML IJ SUSP: 10.0000 mg | Freq: Once | INTRAMUSCULAR | Status: DC

## 2020-04-11 NOTE — Patient Instructions (Addendum)
Ms. Wohlfarth,   Thanks for seeing me today.  Here are my recommendations today.  I will refer you to the orthopedic doctor for an urgent appointment.  They will be able to obtain imaging and advise you on splinting.  Take care! Dr. Eileen Stanford  Please call the internal medicine center clinic if you have any questions or concerns, we may be able to help and keep you from a long and expensive emergency room wait. Our clinic and after hours phone number is [phone], the best time to call is Monday through Friday 9 am to 4 pm but there is always someone available 24/7 if you have an emergency. If you need medication refills please notify your pharmacy one week in advance and they will send [place] a request.   If you have not gotten the COVID vaccine, I recommend doing so:  You may get it at your local [facility] or [facility] OR To schedule an appointment for a COVID vaccine or be added to the vaccine wait list: Go to [email or web address]   OR Go to [email or web address]                  OR Call [phone]                                     OR Call [phone] and select Option 2
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
## 2020-04-11 NOTE — Assessment & Plan Note (Signed)
Left wrist pain: Jordan [name] states that she was in her usual state of health until 4 days ago when she unfortunately fell while going down the stairs.  She states that she fell about 13 steps and landed on an her left arm, outstretched.  She denies any preceding cardiopulmonary symptoms or head trauma.  Initially the left wrist pain was severe and her daughter wrapped it with an Ace bandage.  Currently, her pain is at about 3 out of 10 and worsens with flexion and extension of her left wrist.  This has limited her ability to work.  She complains of soreness of the right arm and back.  DDx: Given the acute setting, I would be worried about either radial head or scaphoid fracture.  I referred to orthopedic surgery for further management including imaging and advised on splinting.

## 2020-04-12 ENCOUNTER — Ambulatory Visit (INDEPENDENT_AMBULATORY_CARE_PROVIDER_SITE_OTHER): Payer: Medicare Other | Admitting: Licensed Clinical Social Worker

## 2020-04-12 DIAGNOSIS — F3132 Bipolar disorder, current episode depressed, moderate: Secondary | ICD-10-CM | POA: Diagnosis not present

## 2020-04-12 NOTE — Progress Notes (Signed)
Subjective:   Patient ID: Jordan [name], female   DOB: 40 y.o.   MRN: [id number]   HPI Patient presents with heel pain of both feet stating that they have both been bothering her.  Also complains of discomfort when she gets up in the morning and feels like she is not able to adequately stretch her feet   ROS      Objective:  Physical Exam  Patient does have normal neurovascular status found to have chronic discomfort plantar fascial bilateral which goes well but seems to gradually getting worse with also problems with getting up in the morning after periods of sitting     Assessment:  Chronic plantar fasciitis bilateral lower extremities     Plan:  H&P reviewed conditions and went ahead today did sterile prep and injected the plantar 3 mg Kenalog 5 mg Xylocaine and one half dispensed night splint with all instructions on usage and instructed on how to sleep in the night splint and use it with ice  X-rays left because of increased pain indicate moderate increased work flatfoot deformity noted no indication stress fracture

## 2020-04-13 ENCOUNTER — Encounter (HOSPITAL_COMMUNITY): Payer: Self-pay | Admitting: Licensed Clinical Social Worker

## 2020-04-13 DIAGNOSIS — S63502A Unspecified sprain of left wrist, initial encounter: Secondary | ICD-10-CM | POA: Diagnosis not present

## 2020-04-13 DIAGNOSIS — S53402A Unspecified sprain of left elbow, initial encounter: Secondary | ICD-10-CM | POA: Diagnosis not present

## 2020-04-13 NOTE — Progress Notes (Signed)
Virtual Visit via Video Note  I connected with Jordan [name] on 04/13/20 at  9:00 AM EST by a video enabled telemedicine application and verified that I am speaking with the correct person using two identifiers.  Location: Patient: home Provider: office   I discussed the limitations of evaluation and management by telemedicine and the availability of in person appointments. The patient expressed understanding and agreed to proceed. Type of Therapy: Individual Therapy   Treatment Goals addressed: "help me cope with my mood and all of the changes I am going through"   Interventions: Motivational Interviewing   Summary: Jordan [name] is a 40 y.o. female who presents with Moderate mixed bipolar I disorder   Suicidal/Homicidal: No -without intent/plan   Therapist Response:   Saray met with clinician for an individual session. Tabia discussed her psychiatric symptoms, her current life events and her homework. Latrenda shared that she continues to struggle with high stress over finances, frustration about her nieces being in her home, and concerns about being away from her girlfriend. Clinician utilized MI OARS to reflect and summarize thoughts and feelings. Clinician discussed coping skills, meditation, mindfulness, etc. Clinician also discussed options for having the nieces live somewhere else. Avalynne shared that she is ready to give them a 60 day notice that they have to leave. Clinician explored the pros and cons of this decision and noted that there are many more cons for Luetta if they stay than if they go somewhere else. Clinician explored options for where the girls would go. Tekeisha shared that the younger one would go home to her mother and the older one would have to find an apartment or stay with friends. Clinician also processed challenges in her relationship with plans to possibly move to [place].      Plan: Return again in 2-3 weeks.     Diagnosis: Axis I: Moderate mixed  bipolar I disorder    I discussed the assessment and treatment plan with the patient. The patient was provided an opportunity to ask questions and all were answered. The patient agreed with the plan and demonstrated an understanding of the instructions.   The patient was advised to call back or seek an in-person evaluation if the symptoms worsen or if the condition fails to improve as anticipated.  I provided 45 minutes of non-face-to-face time during this encounter.   [name], LCSW

## 2020-04-17 NOTE — Progress Notes (Signed)
Internal Medicine Clinic Attending  Case discussed with Dr. Agyei at the time of the visit.  We reviewed the resident's history and exam and pertinent patient test results.  I agree with the assessment, diagnosis, and plan of care documented in the resident's note.    

## 2020-04-18 ENCOUNTER — Telehealth (INDEPENDENT_AMBULATORY_CARE_PROVIDER_SITE_OTHER): Payer: Medicare Other | Admitting: Psychiatry

## 2020-04-18 ENCOUNTER — Encounter (HOSPITAL_COMMUNITY): Payer: Self-pay | Admitting: Psychiatry

## 2020-04-18 ENCOUNTER — Other Ambulatory Visit: Payer: Self-pay

## 2020-04-18 DIAGNOSIS — F419 Anxiety disorder, unspecified: Secondary | ICD-10-CM | POA: Diagnosis not present

## 2020-04-18 DIAGNOSIS — F3132 Bipolar disorder, current episode depressed, moderate: Secondary | ICD-10-CM

## 2020-04-18 DIAGNOSIS — F4310 Post-traumatic stress disorder, unspecified: Secondary | ICD-10-CM | POA: Diagnosis not present

## 2020-04-18 MED ORDER — DIVALPROEX SODIUM ER 250 MG PO TB24: 750.0000 mg | ORAL_TABLET | Freq: Every day | ORAL | 2 refills | Status: DC

## 2020-04-18 MED ORDER — HYDROXYZINE HCL 10 MG PO TABS: ORAL_TABLET | ORAL | 2 refills | Status: DC
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
## 2020-04-19 DIAGNOSIS — M545 Low back pain, unspecified: Secondary | ICD-10-CM | POA: Diagnosis not present

## 2020-04-19 DIAGNOSIS — M5416 Radiculopathy, lumbar region: Secondary | ICD-10-CM | POA: Diagnosis not present

## 2020-04-25 DIAGNOSIS — M25522 Pain in left elbow: Secondary | ICD-10-CM | POA: Diagnosis not present

## 2020-04-25 DIAGNOSIS — S63502A Unspecified sprain of left wrist, initial encounter: Secondary | ICD-10-CM | POA: Diagnosis not present

## 2020-04-26 ENCOUNTER — Encounter (HOSPITAL_COMMUNITY): Payer: Self-pay | Admitting: Licensed Clinical Social Worker

## 2020-04-26 ENCOUNTER — Other Ambulatory Visit: Payer: Self-pay

## 2020-04-26 ENCOUNTER — Ambulatory Visit (INDEPENDENT_AMBULATORY_CARE_PROVIDER_SITE_OTHER): Payer: Medicare Other | Admitting: Licensed Clinical Social Worker

## 2020-04-26 DIAGNOSIS — F3132 Bipolar disorder, current episode depressed, moderate: Secondary | ICD-10-CM | POA: Diagnosis not present

## 2020-04-26 NOTE — Progress Notes (Signed)
Virtual Visit via Video Note  I connected with Athalie Newhard Cerone on 04/26/20 at 10:00 AM EST by a video enabled telemedicine application and verified that I am speaking with the correct person using two identifiers.  Location: Patient: home Provider: office   I discussed the limitations of evaluation and management by telemedicine and the availability of in person appointments. The patient expressed understanding and agreed to proceed.  Type of Therapy: Individual Therapy   Treatment Goals addressed: "help me cope with my mood and all of the changes I am going through". Madai will experience fewer incidents of mood outbursts, anger, and frustration at home 4 out of 7 days, per self report.    Interventions: Motivational Interviewing   Summary: ARCENIA SCARBRO is a 40 y.o. female who presents with Moderate mixed bipolar I disorder   Suicidal/Homicidal: No -without intent/plan   Therapist Response:   Margarete met with clinician for an individual session. Jozie discussed her psychiatric symptoms, her current life events and her homework. Navpreet shared that she has finally been able to get her [employer] worker to give her authorization to move. Clinician processed thoughts and feelings using MI OARS. Clinician reflected the relief of the approval, but then noted the challenges in finding housing that is acceptable to her. Audri shared that her doctor is recommending for her to get a flat, with no stairs, for safety and to avoid any falls. Clinician explored plans for nieces. Myiesha shared that her older niece has started looking at townhomes to rent, and the younger will have to go back home to her mother. Clinician processed thoughts and feelings about moving, as well as thoughts about girlfriend and son moving in with her. Clinician processed the difference in relationships, noting that gf helps her, cares for her, and "has been invited". Whereas, nieces have been "squatting" and they do not help,  only take away from [place].       Plan: Return again in 2-3 weeks.     Diagnosis: Axis I: Moderate mixed bipolar I disorder      I discussed the assessment and treatment plan with the patient. The patient was provided an opportunity to ask questions and all were answered. The patient agreed with the plan and demonstrated an understanding of the instructions.   The patient was advised to call back or seek an in-person evaluation if the symptoms worsen or if the condition fails to improve as anticipated.  I provided 45 minutes of non-face-to-face time during this encounter.   [name], LCSW

## 2020-04-28 DIAGNOSIS — D509 Iron deficiency anemia, unspecified: Secondary | ICD-10-CM | POA: Diagnosis not present

## 2020-04-28 DIAGNOSIS — R7303 Prediabetes: Secondary | ICD-10-CM | POA: Diagnosis not present

## 2020-04-28 DIAGNOSIS — R739 Hyperglycemia, unspecified: Secondary | ICD-10-CM | POA: Diagnosis not present

## 2020-04-28 DIAGNOSIS — E559 Vitamin D deficiency, unspecified: Secondary | ICD-10-CM | POA: Diagnosis not present

## 2020-04-28 DIAGNOSIS — Z131 Encounter for screening for diabetes mellitus: Secondary | ICD-10-CM | POA: Diagnosis not present

## 2020-05-05 ENCOUNTER — Other Ambulatory Visit: Payer: Self-pay | Admitting: Student

## 2020-05-05 DIAGNOSIS — M797 Fibromyalgia: Secondary | ICD-10-CM

## 2020-05-10 ENCOUNTER — Encounter (HOSPITAL_COMMUNITY): Payer: Self-pay | Admitting: Licensed Clinical Social Worker

## 2020-05-10 ENCOUNTER — Ambulatory Visit (INDEPENDENT_AMBULATORY_CARE_PROVIDER_SITE_OTHER): Payer: Medicare Other | Admitting: Licensed Clinical Social Worker

## 2020-05-10 ENCOUNTER — Other Ambulatory Visit: Payer: Self-pay

## 2020-05-10 DIAGNOSIS — F3132 Bipolar disorder, current episode depressed, moderate: Secondary | ICD-10-CM

## 2020-05-10 NOTE — Progress Notes (Signed)
THERAPIST PROGRESS NOTE  Session Time: 10:00am-10:50am  Participation Level: Active  Behavioral Response: Well GroomedAlertIrritable  Type of Therapy: Individual Therapy Type of Therapy: Individual Therapy   Treatment Goals addressed: "help me cope with my mood and all of the changes I am going through". Jordan [name] will experience fewer incidents of mood outbursts, anger, and frustration at home 4 out of 7 days, per self report.    Interventions: Motivational Interviewing   Summary: Jordan [name] is a 41 y.o. female who presents with Moderate mixed bipolar I disorder   Suicidal/Homicidal: No -without intent/plan   Therapist Response:   Jordan [name] met with clinician for an individual session. Jordan [name] discussed her psychiatric symptoms, her current life events and her homework. Jordan [name] shared that she is done with her nieces and she is ready for them to move out. She reports she has already had the conversations with them and she will allow the younger to finish the school year with her, but her older niece will be out by May. Clinician utilized MI OARS to reflect and summarize thoughts and feelings about that conversation, as well as any feelings about having to get to this point. Clinician explored updates on girlfriend and son, who are now planning to move in with Jordan [name] this summer. Clinician explored what might happen if they start acting the way that the nieces have acted in her home. Clinician also processed the importance of making sure the communication is intact for Jordan [name] to be able to really express her feelings when they are here. Clinician validated concerns that raising another young child may feel overwhelming to Jordan [name]. Clinician also identified that if she is not willing to be in it for the long haul, she needs to speak up before they move here.    Plan: Return again in 2-3 weeks.     Diagnosis: Axis I: Moderate mixed bipolar I disorder    Jordan Curling,  LCSW 05/10/2020

## 2020-05-20 ENCOUNTER — Other Ambulatory Visit (HOSPITAL_BASED_OUTPATIENT_CLINIC_OR_DEPARTMENT_OTHER): Payer: Self-pay

## 2020-05-23 ENCOUNTER — Ambulatory Visit (INDEPENDENT_AMBULATORY_CARE_PROVIDER_SITE_OTHER): Payer: Medicare Other | Admitting: Student

## 2020-05-23 ENCOUNTER — Encounter: Payer: Self-pay | Admitting: Student

## 2020-05-23 ENCOUNTER — Other Ambulatory Visit (HOSPITAL_BASED_OUTPATIENT_CLINIC_OR_DEPARTMENT_OTHER): Payer: Self-pay

## 2020-05-23 ENCOUNTER — Ambulatory Visit: Payer: Medicare Other | Attending: Internal Medicine

## 2020-05-23 ENCOUNTER — Other Ambulatory Visit: Payer: Self-pay

## 2020-05-23 VITALS — BP 111/52 | HR 66 | Temp 98.4°F | Ht 69.0 in | Wt 272.1 lb

## 2020-05-23 DIAGNOSIS — M71562 Other bursitis, not elsewhere classified, left knee: Secondary | ICD-10-CM

## 2020-05-23 DIAGNOSIS — M7050 Other bursitis of knee, unspecified knee: Secondary | ICD-10-CM | POA: Insufficient documentation

## 2020-05-23 DIAGNOSIS — D509 Iron deficiency anemia, unspecified: Secondary | ICD-10-CM

## 2020-05-23 DIAGNOSIS — E785 Hyperlipidemia, unspecified: Secondary | ICD-10-CM | POA: Diagnosis not present

## 2020-05-23 DIAGNOSIS — D259 Leiomyoma of uterus, unspecified: Secondary | ICD-10-CM

## 2020-05-23 DIAGNOSIS — E611 Iron deficiency: Secondary | ICD-10-CM | POA: Diagnosis not present

## 2020-05-23 DIAGNOSIS — Z800 Family history of malignant neoplasm of digestive organs: Secondary | ICD-10-CM | POA: Diagnosis not present

## 2020-05-23 DIAGNOSIS — Z23 Encounter for immunization: Secondary | ICD-10-CM

## 2020-05-23 DIAGNOSIS — M25522 Pain in left elbow: Secondary | ICD-10-CM | POA: Diagnosis not present

## 2020-05-23 DIAGNOSIS — S83002A Unspecified subluxation of left patella, initial encounter: Secondary | ICD-10-CM | POA: Diagnosis not present

## 2020-05-23 DIAGNOSIS — M797 Fibromyalgia: Secondary | ICD-10-CM | POA: Diagnosis not present

## 2020-05-23 DIAGNOSIS — Z6841 Body Mass Index (BMI) 40.0 and over, adult: Secondary | ICD-10-CM

## 2020-05-23 DIAGNOSIS — M25532 Pain in left wrist: Secondary | ICD-10-CM | POA: Diagnosis not present

## 2020-05-23 MED ORDER — PREGABALIN 150 MG PO CAPS: 150.0000 mg | ORAL_CAPSULE | Freq: Two times a day (BID) | ORAL | 2 refills | Status: DC

## 2020-05-23 MED ORDER — DICLOFENAC SODIUM 1 % EX GEL: 4.0000 g | Freq: Four times a day (QID) | CUTANEOUS | 2 refills | Status: DC

## 2020-05-23 NOTE — Assessment & Plan Note (Signed)
Patient's left knee pain and tenderness are consistent with pes anserine bursitis, likely in the setting of recent trauma vs. Initiation of new intense exercise program.   - Provided educational materials in discharge instructions - Recommend rest from exercise program until symptoms improve  - Continue RICE and Naproxen PRN  - Prescribed Voltaren gel PRN  - Recommend orthopedic follow up if no improvement with above

## 2020-05-23 NOTE — Assessment & Plan Note (Signed)
Patient was noted to have high cholesterol with LDL of 130 on last lipid profile 06/2019. Her ASCVD risk was <7.5% so she was not started on a statin at that time.   - Repeat lipid profile

## 2020-05-23 NOTE — Progress Notes (Signed)
   Covid-19 Vaccination Clinic  Name:  Jordan [name]    MRN: [id number] DOB: 04-13-1980  05/23/2020  [name] was observed post Covid-19 immunization for 15 minutes without incident. She was provided with Vaccine Information Sheet and instruction to access the V-Safe system.   [name] was instructed to call 911 with any severe reactions post vaccine: Marland Kitchen Difficulty breathing  . Swelling of face and throat  . A fast heartbeat  . A bad rash all over body  . Dizziness and weakness   Immunizations Administered    Name Date Dose VIS Date Route   PFIZER Comrnaty(Gray TOP) Covid-19 Vaccine 05/23/2020 11:30 AM 0.3 mL 01/28/2020 Intramuscular   Manufacturer: [place]   Lot: [id number]   [place]: [phone]

## 2020-05-23 NOTE — Patient Instructions (Addendum)
Ms. Cowper,   I am so glad to hear you've established a plan moving forward on your journey to weight loss! It's a long road but keep up the great work.   Your knee pain is most consistent with "Pes Anserine Bursitis". This can occur with trauma or repetitive movements of your knee and typically worsens walking up steps as you have described. You may also have subluxation of your knee cap (where it moves out of place on extension).   I will prescribe Voltaren gel. Please apply this up to 4 times a day as needed for pain. You may continue to take Naproxen for your pain. It would be best to rest your knee for a couple of days to allow for healing. If your knee pain were to persist, it is possible to get injections of steroids into your knee to calm the joint. Please notify your orthopedist if this is something you are interested if stretching and rest are insufficient in relieving your pain.   Today, we will check your cholesterol and repeat your iron studies. You may stop taking your iron for now and I will let you know if this is something you will need to take moving forward. Please be sure to follow up with your gynecologist regarding your abnormal menstrual bleeding.   I will call you with results when available. Please schedule a follow up appointment in 3 months or sooner if you have any other concerns.   Thank you and take care,  Dr. Konrad Penta    Pes Anserine Bursitis  The pes anserine is an area on the inside of your knee, just below the joint, that is cushioned by a fluid-filled sac (bursa). Pes anserine bursitis is a condition that happens when the bursa gets swollen and irritated. The condition causes knee pain. What are the causes? This condition may be caused by:  Making the same movement over and over.  A direct hit (trauma) to the inside of the leg. What increases the risk? You are more likely to develop this condition if you:  Are a runner.  Play sports that involve a lot  of running and quick side-to-side movements (cutting).  Are an athlete who plays contact sports.  Swim using an inward angle of the knee, such as with the breaststroke.  Have tight hamstring muscles.  Are a woman.  Are overweight.  Have flat feet.  Have diabetes or osteoarthritis. What are the signs or symptoms? Symptoms of this condition include:  Knee pain that gets better with rest and worse with activities like climbing stairs, walking, running, or getting in and out of a chair.  Swelling.  Warmth.  Tenderness when pressing at the inside of the lower leg, just below the knee. How is this diagnosed? This condition may be diagnosed based on:  Your symptoms.  Your medical history.  A physical exam. ? During your physical exam, your health care provider will press on the tendon attachment to see if you feel pain. ? Your health care provider may also check your hip and knee motion and strength.  Tests to check for swelling and fluid buildup in the bursa and to look at muscles, bones, and tendons. These tests might include: ? X-rays. ? MRI. ? Ultrasound. How is this treated? This condition may be treated by:  Resting your knee. You may be told to raise (elevate) your knee while resting.  Avoiding activities that cause pain.  Icing the inside of your knee.  Sleeping with  a pillow between your knees. This will cushion your injured knee.  Taking medicine by mouth (orally) to reduce pain and swelling or having medicine injected into your knee.  Doing strengthening and stretching exercises (physical therapy). If these treatments do not work or if the condition keeps coming back, you may need to have surgery to remove the bursa. Follow these instructions at home: Managing pain, stiffness, and swelling  If directed, put ice on the injured area. ? Put ice in a plastic bag. ? Place a towel between your skin and the bag. ? Leave the ice on for 20 minutes, 2-3 times a  day.  Elevate the injured area above the level of your heart while you are sitting or lying down.   Activity  Return to your normal activities as told by your health care provider. Ask your health care provider what activities are safe for you.  Do exercises as told by your health care provider. General instructions  Take over-the-counter and prescription medicines only as told by your health care provider.  Sleep with a pillow between your knees.  Do not use any products that contain nicotine or tobacco, such as cigarettes, e-cigarettes, and chewing tobacco. These can delay healing. If you need help quitting, ask your health care provider.  If you are overweight, work with your health care provider and a dietitian to set a weight-loss goal that is healthy and reasonable for you.  Keep all follow-up visits as told by your health care provider. This is important. How is this prevented?  When exercising, make sure that you: ? Warm up and stretch before being active. ? Cool down and stretch after being active. ? Give your body time to rest between periods of activity. ? Use equipment that fits you. ? Are safe and responsible while being active to avoid falls. ? Do at least 150 minutes of moderate-intensity exercise each week, such as brisk walking or water aerobics. ? Maintain physical fitness, including:  Strength.  Flexibility.  Cardiovascular fitness.  Endurance. ? Maintain a healthy weight. Contact a health care provider if:  Your symptoms do not improve.  Your symptoms get worse. Summary  Pes anserine bursitis is a condition that happens when the fluid-filled sac (bursa) at the inside of your knee gets swollen and irritated. The condition causes knee pain.  Treatment for pes anserine bursitis may include resting your knee, icing the inside of your knee, sleeping with a pillow between your knees, taking medicine by mouth or by injection, and doing strengthening and  stretching exercises (physical therapy).  Follow instructions for managing pain, stiffness, and swelling.  Take over-the-counter and prescription medicines only as told by your health care provider. This information is not intended to replace advice given to you by your health care provider. Make sure you discuss any questions you have with your health care provider. Document Revised: 05/29/2018 Document Reviewed: 07/17/2017 Elsevier Patient Education  2021 [place].   Pes Anserine Bursitis Rehab Ask your health care provider which exercises are safe for you. Do exercises exactly as told by your health care provider and adjust them as directed. It is normal to feel mild stretching, pulling, tightness, or discomfort as you do these exercises. Stop right away if you feel sudden pain or your pain gets worse. Do not begin these exercises until told by your health care provider. Stretching and range-of-motion exercises These exercises warm up your muscles and joints and improve the movement and flexibility of your knee. These  exercises also help to relieve pain and stiffness. Hamstring stretch, doorway 1. Lie on your back in front of a doorway with your left / right leg resting against the wall and your other leg flat on the floor in the doorway. There should be a slight bend in your left / right knee. 2. Straighten your left / right knee. You should feel a stretch behind your knee or thigh (hamstring). If you do not, scoot your buttocks closer to the door. 3. Hold this position for __________ seconds. Repeat __________ times. Complete this exercise __________ times a day.   Seated stretch This exercise is sometimes called hamstrings and adductors stretch. 1. Sit on the floor with your legs stretched wide. Keep your knees straight during this exercise. 2. Keeping your head and back in a straight line, bend at your waist to reach for your left foot (position A). You should feel a stretch in your  right inner thigh (adductors). 3. Hold for __________ seconds. Then slowly return to the upright position. 4. Keeping your head and back in a straight line, bend at your waist to reach forward (position B). You should feel a stretch behind both of your thighs or knees (hamstrings). 5. Hold for __________ seconds. Then slowly return to the upright position. 6. Keeping your head and back in a straight line, bend at your waist to reach for your right foot (position C). You should feel a stretch in your left inner thigh (adductors). 7. Hold for __________ seconds. Then slowly return to the upright position. Repeat __________ times. Complete this exercise __________ times a day.   Quadriceps, prone 1. Lie on your abdomen on a firm surface, such as a bed or padded floor (prone position). 2. Bend your left / right knee and hold your ankle. If you cannot reach your ankle or pant leg, loop a belt around your foot and grab the belt instead. 3. Gently pull your heel toward your buttocks. Your knee should not slide out to the side. You should feel a stretch in the front of your thigh and knee (quadriceps). 4. Hold this position for __________ seconds. Repeat __________ times. Complete this exercise __________ times a day.   Strengthening exercises These exercises build strength and endurance in your knee and hip. Endurance is the ability to use your muscles for a long time, even after they get tired. Quadriceps terminal knee extension 1. Secure a long loop of rubber exercise band around a sturdy object like a table leg. 2. Put the band behind your left / right knee. Step back from where the band is secured to put tension on the band. 3. Slowly bend your left / right knee. Keep your left / right foot flat on the floor. 4. Tighten the muscle in the front of your thigh (quadriceps) and push back against the band to straighten your knee until it is completely straight (terminal extension). 5. Hold this position  for __________ seconds. 6. Return to having your left / right knee bent. Repeat __________ times. Complete this exercise __________ times a day. Straight leg raises, side-lying This exercise strengthens the muscles that rotate the leg at the hip and move it away from your body (hip abductors). 1. Lie on your side with your left / right leg in the top position. Lie so your head, shoulder, hip, and knee line up. You may bend your bottom knee to help you balance. 2. Lift your top leg 4-6 inches (10-15 cm) while keeping your toes  pointed straight ahead. 3. Hold this position for __________ seconds. 4. Slowly lower your leg to the starting position. 5. Allow your muscles to relax completely after each repetition. Repeat __________ times. Complete this exercise __________ times a day. This information is not intended to replace advice given to you by your health care provider. Make sure you discuss any questions you have with your health care provider. Document Revised: 05/29/2018 Document Reviewed: 11/28/2017 Elsevier Patient Education  2021 [place].   Patellar Dislocation and Subluxation The kneecap (patella) is located in a groove at the end of the thigh bone (femur). Patellar dislocation and patellar subluxation are injuries that happen when the patella slips out of its normal position.  In a patellar subluxation, the patella slips partly out of the groove.  In a patellar dislocation, the patella slips all the way out of the groove. What are the causes? This condition may be caused by:  A hit to the knee.  Twisting the knee when the foot is planted. What increases the risk? You are more likely to develop this condition if you:  Are an athlete in your teens or 49s.  Have had this condition before.  Play certain kinds of sports, including sports that: ? Include quick turns, quick changes in direction, or contact with other players, like soccer. ? Require jumping, such as  basketball or volleyball. ? Require that cleats are worn. What are the signs or symptoms? Symptoms of this condition include:  A feeling that the knee is buckling, followed by sudden extreme pain in the knee.  A deformed knee.  A popping sensation, followed by a feeling that something is out of place.  Inability to bend or straighten the knee.  Swelling in the knee. How is this diagnosed? This condition may be diagnosed with:  A physical exam.  An X-ray. This may be done to see the position of the patella or to see if a bone has broken.  An MRI. This may be done to look at the alignment of your knee and the ligaments that hold your patella in place. How is this treated? Your patella may move back into place on its own when you straighten your knee. If your patella does not move back into place on its own, your health care provider will move it back into place. After your patella is back in its normal position, you may be able to go back to your normal activities, or you may be treated further by:  Wearing a knee brace to keep your knee from moving (immobilized) while it heals.  Doing exercises that help improve strength and movement in your knee.  Taking medicine to help with pain and inflammation.  Applying ice to the knee to help with pain and inflammation.  Having surgery to prevent the patella from slipping out of place or to clean out any loose cartilage in your joint. This may be needed if other treatments do not help or if the condition keeps happening. Follow these instructions at home: If you have a brace:  Wear the brace as told by your health care provider. Remove it only as told by your health care provider.  Loosen the brace if your toes tingle, become numb, or turn cold and blue.  Keep the brace clean.  If the brace is not waterproof: ? Do not let it get wet. ? Cover it with a watertight covering when you take a bath or shower. Managing pain, stiffness,  and swelling  If directed, put ice on the affected area. ? If you have a removable brace, remove it as told by your health care provider. ? Put ice in a plastic bag. ? Place a towel between your skin and the bag. ? Leave the ice on for 20 minutes, 2-3 times a day.  Move your toes often to reduce stiffness and swelling.  Raise (elevate) the injured area above the level of your heart while you are sitting or lying down.   Activity  Do not use the injured limb to support your body weight until your health care provider says that you can. Use crutches as told by your health care provider.  Return to your normal activities as told by your health care provider. Ask your health care provider what activities are safe for you.  Do exercises as told by your health care provider. General instructions  Take over-the-counter and prescription medicines only as told by your health care provider.  Do not use any products that contain nicotine or tobacco, such as cigarettes, e-cigarettes, and chewing tobacco. These can delay healing. If you need help quitting, ask your health care provider.  Keep all follow-up visits as told by your health care provider. This is important. How is this prevented?  Warm up and stretch before being active.  Cool down and stretch after being active.  Give your body time to rest between periods of activity.  Make sure to use equipment that fits you.  Be safe and responsible while being active. This will help you avoid falls.  Do at least 150 minutes of moderate-intensity exercise each week, such as brisk walking or water aerobics.  Maintain physical fitness, including: ? Strength. ? Flexibility. ? Cardiovascular fitness. ? Endurance. Contact a health care provider if:  The pain in your knee gets worse and is not relieved by medicine.  Your knee catches or locks. Get help right away if:  Your patella slips out of its normal position again.  The swelling  in your knee gets worse. Summary  Patellar dislocation and patellar subluxation are injuries that happen when the patella slips out of its normal position.  If your patella does not move back into place on its own, your health care provider will move it back into place.  Return to your normal activities as told by your health care provider. Ask your health care provider what activities are safe for you.  Do not use the injured limb to support your body weight until your health care provider says that you can. Use crutches as told by your health care provider.  Keep all follow-up visits as told by your health care provider. This is important. This information is not intended to replace advice given to you by your health care provider. Make sure you discuss any questions you have with your health care provider. Document Revised: 06/03/2018 Document Reviewed: 12/30/2017 Elsevier Patient Education  2021 [employer].
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
## 2020-05-23 NOTE — Assessment & Plan Note (Signed)
Patient has been able to follow with [facility]'s weight loss clinic since her last visit. She has started Ozempic and started an intensive physical exercise program.   - Congratulated her on significant strides since her last appointment - Continue to encourage portion control and regular exercise as tolerated

## 2020-05-23 NOTE — Assessment & Plan Note (Addendum)
Patient has very mild spontaneous subluxation of her left patella upon extension of her leg. She has no pain associated with this. Notes she does have weak leg musculature and just started a new intensive exercise program.   - Recommend gradual return to exercise as tolerated  - Provided quadricept strengthening and stretching exercises  - Continue to monitor progress with PT

## 2020-05-23 NOTE — Assessment & Plan Note (Signed)
Patient has a history of IDA in the setting of menometrorrhagia 2/2 fibroids. Her gynecologist recommended hysterectomy, although patient is averse to additional surgeries and has not yet followed up on this. Last visit, she was started on PO iron replacement due to borderline deficiency. She completed 3 months of replacement.   - Will repeat iron studies today  - Continue to encourage GYN follow up

## 2020-05-24 ENCOUNTER — Ambulatory Visit (INDEPENDENT_AMBULATORY_CARE_PROVIDER_SITE_OTHER): Payer: Medicare Other | Admitting: Licensed Clinical Social Worker

## 2020-05-24 DIAGNOSIS — F3132 Bipolar disorder, current episode depressed, moderate: Secondary | ICD-10-CM

## 2020-05-24 LAB — LIPID PANEL
Chol/HDL Ratio: 2.4 ratio (ref 0.0–4.4)
Cholesterol, Total: 228 mg/dL — ABNORMAL HIGH (ref 100–199)
HDL: 96 mg/dL (ref >39)
LDL Chol Calc (NIH): 116 mg/dL — ABNORMAL HIGH (ref 0–99)
Triglycerides: 95 mg/dL (ref 0–149)
VLDL Cholesterol Cal: 16 mg/dL (ref 5–40)

## 2020-05-24 LAB — IRON AND TIBC
Iron Saturation: 21 % (ref 15–55)
Iron: 61 ug/dL (ref 27–159)
Total Iron Binding Capacity: 286 ug/dL (ref 250–450)
UIBC: 225 ug/dL (ref 131–425)

## 2020-05-24 LAB — FERRITIN: Ferritin: 44 ng/mL (ref 15–150)

## 2020-05-25 ENCOUNTER — Encounter (HOSPITAL_COMMUNITY): Payer: Self-pay | Admitting: Licensed Clinical Social Worker

## 2020-05-25 NOTE — Progress Notes (Signed)
THERAPIST PROGRESS NOTE  Session Time: 9:00am-9:45am  Participation Level: Active  Behavioral Response: NeatAlertDysphoric  Type of Therapy: Individual Therapy  Treatment Goals addressed: "help me cope with my mood and all of the changes I am going through". [name] will experience fewer incidents of mood outbursts, anger, and frustration at home 4 out of 7 days, per self report.    Interventions: Motivational Interviewing   Summary: Jordan [name] is a 40 y.o. female who presents with Moderate mixed bipolar I disorder   Suicidal/Homicidal: No -without intent/plan   Therapist Response:   Cypress met with clinician for an individual session. [name] discussed her psychiatric symptoms, her current life events and her homework. [name] shared that her partner came to visit over the weekend and it was not a good weekend. Clinician explored the interactions using MI OARS. Clinician reflected that logistically it was challenging since [name] had football games all weekend and she had to work. Clinician discussed ways to communicate better with partner about when it is a good time to visit and when she will be too busy. Clinician also identified the importance of being able to live with some sad feelings and get through it. Clinician discussed coping skills, learning how to smooth things over with partner, and being able to plan better for the next visit. Clinician explored feelings about partner moving to [place]. [name] shared concerns about the son, who has Autism Spectrum Disorder, and her role in childrearing. [name] agreed that once they are here, she will work with partner to establish good boundaries and structure for the little boy.    Plan: Return again in 2-3 weeks.     Diagnosis: Axis I: Moderate mixed bipolar I disorder    [name], LCSW 05/25/2020

## 2020-05-27 ENCOUNTER — Other Ambulatory Visit (HOSPITAL_BASED_OUTPATIENT_CLINIC_OR_DEPARTMENT_OTHER): Payer: Self-pay

## 2020-05-27 ENCOUNTER — Telehealth: Payer: Self-pay | Admitting: Student

## 2020-05-27 ENCOUNTER — Other Ambulatory Visit: Payer: Self-pay | Admitting: Student

## 2020-05-27 ENCOUNTER — Telehealth: Payer: Self-pay | Admitting: Internal Medicine

## 2020-05-27 NOTE — Telephone Encounter (Signed)
   Reason for call:   I received a call from [name] at 7 PM indicating left knee pain.   Pertinent Data:   This is a 40 year old female with a history of patellar subluxation who states that she stood up earlier today and her left kneecap popped out of place, it started swelling and is now swelling down her leg, is a 6 out of 10, worse with moving it, and is not able to walk on it.  She denies any warmth or redness.  She has been using ice on the area and the swelling has improved however the dislocation of her kneecap has not improved.  She states that she has been doing more squats and lunges recently, and has PT.  When she normally has dislocation of her patellar she does not have any pain and has no difficulty walking on it.  She was asking if she needed to go into urgent care to be evaluated.  Denied any fevers, chills, or other symptoms.   Assessment / Plan / Recommendations:   40 year old female with a history of patellar subluxation calling in to discuss her left knee pain, dislocation of her patellar, states worsening swelling, and inability to walk on that side.  This appears to be different than her typical subluxation symptoms, and the inability to walk, swelling, and dislocation are concerning for patella dislocation that has failed to reduce spontaneously. Advised patient to go to UC to be evaluated, and possibly have reduction of dislocation.   As always, pt is advised that if symptoms worsen or new symptoms arise, they should go to an urgent care facility or to to ER for further evaluation.   Asencion Noble, MD   05/27/2020, 8:18 PM

## 2020-05-27 NOTE — Telephone Encounter (Signed)
Called to discuss lab results with Ms. Dona. She did have LDL > 100 although ASCVD risk is very low at 0.1% and she does not have DM so does not require statin currently. She is awaiting hearing back from a dietician although is doing very well on her weight loss journey. Her iron studies did not show overt iron deficiency; however, given little improvement despite oral replacement in the setting of menorrhagia, discussed that she should continue taking PO supplementation daily for now. She does continue to struggle with persistent knee pain, unchanged since last visit although is not taking anything OTC, just picked up Voltaren gel, and has PT scheduled tomorrow.   Plan:  - Advised her to schedule follow up with her GYN for menorrhagia  - Reassess need for iron intermittently / if she is able to re-establish with GYN  - Informed her to schedule visit if Tylenol and Voltaren gel PRN, exercises, and PT do not improve her pain.   Jeralyn Bennett, MD 05/27/2020, 1:25 PM Pager: [phone]

## 2020-05-27 NOTE — Progress Notes (Signed)
Internal Medicine Clinic Attending  Case discussed with Dr. Speakman  At the time of the visit.  We reviewed the resident's history and exam and pertinent patient test results.  I agree with the assessment, diagnosis, and plan of care documented in the resident's note.  

## 2020-05-28 ENCOUNTER — Ambulatory Visit (INDEPENDENT_AMBULATORY_CARE_PROVIDER_SITE_OTHER): Payer: Medicare Other

## 2020-05-28 ENCOUNTER — Other Ambulatory Visit: Payer: Self-pay

## 2020-05-28 ENCOUNTER — Ambulatory Visit
Admission: EM | Admit: 2020-05-28 | Discharge: 2020-05-28 | Disposition: A | Discharge status: Home / Self Care | Payer: Medicare Other | Attending: Family Medicine | Admitting: Family Medicine

## 2020-05-28 DIAGNOSIS — M25562 Pain in left knee: Secondary | ICD-10-CM

## 2020-05-28 MED ORDER — METHYLPREDNISOLONE SODIUM SUCC 125 MG IJ SOLR
125.0000 mg | Freq: Once | INTRAMUSCULAR | Status: AC
  Administered 2020-05-28: 125 mg via INTRAMUSCULAR

## 2020-05-28 NOTE — ED Triage Notes (Signed)
Pt began having left  knee pain about 2 weeks ago after working out  Then last night heard pop and developed severe pain and swelling
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
## 2020-05-28 NOTE — Discharge Instructions (Addendum)
Keep your scheduled follow up with your doctor. Continue taking diclofenac.  Given today: Meds ordered this encounter  Medications   methylPREDNISolone sodium succinate (SOLU-MEDROL) 125 mg/2 mL injection 125 mg

## 2020-05-30 ENCOUNTER — Encounter: Payer: Self-pay | Admitting: Nurse Practitioner

## 2020-06-02 ENCOUNTER — Other Ambulatory Visit (HOSPITAL_BASED_OUTPATIENT_CLINIC_OR_DEPARTMENT_OTHER): Payer: Self-pay

## 2020-06-02 MED ORDER — COVID-19 MRNA VACCINE (PFIZER) 30 MCG/0.3ML IM SUSP
INTRAMUSCULAR | 0 refills | Status: DC
  Filled 2020-06-02: qty 0.3, 1d supply, fill #0

## 2020-06-07 ENCOUNTER — Other Ambulatory Visit: Payer: Self-pay

## 2020-06-07 ENCOUNTER — Encounter (HOSPITAL_COMMUNITY): Payer: Self-pay | Admitting: Licensed Clinical Social Worker

## 2020-06-07 ENCOUNTER — Ambulatory Visit (INDEPENDENT_AMBULATORY_CARE_PROVIDER_SITE_OTHER): Payer: Medicare Other | Admitting: Licensed Clinical Social Worker

## 2020-06-07 DIAGNOSIS — M25562 Pain in left knee: Secondary | ICD-10-CM | POA: Diagnosis not present

## 2020-06-07 DIAGNOSIS — F3132 Bipolar disorder, current episode depressed, moderate: Secondary | ICD-10-CM

## 2020-06-07 NOTE — Progress Notes (Signed)
   THERAPIST PROGRESS NOTE  Session Time: 9:00am-9:45am  Participation Level: Active  Behavioral Response: NeatAlertEuthymic and Irritable  Type of Therapy: Individual Therapy  Treatment Goals addressed: "help me cope with my mood and all of the changes I am going through". Jordan [name] will experience fewer incidents of mood outbursts, anger, and frustration at home 4 out of 7 days, per self report.    Interventions: Motivational Interviewing   Summary: Jordan [name] is a 40 y.o. female who presents with Moderate mixed bipolar I disorder   Suicidal/Homicidal: No -without intent/plan   Therapist Response:   Tressia met with clinician for an individual session. Jordan [name] discussed her psychiatric symptoms, her current life events and her homework. Jordan [name] shared that she is doing well with her partner and her child. However, she continues to struggle with her teenage niece, who lives with her. Clinician explored plans for once school lets out. Jordan [name] identified that her child and her niece will be moving out, daughter to her own apartment, and niece will go back to the mother. Clinician processed this decision and noted that Jordan [name] has decided to put herself first and care about her own mental and physical health. Clinician processed through this choice and noted the importance of self-care utilizing MI OARS.    Plan: Return again in 2-3 weeks.     Diagnosis: Axis I: Moderate mixed bipolar I disorder      [name], LCSW 06/07/2020

## 2020-06-14 ENCOUNTER — Telehealth: Payer: Self-pay

## 2020-06-14 ENCOUNTER — Other Ambulatory Visit: Payer: Self-pay | Admitting: Student

## 2020-06-14 ENCOUNTER — Telehealth: Payer: Self-pay | Admitting: Student

## 2020-06-14 DIAGNOSIS — N922 Excessive menstruation at puberty: Secondary | ICD-10-CM

## 2020-06-14 NOTE — Telephone Encounter (Signed)
Pls contact pt regarding a referral to OBGYN physician that accepts her insurance [phone]

## 2020-06-14 NOTE — Telephone Encounter (Signed)
Informed patient over the phone that I have sent a new referral due to Insurance coverage issues for OBGYN services (at [facility]). She is more interested in discussing surgical options for fibroids at this time.   Jeralyn Bennett, MD 06/14/2020, 4:35 PM Pager: [phone]

## 2020-06-14 NOTE — Telephone Encounter (Signed)
New OBGYN Referral requested as this patient's previous Ins is no longer accepted at [facility].  Please advise.

## 2020-06-14 NOTE — Telephone Encounter (Signed)
Rec'd call from patient needing a New referral to an [facility].  The patient had an appointment sch for tomorrow with [facility] and it was cancelled by their office because they will no longer take her ins.  Please advise if you willk place a new referral for her

## 2020-06-14 NOTE — Telephone Encounter (Signed)
RTC, message obtained that VM box has not been set up yet. SChaplin, RN,BSN

## 2020-06-14 NOTE — Addendum Note (Signed)
Addended by: Jeralyn Bennett on: 06/14/2020 04:35 PM   Modules accepted: Orders

## 2020-06-15 NOTE — Telephone Encounter (Signed)
Thank you so much

## 2020-06-16 ENCOUNTER — Ambulatory Visit (INDEPENDENT_AMBULATORY_CARE_PROVIDER_SITE_OTHER): Payer: Medicare Other | Admitting: Nurse Practitioner

## 2020-06-16 ENCOUNTER — Encounter: Payer: Self-pay | Admitting: Nurse Practitioner

## 2020-06-16 ENCOUNTER — Other Ambulatory Visit: Payer: Self-pay

## 2020-06-16 VITALS — BP 86/62 | HR 71 | Ht 69.0 in | Wt 262.0 lb

## 2020-06-16 DIAGNOSIS — Z8371 Family history of colonic polyps: Secondary | ICD-10-CM | POA: Diagnosis not present

## 2020-06-16 DIAGNOSIS — K5900 Constipation, unspecified: Secondary | ICD-10-CM | POA: Diagnosis not present

## 2020-06-16 MED ORDER — PLENVU 140 G PO SOLR: 1.0000 | Freq: Once | ORAL | 0 refills | Status: AC
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
## 2020-06-16 NOTE — Progress Notes (Signed)
Reviewed and agree with management plan.  Daly Whipkey T. Evangelyn Crouse, MD FACG [phone]  

## 2020-06-16 NOTE — Patient Instructions (Signed)
If you are age 41 or older, your body mass index should be between 23-30. Your Body mass index is 38.69 kg/m. If this is out of the aforementioned range listed, please consider follow up with your Primary Care Provider.  If you are age 90 or younger, your body mass index should be between 19-25. Your Body mass index is 38.69 kg/m. If this is out of the aformentioned range listed, please consider follow up with your Primary Care Provider.   You have been scheduled for a colonoscopy. Please follow written instructions given to you at your visit today.  Please pick up your prep supplies at the pharmacy within the next 1-3 days. If you use inhalers (even only as needed), please bring them with you on the day of your procedure.

## 2020-06-20 ENCOUNTER — Telehealth: Payer: Self-pay | Admitting: Nurse Practitioner

## 2020-06-21 ENCOUNTER — Ambulatory Visit (INDEPENDENT_AMBULATORY_CARE_PROVIDER_SITE_OTHER): Payer: Medicare Other | Admitting: Licensed Clinical Social Worker

## 2020-06-21 ENCOUNTER — Other Ambulatory Visit: Payer: Self-pay

## 2020-06-21 DIAGNOSIS — F3132 Bipolar disorder, current episode depressed, moderate: Secondary | ICD-10-CM | POA: Diagnosis not present

## 2020-06-23 NOTE — Telephone Encounter (Signed)
Lm for vm that I would leave a Plenvu sample up front to be picked up

## 2020-06-24 ENCOUNTER — Other Ambulatory Visit: Payer: Self-pay | Admitting: Student

## 2020-06-24 MED ORDER — FERROUS SULFATE 324 MG PO TBEC: 324.0000 mg | DELAYED_RELEASE_TABLET | Freq: Every day | ORAL | 0 refills | Status: DC

## 2020-06-24 NOTE — Telephone Encounter (Signed)
Called patient to notify her that her Referral Appointment has been made with the OBGYN as requested for 07/22/2020 with the [facility].    Please call the patient back as she is requesting to have her ferrous sulfate 324 MG TBEC  [id number] - [place], [place] - [address] (Ph: [phone]) refilled until her appointment with their office.  Pls call the patient back.

## 2020-06-30 ENCOUNTER — Encounter (HOSPITAL_COMMUNITY): Payer: Self-pay | Admitting: Licensed Clinical Social Worker

## 2020-06-30 NOTE — Progress Notes (Signed)
   THERAPIST PROGRESS NOTE  Session Time: 8:00am-8:50am  Participation Level: Active  Behavioral Response: Well GroomedAlertHopeless and Irritable  Type of Therapy: Individual Therapy  Treatment Goals addressed: "help me cope with my mood and all of the changes I am going through". Chanell will experience fewer incidents of mood outbursts, anger, and frustration at home 4 out of 7 days, per self report.  Interventions: Motivational Interviewing   Summary: HUE STEVESON is a 40 y.o. female who presents with Moderate mixed bipolar I disorder   Suicidal/Homicidal: No -without intent/plan   Therapist Response:   Gracynn met with clinician for an individual session. Tallie discussed her psychiatric symptoms, her current life events and her homework. Latamara shared that she continues to feel very frustrated with her living situation, noting that her nieces are still in her home, and she is "ready for them to go". Clinician discussed recent updates in behaviors and frustrations. Clinician utilized MI OARS to reflect and summarize thoughts and feelings. Clinician explored any feelings of sadness or guilt about putting them out. Clinician also noted the excitement about girlfriend moving to [place] in the summer and noted some concerns about being able to cope with them in her home. Clinician reviewed coping skills including deep breathing, taking a walk, and calling a friend.    Plan: Return again in 2-3 weeks.     Diagnosis: Axis I: Moderate mixed bipolar I disorder     [name], LCSW 06/30/2020

## 2020-07-04 ENCOUNTER — Encounter (HOSPITAL_COMMUNITY): Payer: Self-pay

## 2020-07-04 ENCOUNTER — Emergency Department (HOSPITAL_COMMUNITY)
Admission: EM | Admit: 2020-07-04 | Discharge: 2020-07-04 | Disposition: A | Discharge status: Home / Self Care | Payer: Medicare Other | Attending: Emergency Medicine | Admitting: Emergency Medicine

## 2020-07-04 ENCOUNTER — Other Ambulatory Visit: Payer: Self-pay

## 2020-07-04 DIAGNOSIS — Z20822 Contact with and (suspected) exposure to covid-19: Secondary | ICD-10-CM | POA: Insufficient documentation

## 2020-07-04 DIAGNOSIS — J069 Acute upper respiratory infection, unspecified: Secondary | ICD-10-CM | POA: Insufficient documentation

## 2020-07-04 DIAGNOSIS — B9789 Other viral agents as the cause of diseases classified elsewhere: Secondary | ICD-10-CM | POA: Diagnosis not present

## 2020-07-04 DIAGNOSIS — R059 Cough, unspecified: Secondary | ICD-10-CM | POA: Diagnosis present

## 2020-07-04 LAB — RESP PANEL BY RT-PCR (FLU A&B, COVID) ARPGX2
Influenza A by PCR: NEGATIVE
Influenza B by PCR: NEGATIVE
SARS Coronavirus 2 by RT PCR: NEGATIVE

## 2020-07-04 MED ORDER — FLUTICASONE PROPIONATE 50 MCG/ACT NA SUSP: 1.0000 | Freq: Every day | NASAL | 2 refills | Status: DC

## 2020-07-04 MED ORDER — BENZONATATE 200 MG PO CAPS: 200.0000 mg | ORAL_CAPSULE | Freq: Three times a day (TID) | ORAL | 0 refills | Status: AC

## 2020-07-04 MED ORDER — CETIRIZINE-PSEUDOEPHEDRINE ER 5-120 MG PO TB12: 1.0000 | ORAL_TABLET | Freq: Every day | ORAL | 0 refills | Status: DC
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
## 2020-07-04 NOTE — Discharge Instructions (Signed)
Follow up in your mychart account for your COVID/flu test results.  Quarantine until you know your results. Follow return to work guidelines once you know your test results.   Saline sinus rinse twice daily. Flonase twice daily for 5 days then continue with daily use. Zyrtec D as prescribed.

## 2020-07-04 NOTE — ED Triage Notes (Signed)
Pt presents with nasal congestion, cough and sore throat x6 days. Taking OTC meds w/no relief

## 2020-07-04 NOTE — ED Provider Notes (Signed)
Emergency Medicine Provider Triage Evaluation Note  Jordan [name] , a 40 y.o. female  was evaluated in triage.  Pt complains of congestion and cough x 5 days, taking OTC meds without relief.  Review of Systems  Positive: Cough, congestion  Negative: Body aches, fever  Physical Exam  There were no vitals taken for this visit. Gen:   Awake, no distress   Resp:  Normal effort  MSK:   Moves extremities without difficulty  Other:  Lungs CTA  Medical Decision Making  Medically screening exam initiated at 7:33 AM.  Appropriate orders placed.  Jordan [name] was informed that the remainder of the evaluation will be completed by another provider, this initial triage assessment does not replace that evaluation, and the importance of remaining in the ED until their evaluation is complete.     Tacy Learn, PA-C 07/04/20 1552    Sherwood Gambler, MD 07/05/20 [phone]

## 2020-07-05 ENCOUNTER — Ambulatory Visit (INDEPENDENT_AMBULATORY_CARE_PROVIDER_SITE_OTHER): Payer: Medicare Other | Admitting: Licensed Clinical Social Worker

## 2020-07-05 DIAGNOSIS — F3162 Bipolar disorder, current episode mixed, moderate: Secondary | ICD-10-CM

## 2020-07-06 ENCOUNTER — Other Ambulatory Visit: Payer: Self-pay | Admitting: Student

## 2020-07-06 ENCOUNTER — Encounter (HOSPITAL_COMMUNITY): Payer: Self-pay | Admitting: Licensed Clinical Social Worker

## 2020-07-06 DIAGNOSIS — M797 Fibromyalgia: Secondary | ICD-10-CM

## 2020-07-06 NOTE — Progress Notes (Signed)
   THERAPIST PROGRESS NOTE  Session Time: 8:10am-9:00am  Participation Level: Active  Behavioral Response: Well GroomedAlertAnxious, Depressed and Irritable  Type of Therapy: Individual Therapy  Treatment Goals addressed: "help me cope with my mood and all of the changes I am going through". [name] will experience fewer incidents of mood outbursts, anger, and frustration at home 4 out of 7 days, per self report.    Interventions: Motivational Interviewing   Summary: Jordan [name] is a 40 y.o. female who presents with Moderate mixed bipolar I disorder   Suicidal/Homicidal: No -without intent/plan   Therapist Response:   Jordan [name] met with clinician for an individual session. Jordan [name] discussed her psychiatric symptoms, her current life events and her homework. Jordan [name] shared that she helped her daughter clean the new apartment last week for about 4 days, and now she is in the process of getting her belongings out of Jordan [name]'s home and into her new place. Clinician utilized MI OARS to reflect and summarize thoughts and feelings about daughter moving out. Clinician identified mixed feelings, including relief and joy, frustration, and also a sense that daughter takes advantage of her. Clinician processed these feelings and noted Jordan [name]'s need to put herself first more often. Clinician processed recent feelings of anger and need to work on self soothing. Clinician shared video and practiced EFT Tapping. Jordan [name] agreed to practice this twice daily to help her relax.    Plan: Return again in 2-3 weeks.     Diagnosis: Axis I: Moderate mixed bipolar I disorder     [name], LCSW 07/06/2020

## 2020-07-08 ENCOUNTER — Telehealth: Payer: Self-pay | Admitting: Nurse Practitioner

## 2020-07-08 NOTE — Telephone Encounter (Signed)
Called and spoke with patient, she had been seen in the ER. She did not have Covid, just an URI, was given antibiotics and is doing better, and denies any fever. She did state that she is still having some congestion. Are you okay with proceeding with her procedure on 5/24?

## 2020-07-08 NOTE — Telephone Encounter (Signed)
Yes ok to proceed with her procedure as scheduled.

## 2020-07-08 NOTE — Telephone Encounter (Signed)
Patient has been advised that she is okay to proceed as plan with her procedure.

## 2020-07-12 ENCOUNTER — Ambulatory Visit (AMBULATORY_SURGERY_CENTER): Payer: Medicare Other | Admitting: Gastroenterology

## 2020-07-12 ENCOUNTER — Other Ambulatory Visit: Payer: Self-pay

## 2020-07-12 ENCOUNTER — Encounter: Payer: Self-pay | Admitting: Gastroenterology

## 2020-07-12 VITALS — BP 101/56 | HR 66 | Temp 98.0°F | Resp 16 | Ht 69.0 in | Wt 262.0 lb

## 2020-07-12 DIAGNOSIS — Z8371 Family history of colonic polyps: Secondary | ICD-10-CM

## 2020-07-12 DIAGNOSIS — Z1211 Encounter for screening for malignant neoplasm of colon: Secondary | ICD-10-CM

## 2020-07-12 MED ORDER — SODIUM CHLORIDE 0.9 % IV SOLN: 500.0000 mL | Freq: Once | INTRAVENOUS | Status: DC

## 2020-07-12 NOTE — Patient Instructions (Signed)
Please read handouts provided. Continue present medications.   YOU HAD AN ENDOSCOPIC PROCEDURE TODAY AT THE [facility]:   Refer to the procedure report that was given to you for any specific questions about what was found during the examination.  If the procedure report does not answer your questions, please call your gastroenterologist to clarify.  If you requested that your care partner not be given the details of your procedure findings, then the procedure report has been included in a sealed envelope for you to review at your convenience later.  YOU SHOULD EXPECT: Some feelings of bloating in the abdomen. Passage of more gas than usual.  Walking can help get rid of the air that was put into your GI tract during the procedure and reduce the bloating. If you had a lower endoscopy (such as a colonoscopy or flexible sigmoidoscopy) you may notice spotting of blood in your stool or on the toilet paper. If you underwent a bowel prep for your procedure, you may not have a normal bowel movement for a few days.  Please Note:  You might notice some irritation and congestion in your nose or some drainage.  This is from the oxygen used during your procedure.  There is no need for concern and it should clear up in a day or so.  SYMPTOMS TO REPORT IMMEDIATELY:  Following lower endoscopy (colonoscopy or flexible sigmoidoscopy):  Excessive amounts of blood in the stool  Significant tenderness or worsening of abdominal pains  Swelling of the abdomen that is new, acute  Fever of 100F or higher   For urgent or emergent issues, a gastroenterologist can be reached at any hour by calling [phone]. Do not use MyChart messaging for urgent concerns.    DIET:  We do recommend a small meal at first, but then you may proceed to your regular diet.  Drink plenty of fluids but you should avoid alcoholic beverages for 24 hours.  ACTIVITY:  You should plan to take it easy for the rest of today and  you should NOT DRIVE or use heavy machinery until tomorrow (because of the sedation medicines used during the test).    FOLLOW UP: Our staff will call the number listed on your records 48-72 hours following your procedure to check on you and address any questions or concerns that you may have regarding the information given to you following your procedure. If we do not reach you, we will leave a message.  We will attempt to reach you two times.  During this call, we will ask if you have developed any symptoms of COVID 19. If you develop any symptoms (ie: fever, flu-like symptoms, shortness of breath, cough etc.) before then, please call [phone].  If you test positive for Covid 19 in the 2 weeks post procedure, please call and report this information to us.    If any biopsies were taken you will be contacted by phone or by letter within the next 1-3 weeks.  Please call us at [phone] if you have not heard about the biopsies in 3 weeks.    SIGNATURES/CONFIDENTIALITY: You and/or your care partner have signed paperwork which will be entered into your electronic medical record.  These signatures attest to the fact that that the information above on your After Visit Summary has been reviewed and is understood.  Full responsibility of the confidentiality of this discharge information lies with you and/or your care-partner.  

## 2020-07-12 NOTE — Progress Notes (Signed)
A and O x3. Report to RN. Tolerated MAC anesthesia well.

## 2020-07-12 NOTE — Op Note (Signed)
[facility] Patient Name: Jordan [name] Procedure Date: 07/12/2020 1:26 PM MRN: [id number] Endoscopist: Ladene Artist , MD Age: 40 Referring MD:  Date of Birth: 04-08-1980 Gender: Female Account #: [id number] Procedure:                Colonoscopy Indications:              Colon cancer screening in patient at increased                            risk: Family history of colon polyps in multiple                            1st-degree relatives Medicines:                Monitored Anesthesia Care Procedure:                Pre-Anesthesia Assessment:                           - Prior to the procedure, a History and Physical                            was performed, and patient medications and                            allergies were reviewed. The patient's tolerance of                            previous anesthesia was also reviewed. The risks                            and benefits of the procedure and the sedation                            options and risks were discussed with the patient.                            All questions were answered, and informed consent                            was obtained. Prior Anticoagulants: The patient has                            taken no previous anticoagulant or antiplatelet                            agents. ASA Grade Assessment: III - A patient with                            severe systemic disease. After reviewing the risks                            and benefits, the patient was deemed in  satisfactory condition to undergo the procedure.                           After obtaining informed consent, the colonoscope                            was passed under direct vision. Throughout the                            procedure, the patient's blood pressure, pulse, and                            oxygen saturations were monitored continuously. The                            Olympus CF-HQ190L (Serial# [id number])  Colonoscope was                            introduced through the anus and advanced to the the                            cecum, identified by appendiceal orifice and                            ileocecal valve. The ileocecal valve, appendiceal                            orifice, and rectum were photographed. The quality                            of the bowel preparation was good. The colonoscopy                            was performed without difficulty. The patient                            tolerated the procedure well. Scope In: 1:36:03 PM Scope Out: 1:55:37 PM Scope Withdrawal Time: 0 hours 11 minutes 54 seconds  Total Procedure Duration: 0 hours 19 minutes 34 seconds  Findings:                 The perianal and digital rectal examinations were                            normal.                           The entire examined colon appeared normal on direct                            and retroflexion views. Complications:            No immediate complications. Estimated blood loss:                            None. Estimated Blood  Loss:     Estimated blood loss: none. Impression:               - The entire examined colon is normal on direct and                            retroflexion views.                           - No specimens collected. Recommendation:           - Repeat colonoscopy in 5 years for screening                            purposes.                           - Patient has a contact number available for                            emergencies. The signs and symptoms of potential                            delayed complications were discussed with the                            patient. Return to normal activities tomorrow.                            Written discharge instructions were provided to the                            patient.                           - Resume previous diet.                           - Continue present medications. Ladene Artist,  MD 07/12/2020 1:58:07 PM This report has been signed electronically.

## 2020-07-14 ENCOUNTER — Encounter: Payer: Self-pay | Admitting: *Deleted

## 2020-07-14 ENCOUNTER — Telehealth: Payer: Self-pay

## 2020-07-14 ENCOUNTER — Telehealth: Payer: Self-pay | Admitting: *Deleted

## 2020-07-14 NOTE — Telephone Encounter (Signed)
  Follow up Call-  Call back number 07/12/2020  Post procedure Call Back phone  # [phone]  Permission to leave phone message Yes  Some recent data might be hidden     Patient questions:  Message left to call us if necessary.

## 2020-07-14 NOTE — Telephone Encounter (Signed)
  Follow up Call-  Call back number 07/12/2020  Post procedure Call Back phone  # [phone]  Permission to leave phone message Yes  Some recent data might be hidden     Patient questions:  Do you have a fever, pain , or abdominal swelling? No. Pain Score  0 *  Have you tolerated food without any problems? Yes.    Have you been able to return to your normal activities? Yes.    Do you have any questions about your discharge instructions: Diet   No. Medications  No. Follow up visit  No.  Do you have questions or concerns about your Care? No.  Actions: * If pain score is 4 or above: No action needed, pain <4.  1. Have you developed a fever since your procedure? no  2.   Have you had an respiratory symptoms (SOB or cough) since your procedure? No  3.   Have you tested positive for COVID 19 since your procedure no  4.   Have you had any family members/close contacts diagnosed with the COVID 19 since your procedure?  no   If yes to any of these questions please route to Joylene John, RN and Joella Prince, RN

## 2020-07-15 ENCOUNTER — Telehealth (INDEPENDENT_AMBULATORY_CARE_PROVIDER_SITE_OTHER): Payer: Medicare Other | Admitting: Psychiatry

## 2020-07-15 ENCOUNTER — Other Ambulatory Visit: Payer: Self-pay

## 2020-07-15 ENCOUNTER — Encounter (HOSPITAL_COMMUNITY): Payer: Self-pay | Admitting: Psychiatry

## 2020-07-15 DIAGNOSIS — F4310 Post-traumatic stress disorder, unspecified: Secondary | ICD-10-CM

## 2020-07-15 DIAGNOSIS — F3132 Bipolar disorder, current episode depressed, moderate: Secondary | ICD-10-CM

## 2020-07-15 DIAGNOSIS — F419 Anxiety disorder, unspecified: Secondary | ICD-10-CM

## 2020-07-15 MED ORDER — DIVALPROEX SODIUM ER 250 MG PO TB24: 750.0000 mg | ORAL_TABLET | Freq: Every day | ORAL | 2 refills | Status: DC

## 2020-07-15 MED ORDER — HYDROXYZINE HCL 10 MG PO TABS: 10.0000 mg | ORAL_TABLET | Freq: Every day | ORAL | 1 refills | Status: DC | PRN
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
## 2020-07-19 ENCOUNTER — Other Ambulatory Visit: Payer: Self-pay

## 2020-07-19 ENCOUNTER — Encounter (HOSPITAL_COMMUNITY): Payer: Self-pay | Admitting: Licensed Clinical Social Worker

## 2020-07-19 ENCOUNTER — Ambulatory Visit (INDEPENDENT_AMBULATORY_CARE_PROVIDER_SITE_OTHER): Payer: Medicare Other | Admitting: Licensed Clinical Social Worker

## 2020-07-19 DIAGNOSIS — F3132 Bipolar disorder, current episode depressed, moderate: Secondary | ICD-10-CM | POA: Diagnosis not present

## 2020-07-19 NOTE — Progress Notes (Signed)
THERAPIST PROGRESS NOTE  Session Time: 8:05am-9:00am  Participation Level: Active  Behavioral Response: Well GroomedAlertEuthymic and Irritable Type of Therapy: Individual Therapy   Treatment Goals addressed: "help me cope with my mood and all of the changes I am going through". Terilynn will experience fewer incidents of mood outbursts, anger, and frustration at home 4 out of 7 days, per self report.    Interventions: Motivational Interviewing   Summary: Nysha Y Ramaswamy is a 39 y.o. female who presents with Moderate mixed bipolar I disorder   Suicidal/Homicidal: No -without intent/plan   Therapist Response:   Zaryia met with clinician for an individual session. Aryn discussed her psychiatric symptoms, her current life events and her homework. Brookelynn shared that her daughter is half way moved out and after exams, her niece will be returning home. Clinician explored ways for Jearldine to reclaim her space once the girls are out. Clinician also processed thoughts and feelings about the next time she is requested to take in these children. Clinician utilized MI OARS to reflect hopefulness and concerns that her girlfriend may not be able to move here, due to family issues. Clinician explored alternatives, revisiting the option of Declan moving to [place]. Rolinda shared that due to when her lease ends, she would not be able to go for one more year. Clinician validated those concerns and encouraged a real honest conversation about what she wants and what she hopes for. Clinician explored work and ability to cope with being managed by others. 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 Clinician shared mindfulness activity to create and maintain boundaries.     Plan: Return again in 2-3 weeks.     Diagnosis: Axis I: Moderate mixed bipolar I disorder       R , LCSW 07/19/2020  

## 2020-07-20 HISTORY — PX: OTHER SURGICAL HISTORY: SHX169

## 2020-07-22 ENCOUNTER — Other Ambulatory Visit: Payer: Self-pay

## 2020-07-22 ENCOUNTER — Other Ambulatory Visit (HOSPITAL_COMMUNITY)
Admission: RE | Admit: 2020-07-22 | Discharge: 2020-07-22 | Disposition: A | Discharge status: Home / Self Care | Payer: Medicare Other | Source: Ambulatory Visit | Attending: Family Medicine | Admitting: Family Medicine

## 2020-07-22 ENCOUNTER — Ambulatory Visit (INDEPENDENT_AMBULATORY_CARE_PROVIDER_SITE_OTHER): Payer: Medicare Other | Admitting: Family Medicine

## 2020-07-22 ENCOUNTER — Encounter: Payer: Self-pay | Admitting: Family Medicine

## 2020-07-22 VITALS — BP 104/73 | HR 77 | Ht 69.0 in | Wt 257.0 lb

## 2020-07-22 DIAGNOSIS — N920 Excessive and frequent menstruation with regular cycle: Secondary | ICD-10-CM

## 2020-07-22 LAB — POCT URINE PREGNANCY: Preg Test, Ur: NEGATIVE

## 2020-07-22 NOTE — Patient Instructions (Signed)

## 2020-07-22 NOTE — Progress Notes (Signed)
    GYNECOLOGY CLINIC ENDOMETRIAL BIOPSY PROCEDURE NOTE  Jordan [name] is a y 40 y.o. G0P0000 here for endometrial biopsy for abnormal uterine bleeding.  Discussed nature of the procedure and risks and benefits.  Pregnancy test: No sexual activity this year, UPT neg per nurse  No Known Allergies  Patient given informed consent, signed copy in the chart, time out was performed.    The patient was placed in the lithotomy position and the cervix brought into view with sterile speculum.  Cervix cleansed x 2 with betadine swabs. The uterus was sounded for depth of 8-9 cm. A pipelle was introduced to into the uterus, suction created,  and an endometrial sample was obtained. All equipment was removed and accounted for.  The patient tolerated the procedure well.   Patient given post procedure instructions.  Clarnce Flock, MD/MPH Attending Family Medicine Physician, [facility] for [facility], [place]
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
## 2020-07-22 NOTE — Progress Notes (Signed)
NGYN patient presents for problem visit today c/o heavy bleeding. Pt states she was referred by PCP and has had severe anemia and was getting iron infusions now taking iron daily.  Told in the past she had fibroids and surgery was discussed at old gyn office.  LMP: 07/17/20 pt still spotting, notes cycles are very heavy has to use a super pad and super tampon. Will always ruin cloths states bleeding is a "pour".   Last pap: per pt with PCP within last 3 yrs. Per chart 02/09/2019 WNL   Mammogram: 12/15/19  *Pt just had colonoscopy due to family Hx.

## 2020-07-22 NOTE — Assessment & Plan Note (Addendum)
Change in menstrual pattern over the past 5 years. Also strong family hx of colon cancer, raises possibility of Lynch syndrome. EMB obtained today and will get TVUS since three years from prior. Discussed options, she would like to avoid surgery and I recommended hormonal IUD. After counseling she is in agreement with this plan. Return in 4 weeks to review results and place hormonal IUD at that time.

## 2020-07-25 LAB — SURGICAL PATHOLOGY

## 2020-08-02 ENCOUNTER — Ambulatory Visit (INDEPENDENT_AMBULATORY_CARE_PROVIDER_SITE_OTHER): Payer: Medicare Other | Admitting: Licensed Clinical Social Worker

## 2020-08-02 ENCOUNTER — Other Ambulatory Visit: Payer: Self-pay

## 2020-08-02 ENCOUNTER — Ambulatory Visit (HOSPITAL_COMMUNITY): Payer: Medicare Other

## 2020-08-02 DIAGNOSIS — F3132 Bipolar disorder, current episode depressed, moderate: Secondary | ICD-10-CM

## 2020-08-03 ENCOUNTER — Encounter (HOSPITAL_COMMUNITY): Payer: Self-pay | Admitting: Licensed Clinical Social Worker

## 2020-08-03 NOTE — Progress Notes (Signed)
   THERAPIST PROGRESS NOTE  Session Time: 8:10am-9:00am  Participation Level: Active  Behavioral Response: Well GroomedAlertIrritable  Type of Therapy: Individual Therapy Treatment Goals addressed: "help me cope with my mood and all of the changes I am going through". Wave will experience fewer incidents of mood outbursts, anger, and frustration at home 4 out of 7 days, per self report.    Interventions: Motivational Interviewing   Summary: Jordan [name] is a 40 y.o. female who presents with Moderate mixed bipolar I disorder   Suicidal/Homicidal: No -without intent/plan   Therapist Response:   Jordan [name] met with clinician for an individual session. Jordan [name] discussed her psychiatric symptoms, her current life events and her homework. 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 Clinician explored updates in her mental health and relationships with family. Jordan [name] processed her feelings about her sister, who has poor physical and mental health. Clinician utilized MI OARS to reflect and summarize thoughts and feelings. Clinician normalized negative feelings, and also reminded Jordan [name] that her sister is not her responsibility. Clinician discussed updates with nieces, noting that younger niece left and came back because she had no where else to go. Jordan [name] reported that she will allow niece to stay, as long as she maintains a job this summer and cleans up after herself. She shared that niece already secured a job at [employer] and [employer] and at [employer].    Plan: Return again in 2-3 weeks.     Diagnosis: Axis I: Moderate mixed bipolar I disorder       [name], LCSW 08/03/2020

## 2020-08-05 ENCOUNTER — Ambulatory Visit (HOSPITAL_COMMUNITY)
Admission: RE | Admit: 2020-08-05 | Discharge: 2020-08-05 | Disposition: A | Discharge status: Home / Self Care | Payer: Medicare Other | Source: Ambulatory Visit | Attending: Family Medicine | Admitting: Family Medicine

## 2020-08-05 ENCOUNTER — Other Ambulatory Visit: Payer: Self-pay

## 2020-08-05 DIAGNOSIS — N920 Excessive and frequent menstruation with regular cycle: Secondary | ICD-10-CM | POA: Diagnosis present

## 2020-08-06 ENCOUNTER — Other Ambulatory Visit: Payer: Self-pay | Admitting: Student

## 2020-08-06 ENCOUNTER — Other Ambulatory Visit: Payer: Self-pay | Admitting: Internal Medicine

## 2020-08-06 DIAGNOSIS — M797 Fibromyalgia: Secondary | ICD-10-CM

## 2020-08-10 ENCOUNTER — Encounter: Payer: Self-pay | Admitting: Internal Medicine

## 2020-08-10 ENCOUNTER — Ambulatory Visit (INDEPENDENT_AMBULATORY_CARE_PROVIDER_SITE_OTHER): Payer: Medicare Other | Admitting: Internal Medicine

## 2020-08-10 ENCOUNTER — Other Ambulatory Visit: Payer: Self-pay

## 2020-08-10 VITALS — BP 91/68 | HR 77 | Temp 99.4°F | Ht 69.0 in | Wt 259.7 lb

## 2020-08-10 DIAGNOSIS — E559 Vitamin D deficiency, unspecified: Secondary | ICD-10-CM | POA: Diagnosis not present

## 2020-08-10 DIAGNOSIS — R748 Abnormal levels of other serum enzymes: Secondary | ICD-10-CM | POA: Diagnosis not present

## 2020-08-10 DIAGNOSIS — D509 Iron deficiency anemia, unspecified: Secondary | ICD-10-CM | POA: Diagnosis not present

## 2020-08-10 DIAGNOSIS — E611 Iron deficiency: Secondary | ICD-10-CM | POA: Diagnosis not present

## 2020-08-10 DIAGNOSIS — R221 Localized swelling, mass and lump, neck: Secondary | ICD-10-CM | POA: Diagnosis not present

## 2020-08-10 DIAGNOSIS — R5383 Other fatigue: Secondary | ICD-10-CM

## 2020-08-10 DIAGNOSIS — R7303 Prediabetes: Secondary | ICD-10-CM | POA: Diagnosis not present

## 2020-08-10 DIAGNOSIS — N920 Excessive and frequent menstruation with regular cycle: Secondary | ICD-10-CM | POA: Diagnosis not present

## 2020-08-10 DIAGNOSIS — R6889 Other general symptoms and signs: Secondary | ICD-10-CM | POA: Diagnosis not present

## 2020-08-10 LAB — POCT GLYCOSYLATED HEMOGLOBIN (HGB A1C): Hemoglobin A1C: 5.4 % (ref 4.0–5.6)

## 2020-08-10 LAB — GLUCOSE, CAPILLARY: Glucose-Capillary: 93 mg/dL (ref 70–99)

## 2020-08-10 NOTE — Patient Instructions (Addendum)
Ms. Greb,  It was a pleasure meeting you today. Today we discussed your fatigue. We will collect blood work today to assess for any electrolyte abnormalities, anemia, vitamin deficiencies, and thyroid issues. We will reach out to you with the results.   For your neck lump, we will get some imaging to assess if it is a cyst or a swollen lymph node. We will see you in three month's time. Please make an appointment if you are experiencing worsening symptoms.  Have a good day,  Maudie Mercury, MD
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
## 2020-08-11 DIAGNOSIS — D170 Benign lipomatous neoplasm of skin and subcutaneous tissue of head, face and neck: Secondary | ICD-10-CM | POA: Insufficient documentation

## 2020-08-11 DIAGNOSIS — R5383 Other fatigue: Secondary | ICD-10-CM

## 2020-08-11 DIAGNOSIS — R221 Localized swelling, mass and lump, neck: Secondary | ICD-10-CM | POA: Insufficient documentation

## 2020-08-11 DIAGNOSIS — R5382 Chronic fatigue, unspecified: Secondary | ICD-10-CM | POA: Insufficient documentation

## 2020-08-11 HISTORY — DX: Other fatigue: R53.83

## 2020-08-11 LAB — IRON AND TIBC
Iron Saturation: 27 % (ref 15–55)
Iron: 71 ug/dL (ref 27–159)
Total Iron Binding Capacity: 263 ug/dL (ref 250–450)
UIBC: 192 ug/dL (ref 131–425)

## 2020-08-11 LAB — CMP14 + ANION GAP
ALT: 8 IU/L (ref 0–32)
AST: 14 IU/L (ref 0–40)
Albumin/Globulin Ratio: 1.8 (ref 1.2–2.2)
Albumin: 4.1 g/dL (ref 3.8–4.8)
Alkaline Phosphatase: 42 IU/L — ABNORMAL LOW (ref 44–121)
Anion Gap: 13 mmol/L (ref 10.0–18.0)
BUN/Creatinine Ratio: 17 (ref 9–23)
BUN: 13 mg/dL (ref 6–24)
Bilirubin Total: 0.3 mg/dL (ref 0.0–1.2)
CO2: 24 mmol/L (ref 20–29)
Calcium: 9.3 mg/dL (ref 8.7–10.2)
Chloride: 102 mmol/L (ref 96–106)
Creatinine, Ser: 0.77 mg/dL (ref 0.57–1.00)
Globulin, Total: 2.3 g/dL (ref 1.5–4.5)
Glucose: 83 mg/dL (ref 65–99)
Potassium: 4.1 mmol/L (ref 3.5–5.2)
Sodium: 139 mmol/L (ref 134–144)
Total Protein: 6.4 g/dL (ref 6.0–8.5)
eGFR: 100 mL/min/{1.73_m2} (ref >59)

## 2020-08-11 LAB — CBC WITH DIFFERENTIAL/PLATELET
Basophils Absolute: 0 10*3/uL (ref 0.0–0.2)
Basos: 0 %
EOS (ABSOLUTE): 0 10*3/uL (ref 0.0–0.4)
Eos: 1 %
Hematocrit: 39.4 % (ref 34.0–46.6)
Hemoglobin: 13.4 g/dL (ref 11.1–15.9)
Immature Grans (Abs): 0 10*3/uL (ref 0.0–0.1)
Immature Granulocytes: 0 %
Lymphocytes Absolute: 3.2 10*3/uL — ABNORMAL HIGH (ref 0.7–3.1)
Lymphs: 57 %
MCH: 29 pg (ref 26.6–33.0)
MCHC: 34 g/dL (ref 31.5–35.7)
MCV: 85 fL (ref 79–97)
Monocytes Absolute: 0.5 10*3/uL (ref 0.1–0.9)
Monocytes: 9 %
Neutrophils Absolute: 1.9 10*3/uL (ref 1.4–7.0)
Neutrophils: 33 %
Platelets: 217 10*3/uL (ref 150–450)
RBC: 4.62 x10E6/uL (ref 3.77–5.28)
RDW: 13.9 % (ref 11.7–15.4)
WBC: 5.6 10*3/uL (ref 3.4–10.8)

## 2020-08-11 LAB — VITAMIN B12: Vitamin B-12: 1264 pg/mL — ABNORMAL HIGH (ref 232–1245)

## 2020-08-11 LAB — VITAMIN D 25 HYDROXY (VIT D DEFICIENCY, FRACTURES): Vit D, 25-Hydroxy: 46.4 ng/mL (ref 30.0–100.0)

## 2020-08-11 LAB — FERRITIN: Ferritin: 89 ng/mL (ref 15–150)

## 2020-08-11 LAB — TSH: TSH: 1.85 u[IU]/mL (ref 0.450–4.500)

## 2020-08-11 LAB — T4, FREE: Free T4: 1.05 ng/dL (ref 0.82–1.77)

## 2020-08-11 NOTE — Assessment & Plan Note (Signed)
Patient with a history of menorrhagia with change in cycle to 7 days from 3 days.  She admits that she has been going through multiple superabsorbent pads and tampons.  She states she has established with a new OB/GYN who will initiate her on a hormonal IUD.  She is going back for IUD placement in early July. - Follow-up with OB/GYN for IUD placement.

## 2020-08-11 NOTE — Assessment & Plan Note (Signed)
Patient presents to the clinic today with concerns for a lump on the back of her neck which has been slowly growing over the past 2 years.  She states that she has been holding off on coming in for evaluation due to her history of cysts. She states that there is no tenderness and it is painless.  She states she wants a checkup today due to concerns voiced by her family numbers.  A/P: Patient presents with growth on the back of her neck which has been slowly growing over the past 2 years.  It is approximately 2 cm well-circumscribed, nontender mass that is mobile and superficial appearing.  She does have a history of cysts will obtain imaging for further evaluation. - Ultrasound of the neck

## 2020-08-11 NOTE — Assessment & Plan Note (Addendum)
Patient with a relevant history of fibromyalgia, menorrhagia, depression/bipolar disorder, and insomnia presents to clinic with acute onset fatigue starting June 19.  She states that her symptoms started when she got up that morning.  She felt like she had loss of energy was unable to get out of bed or climb up her staircase.  Her daughter was able to bring her food even though she did not ask endorse an appetite, because she was unable to go down the stairs.  She states that she does have a history of fibromyalgia, and the last time that she had symptoms similar to this she thought it was a fibromyalgia flare, but she ended up needing an iron infusion due to her heavy periods. Of note, she started her period today.  A/P: Patient presents with acute on chronic fatigue, with concerns for anemia.  Patient does have a history of menorrhagia that has required iron infusions in the past.  Given the acuteness of this event, will obtain blood labs today to rule out anemia in the setting of starting her period today.  She also has a history of fibromyalgia depression which could be contributors as well.  We will also assess thyroid function, electrolyte abnormalities, and vitamin deficiencies. - Obtain CBC - Obtain iron panel - Obtain ferritin - Obtain TSH, T4 - Obtain A1c - Obtain vitamin B12 - Obtain vitamin D hydroxy 25

## 2020-08-16 ENCOUNTER — Ambulatory Visit (INDEPENDENT_AMBULATORY_CARE_PROVIDER_SITE_OTHER): Payer: Medicare Other | Admitting: Licensed Clinical Social Worker

## 2020-08-16 ENCOUNTER — Other Ambulatory Visit: Payer: Self-pay

## 2020-08-16 ENCOUNTER — Encounter (HOSPITAL_COMMUNITY): Payer: Self-pay | Admitting: Licensed Clinical Social Worker

## 2020-08-16 DIAGNOSIS — F3132 Bipolar disorder, current episode depressed, moderate: Secondary | ICD-10-CM | POA: Diagnosis not present

## 2020-08-16 NOTE — Progress Notes (Signed)
   THERAPIST PROGRESS NOTE  Session Time: 8:00am-8:55am  Participation Level: Active  Behavioral Response: Well GroomedAlertDepressed  Type of Therapy: Individual Therapy  Type of Therapy: Individual Therapy Treatment Goals addressed: "help me cope with my mood and all of the changes I am going through". Jordan [name] will experience fewer incidents of mood outbursts, anger, and frustration at home 4 out of 7 days, per self report.    Interventions: Motivational Interviewing   Summary: Jordan [name] is a 40 y.o. female who presents with Moderate mixed bipolar I disorder   Suicidal/Homicidal: No -without intent/plan   Therapist Response:   Jordan [name] met with clinician for an individual session. Jordan [name] discussed her psychiatric symptoms, her current life events and her homework. Jordan [name] shared that she is looking forward to her girlfriend and son moving here next month. Clinician processed changes that will occur with the in town and the reality of Jordan [name]'s lifestyle. Clinician challenged Jordan [name] to be open minded and also aware that life will be different, in both positive and negative ways. Clinician utilized MI OARS to reflect and summarize thoughts and feelings.  Jordan [name] shared updates on nieces and noted the challenges financially and emotionally of dealing with a senior in high school.  Clinician discussed Jordan [name]'s concerns and fears of getting an IUD. Clinician processed thoughts and feelings. Clinician utilized reality testing and encouraged coping skills to assist Jordan [name] in getting through the procedure.   Plan: Return again in 2-3 weeks.     Diagnosis: Axis I: Moderate mixed bipolar I disorder      Jordan Curling, LCSW 08/16/2020

## 2020-08-16 NOTE — Progress Notes (Signed)
Internal Medicine Clinic Attending ? ?Case discussed with Dr. Winters  At the time of the visit.  We reviewed the resident?s history and exam and pertinent patient test results.  I agree with the assessment, diagnosis, and plan of care documented in the resident?s note.  ?

## 2020-08-23 ENCOUNTER — Encounter: Payer: Self-pay | Admitting: *Deleted

## 2020-08-23 ENCOUNTER — Encounter: Payer: Self-pay | Admitting: Obstetrics and Gynecology

## 2020-08-23 ENCOUNTER — Ambulatory Visit (INDEPENDENT_AMBULATORY_CARE_PROVIDER_SITE_OTHER): Payer: Medicare Other | Admitting: Obstetrics and Gynecology

## 2020-08-23 ENCOUNTER — Other Ambulatory Visit: Payer: Self-pay

## 2020-08-23 ENCOUNTER — Telehealth: Payer: Self-pay | Admitting: *Deleted

## 2020-08-23 VITALS — BP 94/66 | HR 75 | Ht 69.0 in | Wt 261.9 lb

## 2020-08-23 DIAGNOSIS — D250 Submucous leiomyoma of uterus: Secondary | ICD-10-CM | POA: Insufficient documentation

## 2020-08-23 DIAGNOSIS — N83209 Unspecified ovarian cyst, unspecified side: Secondary | ICD-10-CM

## 2020-08-23 DIAGNOSIS — N83202 Unspecified ovarian cyst, left side: Secondary | ICD-10-CM | POA: Diagnosis not present

## 2020-08-23 DIAGNOSIS — N920 Excessive and frequent menstruation with regular cycle: Secondary | ICD-10-CM | POA: Diagnosis not present

## 2020-08-23 HISTORY — DX: Unspecified ovarian cyst, unspecified side: N83.209

## 2020-08-23 MED ORDER — TRANEXAMIC ACID 650 MG PO TABS: 1300.0000 mg | ORAL_TABLET | Freq: Three times a day (TID) | ORAL | 4 refills | Status: DC

## 2020-08-23 NOTE — Progress Notes (Signed)
  CC: ultrasound follow up Subjective:    Patient ID: Jordan [name], female    DOB: 1981/01/15, 40 y.o.   MRN: [id number]  HPI 40 yo G0 seen at [facility] for possible IUD placement and [place] follow up.  US showed two submucosal fibroids.  Discussed options for treatment of menorrhagia including IUD, lysteda, Sonata procedure, and fibroid resection.  Pt is interested in the [place] procedure and is a good candidate other than nulliparous cervix.  Pt does not want children.  She has had anemia severe enough to get iron infusion and has failed OCP.     Review of Systems     Objective:   Physical Exam Vitals:   08/23/20 0824  BP: 94/66  Pulse: 75   CLINICAL DATA:  40 year old female with abnormal uterine bleeding.   EXAM: ULTRASOUND PELVIS TRANSVAGINAL   TECHNIQUE: Transvaginal ultrasound examination of the pelvis was performed including evaluation of the uterus, ovaries, adnexal regions, and pelvic cul-de-sac.   COMPARISON:  None.   FINDINGS: Uterus   Measurements: 9.2 x 7.8 x 6.7 cm = volume: 252 mL. The uterus is retroverted and anteflexed and demonstrates a heterogeneous echotexture with multiple fibroids. There is a 3.3 x 3.4 x 3.0 cm fundal intramural fibroid. A 4.5 x 3.8 x 4.5 cm right fundal submucosal, partially exophytic fibroid and a 1.8 x 2.3 x 1.9 cm left fundal submucosal fibroid.   Endometrium   Thickness: 8 mm. The endometrium is poorly visualized and suboptimally evaluated. There is however apparent distortion of the midportion of the endometrium likely secondary to submucosal fibroids.   Right ovary   Measurements: 3.6 x 2.8 x 2.2 cm = volume: 12 mL. A 2.6 x 1.9 x 1.7 cm cystic structure with uniform low-level echoes may represent an endometrioma or a complex/hemorrhagic cyst. There is however apparent area of wall thickening in the periphery of the cyst.   Left ovary   Oophorectomy.   Other findings:  Small free fluid within the pelvis.    IMPRESSION: 1. Heterogeneous uterus with multiple fibroids as above. 2. Distorted appearance of the endometrium, likely related to submucosal fibroid. 3. A complex/hemorrhagic cyst versus an endometrioma in the right ovary. Follow-up with ultrasound in 8-10 weeks recommended. 4. Left oophorectomy.        Assessment & Plan:   1. Menorrhagia with regular cycle Will schedule patient for Sonata procedure, but will prescribe lysteda as a temporizing measure. - tranexamic acid (LYSTEDA) 650 MG TABS tablet; Take 2 tablets (1,300 mg total) by mouth 3 (three) times daily. Take during menses for a maximum of five days  Dispense: 30 tablet; Refill: 4  2. Fibroids, submucosal Sonata procedure as above.  3. Cyst of left ovary Will get follow up scan in 7 weeks to reeval remaining ovary - US PELVIC COMPLETE WITH TRANSVAGINAL; Future  I spent 20 minutes dedicated to the care of this patient including previsit review of records, face to face time with the patient discussing ultrasound findings, treatment options and post visit testing.   Griffin Basil, MD Faculty Attending, [facility]

## 2020-08-23 NOTE — Progress Notes (Signed)
Patient presents for IUD insert and to discuss u/s results.

## 2020-08-23 NOTE — Telephone Encounter (Signed)
Call to patient regarding scheduling and precert for Sonata procedure. Left message to call back to [phone].

## 2020-08-24 NOTE — Telephone Encounter (Signed)
Voice mail from patient returning call. Call back to patient. Reviewed Sonata precert process and ROI. Patient will come in today to sign consents.

## 2020-08-24 NOTE — Telephone Encounter (Signed)
Patient came to office and signed forms for Sonata. Patient is aware she may receive contact from [facility]/Sonata. Once insurance approves, will schedule procedure. Denies questions. See scanned documents.   Encounter closed.

## 2020-08-30 ENCOUNTER — Other Ambulatory Visit: Payer: Self-pay

## 2020-08-30 ENCOUNTER — Ambulatory Visit (INDEPENDENT_AMBULATORY_CARE_PROVIDER_SITE_OTHER): Payer: Medicare Other | Admitting: Licensed Clinical Social Worker

## 2020-08-30 DIAGNOSIS — F3132 Bipolar disorder, current episode depressed, moderate: Secondary | ICD-10-CM

## 2020-09-01 ENCOUNTER — Encounter (HOSPITAL_COMMUNITY): Payer: Self-pay | Admitting: Licensed Clinical Social Worker
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
## 2020-09-02 ENCOUNTER — Ambulatory Visit (HOSPITAL_COMMUNITY)
Admission: RE | Admit: 2020-09-02 | Discharge: 2020-09-02 | Disposition: A | Discharge status: Home / Self Care | Payer: Medicare Other | Source: Ambulatory Visit | Attending: Internal Medicine | Admitting: Internal Medicine

## 2020-09-02 ENCOUNTER — Encounter: Payer: Self-pay | Admitting: *Deleted

## 2020-09-02 ENCOUNTER — Other Ambulatory Visit: Payer: Self-pay

## 2020-09-02 DIAGNOSIS — R221 Localized swelling, mass and lump, neck: Secondary | ICD-10-CM | POA: Insufficient documentation

## 2020-09-02 NOTE — Progress Notes (Signed)

## 2020-09-05 NOTE — Progress Notes (Signed)
Things That May Be Affecting Your Health:  Alcohol  Hearing loss  Pain    Depression  Home Safety  Sexual Health   Diabetes  Lack of physical activity  Stress   Difficulty with daily activities  Loneliness  Tiredness   Drug use  Medicines  Tobacco use   Falls  Motor Vehicle Safety  Weight   Food choices  Oral Health  Other    YOUR PERSONALIZED HEALTH PLAN : 1. Schedule your next subsequent Medicare Wellness visit in one year 2. Attend all of your regular appointments to address your medical issues 3. Complete the preventative screenings and services   Annual Wellness Visit   Medicare Covered Preventative Screenings and [place] Men and Women Who How Often Need? Date of Last Service Action  Abdominal Aortic Aneurysm Adults with AAA risk factors Once      Alcohol Misuse and Counseling All Adults Screening once a year if no alcohol misuse. Counseling up to 4 face to face sessions. Yes   Please screen   Bone Density Measurement  Adults at risk for osteoporosis Once every 2 yrs      Lipid Panel Z13.6 All adults without CV disease Once every 5 yrs       Colorectal Cancer  Stool sample or Colonoscopy All adults 79 and older  Once every year Every 10 years        Depression All Adults Once a year Yes Today  Please screen  Diabetes Screening Blood glucose, post glucose load, or GTT Z13.1 All adults at risk Pre-diabetics Once per year Twice per year Yes    Please screen   Diabetes  Self-Management Training All adults Diabetics 10 hrs first year; 2 hours subsequent years. Requires Copay     Glaucoma Diabetics Family history of glaucoma African Americans 38 yrs + Hispanic Americans 40 yrs + Annually - requires coppay      Hepatitis C Z72.89 or F19.20 High Risk for HCV Born between 1945 and 1965 Annually Once      HIV Z11.4 All adults based on risk Annually btw ages 31 & 27 regardless of risk Annually > 65 yrs if at increased risk      Lung Cancer  Screening Asymptomatic adults aged 85-77 with 30 pack yr history and current smoker OR quit within the last 15 yrs Annually Must have counseling and shared decision making documentation before first screen      Medical Nutrition Therapy Adults with  Diabetes Renal disease Kidney transplant within past 3 yrs 3 hours first year; 2 hours subsequent years     Obesity and Counseling All adults Screening once a year Counseling if BMI 30 or higher  Today   Tobacco Use Counseling Adults who use tobacco  Up to 8 visits in one year     Vaccines Z23 Hepatitis B Influenza  Pneumonia  Adults  Once Once every flu season Two different vaccines separated by one year     Next Annual Wellness Visit People with Medicare Every year  Today     Services & Screenings Women Who How Often Need  Date of Last Service Action  Mammogram  Z12.31 Women over 20 One baseline ages 58-39. Annually ager 40 yrs+      Pap tests All women Annually if high risk. Every 2 yrs for normal risk women No  02/09/2019   Screening for cervical cancer with  Pap (Z01.419 nl or Z01.411abnl) & HPV Z11.51 Women aged 34 to 64  Once every 5 yrs     Screening pelvic and breast exams All women Annually if high risk. Every 2 yrs for normal risk women     Sexually Transmitted Diseases Chlamydia Gonorrhea Syphilis All at risk adults Annually for non pregnant females at increased risk Maybe   Please offer       [place] Men Who How Ofter Need  Date of Last Service Action  Prostate Cancer - DRE & PSA Men over 50 Annually.  DRE might require a copay.        Sexually Transmitted Diseases Syphilis All at risk adults Annually for men at increased risk      Health Maintenance List Health Maintenance  Topic Date Due   Pneumococcal Vaccine 39-46 Years old (1 - PCV) Never done   Hepatitis C Screening  Never done   COVID-19 Vaccine (4 - Booster for Pfizer series) 08/22/2020   INFLUENZA VACCINE  09/19/2020   PAP  SMEAR-Modifier  02/09/2024   TETANUS/TDAP  10/31/2024   LIPID PANEL  05/23/2025   COLONOSCOPY (Pts 45-29yrs Insurance coverage will need to be confirmed)  07/12/2025   HIV Screening  Completed   HPV VACCINES  Aged Out

## 2020-09-13 ENCOUNTER — Ambulatory Visit (INDEPENDENT_AMBULATORY_CARE_PROVIDER_SITE_OTHER): Payer: Medicare Other | Admitting: Licensed Clinical Social Worker

## 2020-09-13 ENCOUNTER — Other Ambulatory Visit: Payer: Self-pay

## 2020-09-13 ENCOUNTER — Encounter (HOSPITAL_COMMUNITY): Payer: Self-pay | Admitting: Licensed Clinical Social Worker

## 2020-09-13 DIAGNOSIS — F3132 Bipolar disorder, current episode depressed, moderate: Secondary | ICD-10-CM

## 2020-09-13 DIAGNOSIS — F4310 Post-traumatic stress disorder, unspecified: Secondary | ICD-10-CM | POA: Diagnosis not present
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
## 2020-09-14 ENCOUNTER — Other Ambulatory Visit: Payer: Self-pay | Admitting: *Deleted

## 2020-09-14 DIAGNOSIS — M797 Fibromyalgia: Secondary | ICD-10-CM

## 2020-09-14 MED ORDER — PREGABALIN 150 MG PO CAPS: 150.0000 mg | ORAL_CAPSULE | Freq: Two times a day (BID) | ORAL | 0 refills | Status: DC

## 2020-09-14 NOTE — Telephone Encounter (Signed)
Message left for pt that her Lyrica has been refilled.

## 2020-09-14 NOTE — Telephone Encounter (Signed)
Pt stated the pharmacy had been trying since last week to get Lyrica refilled.

## 2020-09-15 ENCOUNTER — Ambulatory Visit (INDEPENDENT_AMBULATORY_CARE_PROVIDER_SITE_OTHER): Payer: Medicare Other | Admitting: Pharmacist

## 2020-09-15 ENCOUNTER — Ambulatory Visit: Payer: Medicare Other | Admitting: Student

## 2020-09-15 ENCOUNTER — Encounter: Payer: Self-pay | Admitting: Pharmacist

## 2020-09-15 VITALS — Ht 69.0 in | Wt 252.0 lb

## 2020-09-15 DIAGNOSIS — Z0000 Encounter for general adult medical examination without abnormal findings: Secondary | ICD-10-CM

## 2020-09-15 NOTE — Progress Notes (Signed)
This AWV is being conducted by [facility] only. The patient was located at home and I was located in [facility]. The patient's identity was confirmed using their DOB and current address. The patient or his/her legal guardian has consented to being evaluated through a telephone encounter and understands the associated risks (an examination cannot be done and the patient may need to come in for an appointment) / benefits (allows the patient to remain at home, decreasing exposure to coronavirus). I personally spent 27 minutes conducting the AWV.  Subjective:   Jordan [name] is a 40 y.o. female who presents for a Medicare Annual Wellness Visit.  The following items have been reviewed and updated today in the appropriate area in the EMR.   Health Risk Assessment  Height, weight, BMI, and BP Visual acuity if needed Depression screen Fall risk / safety level Advance directive discussion Medical and family history were reviewed and updated Updating list of other providers & suppliers Medication reconciliation, including over the counter medicines Cognitive screen Written screening schedule Risk Factor list Personalized health advice, risky behaviors, and treatment advice  Social History   Social History Narrative   Current Social History 09/15/20      Patient lives alone in a townhome which is 2 stories. There are 4 steps up to the entrance the patient uses. There is a railing.      Patient's method of transportation is personal car.      The highest level of education was some college.      The patient currently disabled but working part time       Identified important Relationships are "2 nieces and my best friend"       Pets : 1 dog, yorkie named Tax inspector / Fun: "Umm I don't know I am trying to find some things"      Current Stressors: "finances and trying to keep this weight off"       Religious / Personal Beliefs: "Christianity"        Cardiac Risk  Factors include: dyslipidemia;obesity (BMI >30kg/m2);sedentary lifestyle    Objective:    Vitals: Ht '5\' 9"'$  (1.753 m)   Wt 252 lb (114.3 kg)   LMP 09/08/2020   BMI 37.21 kg/m  Vitals are patient reported  Activities of Daily Living In your present state of health, do you have any difficulty performing the following activities: 09/15/2020 08/10/2020  Hearing? N N  Vision? N N  Comment - -  Difficulty concentrating or making decisions? Y N  Comment - -  Walking or climbing stairs? Y Y  Comment - pain  Dressing or bathing? N N  Doing errands, shopping? N N  Preparing Food and eating ? N -  Using the Toilet? N -  In the past six months, have you accidently leaked urine? N -  Do you have problems with loss of bowel control? N -  Managing your Medications? N -  Managing your Finances? N -  Housekeeping or managing your Housekeeping? N -  Some recent data might be hidden    Goals  Goals      Exercise 3x per week (30 min per time)     Walk 2 miles 3 x week      Exercise 5-6x per week (30 min per day and increase as able to 60 minutes per day)     Reduce calorie intake to 1800-1900 calories per day  Reduce fat intake to 45-55 grams per day     Reduce saturated fat to <15 grams and trans fat to < 2 grams respecitively        Fall Risk Fall Risk  09/15/2020 08/10/2020 05/23/2020 04/11/2020 03/02/2020  Falls in the past year? 1 0 1 1 0  Comment - - - - -  Number falls in past yr: 0 0 0 0 -  Comment - - - - -  Injury with Fall? '1 1 1 1 '$ -  Comment - - ELBOW AND WRIST / FELL DOWN STEPS - -  Risk Factor Category  - - - - -  Risk for fall due to : History of fall(s) History of fall(s);Impaired balance/gait - - No Fall Risks  Risk for fall due to: Comment - muscle spasm and leg went out - - -  Follow up Falls prevention discussed;Education provided;Falls evaluation completed Falls prevention discussed - - Falls prevention discussed    Depression Screen PHQ 2/9 Scores 09/15/2020  08/10/2020 05/23/2020 04/11/2020  PHQ - 2 Score '2 4 1 4  '$ PHQ- 9 Score '9 12 2 16  '$ Some recent data might be hidden     Cognitive Testing Six-Item Cognitive Screener   "I would like to ask you some questions that ask you to use your memory. I am going to name three objects. Please wait until I say all three words, then repeat them. Remember what they are  because I am going to ask you to name them again in a few minutes. Please repeat these words for me: APPLE--TABLE--PENNY." (Interviewer may repeat names 3 times if necessary but repetition not scored.)  Did patient correctly repeat all three words? Yes - may proceed with screen  What year is this? Correct What month is this? Correct What day of the week is this? Correct  What were the three objects I asked you to remember? Apple Correct Table Correct Penny Correct  Score one point for each incorrect answer.  A score of 2 or more points warrants additional investigation.  Patient's score 0     Assessment and Plan:    During the course of the visit the patient was educated and counseled about appropriate screening and preventive services as documented in the assessment and plan.  Discussed with patient options to encourage exercising 3x/week working towards goal of 150 min/week.   The printed AVS was given to the patient and included an updated screening schedule, a list of risk factors, and personalized health advice.        Hughes Better, RPH-CPP  09/15/2020

## 2020-09-19 NOTE — Patient Instructions (Addendum)
Things That May Be Affecting Your Health:   Alcohol   Hearing loss   Pain    Depression   Home Safety   Sexual Health    Diabetes   Lack of physical activity   Stress    Difficulty with daily activities   Loneliness   Tiredness    Drug use   Medicines   Tobacco use    Falls   Motor Vehicle Safety   Weight    Food choices   Oral Health   Other      YOUR PERSONALIZED HEALTH PLAN : 1. Schedule your next subsequent Medicare Wellness visit in one year 2. Attend all of your regular appointments to address your medical issues 3. Complete the preventative screenings and services 4. Continue to work towards your goal of exercising three times a week.      Annual Wellness Visit                       Medicare Covered Preventative Screenings and Services   Services & Screenings Men and Women Who How Often Need? Date of Last Service Action  Abdominal Aortic Aneurysm Adults with AAA risk factors Once        Alcohol Misuse and Counseling All Adults Screening once a year if no alcohol misuse. Counseling up to 4 face to face sessions. Yes    Please screen  Bone Density Measurement Adults at risk for osteoporosis Once every 2 yrs        Lipid Panel Z13.6 All adults without CV disease Once every 5 yrs            Colorectal Cancer Stool sample or Colonoscopy All adults 24 and older   Once every year Every 10 years              Depression All Adults Once a year Yes Today  Please screen  Diabetes Screening Blood glucose, post glucose load, or GTT Z13.1 All adults at risk Pre-diabetics Once per year Twice per year Yes      Please screen  Diabetes  Self-Management Training All adults Diabetics 10 hrs first year; 2 hours subsequent years. Requires Copay        Glaucoma Diabetics Family history of glaucoma African Americans 73 yrs + Hispanic Americans 44 yrs + Annually - requires coppay          Hepatitis C Z72.89 or F19.20 High Risk for HCV Born between 1945 and 1965 Annually Once           HIV Z11.4 All adults based on risk Annually btw ages 27 & 76 regardless of risk Annually > 65 yrs if at increased risk          Lung Cancer Screening Asymptomatic adults aged 71-77 with 30 pack yr history and current smoker OR quit within the last 15 yrs Annually Must have counseling and shared decision making documentation before first screen          Medical Nutrition Therapy Adults with Diabetes Renal disease Kidney transplant within past 3 yrs 3 hours first year; 2 hours subsequent years        Obesity and Counseling All adults Screening once a year Counseling if BMI 30 or higher   Today    Tobacco Use Counseling Adults who use tobacco Up to 8 visits in one year        Vaccines Z23 Hepatitis B Influenza Pneumonia Adults   Once Once every flu season Two  different vaccines separated by one year        Next Annual Wellness Visit People with Medicare Every year   Today        [place] Women Who How Often Need Date of Last Service Action  Mammogram  Z12.31 Women over 73 One baseline ages 50-39. Annually ager 40 yrs+          Pap tests All women Annually if high risk. Every 2 yrs for normal risk women No   02/09/2019    Screening for cervical cancer with Pap (Z01.419 nl or Z01.411abnl) & HPV Z11.51 Women aged 4 to 26 Once every 5 yrs        Screening pelvic and breast exams All women Annually if high risk. Every 2 yrs for normal risk women        Sexually Transmitted Diseases Chlamydia Gonorrhea Syphilis All at risk adults Annually for non pregnant females at increased risk Maybe    Please offer          [place] Men Who How Ofter Need Date of Last Service Action  Prostate Cancer - DRE & PSA Men over 50 Annually. DRE might require a copay.              Sexually Transmitted Diseases Syphilis All at risk adults Annually for men at increased risk          Health Maintenance List     Health Maintenance  Topic Date Due   Pneumococcal  Vaccine 62-28 Years old (1 - PCV) Never done   Hepatitis C Screening Never done   COVID-19 Vaccine (4 - Booster for Pfizer series) 08/22/2020   INFLUENZA VACCINE 09/19/2020   PAP SMEAR-Modifier 02/09/2024   TETANUS/TDAP 10/31/2024   LIPID PANEL 05/23/2025   COLONOSCOPY (Pts 45-26yr Insurance coverage will need to be confirmed) 07/12/2025   HIV Screening Completed   HPV VACCINES Aged Out   Fall Prevention in the Home, Adult Falls can cause injuries and can happen to people of all ages. There are many things you can do to make your home safe and to help prevent falls. Ask forhelp when making these changes. What actions can I take to prevent falls? General Instructions Use good lighting in all rooms. Replace any light bulbs that burn out. Turn on the lights in dark areas. Use night-lights. Keep items that you use often in easy-to-reach places. Lower the shelves around your home if needed. Set up your furniture so you have a clear path. Avoid moving your furniture around. Do not have throw rugs or other things on the floor that can make you trip. Avoid walking on wet floors. If any of your floors are uneven, fix them. Add color or contrast paint or tape to clearly mark and help you see: Grab bars or handrails. First and last steps of staircases. Where the edge of each step is. If you use a stepladder: Make sure that it is fully opened. Do not climb a closed stepladder. Make sure the sides of the stepladder are locked in place. Ask someone to hold the stepladder while you use it. Know where your pets are when moving through your home. What can I do in the bathroom?     Keep the floor dry. Clean up any water on the floor right away. Remove soap buildup in the tub or shower. Use nonskid mats or decals on the floor of the tub or shower. Attach bath mats securely with double-sided, nonslip rug  tape. If you need to sit down in the shower, use a plastic, nonslip stool. Install grab bars  by the toilet and in the tub and shower. Do not use towel bars as grab bars. What can I do in the bedroom? Make sure that you have a light by your bed that is easy to reach. Do not use any sheets or blankets for your bed that hang to the floor. Have a firm chair with side arms that you can use for support when you get dressed. What can I do in the kitchen? Clean up any spills right away. If you need to reach something above you, use a step stool with a grab bar. Keep electrical cords out of the way. Do not use floor polish or wax that makes floors slippery. What can I do with my stairs? Do not leave any items on the stairs. Make sure that you have a light switch at the top and the bottom of the stairs. Make sure that there are handrails on both sides of the stairs. Fix handrails that are broken or loose. Install nonslip stair treads on all your stairs. Avoid having throw rugs at the top or bottom of the stairs. Choose a carpet that does not hide the edge of the steps on the stairs. Check carpeting to make sure that it is firmly attached to the stairs. Fix carpet that is loose or worn. What can I do on the outside of my home? Use bright outdoor lighting. Fix the edges of walkways and driveways and fix any cracks. Remove anything that might make you trip as you walk through a door, such as a raised step or threshold. Trim any bushes or trees on paths to your home. Check to see if handrails are loose or broken and that both sides of all steps have handrails. Install guardrails along the edges of any raised decks and porches. Clear paths of anything that can make you trip, such as tools or rocks. Have leaves, snow, or ice cleared regularly. Use sand or salt on paths during winter. Clean up any spills in your garage right away. This includes grease or oil spills. What other actions can I take? Wear shoes that: Have a low heel. Do not wear high heels. Have rubber bottoms. Feel good on your  feet and fit well. Are closed at the toe. Do not wear open-toe sandals. Use tools that help you move around if needed. These include: Canes. Walkers. Scooters. Crutches. Review your medicines with your doctor. Some medicines can make you feel dizzy. This can increase your chance of falling. Ask your doctor what else you can do to help prevent falls. Where to find more information Centers for Disease Control and Prevention, [employer]: http://www.wolf.info/ National Institute on Aging: http://kim-miller.com/ Contact a doctor if: You are afraid of falling at home. You feel weak, drowsy, or dizzy at home. You fall at home. Summary There are many simple things that you can do to make your home safe and to help prevent falls. Ways to make your home safe include removing things that can make you trip and installing grab bars in the bathroom. Ask for help when making these changes in your home. This information is not intended to replace advice given to you by your health care provider. Make sure you discuss any questions you have with your healthcare provider. Document Revised: 09/09/2019 Document Reviewed: 09/09/2019 Elsevier Patient Education  [place] Maintenance, Female Adopting a healthy lifestyle and  getting preventive care are important in promoting health and wellness. Ask your health care provider about: The right schedule for you to have regular tests and exams. Things you can do on your own to prevent diseases and keep yourself healthy. What should I know about diet, weight, and exercise? Eat a healthy diet  Eat a diet that includes plenty of vegetables, fruits, low-fat dairy products, and lean protein. Do not eat a lot of foods that are high in solid fats, added sugars, or sodium.  Maintain a healthy weight Body mass index (BMI) is used to identify weight problems. It estimates body fat based on height and weight. Your health care provider can help determineyour BMI and help  you achieve or maintain a healthy weight. Get regular exercise Get regular exercise. This is one of the most important things you can do for your health. Most adults should: Exercise for at least 150 minutes each week. The exercise should increase your heart rate and make you sweat (moderate-intensity exercise). Do strengthening exercises at least twice a week. This is in addition to the moderate-intensity exercise. Spend less time sitting. Even light physical activity can be beneficial. Watch cholesterol and blood lipids Have your blood tested for lipids and cholesterol at 41 years of age, then havethis test every 5 years. Have your cholesterol levels checked more often if: Your lipid or cholesterol levels are high. You are older than 41 years of age. You are at high risk for heart disease. What should I know about cancer screening? Depending on your health history and family history, you may need to have cancer screening at various ages. This may include screening for: Breast cancer. Cervical cancer. Colorectal cancer. Skin cancer. Lung cancer. What should I know about heart disease, diabetes, and high blood pressure? Blood pressure and heart disease High blood pressure causes heart disease and increases the risk of stroke. This is more likely to develop in people who have high blood pressure readings, are of African descent, or are overweight. Have your blood pressure checked: Every 3-5 years if you are 14-57 years of age. Every year if you are 75 years old or older. Diabetes Have regular diabetes screenings. This checks your fasting blood sugar level. Have the screening done: Once every three years after age 16 if you are at a normal weight and have a low risk for diabetes. More often and at a younger age if you are overweight or have a high risk for diabetes. What should I know about preventing infection? Hepatitis B If you have a higher risk for hepatitis B, you should be  screened for this virus. Talk with your health care provider to find out if you are at risk forhepatitis B infection. Hepatitis C Testing is recommended for: Everyone born from [birth year] through 1965. Anyone with known risk factors for hepatitis C. Sexually transmitted infections (STIs) Get screened for STIs, including gonorrhea and chlamydia, if: You are sexually active and are younger than 41 years of age. You are older than 41 years of age and your health care provider tells you that you are at risk for this type of infection. Your sexual activity has changed since you were last screened, and you are at increased risk for chlamydia or gonorrhea. Ask your health care provider if you are at risk. Ask your health care provider about whether you are at high risk for HIV. Your health care provider may recommend a prescription medicine to help prevent HIV infection. If you choose to  take medicine to prevent HIV, you should first get tested for HIV. You should then be tested every 3 months for as long as you are taking the medicine. Pregnancy If you are about to stop having your period (premenopausal) and you may become pregnant, seek counseling before you get pregnant. Take 400 to 800 micrograms (mcg) of folic acid every day if you become pregnant. Ask for birth control (contraception) if you want to prevent pregnancy. Osteoporosis and menopause Osteoporosis is a disease in which the bones lose minerals and strength with aging. This can result in bone fractures. If you are 38 years old or older, or if you are at risk for osteoporosis and fractures, ask your health care provider if you should: Be screened for bone loss. Take a calcium or vitamin D supplement to lower your risk of fractures. Be given hormone replacement therapy (HRT) to treat symptoms of menopause. Follow these instructions at home: Lifestyle Do not use any products that contain nicotine or tobacco, such as cigarettes, e-cigarettes, and  chewing tobacco. If you need help quitting, ask your health care provider. Do not use street drugs. Do not share needles. Ask your health care provider for help if you need support or information about quitting drugs. Alcohol use Do not drink alcohol if: Your health care provider tells you not to drink. You are pregnant, may be pregnant, or are planning to become pregnant. If you drink alcohol: Limit how much you use to 0-1 drink a day. Limit intake if you are breastfeeding. Be aware of how much alcohol is in your drink. In the [place] one drink equals one 12 oz bottle of beer (355 mL), one 5 oz glass of wine (148 mL), or one 1 oz glass of hard liquor (44 mL). General instructions Schedule regular health, dental, and eye exams. Stay current with your vaccines. Tell your health care provider if: You often feel depressed. You have ever been abused or do not feel safe at home. Summary Adopting a healthy lifestyle and getting preventive care are important in promoting health and wellness. Follow your health care provider's instructions about healthy diet, exercising, and getting tested or screened for diseases. Follow your health care provider's instructions on monitoring your cholesterol and blood pressure. This information is not intended to replace advice given to you by your health care provider. Make sure you discuss any questions you have with your healthcare provider. Document Revised: 01/29/2018 Document Reviewed: 01/29/2018 Elsevier Patient Education  2022 [employer].

## 2020-09-21 ENCOUNTER — Encounter: Payer: Self-pay | Admitting: *Deleted

## 2020-09-21 ENCOUNTER — Telehealth: Payer: Self-pay | Admitting: *Deleted

## 2020-09-21 NOTE — Telephone Encounter (Signed)
Call  to patient- reviewed surgery date options. Surgery scheduled for 8-16 at 2pm and arrive at 12pm.  Pre-op instructions reviewed and advised will receive call from facility pre-op nurse. Letter to follow in mail with hospital brochure.  Encounter closed.

## 2020-09-22 NOTE — Progress Notes (Signed)
I discussed the AWV findings with the RN who conducted the visit. I was present in the office suite and immediately available to provide assistance and direction throughout the time the service was provided.   Lawerance Cruel, D.O.  Internal Medicine Resident, PGY-3 [name]  Pager: [phone] 8:18 AM, 09/22/2020   **Please contact the on call pager after 5 pm and on weekends at [phone].**

## 2020-09-23 NOTE — Progress Notes (Signed)
Internal Medicine Clinic Attending  Case discussed with Dr.  Marianna Payment  at the time of the visit.  We reviewed the AWV findings.  I agree with the assessment, diagnosis, and plan of care documented in the AWV note.

## 2020-09-27 NOTE — Progress Notes (Signed)
Internal Medicine Clinic Attending  Case discussed with Dr.  Marianna Payment . We reviewed the AWV findings.  I agree with the assessment, diagnosis, and plan of care documented in the AWV note.

## 2020-09-28 ENCOUNTER — Other Ambulatory Visit: Payer: Self-pay

## 2020-09-28 ENCOUNTER — Other Ambulatory Visit: Payer: Self-pay | Admitting: Obstetrics and Gynecology

## 2020-09-28 ENCOUNTER — Ambulatory Visit (INDEPENDENT_AMBULATORY_CARE_PROVIDER_SITE_OTHER): Payer: Medicare Other | Admitting: Licensed Clinical Social Worker

## 2020-09-28 DIAGNOSIS — F3132 Bipolar disorder, current episode depressed, moderate: Secondary | ICD-10-CM | POA: Diagnosis not present

## 2020-09-28 DIAGNOSIS — F4310 Post-traumatic stress disorder, unspecified: Secondary | ICD-10-CM | POA: Diagnosis not present

## 2020-09-28 NOTE — Progress Notes (Signed)
Pre-op orders placed

## 2020-09-29 ENCOUNTER — Encounter (HOSPITAL_BASED_OUTPATIENT_CLINIC_OR_DEPARTMENT_OTHER): Payer: Self-pay | Admitting: Obstetrics and Gynecology

## 2020-09-29 ENCOUNTER — Encounter (HOSPITAL_COMMUNITY): Payer: Self-pay | Admitting: Licensed Clinical Social Worker

## 2020-09-29 ENCOUNTER — Other Ambulatory Visit: Payer: Self-pay

## 2020-09-29 NOTE — Progress Notes (Signed)
Virtual Visit via Video Note  I connected with AVONDA TOSO on 09/29/20 at  1:30 PM EDT by a video enabled telemedicine application and verified that I am speaking with the correct person using two identifiers.  Location: Patient: work Provider: home office   I discussed the limitations of evaluation and management by telemedicine and the availability of in person appointments. The patient expressed understanding and agreed to proceed.    Type of Therapy: Individual Therapy   Treatment Goals addressed: "help me cope with my mood and all of the changes I am going through". Quina will experience fewer incidents of mood outbursts, anger, and frustration at home 4 out of 7 days, per self report.    Interventions: CBT and Motivational Interviewing   Summary: ELAJAH KUNSMAN is a 40 y.o. female who presents with Moderate mixed bipolar I disorder, PTSD   Suicidal/Homicidal: No -without intent/plan   Therapist Response:   Florentine met with clinician for an individual session. Joe discussed her psychiatric symptoms, her current life events and her homework. Ryelle shared that her PTSD is really being triggered by her niece. Clinician explored sxs and experiences of flashbacks and nightmares, guilt, and helplessness. Clinician utilized CBT to provide psychoeducation about PTSD sxs and coping skills for reality testing. Clinician discussed decision-making processes and shared an article about different coping skills for managing Bipolar Disorder and impulsivity. Clinician reflected the urge to cope with negative behaviors, such as drinking or talking to other women. Clinician reminded Berry to be mindful of her own heart and to not seek solace outside of her relationship. Clinician explored updates about girlfriend moving here. Johniece shared discouragement in that area as well. Clinician identified the importance of seeking out the positive outcomes she really wants and using visualization and  meditation to assist her making good choices.     Plan: Return again in 2-3 weeks.     Diagnosis: Axis I: Moderate mixed bipolar I disorder, PTSD      I discussed the assessment and treatment plan with the patient. The patient was provided an opportunity to ask questions and all were answered. The patient agreed with the plan and demonstrated an understanding of the instructions.   The patient was advised to call back or seek an in-person evaluation if the symptoms worsen or if the condition fails to improve as anticipated.  I provided 50 minutes of non-face-to-face time during this encounter.   [name], LCSW

## 2020-09-29 NOTE — Progress Notes (Signed)
Spoke w/ via phone for pre-op interview---pt Lab needs dos----    cbc, I stat t & s, ekg, ujrin preg           Lab results------none COVID test -----patient states asymptomatic no test needed Arrive at -------1215 pm 10-04-2020 NPO after MN NO Solid Food.  Clear liquids from MN until---1115-am then npo Med rec completed Medications to take morning of surgery -----lyrica, depakote, zyrtec d, hydroxyazine Diabetic medication -----n/a Patient instructed no nail polish to be worn day of surgery Patient instructed to bring photo id and insurance card day of surgery Patient aware to have Driver (ride ) / caregiver  niece blakelie uehara   for 24 hours after surgery  Patient Special Instructions -----none Pre-Op special Istructions -----none Patient verbalized understanding of instructions that were given at this phone interview. Patient denies shortness of breath, chest pain, fever, cough at this phone interview.

## 2020-10-03 ENCOUNTER — Other Ambulatory Visit: Payer: Self-pay

## 2020-10-03 ENCOUNTER — Ambulatory Visit (INDEPENDENT_AMBULATORY_CARE_PROVIDER_SITE_OTHER): Payer: Medicare Other | Admitting: Obstetrics and Gynecology

## 2020-10-03 VITALS — BP 93/63 | HR 70 | Ht 69.0 in | Wt 256.0 lb

## 2020-10-03 DIAGNOSIS — Z01818 Encounter for other preprocedural examination: Secondary | ICD-10-CM

## 2020-10-03 DIAGNOSIS — N920 Excessive and frequent menstruation with regular cycle: Secondary | ICD-10-CM

## 2020-10-03 DIAGNOSIS — D250 Submucous leiomyoma of uterus: Secondary | ICD-10-CM

## 2020-10-03 DIAGNOSIS — Z0000 Encounter for general adult medical examination without abnormal findings: Secondary | ICD-10-CM | POA: Insufficient documentation

## 2020-10-03 MED ORDER — MISOPROSTOL 100 MCG PO TABS: ORAL_TABLET | ORAL | 0 refills | Status: DC

## 2020-10-03 NOTE — H&P (View-Only) (Signed)
OB/GYN Pre-Op History and Physical  Jordan [name] is a 40 y.o. G0P0000 presenting for preoperative encounter for Sonata procedure. Procedure discussed in detail along with realistic expectations after the procedure ie decrease in fibroid size and improvement in bleeding 3-6 months post procedure.      Past Medical History:  Diagnosis Date   Alcohol abuse    Allergic rhinitis    pt takes flonase for episodes, no episodes in 2012   Anemia    Anxiety    Basal cell carcinoma    recurrent in left maxillary, has had 5 surguries, last one 2003   Basal cell nevus syndrome    dx age 60   Bipolar 1 disorder ([place])    Candidiasis, vagina    recurrent, pt has made lifestyle modifications and none recently (as of 8/12)   Cervicitis 03/19/2019   Cyst of nasal sinus    Depression    B-Polar   Fibroma    left ovarian   Fibromyalgia    Gardnerella infection    + in 08/08   GERD (gastroesophageal reflux disease)    occ   Herniated disc    Hyperlipidemia    Joint pain    Lactose intolerance    Obesity    OVARIAN CYSTECTOMY, HX OF 03/01/2006   ovarian fibroma-left 1996, L ovary and fallopian tube removed     Paronychia of third finger of left hand 07/28/2010   PLANTAR FASCIITIS, BILATERAL 05/24/2009   Pre-diabetes    PTSD (post-traumatic stress disorder)    [place]    hx of herpetic requiring I+D,( was bitten by autistic child that cares fr    Past Surgical History:  Procedure Laterality Date   CHOLECYSTECTOMY  1999   colonscopy  07/2020   f/u in 5 yrs   LEFT OOPHORECTOMY     age 36   LUMBAR LAMINECTOMY/DECOMPRESSION MICRODISCECTOMY N/A 08/14/2012   Procedure: LUMBAR LAMINECTOMY/DECOMPRESSION MICRODISCECTOMY Lumbar 5 -sacrum 1 decompression;  Surgeon: Sinclair Ship, MD;  Location: [facility];  Service: Orthopedics;  Laterality: N/A;  Lumbar 5 -sacrum 1 decompression   MANDIBLE RECONSTRUCTION  1992   upper anfd lower jaw cyst   NASAL SINUS SURGERY     X 2 at age 48 & 45     OB History  Gravida Para Term Preterm AB Living  0 0 0 0 0 0  SAB IAB Ectopic Multiple Live Births  0 0 0 0      Social History   Socioeconomic History   Marital status: Single    Spouse name: Not on file   Number of children: 0   Years of education: 13   Highest education level: Some college, no degree  Occupational History   Occupation: disabled    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 There are 4 steps up to the entrance the patient uses. There is a railing.      Patient's  method of transportation is personal car.      The highest level of education was some college.      The patient currently disabled but working part time       Identified important Relationships are "2 nieces and my best friend"       Pets : 1 dog, yorkie named Tax inspector / Fun: "Umm I don't know I am trying to find some things"      Current Stressors: "finances and trying to keep this weight off"       Religious / Personal Beliefs: "Christianity"       Social Determinants of Health   Financial Resource Strain: Not on file  Food Insecurity: Not on file  Transportation Needs: Not on file  Physical Activity: Not on file  Stress: Not on file  Social Connections: Not on file    Family History  Problem Relation Age of Onset   Bipolar disorder Mother    Alcohol abuse Mother    Drug abuse Mother    Other Mother        DDD   Hypertension Mother    Hyperlipidemia Mother    Depression Mother    Anxiety disorder Mother     Alcoholism Mother    Colon polyps Mother    Bipolar disorder Father    Other Father        basal cell nevus syndrome   Depression Father    Drug abuse Sister    Colon polyps Sister    Drug abuse Brother    Other Brother        basal cell syndrome   Breast cancer Maternal Aunt    Breast cancer Maternal Aunt    Diabetes Maternal Grandmother    Heart attack Maternal Grandmother    Cancer Maternal Grandmother        stomach   Stomach cancer Other        uncle   Cancer Other        unknown grandfather   Diabetes Other        grandmother, aunt and 2 uncles   Colon cancer Other        cousin at age 43    (Not in a hospital admission)   No Known Allergies  Review of Systems: Negative except for what is mentioned in HPI.     Physical Exam: BP 93/63   Pulse 70   Ht '5\' 9"'$  (1.753 m)   Wt 256 lb (116.1 kg)   LMP 09/08/2020 (Exact Date)   BMI 37.80 kg/m  CONSTITUTIONAL: Well-developed, well-nourished female in no acute distress.  HENT:  Normocephalic, atraumatic, External right and left ear normal. Oropharynx is clear and moist EYES: Conjunctivae and EOM are normal.  NECK: Normal range of motion, supple, no masses SKIN: Skin is warm and dry. No rash noted. Not diaphoretic. No erythema. No pallor. [place]: Alert and oriented to person, place, and time. Normal reflexes, muscle tone coordination. No cranial nerve deficit noted. PSYCHIATRIC: Normal mood and affect. Normal behavior. Normal judgment and thought content. CARDIOVASCULAR: Normal heart rate noted, regular rhythm RESPIRATORY: Effort and breath sounds normal, no problems with respiration noted ABDOMEN: Soft, nontender, nondistended, gravid.  PELVIC: SSE: cervix visualized with small os secondary to nuliiparity MUSCULOSKELETAL: Normal range of motion. No edema and no tenderness. 2+ distal pulses.   Pertinent Labs/Studies:   Endometrial biopsy negative 07/2020 CLINICAL DATA:  40 year old female with abnormal  uterine bleeding.  EXAM: ULTRASOUND PELVIS TRANSVAGINAL   TECHNIQUE: Transvaginal ultrasound examination of the pelvis was performed including evaluation of the uterus, ovaries, adnexal regions, and pelvic cul-de-sac.   COMPARISON:  None.   FINDINGS: Uterus   Measurements: 9.2 x 7.8 x 6.7 cm = volume: 252 mL. The uterus is retroverted and anteflexed and demonstrates a heterogeneous echotexture with multiple fibroids. There is a 3.3 x 3.4 x 3.0 cm fundal intramural fibroid. A 4.5 x 3.8 x 4.5 cm right fundal submucosal, partially exophytic fibroid and a 1.8 x 2.3 x 1.9 cm left fundal submucosal fibroid.   Endometrium   Thickness: 8 mm. The endometrium is poorly visualized and suboptimally evaluated. There is however apparent distortion of the midportion of the endometrium likely secondary to submucosal fibroids.   Right ovary   Measurements: 3.6 x 2.8 x 2.2 cm = volume: 12 mL. A 2.6 x 1.9 x 1.7 cm cystic structure with uniform low-level echoes may represent an endometrioma or a complex/hemorrhagic cyst. There is however apparent area of wall thickening in the periphery of the cyst.   Left ovary   Oophorectomy.   Other findings:  Small free fluid within the pelvis.   IMPRESSION: 1. Heterogeneous uterus with multiple fibroids as above. 2. Distorted appearance of the endometrium, likely related to submucosal fibroid. 3. A complex/hemorrhagic cyst versus an endometrioma in the right ovary. Follow-up with ultrasound in 8-10 weeks recommended. 4. Left oophorectomy.       Assessment and Plan :Jordan [name] is a 40 y.o. G0P0000 here for preoperative evaluation for Sonata procedure. Risks and benefits of the procedure have been given including bleeding, infection, involvement of other organs, uterine perforation or loss of the organ. Post procedural events such as cramping, vaginal discharge, and sloughing of fibroid material have also been discussed.  Pt wishes to  proceed.     Plan for Sonata fibroid radio ablation NPO Admission labs ordered Due to small cervical os, pt advised to pretreat with cytotec 50 mcg the night before and the morning of the procedure.  Rx has been sent.   Lynnda Shields, M.D. Attending [place], [facility] for [employer], [place]

## 2020-10-03 NOTE — Progress Notes (Signed)
OB/GYN Pre-Op History and Physical  Jordan [name] is a 40 y.o. G0P0000 presenting for preoperative encounter for Sonata procedure. Procedure discussed in detail along with realistic expectations after the procedure ie decrease in fibroid size and improvement in bleeding 3-6 months post procedure.      Past Medical History:  Diagnosis Date   Alcohol abuse    Allergic rhinitis    pt takes flonase for episodes, no episodes in 2012   Anemia    Anxiety    Basal cell carcinoma    recurrent in left maxillary, has had 5 surguries, last one 2003   Basal cell nevus syndrome    dx age 29   Bipolar 1 disorder ([place])    Candidiasis, vagina    recurrent, pt has made lifestyle modifications and none recently (as of 8/12)   Cervicitis 03/19/2019   Cyst of nasal sinus    Depression    B-Polar   Fibroma    left ovarian   Fibromyalgia    Gardnerella infection    + in 08/08   GERD (gastroesophageal reflux disease)    occ   Herniated disc    Hyperlipidemia    Joint pain    Lactose intolerance    Obesity    OVARIAN CYSTECTOMY, HX OF 03/01/2006   ovarian fibroma-left 1996, L ovary and fallopian tube removed     Paronychia of third finger of left hand 07/28/2010   PLANTAR FASCIITIS, BILATERAL 05/24/2009   Pre-diabetes    PTSD (post-traumatic stress disorder)    [place]    hx of herpetic requiring I+D,( was bitten by autistic child that cares fr    Past Surgical History:  Procedure Laterality Date   CHOLECYSTECTOMY  1999   colonscopy  07/2020   f/u in 5 yrs   LEFT OOPHORECTOMY     age 71   LUMBAR LAMINECTOMY/DECOMPRESSION MICRODISCECTOMY N/A 08/14/2012   Procedure: LUMBAR LAMINECTOMY/DECOMPRESSION MICRODISCECTOMY Lumbar 5 -sacrum 1 decompression;  Surgeon: Sinclair Ship, MD;  Location: [facility];  Service: Orthopedics;  Laterality: N/A;  Lumbar 5 -sacrum 1 decompression   MANDIBLE RECONSTRUCTION  1992   upper anfd lower jaw cyst   NASAL SINUS SURGERY     X 2 at age 37 & 8     OB History  Gravida Para Term Preterm AB Living  0 0 0 0 0 0  SAB IAB Ectopic Multiple Live Births  0 0 0 0      Social History   Socioeconomic History   Marital status: Single    Spouse name: Not on file   Number of children: 0   Years of education: 13   Highest education level: Some college, no degree  Occupational History   Occupation: disabled    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 There are 4 steps up to the entrance the patient uses. There is a railing.      Patient's  method of transportation is personal car.      The highest level of education was some college.      The patient currently disabled but working part time       Identified important Relationships are "2 nieces and my best friend"       Pets : 1 dog, yorkie named Tax inspector / Fun: "Umm I don't know I am trying to find some things"      Current Stressors: "finances and trying to keep this weight off"       Religious / Personal Beliefs: "Christianity"       Social Determinants of Health   Financial Resource Strain: Not on file  Food Insecurity: Not on file  Transportation Needs: Not on file  Physical Activity: Not on file  Stress: Not on file  Social Connections: Not on file    Family History  Problem Relation Age of Onset   Bipolar disorder Mother    Alcohol abuse Mother    Drug abuse Mother    Other Mother        DDD   Hypertension Mother    Hyperlipidemia Mother    Depression Mother    Anxiety disorder Mother     Alcoholism Mother    Colon polyps Mother    Bipolar disorder Father    Other Father        basal cell nevus syndrome   Depression Father    Drug abuse Sister    Colon polyps Sister    Drug abuse Brother    Other Brother        basal cell syndrome   Breast cancer Maternal Aunt    Breast cancer Maternal Aunt    Diabetes Maternal Grandmother    Heart attack Maternal Grandmother    Cancer Maternal Grandmother        stomach   Stomach cancer Other        uncle   Cancer Other        unknown grandfather   Diabetes Other        grandmother, aunt and 2 uncles   Colon cancer Other        cousin at age 14    (Not in a hospital admission)   No Known Allergies  Review of Systems: Negative except for what is mentioned in HPI.     Physical Exam: BP 93/63   Pulse 70   Ht '5\' 9"'$  (1.753 m)   Wt 256 lb (116.1 kg)   LMP 09/08/2020 (Exact Date)   BMI 37.80 kg/m  CONSTITUTIONAL: Well-developed, well-nourished female in no acute distress.  HENT:  Normocephalic, atraumatic, External right and left ear normal. Oropharynx is clear and moist EYES: Conjunctivae and EOM are normal.  NECK: Normal range of motion, supple, no masses SKIN: Skin is warm and dry. No rash noted. Not diaphoretic. No erythema. No pallor. [place]: Alert and oriented to person, place, and time. Normal reflexes, muscle tone coordination. No cranial nerve deficit noted. PSYCHIATRIC: Normal mood and affect. Normal behavior. Normal judgment and thought content. CARDIOVASCULAR: Normal heart rate noted, regular rhythm RESPIRATORY: Effort and breath sounds normal, no problems with respiration noted ABDOMEN: Soft, nontender, nondistended, gravid.  PELVIC: SSE: cervix visualized with small os secondary to nuliiparity MUSCULOSKELETAL: Normal range of motion. No edema and no tenderness. 2+ distal pulses.   Pertinent Labs/Studies:   Endometrial biopsy negative 07/2020 CLINICAL DATA:  40 year old female with abnormal  uterine bleeding.  EXAM: ULTRASOUND PELVIS TRANSVAGINAL   TECHNIQUE: Transvaginal ultrasound examination of the pelvis was performed including evaluation of the uterus, ovaries, adnexal regions, and pelvic cul-de-sac.   COMPARISON:  None.   FINDINGS: Uterus   Measurements: 9.2 x 7.8 x 6.7 cm = volume: 252 mL. The uterus is retroverted and anteflexed and demonstrates a heterogeneous echotexture with multiple fibroids. There is a 3.3 x 3.4 x 3.0 cm fundal intramural fibroid. A 4.5 x 3.8 x 4.5 cm right fundal submucosal, partially exophytic fibroid and a 1.8 x 2.3 x 1.9 cm left fundal submucosal fibroid.   Endometrium   Thickness: 8 mm. The endometrium is poorly visualized and suboptimally evaluated. There is however apparent distortion of the midportion of the endometrium likely secondary to submucosal fibroids.   Right ovary   Measurements: 3.6 x 2.8 x 2.2 cm = volume: 12 mL. A 2.6 x 1.9 x 1.7 cm cystic structure with uniform low-level echoes may represent an endometrioma or a complex/hemorrhagic cyst. There is however apparent area of wall thickening in the periphery of the cyst.   Left ovary   Oophorectomy.   Other findings:  Small free fluid within the pelvis.   IMPRESSION: 1. Heterogeneous uterus with multiple fibroids as above. 2. Distorted appearance of the endometrium, likely related to submucosal fibroid. 3. A complex/hemorrhagic cyst versus an endometrioma in the right ovary. Follow-up with ultrasound in 8-10 weeks recommended. 4. Left oophorectomy.       Assessment and Plan :Jordan [name] is a 40 y.o. G0P0000 here for preoperative evaluation for Sonata procedure. Risks and benefits of the procedure have been given including bleeding, infection, involvement of other organs, uterine perforation or loss of the organ. Post procedural events such as cramping, vaginal discharge, and sloughing of fibroid material have also been discussed.  Pt wishes to  proceed.     Plan for Sonata fibroid radio ablation NPO Admission labs ordered Due to small cervical os, pt advised to pretreat with cytotec 50 mcg the night before and the morning of the procedure.  Rx has been sent.   Lynnda Shields, M.D. Attending [place], [facility] for [employer], [place]

## 2020-10-03 NOTE — Progress Notes (Signed)
Pre-op appt, RE Fibroids.

## 2020-10-04 ENCOUNTER — Ambulatory Visit (HOSPITAL_BASED_OUTPATIENT_CLINIC_OR_DEPARTMENT_OTHER): Payer: Medicare Other | Admitting: Anesthesiology

## 2020-10-04 ENCOUNTER — Ambulatory Visit (INDEPENDENT_AMBULATORY_CARE_PROVIDER_SITE_OTHER): Payer: Medicare Other | Admitting: Licensed Clinical Social Worker

## 2020-10-04 ENCOUNTER — Ambulatory Visit (HOSPITAL_BASED_OUTPATIENT_CLINIC_OR_DEPARTMENT_OTHER)
Admission: RE | Admit: 2020-10-04 | Discharge: 2020-10-04 | Disposition: A | Discharge status: Home / Self Care | Payer: Medicare Other | Attending: Obstetrics and Gynecology | Admitting: Obstetrics and Gynecology

## 2020-10-04 ENCOUNTER — Encounter (HOSPITAL_BASED_OUTPATIENT_CLINIC_OR_DEPARTMENT_OTHER)
Admission: RE | Disposition: A | Discharge status: Home / Self Care | Payer: Self-pay | Source: Home / Self Care | Attending: Obstetrics and Gynecology

## 2020-10-04 ENCOUNTER — Encounter (HOSPITAL_BASED_OUTPATIENT_CLINIC_OR_DEPARTMENT_OTHER): Payer: Self-pay | Admitting: Obstetrics and Gynecology

## 2020-10-04 DIAGNOSIS — E785 Hyperlipidemia, unspecified: Secondary | ICD-10-CM | POA: Diagnosis not present

## 2020-10-04 DIAGNOSIS — Z85828 Personal history of other malignant neoplasm of skin: Secondary | ICD-10-CM | POA: Diagnosis not present

## 2020-10-04 DIAGNOSIS — F4310 Post-traumatic stress disorder, unspecified: Secondary | ICD-10-CM | POA: Diagnosis not present

## 2020-10-04 DIAGNOSIS — F3132 Bipolar disorder, current episode depressed, moderate: Secondary | ICD-10-CM | POA: Diagnosis not present

## 2020-10-04 DIAGNOSIS — N920 Excessive and frequent menstruation with regular cycle: Secondary | ICD-10-CM | POA: Diagnosis present

## 2020-10-04 DIAGNOSIS — D259 Leiomyoma of uterus, unspecified: Secondary | ICD-10-CM | POA: Diagnosis not present

## 2020-10-04 DIAGNOSIS — D250 Submucous leiomyoma of uterus: Secondary | ICD-10-CM | POA: Diagnosis not present

## 2020-10-04 DIAGNOSIS — D251 Intramural leiomyoma of uterus: Secondary | ICD-10-CM | POA: Insufficient documentation

## 2020-10-04 DIAGNOSIS — D219 Benign neoplasm of connective and other soft tissue, unspecified: Secondary | ICD-10-CM

## 2020-10-04 DIAGNOSIS — K219 Gastro-esophageal reflux disease without esophagitis: Secondary | ICD-10-CM | POA: Diagnosis not present

## 2020-10-04 DIAGNOSIS — M797 Fibromyalgia: Secondary | ICD-10-CM | POA: Diagnosis not present

## 2020-10-04 HISTORY — PX: HYSTEROSCOPY: SHX211

## 2020-10-04 HISTORY — DX: Anemia, unspecified: D64.9

## 2020-10-04 HISTORY — DX: Prediabetes: R73.03

## 2020-10-04 LAB — TYPE AND SCREEN
ABO/RH(D): AB POS
Antibody Screen: NEGATIVE

## 2020-10-04 LAB — CBC
HCT: 42.4 % (ref 36.0–46.0)
Hemoglobin: 14.1 g/dL (ref 12.0–15.0)
MCH: 29.7 pg (ref 26.0–34.0)
MCHC: 33.3 g/dL (ref 30.0–36.0)
MCV: 89.5 fL (ref 80.0–100.0)
Platelets: 207 10*3/uL (ref 150–400)
RBC: 4.74 MIL/uL (ref 3.87–5.11)
RDW: 14.5 % (ref 11.5–15.5)
WBC: 5.5 10*3/uL (ref 4.0–10.5)
nRBC: 0 % (ref 0.0–0.2)

## 2020-10-04 LAB — POCT I-STAT, CHEM 8
BUN: 10 mg/dL (ref 6–20)
Calcium, Ion: 1.18 mmol/L (ref 1.15–1.40)
Chloride: 106 mmol/L (ref 98–111)
Creatinine, Ser: 0.7 mg/dL (ref 0.44–1.00)
Glucose, Bld: 82 mg/dL (ref 70–99)
HCT: 42 % (ref 36.0–46.0)
Hemoglobin: 14.3 g/dL (ref 12.0–15.0)
Potassium: 3.8 mmol/L (ref 3.5–5.1)
Sodium: 138 mmol/L (ref 135–145)
TCO2: 23 mmol/L (ref 22–32)

## 2020-10-04 LAB — POCT PREGNANCY, URINE: Preg Test, Ur: NEGATIVE

## 2020-10-04 SURGERY — HYSTEROSCOPY
Anesthesia: General | Site: Vagina

## 2020-10-04 MED ORDER — DEXAMETHASONE SODIUM PHOSPHATE 4 MG/ML IJ SOLN
INTRAMUSCULAR | Status: DC | PRN
  Administered 2020-10-04: 8 mg via INTRAVENOUS

## 2020-10-04 MED ORDER — SODIUM CHLORIDE 0.9 % IR SOLN
Status: DC | PRN
  Administered 2020-10-04: 3000 mL

## 2020-10-04 MED ORDER — OXYCODONE HCL 5 MG PO TABS
ORAL_TABLET | ORAL | Status: AC
  Filled 2020-10-04: qty 1

## 2020-10-04 MED ORDER — ACETAMINOPHEN 325 MG PO TABS: 325.0000 mg | ORAL_TABLET | ORAL | Status: DC | PRN

## 2020-10-04 MED ORDER — MIDAZOLAM HCL 2 MG/2ML IJ SOLN
INTRAMUSCULAR | Status: AC
  Filled 2020-10-04: qty 2

## 2020-10-04 MED ORDER — KETOROLAC TROMETHAMINE 15 MG/ML IJ SOLN: 15.0000 mg | INTRAMUSCULAR | Status: DC

## 2020-10-04 MED ORDER — FENTANYL CITRATE (PF) 100 MCG/2ML IJ SOLN
INTRAMUSCULAR | Status: DC | PRN
  Administered 2020-10-04: 100 ug via INTRAVENOUS

## 2020-10-04 MED ORDER — ACETAMINOPHEN-CODEINE #3 300-30 MG PO TABS
1.0000 | ORAL_TABLET | Freq: Four times a day (QID) | ORAL | 0 refills | Status: DC | PRN
Start: 2020-10-04 — End: 2021-02-21

## 2020-10-04 MED ORDER — SOD CITRATE-CITRIC ACID 500-334 MG/5ML PO SOLN: 30.0000 mL | ORAL | Status: DC

## 2020-10-04 MED ORDER — KETOROLAC TROMETHAMINE 30 MG/ML IJ SOLN
INTRAMUSCULAR | Status: DC | PRN
  Administered 2020-10-04: 30 mg via INTRAVENOUS

## 2020-10-04 MED ORDER — POVIDONE-IODINE 10 % EX SWAB: 2.0000 "application " | Freq: Once | CUTANEOUS | Status: DC

## 2020-10-04 MED ORDER — MIDAZOLAM HCL 2 MG/2ML IJ SOLN
INTRAMUSCULAR | Status: DC | PRN
  Administered 2020-10-04: 1 mg via INTRAVENOUS

## 2020-10-04 MED ORDER — IBUPROFEN 600 MG PO TABS: 600.0000 mg | ORAL_TABLET | Freq: Four times a day (QID) | ORAL | 2 refills | Status: DC | PRN

## 2020-10-04 MED ORDER — FENTANYL CITRATE (PF) 100 MCG/2ML IJ SOLN
INTRAMUSCULAR | Status: AC
  Filled 2020-10-04: qty 2

## 2020-10-04 MED ORDER — OXYCODONE HCL 5 MG PO TABS
5.0000 mg | ORAL_TABLET | Freq: Once | ORAL | Status: AC | PRN
  Administered 2020-10-04: 5 mg via ORAL

## 2020-10-04 MED ORDER — OXYCODONE HCL 5 MG/5ML PO SOLN
5.0000 mg | Freq: Once | ORAL | Status: AC | PRN
Start: 2020-10-04 — End: 2020-10-04

## 2020-10-04 MED ORDER — STERILE WATER FOR IRRIGATION IR SOLN
Status: DC | PRN
Start: 2020-10-04 — End: 2020-10-04
  Administered 2020-10-04: 500 mL

## 2020-10-04 MED ORDER — CEFAZOLIN SODIUM-DEXTROSE 2-4 GM/100ML-% IV SOLN
2.0000 g | INTRAVENOUS | Status: AC
  Administered 2020-10-04: 2 g via INTRAVENOUS

## 2020-10-04 MED ORDER — ACETAMINOPHEN 160 MG/5ML PO SOLN: 325.0000 mg | ORAL | Status: DC | PRN

## 2020-10-04 MED ORDER — PROPOFOL 10 MG/ML IV BOLUS
INTRAVENOUS | Status: DC | PRN
  Administered 2020-10-04: 200 mg via INTRAVENOUS

## 2020-10-04 MED ORDER — ONDANSETRON HCL 4 MG/2ML IJ SOLN
INTRAMUSCULAR | Status: DC | PRN
  Administered 2020-10-04: 4 mg via INTRAVENOUS

## 2020-10-04 MED ORDER — LIDOCAINE HCL 1 % IJ SOLN
INTRAMUSCULAR | Status: DC | PRN
  Administered 2020-10-04: 5 mL

## 2020-10-04 MED ORDER — LACTATED RINGERS IV SOLN: INTRAVENOUS | Status: DC

## 2020-10-04 MED ORDER — LIDOCAINE HCL (CARDIAC) PF 100 MG/5ML IV SOSY
PREFILLED_SYRINGE | INTRAVENOUS | Status: DC | PRN
  Administered 2020-10-04: 80 mg via INTRAVENOUS

## 2020-10-04 MED ORDER — ACETAMINOPHEN 10 MG/ML IV SOLN: 1000.0000 mg | Freq: Once | INTRAVENOUS | Status: DC | PRN

## 2020-10-04 MED ORDER — FENTANYL CITRATE (PF) 100 MCG/2ML IJ SOLN: 25.0000 ug | INTRAMUSCULAR | Status: DC | PRN

## 2020-10-04 MED ORDER — AMISULPRIDE (ANTIEMETIC) 5 MG/2ML IV SOLN: 10.0000 mg | Freq: Once | INTRAVENOUS | Status: DC | PRN

## 2020-10-04 MED ORDER — PROPOFOL 10 MG/ML IV BOLUS
INTRAVENOUS | Status: AC
  Filled 2020-10-04: qty 20

## 2020-10-04 MED ORDER — PROMETHAZINE HCL 25 MG/ML IJ SOLN: 6.2500 mg | INTRAMUSCULAR | Status: DC | PRN

## 2020-10-04 MED ORDER — GLYCOPYRROLATE 0.2 MG/ML IJ SOLN
INTRAMUSCULAR | Status: DC | PRN
Start: 2020-10-04 — End: 2020-10-04
  Administered 2020-10-04 (×2): .1 mg via INTRAVENOUS

## 2020-10-04 MED ORDER — GLYCOPYRROLATE PF 0.2 MG/ML IJ SOSY
PREFILLED_SYRINGE | INTRAMUSCULAR | Status: AC
  Filled 2020-10-04: qty 1

## 2020-10-04 SURGICAL SUPPLY — 21 items
CATH ROBINSON RED A/P 16FR (CATHETERS) ×2 IMPLANT
ELECT DISPERSIVE SONATA (MISCELLANEOUS) ×3 IMPLANT
GAUZE 4X4 16PLY ~~LOC~~+RFID DBL (SPONGE) ×2 IMPLANT
GLOVE SURG ENC MOIS LTX SZ8 (GLOVE) ×2 IMPLANT
GLOVE SURG NEOP MICRO LF SZ6.5 (GLOVE) ×2 IMPLANT
GLOVE SURG UNDER POLY LF SZ7 (GLOVE) ×5 IMPLANT
GLOVE SURG UNDER POLY LF SZ7.5 (GLOVE) ×1 IMPLANT
GOWN STRL REUS W/ TWL LRG LVL3 (GOWN DISPOSABLE) ×2 IMPLANT
GOWN STRL REUS W/TWL LRG LVL3 (GOWN DISPOSABLE) ×5 IMPLANT
GOWN STRL REUS W/TWL XL LVL3 (GOWN DISPOSABLE) ×1 IMPLANT
HANDPIECE RFA SONATA (MISCELLANEOUS) ×2 IMPLANT
KIT PROCEDURE FLUENT (KITS) ×2 IMPLANT
KIT TURNOVER CYSTO (KITS) ×2 IMPLANT
PACK VAGINAL MINOR WOMEN LF (CUSTOM PROCEDURE TRAY) ×2 IMPLANT
PAD OB MATERNITY 4.3X12.25 (PERSONAL CARE ITEMS) ×2 IMPLANT
PAD PREP 24X48 CUFFED NSTRL (MISCELLANEOUS) ×2 IMPLANT
SEAL ROD LENS SCOPE MYOSURE (ABLATOR) ×1 IMPLANT
SLEEVE SCD COMPRESS KNEE MED (STOCKING) ×2 IMPLANT
SYR 50ML LL SCALE MARK (SYRINGE) ×1 IMPLANT
TOWEL OR 17X26 10 PK STRL BLUE (TOWEL DISPOSABLE) ×3 IMPLANT
WATER STERILE IRR 500ML POUR (IV SOLUTION) ×1 IMPLANT

## 2020-10-04 NOTE — Anesthesia Procedure Notes (Addendum)
Procedure Name: LMA Insertion Date/Time: 10/04/2020 2:16 PM Performed by: Georgeanne Nim, CRNA Pre-anesthesia Checklist: Patient identified, Emergency Drugs available, Suction available, Patient being monitored and Timeout performed Patient Re-evaluated:Patient Re-evaluated prior to induction Oxygen Delivery Method: Circle system utilized Preoxygenation: Pre-oxygenation with 100% oxygen Induction Type: IV induction Ventilation: Mask ventilation without difficulty LMA: LMA inserted LMA Size: 4.0 Number of attempts: 1 Placement Confirmation: positive ETCO2, CO2 detector and breath sounds checked- equal and bilateral Tube secured with: Tape Dental Injury: Teeth and Oropharynx as per pre-operative assessment

## 2020-10-04 NOTE — Discharge Instructions (Addendum)
  Post Anesthesia Home Care Instructions  Activity: Get plenty of rest for the remainder of the day. A responsible individual must stay with you for 24 hours following the procedure.  For the next 24 hours, DO NOT: -Drive a car -Paediatric nurse -Drink alcoholic beverages -Take any medication unless instructed by your physician -Make any legal decisions or sign important papers.  Meals: Start with liquid foods such as gelatin or soup. Progress to regular foods as tolerated. Avoid greasy, spicy, heavy foods. If nausea and/or vomiting occur, drink only clear liquids until the nausea and/or vomiting subsides. Call your physician if vomiting continues.  Special Instructions/Symptoms: Your throat may feel dry or sore from the anesthesia or the breathing tube placed in your throat during surgery. If this causes discomfort, gargle with warm salt water. The discomfort should disappear within 24 hours.  DISCHARGE INSTRUCTIONS: HYSTEROSCOPY / ENDOMETRIAL ABLATION The following instructions have been prepared to help you care for yourself upon your return home.  May take Ibuprofen at 9:30 PM.  May take stool softner while taking narcotic pain medication to prevent constipation.  Drink plenty of water.  Personal hygiene:  Use sanitary pads for vaginal drainage, not tampons.  Shower the day after your procedure.  NO tub baths, pools or Jacuzzis for 2-3 weeks.  Wipe front to back after using the bathroom.  Activity and limitations:  Do NOT drive or operate any equipment for 24 hours. The effects of anesthesia are still present and drowsiness may result.  Do NOT rest in bed all day.  Walking is encouraged.  Walk up and down stairs slowly.  You may resume your normal activity in one to two days or as indicated by your physician. Sexual activity: NO intercourse for at least 2 weeks after the procedure, or as indicated by your Doctor.  Diet: Eat a light meal as desired this evening. You may  resume your usual diet tomorrow.  Return to Work: You may resume your work activities in one to two days or as indicated by Marine scientist.  What to expect after your surgery: Expect to have vaginal bleeding/discharge for 2-3 days and spotting for up to 10 days. It is not unusual to have soreness for up to 1-2 weeks. You may have a slight burning sensation when you urinate for the first day. Mild cramps may continue for a couple of days. You may have a regular period in 2-6 weeks.  Call your doctor for any of the following:  Excessive vaginal bleeding or clotting, saturating and changing one pad every hour.  Inability to urinate 6 hours after discharge from hospital.  Pain not relieved by pain medication.  Fever of 100.4 F or greater.  Unusual vaginal discharge or odor.

## 2020-10-04 NOTE — Anesthesia Preprocedure Evaluation (Addendum)
Anesthesia Evaluation  Patient identified by MRN, date of birth, ID band Patient awake    Reviewed: Allergy & Precautions, NPO status , Patient's Chart, lab work & pertinent test results  Airway Mallampati: I  TM Distance: >3 FB Neck ROM: Full    Dental  (+) Teeth Intact, Dental Advisory Given   Pulmonary neg pulmonary ROS,    Pulmonary exam normal        Cardiovascular negative cardio ROS   Rhythm:Regular Rate:Normal     Neuro/Psych PSYCHIATRIC DISORDERS Anxiety Depression Bipolar Disorder  Neuromuscular disease    GI/Hepatic Neg liver ROS, GERD  ,  Endo/Other  negative endocrine ROS  Renal/GU negative Renal ROS     Musculoskeletal  (+) Arthritis , Fibromyalgia -  Abdominal Normal abdominal exam  (+)   Peds  Hematology negative hematology ROS (+)   Anesthesia Other Findings   Reproductive/Obstetrics                            Anesthesia Physical Anesthesia Plan  ASA: 2  Anesthesia Plan: General   Post-op Pain Management:    Induction: Intravenous  PONV Risk Score and Plan: 4 or greater and Ondansetron, Dexamethasone, Scopolamine patch - Pre-op and Midazolam  Airway Management Planned: LMA  Additional Equipment: None  Intra-op Plan:   Post-operative Plan: Extubation in OR  Informed Consent: I have reviewed the patients History and Physical, chart, labs and discussed the procedure including the risks, benefits and alternatives for the proposed anesthesia with the patient or authorized representative who has indicated his/her understanding and acceptance.     Dental advisory given  Plan Discussed with: CRNA  Anesthesia Plan Comments:        Anesthesia Quick Evaluation

## 2020-10-04 NOTE — Progress Notes (Signed)
Virtual Visit via Video Note  I connected with Jordan [name] on 10/04/20 at  8:00 AM EDT by a video enabled telemedicine application and verified that I am speaking with the correct person using two identifiers.  Location: Patient: home Provider: home office   I discussed the limitations of evaluation and management by telemedicine and the availability of in person appointments. The patient expressed understanding and agreed to proceed.   Type of Therapy: Individual Therapy   Treatment Goals addressed: "help me cope with my mood and all of the changes I am going through". Jordan [name] will experience fewer incidents of mood outbursts, anger, and frustration at home 4 out of 7 days, per self report.    Interventions: CBT and Motivational Interviewing   Summary: Jordan [name] is a 40 y.o. female who presents with Moderate mixed bipolar I disorder, PTSD   Suicidal/Homicidal: No -without intent/plan   Therapist Response:   Jordan [name] met with clinician for an individual session. Jordan [name] discussed her psychiatric symptoms, her current life events and her homework. Jordan [name] shared that she will have a procedure to remove fibroids this afternoon and she was feeling anxious. Clinician processed thoughts and feelings about her concerns, assisted in identifying coping skills, and reminded her of the proposed benefits of the treatment. Clinician utilized CBT for reality testing. Clinician discussed the challenges of lesbians in coping with any gynecological interventions.  Clinician discussed concerns about relationship with partner. Clinician explored updates and processed thoughts and feelings. Clinician utilized [place] OARS to process concerns about partner not moving to [place]. Clinician discussed options for having a real conversation about the plan, the relationship.  Clinician encouraged Jordan [name] to start using art as a path toward release of emotions. She identified that art is a coping skill to help her relax.  Clinician provided psychoeducation about ways to draw or paint her emotions.    Plan: Return again in 2-3 weeks.     Diagnosis: Axis I: Moderate mixed bipolar I disorder, PTSD    I discussed the assessment and treatment plan with the patient. The patient was provided an opportunity to ask questions and all were answered. The patient agreed with the plan and demonstrated an understanding of the instructions.   The patient was advised to call back or seek an in-person evaluation if the symptoms worsen or if the condition fails to improve as anticipated.  I provided 45 minutes of non-face-to-face time during this encounter.   [name], LCSW

## 2020-10-04 NOTE — Transfer of Care (Signed)
Immediate Anesthesia Transfer of Care Note  Patient: Jordan [name]  Procedure(s) Performed: DIAGNOSTIC HYSTEROSCOPY (Vagina ) Radio Frequency Ablation with Sonata (Vagina )  Patient Location: PACU  Anesthesia Type:General  Level of Consciousness: awake and patient cooperative  Airway & Oxygen Therapy: Patient Spontanous Breathing and Patient connected to nasal cannula oxygen  Post-op Assessment: Report given to RN and Post -op Vital signs reviewed and stable  Post vital signs: Reviewed and stable  Last Vitals:  Vitals Value Taken Time  BP    Temp    Pulse    Resp    SpO2      Last Pain:  Vitals:   10/04/20 1242  TempSrc: Oral  PainSc: 3       Patients Stated Pain Goal: 5 ([id number])  Complications: No notable events documented.

## 2020-10-04 NOTE — Interval H&P Note (Signed)
History and Physical Interval Note:  10/04/2020 12:14 PM  REALYNN KONJA  has presented today for surgery, with the diagnosis of Fibroid, menorrhagia.  The various methods of treatment have been discussed with the patient and family. After consideration of risks, benefits and other options for treatment, the patient has consented to  Procedure(s): HYSTEROSCOPY (N/A) Radio Frequency Ablation with Sonata (N/A) as a surgical intervention.  The patient's history has been reviewed, patient examined, no change in status, stable for surgery.  I have reviewed the patient's chart and labs.  Questions were answered to the patient's satisfaction.     Griffin Basil

## 2020-10-04 NOTE — Op Note (Signed)
PREOPERATIVE DIAGNOSIS: menorrhagia, uterine fibroids POSTOPERATIVE DIAGNOSIS: The same PROCEDURE: Hysteroscopy, Sonata radiofrequency fibroid ablation SURGEON:  Dr. Lynnda Shields   INDICATIONS: 40 y.o. G0P0000  here for scheduled surgery for the aforementioned diagnoses.   Risks of surgery were discussed with the patient including but not limited to: bleeding which may require transfusion; infection which may require antibiotics; injury to uterus or surrounding organs; intrauterine scarring which may impair future fertility; need for additional procedures including laparotomy or laparoscopy; and other postoperative/anesthesia complications. Written informed consent was obtained.    FINDINGS:  A 9 week size uterus.  Three fundal fibroids.  ANESTHESIA:   General, paracervical block with 30 ml of 0.5% Marcaine INTRAVENOUS FLUIDS:  1300 ml of LR FLUID DEFICITS:  150 ml of NS ESTIMATED BLOOD LOSS:  Less than 20 ml UOP: 30 ml SPECIMENS:none COMPLICATIONS:  None immediate.  PROCEDURE DETAILS:  The patient was then taken to the operating room where general anesthesia was administered and was found to be adequate.  After an adequate timeout was performed, she was placed in the dorsal lithotomy position and examined; then prepped and draped in the sterile manner.    A speculum was then placed in the patient's vagina and a single tooth tenaculum was applied to the anterior lip of the cervix.   A paracervical block using 30 ml of 0.5% Marcaine was administered.  The uterus was sounded to 8 cm and the cervix was dilated manually with metal dilators to 27 fr to accommodate the Sonata probe and apparatus.  The hysteroscope was then inserted under direct visualization using NS as a suspension medium.  The uterine cavity was carefully examined with the findings as noted above.   After further careful visualization of the uterine cavity, the hysteroscope was removed under direct visualization. The sonata device was  placed within the uterine cavity and initial survey was performed with the intrauterine ultrasound probe.  Once the 3  main fibroids were identified, one was selected the ablation size was selected using the graphical Smartguide.  Once it was determined the guide boundaries remained within the serosa, the needle electrodes were introduced into the fibroid.  Visual safety check was performed and then RF was delivered after depressing the foot switch control.  Five ablations were performed during this case.  Fibroid 1 had two ablations of 2.2 x 1.5  for 1 min 30 seconds and 2.0 x 1.3 for one minute.  Fibroid two had one ablation  of 2.1 x 1.4 cm for 1 min 18 seconds.  Fibroid 3 had 2 ablations of 3.3 x 2.4 for 3 minutes and 30 seconds along with 3.2 x 2.3 for 3 minutes and 18 seconds.   The Sonata device was safely removed from the uterus and the tenaculum was removed as well.  All sponge, lap and needle counts were correct x 2. The patient will be discharged to home as per PACU criteria.  Routine postoperative instructions given.  She was prescribed tylenol #3 and  Ibuprofen.  She will follow up in the clinic in 3-4 weeks  for postoperative evaluation.   Lynnda Shields, MD, [place] [facility], [place]

## 2020-10-05 ENCOUNTER — Encounter (HOSPITAL_BASED_OUTPATIENT_CLINIC_OR_DEPARTMENT_OTHER): Payer: Self-pay | Admitting: Obstetrics and Gynecology

## 2020-10-05 NOTE — Anesthesia Postprocedure Evaluation (Signed)
Anesthesia Post Note  Patient: Jordan [name]  Procedure(s) Performed: DIAGNOSTIC HYSTEROSCOPY (Vagina ) Radio Frequency Ablation with Sonata (Vagina )     Patient location during evaluation: PACU Anesthesia Type: General Level of consciousness: awake and alert Pain management: pain level controlled Vital Signs Assessment: post-procedure vital signs reviewed and stable Respiratory status: spontaneous breathing, nonlabored ventilation, respiratory function stable and patient connected to nasal cannula oxygen Cardiovascular status: blood pressure returned to baseline and stable Postop Assessment: no apparent nausea or vomiting Anesthetic complications: no   No notable events documented.                Effie Berkshire

## 2020-10-06 ENCOUNTER — Encounter (HOSPITAL_COMMUNITY): Payer: Self-pay | Admitting: Licensed Clinical Social Worker

## 2020-10-11 ENCOUNTER — Other Ambulatory Visit: Payer: Self-pay

## 2020-10-11 ENCOUNTER — Encounter (HOSPITAL_COMMUNITY): Payer: Self-pay | Admitting: Licensed Clinical Social Worker

## 2020-10-11 ENCOUNTER — Ambulatory Visit (INDEPENDENT_AMBULATORY_CARE_PROVIDER_SITE_OTHER): Payer: Medicare Other | Admitting: Licensed Clinical Social Worker

## 2020-10-11 ENCOUNTER — Ambulatory Visit (HOSPITAL_COMMUNITY): Payer: Medicare Other

## 2020-10-11 DIAGNOSIS — D509 Iron deficiency anemia, unspecified: Secondary | ICD-10-CM | POA: Diagnosis not present

## 2020-10-11 DIAGNOSIS — R7303 Prediabetes: Secondary | ICD-10-CM | POA: Diagnosis not present

## 2020-10-11 DIAGNOSIS — E559 Vitamin D deficiency, unspecified: Secondary | ICD-10-CM | POA: Diagnosis not present

## 2020-10-11 DIAGNOSIS — F4310 Post-traumatic stress disorder, unspecified: Secondary | ICD-10-CM | POA: Diagnosis not present

## 2020-10-11 DIAGNOSIS — F3132 Bipolar disorder, current episode depressed, moderate: Secondary | ICD-10-CM
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
## 2020-10-14 ENCOUNTER — Ambulatory Visit (HOSPITAL_COMMUNITY): Payer: Medicare Other

## 2020-10-17 ENCOUNTER — Other Ambulatory Visit: Payer: Self-pay

## 2020-10-17 ENCOUNTER — Telehealth (HOSPITAL_COMMUNITY): Payer: Medicare Other | Admitting: Psychiatry

## 2020-10-17 DIAGNOSIS — M797 Fibromyalgia: Secondary | ICD-10-CM

## 2020-10-17 NOTE — Telephone Encounter (Signed)
pregabalin (LYRICA) 150 MG capsule, REFILL REQUEST @ [id number]- [place], [place] - [address]

## 2020-10-17 NOTE — Telephone Encounter (Signed)
Deferred Lyrica refill until her appointment tomorrow.

## 2020-10-17 NOTE — Telephone Encounter (Signed)
Next appt scheduled 8/30 with Dr Alfonse Spruce.

## 2020-10-18 ENCOUNTER — Encounter: Payer: Self-pay | Admitting: Student

## 2020-10-18 ENCOUNTER — Other Ambulatory Visit: Payer: Self-pay

## 2020-10-18 ENCOUNTER — Ambulatory Visit (INDEPENDENT_AMBULATORY_CARE_PROVIDER_SITE_OTHER): Payer: Medicare Other | Admitting: Student

## 2020-10-18 DIAGNOSIS — M797 Fibromyalgia: Secondary | ICD-10-CM | POA: Diagnosis not present

## 2020-10-18 DIAGNOSIS — M5126 Other intervertebral disc displacement, lumbar region: Secondary | ICD-10-CM

## 2020-10-18 DIAGNOSIS — N920 Excessive and frequent menstruation with regular cycle: Secondary | ICD-10-CM

## 2020-10-18 DIAGNOSIS — R221 Localized swelling, mass and lump, neck: Secondary | ICD-10-CM

## 2020-10-18 DIAGNOSIS — E7849 Other hyperlipidemia: Secondary | ICD-10-CM | POA: Diagnosis not present

## 2020-10-18 DIAGNOSIS — Z6841 Body Mass Index (BMI) 40.0 and over, adult: Secondary | ICD-10-CM

## 2020-10-18 MED ORDER — PREGABALIN 150 MG PO CAPS: 150.0000 mg | ORAL_CAPSULE | Freq: Two times a day (BID) | ORAL | 0 refills | Status: DC

## 2020-10-18 NOTE — Assessment & Plan Note (Signed)
Patient reports long history of fibromyalgia, controlled with Lyrica.  -Lyrica refill sent

## 2020-10-18 NOTE — Patient Instructions (Signed)
[name],  It was a pleasure seeing you in the clinic today.  I hope everything is going well for you and your niece.  I have sent a refill of Lyrica to your pharmacy.  Please continue exercise and follow-up with your weight loss clinic.  Your blood sugar and cholesterol are doing fine.  No need for medication at this time.  Please return in 6 months,  Take care,  Dr. Alfonse Spruce

## 2020-10-18 NOTE — Assessment & Plan Note (Signed)
10 years ASCVD 0.2%.  -No statin indicated

## 2020-10-18 NOTE — Assessment & Plan Note (Signed)
Continue Lyrica. 
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
## 2020-10-18 NOTE — Assessment & Plan Note (Signed)
Patient following up with her behavioral health provider.  Report adherence to medication of Depakote and hydroxyzine.

## 2020-10-18 NOTE — Assessment & Plan Note (Signed)
Result came back as lipoma.   -We will monitor its growth.  If it gets too large, can consider surgery referral

## 2020-10-18 NOTE — Assessment & Plan Note (Addendum)
Patient to follow-up with a weight loss clinic.  She currently on Victoza 1.8 mg daily.  Patient states that they would like to keep her on that dose for now.  She is not interested in bariatric surgery at this time.  She is also follow-up with a RD.  -Continue Victoza 1.8 mg daily -Last A1c 5.4.  10-year ASCVD 0.2%

## 2020-10-20 ENCOUNTER — Other Ambulatory Visit: Payer: Self-pay

## 2020-10-20 ENCOUNTER — Ambulatory Visit (INDEPENDENT_AMBULATORY_CARE_PROVIDER_SITE_OTHER): Payer: Medicare Other | Admitting: Licensed Clinical Social Worker

## 2020-10-20 DIAGNOSIS — F3132 Bipolar disorder, current episode depressed, moderate: Secondary | ICD-10-CM | POA: Diagnosis not present

## 2020-10-25 ENCOUNTER — Encounter (HOSPITAL_COMMUNITY): Payer: Self-pay | Admitting: Licensed Clinical Social Worker

## 2020-10-25 ENCOUNTER — Other Ambulatory Visit: Payer: Self-pay

## 2020-10-25 ENCOUNTER — Ambulatory Visit (INDEPENDENT_AMBULATORY_CARE_PROVIDER_SITE_OTHER): Payer: Medicare Other | Admitting: Licensed Clinical Social Worker

## 2020-10-25 DIAGNOSIS — F4310 Post-traumatic stress disorder, unspecified: Secondary | ICD-10-CM | POA: Diagnosis not present

## 2020-10-25 DIAGNOSIS — F3132 Bipolar disorder, current episode depressed, moderate: Secondary | ICD-10-CM | POA: Diagnosis not present

## 2020-10-25 NOTE — Progress Notes (Signed)
Virtual Visit via Video Note  I connected with Jordan [name] on 10/25/20 at  8:00 AM EDT by a video enabled telemedicine application and verified that I am speaking with the correct person using two identifiers.  Location: Patient: home Provider: home office   I discussed the limitations of evaluation and management by telemedicine and the availability of in person appointments. The patient expressed understanding and agreed to proceed.  Type of Therapy: Individual Therapy   Treatment Goals addressed: "help me cope with my mood and all of the changes I am going through". Jordan [name] will experience fewer incidents of mood outbursts, anger, and frustration at home 4 out of 7 days, per self report.    Interventions: CBT and Motivational Interviewing   Summary: Jordan [name] is a 40 y.o. female who presents with Moderate mixed bipolar I disorder, PTSD   Suicidal/Homicidal: No -without intent/plan   Therapist Response:   Jordan [name] met with clinician for an individual session. Jordan [name] discussed her psychiatric symptoms, her current life events and her homework. Jordan [name] shared that she is healing well and things seem to be settling down more. Clinician utilized CBT to process her current state, as well as to review reactions to stress over the past few weeks. Clinician explored current coping skills and abilities to manage life as it comes to her. Clinician processed the impact of making friends and having support as a means for coping and getting her mind off things she cannot control. Clinician utilized MI OARS to reflect and summarize feelings about her relationship, and noted that she feels it may be coming to an end.      Plan: Return again in 2-3 weeks.     Diagnosis: Axis I: Moderate mixed bipolar I disorder, PTSD      I discussed the assessment and treatment plan with the patient. The patient was provided an opportunity to ask questions and all were answered. The patient agreed with the  plan and demonstrated an understanding of the instructions.   The patient was advised to call back or seek an in-person evaluation if the symptoms worsen or if the condition fails to improve as anticipated.  I provided 45 minutes of non-face-to-face time during this encounter.   [name], LCSW

## 2020-10-25 NOTE — Progress Notes (Signed)
Internal Medicine Clinic Attending  Case discussed with Dr. Nguyen  At the time of the visit.  We reviewed the resident's history and exam and pertinent patient test results.  I agree with the assessment, diagnosis, and plan of care documented in the resident's note. 

## 2020-10-26 ENCOUNTER — Telehealth (HOSPITAL_COMMUNITY): Payer: Medicare Other | Admitting: Psychiatry

## 2020-10-26 ENCOUNTER — Encounter (HOSPITAL_COMMUNITY): Payer: Self-pay | Admitting: Licensed Clinical Social Worker
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
## 2020-10-31 ENCOUNTER — Other Ambulatory Visit: Payer: Self-pay

## 2020-10-31 ENCOUNTER — Telehealth (INDEPENDENT_AMBULATORY_CARE_PROVIDER_SITE_OTHER): Payer: Medicare Other | Admitting: Psychiatry

## 2020-10-31 ENCOUNTER — Encounter (HOSPITAL_COMMUNITY): Payer: Self-pay | Admitting: Psychiatry

## 2020-10-31 DIAGNOSIS — F4310 Post-traumatic stress disorder, unspecified: Secondary | ICD-10-CM

## 2020-10-31 DIAGNOSIS — F419 Anxiety disorder, unspecified: Secondary | ICD-10-CM | POA: Diagnosis not present

## 2020-10-31 DIAGNOSIS — F3132 Bipolar disorder, current episode depressed, moderate: Secondary | ICD-10-CM

## 2020-10-31 MED ORDER — DIVALPROEX SODIUM ER 500 MG PO TB24: 1000.0000 mg | ORAL_TABLET | Freq: Every day | ORAL | 1 refills | Status: DC

## 2020-10-31 MED ORDER — HYDROXYZINE HCL 10 MG PO TABS: 10.0000 mg | ORAL_TABLET | Freq: Every day | ORAL | 1 refills | Status: DC | PRN
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
## 2020-11-02 ENCOUNTER — Ambulatory Visit (INDEPENDENT_AMBULATORY_CARE_PROVIDER_SITE_OTHER): Payer: Medicare Other | Admitting: Obstetrics and Gynecology

## 2020-11-02 ENCOUNTER — Other Ambulatory Visit: Payer: Self-pay

## 2020-11-02 ENCOUNTER — Encounter: Payer: Self-pay | Admitting: Obstetrics and Gynecology

## 2020-11-02 VITALS — BP 100/68 | HR 65 | Ht 69.0 in | Wt 260.0 lb

## 2020-11-02 DIAGNOSIS — D250 Submucous leiomyoma of uterus: Secondary | ICD-10-CM

## 2020-11-02 DIAGNOSIS — Z4889 Encounter for other specified surgical aftercare: Secondary | ICD-10-CM | POA: Diagnosis not present

## 2020-11-02 NOTE — Progress Notes (Signed)
GYN presents for Post Op FU.  Patient stated that the bleeding has stopped.

## 2020-11-02 NOTE — Progress Notes (Signed)
    Subjective:    Jordan [name] is a 40 y.o. female who presents to the clinic status post [place] fibroid treatment on 10/04/20. The patient is not having any pain.  Eating a regular diet without difficulty. Bowel movements are normal. No other significant postoperative concerns.  The patient did notice some light bleeding for two weeks post procedure.  She missed 4 days of work, but currently she is doing well.  She has had one period which was still heavy, but much shorter than previous.  The patient also noted some passage of fibroid material.  The following portions of the patient's history were reviewed and updated as appropriate: allergies, current medications, past family history, past medical history, past social history, past surgical history, and problem list..  Last pap smear was normal on 02/09/19.  Review of Systems Pertinent items are noted in HPI.   Objective:   BP 100/68   Pulse 65   Ht '5\' 9"'$  (1.753 m)   Wt 260 lb (117.9 kg)   LMP 10/28/2020 (Exact Date)   BMI 38.40 kg/m  Constitutional:  Well-developed, well-nourished female in no acute distress.   Skin: Skin is warm and dry, no rash noted, not diaphoretic,no erythema, no pallor.  Cardiovascular: Normal heart rate noted  Respiratory: Effort and breath sounds normal, no problems with respiration noted  Abdomen: Soft, bowel sounds active, non-tender, no abnormal masses  Incision: Healing well, no drainage, no erythema, no hernia, no seroma, no swelling, no dehiscence, incision well approximated  Pelvic:    Normal external genitalia, normal vagina, cervix WNL   Surgical pathology (none)  Assessment:   Doing well postoperatively.  Operative findings again reviewed. Pathology report discussed.   Plan:   1. Continue any current medications. 2. Activity restrictions: none 3. Anticipated return to work: now. 4. Follow up in 4 months, will repeat gyn [place] in 3 months to reassess  fibroid size and reeval the ovarian  cyst. 5.  Routine preventative health maintenance measures emphasized. Please refer to After Visit Summary for other counseling recommendations.    Lynnda Shields, MD, [place] Attending [place] for [facility], [place]

## 2020-11-07 ENCOUNTER — Ambulatory Visit (HOSPITAL_COMMUNITY): Payer: Medicare Other

## 2020-11-08 ENCOUNTER — Ambulatory Visit (INDEPENDENT_AMBULATORY_CARE_PROVIDER_SITE_OTHER): Payer: Medicare Other | Admitting: Licensed Clinical Social Worker

## 2020-11-08 ENCOUNTER — Other Ambulatory Visit: Payer: Self-pay

## 2020-11-08 ENCOUNTER — Encounter (HOSPITAL_COMMUNITY): Payer: Self-pay | Admitting: Licensed Clinical Social Worker

## 2020-11-08 DIAGNOSIS — F4310 Post-traumatic stress disorder, unspecified: Secondary | ICD-10-CM

## 2020-11-08 DIAGNOSIS — F3132 Bipolar disorder, current episode depressed, moderate: Secondary | ICD-10-CM | POA: Diagnosis not present

## 2020-11-08 NOTE — Progress Notes (Signed)
Virtual Visit via Video Note  I connected with MYRAH STRAWDERMAN on 11/08/20 at  8:00 AM EDT by a video enabled telemedicine application and verified that I am speaking with the correct person using two identifiers.  Location: Patient: home Provider: office   I discussed the limitations of evaluation and management by telemedicine and the availability of in person appointments. The patient expressed understanding and agreed to proceed.  Type of Therapy: Individual Therapy   Treatment Goals addressed: "help me cope with my mood and all of the changes I am going through". Darby will experience fewer incidents of mood outbursts, anger, and frustration at home 4 out of 7 days, per self report.    Interventions: CBT and Motivational Interviewing   Summary: BREAUNA MAZZEO is a 40 y.o. female who presents with Moderate mixed bipolar I disorder, PTSD   Suicidal/Homicidal: No -without intent/plan   Therapist Response:   Reina met with clinician for an individual session. Henya discussed her psychiatric symptoms, her current life events and her homework. Trenyce shared that there has been increasing levels of stress and discomfort in her family and her relationship. Clinician utilized MI OARS to reflect and summarize updates. Clinician identified the enlisting of supports in friends and family to keep [place] safe. Clinician processed thoughts and feelings, which create a significant urge to act out aggressively. Clinician discussed the importance of communicating and allowing herself to be moved by her supports. Clinician explored updates in relationship and validated her thoughts and feelings. Clinician identified the importance of communication with partner in order to ensure that her wishes are being heard and met.      Plan: Return again in 2-3 weeks.     Diagnosis: Axis I: Moderate mixed bipolar I disorder, PTSD   I discussed the assessment and treatment plan with the patient. The patient was  provided an opportunity to ask questions and all were answered. The patient agreed with the plan and demonstrated an understanding of the instructions.   The patient was advised to call back or seek an in-person evaluation if the symptoms worsen or if the condition fails to improve as anticipated.  I provided 50 minutes of non-face-to-face time during this encounter.   [name], LCSW

## 2020-11-15 ENCOUNTER — Ambulatory Visit (INDEPENDENT_AMBULATORY_CARE_PROVIDER_SITE_OTHER): Payer: Medicare Other | Admitting: Licensed Clinical Social Worker

## 2020-11-15 ENCOUNTER — Other Ambulatory Visit: Payer: Self-pay

## 2020-11-15 DIAGNOSIS — F3132 Bipolar disorder, current episode depressed, moderate: Secondary | ICD-10-CM | POA: Diagnosis not present

## 2020-11-17 ENCOUNTER — Encounter (HOSPITAL_COMMUNITY): Payer: Self-pay | Admitting: Licensed Clinical Social Worker
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
## 2020-11-20 ENCOUNTER — Other Ambulatory Visit: Payer: Self-pay | Admitting: Student

## 2020-11-20 DIAGNOSIS — M797 Fibromyalgia: Secondary | ICD-10-CM

## 2020-11-22 ENCOUNTER — Ambulatory Visit (HOSPITAL_COMMUNITY): Payer: Medicare Other | Admitting: Licensed Clinical Social Worker

## 2020-11-22 ENCOUNTER — Other Ambulatory Visit: Payer: Self-pay | Admitting: Student

## 2020-11-22 ENCOUNTER — Other Ambulatory Visit: Payer: Self-pay

## 2020-11-22 DIAGNOSIS — M797 Fibromyalgia: Secondary | ICD-10-CM

## 2020-11-22 NOTE — Telephone Encounter (Signed)
pregabalin (LYRICA) 150 MG capsule, refill request @ [id number] - [place], [place] [address].

## 2020-11-28 ENCOUNTER — Other Ambulatory Visit (HOSPITAL_COMMUNITY): Payer: Self-pay | Admitting: Psychiatry

## 2020-11-28 DIAGNOSIS — F3132 Bipolar disorder, current episode depressed, moderate: Secondary | ICD-10-CM

## 2020-11-28 DIAGNOSIS — F4310 Post-traumatic stress disorder, unspecified: Secondary | ICD-10-CM

## 2020-11-29 ENCOUNTER — Other Ambulatory Visit: Payer: Self-pay

## 2020-11-29 ENCOUNTER — Encounter (HOSPITAL_COMMUNITY): Payer: Self-pay | Admitting: Licensed Clinical Social Worker

## 2020-11-29 ENCOUNTER — Ambulatory Visit (INDEPENDENT_AMBULATORY_CARE_PROVIDER_SITE_OTHER): Payer: Medicare Other | Admitting: Licensed Clinical Social Worker

## 2020-11-29 DIAGNOSIS — F3132 Bipolar disorder, current episode depressed, moderate: Secondary | ICD-10-CM

## 2020-11-29 DIAGNOSIS — F4310 Post-traumatic stress disorder, unspecified: Secondary | ICD-10-CM | POA: Diagnosis not present

## 2020-11-29 NOTE — Progress Notes (Signed)
   THERAPIST PROGRESS NOTE  Session Time: 8:10am-9:00am  Participation Level: Active  Behavioral Response: NeatAlertIrritable  Type of Therapy: Individual Therapy   Treatment Goals addressed: "help me cope with my mood and all of the changes I am going through". Artina will experience fewer incidents of mood outbursts, anger, and frustration at home 4 out of 7 days, per self report.    Interventions: CBT and Motivational Interviewing   Summary: VIRDIA ZIESMER is a 41 y.o. female who presents with Moderate mixed bipolar I disorder, PTSD   Suicidal/Homicidal: No -without intent/plan   Therapist Response:   Kasumi met with clinician for an individual session. Maecy discussed her psychiatric symptoms, her current life events and her homework. Atisha shared that she had a tough conversation with girlfriend and attempted to end the relationship. However, girlfriend denied that request and became instantly more attentive and loving. Clinician utilized MI OARS to reflect and summarize thoughts and feelings. Clinician explored willingness to continue the relationship. Clinician also utilized CBT psychoeducation to assist Aisling in making sense of the change in gf's behavior. Clinician processed coping skills and identified increase in reliance on her friends locally. Clinician discussed the importance of making herself happy and doing what is more in her favor than worrying about how it will impact others immediately. Clinician also identified the need for improved social awareness.   Lonita shared that medication was increased and it is helpful.   Plan: Return again in 2-3 weeks.     Diagnosis: Axis I: Moderate mixed bipolar I disorder, PTSD     [name], LCSW 11/29/2020

## 2020-12-05 ENCOUNTER — Encounter: Payer: Medicare Other | Admitting: Internal Medicine

## 2020-12-06 ENCOUNTER — Ambulatory Visit (INDEPENDENT_AMBULATORY_CARE_PROVIDER_SITE_OTHER): Payer: Medicare Other | Admitting: Licensed Clinical Social Worker

## 2020-12-06 ENCOUNTER — Other Ambulatory Visit: Payer: Self-pay

## 2020-12-06 DIAGNOSIS — F3132 Bipolar disorder, current episode depressed, moderate: Secondary | ICD-10-CM

## 2020-12-08 ENCOUNTER — Encounter (HOSPITAL_COMMUNITY): Payer: Self-pay | Admitting: Licensed Clinical Social Worker

## 2020-12-08 ENCOUNTER — Encounter: Payer: Self-pay | Admitting: Internal Medicine

## 2020-12-08 ENCOUNTER — Ambulatory Visit (INDEPENDENT_AMBULATORY_CARE_PROVIDER_SITE_OTHER): Payer: Medicare Other | Admitting: Internal Medicine

## 2020-12-08 VITALS — BP 121/61 | HR 70 | Temp 98.3°F | Resp 32 | Ht 69.0 in | Wt 264.7 lb

## 2020-12-08 DIAGNOSIS — D170 Benign lipomatous neoplasm of skin and subcutaneous tissue of head, face and neck: Secondary | ICD-10-CM

## 2020-12-08 DIAGNOSIS — Z23 Encounter for immunization: Secondary | ICD-10-CM | POA: Diagnosis not present

## 2020-12-08 DIAGNOSIS — Z0000 Encounter for general adult medical examination without abnormal findings: Secondary | ICD-10-CM

## 2020-12-08 DIAGNOSIS — R221 Localized swelling, mass and lump, neck: Secondary | ICD-10-CM | POA: Diagnosis not present

## 2020-12-08 NOTE — Progress Notes (Signed)
   THERAPIST PROGRESS NOTE  Session Time: 8:00am-8:50am  Participation Level: Active  Behavioral Response: Well GroomedAlertEuthymic  Type of Therapy: Individual Therapy  Treatment Goals addressed: "help me cope with my mood and all of the changes I am going through". Jordan [name] will experience fewer incidents of mood outbursts, anger, and frustration at home 4 out of 7 days, per self report.    Interventions: CBT and Motivational Interviewing   Summary: Jordan [name] is a 40 y.o. female who presents with Moderate mixed bipolar I disorder, PTSD   Suicidal/Homicidal: No -without intent/plan   Therapist Response:   Jordan [name] met with clinician for an individual session. Jordan [name] discussed her psychiatric symptoms, her current life events and her homework. Jordan [name] shared that she continues to struggle with ending her relationship with gf. Clinician utilized CBT to identify thoughts and feelings about the difficult conversation and the updates in gf's behavior following the breakup. Jordan [name] identified concern that she continues to find women who are "crazy". Clinician discussed Jordan [name]'s coping skills used to deal with these challenging personalities. Clinician also discussed new updates with new friendship. Clinician discussed different approaches and ways to establish her expectations and boundaries. Clinician discussed updates with medications and noted increased sense of control in life now.    Plan: Return again in 2-3 weeks.     Diagnosis: Axis I: Moderate mixed bipolar I disorder, PTSD      [name], LCSW 12/08/2020

## 2020-12-08 NOTE — Assessment & Plan Note (Signed)
Patient initially presented for concerns of growth on her neck for two years duration in June 2022. This was evaluated with ultrasound and results were most consistent with lipoma, approximately 2.9cm in size. Patient was advised to continue to monitor. She reports that since her last visit in August 2022, the lipoma has grown in size and is now painful. She denies any fevers/chills, headaches, lightheadedness/dizziness, or weakness.  On examination, firm mobile mass with minimal tenderness without overlying erythema approximately 3.5cm in size noted. Active and passive ROM of neck intact without reproducible pain.  Plan: Referral to surgery for excision
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
## 2020-12-08 NOTE — Assessment & Plan Note (Signed)
Flu vaccine administered at this visit

## 2020-12-08 NOTE — Patient Instructions (Signed)
Ms Jordan [name],  It was a pleasure seeing you in clinic. Today we discussed:   Lipoma: Referral sent to surgery. Please schedule an appointment at your earliest convenience.  If you have any questions or concerns, please call our clinic at [phone] between 9am-5pm and after hours call [phone] and ask for the internal medicine resident on call. If you feel you are having a medical emergency please call 911.   Thank you, we look forward to helping you remain healthy!

## 2020-12-12 NOTE — Progress Notes (Signed)
Internal Medicine Clinic Attending ° °Case discussed with Dr. Aslam  At the time of the visit.  We reviewed the resident’s history and exam and pertinent patient test results.  I agree with the assessment, diagnosis, and plan of care documented in the resident’s note.  °

## 2020-12-13 ENCOUNTER — Ambulatory Visit (INDEPENDENT_AMBULATORY_CARE_PROVIDER_SITE_OTHER): Payer: Medicare Other | Admitting: Licensed Clinical Social Worker

## 2020-12-13 ENCOUNTER — Other Ambulatory Visit: Payer: Self-pay

## 2020-12-13 DIAGNOSIS — F3132 Bipolar disorder, current episode depressed, moderate: Secondary | ICD-10-CM | POA: Diagnosis not present

## 2020-12-14 ENCOUNTER — Encounter (HOSPITAL_COMMUNITY): Payer: Self-pay | Admitting: Licensed Clinical Social Worker

## 2020-12-14 NOTE — Progress Notes (Signed)
   THERAPIST PROGRESS NOTE  Session Time: 8:15am-9:00am  Participation Level: Active  Behavioral Response: Neat and Well GroomedAlertEuthymic  Type of Therapy: Individual Therapy Treatment Goals addressed: "help me cope with my mood and all of the changes I am going through". Keliyah will experience fewer incidents of mood outbursts, anger, and frustration at home 4 out of 7 days, per self report.    Interventions: CBT and Motivational Interviewing   Summary: CLEOTA PELLERITO is a 40 y.o. female who presents with Moderate mixed bipolar I disorder, PTSD   Suicidal/Homicidal: No -without intent/plan   Therapist Response:   Jakeira met with clinician for an individual session. Psalm discussed her psychiatric symptoms, her current life events and her homework. Lexus shared that she had another hard conversation about transitioning her relationship with girlfriend to friend. Clinician utilized CBT to process thoughts, feelings, and behavior. Clinician explored reasons why this was a challenge in coping. Clinician also processed helpful ways to change thoughts about the relationship as a whole. Clinician explored newer relationship/friendship. Clinician identified increase in community involvement, new friends, and new skills, as the new friend works in the theater and has brought [name] into that world. Clinician noted [place]'s sense of belonging, excitement, and willingness to be herself with this group. 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 Clinician also utilized MI OARS to reflect and summarize feelings of having someone truly support and encourage her to do more and be more.    Plan: Return again in 1-2 weeks.     Diagnosis: Axis I: Moderate mixed bipolar I disorder, PTSD      [name], LCSW 12/14/2020

## 2020-12-15 DIAGNOSIS — R7303 Prediabetes: Secondary | ICD-10-CM | POA: Diagnosis not present

## 2020-12-15 DIAGNOSIS — E559 Vitamin D deficiency, unspecified: Secondary | ICD-10-CM | POA: Diagnosis not present

## 2020-12-15 DIAGNOSIS — D509 Iron deficiency anemia, unspecified: Secondary | ICD-10-CM | POA: Diagnosis not present

## 2020-12-20 ENCOUNTER — Other Ambulatory Visit: Payer: Self-pay

## 2020-12-20 ENCOUNTER — Ambulatory Visit (INDEPENDENT_AMBULATORY_CARE_PROVIDER_SITE_OTHER): Payer: Medicare Other | Admitting: Licensed Clinical Social Worker

## 2020-12-20 DIAGNOSIS — F3132 Bipolar disorder, current episode depressed, moderate: Secondary | ICD-10-CM | POA: Diagnosis not present

## 2020-12-21 ENCOUNTER — Other Ambulatory Visit: Payer: Self-pay

## 2020-12-21 DIAGNOSIS — M797 Fibromyalgia: Secondary | ICD-10-CM

## 2020-12-21 MED ORDER — PREGABALIN 150 MG PO CAPS: 150.0000 mg | ORAL_CAPSULE | Freq: Two times a day (BID) | ORAL | 2 refills | Status: DC

## 2020-12-22 ENCOUNTER — Encounter (HOSPITAL_COMMUNITY): Payer: Self-pay | Admitting: Licensed Clinical Social Worker

## 2020-12-22 NOTE — Progress Notes (Signed)
Virtual Visit via Telephone Note  I connected with Jordan [name] on 12/22/20 at 10:00 AM EDT by telephone and verified that I am speaking with the correct person using two identifiers.  Location: Patient: home Provider: office   I discussed the limitations, risks, security and privacy concerns of performing an evaluation and management service by telephone and the availability of in person appointments. I also discussed with the patient that there may be a patient responsible charge related to this service. The patient expressed understanding and agreed to proceed.  Treatment Goals addressed: "help me cope with my mood and all of the changes I am going through". Jordan [name] will experience fewer incidents of mood outbursts, anger, and frustration at home 4 out of 7 days, per self report.    Interventions: CBT and Motivational Interviewing   Summary: Jordan [name] is a 40 y.o. female who presents with Moderate mixed bipolar I disorder, PTSD   Suicidal/Homicidal: No -without intent/plan   Therapist Response:   Myndi met with clinician for an individual session. Jordan [name] discussed her psychiatric symptoms, her current life events and her homework. Jordan [name] shared that she is doing much better this week. She reports that she has not heard anything from her now ex-girlfriend in [place], which has resulted in much more ease and relaxation emotionally. Clinician processed the sense of a weight being lifted off her chest. Clinician explored the new relationship with friend here and noted the impact being made on her life. Jordan [name] shared that she feels encouraged to become a better version of herself. 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 She also noted that her children are being positively impacted and "lifted up" by the new community as well. Clinician reflected the easy smile on Jordan [name]'s face, as she reported feeling like she belongs, doesn't have to be  different than who she really is, and that she has people who are like her and who appreciate her.     Plan: Return again in 1-2 weeks.     Diagnosis: Axis I: Moderate mixed bipolar I disorder, PTSD     I discussed the assessment and treatment plan with the patient. The patient was provided an opportunity to ask questions and all were answered. The patient agreed with the plan and demonstrated an understanding of the instructions.   The patient was advised to call back or seek an in-person evaluation if the symptoms worsen or if the condition fails to improve as anticipated.  I provided 50 minutes of non-face-to-face time during this encounter.   [name], LCSW

## 2020-12-23 DIAGNOSIS — D170 Benign lipomatous neoplasm of skin and subcutaneous tissue of head, face and neck: Secondary | ICD-10-CM | POA: Diagnosis not present

## 2020-12-28 ENCOUNTER — Ambulatory Visit (INDEPENDENT_AMBULATORY_CARE_PROVIDER_SITE_OTHER): Payer: Medicare Other | Admitting: Licensed Clinical Social Worker

## 2020-12-28 ENCOUNTER — Other Ambulatory Visit: Payer: Self-pay

## 2020-12-28 ENCOUNTER — Encounter (HOSPITAL_COMMUNITY): Payer: Self-pay | Admitting: Licensed Clinical Social Worker

## 2020-12-28 DIAGNOSIS — F3132 Bipolar disorder, current episode depressed, moderate: Secondary | ICD-10-CM

## 2020-12-28 NOTE — Progress Notes (Signed)
Virtual Visit via Video Note  I connected with Jordan [name] on 12/28/20 at  8:00 AM EST by a video enabled telemedicine application and verified that I am speaking with the correct person using two identifiers.  Location: Patient: home Provider: home office   I discussed the limitations of evaluation and management by telemedicine and the availability of in person appointments. The patient expressed understanding and agreed to proceed.  Treatment Goals addressed: "help me cope with my mood and all of the changes I am going through". Jordan [name] will experience fewer incidents of mood outbursts, anger, and frustration at home 4 out of 7 days, per self report.    Interventions: CBT and Motivational Interviewing   Summary: Jordan [name] is a 40 y.o. female who presents with Moderate mixed bipolar I disorder, PTSD   Suicidal/Homicidal: No -without intent/plan   Therapist Response:   Jordan [name] met with clinician for an individual session. Jordan [name] discussed her psychiatric symptoms, her current life events and her homework. Jordan [name] shared that things are going well. 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 Clinician utilized CBT to identify changes in attitude, changes in courage, and willingness to step out of her comfort zone. 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 Clinician utilized CBT to process recent problem with her best friend, noting this same issue. Clinician reflected the challenge of having unbalanced relationships. Clinician encouraged Jordan [name] to maintain the focus on her own growth and her new support system.    Plan: Return again in 1-2 weeks.     Diagnosis: Axis I: Moderate mixed bipolar I disorder, PTSD   I discussed the assessment and treatment plan with the patient.  The patient was provided an opportunity to ask questions and all were answered. The patient agreed with the plan and demonstrated an understanding of the instructions.   The patient was advised to call back or seek an in-person evaluation if the symptoms worsen or if the condition fails to improve as anticipated.  I provided 45 minutes of non-face-to-face time during this encounter.   [name], LCSW

## 2021-01-03 ENCOUNTER — Ambulatory Visit (INDEPENDENT_AMBULATORY_CARE_PROVIDER_SITE_OTHER): Payer: Medicare Other | Admitting: Licensed Clinical Social Worker

## 2021-01-03 ENCOUNTER — Encounter (HOSPITAL_COMMUNITY): Payer: Self-pay | Admitting: Licensed Clinical Social Worker

## 2021-01-03 ENCOUNTER — Other Ambulatory Visit: Payer: Self-pay | Admitting: Internal Medicine

## 2021-01-03 ENCOUNTER — Other Ambulatory Visit: Payer: Self-pay

## 2021-01-03 DIAGNOSIS — Z1231 Encounter for screening mammogram for malignant neoplasm of breast: Secondary | ICD-10-CM

## 2021-01-03 DIAGNOSIS — F3132 Bipolar disorder, current episode depressed, moderate: Secondary | ICD-10-CM | POA: Diagnosis not present

## 2021-01-03 NOTE — Progress Notes (Signed)
   THERAPIST PROGRESS NOTE  Session Time: 8:00am-8:50am  Participation Level: Active  Behavioral Response: Well GroomedAlertEuthymic  Type of Therapy: Individual Therapy Treatment Goals addressed: "help me cope with my mood and all of the changes I am going through". Julisa will experience fewer incidents of mood outbursts, anger, and frustration at home 4 out of 7 days, per self report.    Interventions: CBT and Motivational Interviewing   Summary: KEILANY BURNETTE is a 40 y.o. female who presents with Moderate mixed bipolar I disorder, PTSD   Suicidal/Homicidal: No -without intent/plan   Therapist Response:   [name] met with clinician for an individual session. Kc discussed her psychiatric symptoms, her current life events and her homework. 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 Clinician discussed relationships within the new friend group, as well as relationship with friend Levada Dy. Clinician processed experience of being a partner with someone who she is unable to really be a partner with. Clinician identified the importance of maintaining some level of boundaries with her new partner, as they are both in flux. Clinician explored updates with her nieces and identified a lot of great opportunities in the future. Clinician processed thoughts and feelings about niece spending more time with her mother and sister. Clinician identified the importance of maintaining her own needs and making sure she keeps tabs on her emotional state.    Plan: Return again in 1-2 weeks.     Diagnosis: Axis I: Moderate mixed bipolar I disorder, PTSD     [name], LCSW 01/03/2021

## 2021-01-10 ENCOUNTER — Other Ambulatory Visit: Payer: Self-pay

## 2021-01-10 ENCOUNTER — Ambulatory Visit (INDEPENDENT_AMBULATORY_CARE_PROVIDER_SITE_OTHER): Payer: Medicare Other | Admitting: Licensed Clinical Social Worker

## 2021-01-10 DIAGNOSIS — F3132 Bipolar disorder, current episode depressed, moderate: Secondary | ICD-10-CM

## 2021-01-11 ENCOUNTER — Encounter (HOSPITAL_COMMUNITY): Payer: Self-pay | Admitting: Licensed Clinical Social Worker
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
## 2021-01-18 ENCOUNTER — Other Ambulatory Visit (HOSPITAL_COMMUNITY): Payer: Self-pay | Admitting: *Deleted

## 2021-01-18 ENCOUNTER — Telehealth (HOSPITAL_BASED_OUTPATIENT_CLINIC_OR_DEPARTMENT_OTHER): Payer: Medicare Other | Admitting: Psychiatry

## 2021-01-18 ENCOUNTER — Other Ambulatory Visit: Payer: Self-pay

## 2021-01-18 ENCOUNTER — Telehealth (HOSPITAL_COMMUNITY): Payer: Self-pay | Admitting: *Deleted

## 2021-01-18 ENCOUNTER — Encounter (HOSPITAL_COMMUNITY): Payer: Self-pay | Admitting: Psychiatry

## 2021-01-18 DIAGNOSIS — F4310 Post-traumatic stress disorder, unspecified: Secondary | ICD-10-CM

## 2021-01-18 DIAGNOSIS — F3132 Bipolar disorder, current episode depressed, moderate: Secondary | ICD-10-CM | POA: Diagnosis not present

## 2021-01-18 DIAGNOSIS — Z79899 Other long term (current) drug therapy: Secondary | ICD-10-CM

## 2021-01-18 DIAGNOSIS — F419 Anxiety disorder, unspecified: Secondary | ICD-10-CM | POA: Diagnosis not present

## 2021-01-18 MED ORDER — DIVALPROEX SODIUM ER 250 MG PO TB24: 750.0000 mg | ORAL_TABLET | Freq: Every day | ORAL | 2 refills | Status: DC

## 2021-01-18 MED ORDER — HYDROXYZINE HCL 10 MG PO TABS: 10.0000 mg | ORAL_TABLET | Freq: Every day | ORAL | 2 refills | Status: DC | PRN

## 2021-01-18 NOTE — Telephone Encounter (Signed)
Writer left VM for pt regarding Valproic Acid level to be drawn in 2 weeks (approximately 02/01/21) at [facility]. Pt advised to hold medication prior to having blood work done, then resume normal dose. Pt encouraged to call with any questions or concerns
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
## 2021-01-19 ENCOUNTER — Ambulatory Visit (HOSPITAL_COMMUNITY): Payer: Medicare Other | Admitting: Licensed Clinical Social Worker

## 2021-01-19 ENCOUNTER — Other Ambulatory Visit: Payer: Self-pay

## 2021-01-20 ENCOUNTER — Other Ambulatory Visit (HOSPITAL_COMMUNITY): Payer: Self-pay | Admitting: Psychiatry

## 2021-01-20 DIAGNOSIS — F4310 Post-traumatic stress disorder, unspecified: Secondary | ICD-10-CM

## 2021-01-20 DIAGNOSIS — F3132 Bipolar disorder, current episode depressed, moderate: Secondary | ICD-10-CM

## 2021-01-23 ENCOUNTER — Encounter: Payer: Self-pay | Admitting: Internal Medicine

## 2021-01-23 ENCOUNTER — Other Ambulatory Visit: Payer: Self-pay

## 2021-01-23 ENCOUNTER — Ambulatory Visit (INDEPENDENT_AMBULATORY_CARE_PROVIDER_SITE_OTHER): Payer: Medicare Other | Admitting: Internal Medicine

## 2021-01-23 DIAGNOSIS — S6980XA Other specified injuries of unspecified wrist, hand and finger(s), initial encounter: Secondary | ICD-10-CM

## 2021-01-23 DIAGNOSIS — S6010XA Contusion of unspecified finger with damage to nail, initial encounter: Secondary | ICD-10-CM

## 2021-01-23 HISTORY — DX: Contusion of unspecified finger with damage to nail, initial encounter: S60.10XA
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
## 2021-01-23 NOTE — Progress Notes (Signed)
Internal Medicine Clinic Attending  I saw and evaluated the patient.  I personally confirmed the key portions of the history and exam documented by Dr. Patel and I reviewed pertinent patient test results.  The assessment, diagnosis, and plan were formulated together and I agree with the documentation in the resident's note.  

## 2021-01-23 NOTE — Assessment & Plan Note (Addendum)
Presenting with right 4th digit traumatic subungual hemorrhage 2/2 slamming door in door x 1 day ago. She has used NSAIDs and with minimal relief in pain. She has not applied ice, counseled to start apply ice. On exam (see image in note), there is mild associated swelling and limited ROM of left 4th DIP 2/2 pain; other joints wnl. Pt inquiring about drainage, not indicated given diffuse nature without focal fluid collection. Low suspicion for bone fracture and no change in management regardless of XR findings; XR offered, pt declined. Anticipate improvement with conservative management: NSAIDs, ice, rest, compression and limiting movement. Pt instructed to call clinic if sxs worsen.

## 2021-01-23 NOTE — Patient Instructions (Signed)
Thank you, [name] for allowing [place] to provide your care today!  Today we discussed: Finger nail pain  Continue to use naproxen. Start ICING the joint and avoiding pressure.  Follow up in: 2 mo    Should you have any questions or concerns please call the internal medicine clinic at [phone].     [name], MD  Internal Medicine Resident, PGY-1 [name]

## 2021-01-24 ENCOUNTER — Ambulatory Visit (HOSPITAL_COMMUNITY): Payer: Medicare Other | Admitting: Licensed Clinical Social Worker

## 2021-01-26 ENCOUNTER — Other Ambulatory Visit: Payer: Self-pay

## 2021-01-26 ENCOUNTER — Encounter: Payer: Self-pay | Admitting: Podiatry

## 2021-01-26 ENCOUNTER — Ambulatory Visit (INDEPENDENT_AMBULATORY_CARE_PROVIDER_SITE_OTHER): Payer: Medicare Other | Admitting: Podiatry

## 2021-01-26 DIAGNOSIS — M722 Plantar fascial fibromatosis: Secondary | ICD-10-CM

## 2021-01-26 MED ORDER — TRIAMCINOLONE ACETONIDE 10 MG/ML IJ SUSP
20.0000 mg | Freq: Once | INTRAMUSCULAR | Status: AC
  Administered 2021-01-26: 20 mg

## 2021-01-27 NOTE — Progress Notes (Signed)
Subjective:   Patient ID: Jordan [name], female   DOB: 40 y.o.   MRN: [id number]   HPI Patient states she was doing well but her heel pain started increasing over the last month   ROS      Objective:  Physical Exam  Neurovascular status intact with approximate 9 months relief from having had chronic heel pain bilateral with medication     Assessment:  Chronic Planter fasciitis bilateral     Plan:  Sterile prep we injected the plantar fascial bilateral 3 mg Kenalog 5 mg Xylocaine and reappoint as needed

## 2021-01-28 ENCOUNTER — Ambulatory Visit
Admission: EM | Admit: 2021-01-28 | Discharge: 2021-01-28 | Disposition: A | Discharge status: Home / Self Care | Payer: Medicare Other | Attending: Physician Assistant | Admitting: Physician Assistant

## 2021-01-28 ENCOUNTER — Other Ambulatory Visit: Payer: Self-pay

## 2021-01-28 ENCOUNTER — Encounter: Payer: Self-pay | Admitting: Emergency Medicine

## 2021-01-28 ENCOUNTER — Ambulatory Visit (INDEPENDENT_AMBULATORY_CARE_PROVIDER_SITE_OTHER): Payer: Medicare Other

## 2021-01-28 DIAGNOSIS — M79644 Pain in right finger(s): Secondary | ICD-10-CM

## 2021-01-28 DIAGNOSIS — S62664A Nondisplaced fracture of distal phalanx of right ring finger, initial encounter for closed fracture: Secondary | ICD-10-CM | POA: Diagnosis not present

## 2021-01-28 NOTE — ED Triage Notes (Signed)
Patient states she closed her finger in the door on Sunday.  Seen by her PCP on Monday, finger was swollen and painful.  Advised to apply ice and take Tylenol.  Now the right 4th finger is very painful, cannot straighten out finger and the nailbed is black/grey in color.
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
## 2021-01-30 ENCOUNTER — Other Ambulatory Visit: Payer: Self-pay | Admitting: Orthopedic Surgery

## 2021-01-30 ENCOUNTER — Telehealth (HOSPITAL_COMMUNITY): Payer: Medicare Other | Admitting: Psychiatry

## 2021-01-30 DIAGNOSIS — M545 Low back pain, unspecified: Secondary | ICD-10-CM

## 2021-01-31 ENCOUNTER — Ambulatory Visit (INDEPENDENT_AMBULATORY_CARE_PROVIDER_SITE_OTHER): Payer: Medicare Other | Admitting: Licensed Clinical Social Worker

## 2021-01-31 ENCOUNTER — Other Ambulatory Visit: Payer: Self-pay

## 2021-01-31 DIAGNOSIS — F3132 Bipolar disorder, current episode depressed, moderate: Secondary | ICD-10-CM

## 2021-02-01 ENCOUNTER — Ambulatory Visit
Admission: RE | Admit: 2021-02-01 | Discharge: 2021-02-01 | Disposition: A | Discharge status: Home / Self Care | Payer: Medicare Other | Source: Ambulatory Visit | Attending: Obstetrics and Gynecology | Admitting: Obstetrics and Gynecology

## 2021-02-01 ENCOUNTER — Other Ambulatory Visit: Payer: Self-pay

## 2021-02-01 DIAGNOSIS — Z4889 Encounter for other specified surgical aftercare: Secondary | ICD-10-CM | POA: Diagnosis present

## 2021-02-01 DIAGNOSIS — D250 Submucous leiomyoma of uterus: Secondary | ICD-10-CM | POA: Diagnosis not present

## 2021-02-02 ENCOUNTER — Encounter (HOSPITAL_COMMUNITY): Payer: Self-pay | Admitting: Licensed Clinical Social Worker

## 2021-02-02 NOTE — Progress Notes (Signed)
° °  THERAPIST PROGRESS NOTE  Session Time: 8:00am-9:00am  Participation Level: Active  Behavioral Response: Well GroomedAlertEuthymic  Type of Therapy: Individual Therapy  Type of Therapy: Individual Therapy Treatment Goals addressed: "help me cope with my mood and all of the changes I am going through". [place] will experience fewer incidents of mood outbursts, anger, and frustration at home 4 out of 7 days, per self report.    Interventions: CBT and Motivational Interviewing   Summary: Jordan [name] is a 40 y.o. female who presents with Moderate mixed bipolar I disorder, PTSD   Suicidal/Homicidal: No -without intent/plan   Therapist Response:   Monike met with Jordan [name] for an individual session. Jordan [name] discussed her psychiatric symptoms, her current life events and her homework. Jordan [name] shared that she has been more and more frustrated with her nieces, who have not been doing what she expects of them. Jordan [name] utilized CBT to process thoughts, feelings, and behaviors. Jordan [name] reflected the sense of being frustrated and hurt by her nieces, as well as feeling disappointed that the nurturing she has done has not made the changes in their lives she hoped. Jordan [name] validated the feelings and also reminded Jordan [name] that she has done what she can do and the rest is up to them. Jordan [name] processed Jordan [name]'s challenges in relationships and her current feelings about dating. Jordan [name] shared updates in friendship with ex-girlfriend and processed feelings. Jordan [name] shared that her mood has been a little more stable when she is getting out with friends. However, she shared that her frustration with kids has been making her feel close to outburst. Jordan [name] processed coping skills and identified the importance of not engaging when she knows it will end badly.     Plan: Return again in 1-2 weeks.     Diagnosis: Axis I: Moderate mixed bipolar I disorder, PTSD      [name],  LCSW 02/02/2021

## 2021-02-03 ENCOUNTER — Other Ambulatory Visit: Payer: Medicare Other

## 2021-02-06 ENCOUNTER — Ambulatory Visit
Admission: RE | Admit: 2021-02-06 | Discharge: 2021-02-06 | Disposition: A | Discharge status: Home / Self Care | Payer: Medicare Other | Source: Ambulatory Visit | Attending: Orthopedic Surgery | Admitting: Orthopedic Surgery

## 2021-02-06 ENCOUNTER — Other Ambulatory Visit: Payer: Self-pay | Admitting: Orthopedic Surgery

## 2021-02-06 DIAGNOSIS — M545 Low back pain, unspecified: Secondary | ICD-10-CM

## 2021-02-06 MED ORDER — IOPAMIDOL (ISOVUE-M 300) INJECTION 61%
1.0000 mL | Freq: Once | INTRAMUSCULAR | Status: AC | PRN
  Administered 2021-02-06: 10:00:00 1 mL via INTRA_ARTICULAR

## 2021-02-06 MED ORDER — METHYLPREDNISOLONE ACETATE 40 MG/ML INJ SUSP (RADIOLOG
80.0000 mg | Freq: Once | INTRAMUSCULAR | Status: AC
  Administered 2021-02-06: 10:00:00 80 mg via INTRA_ARTICULAR

## 2021-02-06 NOTE — Discharge Instructions (Signed)

## 2021-02-09 ENCOUNTER — Ambulatory Visit
Admission: RE | Admit: 2021-02-09 | Discharge: 2021-02-09 | Disposition: A | Discharge status: Home / Self Care | Payer: Medicare Other | Source: Ambulatory Visit

## 2021-02-09 DIAGNOSIS — Z1231 Encounter for screening mammogram for malignant neoplasm of breast: Secondary | ICD-10-CM

## 2021-02-14 ENCOUNTER — Other Ambulatory Visit: Payer: Self-pay | Admitting: Internal Medicine

## 2021-02-14 DIAGNOSIS — R928 Other abnormal and inconclusive findings on diagnostic imaging of breast: Secondary | ICD-10-CM

## 2021-02-16 ENCOUNTER — Ambulatory Visit (INDEPENDENT_AMBULATORY_CARE_PROVIDER_SITE_OTHER): Payer: Medicare Other | Admitting: Internal Medicine

## 2021-02-16 DIAGNOSIS — S62664D Nondisplaced fracture of distal phalanx of right ring finger, subsequent encounter for fracture with routine healing: Secondary | ICD-10-CM | POA: Diagnosis not present

## 2021-02-16 DIAGNOSIS — S62664A Nondisplaced fracture of distal phalanx of right ring finger, initial encounter for closed fracture: Secondary | ICD-10-CM | POA: Insufficient documentation

## 2021-02-16 NOTE — Patient Instructions (Signed)
You may slowly return to normal use of your right hand. You should notice it continue to slowly improve over the next month. If it continues to make drawing/painting difficult after that time, please follow up with Dr. Philipp Ovens

## 2021-02-16 NOTE — Assessment & Plan Note (Addendum)
She presents to the office today for reevaluation of the nondisplaced fracture that was obtained around 4 weeks ago.  She reports improvement in pain slowly.  She notes that she has an upcoming arch show that she would like to prepare for however she is concerned whether or not she can remove the splint that she has been using. I explained to her that she can remove the splint and can slowly return to normal use of it.  Since it appears to be healing normally, I do not think she needs a referral to the hand surgeon.  After discussion regarding expectations of normal healing, she felt reassured. If she continues to have pain or issues using her hand after normal expected healing time, we can consider referring her to OT for further therapy.

## 2021-02-16 NOTE — Progress Notes (Signed)
° °  Office Visit   Patient ID: Jordan [name], female    DOB: 1980/12/06, 40 y.o.   MRN: [id number]   PCP: Velna Ochs, MD   Subjective:  CC: Follow-up (ED follow up -finger fx-requests referral to [facility]/)   Jordan [name] is a 40 y.o. year old female who presents for the above medical conditions. Please refer to problem based charting for assessment and plan.  Objective:   BP 98/68 (BP Location: Left Arm, Patient Position: Sitting, Cuff Size: Normal)    Pulse 66    Temp 98.4 F (36.9 C) (Oral)    Ht 5\' 9"  (1.753 m)    Wt 268 lb (121.6 kg)    SpO2 100%    BMI 39.58 kg/m  BP Readings from Last 3 Encounters:  02/16/21 98/68  02/06/21 104/68  01/28/21 (!) 89/59   General: Well-appearing female in no acute distress Right hand: No obvious deformity of the right fourth digit.  Able to be palpated without discomfort.  She is able to demonstrate opening and closing of her fist without difficulty and has full range of motion of the fourth digit.  Assessment & Plan:   Problem List Items Addressed This Visit       Musculoskeletal and Integument   Nondisplaced fracture of distal phalanx of right ring finger    She presents to the office today for reevaluation of the nondisplaced fracture that was obtained around 4 weeks ago.  She reports improvement in pain slowly.  She notes that she has an upcoming arch show that she would like to prepare for however she is concerned whether or not she can remove the splint that she has been using. I explained to her that she can remove the splint and can slowly return to normal use of it.  Since it appears to be healing normally, I do not think she needs a referral to the hand surgeon.  After discussion regarding expectations of normal healing, she felt reassured. If she continues to have pain or issues using her hand after normal expected healing time, we can consider referring her to OT for further therapy.      Pt discussed with Dr.  Forde Radon, MD Internal Medicine Resident PGY-3 [name] 02/16/2021 6:22 PM

## 2021-02-21 ENCOUNTER — Encounter (HOSPITAL_BASED_OUTPATIENT_CLINIC_OR_DEPARTMENT_OTHER): Payer: Self-pay | Admitting: General Surgery

## 2021-02-21 NOTE — Progress Notes (Signed)
Internal Medicine Clinic Attending  Case discussed with Dr. Christian  At the time of the visit.  We reviewed the resident's history and exam and pertinent patient test results.  I agree with the assessment, diagnosis, and plan of care documented in the resident's note.  

## 2021-02-28 ENCOUNTER — Ambulatory Visit: Payer: Self-pay | Admitting: General Surgery

## 2021-03-01 ENCOUNTER — Ambulatory Visit (INDEPENDENT_AMBULATORY_CARE_PROVIDER_SITE_OTHER): Payer: 59 | Admitting: Licensed Clinical Social Worker

## 2021-03-01 ENCOUNTER — Other Ambulatory Visit: Payer: Self-pay

## 2021-03-01 DIAGNOSIS — F3132 Bipolar disorder, current episode depressed, moderate: Secondary | ICD-10-CM | POA: Diagnosis not present

## 2021-03-02 ENCOUNTER — Encounter (HOSPITAL_BASED_OUTPATIENT_CLINIC_OR_DEPARTMENT_OTHER): Payer: Self-pay | Admitting: Certified Registered"

## 2021-03-02 ENCOUNTER — Encounter (HOSPITAL_BASED_OUTPATIENT_CLINIC_OR_DEPARTMENT_OTHER): Payer: Self-pay | Admitting: General Surgery

## 2021-03-02 ENCOUNTER — Encounter (HOSPITAL_BASED_OUTPATIENT_CLINIC_OR_DEPARTMENT_OTHER)
Admission: RE | Disposition: A | Discharge status: Home / Self Care | Payer: Self-pay | Source: Home / Self Care | Attending: General Surgery

## 2021-03-02 ENCOUNTER — Ambulatory Visit (HOSPITAL_BASED_OUTPATIENT_CLINIC_OR_DEPARTMENT_OTHER)
Admission: RE | Admit: 2021-03-02 | Discharge: 2021-03-02 | Disposition: A | Discharge status: Home / Self Care | Payer: Medicare Other | Attending: General Surgery | Admitting: General Surgery

## 2021-03-02 ENCOUNTER — Encounter (HOSPITAL_COMMUNITY): Payer: Self-pay | Admitting: Licensed Clinical Social Worker

## 2021-03-02 DIAGNOSIS — D170 Benign lipomatous neoplasm of skin and subcutaneous tissue of head, face and neck: Secondary | ICD-10-CM | POA: Insufficient documentation

## 2021-03-02 DIAGNOSIS — Z6841 Body Mass Index (BMI) 40.0 and over, adult: Secondary | ICD-10-CM

## 2021-03-02 DIAGNOSIS — D171 Benign lipomatous neoplasm of skin and subcutaneous tissue of trunk: Secondary | ICD-10-CM | POA: Diagnosis not present

## 2021-03-02 HISTORY — PX: LIPOMA EXCISION: SHX5283

## 2021-03-02 SURGERY — MINOR EXCISION LIPOMA
Anesthesia: LOCAL | Laterality: Left

## 2021-03-02 MED ORDER — ACETAMINOPHEN 500 MG PO TABS
ORAL_TABLET | ORAL | Status: AC
  Filled 2021-03-02: qty 2

## 2021-03-02 MED ORDER — SODIUM BICARBONATE 4.2 % IV SOLN
INTRAVENOUS | Status: DC | PRN
  Administered 2021-03-02: 1 mL via INTRAVENOUS

## 2021-03-02 MED ORDER — LACTATED RINGERS IV SOLN: INTRAVENOUS | Status: DC

## 2021-03-02 MED ORDER — ACETAMINOPHEN 500 MG PO TABS: 1000.0000 mg | ORAL_TABLET | Freq: Three times a day (TID) | ORAL | 0 refills | Status: AC

## 2021-03-02 MED ORDER — CHLORHEXIDINE GLUCONATE CLOTH 2 % EX PADS: 6.0000 | MEDICATED_PAD | Freq: Once | CUTANEOUS | Status: DC

## 2021-03-02 MED ORDER — CEFAZOLIN SODIUM-DEXTROSE 2-4 GM/100ML-% IV SOLN: 2.0000 g | INTRAVENOUS | Status: DC

## 2021-03-02 MED ORDER — LIDOCAINE HCL (PF) 1 % IJ SOLN
INTRAMUSCULAR | Status: AC
  Filled 2021-03-02: qty 5

## 2021-03-02 MED ORDER — ACETAMINOPHEN 500 MG PO TABS
1000.0000 mg | ORAL_TABLET | ORAL | Status: AC
  Administered 2021-03-02: 1000 mg via ORAL

## 2021-03-02 MED ORDER — BUPIVACAINE-EPINEPHRINE (PF) 0.25% -1:200000 IJ SOLN
INTRAMUSCULAR | Status: AC
  Filled 2021-03-02: qty 30

## 2021-03-02 MED ORDER — BUPIVACAINE-EPINEPHRINE 0.25% -1:200000 IJ SOLN
INTRAMUSCULAR | Status: DC | PRN
  Administered 2021-03-02: 9 mL

## 2021-03-02 MED ORDER — SODIUM BICARBONATE 4.2 % IV SOLN
INTRAVENOUS | Status: AC
  Filled 2021-03-02: qty 10

## 2021-03-02 SURGICAL SUPPLY — 55 items
ADH SKN CLS APL DERMABOND .7 (GAUZE/BANDAGES/DRESSINGS) ×1
APL PRP STRL LF DISP 70% ISPRP (MISCELLANEOUS) ×1
APL SKNCLS STERI-STRIP NONHPOA (GAUZE/BANDAGES/DRESSINGS)
BENZOIN TINCTURE PRP APPL 2/3 (GAUZE/BANDAGES/DRESSINGS) IMPLANT
BLADE CLIPPER SURG (BLADE) IMPLANT
BLADE HEX COATED 2.75 (ELECTRODE) ×1 IMPLANT
BLADE SURG 15 STRL LF DISP TIS (BLADE) ×1 IMPLANT
BLADE SURG 15 STRL SS (BLADE) ×2
CANISTER SUCT 1200ML W/VALVE (MISCELLANEOUS) IMPLANT
CHLORAPREP W/TINT 26 (MISCELLANEOUS) ×2 IMPLANT
COVER BACK TABLE 60X90IN (DRAPES) ×2 IMPLANT
COVER MAYO STAND STRL (DRAPES) ×2 IMPLANT
DECANTER SPIKE VIAL GLASS SM (MISCELLANEOUS) IMPLANT
DERMABOND ADVANCED (GAUZE/BANDAGES/DRESSINGS) ×1
DERMABOND ADVANCED .7 DNX12 (GAUZE/BANDAGES/DRESSINGS) ×1 IMPLANT
DRAPE LAPAROTOMY 100X72 PEDS (DRAPES) ×2 IMPLANT
DRAPE UTILITY XL STRL (DRAPES) ×2 IMPLANT
DRSG TEGADERM 4X4.75 (GAUZE/BANDAGES/DRESSINGS) IMPLANT
ELECT COATED BLADE 2.86 ST (ELECTRODE) IMPLANT
ELECT REM PT RETURN 9FT ADLT (ELECTROSURGICAL) ×2
ELECTRODE REM PT RTRN 9FT ADLT (ELECTROSURGICAL) ×1 IMPLANT
GAUZE PACKING IODOFORM 1/4X15 (PACKING) IMPLANT
GAUZE SPONGE 4X4 12PLY STRL (GAUZE/BANDAGES/DRESSINGS) IMPLANT
GAUZE SPONGE 4X4 12PLY STRL LF (GAUZE/BANDAGES/DRESSINGS) IMPLANT
GLOVE SRG 8 PF TXTR STRL LF DI (GLOVE) ×1 IMPLANT
GLOVE SURG ENC MOIS LTX SZ7.5 (GLOVE) ×2 IMPLANT
GLOVE SURG UNDER POLY LF SZ8 (GLOVE) ×2
GOWN STRL REUS W/ TWL LRG LVL3 (GOWN DISPOSABLE) ×1 IMPLANT
GOWN STRL REUS W/ TWL XL LVL3 (GOWN DISPOSABLE) ×1 IMPLANT
GOWN STRL REUS W/TWL LRG LVL3 (GOWN DISPOSABLE) ×2
GOWN STRL REUS W/TWL XL LVL3 (GOWN DISPOSABLE) ×2
NDL HYPO 25X1 1.5 SAFETY (NEEDLE) ×1 IMPLANT
NEEDLE HYPO 25X1 1.5 SAFETY (NEEDLE) ×2 IMPLANT
NS IRRIG 1000ML POUR BTL (IV SOLUTION) ×1 IMPLANT
PACK BASIN DAY SURGERY FS (CUSTOM PROCEDURE TRAY) ×2 IMPLANT
PENCIL SMOKE EVACUATOR (MISCELLANEOUS) ×2 IMPLANT
STRIP CLOSURE SKIN 1/2X4 (GAUZE/BANDAGES/DRESSINGS) IMPLANT
SUT ETHILON 2 0 FS 18 (SUTURE) IMPLANT
SUT ETHILON 4 0 PS 2 18 (SUTURE) IMPLANT
SUT MNCRL AB 4-0 PS2 18 (SUTURE) ×2 IMPLANT
SUT SILK 2 0 SH (SUTURE) IMPLANT
SUT VIC AB 2-0 SH 27 (SUTURE)
SUT VIC AB 2-0 SH 27XBRD (SUTURE) IMPLANT
SUT VIC AB 3-0 SH 27 (SUTURE) ×2
SUT VIC AB 3-0 SH 27X BRD (SUTURE) IMPLANT
SUT VIC AB 4-0 SH 18 (SUTURE) IMPLANT
SUT VICRYL 3-0 CR8 SH (SUTURE) IMPLANT
SUT VICRYL 4-0 PS2 18IN ABS (SUTURE) IMPLANT
SWAB COLLECTION DEVICE MRSA (MISCELLANEOUS) IMPLANT
SWAB CULTURE ESWAB REG 1ML (MISCELLANEOUS) IMPLANT
SYR CONTROL 10ML LL (SYRINGE) ×2 IMPLANT
TOWEL GREEN STERILE FF (TOWEL DISPOSABLE) ×2 IMPLANT
TUBE CONNECTING 20X1/4 (TUBING) ×1 IMPLANT
UNDERPAD 30X36 HEAVY ABSORB (UNDERPADS AND DIAPERS) IMPLANT
YANKAUER SUCT BULB TIP NO VENT (SUCTIONS) ×1 IMPLANT

## 2021-03-02 NOTE — Discharge Instructions (Signed)
[place] Office Phone Number [phone]  POST OP INSTRUCTIONS  Always review your discharge instruction sheet given to you by the facility where your surgery was performed.  I  A prescription for pain medication may be given to you upon discharge.  Take your pain medication as prescribed, if needed.  If narcotic pain medicine is not needed, then you may take acetaminophen (Tylenol) or ibuprofen (Advil) as needed. Take your usually prescribed medications unless otherwise directed If you need a refill on your pain medication, please contact your pharmacy.  They will contact our office to request authorization.  Prescriptions will not be filled after 5pm or on week-ends. You should eat very light the first 24 hours after surgery, such as soup, crackers, pudding, etc.  Resume your normal diet the day after surgery. Most patients will experience some swelling and bruising in the incision area.  Ice packs will help.  Swelling and bruising can take several weeks to resolve.  It is common to experience some constipation if taking pain medication after surgery.  Increasing fluid intake and taking a stool softener will usually help or prevent this problem from occurring.  A mild laxative (Milk of Magnesia or Miralax) should be taken according to package directions if there are no bowel movements after 48 hours. Unless discharge instructions indicate otherwise, you may remove your bandages 24-48 hours after surgery, and you may shower at that time.   If your surgeon used skin glue on the incision, you may shower in 24 hours.  The glue will flake off over the next 2-3 weeks.  Any sutures or staples will be removed at the office during your follow-up visit. ACTIVITIES:  You may resume regular daily activities (gradually increasing) beginning the next day.  Wearing a good support bra or sports bra minimizes pain and swelling.  You may have sexual intercourse when it is comfortable. You may  drive when you no longer are taking prescription pain medication, you can comfortably wear a seatbelt, and you can safely maneuver your car and apply brakes. RETURN TO WORK:  Friday You should see your doctor in the office for a follow-up appointment approximately two weeks after your surgery.  Your doctors nurse will typically make your follow-up appointment when she calls you with your pathology report.  Expect your pathology report 2-3 business days after your surgery.  You may call to check if you do not hear from [place] after three days. OTHER INSTRUCTIONS:  WHEN TO CALL YOUR DOCTOR: Fever over 101.0 Nausea and/or vomiting. Extreme swelling or bruising. Continued bleeding from incision. Increased pain, redness, or drainage from the incision.  The clinic staff is available to answer your questions during regular business hours.  Please dont hesitate to call and ask to speak to one of the nurses for clinical concerns.  If you have a medical emergency, go to the nearest emergency room or call 911.  A surgeon from [facility] is always on call at the hospital.  For further questions, please visit [email or web address]      Managing Your Pain After Surgery Without Opioids    Thank you for participating in our program to help patients manage their pain after surgery without opioids. This is part of our effort to provide you with the best care possible, without exposing you or your family to the risk that opioids pose.  What pain can I expect after surgery? You can expect to have some pain after surgery. This is normal. The pain  is typically worse the day after surgery, and quickly begins to get better. Many studies have found that many patients are able to manage their pain after surgery with Over-the-Counter (OTC) medications such as Tylenol and Motrin. If you have a condition that does not allow you to take Tylenol or Motrin, notify your surgical team.  How will I manage  my pain? The best strategy for controlling your pain after surgery is around the clock pain control with Tylenol (acetaminophen) and Motrin (ibuprofen or Advil). Alternating these medications with each other allows you to maximize your pain control. In addition to Tylenol and Motrin, you can use heating pads or ice packs on your incisions to help reduce your pain.  How will I alternate your regular strength over-the-counter pain medication? You will take a dose of pain medication every three hours. Start by taking 650 mg of Tylenol (2 pills of 325 mg) 3 hours later take 600 mg of Motrin (3 pills of 200 mg) 3 hours after taking the Motrin take 650 mg of Tylenol 3 hours after that take 600 mg of Motrin.   - 1 -  See example - if your first dose of Tylenol is at 12:00 PM   12:00 PM Tylenol 650 mg (2 pills of 325 mg)  3:00 PM Motrin 600 mg (3 pills of 200 mg)  6:00 PM Tylenol 650 mg (2 pills of 325 mg)  9:00 PM Motrin 600 mg (3 pills of 200 mg)  Continue alternating every 3 hours   We recommend that you follow this schedule around-the-clock for at least 3 days after surgery, or until you feel that it is no longer needed. Use the table on the last page of this handout to keep track of the medications you are taking. Important: Do not take more than 3000mg  of Tylenol or 1600mg  of Motrin in a 24-hour period. Do not take ibuprofen/Motrin if you have a history of bleeding stomach ulcers, severe kidney disease, &/or actively taking a blood thinner  What if I still have pain? If you have pain that is not controlled with the over-the-counter pain medications (Tylenol and Motrin or Advil) you might have what we call breakthrough pain. You will receive a prescription for a small amount of an opioid pain medication such as Oxycodone, Tramadol, or Tylenol with Codeine. Use these opioid pills in the first 24 hours after surgery if you have breakthrough pain. Do not take more than 1 pill every 4-6  hours.  If you still have uncontrolled pain after using all opioid pills, don't hesitate to call our staff using the number provided. We will help make sure you are managing your pain in the best way possible, and if necessary, we can provide a prescription for additional pain medication.   Day 1    Time  Name of Medication Number of pills taken  Amount of Acetaminophen  Pain Level   Comments  AM PM       AM PM       AM PM       AM PM       AM PM       AM PM       AM PM       AM PM       Total Daily amount of Acetaminophen Do not take more than  3,000 mg per day      Day 2    Time  Name of Medication Number of pills taken  Amount of Acetaminophen  Pain Level   Comments  AM PM       AM PM       AM PM       AM PM       AM PM       AM PM       AM PM       AM PM       Total Daily amount of Acetaminophen Do not take more than  3,000 mg per day      Day 3    Time  Name of Medication Number of pills taken  Amount of Acetaminophen  Pain Level   Comments  AM PM       AM PM       AM PM       AM PM          AM PM       AM PM       AM PM       AM PM       Total Daily amount of Acetaminophen Do not take more than  3,000 mg per day      Day 4    Time  Name of Medication Number of pills taken  Amount of Acetaminophen  Pain Level   Comments  AM PM       AM PM       AM PM       AM PM       AM PM       AM PM       AM PM       AM PM       Total Daily amount of Acetaminophen Do not take more than  3,000 mg per day      Day 5    Time  Name of Medication Number of pills taken  Amount of Acetaminophen  Pain Level   Comments  AM PM       AM PM       AM PM       AM PM       AM PM       AM PM       AM PM       AM PM       Total Daily amount of Acetaminophen Do not take more than  3,000 mg per day       Day 6    Time  Name of Medication Number of pills taken  Amount of Acetaminophen  Pain Level  Comments  AM PM       AM PM        AM PM       AM PM       AM PM       AM PM       AM PM       AM PM       Total Daily amount of Acetaminophen Do not take more than  3,000 mg per day      Day 7    Time  Name of Medication Number of pills taken  Amount of Acetaminophen  Pain Level   Comments  AM PM       AM PM       AM PM       AM PM       AM PM       AM  PM       AM PM       AM PM       Total Daily amount of Acetaminophen Do not take more than  3,000 mg per day        For additional information about how and where to safely dispose of unused opioid medications - [employer]  Disclaimer: This document contains information and/or instructional materials adapted from [facility] for the typical patient with your condition. It does not replace medical advice from your health care provider because your experience may differ from that of the typical patient. Talk to your health care provider if you have any questions about this document, your condition or your treatment plan. Adapted from [place]

## 2021-03-02 NOTE — Op Note (Signed)
03/02/2021  12:34 PM  PATIENT:  Jordan [name]  41 y.o. female  PRE-OPERATIVE DIAGNOSIS:  LEFT POSTERIOR NECK LIPOMA  POST-OPERATIVE DIAGNOSIS:  LEFT POSTERIOR NECK LIPOMA  PROCEDURE:  Procedure(s):  EXCISION of posterior neck left lipoma 3x2 cm  SURGEON:  Surgeon(s): Greer Pickerel, MD  ASSISTANTS: none   ANESTHESIA:   local  DRAINS: none   LOCAL MEDICATIONS USED:  MARCAINE     EBL: Minimal  SPECIMEN:  Source of Specimen:  lipoma  DISPOSITION OF SPECIMEN:  PATHOLOGY  COUNTS:  YES  INDICATION FOR PROCEDURE: Patient presented with symptomatic left posterior neck subcutaneous mass.  She had had imaging by her PCP that revealed a lipoma.  She desired surgical excision.  Please see outside notes for additional information  PROCEDURE: After obtaining informed consent and confirming the mass location on the patient's left posterior neck hairline and outlining it and marking it with my initials the patient was taken to the OR to at [facility] surgical center and placed on the operating room table in the right lateral position.  The back of her neck and upper back were then prepped and draped in the usual standard surgical fashion with ChloraPrep.  A surgical timeout was performed.  Local consisting of Marcaine with epinephrine mixed with sodium bicarbonate was infiltrated in a regional fashion.  Next a 3 cm horizontal incision was made with a 15 blade.  Deep dermis and subcutaneous tissue was divided with electrocautery.  We encountered the lipoma.  It was excised in its entirety.  It was attached to the fascia of the muscle.  The lipoma measured 3 cm x 2 cm.  The cavity was inspected.  There was no residual lipoma material.  She just had normal subcutaneous fat tissue at this point.  There is no bleeding.  Additional local was infiltrated.  A single interrupted 3-0 Vicryl was placed in the deep tissue.  The skin was then closed with a running 4-0 Monocryl in a subcuticular fashion followed  by the application of Dermabond.  There were no immediate complications.  The patient was taken back to the preoperative area and discharged.  PLAN OF CARE:  discharge from preop  PATIENT DISPOSITION:  Short Stay   Delay start of Pharmacological VTE agent (>24hrs) due to surgical blood loss or risk of bleeding:  not applicable  Leighton Ruff. Redmond Pulling, MD, FACS General, Bariatric, & Minimally Invasive Surgery [facility], [place]
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
## 2021-03-02 NOTE — Progress Notes (Signed)
Virtual Visit via Video Note  I connected with Jordan [name] on 03/02/21 at  4:30 PM EST by a video enabled telemedicine application and verified that I am speaking with the correct person using two identifiers.  Location: Patient: home Provider: home office   I discussed the limitations of evaluation and management by telemedicine and the availability of in person appointments. The patient expressed understanding and agreed to proceed.    Type of Therapy: Individual Therapy Treatment Goals addressed: "help me cope with my mood and all of the changes I am going through". Spenser will experience fewer incidents of mood outbursts, anger, and frustration at home 4 out of 7 days, per self report.    Interventions: CBT and Motivational Interviewing   Summary: ROBBY PIRANI is a 41 y.o. female who presents with Moderate mixed bipolar I disorder, PTSD   Suicidal/Homicidal: No -without intent/plan   Therapist Response:   Janiene met with clinician for an individual session. Zehra discussed her psychiatric symptoms, her current life events and her homework. Erial shared that she had nice holidays with her nieces and her other extended family. She reports she still has not spoken to her sister, who continues to disappoint her and the nieces. Clinician processed thoughts and feelings about her family relationships and noted that both girls and their cousin are currently at the house, cutting up, being silly, and enjoying themselves. Clinician explored Shyna's health and noted some anxiety about the procedure tomorrow to remove a lump from her neck. Lynnelle also shared that she aggravated her back the previous day due to being very busy all day, in and out of the car, and then rehearsing for her play. Betsi shared some increased responsibility at work, which has been fun. 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 Clinician explored social life and noted some concern about her friend, who is  pretty emotionally traumatized and needs to do some serious work on herself before Laraina would enter a real romantic relationship.  Clinician provided review of boundary setting and self care. Clinician also encouraged Laverne to continue her social involvements with the arts and theater.    Plan: Return again in 1-2 weeks.     Diagnosis: Axis I: Moderate mixed bipolar I disorder, PTSD     I discussed the assessment and treatment plan with the patient. The patient was provided an opportunity to ask questions and all were answered. The patient agreed with the plan and demonstrated an understanding of the instructions.   The patient was advised to call back or seek an in-person evaluation if the symptoms worsen or if the condition fails to improve as anticipated.  I provided 45 minutes of non-face-to-face time during this encounter.   [name], LCSW

## 2021-03-03 ENCOUNTER — Encounter (HOSPITAL_BASED_OUTPATIENT_CLINIC_OR_DEPARTMENT_OTHER): Payer: Self-pay | Admitting: General Surgery

## 2021-03-03 LAB — SURGICAL PATHOLOGY

## 2021-03-07 ENCOUNTER — Ambulatory Visit (INDEPENDENT_AMBULATORY_CARE_PROVIDER_SITE_OTHER): Payer: Medicare Other | Admitting: Licensed Clinical Social Worker

## 2021-03-07 ENCOUNTER — Other Ambulatory Visit: Payer: Self-pay

## 2021-03-07 ENCOUNTER — Encounter (HOSPITAL_COMMUNITY): Payer: Self-pay | Admitting: Licensed Clinical Social Worker

## 2021-03-07 DIAGNOSIS — F3132 Bipolar disorder, current episode depressed, moderate: Secondary | ICD-10-CM | POA: Diagnosis not present

## 2021-03-07 NOTE — Progress Notes (Signed)
THERAPIST PROGRESS NOTE ° °Session Time: 8:00am-8:55am ° °Participation Level: Active ° °Behavioral Response: Well GroomedAlertAnxious and Depressed ° °Type of Therapy: Individual Therapy ° °Treatment Goals addressed: "help me cope with my mood and all of the changes I am going through". [name] will experience fewer incidents of mood outbursts, anger, and frustration at home 4 out of 7 days, per self report.  °  °Interventions: CBT and Motivational Interviewing °  °Summary: Jordan [name] is a 40 y.o. female who presents with Moderate mixed bipolar I disorder, PTSD °  °Suicidal/Homicidal: No -without intent/plan °  °Therapist Response: °  °Landen met with clinician for an individual session. Anisa discussed her psychiatric symptoms, her current life events and her homework. Kyndall shared that she has been doing okay. She provided updates on her friends and family. Clinician utilized MI OARS to reflect and summarize thoughts and feelings about her role in relationships and identified that she has been named “the glue” that holds everyone together. Clinician processed that role and that position in her different social and family groups. Clinician discussed options for coping with her people, noting that they are adults and make their own decisions. Clinician also utilized CBT to process the importance of building and maintaining boundaries between her and the other people in her life. Clinician processed a recent panic attack that occurred during play rehearsal. 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 Clinician discussed the trigger, which was likely a PTSD reminder (flashing lights, loud noises, darkness).  °  °  °Plan: Return again in 1-2 weeks.   °  °Diagnosis: Axis I: Moderate mixed bipolar I disorder, PTSD   °  ° ° R , LCSW °03/07/2021 ° °

## 2021-03-14 ENCOUNTER — Ambulatory Visit (INDEPENDENT_AMBULATORY_CARE_PROVIDER_SITE_OTHER): Payer: Medicare Other | Admitting: Licensed Clinical Social Worker

## 2021-03-14 ENCOUNTER — Other Ambulatory Visit: Payer: Self-pay

## 2021-03-14 DIAGNOSIS — F4310 Post-traumatic stress disorder, unspecified: Secondary | ICD-10-CM

## 2021-03-14 DIAGNOSIS — F3132 Bipolar disorder, current episode depressed, moderate: Secondary | ICD-10-CM | POA: Diagnosis not present

## 2021-03-15 ENCOUNTER — Ambulatory Visit (INDEPENDENT_AMBULATORY_CARE_PROVIDER_SITE_OTHER): Payer: Medicare Other | Admitting: Obstetrics and Gynecology

## 2021-03-15 VITALS — BP 102/70 | HR 64 | Ht 69.0 in | Wt 271.8 lb

## 2021-03-15 DIAGNOSIS — D250 Submucous leiomyoma of uterus: Secondary | ICD-10-CM

## 2021-03-15 NOTE — Progress Notes (Signed)
°  CC: Sonata follow up, ultrasound follow up Subjective:    Patient ID: Jordan [name], female    DOB: May 16, 1980, 41 y.o.   MRN: [id number]  HPI Pt seen for ultrasound follow up s/p Sonata procedure.  She reports her bleeding is much improved.  Cycles are regular and last 5-6 days.  Only the first day is heavy, and even that is improved from previous.  Pt concerned about previous right ovarian cyst. She has no symptoms currently.  Explained that on ultrasound the right ovary was not well visualized.   Review of Systems     Objective:   Physical Exam Vitals:   03/15/21 0850  BP: 102/70  Pulse: 64   CLINICAL DATA:  Follow-up fibroid post hysteroscopy and fibroid ablation, ovarian cyst follow-up, LMP 01/10/2021   EXAM: TRANSABDOMINAL AND TRANSVAGINAL ULTRASOUND OF PELVIS   TECHNIQUE: Both transabdominal and transvaginal ultrasound examinations of the pelvis were performed. Transabdominal technique was performed for global imaging of the pelvis including uterus, ovaries, adnexal regions, and pelvic cul-de-sac. It was necessary to proceed with endovaginal exam following the transabdominal exam to visualize the uterus, endometrium, and ovaries.   COMPARISON:  08/05/2020   FINDINGS: Uterus   Measurements: 8.6 x 4.5 x 8.0 cm = volume: 163 ML. Retroflexed. Heterogeneous myometrium. Subserosal leiomyoma at anterior upper uterus 4.1 x 3.0 x 4.6 cm previously 4.5 x 3.8 x 4.5 cm. Submucosal leiomyoma anterior upper uterus 2.4 x 1.3 x 2.4 cm previously 2.3 x 1.9 x 1.8 cm. Exophytic leiomyoma at LEFT fundus 2.9 x 2.6 x 3.2 cm previously 3.3 x 3.4 x 3.0 cm.   Endometrium   Thickness: 8 mm.  No endometrial fluid   Right ovary   Not visualized, likely obscured by bowel   Left ovary   Surgically absent   Other findings   Trace free pelvic fluid.  No adnexal masses.   IMPRESSION: Surgical absence of LEFT ovary with nonvisualization of RIGHT ovary.   Multiple uterine  leiomyomata, little changed.   Otherwise unremarkable uterus and endometrial complex.      Assessment & Plan:   1. Fibroids, submucosal At this point, apparent successful Sonata procedure. Pt advised to follow up with any adnexal pain/issues, otherwise follow up 12/23 for AE/pap.  I spent 10 minutes dedicated to the care of this patient including previsit review of records, face to face time with the patient discussing ultrasound findings and post visit testing.     Griffin Basil, MD Faculty Attending, [facility]

## 2021-03-21 ENCOUNTER — Other Ambulatory Visit: Payer: Self-pay

## 2021-03-21 ENCOUNTER — Ambulatory Visit (INDEPENDENT_AMBULATORY_CARE_PROVIDER_SITE_OTHER): Payer: Medicare Other | Admitting: Licensed Clinical Social Worker

## 2021-03-21 ENCOUNTER — Encounter (HOSPITAL_COMMUNITY): Payer: Self-pay | Admitting: Licensed Clinical Social Worker

## 2021-03-21 DIAGNOSIS — F3132 Bipolar disorder, current episode depressed, moderate: Secondary | ICD-10-CM | POA: Diagnosis not present

## 2021-03-21 NOTE — Progress Notes (Signed)
° °  THERAPIST PROGRESS NOTE  Session Time: 9:00am-9:50am  Participation Level: Active  Behavioral Response: Well GroomedAlertIrritable  Type of Therapy: Individual Therapy  Treatment Goals addressed: "help me cope with my mood and all of the changes I am going through". [name] will experience fewer incidents of mood outbursts, anger, and frustration at home 4 out of 7 days, per self report.    Interventions: CBT and Motivational Interviewing   Summary: Jordan [name] is a 41 y.o. female who presents with Moderate mixed bipolar I disorder, PTSD   Suicidal/Homicidal: No -without intent/plan   Therapist Response:   Kriste met with clinician for an individual session. Kemyra discussed her psychiatric symptoms, her current life events and her homework. Sonali shared that she has been feeling overwhelmed and increasingly anxious due to her household and her friends. Clinician processed current stressors and discussed coping skills. Clinician processed communication skills and identified ways to let her family know what she needs. Clinician processed after-effects of the play and noted that this was a growth opportunity. Marsi agreed and is currently uncertain that she will do that again. However, she did note that she has grown through the process.     Plan: Return again in 1-2 weeks.     Diagnosis: Axis I: Moderate mixed bipolar I disorder, PTSD    [name], LCSW 03/21/2021

## 2021-03-23 DIAGNOSIS — Z79899 Other long term (current) drug therapy: Secondary | ICD-10-CM | POA: Diagnosis not present

## 2021-03-24 ENCOUNTER — Ambulatory Visit
Admission: RE | Admit: 2021-03-24 | Discharge: 2021-03-24 | Disposition: A | Discharge status: Home / Self Care | Payer: Medicare Other | Source: Ambulatory Visit | Attending: Internal Medicine | Admitting: Internal Medicine

## 2021-03-24 ENCOUNTER — Telehealth (HOSPITAL_COMMUNITY): Payer: Self-pay | Admitting: *Deleted

## 2021-03-24 ENCOUNTER — Other Ambulatory Visit: Payer: Self-pay | Admitting: Internal Medicine

## 2021-03-24 ENCOUNTER — Other Ambulatory Visit (HOSPITAL_COMMUNITY): Payer: Self-pay | Admitting: *Deleted

## 2021-03-24 DIAGNOSIS — R922 Inconclusive mammogram: Secondary | ICD-10-CM | POA: Diagnosis not present

## 2021-03-24 DIAGNOSIS — F4310 Post-traumatic stress disorder, unspecified: Secondary | ICD-10-CM

## 2021-03-24 DIAGNOSIS — R928 Other abnormal and inconclusive findings on diagnostic imaging of breast: Secondary | ICD-10-CM

## 2021-03-24 DIAGNOSIS — F3132 Bipolar disorder, current episode depressed, moderate: Secondary | ICD-10-CM

## 2021-03-24 DIAGNOSIS — N6310 Unspecified lump in the right breast, unspecified quadrant: Secondary | ICD-10-CM

## 2021-03-24 LAB — VALPROIC ACID LEVEL: Valproic Acid Lvl: 54 ug/mL (ref 50–100)

## 2021-03-24 MED ORDER — DIVALPROEX SODIUM ER 500 MG PO TB24: 1000.0000 mg | ORAL_TABLET | Freq: Every day | ORAL | 0 refills | Status: DC

## 2021-03-24 NOTE — Telephone Encounter (Signed)
Can she try 1000 mg Depakote?  Her level is therapeutic but low.  If she agree then please call her pharmacy.

## 2021-03-24 NOTE — Telephone Encounter (Signed)
Valroic acid level received from [facility] resulted on 03/24/21: 54 ug/ml.

## 2021-03-24 NOTE — Telephone Encounter (Signed)
Done

## 2021-03-28 ENCOUNTER — Ambulatory Visit (INDEPENDENT_AMBULATORY_CARE_PROVIDER_SITE_OTHER): Payer: Medicare Other | Admitting: Licensed Clinical Social Worker

## 2021-03-28 ENCOUNTER — Other Ambulatory Visit: Payer: Self-pay | Admitting: Internal Medicine

## 2021-03-28 ENCOUNTER — Encounter (HOSPITAL_COMMUNITY): Payer: Self-pay | Admitting: Licensed Clinical Social Worker

## 2021-03-28 ENCOUNTER — Other Ambulatory Visit: Payer: Self-pay

## 2021-03-28 DIAGNOSIS — M797 Fibromyalgia: Secondary | ICD-10-CM

## 2021-03-28 DIAGNOSIS — F4310 Post-traumatic stress disorder, unspecified: Secondary | ICD-10-CM

## 2021-03-28 DIAGNOSIS — F3132 Bipolar disorder, current episode depressed, moderate: Secondary | ICD-10-CM

## 2021-03-28 NOTE — Progress Notes (Signed)
° °  THERAPIST PROGRESS NOTE  Session Time: 8:00am-8:50am  Participation Level: Active  Behavioral Response: Well GroomedAlertDepressed and Irritable  Type of Therapy: Individual Therapy  Treatment Goals addressed: "help me cope with my mood and all of the changes I am going through". Aleria will experience fewer incidents of mood outbursts, anger, and frustration at home 4 out of 7 days, per self report.    Interventions: CBT and Motivational Interviewing   Summary: Jordan [name] is a 41 y.o. female who presents with Moderate mixed bipolar I disorder, PTSD   Suicidal/Homicidal: No -without intent/plan   Therapist Response:   Jordan [name] met with clinician for an individual session. Jordan [name] discussed her psychiatric symptoms, her current life events and her homework. Jordan [name] shared that she continues to feel frustrated and overwhelmed by her nieces, her friends, work, and financial situation. Clinician utilized MI OARS to reflect and summarize the numerous things going on at this time. Clinician noted ongoing struggles with boundary setting with her friend. Clinician identified preparation and readiness to send younger niece to college. Clinician identified the importance of maintaining her own self-care and requesting radio silence at times in her home. Jordan [name] processed concerns about younger niece and her possible chances of dropping out of college or getting expelled because she is lazy and immature. Clinician encouraged Jordan [name] to share her concerns with counselor so they can work on motivation in therapy.    Plan: Return again in 1-2 weeks.     Diagnosis: Axis I: Moderate mixed bipolar I disorder, PTSD      [name], LCSW 03/28/2021

## 2021-03-28 NOTE — Progress Notes (Signed)
° °  THERAPIST PROGRESS NOTE  Session Time: 3:30pm-4:20pm  Participation Level: Active  Behavioral Response: Well GroomedAlert and DrowsyDepressed and Irritable  Type of Therapy: Individual Therapy  Treatment Goals addressed: "help me cope with my mood and all of the changes I am going through". Margurette will experience fewer incidents of mood outbursts, anger, and frustration at home 4 out of 7 days, per self report.    Interventions: CBT and Motivational Interviewing   Summary: ANNALYN BLECHER is a 41 y.o. female who presents with Moderate mixed bipolar I disorder, PTSD   Suicidal/Homicidal: No -without intent/plan   Therapist Response:   Tressie met with clinician for an individual session. Shaylon discussed her psychiatric symptoms, her current life events and her homework. Joyleen shared that her blood work was finally done and it came back low for Depakote. Alexya identified that her increased dosage started Friday and she is feeling more tired and irritable this week. Clinician processed current mood using CBT. Clinician explored interactions with friends and family, noting increased moodiness and irritability, even at work. Clinician discussed the importance of communicating her needs to her friends and colleagues. Clinician also identified the value of having quiet time in a quiet space to self-care. Kayce shared that she attended her niece's last therapy appointment and was able to provide feedback and updates to the therapist. This has been helpful for niece to actually address the things she was avoiding.    Plan: Return again in 1-2 weeks.     Diagnosis: Axis I: Moderate mixed bipolar I disorder, PTSD         [name], LCSW 03/28/2021

## 2021-03-29 NOTE — Telephone Encounter (Signed)
Approved 3 month refill of lyrica. Please have patient schedule follow up with me, I have not seen her since 2019.

## 2021-04-04 ENCOUNTER — Ambulatory Visit (INDEPENDENT_AMBULATORY_CARE_PROVIDER_SITE_OTHER): Payer: Medicare Other | Admitting: Licensed Clinical Social Worker

## 2021-04-04 ENCOUNTER — Encounter (HOSPITAL_COMMUNITY): Payer: Self-pay | Admitting: Licensed Clinical Social Worker

## 2021-04-04 ENCOUNTER — Other Ambulatory Visit: Payer: Self-pay

## 2021-04-04 DIAGNOSIS — F3132 Bipolar disorder, current episode depressed, moderate: Secondary | ICD-10-CM

## 2021-04-04 NOTE — Progress Notes (Signed)
° °  THERAPIST PROGRESS NOTE  Session Time: 8:00am-9:00am  Participation Level: Active  Behavioral Response: Well GroomedAlertEuthymic and Irritable  Type of Therapy: Individual Therapy  Treatment Goals addressed: Treatment Goals addressed: "help me cope with my mood and all of the changes I am going through". Sarinity will experience fewer incidents of mood outbursts, anger, and frustration at home 4 out of 7 days, per self report.   ProgressTowards Goals: Progressing  Interventions: CBT and Motivational Interviewing  Summary: DEWANA AMMIRATI is a 41 y.o. female who presents with Bipolar Affective Disorder, currently depressed, moderate   Suicidal/Homicidal: Nowithout intent/plan  Therapist Response: [employer] met with clinician for an individual session. Deolinda discussed her psychiatric symptoms, her current life events and her homework. Tynia shared that she has been struggling with her friend who "needs therapy". Clinician utilized CBT to process thoughts, feelings, and behaviors associated with this friend, who tends to trigger Kalyn's irritability. Clinician explored coping skills and communication of boundaries. Clinician noted improvement in Nyiesha's ability to say how she really feels about this woman and to choose different, more appropriate coping skills than becoming verbally belligerent and aggressive. Clinician explored other friendships and noted improvement in overall mood when spending time with more positive people. Clinician discussed updates with nieces and noted that both girls are progressing well with work and school.   Plan: Return again in 1-2 weeks.  Diagnosis: Bipolar affective disorder, currently depressed, moderate ([place])  Collaboration of Care: none required at this session  Patient/Guardian was advised Release of Information must be obtained prior to any record release in order to collaborate their care with an outside provider. Patient/Guardian was advised if  they have not already done so to contact the registration department to sign all necessary forms in order for [place] to release information regarding their care.   Consent: Patient/Guardian gives verbal consent for treatment and assignment of benefits for services provided during this telehealth visit. Patient/Guardian expressed understanding and agreed to proceed.   [name] [place], LCSW 04/04/2021

## 2021-04-05 ENCOUNTER — Ambulatory Visit (INDEPENDENT_AMBULATORY_CARE_PROVIDER_SITE_OTHER): Payer: Medicare Other | Admitting: Student

## 2021-04-05 ENCOUNTER — Encounter: Payer: Self-pay | Admitting: Student

## 2021-04-05 ENCOUNTER — Other Ambulatory Visit (HOSPITAL_COMMUNITY)
Admission: RE | Admit: 2021-04-05 | Discharge: 2021-04-05 | Disposition: A | Discharge status: Home / Self Care | Payer: Medicare Other | Source: Ambulatory Visit | Attending: Internal Medicine | Admitting: Internal Medicine

## 2021-04-05 VITALS — BP 98/66 | HR 65 | Temp 98.4°F | Ht 69.0 in | Wt 271.3 lb

## 2021-04-05 DIAGNOSIS — Z202 Contact with and (suspected) exposure to infections with a predominantly sexual mode of transmission: Secondary | ICD-10-CM | POA: Diagnosis not present

## 2021-04-05 DIAGNOSIS — F319 Bipolar disorder, unspecified: Secondary | ICD-10-CM | POA: Diagnosis not present

## 2021-04-05 DIAGNOSIS — Z7251 High risk heterosexual behavior: Secondary | ICD-10-CM | POA: Diagnosis present

## 2021-04-05 DIAGNOSIS — S62664D Nondisplaced fracture of distal phalanx of right ring finger, subsequent encounter for fracture with routine healing: Secondary | ICD-10-CM | POA: Diagnosis not present

## 2021-04-05 NOTE — Assessment & Plan Note (Addendum)
Patient is here to follow-up on her right ring finger distal phalanx fracture.  She reports jamming the finger onto the carpet causing subungual trauma and hemorrhage.  Fracture is very minor on x-ray.  She was put on a splint but has removed.  She reports normal range of motion and denies any pain.  She lost the fingernail and it started to grow back. -No other intervention indicated.
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
## 2021-04-05 NOTE — Patient Instructions (Signed)
Jordan [name],  It was a pleasure seeing you in the clinic today.   Your finger injury looks like it is healing properly.   2. I ordered testing for HIV, syphillis, Hepatitis C, gonorrhea and chlamydia. I will call you when the results come back.  3. Please follow up with your 6 month breast ultrasound.  Please return in 1 year, sooner if needed.  Take care,   Dr. Alfonse Spruce

## 2021-04-05 NOTE — Assessment & Plan Note (Addendum)
Recent visit with behavioral health provider.  Her Depakote was increased to 1000 mg daily.  Depakote level checked recently.  Reports stable mood.
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
## 2021-04-06 LAB — CERVICOVAGINAL ANCILLARY ONLY
Bacterial Vaginitis (gardnerella): POSITIVE — AB
Chlamydia: NEGATIVE
Comment: NEGATIVE
Comment: NEGATIVE
Comment: NEGATIVE
Comment: NORMAL
Neisseria Gonorrhea: NEGATIVE
Trichomonas: NEGATIVE

## 2021-04-06 LAB — HIV ANTIBODY (ROUTINE TESTING W REFLEX): HIV Screen 4th Generation wRfx: NONREACTIVE

## 2021-04-06 LAB — HEPATITIS C ANTIBODY: Hep C Virus Ab: NONREACTIVE

## 2021-04-06 LAB — RPR: RPR Ser Ql: NONREACTIVE

## 2021-04-06 NOTE — Progress Notes (Signed)
Internal Medicine Clinic Attending  Case discussed with Dr. Nguyen  At the time of the visit.  We reviewed the resident's history and exam and pertinent patient test results.  I agree with the assessment, diagnosis, and plan of care documented in the resident's note. 

## 2021-04-11 ENCOUNTER — Other Ambulatory Visit: Payer: Self-pay

## 2021-04-11 ENCOUNTER — Ambulatory Visit (INDEPENDENT_AMBULATORY_CARE_PROVIDER_SITE_OTHER): Payer: Medicare Other | Admitting: Licensed Clinical Social Worker

## 2021-04-11 ENCOUNTER — Encounter (HOSPITAL_COMMUNITY): Payer: Self-pay | Admitting: Licensed Clinical Social Worker

## 2021-04-11 DIAGNOSIS — F3132 Bipolar disorder, current episode depressed, moderate: Secondary | ICD-10-CM | POA: Diagnosis not present

## 2021-04-11 NOTE — Progress Notes (Signed)
° °  THERAPIST PROGRESS NOTE  Session Time: 9:00am-9:55am  Participation Level: Active  Behavioral Response: Well GroomedAlertIrritable  Type of Therapy: Individual Therapy  Treatment Goals addressed: Treatment Goals addressed: "help me cope with my mood and all of the changes I am going through". Trishna will experience fewer incidents of mood outbursts, anger, and frustration at home 4 out of 7 days, per self report.  ProgressTowards Goals: Progressing  Interventions: CBT and Motivational Interviewing  Summary: Jordan [name] is a 41 y.o. female who presents with Bipolar Affective Disorder, currently depressed, moderate   Suicidal/Homicidal: Nowithout intent/plan  Therapist Response: [name] met with clinician for an individual session. [name] discussed her psychiatric symptoms, current life events, and homework. 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 Clinician processed thoughts and feelings about the event using CBT. 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 Clinician discussed meaning making of that experience using MI. Clinician reflected the pride [name] felt and the excitement that others liked her work.  Clinician discussed relationships with nieces and friends. Clinician identified internal struggle between wanting to be involved and a part of the group, as well as needing privacy and space. Clinician discussed boundaries and identified the importance of communicating those boundaries in different ways from "cussing everyone out". Clinician provided support and a possible "script" to explain how she feels when she needs more space.   Plan: Return again in 1-2 weeks.  Diagnosis: Bipolar affective disorder, currently depressed, moderate ([place])  Collaboration of Care: Dr. Adele Schilder  Patient/Guardian was advised Release of Information must be  obtained prior to any record release in order to collaborate their care with an outside provider. Patient/Guardian was advised if they have not already done so to contact the registration department to sign all necessary forms in order for [place] to release information regarding their care.   Consent: Patient/Guardian gives verbal consent for treatment and assignment of benefits for services provided during this visit. Patient/Guardian expressed understanding and agreed to proceed.   [name] [place], LCSW 04/11/2021

## 2021-04-13 DIAGNOSIS — M25562 Pain in left knee: Secondary | ICD-10-CM | POA: Diagnosis not present

## 2021-04-15 ENCOUNTER — Other Ambulatory Visit (HOSPITAL_COMMUNITY): Payer: Self-pay | Admitting: Psychiatry

## 2021-04-15 DIAGNOSIS — F3132 Bipolar disorder, current episode depressed, moderate: Secondary | ICD-10-CM

## 2021-04-15 DIAGNOSIS — F4310 Post-traumatic stress disorder, unspecified: Secondary | ICD-10-CM

## 2021-04-16 ENCOUNTER — Encounter: Payer: Self-pay | Admitting: Internal Medicine

## 2021-04-16 DIAGNOSIS — B9689 Other specified bacterial agents as the cause of diseases classified elsewhere: Secondary | ICD-10-CM

## 2021-04-18 ENCOUNTER — Encounter (HOSPITAL_COMMUNITY): Payer: Self-pay | Admitting: Licensed Clinical Social Worker

## 2021-04-18 ENCOUNTER — Other Ambulatory Visit: Payer: Self-pay

## 2021-04-18 ENCOUNTER — Ambulatory Visit (INDEPENDENT_AMBULATORY_CARE_PROVIDER_SITE_OTHER): Payer: Medicare Other | Admitting: Licensed Clinical Social Worker

## 2021-04-18 DIAGNOSIS — F3132 Bipolar disorder, current episode depressed, moderate: Secondary | ICD-10-CM | POA: Diagnosis not present

## 2021-04-18 MED ORDER — METRONIDAZOLE 500 MG PO TABS: 500.0000 mg | ORAL_TABLET | Freq: Two times a day (BID) | ORAL | 0 refills | Status: AC

## 2021-04-18 NOTE — Progress Notes (Signed)
° °  THERAPIST PROGRESS NOTE  Session Time: 3:30pm-4:20pm  Participation Level: Active  Behavioral Response: Well GroomedAlertDepressed and Irritable  Type of Therapy: Individual Therapy  Treatment Goals addressed: "help me cope with my mood and all of the changes I am going through". Phenix will experience fewer incidents of mood outbursts, anger, and frustration at home 4 out of 7 days, per self report.  ProgressTowards Goals: Progressing  Interventions: CBT  Summary: DEVERY ODWYER is a 41 y.o. female who presents with Bipolar Affective Disorder, currently depressed, moderate.   Suicidal/Homicidal: Nowithout intent/plan  Therapist Response: Raseel engaged well in session with clinician. Clinician utilized CBT to process recent events in the family and her job. Clinician explored reality of niece turning 74 and being in the process of becoming an adult. Clinician discussed Marlynn's thoughts and feelings, some surprise sadness that there will be this change for niece once she graduates and goes to college. Clinician reflected the mixed feelings of wanting to have a quiet and peaceful home, but also not being quite ready for the empty nest. Clinician processed the importance of maintaining connections with friends and support system. Clinician also reviewed coping skills for dealing with anger and frustration. Mood has improved due to increase in Depakote, no outbursts this week.   Plan: Return again in 1-2 weeks.  Diagnosis: Bipolar affective disorder, currently depressed, moderate ([place])  Collaboration of Care: Other none necessary at this session  Patient/Guardian was advised Release of Information must be obtained prior to any record release in order to collaborate their care with an outside provider. Patient/Guardian was advised if they have not already done so to contact the registration department to sign all necessary forms in order for [place] to release information regarding their  care.   Consent: Patient/Guardian gives verbal consent for treatment and assignment of benefits for services provided during this visit. Patient/Guardian expressed understanding and agreed to proceed.   [name] [place], LCSW 04/18/2021

## 2021-04-19 ENCOUNTER — Other Ambulatory Visit (HOSPITAL_COMMUNITY): Payer: Self-pay | Admitting: *Deleted

## 2021-04-19 ENCOUNTER — Encounter (HOSPITAL_COMMUNITY): Payer: Self-pay | Admitting: Psychiatry

## 2021-04-19 ENCOUNTER — Telehealth (HOSPITAL_BASED_OUTPATIENT_CLINIC_OR_DEPARTMENT_OTHER): Payer: Medicare Other | Admitting: Psychiatry

## 2021-04-19 DIAGNOSIS — F3132 Bipolar disorder, current episode depressed, moderate: Secondary | ICD-10-CM

## 2021-04-19 DIAGNOSIS — Z5181 Encounter for therapeutic drug level monitoring: Secondary | ICD-10-CM

## 2021-04-19 DIAGNOSIS — F419 Anxiety disorder, unspecified: Secondary | ICD-10-CM | POA: Diagnosis not present

## 2021-04-19 DIAGNOSIS — F4310 Post-traumatic stress disorder, unspecified: Secondary | ICD-10-CM

## 2021-04-19 DIAGNOSIS — M25562 Pain in left knee: Secondary | ICD-10-CM | POA: Diagnosis not present

## 2021-04-19 MED ORDER — HYDROXYZINE HCL 10 MG PO TABS: 10.0000 mg | ORAL_TABLET | Freq: Every day | ORAL | 2 refills | Status: DC | PRN

## 2021-04-19 MED ORDER — DIVALPROEX SODIUM ER 500 MG PO TB24: 1000.0000 mg | ORAL_TABLET | Freq: Every day | ORAL | 2 refills | Status: DC
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
## 2021-04-25 ENCOUNTER — Encounter (HOSPITAL_COMMUNITY): Payer: Self-pay | Admitting: Licensed Clinical Social Worker

## 2021-04-25 ENCOUNTER — Other Ambulatory Visit: Payer: Self-pay

## 2021-04-25 ENCOUNTER — Ambulatory Visit (INDEPENDENT_AMBULATORY_CARE_PROVIDER_SITE_OTHER): Payer: Medicare Other | Admitting: Licensed Clinical Social Worker

## 2021-04-25 DIAGNOSIS — F3132 Bipolar disorder, current episode depressed, moderate: Secondary | ICD-10-CM

## 2021-04-25 NOTE — Progress Notes (Signed)
? ?  THERAPIST PROGRESS NOTE ? ?Session Time: 8:00am-8:50am ? ?Participation Level: Active ? ?Behavioral Response: Well GroomedAlertIrritable ? ?Type of Therapy: Individual Therapy ? ?Treatment Goals addressed: "help me cope with my mood and all of the changes I am going through". [name] will experience fewer incidents of mood outbursts, anger, and frustration at home 4 out of 7 days, per self report. ? ?ProgressTowards Goals: Progressing ? ?Interventions: CBT ? ?Summary: Jordan [name] is a 41 y.o. female who presents with Bipolar Affective Disorder, currently depressed, moderate.  ? ?Suicidal/Homicidal: Nowithout intent/plan ? ?Therapist Response: Asako engaged well in session with clinician. Clinician utilized CBT to process interactions and events in the family. Clinician explored interactions with nieces and noted frustration with her younger niece's behavior over the weekend for her birthday. Clinician reflected the effort put forth by Jordan [name] and other niece. Clinician also identified the challenges the birthday girl may have experienced being the center of attention and not having some of her expectations met. Clinician discussed coping skills used by Jordan [name] and explored alternative options. Clinician discussed boundaries needed between [name] and a friend. Clinician also noted the importance of expressing and ensuring those boundaries are clear, for everyone to be comfortable.  ? ?Plan: Return again in 1 week. ? ?Diagnosis: Bipolar affective disorder, currently depressed, moderate ([place]) ? ?Collaboration of Care: Other none required at this session.  ? ?Patient/Guardian was advised Release of Information must be obtained prior to any record release in order to collaborate their care with an outside provider. Patient/Guardian was advised if they have not already done so to contact the registration department to sign all necessary forms in order for [place] to release information regarding their care.  ? ?Consent:  Patient/Guardian gives verbal consent for treatment and assignment of benefits for services provided during this visit. Patient/Guardian expressed understanding and agreed to proceed.  ? ?[name], LCSW ?04/25/2021 ? ?

## 2021-05-02 ENCOUNTER — Ambulatory Visit (INDEPENDENT_AMBULATORY_CARE_PROVIDER_SITE_OTHER): Payer: Medicare Other | Admitting: Licensed Clinical Social Worker

## 2021-05-02 ENCOUNTER — Other Ambulatory Visit: Payer: Self-pay

## 2021-05-02 DIAGNOSIS — F3132 Bipolar disorder, current episode depressed, moderate: Secondary | ICD-10-CM | POA: Diagnosis not present

## 2021-05-02 NOTE — Progress Notes (Signed)
? ? ?  THERAPIST PROGRESS NOTE ? ?Session Time: 9:00am-9:50am ? ?Participation Level: Active ? ?Behavioral Response: Well GroomedAlertIrritable ? ?Type of Therapy: Individual Therapy ? ?Treatment Goals addressed:  "help me cope with my mood and all of the changes I am going through". Jordan [name] will experience fewer incidents of mood outbursts, anger, and frustration at home 4 out of 7 days, per self report. ? ?ProgressTowards Goals: Progressing ? ?Interventions: CBT ? ?Summary: Jordan [name] is a 41 y.o. female who presents with Bipolar Affective Disorder, currently depressed, moderate.  ? ?Suicidal/Homicidal: Nowithout intent/plan ? ?Therapist Response: Jeanita engaged well in individual session with clinician. Clinician utilized CBT to process thoughts, feelings, and behaviors over the past week. Clinician identified ongoing triggers associated with her family, particularly with niece. Clinician processed interactions that have recently occurred. Clinician processed that niece triggers thoughts and provides reminders about her own childhood growing up with her mother and sister. Clinician noted that since niece did not come to live with [name] until age 48, 92 of those old behaviors from mom and sister are present in niece's behavior. Clinician encouraged [name] to continue using positive self talk, self care, and calming/grounding techniques to reduce her frustration.  ? ? ?Plan: Return again in 2 weeks. ? ?Diagnosis: Bipolar affective disorder, currently depressed, moderate ([place]) ? ?Collaboration of Care: Other none required in this session.  ? ?Patient/Guardian was advised Release of Information must be obtained prior to any record release in order to collaborate their care with an outside provider. Patient/Guardian was advised if they have not already done so to contact the registration department to sign all necessary forms in order for [place] to release information regarding their care.  ? ?Consent:  Patient/Guardian gives verbal consent for treatment and assignment of benefits for services provided during this visit. Patient/Guardian expressed understanding and agreed to proceed.  ? ?[name], LCSW ?05/02/2021 ? ?

## 2021-05-04 ENCOUNTER — Encounter (HOSPITAL_COMMUNITY): Payer: Self-pay | Admitting: Licensed Clinical Social Worker

## 2021-05-09 ENCOUNTER — Other Ambulatory Visit: Payer: Self-pay

## 2021-05-09 ENCOUNTER — Ambulatory Visit (INDEPENDENT_AMBULATORY_CARE_PROVIDER_SITE_OTHER): Payer: Medicare Other | Admitting: Obstetrics and Gynecology

## 2021-05-09 ENCOUNTER — Ambulatory Visit (INDEPENDENT_AMBULATORY_CARE_PROVIDER_SITE_OTHER): Payer: Medicare Other | Admitting: Licensed Clinical Social Worker

## 2021-05-09 ENCOUNTER — Encounter (HOSPITAL_COMMUNITY): Payer: Self-pay | Admitting: Licensed Clinical Social Worker

## 2021-05-09 VITALS — BP 110/73 | HR 62 | Ht 69.0 in | Wt 271.0 lb

## 2021-05-09 DIAGNOSIS — F3132 Bipolar disorder, current episode depressed, moderate: Secondary | ICD-10-CM

## 2021-05-09 DIAGNOSIS — N920 Excessive and frequent menstruation with regular cycle: Secondary | ICD-10-CM | POA: Diagnosis not present

## 2021-05-09 MED ORDER — MEGESTROL ACETATE 40 MG PO TABS: 40.0000 mg | ORAL_TABLET | Freq: Two times a day (BID) | ORAL | 5 refills | Status: DC

## 2021-05-09 NOTE — Progress Notes (Signed)
?  CC: prolonged menstrual bleeding ?Subjective:  ? ? Patient ID: Jordan [name], female    DOB: Apr 24, 1980, 41 y.o.   MRN: [id number] ? ?HPI  41 yo G0 seen for complaint of prolonged menstrual bleeding.  .  Pt had Sonata procedure 10/04/20.  Pt had normal duration and lighter menses until 04/18/21. Menses had been 5-7 days. The bleeding started with spotting and then she bled for a total of 3 weeks , sometimes with clots. ? ? ? ?Review of Systems ? ?   ?Objective:  ? Physical Exam ?Vitals:  ? 05/09/21 1440  ?BP: 110/73  ?Pulse: 62  ? ? ? ? ?   ?Assessment & Plan:  ? ?1. Menorrhagia with regular cycle ?Will reevaluate uterus with pelvic ultrasound. ?Discussed options including expectant management, possible hysteroscopic resection of fibroid, or hysterectomy as a last resort.  ?Rx for megace as a temporizing measure until  ultrasound can be completed. ?F/u in 1 month for gyn and u/s follow up. ? ?- US PELVIC COMPLETE WITH TRANSVAGINAL; Future ?- megestrol (MEGACE) 40 MG tablet; Take 1 tablet (40 mg total) by mouth 2 (two) times daily. Can increase to two tablets twice a day in the event of heavy bleeding  Dispense: 60 tablet; Refill: 5 ? ?I spent 40 minutes dedicated to the care of this patient including previsit review of records, face to face time with the patient discussing ultrasound findings, treatment options and post visit testing.  ? ?Griffin Basil, MD ?Faculty Attending, [facility]  ?

## 2021-05-09 NOTE — Progress Notes (Signed)
Bleeding for 3 weeks (started 2/28 spotting, then got a little heavier, then heavy like prior to surgery, now spotting again) ?Had Sonata procedure and "it was doing good, I don't know what happened." ?

## 2021-05-09 NOTE — Progress Notes (Signed)
? ?  THERAPIST PROGRESS NOTE ? ?Session Time: 8:10am-9:00am ? ?Participation Level: Active ? ?Behavioral Response: Well GroomedAlertDepressed and Irritable ? ?Type of Therapy: Individual Therapy ? ?Treatment Goals addressed: "help me cope with my mood and all of the changes I am going through". [place] will experience fewer incidents of mood outbursts, anger, and frustration at home 4 out of 7 days, per self report. ? ?ProgressTowards Goals: Progressing ? ?Interventions: CBT ? ?Summary: BRENDAN GRUWELL is a 41 y.o. female who presents with Bipolar Affective Disorder, currently depressed, moderate.  ? ?Suicidal/Homicidal: Nowithout intent/plan ? ?Therapist Response: Rosella engaged well in individual session with clinician. Clinician utilized CBT to process recent events and her thoughts, feelings, and behaviors. Clinician validated array of feelings about niece, including being angry and frustrated, as well as sad and worried about her. Clinician identified the strengths of Jacklin and her niece, as well as their relationship. Clinician also utilized reality testing about the amount of control that Vickii actually has over niece, now that she is 30. Clinician discussed ways to improve communication and to provide support. Clinician also discussed options for continuing communication with niece's therapist. Clinician encouraged Jayelle to plan self-care activities, reduce external stresses by setting boundaries in unhealthy relationships, and to have some planned time away from others.  ? ?Plan: Return again in 1-2 weeks. ? ?Diagnosis: Bipolar affective disorder, currently depressed, moderate ([place]) ? ?Collaboration of Care: Other none required at this time. Clinician provided feedback about signing consent to niece's therapist to collaborate with the family.  ? ?Patient/Guardian was advised Release of Information must be obtained prior to any record release in order to collaborate their care with an outside provider.  Patient/Guardian was advised if they have not already done so to contact the registration department to sign all necessary forms in order for [place] to release information regarding their care.  ? ?Consent: Patient/Guardian gives verbal consent for treatment and assignment of benefits for services provided during this visit. Patient/Guardian expressed understanding and agreed to proceed.  ? ?[name], LCSW ?05/09/2021 ? ?

## 2021-05-16 ENCOUNTER — Ambulatory Visit (INDEPENDENT_AMBULATORY_CARE_PROVIDER_SITE_OTHER): Payer: Medicare Other | Admitting: Licensed Clinical Social Worker

## 2021-05-16 ENCOUNTER — Other Ambulatory Visit: Payer: Self-pay

## 2021-05-16 DIAGNOSIS — F3132 Bipolar disorder, current episode depressed, moderate: Secondary | ICD-10-CM | POA: Diagnosis not present

## 2021-05-18 ENCOUNTER — Ambulatory Visit
Admission: RE | Admit: 2021-05-18 | Discharge: 2021-05-18 | Disposition: A | Discharge status: Home / Self Care | Payer: Medicare Other | Source: Ambulatory Visit | Attending: Obstetrics and Gynecology | Admitting: Obstetrics and Gynecology

## 2021-05-18 ENCOUNTER — Encounter (HOSPITAL_COMMUNITY): Payer: Self-pay | Admitting: Licensed Clinical Social Worker

## 2021-05-18 DIAGNOSIS — N920 Excessive and frequent menstruation with regular cycle: Secondary | ICD-10-CM | POA: Insufficient documentation

## 2021-05-18 NOTE — Progress Notes (Signed)
? ?  THERAPIST PROGRESS NOTE ? ?Session Time: 9:00am-9:50am ? ?Participation Level: Active ? ?Behavioral Response: Well GroomedAlerttired ? ?Type of Therapy: Individual Therapy ? ?Treatment Goals addressed:  "help me cope with my mood and all of the changes I am going through". Bilan will experience fewer incidents of mood outbursts, anger, and frustration at home 4 out of 7 days, per self report. ? ?ProgressTowards Goals: Progressing ? ?Interventions: CBT ? ?Summary: Jordan [name] is a 41 y.o. female who presents with Bipolar Affective Disorder, currently depressed, moderate.  ? ?Suicidal/Homicidal: Nowithout intent/plan ? ?Therapist Response: Adream engaged well in individual session with clinician. Clinician utilized CBT to process interactions, moods, and behaviors with friends and family. Monteen shared updates on nieces and provided details about interactions with younger niece. Clinician noted the challenging role of being the aunt, the mother, the father, the friend, and the used by niece. Clinician discussed plans for the future, including the upcoming spring break week. Sumner shared that she is ready to return responsibility to the older sister. Clinician provided reality testing about her ongoing involvement with younger niece and noted that the bond has been created and it will not go anywhere. Clinician discussed the "curse of competence", noting that her high level of functioning has placed her in the role of "Active adult" in the lives of her nieces.  ? ?Plan: Return again in 1-2 weeks. ? ?Diagnosis: Bipolar affective disorder, currently depressed, moderate ([place]) ? ?Collaboration of Care: Other none required in this session ? ?Patient/Guardian was advised Release of Information must be obtained prior to any record release in order to collaborate their care with an outside provider. Patient/Guardian was advised if they have not already done so to contact the registration department to sign all  necessary forms in order for [place] to release information regarding their care.  ? ?Consent: Patient/Guardian gives verbal consent for treatment and assignment of benefits for services provided during this visit. Patient/Guardian expressed understanding and agreed to proceed.  ? ?[name], LCSW ?05/18/2021 ? ?

## 2021-05-22 DIAGNOSIS — M25562 Pain in left knee: Secondary | ICD-10-CM | POA: Diagnosis not present

## 2021-05-23 ENCOUNTER — Ambulatory Visit (INDEPENDENT_AMBULATORY_CARE_PROVIDER_SITE_OTHER): Payer: Medicare Other | Admitting: Licensed Clinical Social Worker

## 2021-05-23 ENCOUNTER — Encounter (HOSPITAL_COMMUNITY): Payer: Self-pay | Admitting: Licensed Clinical Social Worker

## 2021-05-23 DIAGNOSIS — F3132 Bipolar disorder, current episode depressed, moderate: Secondary | ICD-10-CM

## 2021-05-23 NOTE — Progress Notes (Signed)
? ?  THERAPIST PROGRESS NOTE ? ?Session Time: 8:00am-8:55am ? ?Participation Level: Active ? ?Behavioral Response: Well GroomedAlertIrritable ? ?Type of Therapy: Individual Therapy ? ?Treatment Goals addressed: "help me cope with my mood and all of the changes I am going through". Jordan [name] will experience fewer incidents of mood outbursts, anger, and frustration at home 4 out of 7 days, per self report. ? ?ProgressTowards Goals: Progressing ? ?Interventions: CBT ? ?Summary: Jordan [name] is a 41 y.o. female who presents with  Bipolar Affective Disorder, currently depressed, moderate ? ?Suicidal/Homicidal: Nowithout intent/plan ? ?Therapist Response: Zilphia engaged well in individual in person session with clinician. Clinician explored interactions at home with niece. Clinician utilized CBT to process Jordan [name]'s frustration and provided reality testing about niece's upbringing and her inability to maintain consistency. Clinician identified the differences between Jordan [name] and her niece, despite many similarities in experiences. Clinician discussed ways to challenge thoughts about niece and how to see her from an outsider's perspective. Clinician normalized Jordan [name]'s need for order and peace. Clinician also noted that time will continue to move forward and niece will graduate soon. Clinician reminded Jordan [name] why she allowed her to move in initially, which has been very important and successful for niece. Clinician encouraged Jordan [name] to focus on more positive outcomes and experiences, rather than getting stuck in the negativity.  ? ?Plan: Return again in  1-2 weeks. ? ?Diagnosis: Bipolar affective disorder, currently depressed, moderate ([place]) ? ?Collaboration of Care: Other none required in this session.  ? ?Patient/Guardian was advised Release of Information must be obtained prior to any record release in order to collaborate their care with an outside provider. Patient/Guardian was advised if they have not already done so to  contact the registration department to sign all necessary forms in order for [place] to release information regarding their care.  ? ?Consent: Patient/Guardian gives verbal consent for treatment and assignment of benefits for services provided during this visit. Patient/Guardian expressed understanding and agreed to proceed.  ? ?[name], LCSW ?05/23/2021 ? ?

## 2021-05-29 DIAGNOSIS — M25562 Pain in left knee: Secondary | ICD-10-CM | POA: Diagnosis not present

## 2021-05-30 ENCOUNTER — Ambulatory Visit (INDEPENDENT_AMBULATORY_CARE_PROVIDER_SITE_OTHER): Payer: Medicare Other | Admitting: Licensed Clinical Social Worker

## 2021-05-30 ENCOUNTER — Encounter (HOSPITAL_COMMUNITY): Payer: Self-pay | Admitting: Licensed Clinical Social Worker

## 2021-05-30 DIAGNOSIS — F3132 Bipolar disorder, current episode depressed, moderate: Secondary | ICD-10-CM

## 2021-05-30 NOTE — Progress Notes (Signed)
? ?  THERAPIST PROGRESS NOTE ? ?Session Time: 9:00am-9:50am ? ?Participation Level: Active ? ?Behavioral Response: Well GroomedAlertIrritable ? ?Type of Therapy: Individual Therapy ? ?Treatment Goals addressed:  "help me cope with my mood and all of the changes I am going through". Beola will experience fewer incidents of mood outbursts, anger, and frustration at home 4 out of 7 days, per self report. ? ?ProgressTowards Goals: Progressing ? ?Interventions: CBT ? ?Summary: Jordan [name] is a 41 y.o. female who presents with  Bipolar Affective Disorder, currently depressed, moderate  ? ?Suicidal/Homicidal: Nowithout intent/plan ? ?Therapist Response: Concha engaged well in in-person session with clinician. Clinician utilized CBT to process thoughts, feelings, and behaviors as [name] reviewed her week. Clinician discussed relationship with younger niece, who is a source of stress for [name]. Clinician provided feedback about ways to motivate niece to complete tasks. Clinician offered an incentive or reward system for niece, much like you would do for a younger child. Clinician provided psychoeducation about the impact of trauma on emotional development and noted that niece is working from a much younger age than her chronological age. Clinician also discussed possibility that niece does not really understand reasons why she did not come to [place] sooner. Clinician urged [name] to continue working with niece and get her prepared for adulthood. [name] shared that she is back in physical therapy and working out at [employer]. She is looking forward to regaining her strength and her health.  ? ?Plan: Return again in 1-2 weeks. ? ?Diagnosis: Bipolar affective disorder, currently depressed, moderate ([place]) ? ?Collaboration of Care: Other none required in this session.  ? ?Patient/Guardian was advised Release of Information must be obtained prior to any record release in order to collaborate their care with an outside provider.  Patient/Guardian was advised if they have not already done so to contact the registration department to sign all necessary forms in order for [place] to release information regarding their care.  ? ?Consent: Patient/Guardian gives verbal consent for treatment and assignment of benefits for services provided during this visit. Patient/Guardian expressed understanding and agreed to proceed.  ? ?[name], LCSW ?05/30/2021 ? ?

## 2021-05-31 ENCOUNTER — Ambulatory Visit (INDEPENDENT_AMBULATORY_CARE_PROVIDER_SITE_OTHER): Payer: Medicare Other | Admitting: Internal Medicine

## 2021-05-31 ENCOUNTER — Other Ambulatory Visit: Payer: Self-pay

## 2021-05-31 ENCOUNTER — Encounter: Payer: Self-pay | Admitting: Internal Medicine

## 2021-05-31 VITALS — BP 101/59 | HR 64 | Temp 98.2°F | Ht 69.0 in | Wt 266.4 lb

## 2021-05-31 DIAGNOSIS — I872 Venous insufficiency (chronic) (peripheral): Secondary | ICD-10-CM | POA: Diagnosis not present

## 2021-05-31 DIAGNOSIS — D170 Benign lipomatous neoplasm of skin and subcutaneous tissue of head, face and neck: Secondary | ICD-10-CM

## 2021-05-31 DIAGNOSIS — D250 Submucous leiomyoma of uterus: Secondary | ICD-10-CM

## 2021-05-31 DIAGNOSIS — Z6836 Body mass index (BMI) 36.0-36.9, adult: Secondary | ICD-10-CM

## 2021-05-31 DIAGNOSIS — E669 Obesity, unspecified: Secondary | ICD-10-CM

## 2021-05-31 DIAGNOSIS — Z0000 Encounter for general adult medical examination without abnormal findings: Secondary | ICD-10-CM

## 2021-05-31 DIAGNOSIS — H1010 Acute atopic conjunctivitis, unspecified eye: Secondary | ICD-10-CM | POA: Diagnosis not present

## 2021-05-31 DIAGNOSIS — F319 Bipolar disorder, unspecified: Secondary | ICD-10-CM | POA: Diagnosis not present

## 2021-05-31 DIAGNOSIS — M797 Fibromyalgia: Secondary | ICD-10-CM | POA: Diagnosis not present

## 2021-05-31 MED ORDER — PREGABALIN 150 MG PO CAPS: 150.0000 mg | ORAL_CAPSULE | Freq: Two times a day (BID) | ORAL | 3 refills | Status: DC

## 2021-05-31 NOTE — Assessment & Plan Note (Signed)
Patient has a history of uterine fibroids status post Sonata procedure by gynecology. She is currently taking megace 40 mg BID and reports regular cycles. She has uterine cramping but denies menorrhagia.  Her CBC and ferritin checked earlier this year were within normal limits. Continue to monitor.  ?

## 2021-05-31 NOTE — Patient Instructions (Signed)
Ms. Deford, ? ?It was a pleasure to see you today. I have sent refills of your lyrica to your pharmacy. Please follow up with me again in 1 year or sooner if you have any problems.  ? ?If you have any questions or concerns, call our clinic at [phone] or after hours call [phone] and ask for the internal medicine resident on call.  ? ?Thank you! ? ?Dr. Darnell Level  ?

## 2021-05-31 NOTE — Assessment & Plan Note (Signed)
Patient reports intermittent bilateral lower extremity swelling which is worse at the end of the day.  Swelling improves with rest and elevation.  She has no swelling on exam today.  No other associated symptoms.  Discussed likely diagnosis of venous insufficiency.  Advised elevation and compression. ?

## 2021-05-31 NOTE — Assessment & Plan Note (Signed)
Patient had a symptomatic lipoma of her neck that was surgically removed in January of this year.  It was found to be attached to the fascia of her muscle which was felt to be causing her symptoms.  She has done well since having this removed and denies any current pain. ? ? ?

## 2021-05-31 NOTE — Assessment & Plan Note (Signed)
Follows with psychiatry.  Well-controlled on Depakote 1000 mg daily. ?

## 2021-05-31 NOTE — Progress Notes (Signed)
?Subjective:  ? ?Patient ID: Jordan [name] female   DOB: 12-05-1980 41 y.o.   MRN: [id number] ? ?HPI: ?Jordan [name] is a 41 y.o. female with past medical history outlined below here for her annual check up and follow up of fibromyalgia. For the details of today's visit, please refer to the assessment and plan. ? ?Past Medical History:  ?Diagnosis Date  ? Alcohol abuse   ? Allergic rhinitis   ? pt takes flonase for episodes, no episodes in 2012  ? Anemia   ? Anxiety   ? Basal cell carcinoma   ? recurrent in left maxillary, has had 5 surguries, last one 2003  ? Basal cell nevus syndrome   ? dx age 51  ? Bipolar 1 disorder ([place])   ? BPPV (benign paroxysmal positional vertigo) 03/01/2016  ? Candidiasis, vagina   ? recurrent, pt has made lifestyle modifications and none recently (as of 8/12)  ? Cervicitis 03/19/2019  ? Cyst of nasal sinus   ? Depression   ? B-Polar  ? Fibroma   ? left ovarian  ? Fibromyalgia   ? Gardnerella infection   ? + in 08/08  ? GERD (gastroesophageal reflux disease)   ? occ  ? Hematoma, subungual, finger, right, initial encounter 01/23/2021  ? Herniated disc   ? Hyperlipidemia   ? Joint pain   ? Lactose intolerance   ? Obesity   ? Ovarian cyst 08/23/2020  ? OVARIAN CYSTECTOMY, HX OF 03/01/2006  ? ovarian fibroma-left 1996, L ovary and fallopian tube removed    ? Paronychia of third finger of left hand 07/28/2010  ? PLANTAR FASCIITIS, BILATERAL 05/24/2009  ? Pre-diabetes   ? PTSD (post-traumatic stress disorder)   ? [place]   ? hx of herpetic requiring I+D,( was bitten by autistic child that cares fr  ? ?Current Outpatient Medications  ?Medication Sig Dispense Refill  ? cetirizine-pseudoephedrine (ZYRTEC-D) 5-120 MG tablet Take 1 tablet by mouth daily. 30 tablet 0  ? diclofenac Sodium (VOLTAREN) 1 % GEL APPLY 4 G TOPICALLY 4 TIMES DAILY (Patient not taking: Reported on 05/09/2021) 200 g 2  ? divalproex (DEPAKOTE ER) 500 MG 24 hr tablet Take 2 tablets (1,000 mg total) by mouth daily. 60 tablet 2   ? fluticasone (FLONASE) 50 MCG/ACT nasal spray Place 1 spray into both nostrils daily. 16 g 2  ? hydrOXYzine (ATARAX) 10 MG tablet Take 1 tablet (10 mg total) by mouth daily as needed for anxiety. 30 tablet 2  ? megestrol (MEGACE) 40 MG tablet Take 1 tablet (40 mg total) by mouth 2 (two) times daily. Can increase to two tablets twice a day in the event of heavy bleeding 60 tablet 5  ? Multiple Vitamins-Minerals (ADULT ONE DAILY GUMMIES PO) Take 2 tablets by mouth daily.    ? pregabalin (LYRICA) 150 MG capsule Take 1 capsule (150 mg total) by mouth 2 (two) times daily. 180 capsule 3  ? ?No current facility-administered medications for this visit.  ? ?Family History  ?Problem Relation Age of Onset  ? Bipolar disorder Mother   ? Alcohol abuse Mother   ? Drug abuse Mother   ? Other Mother   ?     DDD  ? Hypertension Mother   ? Hyperlipidemia Mother   ? Depression Mother   ? Anxiety disorder Mother   ? Alcoholism Mother   ? Colon polyps Mother   ? Bipolar disorder Father   ? Other Father   ?  basal cell nevus syndrome  ? Depression Father   ? Drug abuse Sister   ? Colon polyps Sister   ? Drug abuse Brother   ? Other Brother   ?     basal cell syndrome  ? Breast cancer Maternal Aunt   ? Breast cancer Maternal Aunt   ? Diabetes Maternal Grandmother   ? Heart attack Maternal Grandmother   ? Cancer Maternal Grandmother   ?     stomach  ? Stomach cancer Other   ?     uncle  ? Cancer Other   ?     unknown grandfather  ? Diabetes Other   ?     grandmother, aunt and 2 uncles  ? Colon cancer Other   ?     cousin at age 35  ? ?Social History  ? ?Socioeconomic History  ? Marital status: Single  ?  Spouse name: Not on file  ? Number of children: 0  ? Years of education: 55  ? Highest education level: Some college, no degree  ?Occupational History  ? Occupation: disabled  ?  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 Smoking status: Never  ? Smokeless tobacco: Never  ?Vaping Use  ? Vaping Use: Never used  ?Substance and  Sexual Activity  ? Alcohol use: Yes  ?  Alcohol/week: 0.0 standard drinks  ?  Comment: socially   ? Drug use: No  ? Sexual activity: Yes  ?  Partners: Female  ?  Birth control/protection: None  ?Other Topics Concern  ? Not on file  ?Social History Narrative  ? Current Social History 09/15/20  ?   ? Patient lives alone in a townhome which is 2 stories. There are 4 steps up to the entrance the patient uses. There is a railing.  ?   ? Patient's method of transportation is personal car.  ?   ? The highest level of education was some college.  ?   ? The patient currently disabled but working part time   ?   ? Identified important Relationships are "2 nieces and my best friend"   ?   ? Pets : 1 dog, yorkie named Rosie  ?    ? Interests / Fun: "Umm I don't know I am trying to find some things"  ?   ? Current Stressors: "finances and trying to keep this weight off"   ?   ? Religious / Personal Beliefs: "Christianity"   ?   ? ?Social Determinants of Health  ? ?Financial Resource Strain: Not on file  ?Food Insecurity: Not on file  ?Transportation Needs: Not on file  ?Physical Activity: Not on file  ?Stress: Not on file  ?Social Connections: Not on file  ? ? ?Review of Systems: ?Review of Systems  ?Respiratory:  Negative for shortness of breath.   ?Cardiovascular:  Negative for chest pain.  ?Musculoskeletal:  Positive for joint pain.   ? ?Objective:  ?Physical Exam: ? ?Vitals:  ? 05/31/21 0930  ?BP: (!) 101/59  ?Pulse: 64  ?Temp: 98.2 ?F (36.8 ?C)  ?TempSrc: Oral  ?SpO2: 99%  ?Weight: 266 lb 6.4 oz (120.8 kg)  ?Height: '5\' 9"'$  (1.753 m)  ? ? ?Constitutional: NAD, well appearing  ?HEENT: Normal oropharynx, no goiter ?Cardiovascular: RRR, no m/r/g ?Pulmonary/Chest: Clear bilaterally, normal effort ?Extremities: Warm and well-perfused, no edema ?Psychiatric: Normal mood and affect ? ?Assessment & Plan:  ? ?Fibromyalgia ?Patient has a history of fibromyalgia and symptoms are  well controlled on Lyrica 150 mg twice daily.  She reports  migratory myofascial pain that comes and goes.  She is following with physical therapy and finds this is helpful.  She had labs checked 9 months ago with normal renal function.  Plan to continue with current dose of Lyrica sent 1 year refill [facility] her [facility].  Plan to recheck renal function at follow-up. ? ?Lipoma of skin and subcutaneous tissue of neck ?Patient had a symptomatic lipoma of her neck that was surgically removed in January of this year.  It was found to be attached to the fascia of her muscle which was felt to be causing her symptoms.  She has done well since having this removed and denies any current pain. ? ? ? ?Venous insufficiency ?Patient reports intermittent bilateral lower extremity swelling which is worse at the end of the day.  Swelling improves with rest and elevation.  She has no swelling on exam today.  No other associated symptoms.  Discussed likely diagnosis of venous insufficiency.  Advised elevation and compression. ? ?Seasonal allergic conjunctivitis ?Patient reports symptoms of conjunctivitis associated with the recent seasonal change and increase in pollen.  Symptoms have improved with over-the-counter allergy eyedrops and Zyrtec-D.  She is also prescribed hydroxyzine PRN by her psychiatrist for panic attacks.  If a daily antihistamine is needed, I recommended an antihistamine without a decongestant.  She is currently only using Zyrtec D infrequently and only PRN.  I also advised patient not to take cetirizine together with her hydroxyzine since these medications are in the same drug class.  Otherwise, okay to continue with Zyrtec-D PRN. ? ?Fibroids, submucosal ?Patient has a history of uterine fibroids status post Sonata procedure by gynecology. She is currently taking megace 40 mg BID and reports regular cycles. She has uterine cramping but denies menorrhagia.  Her CBC and ferritin checked earlier this year were within normal limits. Continue to monitor.  ? ?Bipolar disorder  ([place]) ?Follows with psychiatry.  Well-controlled on Depakote 1000 mg daily. ? ?Obesity ?Patient was previously prescribed Victoza however she was unable to fill this through her pharmacy during the recent

## 2021-05-31 NOTE — Assessment & Plan Note (Signed)
Patient reports symptoms of conjunctivitis associated with the recent seasonal change and increase in pollen.  Symptoms have improved with over-the-counter allergy eyedrops and Zyrtec-D.  She is also prescribed hydroxyzine PRN by her psychiatrist for panic attacks.  If a daily antihistamine is needed, I recommended an antihistamine without a decongestant.  She is currently only using Zyrtec D infrequently and only PRN.  I also advised patient not to take cetirizine together with her hydroxyzine since these medications are in the same drug class.  Otherwise, okay to continue with Zyrtec-D PRN. ?

## 2021-05-31 NOTE — Assessment & Plan Note (Signed)
Patient has a history of fibromyalgia and symptoms are well controlled on Lyrica 150 mg twice daily.  She reports migratory myofascial pain that comes and goes.  She is following with physical therapy and finds this is helpful.  She had labs checked 9 months ago with normal renal function.  Plan to continue with current dose of Lyrica sent 1 year refill to her [facility] pharmacy.  Plan to recheck renal function at follow-up. ?

## 2021-05-31 NOTE — Assessment & Plan Note (Signed)
Patient was previously prescribed Victoza however she was unable to fill this through her pharmacy during the recent shortage.  She is no longer interested in trying this but will continue with her current exercise regimen and physical therapy. ?

## 2021-06-05 DIAGNOSIS — M25562 Pain in left knee: Secondary | ICD-10-CM | POA: Diagnosis not present

## 2021-06-06 ENCOUNTER — Ambulatory Visit (INDEPENDENT_AMBULATORY_CARE_PROVIDER_SITE_OTHER): Payer: Medicare Other | Admitting: Licensed Clinical Social Worker

## 2021-06-06 ENCOUNTER — Encounter (HOSPITAL_COMMUNITY): Payer: Self-pay | Admitting: Licensed Clinical Social Worker

## 2021-06-06 DIAGNOSIS — F3132 Bipolar disorder, current episode depressed, moderate: Secondary | ICD-10-CM

## 2021-06-06 NOTE — Progress Notes (Signed)
? ?  THERAPIST PROGRESS NOTE ? ?Session Time: 9:00am-9:50am ? ?Participation Level: Active ? ?Behavioral Response: Well GroomedAlertDysphoric ? ?Type of Therapy: Individual Therapy ? ?Treatment Goals addressed:  "help me cope with my mood and all of the changes I am going through". Kecia will experience fewer incidents of mood outbursts, anger, and frustration at home 4 out of 7 days, per self report. ? ?ProgressTowards Goals: Progressing ? ?Interventions: CBT ? ?Summary: PRICILA BRIDGE is a 41 y.o. female who presents with Bipolar Affective Disorder, currently depressed, moderate  ? ?Suicidal/Homicidal: Nowithout intent/plan ? ?Therapist Response: Saprina engaged well in individual in person session with Clinician. Clinician utilized CBT to process interactions and time spent since last session. Araly shared a lot of joy and peace while she had her house to herself over the weekend. Clinician reflected the importance of finding and remembering the peace and comfort of her empty home while she had it. Khia reports she has felt less motivated to go out and do things because there are "too many people" everywhere. Clinician processed this experience and noted that while it requires extra effort to leave her nest, she feels fine when she is out. Clinician processed positive affirmations and motivating self talk to help her move more.  ? ?Plan: Return again in 1-2 weeks. ? ?Diagnosis: Bipolar affective disorder, currently depressed, moderate ([place]) ? ?Collaboration of Care: Other none required in this session ? ?Patient/Guardian was advised Release of Information must be obtained prior to any record release in order to collaborate their care with an outside provider. Patient/Guardian was advised if they have not already done so to contact the registration department to sign all necessary forms in order for [place] to release information regarding their care.  ? ?Consent: Patient/Guardian gives verbal consent for treatment  and assignment of benefits for services provided during this visit. Patient/Guardian expressed understanding and agreed to proceed.  ? ?[name], LCSW ?06/06/2021 ? ?

## 2021-06-13 ENCOUNTER — Ambulatory Visit (HOSPITAL_COMMUNITY): Payer: Medicare Other | Admitting: Licensed Clinical Social Worker

## 2021-06-13 ENCOUNTER — Ambulatory Visit: Payer: Medicare Other | Admitting: Obstetrics

## 2021-06-13 DIAGNOSIS — M25562 Pain in left knee: Secondary | ICD-10-CM | POA: Diagnosis not present

## 2021-06-14 ENCOUNTER — Ambulatory Visit: Payer: Medicare Other | Admitting: Obstetrics

## 2021-06-20 DIAGNOSIS — M25562 Pain in left knee: Secondary | ICD-10-CM | POA: Diagnosis not present

## 2021-06-22 ENCOUNTER — Encounter: Payer: Self-pay | Admitting: Obstetrics and Gynecology

## 2021-06-22 ENCOUNTER — Ambulatory Visit (INDEPENDENT_AMBULATORY_CARE_PROVIDER_SITE_OTHER): Payer: Medicare Other | Admitting: Obstetrics and Gynecology

## 2021-06-22 VITALS — BP 116/75 | HR 79 | Ht 69.0 in | Wt 270.3 lb

## 2021-06-22 DIAGNOSIS — N920 Excessive and frequent menstruation with regular cycle: Secondary | ICD-10-CM | POA: Diagnosis not present

## 2021-06-22 DIAGNOSIS — D250 Submucous leiomyoma of uterus: Secondary | ICD-10-CM

## 2021-06-22 NOTE — Progress Notes (Signed)
?  CC: menorrhagia follow up ?Subjective:  ? ? Patient ID: Jordan [name], female    DOB: 23-Jul-1980, 41 y.o.   MRN: [id number] ? ?HPI ?41 yo G0 seen for follow up of heavy vaginal bleeding 2/2 fibroids and s/p Sonata procedure 8/22.  Pt notes no menses as she is currently still using megace 40 mg daily.  No other side effects noted.  Discussed discontinuing the megace to see if there is any return of heavy bleeding.  Pt aware if bleeding returns we can consider restarting the megace or discuss possible hysterectomy. ? ? ?Review of Systems  ?Constitutional: Negative.   ?Genitourinary:  Negative for vaginal bleeding.  ? ?   ?Objective:  ? Physical Exam ?Vitals:  ? 06/22/21 1434  ?BP: 116/75  ?Pulse: 79  ? ? ? ? ?   ?Assessment & Plan:  ? ?1. Menorrhagia with regular cycle ?Discontinue megace.  Pt aware some bleeding is to be expected now that progesterone is discontinued.    If heavy menses returns, pt can restart megace or we can discuss definitive treatment with surgery. ?F/u in 4 months with virtual visit regardless of outcome. ? ?2. Fibroids, submucosal ? ?I spent 10 minutes dedicated to the care of this patient including previsit review of records, face to face time with the patient discussing treatment options, alternatives and post visit testing.  ? ? ? ?Griffin Basil, MD ?Faculty Attending, [facility]  ?

## 2021-06-26 DIAGNOSIS — M25562 Pain in left knee: Secondary | ICD-10-CM | POA: Diagnosis not present

## 2021-06-27 ENCOUNTER — Encounter (HOSPITAL_COMMUNITY): Payer: Self-pay | Admitting: Licensed Clinical Social Worker

## 2021-06-27 ENCOUNTER — Ambulatory Visit (INDEPENDENT_AMBULATORY_CARE_PROVIDER_SITE_OTHER): Payer: Medicare Other | Admitting: Licensed Clinical Social Worker

## 2021-06-27 DIAGNOSIS — F3132 Bipolar disorder, current episode depressed, moderate: Secondary | ICD-10-CM

## 2021-06-27 NOTE — Progress Notes (Signed)
? ?  THERAPIST PROGRESS NOTE ? ?Session Time: 8:00am-8:50am ? ?Participation Level: Active ? ?Behavioral Response: Well GroomedAlertIrritable ? ?Type of Therapy: Individual Therapy ? ?Treatment Goals addressed:   "help me cope with my mood and all of the changes I am going through". Cianna will experience fewer incidents of mood outbursts, anger, and frustration at home 4 out of 7 days, per self report. ? ?ProgressTowards Goals: Progressing ? ?Interventions: CBT ? ?Summary: MARCEL SORTER is a 41 y.o. female who presents with Bipolar Affective Disorder, currently depressed, moderate  ? ?Suicidal/Homicidal: Nowithout intent/plan ? ?Therapist Response: Tzipora engaged well in individual in person session with clinician. Clinician utilized CBT to process interactions at home with nieces and friends. Clinician discussed Baleigh's reactions to stress with her family. Clinician explored use of coping skills. Clinician processed the triggers of her family on her stress levels, noting the importance of boundaries. Clinician noted the differences in her ability to maintain those boundaries now versus one year ago. Clinician also pointed out increase in self-affirming language.  ? ?Plan: Return again in 1-2 weeks. ? ?Diagnosis: Bipolar affective disorder, currently depressed, moderate ([place]) ? ?Collaboration of Care: Other none required in this session.  ? ?Patient/Guardian was advised Release of Information must be obtained prior to any record release in order to collaborate their care with an outside provider. Patient/Guardian was advised if they have not already done so to contact the registration department to sign all necessary forms in order for [place] to release information regarding their care.  ? ?Consent: Patient/Guardian gives verbal consent for treatment and assignment of benefits for services provided during this visit. Patient/Guardian expressed understanding and agreed to proceed.  ? ?[name],  LCSW ?06/27/2021 ? ?

## 2021-07-03 DIAGNOSIS — M25562 Pain in left knee: Secondary | ICD-10-CM | POA: Diagnosis not present

## 2021-07-10 DIAGNOSIS — M25562 Pain in left knee: Secondary | ICD-10-CM | POA: Diagnosis not present

## 2021-07-11 ENCOUNTER — Ambulatory Visit (INDEPENDENT_AMBULATORY_CARE_PROVIDER_SITE_OTHER): Payer: Medicare Other | Admitting: Licensed Clinical Social Worker

## 2021-07-11 DIAGNOSIS — F3132 Bipolar disorder, current episode depressed, moderate: Secondary | ICD-10-CM

## 2021-07-13 ENCOUNTER — Encounter (HOSPITAL_COMMUNITY): Payer: Self-pay | Admitting: Licensed Clinical Social Worker

## 2021-07-13 NOTE — Progress Notes (Signed)
   THERAPIST PROGRESS NOTE  Session Time: 9:00am-9:45am  Participation Level: Active  Behavioral Response: Well GroomedAlertIrritable  Type of Therapy: Individual Therapy  Treatment Goals addressed: "help me cope with my mood and all of the changes I am going through". [name] will experience fewer incidents of mood outbursts, anger, and frustration at home 4 out of 7 days, per self report.  ProgressTowards Goals: Progressing  Interventions: CBT  Summary: [name] is a 41 y.o. female who presents with Bipolar affective disorder, currently depressed, moderate.   Suicidal/Homicidal: Nowithout intent/plan  Therapist Response: [name] engaged well in individual in person session. [name] shared updates about nieces and her relationships with friends and family. Clinician utilized CBT to process her ability to set, maintain and enforce those boundaries with her mother, her friends, and her nieces. Clinician noted increased strength and confidence in communicating those boundaries, saying no, and focusing on prioritizing self care. [name] reports she is going to the beach soon and she is looking forward to having time to relax and get away. Clinician explored updates with work and health. [name] shared improvement in pain levels due to physical therapy, which has helped her mood. Clinician reviewed coping skills for anger management.   Plan: Return again in 2 weeks.  Diagnosis: Bipolar affective disorder, currently depressed, moderate ([place])  Collaboration of Care: Other none required in this session  Patient/Guardian was advised Release of Information must be obtained prior to any record release in order to collaborate their care with an outside provider. Patient/Guardian was advised if they have not already done so to contact the registration department to sign all necessary forms in order for [place] to release information regarding their care.   Consent: Patient/Guardian gives verbal  consent for treatment and assignment of benefits for services provided during this visit. Patient/Guardian expressed understanding and agreed to proceed.   [place], LCSW 07/13/2021

## 2021-07-14 ENCOUNTER — Other Ambulatory Visit (HOSPITAL_COMMUNITY): Payer: Self-pay | Admitting: Psychiatry

## 2021-07-14 DIAGNOSIS — F3132 Bipolar disorder, current episode depressed, moderate: Secondary | ICD-10-CM

## 2021-07-14 DIAGNOSIS — F4310 Post-traumatic stress disorder, unspecified: Secondary | ICD-10-CM

## 2021-07-19 ENCOUNTER — Encounter (HOSPITAL_COMMUNITY): Payer: Self-pay | Admitting: Psychiatry

## 2021-07-19 ENCOUNTER — Telehealth (HOSPITAL_BASED_OUTPATIENT_CLINIC_OR_DEPARTMENT_OTHER): Payer: Medicare Other | Admitting: Psychiatry

## 2021-07-19 DIAGNOSIS — F4310 Post-traumatic stress disorder, unspecified: Secondary | ICD-10-CM | POA: Diagnosis not present

## 2021-07-19 DIAGNOSIS — F419 Anxiety disorder, unspecified: Secondary | ICD-10-CM | POA: Diagnosis not present

## 2021-07-19 DIAGNOSIS — F3132 Bipolar disorder, current episode depressed, moderate: Secondary | ICD-10-CM

## 2021-07-19 MED ORDER — HYDROXYZINE HCL 10 MG PO TABS: 10.0000 mg | ORAL_TABLET | Freq: Every day | ORAL | 1 refills | Status: DC | PRN

## 2021-07-19 MED ORDER — DIVALPROEX SODIUM ER 500 MG PO TB24: 1000.0000 mg | ORAL_TABLET | Freq: Every day | ORAL | 2 refills | Status: DC
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
## 2021-07-25 ENCOUNTER — Ambulatory Visit (INDEPENDENT_AMBULATORY_CARE_PROVIDER_SITE_OTHER): Payer: Medicare Other | Admitting: Licensed Clinical Social Worker

## 2021-07-25 DIAGNOSIS — F3132 Bipolar disorder, current episode depressed, moderate: Secondary | ICD-10-CM | POA: Diagnosis not present

## 2021-07-25 DIAGNOSIS — M25562 Pain in left knee: Secondary | ICD-10-CM | POA: Diagnosis not present

## 2021-07-25 DIAGNOSIS — Z5181 Encounter for therapeutic drug level monitoring: Secondary | ICD-10-CM | POA: Diagnosis not present

## 2021-07-26 ENCOUNTER — Telehealth (HOSPITAL_COMMUNITY): Payer: Self-pay | Admitting: *Deleted

## 2021-07-26 ENCOUNTER — Encounter (HOSPITAL_COMMUNITY): Payer: Self-pay | Admitting: Licensed Clinical Social Worker

## 2021-07-26 LAB — VALPROIC ACID LEVEL: Valproic Acid Lvl: 73 ug/mL (ref 50–100)

## 2021-07-26 NOTE — Progress Notes (Signed)
   THERAPIST PROGRESS NOTE  Session Time: 9:00am-9:50am  Participation Level: Active  Behavioral Response: Well GroomedAlertDepressed and Irritable  Type of Therapy: Individual Therapy  Treatment Goals addressed:  "help me cope with my mood and all of the changes I am going through". [name] will experience fewer incidents of mood outbursts, anger, and frustration at home 4 out of 7 days, per self report.  ProgressTowards Goals: Progressing  Interventions: CBT  Summary: Jordan [name] is a 41 y.o. female who presents with Bipolar affective disorder, currently depressed, moderate.   Suicidal/Homicidal: Nowithout intent/plan  Therapist Response: Jordan [name] engaged well in individual in person session with clinician. Clinician utilized CBT to process recent events in the family. Clinician assisted Jordan [name] and processing her experience with step-father's death, including sadness, grief, anger, and disappointment in her mother. Clinician discussed plans for memorial service and Jordan [name]'s grief process. Clinician challenged Jordan [name] to allow herself to grieve, cry, and rest. Clinician processed other updates in planning for the future. Clinician identified that this is now her opportunity to do more for herself, as niece is graduating this week and she will have more time to care for herself. Clinician processed Jordan [name]'s thoughts about returning to school and other options. She is connected with [facility] in mentorship and peer support.    Plan: Return again in 2 weeks.  Diagnosis: Bipolar affective disorder, currently depressed, moderate ([place])  Collaboration of Care: [employer]) AEB researched programs through [facility].   Patient/Guardian was advised Release of Information must be obtained prior to any record release in order to collaborate their care with an outside provider. Patient/Guardian was advised if they have not already done so to contact  the registration department to sign all necessary forms in order for [place] to release information regarding their care.   Consent: Patient/Guardian gives verbal consent for treatment and assignment of benefits for services provided during this visit. Patient/Guardian expressed understanding and agreed to proceed.   Jordan Marker [place], LCSW 07/26/2021

## 2021-07-26 NOTE — Telephone Encounter (Signed)
Valproic acid level results received from [facility] this morning. Resulted on 07/25/21.   VALPROIC ACID LEVEL 73 ug/ml

## 2021-08-01 ENCOUNTER — Ambulatory Visit (INDEPENDENT_AMBULATORY_CARE_PROVIDER_SITE_OTHER): Payer: Medicare Other | Admitting: Licensed Clinical Social Worker

## 2021-08-01 DIAGNOSIS — F3132 Bipolar disorder, current episode depressed, moderate: Secondary | ICD-10-CM

## 2021-08-01 DIAGNOSIS — M25562 Pain in left knee: Secondary | ICD-10-CM | POA: Diagnosis not present

## 2021-08-08 ENCOUNTER — Ambulatory Visit (INDEPENDENT_AMBULATORY_CARE_PROVIDER_SITE_OTHER): Payer: Medicare Other | Admitting: Licensed Clinical Social Worker

## 2021-08-08 ENCOUNTER — Encounter (HOSPITAL_COMMUNITY): Payer: Self-pay | Admitting: Licensed Clinical Social Worker

## 2021-08-08 DIAGNOSIS — F3132 Bipolar disorder, current episode depressed, moderate: Secondary | ICD-10-CM | POA: Diagnosis not present

## 2021-08-08 DIAGNOSIS — M25562 Pain in left knee: Secondary | ICD-10-CM | POA: Diagnosis not present

## 2021-08-08 NOTE — Addendum Note (Signed)
Addended by: Carlus Pavlov R on: 08/08/2021 03:11 PM   Modules accepted: Level of Service

## 2021-08-08 NOTE — Progress Notes (Signed)
   THERAPIST PROGRESS NOTE  Session Time: 9:00am-9:55am  Participation Level: Active  Behavioral Response: Well GroomedAlertIrritable  Type of Therapy: Individual Therapy  Treatment Goals addressed: "help me cope with my mood and all of the changes I am going through". Jordan [name] will experience fewer incidents of mood outbursts, anger, and frustration at home 4 out of 7 days, per self report.  ProgressTowards Goals: Progressing  Interventions: CBT  Summary: [name] is a 41 y.o. female who presents with Bipolar Affective Disorder, currently depressed, moderate.   Suicidal/Homicidal: Nowithout intent/plan  Therapist Response: Ronika engaged well in individual session with clinician. Clinician utilized CBT to process interactions and coping skills in dealing with her mother. Clinician identified ongoing challenges in dealing with her family and potential partners. Clinician explored moods and noted some depression manifesting as exhaustion. Charlena shared that she is back in the gym working out and spending more time with friends and extended family. Clinician also discussed the importance of taking control in areas of her life where she can, setting limits, and being able to make choices based on what she wants, not on the well-being of others.    Plan: Return again in 1-2 weeks.  Diagnosis: Bipolar affective disorder, currently depressed, moderate ([place])  Collaboration of Care: Other none required in this session  Patient/Guardian was advised Release of Information must be obtained prior to any record release in order to collaborate their care with an outside provider. Patient/Guardian was advised if they have not already done so to contact the registration department to sign all necessary forms in order for [place] to release information regarding their care.   Consent: Patient/Guardian gives verbal consent for treatment and assignment of benefits for services provided during this visit.  Patient/Guardian expressed understanding and agreed to proceed.   Jordan Marker [place], LCSW 08/08/2021

## 2021-08-08 NOTE — Progress Notes (Signed)
   THERAPIST PROGRESS NOTE  Session Time: 8:00am-8:45am  Participation Level: Active  Behavioral Response: Well GroomedAlertDepressed and Irritable  Type of Therapy: Individual Therapy  Treatment Goals addressed: "help me cope with my mood and all of the changes I am going through". Jordan [name] will experience fewer incidents of mood outbursts, anger, and frustration at home 4 out of 7 days, per self report.  ProgressTowards Goals: Progressing  Interventions: CBT  Summary: Jordan [name] is a 41 y.o. female who presents with Bipolar affective disorder, currently depressed, moderate  Suicidal/Homicidal: Nowithout intent/plan  Therapist Response: Individual session with [name]. Joyous engaged well. Clinician processed both the joy and grief over the past week, with the funeral of her step-father and the graduation of her niece. Clinician identified the challenges of blending these feelings and discussed coping skills. Clinician processed the incident with mother at funeral and noted the disappointment and realization that their relationship will not change, not because of [name]'s attempts, but because of mother's issues.   Plan: Return again in 1-2 weeks.  Diagnosis: Bipolar affective disorder, currently depressed, moderate ([place])  Collaboration of Care: Other none required in this session  Patient/Guardian was advised Release of Information must be obtained prior to any record release in order to collaborate their care with an outside provider. Patient/Guardian was advised if they have not already done so to contact the registration department to sign all necessary forms in order for [place] to release information regarding their care.   Consent: Patient/Guardian gives verbal consent for treatment and assignment of benefits for services provided during this visit. Patient/Guardian expressed understanding and agreed to proceed.   [name] [place], LCSW 08/08/2021

## 2021-08-10 ENCOUNTER — Other Ambulatory Visit (HOSPITAL_COMMUNITY)
Admission: RE | Admit: 2021-08-10 | Discharge: 2021-08-10 | Disposition: A | Discharge status: Home / Self Care | Payer: Medicare Other | Source: Ambulatory Visit | Attending: Internal Medicine | Admitting: Internal Medicine

## 2021-08-10 ENCOUNTER — Encounter: Payer: Self-pay | Admitting: Student

## 2021-08-10 ENCOUNTER — Ambulatory Visit (INDEPENDENT_AMBULATORY_CARE_PROVIDER_SITE_OTHER): Payer: Medicare Other | Admitting: Student

## 2021-08-10 VITALS — BP 97/57 | HR 63 | Temp 98.4°F | Wt 276.2 lb

## 2021-08-10 DIAGNOSIS — N898 Other specified noninflammatory disorders of vagina: Secondary | ICD-10-CM

## 2021-08-10 NOTE — Progress Notes (Unsigned)
Clear discharge for a week. No itching. NO odors no open sores. No recent sexual partners. Not concerned for std. Unclear No burning with Peeing.   Not currently sexually active.    Get bacterial vaginosis, recurrent episodes. Not as frequent as it used to occur.   Light bleeding one day and has not returned. Last week.   On megace. For HMB.

## 2021-08-10 NOTE — Patient Instructions (Signed)
We will test for bacterial vaginosis.   Please let us know if you have any other symptoms.

## 2021-08-11 LAB — CERVICOVAGINAL ANCILLARY ONLY
Bacterial Vaginitis (gardnerella): POSITIVE — AB
Candida Glabrata: NEGATIVE
Candida Vaginitis: NEGATIVE
Chlamydia: NEGATIVE
Comment: NEGATIVE
Comment: NEGATIVE
Comment: NEGATIVE
Comment: NEGATIVE
Comment: NEGATIVE
Comment: NORMAL
Neisseria Gonorrhea: NEGATIVE
Trichomonas: NEGATIVE

## 2021-08-11 LAB — HIV ANTIBODY (ROUTINE TESTING W REFLEX): HIV Screen 4th Generation wRfx: NONREACTIVE

## 2021-08-11 LAB — RPR: RPR Ser Ql: NONREACTIVE

## 2021-08-11 NOTE — Assessment & Plan Note (Signed)
Clear vaginal discharge for the past week. It is non malodorous. Patient denies any vaginal itching, erythema, or sores in the genital area. She also denies dysuria. She has had no new sexual partners since being treated for STIs in 03/2021.  -Will check HIV, RPR, and patient will self swab to check for BV/GC  Addendum: HIV/RPR negative

## 2021-08-14 ENCOUNTER — Telehealth: Payer: Self-pay | Admitting: Internal Medicine

## 2021-08-14 DIAGNOSIS — N898 Other specified noninflammatory disorders of vagina: Secondary | ICD-10-CM

## 2021-08-14 MED ORDER — METRONIDAZOLE 500 MG PO TABS: 500.0000 mg | ORAL_TABLET | Freq: Two times a day (BID) | ORAL | 0 refills | Status: AC

## 2021-08-15 ENCOUNTER — Encounter (HOSPITAL_COMMUNITY): Payer: Self-pay

## 2021-08-15 ENCOUNTER — Ambulatory Visit (HOSPITAL_COMMUNITY): Payer: Medicare Other | Admitting: Licensed Clinical Social Worker

## 2021-08-15 DIAGNOSIS — M25562 Pain in left knee: Secondary | ICD-10-CM | POA: Diagnosis not present

## 2021-08-29 ENCOUNTER — Ambulatory Visit (INDEPENDENT_AMBULATORY_CARE_PROVIDER_SITE_OTHER): Payer: Medicare Other | Admitting: Licensed Clinical Social Worker

## 2021-08-29 ENCOUNTER — Encounter (HOSPITAL_COMMUNITY): Payer: Self-pay | Admitting: Licensed Clinical Social Worker

## 2021-08-29 DIAGNOSIS — F3132 Bipolar disorder, current episode depressed, moderate: Secondary | ICD-10-CM

## 2021-08-29 NOTE — Progress Notes (Signed)
   THERAPIST PROGRESS NOTE  Session Time: 8:00am-8:50am  Participation Level: Active  Behavioral Response: Well GroomedAlertEuthymic  Type of Therapy: Individual Therapy  Treatment Goals addressed: "help me cope with my mood and all of the changes I am going through". [name] will experience fewer incidents of mood outbursts, anger, and frustration at home 4 out of 7 days, per self report.  ProgressTowards Goals: Progressing  Interventions: CBT  Summary: Jordan [name] is a 41 y.o. female who presents with Bipolar Affective Disorder, most recent episode depressed.   Suicidal/Homicidal: Nowithout intent/plan  Therapist Response: Tracey engaged well in [employer] with clinician. Clinician utilized CBT to process thoughts, feelings, and behaviors over the past few weeks. Clinician identified challenges in coping with her mother. Clinician processed decisions made regarding giving mother money and the outcomes of her decision. Clinician explored updates in new relationship with an old friend. Clinician noted differences in this connection, regarding [name]'s reactions to this person and her feelings of being "off her game". Clinician explored comfort with this person and noted the importance of moving slowly. [name] discussed interactions with her nieces and noted increased time on her own at home, as the girls have been at the older niece's apartment more frequently.   Plan: Return again in 1-2 weeks.  Diagnosis: Bipolar affective disorder, currently depressed, moderate ([place])  Collaboration of Care: Other none required in this session.  Patient/Guardian was advised Release of Information must be obtained prior to any record release in order to collaborate their care with an outside provider. Patient/Guardian was advised if they have not already done so to contact the registration department to sign all necessary forms in order for [place] to release information regarding their care.    Consent: Patient/Guardian gives verbal consent for treatment and assignment of benefits for services provided during this visit. Patient/Guardian expressed understanding and agreed to proceed.   [name] [place], LCSW 08/29/2021

## 2021-09-04 DIAGNOSIS — M25562 Pain in left knee: Secondary | ICD-10-CM | POA: Diagnosis not present

## 2021-09-05 ENCOUNTER — Ambulatory Visit (INDEPENDENT_AMBULATORY_CARE_PROVIDER_SITE_OTHER): Payer: Medicare Other | Admitting: Licensed Clinical Social Worker

## 2021-09-05 DIAGNOSIS — F3132 Bipolar disorder, current episode depressed, moderate: Secondary | ICD-10-CM

## 2021-09-06 ENCOUNTER — Encounter (HOSPITAL_COMMUNITY): Payer: Self-pay | Admitting: Licensed Clinical Social Worker

## 2021-09-06 NOTE — Progress Notes (Signed)
   THERAPIST PROGRESS NOTE  Session Time: 9:00am-9:50am  Participation Level: Active  Behavioral Response: Well GroomedAlertEuthymic  Type of Therapy: Individual Therapy  Treatment Goals addressed: "help me cope with my mood and all of the changes I am going through". Nohelani will experience fewer incidents of mood outbursts, anger, and frustration at home 4 out of 7 days, per self report.  ProgressTowards Goals: Progressing  Interventions: CBT  Summary: JAMIA HOBAN is a 41 y.o. female who presents with Bipolar Affective Disorder, most recent episode depressed.   Suicidal/Homicidal: Nowithout intent/plan  Therapist Response: Gracie engaged well in in-person session with clinician. Kendle shared updates on personal life and interactions with nieces, sister, and friends. Clinician utilized CBT to process through challenging interactions with others, discussing coping skills and ability to maintain her composure when others are less controlled. Clinician discussed relationships and plans for the future. Donzella shared concerns about younger niece, who demonstrates lack of maturity emotionally, but is in the position of becoming an adult. Clinician explored Cedricka's reactions and responses to these behaviors and processed outcomes when decisions are thoughtful vs impulsive.   Plan: Return again in 3 weeks.  Diagnosis: Bipolar affective disorder, currently depressed, moderate ([place])  Collaboration of Care: Other none required  Patient/Guardian was advised Release of Information must be obtained prior to any record release in order to collaborate their care with an outside provider. Patient/Guardian was advised if they have not already done so to contact the registration department to sign all necessary forms in order for [place] to release information regarding their care.   Consent: Patient/Guardian gives verbal consent for treatment and assignment of benefits for services provided during this  visit. Patient/Guardian expressed understanding and agreed to proceed.   [name] [place], LCSW 09/06/2021

## 2021-09-11 DIAGNOSIS — M25562 Pain in left knee: Secondary | ICD-10-CM | POA: Diagnosis not present

## 2021-09-18 DIAGNOSIS — M25562 Pain in left knee: Secondary | ICD-10-CM | POA: Diagnosis not present

## 2021-09-25 ENCOUNTER — Ambulatory Visit (INDEPENDENT_AMBULATORY_CARE_PROVIDER_SITE_OTHER): Payer: Medicare Other

## 2021-09-25 ENCOUNTER — Ambulatory Visit (INDEPENDENT_AMBULATORY_CARE_PROVIDER_SITE_OTHER): Payer: Medicare Other | Admitting: Podiatry

## 2021-09-25 DIAGNOSIS — M722 Plantar fascial fibromatosis: Secondary | ICD-10-CM

## 2021-09-25 MED ORDER — TRIAMCINOLONE ACETONIDE 10 MG/ML IJ SUSP
10.0000 mg | Freq: Once | INTRAMUSCULAR | Status: AC
  Administered 2021-09-25: 10 mg

## 2021-09-25 MED ORDER — DICLOFENAC SODIUM 75 MG PO TBEC: 75.0000 mg | DELAYED_RELEASE_TABLET | Freq: Two times a day (BID) | ORAL | 2 refills | Status: DC

## 2021-09-25 NOTE — Progress Notes (Signed)
Subjective:   Patient ID: Jordan [name], female   DOB: 41 y.o.   MRN: [id number]   HPI Patient presents stating that she was doing fine but over the last 2 weeks she has had a significant reoccurrence of pain in the right plantar fascia and its been hard to walk.  Does not remember specific injury   ROS      Objective:  Physical Exam  Neurovascular status intact with exquisite discomfort in the medial band of the right plantar fascia at the insertional point tendon into the calcaneus with flatfoot deformity noted     Assessment:  Acute Planter fasciitis which is reoccurred cannot rule out stress fracture as its been 9 months     Plan:  H&P x-ray reviewed sterile prep injected the plantar fascia right 3 mg Kenalog 5 mg Xylocaine applied fascial brace to try to lift up the arch and gave instructions on using her boot as best she can for the next couple weeks.  Reappoint to recheck  X-rays indicate significant depression of the arch spur formation no indication stress fracture arthritis

## 2021-09-26 ENCOUNTER — Other Ambulatory Visit: Payer: Self-pay | Admitting: Internal Medicine

## 2021-09-26 ENCOUNTER — Ambulatory Visit (INDEPENDENT_AMBULATORY_CARE_PROVIDER_SITE_OTHER): Payer: Medicare Other | Admitting: Licensed Clinical Social Worker

## 2021-09-26 ENCOUNTER — Ambulatory Visit
Admission: RE | Admit: 2021-09-26 | Discharge: 2021-09-26 | Disposition: A | Discharge status: Home / Self Care | Payer: Medicare Other | Source: Ambulatory Visit | Attending: Internal Medicine | Admitting: Internal Medicine

## 2021-09-26 DIAGNOSIS — N6310 Unspecified lump in the right breast, unspecified quadrant: Secondary | ICD-10-CM

## 2021-09-26 DIAGNOSIS — F3132 Bipolar disorder, current episode depressed, moderate: Secondary | ICD-10-CM

## 2021-09-26 DIAGNOSIS — N6315 Unspecified lump in the right breast, overlapping quadrants: Secondary | ICD-10-CM | POA: Diagnosis not present

## 2021-09-27 ENCOUNTER — Encounter (INDEPENDENT_AMBULATORY_CARE_PROVIDER_SITE_OTHER): Payer: Self-pay

## 2021-09-28 ENCOUNTER — Encounter (HOSPITAL_COMMUNITY): Payer: Self-pay | Admitting: Licensed Clinical Social Worker

## 2021-09-28 NOTE — Progress Notes (Signed)
   THERAPIST PROGRESS NOTE  Session Time: 3:30pm-4:30pm  Participation Level: Active  Behavioral Response: Well GroomedAlertEuthymic  Type of Therapy: Individual Therapy  Treatment Goals addressed: "help me cope with my mood and all of the changes I am going through". Leanore will experience fewer incidents of mood outbursts, anger, and frustration at home 4 out of 7 days, per self report.  ProgressTowards Goals: Progressing  Interventions: CBT  Summary: ANALEAH BRAME is a 41 y.o. female who presents with Bipolar Affective Disorder, most recent episode depressed   Suicidal/Homicidal: Nowithout intent/plan  Therapist Response: Silvia engaged well in individual in-person session. Clinician utilized CBT to process thoughts, feelings, and interactions with others since last session. Zairah shared excitement that niece would be moving into her dorm at [employer] this week. Clinician processed the plan to move her in and then come home to reclaim her home. Clinician explored interactions with sister, noting that sister is now allowed to be a part of her life. Clinician processed uneasy/guarded feelings about sister, but noted she is allowing her to come back in a slow and deliberate way. Clinician also processed new relationship, noting that things are going fairly well. Attie identified an incident where this person became triggered by [facility] health issues and showed out. Clinician processed this and explored ways that the situation was handled. Clinician noted the importance of being forthcoming about her health realities, to evoke kindness and caring from this new person, rather than trigger frustration.   Plan: Return again in 1-2 weeks.  Diagnosis: Bipolar affective disorder, currently depressed, moderate ([place])  Collaboration of Care: Other none required in this session  Patient/Guardian was advised Release of Information must be obtained prior to any record release in order to collaborate  their care with an outside provider. Patient/Guardian was advised if they have not already done so to contact the registration department to sign all necessary forms in order for [place] to release information regarding their care.   Consent: Patient/Guardian gives verbal consent for treatment and assignment of benefits for services provided during this visit. Patient/Guardian expressed understanding and agreed to proceed.   [name] [place], LCSW 09/28/2021

## 2021-10-02 DIAGNOSIS — M25562 Pain in left knee: Secondary | ICD-10-CM | POA: Diagnosis not present

## 2021-10-05 ENCOUNTER — Ambulatory Visit: Payer: Medicare Other | Admitting: Podiatry

## 2021-10-09 ENCOUNTER — Encounter: Payer: Self-pay | Admitting: Podiatry

## 2021-10-09 ENCOUNTER — Ambulatory Visit (INDEPENDENT_AMBULATORY_CARE_PROVIDER_SITE_OTHER): Payer: Medicare Other | Admitting: Podiatry

## 2021-10-09 DIAGNOSIS — M722 Plantar fascial fibromatosis: Secondary | ICD-10-CM

## 2021-10-09 DIAGNOSIS — M25562 Pain in left knee: Secondary | ICD-10-CM | POA: Diagnosis not present

## 2021-10-09 MED ORDER — TRIAMCINOLONE ACETONIDE 10 MG/ML IJ SUSP
10.0000 mg | Freq: Once | INTRAMUSCULAR | Status: AC
  Administered 2021-10-09: 10 mg

## 2021-10-09 NOTE — Progress Notes (Signed)
Subjective:   Patient ID: Jordan [name], female   DOB: 41 y.o.   MRN: [id number]   HPI Patient states I am doing better but I have 1 spot that still aggravating me.  States overall she is pleased but does still have pain   ROS      Objective:  Physical Exam  Neuro vascular status intact with discomfort still in the plantar fascia with fluid buildup around the medial band     Assessment:  Continuation Planter fasciitis with some improvement but an area still that is quite tender     Plan:  Reviewed condition went ahead today and for the right plantar fascia I reinjected the insertional point 3 mg Kenalog 5 mg Xylocaine and I advised on anti-inflammatories physical therapy and reappoint to recheck

## 2021-10-10 ENCOUNTER — Encounter (HOSPITAL_COMMUNITY): Payer: Self-pay | Admitting: Licensed Clinical Social Worker

## 2021-10-10 ENCOUNTER — Ambulatory Visit (INDEPENDENT_AMBULATORY_CARE_PROVIDER_SITE_OTHER): Payer: Medicare Other | Admitting: Licensed Clinical Social Worker

## 2021-10-10 DIAGNOSIS — F3132 Bipolar disorder, current episode depressed, moderate: Secondary | ICD-10-CM
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
## 2021-10-17 ENCOUNTER — Other Ambulatory Visit (HOSPITAL_COMMUNITY): Payer: Self-pay | Admitting: Psychiatry

## 2021-10-17 ENCOUNTER — Ambulatory Visit (INDEPENDENT_AMBULATORY_CARE_PROVIDER_SITE_OTHER): Payer: Medicare Other | Admitting: Licensed Clinical Social Worker

## 2021-10-17 DIAGNOSIS — F3132 Bipolar disorder, current episode depressed, moderate: Secondary | ICD-10-CM

## 2021-10-17 DIAGNOSIS — F4310 Post-traumatic stress disorder, unspecified: Secondary | ICD-10-CM

## 2021-10-18 ENCOUNTER — Encounter (HOSPITAL_COMMUNITY): Payer: Self-pay | Admitting: Psychiatry

## 2021-10-18 ENCOUNTER — Telehealth (HOSPITAL_BASED_OUTPATIENT_CLINIC_OR_DEPARTMENT_OTHER): Payer: Medicare Other | Admitting: Psychiatry

## 2021-10-18 VITALS — Wt 276.0 lb

## 2021-10-18 DIAGNOSIS — F3132 Bipolar disorder, current episode depressed, moderate: Secondary | ICD-10-CM | POA: Diagnosis not present

## 2021-10-18 DIAGNOSIS — F419 Anxiety disorder, unspecified: Secondary | ICD-10-CM

## 2021-10-18 DIAGNOSIS — F4310 Post-traumatic stress disorder, unspecified: Secondary | ICD-10-CM

## 2021-10-18 MED ORDER — DIVALPROEX SODIUM ER 500 MG PO TB24: 1000.0000 mg | ORAL_TABLET | Freq: Every day | ORAL | 2 refills | Status: DC
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
## 2021-10-19 ENCOUNTER — Encounter (HOSPITAL_COMMUNITY): Payer: Self-pay | Admitting: Licensed Clinical Social Worker
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
## 2021-10-24 ENCOUNTER — Ambulatory Visit (HOSPITAL_COMMUNITY): Payer: Medicare Other | Admitting: Licensed Clinical Social Worker

## 2021-10-24 ENCOUNTER — Encounter (HOSPITAL_COMMUNITY): Payer: Self-pay

## 2021-10-24 DIAGNOSIS — M25562 Pain in left knee: Secondary | ICD-10-CM | POA: Diagnosis not present

## 2021-10-31 ENCOUNTER — Encounter (HOSPITAL_COMMUNITY): Payer: Self-pay | Admitting: Licensed Clinical Social Worker

## 2021-10-31 ENCOUNTER — Ambulatory Visit (INDEPENDENT_AMBULATORY_CARE_PROVIDER_SITE_OTHER): Payer: Medicare Other | Admitting: Licensed Clinical Social Worker

## 2021-10-31 DIAGNOSIS — F3132 Bipolar disorder, current episode depressed, moderate: Secondary | ICD-10-CM | POA: Diagnosis not present

## 2021-10-31 NOTE — Progress Notes (Signed)
Virtual Visit via Video Note  I connected with Jordan [name] on 10/31/21 at  8:00 AM EDT by a video enabled telemedicine application and verified that I am speaking with the correct person using two identifiers.  Location: Patient: car ( in community, not driving) Provider: home office   I discussed the limitations of evaluation and management by telemedicine and the availability of in person appointments. The patient expressed understanding and agreed to proceed.     I discussed the assessment and treatment plan with the patient. The patient was provided an opportunity to ask questions and all were answered. The patient agreed with the plan and demonstrated an understanding of the instructions.   The patient was advised to call back or seek an in-person evaluation if the symptoms worsen or if the condition fails to improve as anticipated.  I provided 45 minutes of non-face-to-face time during this encounter.   [name], LCSW   THERAPIST PROGRESS NOTE  Session Time: 8:00am-8:45am  Participation Level: Active  Behavioral Response: Well GroomedAlertAnxious and Depressed  Type of Therapy: Individual Therapy  Treatment Goals addressed: "help me cope with my mood and all of the changes I am going through". Jordan [name] will experience fewer incidents of mood outbursts, anger, and frustration at home 4 out of 7 days, per self report.  ProgressTowards Goals: Progressing  Interventions: CBT  Summary: Jordan [name] is a 41 y.o. female who presents with Bipolar affective disorder, currently depressed, moderate.   Suicidal/Homicidal: Nowithout intent/plan  Therapist Response: Jordan [name] engaged well in individual virtual session. Clinician explored interactions and stressors since last session. Jordan [name] shared updates with family, partner, and her dog. Clinician processed coping skills and offered mindful options for setting boundaries, making helpful choices for herself, and  communicating better with her partner. Clinician noted that putting in the work now to improve her communication and boundaries will help her greatly in the future. Clinician noted that there are many financial struggles at this time, which will likely be addressed soon. Jordan [name] is also in process of cleaning up her house after the bedbug treatment.   Plan: Return again in 2 weeks.  Diagnosis: Bipolar affective disorder, currently depressed, moderate ([place])  Collaboration of Care: Other none required  in this session  Patient/Guardian was advised Release of Information must be obtained prior to any record release in order to collaborate their care with an outside provider. Patient/Guardian was advised if they have not already done so to contact the registration department to sign all necessary forms in order for [place] to release information regarding their care.   Consent: Patient/Guardian gives verbal consent for treatment and assignment of benefits for services provided during this visit. Patient/Guardian expressed understanding and agreed to proceed.   [name] [place], LCSW 10/31/2021

## 2021-11-01 DIAGNOSIS — M25562 Pain in left knee: Secondary | ICD-10-CM | POA: Diagnosis not present

## 2021-11-06 DIAGNOSIS — M25562 Pain in left knee: Secondary | ICD-10-CM | POA: Diagnosis not present

## 2021-11-07 ENCOUNTER — Encounter (HOSPITAL_COMMUNITY): Payer: Self-pay | Admitting: Licensed Clinical Social Worker

## 2021-11-07 ENCOUNTER — Ambulatory Visit (INDEPENDENT_AMBULATORY_CARE_PROVIDER_SITE_OTHER): Payer: Medicare Other | Admitting: Licensed Clinical Social Worker

## 2021-11-07 DIAGNOSIS — F3132 Bipolar disorder, current episode depressed, moderate: Secondary | ICD-10-CM | POA: Diagnosis not present
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
## 2021-11-08 ENCOUNTER — Other Ambulatory Visit: Payer: Self-pay | Admitting: Obstetrics and Gynecology

## 2021-11-08 DIAGNOSIS — N920 Excessive and frequent menstruation with regular cycle: Secondary | ICD-10-CM

## 2021-11-13 DIAGNOSIS — M25562 Pain in left knee: Secondary | ICD-10-CM | POA: Diagnosis not present

## 2021-11-14 ENCOUNTER — Ambulatory Visit (INDEPENDENT_AMBULATORY_CARE_PROVIDER_SITE_OTHER): Payer: Medicare Other | Admitting: Licensed Clinical Social Worker

## 2021-11-14 DIAGNOSIS — F3132 Bipolar disorder, current episode depressed, moderate: Secondary | ICD-10-CM

## 2021-11-16 ENCOUNTER — Ambulatory Visit (HOSPITAL_COMMUNITY): Payer: Medicare Other | Admitting: Licensed Clinical Social Worker

## 2021-11-16 ENCOUNTER — Encounter (HOSPITAL_COMMUNITY): Payer: Self-pay | Admitting: Licensed Clinical Social Worker

## 2021-11-16 NOTE — Progress Notes (Signed)
Virtual Visit via Telephone Note  I connected with Jordan [name] on 11/16/21 at  9:00 AM EDT by telephone and verified that I am speaking with the correct person using two identifiers.  Location: Patient: veterinary clinic Provider: office   I discussed the limitations, risks, security and privacy concerns of performing an evaluation and management service by telephone and the availability of in person appointments. I also discussed with the patient that there may be a patient responsible charge related to this service. The patient expressed understanding and agreed to proceed.     I discussed the assessment and treatment plan with the patient. The patient was provided an opportunity to ask questions and all were answered. The patient agreed with the plan and demonstrated an understanding of the instructions.   The patient was advised to call back or seek an in-person evaluation if the symptoms worsen or if the condition fails to improve as anticipated.  I provided 45 minutes of non-face-to-face time during this encounter.   Jordan Curling, LCSW   THERAPIST PROGRESS NOTE  Session Time: 9:00am-9:45am  Participation Level: Active  Behavioral Response: NAAlertDepressed  Type of Therapy: Individual Therapy  Treatment Goals addressed: "help me cope with my mood and all of the changes I am going through". Jordan [name] will experience fewer incidents of mood outbursts, anger, and frustration at home 4 out of 7 days, per self report.  ProgressTowards Goals: Progressing  Interventions: Motivational Interviewing and Supportive  Summary: Jordan [name] is a 41 y.o. female who presents with Bipolar Affective Disorder, currently depressed.   Suicidal/Homicidal: Nowithout intent/plan  Therapist Response: Jordan [name] engaged well in session. She shared that today was the day she had to euthanize her dog. Clinician processed thoughts, feelings, and decision making process using MI OARS.  Clinician identified that Jordan [name] was in the vet's office and was in process of taking the dog to be put down. Clinician reflected the importance of this decision, the challenges and gifts of being the one to make the choice. Clinician discussed the joys and pains of having a pet. Clinician also reflected the gift she gave her dog by being such a good dog mommy and giving her an amazing life. Clinician provided supportive therapy and provided time and space for Jordan [name] to grieve. Clinician also encouraged Jordan [name] to take the day off work and to cry as much as she needs to, tell stories, look at photos, videos, and spend time with her support system.   Plan: Return again in 1 weeks.  Diagnosis: Bipolar affective disorder, currently depressed, moderate ([place])  Collaboration of Care: Psychiatrist AEB provided update to Dr. Adele [name]  Patient/Guardian was advised Release of Information must be obtained prior to any record release in order to collaborate their care with an outside provider. Patient/Guardian was advised if they have not already done so to contact the registration department to sign all necessary forms in order for [place] to release information regarding their care.   Consent: Patient/Guardian gives verbal consent for treatment and assignment of benefits for services provided during this visit. Patient/Guardian expressed understanding and agreed to proceed.   Jordan Marker [place], LCSW 11/16/2021

## 2021-11-20 DIAGNOSIS — M25562 Pain in left knee: Secondary | ICD-10-CM | POA: Diagnosis not present

## 2021-11-21 ENCOUNTER — Ambulatory Visit (INDEPENDENT_AMBULATORY_CARE_PROVIDER_SITE_OTHER): Payer: Medicare Other | Admitting: Licensed Clinical Social Worker

## 2021-11-21 DIAGNOSIS — F3132 Bipolar disorder, current episode depressed, moderate: Secondary | ICD-10-CM | POA: Diagnosis not present

## 2021-11-22 ENCOUNTER — Encounter (HOSPITAL_COMMUNITY): Payer: Self-pay | Admitting: Licensed Clinical Social Worker

## 2021-11-22 NOTE — Progress Notes (Signed)
   THERAPIST PROGRESS NOTE  Session Time: 8:20am-9:00am  Participation Level: Active  Behavioral Response: Well GroomedAlertDepressed  Type of Therapy: Individual Therapy  Treatment Goals addressed:  "help me cope with my mood and all of the changes I am going through". Delcie will experience fewer incidents of mood outbursts, anger, and frustration at home 4 out of 7 days, per self report.  ProgressTowards Goals: Progressing  Interventions: CBT  Summary: MIKAYLA CHIUSANO is a 41 y.o. female who presents with Bipolar Affective Disorder, currently depressed, moderate.   Suicidal/Homicidal: Nowithout intent/plan  Therapist Response: Kanesha engaged well in individual session with clinician. Clinician utilized CBT to process thoughts, feelings, and behaviors over the past week. Clinician processed grief with loss of dog last week. Clinician identified finding support with new partner, noting that Jazell is still staying with new partner several nights per week. Clinician explored the pros and cons of the relationship, noting some significant concerns from [name] about the partner's jealousy, lack of trust, and controlling nature. Clinician explored Nhyira's take on this and noted that partner will likely not participate in her own counseling, due to past negative experience. Clinician offered options for couples counseling. Deira shared updates on nieces and identified some improvement in their interactions.    Plan: Return again in 1-2 weeks.  Diagnosis: Bipolar affective disorder, currently depressed, moderate ([place])  Collaboration of Care: Other none required in this session  Patient/Guardian was advised Release of Information must be obtained prior to any record release in order to collaborate their care with an outside provider. Patient/Guardian was advised if they have not already done so to contact the registration department to sign all necessary forms in order for [place] to release  information regarding their care.   Consent: Patient/Guardian gives verbal consent for treatment and assignment of benefits for services provided during this visit. Patient/Guardian expressed understanding and agreed to proceed.   [place], LCSW 11/22/2021

## 2021-11-28 ENCOUNTER — Ambulatory Visit (INDEPENDENT_AMBULATORY_CARE_PROVIDER_SITE_OTHER): Payer: Medicare Other | Admitting: Licensed Clinical Social Worker

## 2021-11-28 DIAGNOSIS — F3132 Bipolar disorder, current episode depressed, moderate: Secondary | ICD-10-CM | POA: Diagnosis not present

## 2021-11-29 ENCOUNTER — Ambulatory Visit (INDEPENDENT_AMBULATORY_CARE_PROVIDER_SITE_OTHER): Payer: Medicare Other | Admitting: Internal Medicine

## 2021-11-29 ENCOUNTER — Encounter: Payer: Self-pay | Admitting: Internal Medicine

## 2021-11-29 ENCOUNTER — Other Ambulatory Visit: Payer: Self-pay

## 2021-11-29 ENCOUNTER — Encounter (HOSPITAL_COMMUNITY): Payer: Self-pay | Admitting: Licensed Clinical Social Worker

## 2021-11-29 VITALS — BP 99/62 | HR 72 | Temp 98.6°F | Ht 69.0 in | Wt 277.6 lb

## 2021-11-29 DIAGNOSIS — M797 Fibromyalgia: Secondary | ICD-10-CM | POA: Diagnosis not present

## 2021-11-29 DIAGNOSIS — J45909 Unspecified asthma, uncomplicated: Secondary | ICD-10-CM | POA: Insufficient documentation

## 2021-11-29 DIAGNOSIS — R058 Other specified cough: Secondary | ICD-10-CM | POA: Diagnosis not present

## 2021-11-29 MED ORDER — FLUTICASONE PROPIONATE 50 MCG/ACT NA SUSP
1.0000 | Freq: Every day | NASAL | 2 refills | Status: DC
Start: 2021-11-29 — End: 2023-07-08

## 2021-11-29 MED ORDER — LORATADINE 10 MG PO TABS: 10.0000 mg | ORAL_TABLET | Freq: Every day | ORAL | 2 refills | Status: DC

## 2021-11-29 NOTE — Progress Notes (Signed)
Subjective:   Patient ID: Jordan [name] female   DOB: March 28, 1980 41 y.o.   MRN: [id number]  HPI: [name] is a 41 y.o. female with past medical history outlined below here for cough and body aches. For the details of today's visit, please refer to the assessment and plan.  Past Medical History:  Diagnosis Date   Alcohol abuse    Allergic rhinitis    pt takes flonase for episodes, no episodes in 2012   Anemia    Anxiety    Basal cell carcinoma    recurrent in left maxillary, has had 5 surguries, last one 2003   Basal cell nevus syndrome    dx age 1   Bipolar 1 disorder ([place])    BPPV (benign paroxysmal positional vertigo) 03/01/2016   Candidiasis, vagina    recurrent, pt has made lifestyle modifications and none recently (as of 8/12)   Cervicitis 03/19/2019   Cyst of nasal sinus    Depression    B-Polar   Fibroma    left ovarian   Fibromyalgia    Gardnerella infection    + in 08/08   GERD (gastroesophageal reflux disease)    occ   Hematoma, subungual, finger, right, initial encounter 01/23/2021   Herniated disc    Hyperlipidemia    Joint pain    Lactose intolerance    Obesity    Ovarian cyst 08/23/2020   OVARIAN CYSTECTOMY, HX OF 03/01/2006   ovarian fibroma-left 1996, L ovary and fallopian tube removed     Paronychia of third finger of left hand 07/28/2010   PLANTAR FASCIITIS, BILATERAL 05/24/2009   Pre-diabetes    PTSD (post-traumatic stress disorder)    [place]    hx of herpetic requiring I+D,( was bitten by autistic child that cares fr   Current Outpatient Medications  Medication Sig Dispense Refill   loratadine (CLARITIN) 10 MG tablet Take 1 tablet (10 mg total) by mouth daily. 30 tablet 2   diclofenac (VOLTAREN) 75 MG EC tablet Take 1 tablet (75 mg total) by mouth 2 (two) times daily. 50 tablet 2   diclofenac Sodium (VOLTAREN) 1 % GEL APPLY 4 G TOPICALLY 4 TIMES DAILY 200 g 2   divalproex (DEPAKOTE ER) 500 MG 24 hr tablet Take 2 tablets (1,000 mg  total) by mouth daily. 60 tablet 2   fluticasone (FLONASE) 50 MCG/ACT nasal spray Place 1 spray into both nostrils daily. 16 g 2   hydrOXYzine (ATARAX) 10 MG tablet Take 1 tablet (10 mg total) by mouth daily as needed for anxiety. 30 tablet 1   megestrol (MEGACE) 40 MG tablet TAKE 1 TABLET BY MOUTH 2 TIMES DAILY. CAN INCREASE TO 2 TABLETS BY MOUTH TWICE A DAY IN THE EVENT OF HEAVY BLEEDING 180 tablet 1   Multiple Vitamins-Minerals (ADULT ONE DAILY GUMMIES PO) Take 2 tablets by mouth daily.     pregabalin (LYRICA) 150 MG capsule Take 1 capsule (150 mg total) by mouth 2 (two) times daily. 180 capsule 3   No current facility-administered medications for this visit.   Family History  Problem Relation Age of Onset   Bipolar disorder Mother    Alcohol abuse Mother    Drug abuse Mother    Other Mother        DDD   Hypertension Mother    Hyperlipidemia Mother    Depression Mother    Anxiety disorder Mother    Alcoholism Mother    Colon polyps Mother    Bipolar disorder  Father    Other Father        basal cell nevus syndrome   Depression Father    Drug abuse Sister    Colon polyps Sister    Drug abuse Brother    Other Brother        basal cell syndrome   Breast cancer Maternal Aunt    Breast cancer Maternal Aunt    Diabetes Maternal Grandmother    Heart attack Maternal Grandmother    Cancer Maternal Grandmother        stomach   Stomach cancer Other        uncle   Cancer Other        unknown grandfather   Diabetes Other        grandmother, aunt and 2 uncles   Colon cancer Other        cousin at age 52   Social History   Socioeconomic History   Marital status: Single    Spouse name: Not on file   Number of children: 0   Years of education: 13   Highest education level: Some college, no degree  Occupational History   Occupation: disabled    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 There are 4 steps up to the entrance the patient uses. There is a railing.      Patient's method of transportation is personal car.      The highest level of education was some college.      The patient currently disabled but working part time       Identified important Relationships are "2 nieces and my best friend"       Pets : 1 dog, yorkie named Tax inspector / Fun: "Umm I don't know I am trying to find some things"      Current Stressors: "finances and trying to keep this weight off"       Religious / Personal Beliefs: "Christianity"       Social Determinants of Health   Financial Resource Strain: Not on file  Food Insecurity: Not on file  Transportation Needs: Not on file  Physical Activity: Not on file  Stress: Not on file  Social Connections: Not on file    Objective:  Physical Exam:  Vitals:   11/29/21 1014  BP: 99/62  Pulse: 72  Temp: 98.6 F (37 C)  TempSrc: Oral  SpO2: 100%  Weight: 277 lb 9.6 oz (125.9 kg)  Height: '5\' 9"'$  (1.753 m)    Constitutional: Well appearing, NAD Cardiovascular: RRR, no m/r/g Pulmonary/Chest: Clear bilaterally, dry cough, no wheezing, normal effort Extremities: warm, mild non pitting LE edema    Assessment & Plan:   Post-viral cough syndrome Patient is here with complaint of cough, body aches, and chest pain which has been ongoing now for 2 weeks.  She has had a couple recent sick contacts,  one with the flu and one with COVID-19.  She is up-to-date with her COVID-19 vaccine series and is considered low risk for severe disease.  She does  receive her flu shot annually, but has not yet received it this year.  She has a constant dry cough which is very bothersome to her.  On exam her lungs are clear.  She is well-appearing, vitals are stable.  Discussed with patient that this is likely a postviral cough syndrome which can take weeks to improve.  Recommended conservative management with intranasal Flonase and Claritin.  She also takes Atarax as needed for anxiety, instructed her not to take this in addition to the Claritin (one of the other).  If cough does not improve, I have asked patient to reach out to me via MyChart.  Could consider inhaled glucocorticoid if not improving.  We also discussed testing with PCR swab, however given that she is 2 weeks out from symptom onset this would likely not change our management.  She will return for a nurse only visit when she is feeling better to receive her flu shot.  Fibromyalgia Patient has fibromyalgia and takes Lyrica 150 mg daily and participates in regular physical therapy.  Her pain symptoms have flared over the past couple weeks during her acute viral illness.  She has also had some life stressors including the passing of her dog and a bedbug infestation which has been very expensive for her to eradicate.  I offered to increase her Lyrica to 200 mg however she would like to give herself more time and continue her current dose.  Follow-up as needed.

## 2021-11-29 NOTE — Assessment & Plan Note (Signed)
Patient is here with complaint of cough, body aches, and chest pain which has been ongoing now for 2 weeks.  She has had a couple recent sick contacts, one with the flu and one with COVID-19.  She is up-to-date with her COVID-19 vaccine series and is considered low risk for severe disease.  She does receive her flu shot annually, but has not yet received it this year.  She has a constant dry cough which is very bothersome to her.  On exam her lungs are clear.  She is well-appearing, vitals are stable.  Discussed with patient that this is likely a postviral cough syndrome which can take weeks to improve.  Recommended conservative management with intranasal Flonase and Claritin.  She also takes Atarax as needed for anxiety, instructed her not to take this in addition to the Claritin (one of the other).  If cough does not improve, I have asked patient to reach out to me via MyChart.  Could consider inhaled glucocorticoid if not improving.  We also discussed testing with PCR swab, however given that she is 2 weeks out from symptom onset this would likely not change our management.  She will return for a nurse only visit when she is feeling better to receive her flu shot.

## 2021-11-29 NOTE — Patient Instructions (Signed)
Jordan [name],  It was a pleasure to see you today. I am sorry to hear you have been sick. You likely had COVID-19 or influenza. Your cough symptoms might take a few weeks to get better. Please start using flonase, 2 sprays each nostril daily, and claritin. Do not take claritin with your hydroxyzine. If your cough does not improve, please send me a mychart message.   Follow up with me again in 6 months. You can schedule a nurse only visit to come back and get your flu shot when you are feeling better.   If you have any questions or concerns, call our clinic at [phone] or after hours call [phone] and ask for the internal medicine resident on call.   Thank you!  Dr. Darnell Level

## 2021-11-29 NOTE — Progress Notes (Signed)
   THERAPIST PROGRESS NOTE  Session Time: 8:00am-8:50am  Participation Level: Active  Behavioral Response: NeatAlertDepressed  Type of Therapy: Individual Therapy  Treatment Goals addressed: "help me cope with my mood and all of the changes I am going through". Kerianna will experience fewer incidents of mood outbursts, anger, and frustration at home 4 out of 7 days, per self report.  ProgressTowards Goals: Progressing  Interventions: CBT  Summary: CHASLYN EISEN is a 41 y.o. female who presents with Bipolar Affective Disorder, currently depressed, moderate.   Suicidal/Homicidal: Nowithout intent/plan  Therapist Response: Delorese engaged well in individual session with clinician. Clinician utlized CBT to process thoughts, feelings and interactions with friends, coworkers, and family. Clinician processed updates in relationship with partner. Clinician explored pros and cons of the relationship, as well as an update about partner's willingness to participate in counseling. Clinician discussed Lucynda's mood and noted ongoing unease at being displaced from her home. However, Anupama shared that she has befriended partner's dog, which has been a huge comfort following the recent death of Mariann's dog. Clinician discussed coping skills, her ability to communicate her needs, and her ability to deal with partner's issues of poor self-confidence and anxiety/PTSD from past relationship.   Plan: Return again in 1-2 weeks.  Diagnosis: Bipolar affective disorder, currently depressed, moderate ([place])  Collaboration of Care: Psychiatrist AEB provided update to Dr. Adele Schilder  Patient/Guardian was advised Release of Information must be obtained prior to any record release in order to collaborate their care with an outside provider. Patient/Guardian was advised if they have not already done so to contact the registration department to sign all necessary forms in order for [place] to release information regarding  their care.   Consent: Patient/Guardian gives verbal consent for treatment and assignment of benefits for services provided during this visit. Patient/Guardian expressed understanding and agreed to proceed.   [name] [place], LCSW 11/29/2021

## 2021-11-29 NOTE — Assessment & Plan Note (Signed)
Patient has fibromyalgia and takes Lyrica 150 mg daily and participates in regular physical therapy.  Her pain symptoms have flared over the past couple weeks during her acute viral illness.  She has also had some life stressors including the passing of her dog and a bedbug infestation which has been very expensive for her to eradicate.  I offered to increase her Lyrica to 200 mg however she would like to give herself more time and continue her current dose.  Follow-up as needed.

## 2021-12-04 DIAGNOSIS — M25562 Pain in left knee: Secondary | ICD-10-CM | POA: Diagnosis not present

## 2021-12-04 DIAGNOSIS — H5213 Myopia, bilateral: Secondary | ICD-10-CM | POA: Diagnosis not present

## 2021-12-05 ENCOUNTER — Ambulatory Visit (INDEPENDENT_AMBULATORY_CARE_PROVIDER_SITE_OTHER): Payer: Medicare Other | Admitting: Licensed Clinical Social Worker

## 2021-12-05 ENCOUNTER — Encounter (HOSPITAL_COMMUNITY): Payer: Self-pay | Admitting: Licensed Clinical Social Worker

## 2021-12-05 ENCOUNTER — Other Ambulatory Visit (HOSPITAL_COMMUNITY): Payer: Self-pay | Admitting: Psychiatry

## 2021-12-05 ENCOUNTER — Encounter: Payer: Self-pay | Admitting: Internal Medicine

## 2021-12-05 DIAGNOSIS — F3132 Bipolar disorder, current episode depressed, moderate: Secondary | ICD-10-CM | POA: Diagnosis not present

## 2021-12-05 DIAGNOSIS — F419 Anxiety disorder, unspecified: Secondary | ICD-10-CM

## 2021-12-05 DIAGNOSIS — F4310 Post-traumatic stress disorder, unspecified: Secondary | ICD-10-CM

## 2021-12-05 DIAGNOSIS — R058 Other specified cough: Secondary | ICD-10-CM

## 2021-12-05 MED ORDER — QVAR REDIHALER 40 MCG/ACT IN AERB: 2.0000 | INHALATION_SPRAY | Freq: Two times a day (BID) | RESPIRATORY_TRACT | 0 refills | Status: DC

## 2021-12-05 NOTE — Progress Notes (Signed)
   THERAPIST PROGRESS NOTE  Session Time: 8:00am-8:50am  Participation Level: Active  Behavioral Response: Well GroomedAlertDepressed and Irritable  Type of Therapy: Individual Therapy  Treatment Goals addressed:  "help me cope with my mood and all of the changes I am going through". Tylea will experience fewer incidents of mood outbursts, anger, and frustration at home 4 out of 7 days, per self report.    ProgressTowards Goals: Progressing  Interventions: CBT  Summary: JORGIA MANTHEI is a 41 y.o. female who presents with Bipolar Affective Disorder, currently depressed, moderate.   Suicidal/Homicidal: Nowithout intent/plan  Therapist Response: [place] engaged well in individual in person session. Clinician utilized CBT to process thoughts, feelings, and interactions since last session. Clinician assisted Ardenia in processing recent issues in relationship with partner. Clinician explored the attachment and concerns about partner's insecurities in the relationship. Clinician explored Amarilis's response to these issues in the relationship and noted the importance of maintaining her composure when partner becomes unstable. Clinician processed updates with housing, finances, and work stressors. Clinician also explored coping skills utilized to keep her on track.   Plan: Return again in 1 weeks.  Diagnosis: Bipolar affective disorder, currently depressed, moderate ([place])  Collaboration of Care: Medication Management AEB encouraged Sansa to review meds with Dr. Adele Schilder at next session to make sure everything is at the correct level.   Patient/Guardian was advised Release of Information must be obtained prior to any record release in order to collaborate their care with an outside provider. Patient/Guardian was advised if they have not already done so to contact the registration department to sign all necessary forms in order for [place] to release information regarding their care.   Consent:  Patient/Guardian gives verbal consent for treatment and assignment of benefits for services provided during this visit. Patient/Guardian expressed understanding and agreed to proceed.   [name] [place], LCSW 12/05/2021

## 2021-12-06 ENCOUNTER — Other Ambulatory Visit: Payer: Self-pay | Admitting: Podiatry

## 2021-12-11 DIAGNOSIS — M25562 Pain in left knee: Secondary | ICD-10-CM | POA: Diagnosis not present

## 2021-12-12 ENCOUNTER — Ambulatory Visit (INDEPENDENT_AMBULATORY_CARE_PROVIDER_SITE_OTHER): Payer: Medicare Other | Admitting: Licensed Clinical Social Worker

## 2021-12-12 ENCOUNTER — Encounter (HOSPITAL_COMMUNITY): Payer: Self-pay | Admitting: Licensed Clinical Social Worker

## 2021-12-12 DIAGNOSIS — F3132 Bipolar disorder, current episode depressed, moderate: Secondary | ICD-10-CM

## 2021-12-12 NOTE — Progress Notes (Signed)
   THERAPIST PROGRESS NOTE  Session Time: 8:00am-8:50am  Participation Level: Active  Behavioral Response: Well GroomedAlertEuthymic  Type of Therapy: Individual Therapy  Treatment Goals addressed: "help me cope with my mood and all of the changes I am going through". Camaya will experience fewer incidents of mood outbursts, anger, and frustration at home 4 out of 7 days, per self report  ProgressTowards Goals: Progressing  Interventions: CBT  Summary: Jordan [name] is a 41 y.o. female who presents with Bipolar Affective Disorder, recently depressed .   Suicidal/Homicidal: Nowithout intent/plan  Therapist Response: [place] engaged well in individual session with clinician. Clinician utilized CBT to process thoughts, feelings, and interactions with friends, family, and Medical laboratory scientific officer. Clinician processed updates about relationship with partner and noted that things continue to go well, although trust is still building. Clinician discussed Anisa's reaction to some of partner's issues, particularly the mistrust due to past relationships. Clinician reflected increased control over temper and increased patience and understanding for partner. Kanija processed her relationship with sister and nieces, noting that there are improvements in their relationship, but Valentina also shares mistrust with sister, based on past issues. Clinician reflected the strong value of family, which has kept her as close with her mother, sister, and nieces for so long, despite the stress and drama.   Plan: Return again in 1-2 weeks.  Diagnosis: Bipolar affective disorder, currently depressed, moderate ([place])  Collaboration of Care: Psychiatrist AEB received updates about medication issue from last week. Lizza was able to locate her medication and is back on track with daily dose.   Patient/Guardian was advised Release of Information must be obtained prior to any record release in order to collaborate their care with an  outside provider. Patient/Guardian was advised if they have not already done so to contact the registration department to sign all necessary forms in order for [place] to release information regarding their care.   Consent: Patient/Guardian gives verbal consent for treatment and assignment of benefits for services provided during this visit. Patient/Guardian expressed understanding and agreed to proceed.   [name] [place], LCSW 12/12/2021

## 2021-12-18 DIAGNOSIS — M25562 Pain in left knee: Secondary | ICD-10-CM | POA: Diagnosis not present

## 2021-12-19 ENCOUNTER — Encounter (HOSPITAL_COMMUNITY): Payer: Self-pay | Admitting: Licensed Clinical Social Worker

## 2021-12-19 ENCOUNTER — Ambulatory Visit (INDEPENDENT_AMBULATORY_CARE_PROVIDER_SITE_OTHER): Payer: Medicare Other | Admitting: Licensed Clinical Social Worker

## 2021-12-19 DIAGNOSIS — F3132 Bipolar disorder, current episode depressed, moderate: Secondary | ICD-10-CM

## 2021-12-19 NOTE — Progress Notes (Signed)
   THERAPIST PROGRESS NOTE  Session Time: 8:00am-8:50am  Participation Level: Active  Behavioral Response: CasualAlertIrritable  Type of Therapy: Individual Therapy  Treatment Goals addressed: "help me cope with my mood and all of the changes I am going through". Jordan [name] will experience fewer incidents of mood outbursts, anger, and frustration at home 4 out of 7 days, per self report    ProgressTowards Goals: Progressing  Interventions: CBT  Summary: Jordan [name] is a 41 y.o. female who presents with Bipolar Affective Disorder, recently depressed.   Suicidal/Homicidal: Nowithout intent/plan  Therapist Response: Missey engaged well in individual in person session. Clinician utilized CBT to process thoughts and feelings about recent interactions with people at work and family. Clinician validated and explored concerns about her interactions with nieces. Clinician processed [name]'s responsbility to them, as they are 52 and 42 years old. Clinician noted [name]'s financial concerns and explored their willingness to be supportive. Clinician normalized frustration with their opinions about her relationship, but no solutions presented or willingness to help.  Clinician explored [name]'s coping skills and her ability to cope with stress. [name] shared that she will be returning to her home in November, in attempt to return to her routine.   Plan: Return again in 1-2 weeks.  Diagnosis: Bipolar affective disorder, currently depressed, moderate ([place])  Collaboration of Care: Psychiatrist AEB provided updates to Dr. Adele Schilder  Patient/Guardian was advised Release of Information must be obtained prior to any record release in order to collaborate their care with an outside provider. Patient/Guardian was advised if they have not already done so to contact the registration department to sign all necessary forms in order for [place] to release information regarding their care.   Consent: Patient/Guardian  gives verbal consent for treatment and assignment of benefits for services provided during this visit. Patient/Guardian expressed understanding and agreed to proceed.   [name] [place], LCSW 12/19/2021

## 2021-12-22 DIAGNOSIS — H5203 Hypermetropia, bilateral: Secondary | ICD-10-CM | POA: Diagnosis not present

## 2021-12-25 DIAGNOSIS — M25562 Pain in left knee: Secondary | ICD-10-CM | POA: Diagnosis not present

## 2021-12-27 ENCOUNTER — Other Ambulatory Visit: Payer: Self-pay

## 2021-12-27 ENCOUNTER — Other Ambulatory Visit (HOSPITAL_COMMUNITY)
Admission: RE | Admit: 2021-12-27 | Discharge: 2021-12-27 | Disposition: A | Discharge status: Home / Self Care | Payer: Medicare Other | Source: Ambulatory Visit | Attending: Internal Medicine | Admitting: Internal Medicine

## 2021-12-27 ENCOUNTER — Encounter: Payer: Self-pay | Admitting: Internal Medicine

## 2021-12-27 ENCOUNTER — Ambulatory Visit (INDEPENDENT_AMBULATORY_CARE_PROVIDER_SITE_OTHER): Payer: Medicare Other | Admitting: Licensed Clinical Social Worker

## 2021-12-27 ENCOUNTER — Ambulatory Visit (INDEPENDENT_AMBULATORY_CARE_PROVIDER_SITE_OTHER): Payer: Medicare Other | Admitting: Internal Medicine

## 2021-12-27 VITALS — BP 102/63 | HR 70 | Temp 98.3°F | Ht 69.0 in | Wt 277.6 lb

## 2021-12-27 DIAGNOSIS — F3132 Bipolar disorder, current episode depressed, moderate: Secondary | ICD-10-CM

## 2021-12-27 DIAGNOSIS — Z1151 Encounter for screening for human papillomavirus (HPV): Secondary | ICD-10-CM | POA: Diagnosis not present

## 2021-12-27 DIAGNOSIS — R590 Localized enlarged lymph nodes: Secondary | ICD-10-CM | POA: Diagnosis not present

## 2021-12-27 DIAGNOSIS — Z23 Encounter for immunization: Secondary | ICD-10-CM

## 2021-12-27 DIAGNOSIS — Z113 Encounter for screening for infections with a predominantly sexual mode of transmission: Secondary | ICD-10-CM | POA: Insufficient documentation

## 2021-12-27 DIAGNOSIS — Z01419 Encounter for gynecological examination (general) (routine) without abnormal findings: Secondary | ICD-10-CM | POA: Diagnosis present

## 2021-12-27 DIAGNOSIS — Z124 Encounter for screening for malignant neoplasm of cervix: Secondary | ICD-10-CM | POA: Insufficient documentation

## 2021-12-27 NOTE — Assessment & Plan Note (Signed)
Patient has a palpable mass in her right groin which I suspect is a reactive lymph node. <2cm, mobile, and tender to palpation. Subsequent pelvic exam with some evidence of cervicitis. Sent for GC / Chlamydia, trich, BV, and candida testing. Will treat accordingly. If no infection identified, instructed her to follow up in a few weeks if lymph node swelling persists.

## 2021-12-27 NOTE — Assessment & Plan Note (Signed)
Sexually active, uses barrier protection. Denies vaginal discharge however has some evidence of cervicitis on pelvic exam with a reactive right inguinal lymph node. F/u wet prep testing.

## 2021-12-27 NOTE — Assessment & Plan Note (Signed)
PAP smear collected, sent for HPV co testing.

## 2021-12-27 NOTE — Patient Instructions (Signed)
Ms. Budzinski,  It was a pleasure to see you. I will call you with the results of your testing today. If your groin swelling / pain does not improve over the next few weeks, please return to see me again. Otherwise I will see you again in 6 months.   If you have any questions or concerns, call our clinic at [phone] or after hours call [phone] and ask for the internal medicine resident on call.
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
## 2021-12-28 LAB — CERVICOVAGINAL ANCILLARY ONLY
Bacterial Vaginitis (gardnerella): NEGATIVE
Candida Glabrata: NEGATIVE
Candida Vaginitis: NEGATIVE
Chlamydia: NEGATIVE
Comment: NEGATIVE
Comment: NEGATIVE
Comment: NEGATIVE
Comment: NEGATIVE
Comment: NEGATIVE
Comment: NORMAL
Neisseria Gonorrhea: NEGATIVE
Trichomonas: NEGATIVE

## 2021-12-29 LAB — CYTOLOGY - PAP
Comment: NEGATIVE
Diagnosis: NEGATIVE
High risk HPV: NEGATIVE

## 2022-01-01 ENCOUNTER — Encounter (HOSPITAL_COMMUNITY): Payer: Self-pay | Admitting: Licensed Clinical Social Worker

## 2022-01-01 DIAGNOSIS — M25562 Pain in left knee: Secondary | ICD-10-CM | POA: Diagnosis not present

## 2022-01-01 NOTE — Progress Notes (Signed)
Virtual Visit via Video Note  I connected with Jordan [name] on 01/01/22 at  9:00 AM EST by a video enabled telemedicine application and verified that I am speaking with the correct person using two identifiers.  Location: Patient: home Provider: home office   I discussed the limitations of evaluation and management by telemedicine and the availability of in person appointments. The patient expressed understanding and agreed to proceed.   I discussed the assessment and treatment plan with the patient. The patient was provided an opportunity to ask questions and all were answered. The patient agreed with the plan and demonstrated an understanding of the instructions.   The patient was advised to call back or seek an in-person evaluation if the symptoms worsen or if the condition fails to improve as anticipated.  I provided 45 minutes of non-face-to-face time during this encounter.   [name], LCSW   THERAPIST PROGRESS NOTE  Session Time: 9:00am-9:45am  Participation Level: Active  Behavioral Response: Well GroomedAlertDepressed  Type of Therapy: Individual Therapy  Treatment Goals addressed:  "help me cope with my mood and all of the changes I am going through". Jordan [name] will experience fewer incidents of mood outbursts, anger, and frustration at home 4 out of 7 days, per self report   ProgressTowards Goals: Progressing  Interventions: CBT  Summary: Jordan [name] is a 41 y.o. female who presents with Bipolar affective disorder, depressed, moderate.   Suicidal/Homicidal: Nowithout intent/plan  Therapist Response: Jordan [name] engaged well in individual session with clinician. Clinician utilized CBT to process thoughts and feelings about housing, relationship, and financial strain. Clinician noted some possible changes occurring in her relationship, which will lead to more financial concerns. Clinician explored feelings about possibly changing her relationship with current  partner, noting that this person has many issues that have been unresolved from past relationship. Jordan [name] shared that she does not take responsibility for this due to her willingness to be open, honest, and upfront about her wants, needs, and expectations. Clinician processed frustration in relationship with nieces, noting that they do not come to her with normal issues, just really big problems. Jordan [name] also shared that nieces are not there for her when she needs them. Clinician challenged Jordan [name] to ask for help, rather than assume that they do not want to help.   Plan: Return again in 1-2 weeks.  Diagnosis: Bipolar affective disorder, currently depressed, moderate ([place])  Collaboration of Care: Other none required  Patient/Guardian was advised Release of Information must be obtained prior to any record release in order to collaborate their care with an outside provider. Patient/Guardian was advised if they have not already done so to contact the registration department to sign all necessary forms in order for [place] to release information regarding their care.   Consent: Patient/Guardian gives verbal consent for treatment and assignment of benefits for services provided during this visit. Patient/Guardian expressed understanding and agreed to proceed.   [name] [place], LCSW 01/01/2022

## 2022-01-02 ENCOUNTER — Ambulatory Visit (INDEPENDENT_AMBULATORY_CARE_PROVIDER_SITE_OTHER): Payer: Medicare Other | Admitting: Licensed Clinical Social Worker

## 2022-01-02 ENCOUNTER — Other Ambulatory Visit: Payer: Self-pay | Admitting: Internal Medicine

## 2022-01-02 ENCOUNTER — Other Ambulatory Visit (HOSPITAL_COMMUNITY): Payer: Self-pay | Admitting: Psychiatry

## 2022-01-02 DIAGNOSIS — F3132 Bipolar disorder, current episode depressed, moderate: Secondary | ICD-10-CM

## 2022-01-02 DIAGNOSIS — F419 Anxiety disorder, unspecified: Secondary | ICD-10-CM

## 2022-01-02 DIAGNOSIS — M797 Fibromyalgia: Secondary | ICD-10-CM

## 2022-01-02 DIAGNOSIS — F4310 Post-traumatic stress disorder, unspecified: Secondary | ICD-10-CM

## 2022-01-03 NOTE — Progress Notes (Signed)
   THERAPIST PROGRESS NOTE  Session Time: 8:00am-8:45am  Participation Level: Active  Behavioral Response: Well GroomedAlertDepressed  Type of Therapy: Individual Therapy  Treatment Goals addressed: "help me cope with my mood and all of the changes I am going through". Jordan [name] will experience fewer incidents of mood outbursts, anger, and frustration at home 4 out of 7 days, per self report    ProgressTowards Goals: Progressing  Interventions: CBT  Summary: Jordan [name] is a 41 y.o. female who presents with Bipolar Affective disorder, currently depressed, moderate.   Suicidal/Homicidal: Nowithout intent/plan  Therapist Response: Gabriana engaged well in individual session with clinician. Clinician processed life events since last session. Clinician utilized CBT to process thoughts and feelings about interactions with partner. [name] processed some sad feelings and frustration about partner continuing to waiver about whether or not they should be an official couple. Clinician Discussed pros and cons about committing to this woman. Clinician also explored the value of [name] being a support and cheerleader for partner to get herself healthy. Clinician discussed [name]'s feelings about being back in her own home. [name] shared that she is happy to be home, even though she has thrown out her furniture due to bedbugs. Clinician will research agencies with furniture assistance. Clinician discussed coping skills through self-care.    Plan: Return again in 1-2 weeks.  Diagnosis: Bipolar affective disorder, currently depressed, moderate ([name])  Collaboration of Care: Community Stakeholder(s) AEB clinician will research options for community supports for furniture  Patient/Guardian was advised Release of Information must be obtained prior to any record release in order to collaborate their care with an outside provider. Patient/Guardian was advised if they have not already done so to contact the  registration department to sign all necessary forms in order for [place] to release information regarding their care.   Consent: Patient/Guardian gives verbal consent for treatment and assignment of benefits for services provided during this visit. Patient/Guardian expressed understanding and agreed to proceed.   [name] [place], LCSW 01/03/2022

## 2022-01-04 ENCOUNTER — Encounter: Payer: Self-pay | Admitting: Internal Medicine

## 2022-01-05 ENCOUNTER — Other Ambulatory Visit: Payer: Self-pay | Admitting: *Deleted

## 2022-01-05 DIAGNOSIS — M797 Fibromyalgia: Secondary | ICD-10-CM

## 2022-01-05 MED ORDER — PREGABALIN 150 MG PO CAPS: 150.0000 mg | ORAL_CAPSULE | Freq: Two times a day (BID) | ORAL | 3 refills | Status: DC

## 2022-01-09 ENCOUNTER — Ambulatory Visit (INDEPENDENT_AMBULATORY_CARE_PROVIDER_SITE_OTHER): Payer: Medicare Other | Admitting: Licensed Clinical Social Worker

## 2022-01-09 DIAGNOSIS — M25562 Pain in left knee: Secondary | ICD-10-CM | POA: Diagnosis not present

## 2022-01-09 DIAGNOSIS — F3132 Bipolar disorder, current episode depressed, moderate: Secondary | ICD-10-CM | POA: Diagnosis not present

## 2022-01-10 ENCOUNTER — Encounter (HOSPITAL_COMMUNITY): Payer: Self-pay | Admitting: Licensed Clinical Social Worker

## 2022-01-10 NOTE — Progress Notes (Signed)
   THERAPIST PROGRESS NOTE  Session Time: 9:00am-9:45am  Participation Level: Active  Behavioral Response: Well GroomedAlertEuthymic  Type of Therapy: Individual Therapy  Treatment Goals addressed: "help me cope with my mood and all of the changes I am going through". Jordan [name] will experience fewer incidents of mood outbursts, anger, and frustration at home 4 out of 7 days, per self report     ProgressTowards Goals: Progressing  Interventions: CBT  Summary: Jordan PIGGOTT is a 41 y.o. female who presents with Bipolar Affective Disorder, currently depressed, moderate.   Suicidal/Homicidal: Nowithout intent/plan  Therapist Response: [name] engaged well in session with clinician. Clinician utilized CBT to process thoughts, feelings, and recent interactions with friends and family. Clinician explored updates about family relationships, including with her sister and nieces. [name] shared that while she does not trust her sister, she feels bad for her and has agreed to include her in the Thanksgiving festivities. Clinician explored content of their relationship and noted that there has been more socializing and sharing of fun. However, [name] shared that she still feels uncomfortable due to history. Clinician explored updates with "partner". [name] reported things are going well and they are not committed, but have been more open with feelings. [name] identified the importance of her own healing journey and noted that partner has not yet started healing from past trauma.   Plan: Return again in 1-2 weeks.  Diagnosis: Bipolar affective disorder, currently depressed, moderate ([place])  Collaboration of Care: Other none required in this session  Patient/Guardian was advised Release of Information must be obtained prior to any record release in order to collaborate their care with an outside provider. Patient/Guardian was advised if they have not already done so to contact the registration department  to sign all necessary forms in order for [place] to release information regarding their care.   Consent: Patient/Guardian gives verbal consent for treatment and assignment of benefits for services provided during this visit. Patient/Guardian expressed understanding and agreed to proceed.   [name] [place], LCSW 01/10/2022

## 2022-01-15 NOTE — Telephone Encounter (Signed)
If symptoms have resolved please just have patient monitor for recurrence. If she is still having symptoms or has another episode, please have her schedule an appointment. Thank you

## 2022-01-16 ENCOUNTER — Encounter (HOSPITAL_COMMUNITY): Payer: Self-pay | Admitting: Licensed Clinical Social Worker

## 2022-01-16 ENCOUNTER — Ambulatory Visit (INDEPENDENT_AMBULATORY_CARE_PROVIDER_SITE_OTHER): Payer: Medicare Other | Admitting: Licensed Clinical Social Worker

## 2022-01-16 DIAGNOSIS — F3132 Bipolar disorder, current episode depressed, moderate: Secondary | ICD-10-CM | POA: Diagnosis not present

## 2022-01-16 DIAGNOSIS — M545 Low back pain, unspecified: Secondary | ICD-10-CM | POA: Diagnosis not present

## 2022-01-16 NOTE — Progress Notes (Signed)
  Virtual Visit via Telephone Note  I connected with Jordan [name] on 01/16/22 at  8:00 AM EST by telephone and verified that I am speaking with the correct person using two identifiers.  Location: Patient: home Provider: office   I discussed the limitations, risks, security and privacy concerns of performing an evaluation and management service by telephone and the availability of in person appointments. I also discussed with the patient that there may be a patient responsible charge related to this service. The patient expressed understanding and agreed to proceed.       I discussed the assessment and treatment plan with the patient. The patient was provided an opportunity to ask questions and all were answered. The patient agreed with the plan and demonstrated an understanding of the instructions.   The patient was advised to call back or seek an in-person evaluation if the symptoms worsen or if the condition fails to improve as anticipated.  I provided 45 minutes of non-face-to-face time during this encounter.   [name], LCSW  THERAPIST PROGRESS NOTE  Session Time: 8:00-8:45am  Participation Level: Active  Behavioral Response: NAAlertIrritable  Type of Therapy: Individual Therapy  Treatment Goals addressed: "help me cope with my mood and all of the changes I am going through". Jordan [name] will experience fewer incidents of mood outbursts, anger, and frustration at home 4 out of 7 days, per self report     ProgressTowards Goals: Progressing  Interventions: CBT  Summary: Jordan [name] is a 41 y.o. female who presents with Bipolar Affective Disorder, currently depressed, moderate.   Suicidal/Homicidal: Nowithout intent/plan  Therapist Response: Jordan [name] engaged well in individual telephone session. Clinician utilized CBT to process thoughts, feelings, and interactions over the Thanksgiving holiday. Jordan [name] identified some ups and downs, noting some disappointment  and frustration with partner. Clinician reflected and summarized thoughts and feelings about partner's lack of trust, as well as her tendency to "have conversations in her head without Jordan [name], and then get mad about it". Clinician explored Jordan [name]'s responses to these accusatory texts. Jordan [name] identified this is interfering with her interest in having a committed relationship with this woman. Clinician validated this and explored ways to communicate this with partner.  Clinician explored relationship with sister, mother, and nieces. Jordan [name] identified improvement with sister, but some jealousy that sister and nieces want to get matching mother/daughter tattoos. Clinician processed this with Jordan [name].   Plan: Return again in 1 weeks.  Diagnosis: Bipolar affective disorder, currently depressed, moderate ([place])  Collaboration of Care: Other none required in this session  Patient/Guardian was advised Release of Information must be obtained prior to any record release in order to collaborate their care with an outside provider. Patient/Guardian was advised if they have not already done so to contact the registration department to sign all necessary forms in order for [place] to release information regarding their care.   Consent: Patient/Guardian gives verbal consent for treatment and assignment of benefits for services provided during this visit. Patient/Guardian expressed understanding and agreed to proceed.   [place], LCSW 01/16/2022

## 2022-01-17 ENCOUNTER — Telehealth (HOSPITAL_BASED_OUTPATIENT_CLINIC_OR_DEPARTMENT_OTHER): Payer: Medicare Other | Admitting: Psychiatry

## 2022-01-17 ENCOUNTER — Encounter (HOSPITAL_COMMUNITY): Payer: Self-pay | Admitting: Psychiatry

## 2022-01-17 DIAGNOSIS — F4310 Post-traumatic stress disorder, unspecified: Secondary | ICD-10-CM

## 2022-01-17 DIAGNOSIS — F3132 Bipolar disorder, current episode depressed, moderate: Secondary | ICD-10-CM

## 2022-01-17 DIAGNOSIS — F419 Anxiety disorder, unspecified: Secondary | ICD-10-CM

## 2022-01-17 MED ORDER — DIVALPROEX SODIUM ER 500 MG PO TB24: 1000.0000 mg | ORAL_TABLET | Freq: Every day | ORAL | 2 refills | Status: DC
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
## 2022-01-22 DIAGNOSIS — M545 Low back pain, unspecified: Secondary | ICD-10-CM | POA: Diagnosis not present

## 2022-01-23 ENCOUNTER — Ambulatory Visit (HOSPITAL_COMMUNITY): Payer: Medicare Other | Admitting: Licensed Clinical Social Worker

## 2022-01-25 ENCOUNTER — Ambulatory Visit (INDEPENDENT_AMBULATORY_CARE_PROVIDER_SITE_OTHER): Payer: Medicare Other | Admitting: Licensed Clinical Social Worker

## 2022-01-25 ENCOUNTER — Encounter (HOSPITAL_COMMUNITY): Payer: Self-pay | Admitting: Licensed Clinical Social Worker

## 2022-01-25 DIAGNOSIS — F3131 Bipolar disorder, current episode depressed, mild: Secondary | ICD-10-CM

## 2022-01-25 NOTE — Progress Notes (Signed)
Virtual Visit via Video Note  I connected with Jordan [name] on 01/25/22 at  8:00 AM EST by a video enabled telemedicine application and verified that I am speaking with the correct person using two identifiers.  Location: Patient: home Provider: home office   I discussed the limitations of evaluation and management by telemedicine and the availability of in person appointments. The patient expressed understanding and agreed to proceed.   I discussed the assessment and treatment plan with the patient. The patient was provided an opportunity to ask questions and all were answered. The patient agreed with the plan and demonstrated an understanding of the instructions.   The patient was advised to call back or seek an in-person evaluation if the symptoms worsen or if the condition fails to improve as anticipated.  I provided 50 minutes of non-face-to-face time during this encounter.   [name], LCSW   THERAPIST PROGRESS NOTE  Session Time: 8:00am-8:50am  Participation Level: Active  Behavioral Response: Well GroomedAlertEuthymic  Type of Therapy: Individual Therapy  Treatment Goals addressed: "help me cope with my mood and all of the changes I am going through". Jordan [name] will experience fewer incidents of mood outbursts, anger, and frustration at home 4 out of 7 days, per self report     ProgressTowards Goals: Progressing  Interventions: CBT  Summary: Jordan [name] is a 41 y.o. female who presents with Bipolar 1 Disorder, depressed, mild.   Suicidal/Homicidal: Nowithout intent/plan  Therapist Response: Jannat engaged well in individual session with clinician. Clinician utilized CBT to process thoughts, feelings, and interactions. Clinician discussed relationship with partner and identified improvement in increased commitment. Navjot shared that the plan for partner and daughter to move in is set for 12/15 and they are all looking forward to it. Clinician discussed  the importance of clear communication about wants, needs, and expectations. Clinician discussed personal intentions for 2024: working on finances, saying no, staying in the gym, and working on relationships with partner and daughter, and her nieces.   Plan: Return again in 1-2 weeks.  Diagnosis: Bipolar 1 disorder, depressed, mild ([place])  Collaboration of Care: Other none required in this session  Patient/Guardian was advised Release of Information must be obtained prior to any record release in order to collaborate their care with an outside provider. Patient/Guardian was advised if they have not already done so to contact the registration department to sign all necessary forms in order for [place] to release information regarding their care.   Consent: Patient/Guardian gives verbal consent for treatment and assignment of benefits for services provided during this visit. Patient/Guardian expressed understanding and agreed to proceed.   [name] [place], LCSW 01/25/2022

## 2022-01-29 DIAGNOSIS — M545 Low back pain, unspecified: Secondary | ICD-10-CM | POA: Diagnosis not present

## 2022-01-30 ENCOUNTER — Ambulatory Visit (INDEPENDENT_AMBULATORY_CARE_PROVIDER_SITE_OTHER): Payer: Medicare Other | Admitting: Licensed Clinical Social Worker

## 2022-01-30 ENCOUNTER — Encounter (HOSPITAL_COMMUNITY): Payer: Self-pay

## 2022-01-30 DIAGNOSIS — F3131 Bipolar disorder, current episode depressed, mild: Secondary | ICD-10-CM | POA: Diagnosis not present

## 2022-01-31 ENCOUNTER — Encounter (HOSPITAL_COMMUNITY): Payer: Self-pay | Admitting: Emergency Medicine

## 2022-01-31 ENCOUNTER — Other Ambulatory Visit: Payer: Self-pay

## 2022-01-31 ENCOUNTER — Ambulatory Visit (HOSPITAL_COMMUNITY)
Admission: EM | Admit: 2022-01-31 | Discharge: 2022-01-31 | Disposition: A | Discharge status: Home / Self Care | Payer: Medicare Other | Attending: Emergency Medicine | Admitting: Emergency Medicine

## 2022-01-31 DIAGNOSIS — J069 Acute upper respiratory infection, unspecified: Secondary | ICD-10-CM

## 2022-01-31 MED ORDER — PREDNISONE 20 MG PO TABS: 40.0000 mg | ORAL_TABLET | Freq: Every day | ORAL | 0 refills | Status: DC

## 2022-01-31 MED ORDER — BENZONATATE 100 MG PO CAPS: 100.0000 mg | ORAL_CAPSULE | Freq: Three times a day (TID) | ORAL | 0 refills | Status: DC

## 2022-01-31 MED ORDER — PROMETHAZINE-DM 6.25-15 MG/5ML PO SYRP: 5.0000 mL | ORAL_SOLUTION | Freq: Four times a day (QID) | ORAL | 0 refills | Status: DC | PRN

## 2022-01-31 NOTE — Discharge Instructions (Signed)
Your symptoms today are most likely being caused by a virus and should steadily improve in time it can take up to 7 to 10 days before you truly start to see a turnaround however things will get better  Starting tomorrow take prednisone every morning with food for 5 days, this will help with inflammation and irritation which in turn will help calm your chest soreness as well as make it easier for you to breathe  You may hold off on use of inhaler unless you begin to experience shortness of breath or wheezing  May use Tessalon pill every 8 hours as needed for coughing  You may use cough syrup every 6 hours as needed for additional comfort, be mindful this will make you drowsy, go to GoodRx.com to find a coupon    You can take Tylenol and/or Ibuprofen as needed for fever reduction and pain relief.   For cough: honey 1/2 to 1 teaspoon (you can dilute the honey in water or another fluid).  You can also use guaifenesin and dextromethorphan for cough. You can use a humidifier for chest congestion and cough.  If you don't have a humidifier, you can sit in the bathroom with the hot shower running.      For sore throat: try warm salt water gargles, cepacol lozenges, throat spray, warm tea or water with lemon/honey, popsicles or ice, or OTC cold relief medicine for throat discomfort.   For congestion: take a daily anti-histamine like Zyrtec, Claritin, and a oral decongestant, such as pseudoephedrine.  You can also use Flonase 1-2 sprays in each nostril daily.   It is important to stay hydrated: drink plenty of fluids (water, gatorade/powerade/pedialyte, juices, or teas) to keep your throat moisturized and help further relieve irritation/discomfort.
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
## 2022-01-31 NOTE — ED Triage Notes (Addendum)
3 days ago, had a sore throat.  Today having cold sweats, cough, chest is sore.  Feels heart rate goes up with coughing episodes. Feels difficulty breathing

## 2022-02-01 ENCOUNTER — Encounter (HOSPITAL_COMMUNITY): Payer: Self-pay | Admitting: Licensed Clinical Social Worker

## 2022-02-01 NOTE — Progress Notes (Signed)
   THERAPIST PROGRESS NOTE  Session Time: 9:00am-9:45am  Participation Level: Active  Behavioral Response: Well GroomedAlertEuthymic  Type of Therapy: Individual Therapy  Treatment Goals addressed: "help me cope with my mood and all of the changes I am going through". Fani will experience fewer incidents of mood outbursts, anger, and frustration at home 4 out of 7 days, per self report    ProgressTowards Goals: Progressing  Interventions: Motivational Interviewing  Summary: Jordan [name] is a 41 y.o. female who presents with Bipolar I depressed, mild.   Suicidal/Homicidal: Nowithout intent/plan  Therapist Response: [name] engaged well in session with clinician. Clinician utilized MI OARS to reflect and summarize thoughts and feelings about relationships and interactions. Clinician explored plans for partner and daughter to move in with [name] over the weekend. Clinician processed communication skills and the importance of starting this venture together. Clinician reflected and summarized challenges with mother and sister, as well as nieces. Clinician explored ways to clear the air with nieces and to communicate to them about how she feels. [name] reports feeling uncomfortable with sharing these feelings. Clinician identified this as a need for future goals.   Plan: Return again in 1-2 weeks.  Diagnosis: Bipolar 1 disorder, depressed, mild ([place])  Collaboration of Care: Patient refused AEB none required in this session  Patient/Guardian was advised Release of Information must be obtained prior to any record release in order to collaborate their care with an outside provider. Patient/Guardian was advised if they have not already done so to contact the registration department to sign all necessary forms in order for [place] to release information regarding their care.   Consent: Patient/Guardian gives verbal consent for treatment and assignment of benefits for services provided during this  visit. Patient/Guardian expressed understanding and agreed to proceed.   [name] [place], LCSW 02/01/2022

## 2022-02-06 ENCOUNTER — Ambulatory Visit (HOSPITAL_COMMUNITY): Payer: Medicare Other | Admitting: Licensed Clinical Social Worker

## 2022-02-13 DIAGNOSIS — M545 Low back pain, unspecified: Secondary | ICD-10-CM | POA: Diagnosis not present

## 2022-02-23 ENCOUNTER — Other Ambulatory Visit: Payer: Self-pay | Admitting: Podiatry

## 2022-02-26 NOTE — Telephone Encounter (Signed)
She should be seen. Has been 5 months

## 2022-02-27 ENCOUNTER — Encounter (HOSPITAL_COMMUNITY): Payer: Self-pay | Admitting: Licensed Clinical Social Worker

## 2022-02-27 ENCOUNTER — Ambulatory Visit (INDEPENDENT_AMBULATORY_CARE_PROVIDER_SITE_OTHER): Payer: Medicare Other | Admitting: Licensed Clinical Social Worker

## 2022-02-27 DIAGNOSIS — F3131 Bipolar disorder, current episode depressed, mild: Secondary | ICD-10-CM

## 2022-02-27 NOTE — Progress Notes (Signed)
   THERAPIST PROGRESS NOTE  Session Time: 8:00am-8:45am  Participation Level: Active  Behavioral Response: Well GroomedAlertEuthymic  Type of Therapy: Individual Therapy  Treatment Goals addressed: "help me cope with my mood and all of the changes I am going through". Jordan [name] will experience fewer incidents of mood outbursts, anger, and frustration at home 4 out of 7 days, per self report    ProgressTowards Goals: Progressing  Interventions: Motivational Interviewing, Psychosocial Skills: boundary setting, communication, and Supportive  Summary: Jordan [name] is a 42 y.o. female who presents with Bipolar I disorder, depressed, mild.   Suicidal/Homicidal: Nowithout intent/plan  Therapist Response: Jordan [name] engaged well in individual session with clinician. Clinician utilized MI OARS and supportive counseling to process updates in life over the past several weeks. Clinician reflected joy and pain, as well as processed coping skills for family relationships and trauma. Clinician explored changes in relationship with partner and identified some important boundary setting with family. Jordan [name] shared that living with partner has been going well and she feels happy. Jordan [name] also processed mother's recent health problems and concerns about her care in the home.   Plan: Return again in 1-2 weeks.  Diagnosis: Bipolar 1 disorder, depressed, mild ([place])  Collaboration of Care: Psychiatrist AEB Varnell shared increase in tremors in her hands. Clinician notified Dr. Adele Schilder and scheduled appointment for 1/10.   Patient/Guardian was advised Release of Information must be obtained prior to any record release in order to collaborate their care with an outside provider. Patient/Guardian was advised if they have not already done so to contact the registration department to sign all necessary forms in order for [place] to release information regarding their care.   Consent: Patient/Guardian gives verbal consent for  treatment and assignment of benefits for services provided during this visit. Patient/Guardian expressed understanding and agreed to proceed.   [place], LCSW 02/27/2022

## 2022-02-28 ENCOUNTER — Other Ambulatory Visit (HOSPITAL_COMMUNITY): Payer: Self-pay | Admitting: *Deleted

## 2022-02-28 ENCOUNTER — Telehealth (HOSPITAL_BASED_OUTPATIENT_CLINIC_OR_DEPARTMENT_OTHER): Payer: Medicare Other | Admitting: Psychiatry

## 2022-02-28 ENCOUNTER — Encounter (HOSPITAL_COMMUNITY): Payer: Self-pay | Admitting: Psychiatry

## 2022-02-28 DIAGNOSIS — F3132 Bipolar disorder, current episode depressed, moderate: Secondary | ICD-10-CM | POA: Diagnosis not present

## 2022-02-28 DIAGNOSIS — Z5181 Encounter for therapeutic drug level monitoring: Secondary | ICD-10-CM

## 2022-02-28 DIAGNOSIS — F419 Anxiety disorder, unspecified: Secondary | ICD-10-CM | POA: Diagnosis not present

## 2022-02-28 DIAGNOSIS — F4310 Post-traumatic stress disorder, unspecified: Secondary | ICD-10-CM

## 2022-02-28 IMAGING — US US SOFT TISSUE HEAD/NECK
1 series · 14 of 16 positions shown · non-contrast
Comparison: None.

CLINICAL DATA: Left neck lump for 2 years

EXAM:
ULTRASOUND OF HEAD/NECK SOFT TISSUES
TECHNIQUE: Ultrasound examination of the head and neck soft tissues was
performed in the area of clinical concern.

[Series 1: us soft tissue head & neck (non-thyroid) · 16 acquisitions, 14 frames shown]
[im 1/16]
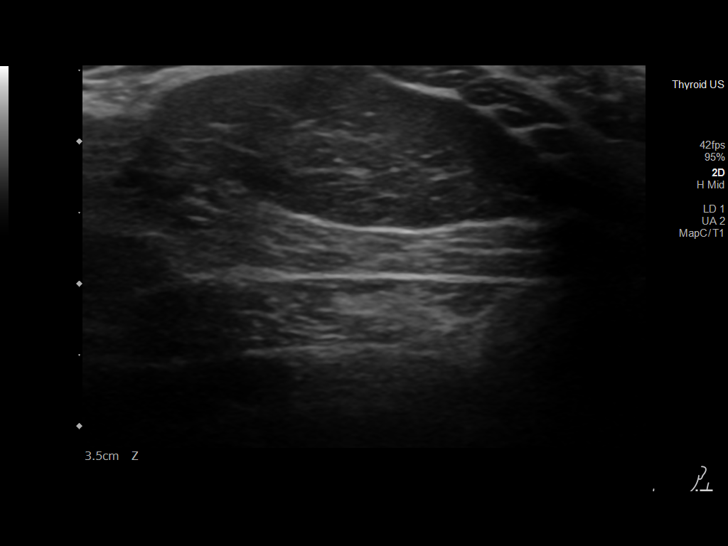
[im 2/16]
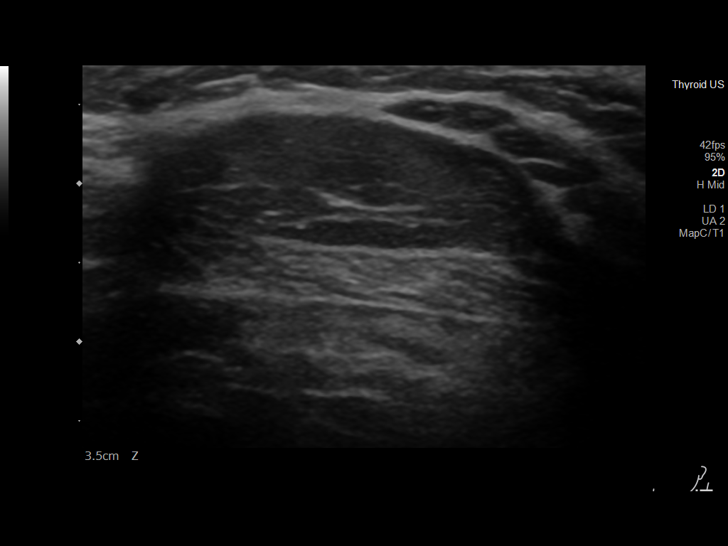
[im 3/16]
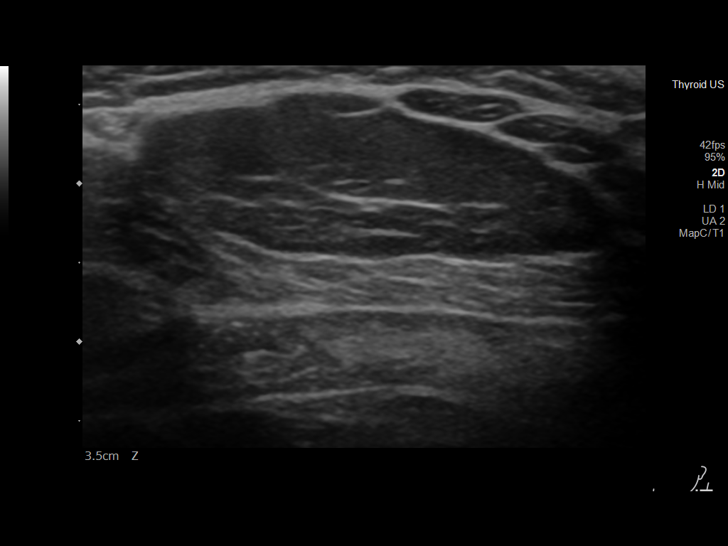
[im 5/16]
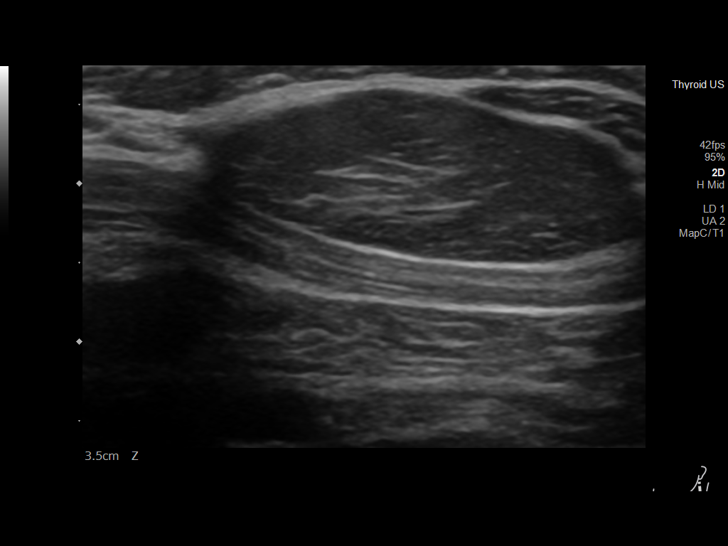
[im 6/16]
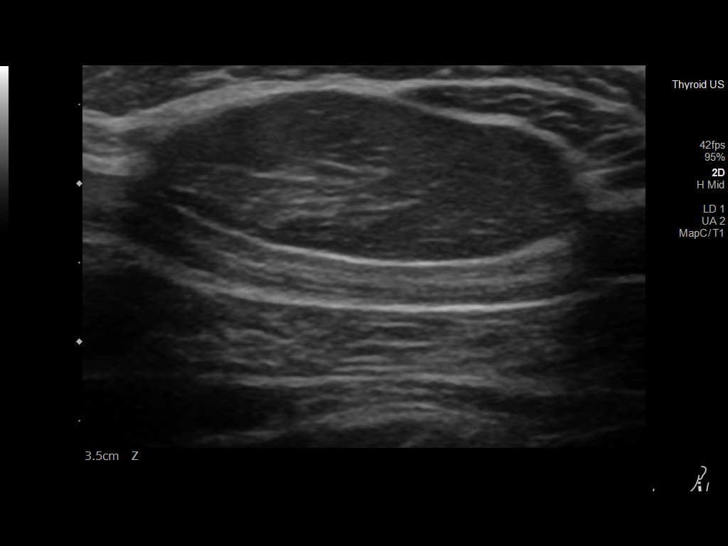
[im 7/16]
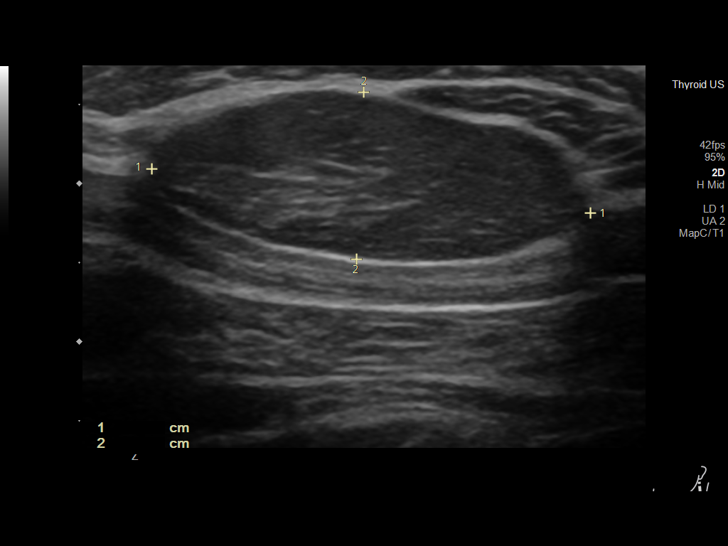
[im 8/16]
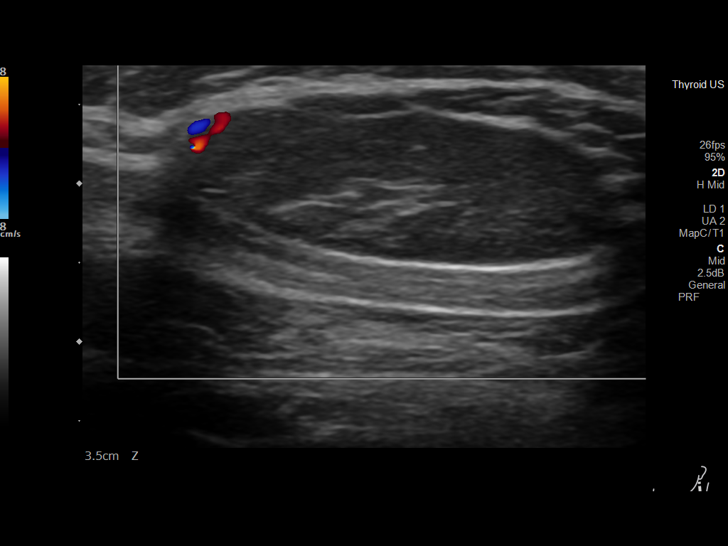
[im 9/16]
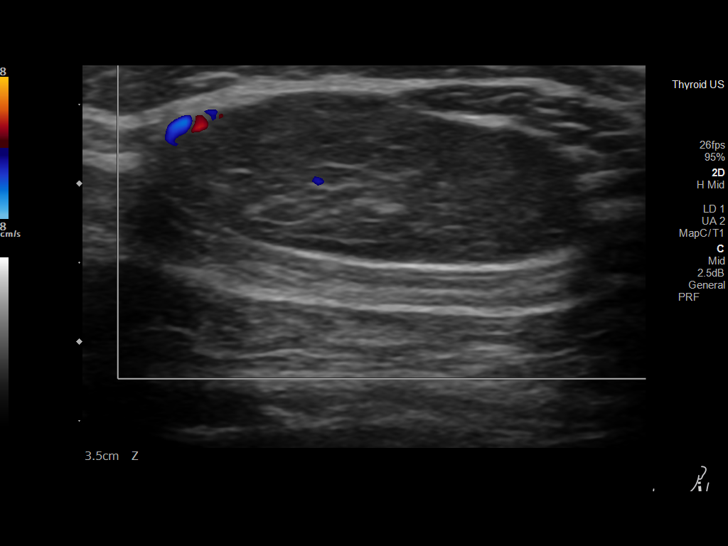
[im 10/16]
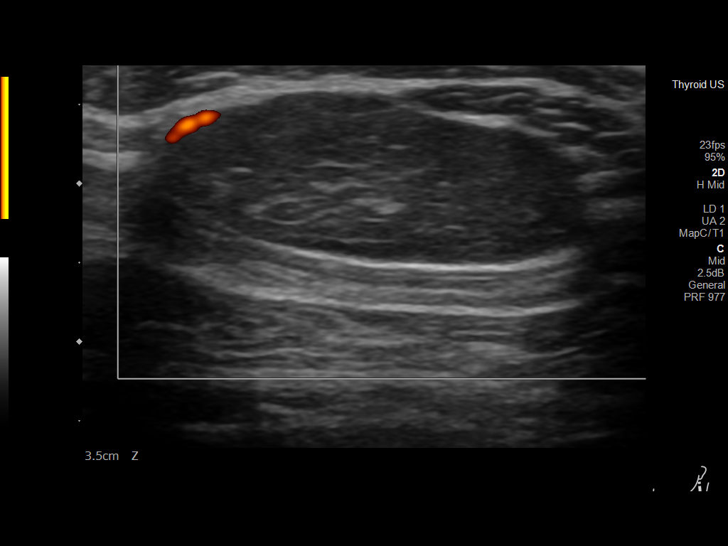
[im 11/16]
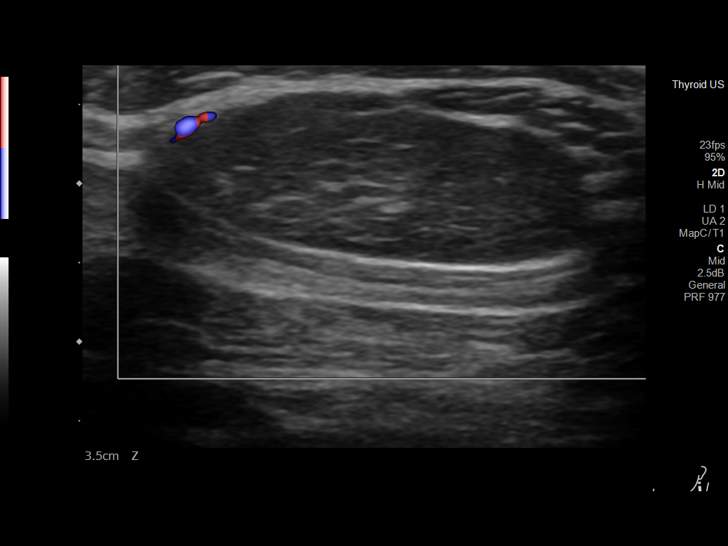
[im 13/16]
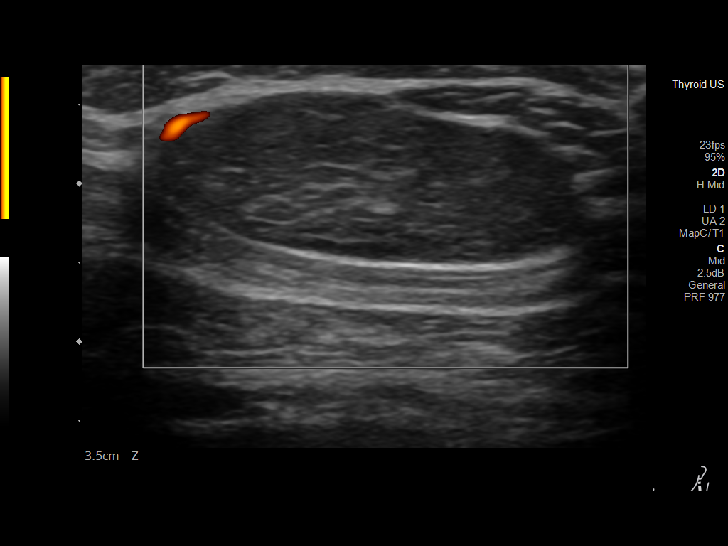
[im 14/16]
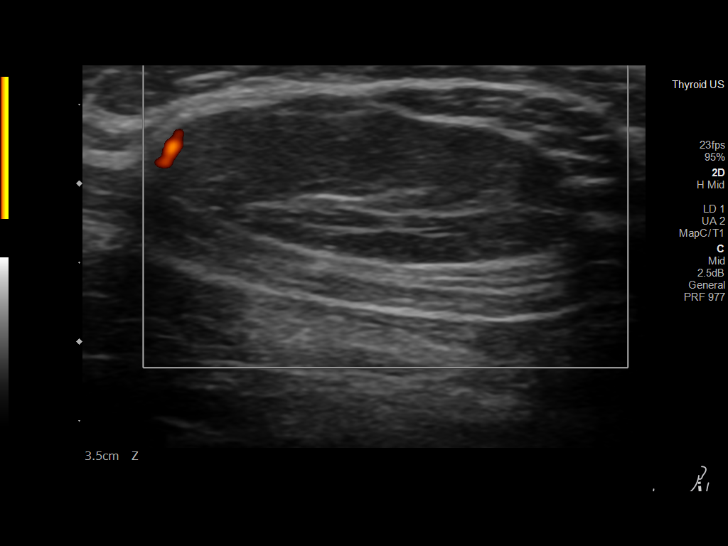
[im 15/16]
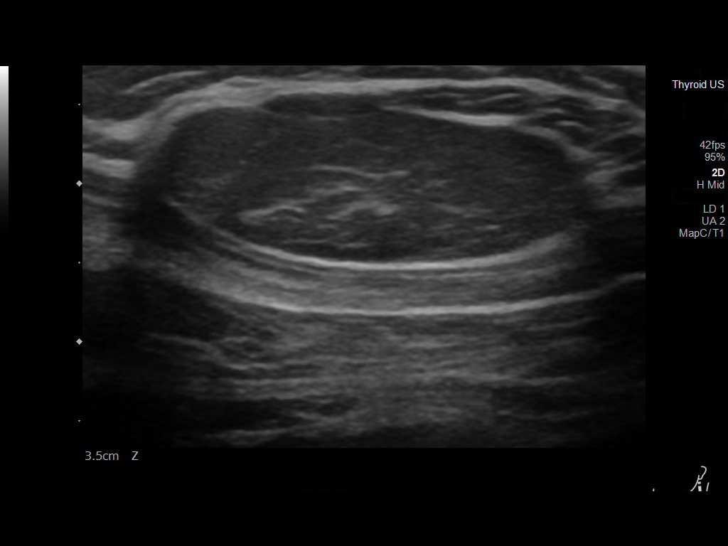
[im 16/16]
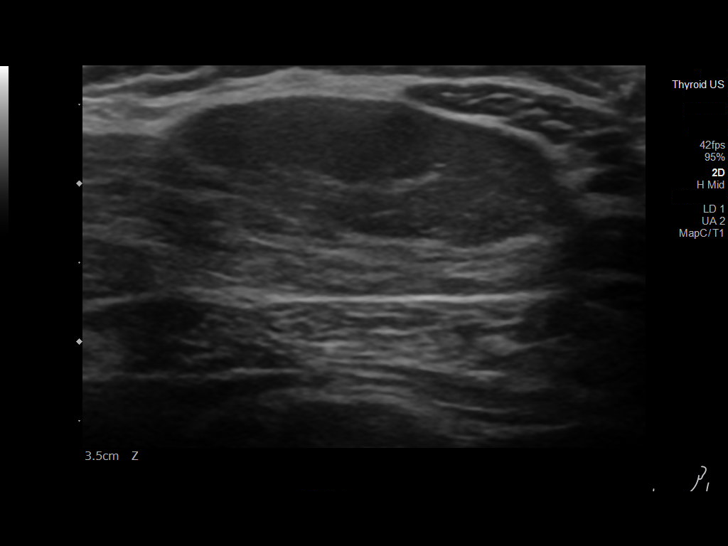

[14 of 16 positions shown; findings below may reference images not displayed]

FINDINGS: Soft tissue ultrasound performed of the left neck palpable
abnormality.

This correlates with a solid oval well-circumscribed avascular
nodule, isoechoic when compared to adjacent subcutaneous fat. Lesion
measures 2.8 x 1.1 x 2.9 cm. Findings favored to be a superficial
lipoma.

No other regional soft tissue abnormality, cyst, fluid collection,
or bulky adenopathy.
IMPRESSION: Left neck palpable abnormality favored to be a superficial lipoma
measuring up to 2.9 cm.

## 2022-02-28 MED ORDER — HYDROXYZINE HCL 10 MG PO TABS: 10.0000 mg | ORAL_TABLET | Freq: Every day | ORAL | 2 refills | Status: DC | PRN

## 2022-02-28 MED ORDER — DIVALPROEX SODIUM ER 500 MG PO TB24: 1000.0000 mg | ORAL_TABLET | Freq: Every day | ORAL | 2 refills | Status: DC
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
## 2022-03-01 NOTE — Telephone Encounter (Signed)
Called patient to schedule f/u appointment, left voice message to return call to schedule.

## 2022-03-02 ENCOUNTER — Telehealth: Payer: Self-pay | Admitting: Podiatry

## 2022-03-02 NOTE — Telephone Encounter (Signed)
Left message for pt that per Dr Paulla Dolly we would need to see pt to refill the medication.

## 2022-03-06 ENCOUNTER — Encounter (HOSPITAL_COMMUNITY): Payer: Self-pay | Admitting: Licensed Clinical Social Worker

## 2022-03-06 ENCOUNTER — Ambulatory Visit (INDEPENDENT_AMBULATORY_CARE_PROVIDER_SITE_OTHER): Payer: Medicare Other | Admitting: Licensed Clinical Social Worker

## 2022-03-06 DIAGNOSIS — F3131 Bipolar disorder, current episode depressed, mild: Secondary | ICD-10-CM | POA: Diagnosis not present

## 2022-03-06 NOTE — Progress Notes (Signed)
   THERAPIST PROGRESS NOTE  Session Time: 8:00am-8:40am  Participation Level: Active  Behavioral Response: Well GroomedAlertEuthymic  Type of Therapy: Individual Therapy  Treatment Goals addressed: "help me cope with my mood and all of the changes I am going through". Jamal will experience fewer incidents of mood outbursts, anger, and frustration at home 4 out of 7 days, per self report     ProgressTowards Goals: Progressing  Interventions: CBT  Summary: JACQUELYNN FRIEND is a 42 y.o. female who presents with Bipolar I Depressed, mild  Suicidal/Homicidal: Nowithout intent/plan  Therapist Response: Erricka engaged well in individual session with clinician. Clinician utilized CBT to process updates in relationship with partner, children, and family. Clinician explored thoughts and feelings about extended family and their role in caring for mother. Clinician explored how boundaries have been stressful to maintain, but helpful for life. Clinician encouraged Ame to continue holding tight to her boundaries, especially regarding her siblings. Marai shared positive relationship with partner and her daughter. Clinician identified the importance of continuing to encourage them to seek counseling support. Kimberlin shared that she has started daily hydroxyzine, which has been helping her anxiety and has reduced her urge to get angry easily.   Plan: Return again in 1-2 weeks.  Diagnosis: Bipolar 1 disorder, depressed, mild ([place])  Collaboration of Care: Medication Management AEB reviewed last note from Dr. Adele Schilder  Patient/Guardian was advised Release of Information must be obtained prior to any record release in order to collaborate their care with an outside provider. Patient/Guardian was advised if they have not already done so to contact the registration department to sign all necessary forms in order for [place] to release information regarding their care.   Consent: Patient/Guardian gives verbal  consent for treatment and assignment of benefits for services provided during this visit. Patient/Guardian expressed understanding and agreed to proceed.   [name] [place], LCSW 03/06/2022

## 2022-03-07 ENCOUNTER — Encounter: Payer: Self-pay | Admitting: Podiatry

## 2022-03-07 ENCOUNTER — Ambulatory Visit (INDEPENDENT_AMBULATORY_CARE_PROVIDER_SITE_OTHER): Payer: 59 | Admitting: Podiatry

## 2022-03-07 DIAGNOSIS — M722 Plantar fascial fibromatosis: Secondary | ICD-10-CM

## 2022-03-07 MED ORDER — TRIAMCINOLONE ACETONIDE 10 MG/ML IJ SUSP: 20.0000 mg | Freq: Once | INTRAMUSCULAR | Status: DC

## 2022-03-08 NOTE — Progress Notes (Signed)
Subjective:   Patient ID: Jordan [name], female   DOB: 42 y.o.   MRN: [id number]   HPI Patient states she has had a reoccurrence of heel pain in both feet and states she has been doing pretty good taking oral diclofenac   ROS      Objective:  Physical Exam  Neurovascular status intact with exquisite discomfort medial fascial band left over right with inflammation at the insertion moderate flatfoot deformity and patient with moderate obesity who is currently trying to exercise to lose weight     Assessment:  Acute plan tar fasciitis bilateral at insertion     Plan:  H&P reviewed condition went ahead today did sterile prep and injected the fascial insertion 3 mg Kenalog 5 mg Xylocaine bilateral applied sterile dressing instructed on support and shoe gear modifications and did go ahead and refill her diclofenac and I want to follow-up with her family physician for this and advised her to see him and he will monitor her diclofenac usage

## 2022-03-08 NOTE — Telephone Encounter (Signed)
completed

## 2022-03-08 NOTE — Telephone Encounter (Signed)
I saw her yesterday

## 2022-03-09 ENCOUNTER — Telehealth: Payer: Self-pay | Admitting: *Deleted

## 2022-03-09 NOTE — Telephone Encounter (Signed)
Please contact pt below.  States having chest pains.Thanks,charsetta----- Message -----From: Janae Bridgeman: 03/08/2022   2:59 PM ESTTo: Imp Front Desk PoolSubject: Appointment Request                          Appointment Request From: Sherri Sear Provider: Velna Ochs, MD [facility]]Preferred Date Range: 03/14/2022 - 1/26/2024Preferred Times: Monday Afternoon, Tuesday Afternoon, Wednesday Afternoon, Thursday Afternoon, Friday AfternoonReason for visit: Office VisitComments:Chest pain, pain in left breast, and pain in left armpit   I called pt who stated she has been having intermittent pain since last week. She's having left breast / armpit pain; under and in her breast. Stated she has a hx of right breast mass and has a mammogram every 6 months. Her next mammogram is schedule on 03/30/22. Denies sob/dizziness. Stated the pain does not radiate down her arm nor back. Appt schedule w/Dr Philipp Ovens on Wed 1/24 @ [id number].

## 2022-03-13 ENCOUNTER — Ambulatory Visit (INDEPENDENT_AMBULATORY_CARE_PROVIDER_SITE_OTHER): Payer: 59 | Admitting: Licensed Clinical Social Worker

## 2022-03-13 ENCOUNTER — Encounter (HOSPITAL_COMMUNITY): Payer: Self-pay | Admitting: Licensed Clinical Social Worker

## 2022-03-13 DIAGNOSIS — F3131 Bipolar disorder, current episode depressed, mild: Secondary | ICD-10-CM | POA: Diagnosis not present

## 2022-03-13 NOTE — Progress Notes (Signed)
   THERAPIST PROGRESS NOTE  Session Time: 8:00am-8:40am  Participation Level: Active  Behavioral Response: Well GroomedAlertDepressed and in pain  Type of Therapy: Individual Therapy  Treatment Goals addressed: "help me cope with my mood and all of the changes I am going through". [name] will experience fewer incidents of mood outbursts, anger, and frustration at home 4 out of 7 days, per self report      ProgressTowards Goals: Progressing  Interventions: Motivational Interviewing  Summary: Jordan [name] is a 42 y.o. female who presents with Bipolar I, depressed, mild.   Suicidal/Homicidal: Nowithout intent/plan  Therapist Response: Jordan [name] engaged well in individual session with clinician. Clinician processed interactions, thoughts and feelings. Jordan [name] shared challenges with finances and processed the steps she will take to contact [employer] regarding making ends meet and not being penalized by increasing her hourly wage. Clinician explored challenges with partner and noted some improvement in partner understanding how to handle when Jordan [name] has some depression or irritability. Clinician also processed concerns and frustrations that due to her boundaries with her family, the family is no longer contacting her just to check in and see how she is doing. Clinician provided feedback and encouraged positive coping skills.   Plan: Return again in 1-2 weeks.  Diagnosis: Bipolar 1 disorder, depressed, mild ([place])  Collaboration of Care: Medication Management AEB reviewed notes from last med management encounter  Patient/Guardian was advised Release of Information must be obtained prior to any record release in order to collaborate their care with an outside provider. Patient/Guardian was advised if they have not already done so to contact the registration department to sign all necessary forms in order for [place] to release information regarding their care.   Consent: Patient/Guardian gives  verbal consent for treatment and assignment of benefits for services provided during this visit. Patient/Guardian expressed understanding and agreed to proceed.   [name] [place], LCSW 03/13/2022

## 2022-03-14 ENCOUNTER — Other Ambulatory Visit: Payer: Self-pay | Admitting: Internal Medicine

## 2022-03-14 ENCOUNTER — Encounter: Payer: Self-pay | Admitting: Internal Medicine

## 2022-03-14 ENCOUNTER — Other Ambulatory Visit: Payer: Self-pay

## 2022-03-14 ENCOUNTER — Ambulatory Visit (INDEPENDENT_AMBULATORY_CARE_PROVIDER_SITE_OTHER): Payer: 59 | Admitting: Internal Medicine

## 2022-03-14 VITALS — BP 100/66 | HR 71 | Temp 98.5°F | Ht 69.0 in | Wt 284.7 lb

## 2022-03-14 DIAGNOSIS — D250 Submucous leiomyoma of uterus: Secondary | ICD-10-CM

## 2022-03-14 DIAGNOSIS — N644 Mastodynia: Secondary | ICD-10-CM

## 2022-03-14 DIAGNOSIS — J4520 Mild intermittent asthma, uncomplicated: Secondary | ICD-10-CM

## 2022-03-14 DIAGNOSIS — N6310 Unspecified lump in the right breast, unspecified quadrant: Secondary | ICD-10-CM

## 2022-03-14 DIAGNOSIS — Z6841 Body Mass Index (BMI) 40.0 and over, adult: Secondary | ICD-10-CM

## 2022-03-14 DIAGNOSIS — T8130XA Disruption of wound, unspecified, initial encounter: Secondary | ICD-10-CM | POA: Diagnosis not present

## 2022-03-14 DIAGNOSIS — E611 Iron deficiency: Secondary | ICD-10-CM

## 2022-03-14 DIAGNOSIS — E7841 Elevated Lipoprotein(a): Secondary | ICD-10-CM

## 2022-03-14 DIAGNOSIS — Z131 Encounter for screening for diabetes mellitus: Secondary | ICD-10-CM | POA: Diagnosis not present

## 2022-03-14 NOTE — Assessment & Plan Note (Signed)
I am unable to appreciate any palpable masses or lymphadenopathy however she does have nonspecific swelling of her left axilla which is very tender to light palpation.  She was already scheduled to follow-up with the breast center on 2/8 for her probably benign right breast mass.  I have changed her orders to bilateral diagnostic mammo and ultrasound and confirmed with the breast center that they can evaluate for her new left-sided symptoms at that same visit.

## 2022-03-14 NOTE — Assessment & Plan Note (Signed)
Scheduled for follow-up ultrasound with the breast center on 2/8.  This has been changed to bilateral diagnostic mammo/ultrasound given her new symptoms on the left.

## 2022-03-14 NOTE — Assessment & Plan Note (Signed)
Currently receiving treatment with her OB/GYN, I do not have these records.  Reports she completed a course of Megace and sounds like she underwent some type of uterine artery embolization or ablation for her fibroids.  She is not currently on iron supplements but does have a history of iron deficiency anemia.  Reports menorrhagia with irregular menstrual cycles.  Checking CBC with iron studies today.

## 2022-03-14 NOTE — Assessment & Plan Note (Signed)
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  She has a large midline incisional scar with some keloid formation.  She reports recent weight gain and has small area of wound dehiscence at the distal incision just below the abdominal pannus.  The dehiscence is very superficial but she reports some intermittent drainage and bleeding.  There is no evidence of infection on my exam.  I advised topical Neosporin or bacitracin ointment until wound heals.  She will call if this does not improve or dehiscence worsens.

## 2022-03-14 NOTE — Assessment & Plan Note (Signed)
Repeating lipid panel today given recent weight gain.

## 2022-03-14 NOTE — Assessment & Plan Note (Signed)
Checking CMP, lipids, and screening hemoglobin A1c today.  She is actively working on weight loss and has recently started working out in [employer].  She feels much of her recent weight loss may be attributed to her Depakote, which she takes for treatment of her bipolar disorder.  She is stable from a mental health perspective but is struggling with weight gain.  We are screening for diabetes today, could consider GLP-1 agonist pending results.  Unfortunately most insurances do not cover these medications for the indication of weight loss alone.

## 2022-03-14 NOTE — Telephone Encounter (Signed)
Doctor does not want her taking

## 2022-03-14 NOTE — Assessment & Plan Note (Signed)
Patient developed a post viral cough syndrome with reactive airway disease last October after being diagnosed with COVID-19.  She reports persistent intermittent dyspnea and wheezing, and uses Qvar PRN about once a week.  It has been over 3 months since her COVID-19 diagnosis.  I discussed my recommendations for PFTs if symptoms do not improve.  We are prioritizing workup for her breast changes today.  But I have asked her to follow-up again in 3 months.  Will consider PFTs/pulmonology referral if symptoms persist. Continue Qvar PRN for now.
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
## 2022-03-15 LAB — CBC WITH DIFFERENTIAL/PLATELET
Basophils Absolute: 0 10*3/uL (ref 0.0–0.2)
Basos: 0 %
EOS (ABSOLUTE): 0 10*3/uL (ref 0.0–0.4)
Eos: 0 %
Hematocrit: 41.7 % (ref 34.0–46.6)
Hemoglobin: 13.9 g/dL (ref 11.1–15.9)
Immature Grans (Abs): 0 10*3/uL (ref 0.0–0.1)
Immature Granulocytes: 0 %
Lymphocytes Absolute: 2.6 10*3/uL (ref 0.7–3.1)
Lymphs: 42 %
MCH: 29.1 pg (ref 26.6–33.0)
MCHC: 33.3 g/dL (ref 31.5–35.7)
MCV: 87 fL (ref 79–97)
Monocytes Absolute: 0.7 10*3/uL (ref 0.1–0.9)
Monocytes: 11 %
Neutrophils Absolute: 2.8 10*3/uL (ref 1.4–7.0)
Neutrophils: 47 %
Platelets: 204 10*3/uL (ref 150–450)
RBC: 4.77 x10E6/uL (ref 3.77–5.28)
RDW: 13.5 % (ref 11.7–15.4)
WBC: 6.1 10*3/uL (ref 3.4–10.8)

## 2022-03-15 LAB — CMP14 + ANION GAP
ALT: 14 IU/L (ref 0–32)
AST: 17 IU/L (ref 0–40)
Albumin/Globulin Ratio: 1.7 (ref 1.2–2.2)
Albumin: 4 g/dL (ref 3.9–4.9)
Alkaline Phosphatase: 42 IU/L — ABNORMAL LOW (ref 44–121)
Anion Gap: 14 mmol/L (ref 10.0–18.0)
BUN/Creatinine Ratio: 17 (ref 9–23)
BUN: 14 mg/dL (ref 6–24)
Bilirubin Total: 0.3 mg/dL (ref 0.0–1.2)
CO2: 22 mmol/L (ref 20–29)
Calcium: 9.1 mg/dL (ref 8.7–10.2)
Chloride: 102 mmol/L (ref 96–106)
Creatinine, Ser: 0.81 mg/dL (ref 0.57–1.00)
Globulin, Total: 2.4 g/dL (ref 1.5–4.5)
Glucose: 82 mg/dL (ref 70–99)
Potassium: 4.1 mmol/L (ref 3.5–5.2)
Sodium: 138 mmol/L (ref 134–144)
Total Protein: 6.4 g/dL (ref 6.0–8.5)
eGFR: 93 mL/min/{1.73_m2} (ref >59)

## 2022-03-15 LAB — HEMOGLOBIN A1C
Est. average glucose Bld gHb Est-mCnc: 114 mg/dL
Hgb A1c MFr Bld: 5.6 % (ref 4.8–5.6)

## 2022-03-15 LAB — IRON,TIBC AND FERRITIN PANEL
Ferritin: 70 ng/mL (ref 15–150)
Iron Saturation: 31 % (ref 15–55)
Iron: 89 ug/dL (ref 27–159)
Total Iron Binding Capacity: 286 ug/dL (ref 250–450)
UIBC: 197 ug/dL (ref 131–425)

## 2022-03-15 LAB — LIPID PANEL
Chol/HDL Ratio: 3.1 ratio (ref 0.0–4.4)
Cholesterol, Total: 242 mg/dL — ABNORMAL HIGH (ref 100–199)
HDL: 78 mg/dL (ref >39)
LDL Chol Calc (NIH): 151 mg/dL — ABNORMAL HIGH (ref 0–99)
Triglycerides: 76 mg/dL (ref 0–149)
VLDL Cholesterol Cal: 13 mg/dL (ref 5–40)

## 2022-03-20 ENCOUNTER — Encounter (HOSPITAL_COMMUNITY): Payer: Self-pay | Admitting: Licensed Clinical Social Worker

## 2022-03-20 ENCOUNTER — Ambulatory Visit (INDEPENDENT_AMBULATORY_CARE_PROVIDER_SITE_OTHER): Payer: 59 | Admitting: Licensed Clinical Social Worker

## 2022-03-20 DIAGNOSIS — F3131 Bipolar disorder, current episode depressed, mild: Secondary | ICD-10-CM | POA: Diagnosis not present

## 2022-03-20 NOTE — Progress Notes (Signed)
   THERAPIST PROGRESS NOTE  Session Time: 8:00am-8:45am  Participation Level: Active  Behavioral Response: Well GroomedAlertDepressed and Irritable  Type of Therapy: Individual Therapy  Treatment Goals addressed:  "help me cope with my mood and all of the changes I am going through". [name] will experience fewer incidents of mood outbursts, anger, and frustration at home 4 out of 7 days, per self report       ProgressTowards Goals: Progressing  Interventions: CBT and Supportive  Summary: Jordan [name] is a 42 y.o. female who presents with Bipolar I, depressed.   Suicidal/Homicidal: Nowithout intent/plan  Therapist Response: Cameran engaged well in individual session with clinician. Clinician processed recent challenges in communication with partner. Clinician utilized CBT to explore how [name]'s emotional state has impacted her relationship with her parter, as well as her partner's emotional state's impact on [name]. [name] shared that she continues to work on her own attitude and "chooses to be nice". Clinician noted the challenges when partner wakes up angry and there is no explanation. Clinician utilized CBT reality testing with [name] about how things may or may not work out.   Plan: Return again in 1 week.  Diagnosis: Bipolar 1 disorder, depressed, mild ([place])  Collaboration of Care: Patient refused AEB none required  Patient/Guardian was advised Release of Information must be obtained prior to any record release in order to collaborate their care with an outside provider. Patient/Guardian was advised if they have not already done so to contact the registration department to sign all necessary forms in order for [place] to release information regarding their care.   Consent: Patient/Guardian gives verbal consent for treatment and assignment of benefits for services provided during this visit. Patient/Guardian expressed understanding and agreed to proceed.   [name] [place],  LCSW 03/20/2022

## 2022-03-26 DIAGNOSIS — M545 Low back pain, unspecified: Secondary | ICD-10-CM | POA: Diagnosis not present

## 2022-03-27 ENCOUNTER — Ambulatory Visit (INDEPENDENT_AMBULATORY_CARE_PROVIDER_SITE_OTHER): Payer: 59 | Admitting: Licensed Clinical Social Worker

## 2022-03-27 ENCOUNTER — Encounter (HOSPITAL_COMMUNITY): Payer: Self-pay | Admitting: Licensed Clinical Social Worker

## 2022-03-27 DIAGNOSIS — F3131 Bipolar disorder, current episode depressed, mild: Secondary | ICD-10-CM | POA: Diagnosis not present

## 2022-03-27 NOTE — Progress Notes (Signed)
Virtual Visit via Video Note  I connected with Jordan [name] on 03/27/22 at  8:00 AM EST by a video enabled telemedicine application and verified that I am speaking with the correct person using two identifiers.  Location: Patient: home Provider: home office   I discussed the limitations of evaluation and management by telemedicine and the availability of in person appointments. The patient expressed understanding and agreed to proceed.   I discussed the assessment and treatment plan with the patient. The patient was provided an opportunity to ask questions and all were answered. The patient agreed with the plan and demonstrated an understanding of the instructions.   The patient was advised to call back or seek an in-person evaluation if the symptoms worsen or if the condition fails to improve as anticipated.  I provided 40 minutes of non-face-to-face time during this encounter.   Jordan Curling, LCSW   THERAPIST PROGRESS NOTE  Session Time: 8:00am-8:40am  Participation Level: Active  Behavioral Response: Well GroomedAlertEuthymic  Type of Therapy: Individual Therapy  Treatment Goals addressed: Reduce frequency, intensity, and duration of depression symptoms so that daily functioning is improved    ProgressTowards Goals: Progressing  Interventions: CBT  Summary: Jordan [name] is a 42 y.o. female who presents with Bipolar I depressed.   Suicidal/Homicidal: Nowithout intent/plan  Therapist Response: Jordan [name] engaged well in individual virtual session with clinician. Clinician utilized CBT to process thoughts, feelings, and interactions. Clinician processed Jordan [name]'s concerns about nieces and noted sadness and worry that they will fall into addiction, as they are spending more time with their mother. Jordan [name] also identified concerns that older niece is making space for younger niece to live there on weekends when she is not in college. Clinician explored Jordan [name]'s sense of  responsibility vs her actual ability to control that situation. Clinician also discussed tendency to move toward the familiar. Clinician reflected Jordan [name]'s past relationships and noted that the relationship dynamics she knows and understands are based in her experience as a child and teen. Clinician identified the challenges of finding less damaged people and being able to make different choices.   Plan: Return again in 1-2 weeks.  Diagnosis: Bipolar 1 disorder, depressed, mild ([place])  Collaboration of Care: Patient refused AEB none required  Patient/Guardian was advised Release of Information must be obtained prior to any record release in order to collaborate their care with an outside provider. Patient/Guardian was advised if they have not already done so to contact the registration department to sign all necessary forms in order for [place] to release information regarding their care.   Consent: Patient/Guardian gives verbal consent for treatment and assignment of benefits for services provided during this visit. Patient/Guardian expressed understanding and agreed to proceed.   [place], LCSW 03/27/2022

## 2022-03-30 ENCOUNTER — Ambulatory Visit
Admission: RE | Admit: 2022-03-30 | Discharge: 2022-03-30 | Disposition: A | Discharge status: Home / Self Care | Payer: Medicare Other | Source: Ambulatory Visit | Attending: Internal Medicine | Admitting: Internal Medicine

## 2022-03-30 ENCOUNTER — Ambulatory Visit
Admission: RE | Admit: 2022-03-30 | Discharge: 2022-03-30 | Disposition: A | Discharge status: Home / Self Care | Payer: 59 | Source: Ambulatory Visit | Attending: Internal Medicine | Admitting: Internal Medicine

## 2022-03-30 ENCOUNTER — Ambulatory Visit: Payer: 59

## 2022-03-30 DIAGNOSIS — N644 Mastodynia: Secondary | ICD-10-CM | POA: Diagnosis not present

## 2022-03-30 DIAGNOSIS — M5416 Radiculopathy, lumbar region: Secondary | ICD-10-CM | POA: Diagnosis not present

## 2022-03-30 DIAGNOSIS — N6310 Unspecified lump in the right breast, unspecified quadrant: Secondary | ICD-10-CM

## 2022-04-02 DIAGNOSIS — M545 Low back pain, unspecified: Secondary | ICD-10-CM | POA: Diagnosis not present

## 2022-04-03 ENCOUNTER — Ambulatory Visit (HOSPITAL_COMMUNITY): Payer: 59 | Admitting: Licensed Clinical Social Worker

## 2022-04-09 DIAGNOSIS — M545 Low back pain, unspecified: Secondary | ICD-10-CM | POA: Diagnosis not present

## 2022-04-10 ENCOUNTER — Encounter (HOSPITAL_COMMUNITY): Payer: Self-pay | Admitting: Licensed Clinical Social Worker

## 2022-04-10 ENCOUNTER — Ambulatory Visit (INDEPENDENT_AMBULATORY_CARE_PROVIDER_SITE_OTHER): Payer: 59 | Admitting: Licensed Clinical Social Worker

## 2022-04-10 DIAGNOSIS — F3131 Bipolar disorder, current episode depressed, mild: Secondary | ICD-10-CM
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
## 2022-04-12 ENCOUNTER — Telehealth (HOSPITAL_COMMUNITY): Payer: Medicare Other | Admitting: Psychiatry

## 2022-04-14 DIAGNOSIS — M5416 Radiculopathy, lumbar region: Secondary | ICD-10-CM | POA: Diagnosis not present

## 2022-04-16 DIAGNOSIS — M545 Low back pain, unspecified: Secondary | ICD-10-CM | POA: Diagnosis not present

## 2022-04-17 ENCOUNTER — Encounter (HOSPITAL_COMMUNITY): Payer: Self-pay | Admitting: Licensed Clinical Social Worker

## 2022-04-17 ENCOUNTER — Ambulatory Visit (INDEPENDENT_AMBULATORY_CARE_PROVIDER_SITE_OTHER): Payer: 59 | Admitting: Licensed Clinical Social Worker

## 2022-04-17 DIAGNOSIS — F3131 Bipolar disorder, current episode depressed, mild: Secondary | ICD-10-CM

## 2022-04-17 NOTE — Progress Notes (Signed)
   THERAPIST PROGRESS NOTE  Session Time: 8:00am-8:40am  Participation Level: Active  Behavioral Response: Well GroomedAlertDepressed and Irritable  Type of Therapy: Individual Therapy  Treatment Goals addressed: Reduce frequency, intensity, and duration of depression symptoms so that daily functioning is improved     ProgressTowards Goals: Progressing  Interventions: CBT  Summary: Jordan [name] is a 42 y.o. female who presents with Bipolar I, depressed, mild.   Suicidal/Homicidal: Nowithout intent/plan  Therapist Response: Chareese engaged well in individual session with clinician. Clinician utilized CBT to process thoughts, feelings, and interactions. Clinician explored updates with health and noted recent appt with back dr, which identified possible need for surgery. Clinician discussed thoughts and feelings, as well as plan to continue with injections and PT to see what can be done. Clinician encouraged Makala to do what she can do, but to not wait so long as to incur increased injury. Clinician processed break up and current status of relationship with ex. Clinician reflected frustration and irritability due to ex's back and forth drama. Clinician explored Casi's reactions and ability to cope. Clinician noted stress has brought tears, but not aggression. Machell shared some sense of fear that ex would be unsafe around her. Clinician encouraged Shanquetta to trust her instinct.   Plan: Return again in 1 weeks.  Diagnosis: Bipolar 1 disorder, depressed, mild ([place])  Collaboration of Care: Patient refused AEB none required in this session  Patient/Guardian was advised Release of Information must be obtained prior to any record release in order to collaborate their care with an outside provider. Patient/Guardian was advised if they have not already done so to contact the registration department to sign all necessary forms in order for [place] to release information regarding their care.    Consent: Patient/Guardian gives verbal consent for treatment and assignment of benefits for services provided during this visit. Patient/Guardian expressed understanding and agreed to proceed.   [name] [place], LCSW 04/17/2022

## 2022-04-18 ENCOUNTER — Telehealth (HOSPITAL_COMMUNITY): Payer: Medicare Other | Admitting: Psychiatry

## 2022-04-20 DIAGNOSIS — M545 Low back pain, unspecified: Secondary | ICD-10-CM | POA: Diagnosis not present

## 2022-04-23 DIAGNOSIS — M5416 Radiculopathy, lumbar region: Secondary | ICD-10-CM | POA: Diagnosis not present

## 2022-04-24 DIAGNOSIS — M545 Low back pain, unspecified: Secondary | ICD-10-CM | POA: Diagnosis not present

## 2022-04-25 ENCOUNTER — Ambulatory Visit (HOSPITAL_COMMUNITY): Payer: 59 | Admitting: Licensed Clinical Social Worker

## 2022-05-01 ENCOUNTER — Ambulatory Visit (INDEPENDENT_AMBULATORY_CARE_PROVIDER_SITE_OTHER): Payer: 59 | Admitting: Licensed Clinical Social Worker

## 2022-05-01 DIAGNOSIS — F3131 Bipolar disorder, current episode depressed, mild: Secondary | ICD-10-CM | POA: Diagnosis not present

## 2022-05-01 DIAGNOSIS — M545 Low back pain, unspecified: Secondary | ICD-10-CM | POA: Diagnosis not present

## 2022-05-02 NOTE — Progress Notes (Signed)
   THERAPIST PROGRESS NOTE  Session Time: 8:00am-8:50am  Participation Level: Active  Behavioral Response: Well GroomedAlertIrritable  Type of Therapy: Individual Therapy  Treatment Goals addressed: Reduce frequency, intensity, and duration of depression symptoms so that daily functioning is improved     ProgressTowards Goals: Progressing  Interventions: CBT  Summary: Jordan [name] is a 42 y.o. female who presents with Bipolar I depressed, mild.   Suicidal/Homicidal: Nowithout intent/plan  Therapist Response: Meenu engaged well in session with clinician. Clinician utilized CBT to process thoughts, feelings, and interactions with friends and family. 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 Clinician explored the concerns about finances, as well as concern for her health. Clinician noted that making the correct decision about her health will lead to better outcomes for working in the future. Clinician explored relationship with partner and noted that there are still many things to address before they are back on the right track. [name] shared that her cousin died and she will be going to the funeral over the weekend with sister. Clinician processed coping skills for the loss, as well as dealing with sister's behavior.   Plan: Return again in 1-2 weeks.  Diagnosis: Bipolar 1 disorder, depressed, mild ([place])  Collaboration of Care: Patient refused AEB none required  Patient/Guardian was advised Release of Information must be obtained prior to any record release in order to collaborate their care with an outside provider. Patient/Guardian was advised if they have not already done so to contact the registration department to sign all necessary forms in order for [place] to release information regarding their care.   Consent: Patient/Guardian gives verbal consent for treatment and assignment of benefits for services provided during  this visit. Patient/Guardian expressed understanding and agreed to proceed.   [name] [place], LCSW 05/02/2022

## 2022-05-03 DIAGNOSIS — M5416 Radiculopathy, lumbar region: Secondary | ICD-10-CM | POA: Diagnosis not present

## 2022-05-08 ENCOUNTER — Encounter (HOSPITAL_COMMUNITY): Payer: Self-pay | Admitting: Licensed Clinical Social Worker

## 2022-05-08 ENCOUNTER — Ambulatory Visit (INDEPENDENT_AMBULATORY_CARE_PROVIDER_SITE_OTHER): Payer: 59 | Admitting: Licensed Clinical Social Worker

## 2022-05-08 DIAGNOSIS — F3131 Bipolar disorder, current episode depressed, mild: Secondary | ICD-10-CM

## 2022-05-08 DIAGNOSIS — M545 Low back pain, unspecified: Secondary | ICD-10-CM | POA: Diagnosis not present

## 2022-05-08 NOTE — Progress Notes (Signed)
   THERAPIST PROGRESS NOTE  Session Time: 8:10am-8:55am  Participation Level: Active  Behavioral Response: Well GroomedAlertEuthymic  Type of Therapy: Individual Therapy  Treatment Goals addressed: Reduce frequency, intensity, and duration of depression symptoms so that daily functioning is improved     ProgressTowards Goals: Progressing  Interventions: CBT  Summary: Jordan [name] is a 42 y.o. female who presents with Bipolar I Depressed, mild.   Suicidal/Homicidal: Nowithout intent/plan  Therapist Response: [name] engaged well in individual session with clinician. Clinician utilized CBT to process thoughts, feelings,and interactions. [name] shared updates in her household, health, and with family members. Clinician explored changes in dynamics with partner and noted that they are back together and partner is really trying to do the right thing. Clinician discussed [name]'s willingness to give it another try and to see how things go. Clinician discussed [name]'s approach to the relationship, which will likely include some boundaries and protective action for her emotional health. Clinician explored [name]'s thoughts and feelings about decisions made by family members that do and do not affect [name]. Clinician encouraged use of coping skills, boundaries, and making her own decisions without guilt.   Plan: Return again in 2 weeks.  Diagnosis: Bipolar 1 disorder, depressed, mild ([name])  Collaboration of Care: Patient refused AEB none required in this session  Patient/Guardian was advised Release of Information must be obtained prior to any record release in order to collaborate their care with an outside provider. Patient/Guardian was advised if they have not already done so to contact the registration department to sign all necessary forms in order for [place] to release information regarding their care.   Consent: Patient/Guardian gives verbal consent for treatment and assignment of  benefits for services provided during this visit. Patient/Guardian expressed understanding and agreed to proceed.   [name] [place], LCSW 05/08/2022

## 2022-05-12 ENCOUNTER — Other Ambulatory Visit: Payer: Self-pay | Admitting: Obstetrics and Gynecology

## 2022-05-12 DIAGNOSIS — N920 Excessive and frequent menstruation with regular cycle: Secondary | ICD-10-CM

## 2022-05-14 DIAGNOSIS — M545 Low back pain, unspecified: Secondary | ICD-10-CM | POA: Diagnosis not present

## 2022-05-15 DIAGNOSIS — M545 Low back pain, unspecified: Secondary | ICD-10-CM | POA: Diagnosis not present

## 2022-05-17 DIAGNOSIS — M545 Low back pain, unspecified: Secondary | ICD-10-CM | POA: Diagnosis not present

## 2022-05-21 ENCOUNTER — Ambulatory Visit
Admission: EM | Admit: 2022-05-21 | Discharge: 2022-05-21 | Disposition: A | Discharge status: Home / Self Care | Payer: 59 | Attending: Physician Assistant | Admitting: Physician Assistant

## 2022-05-21 ENCOUNTER — Ambulatory Visit (HOSPITAL_COMMUNITY): Payer: Self-pay

## 2022-05-21 DIAGNOSIS — H1031 Unspecified acute conjunctivitis, right eye: Secondary | ICD-10-CM | POA: Diagnosis not present

## 2022-05-21 MED ORDER — POLYMYXIN B-TRIMETHOPRIM 10000-0.1 UNIT/ML-% OP SOLN: 1.0000 [drp] | OPHTHALMIC | 0 refills | Status: DC

## 2022-05-21 NOTE — ED Triage Notes (Signed)
Pt reports right eye problem x 3 days.

## 2022-05-21 NOTE — Discharge Instructions (Signed)
Advised to use the Polytrim eyedrops, 1 drop in the eye every 6 hours on a regular basis until infection clears.  Advised to wash hands frequently as this can be passed from 1 eye to the other.  Advised follow-up PCP return to urgent care as needed.

## 2022-05-21 NOTE — ED Provider Notes (Signed)
[facility]    CSN: [id number] Arrival date & time: 05/21/22  1538      History   Chief Complaint Chief Complaint  Patient presents with   Eye Problem    HPI Jordan [name] is a 42 y.o. female.   42 year old female presents with right redness.  Patient indicates for the past 3 days she has been having persistent and progressive right redness, irritation, discomfort.  Patient indicates that it feels like "sand is about".  Patient indicates she does have mild sensitivity to light.  She relates she woke up this morning and the right eye was mattered shut.  She is concerned that she may have a corneal abrasion present as this happened the last time her eye was red.  She indicates that she has a CPAP machine and sometimes the mask would not fit exactly appropriately and her left eye has been scratched before messing with it at night when she is sleep.  She indicates her vision is normal, she has not traumatized or gotten anything in the right eye that she recalls.  She is without fever or chills.   Eye Problem Associated symptoms: redness (right eye)     Past Medical History:  Diagnosis Date   Alcohol abuse    Allergic rhinitis    pt takes flonase for episodes, no episodes in 2012   Anemia    Anxiety    Basal cell carcinoma    recurrent in left maxillary, has had 5 surguries, last one 2003   Basal cell nevus syndrome    dx age 65   Bipolar 1 disorder    BPPV (benign paroxysmal positional vertigo) 03/01/2016   Candidiasis, vagina    recurrent, pt has made lifestyle modifications and none recently (as of 8/12)   Cervicitis 03/19/2019   Cyst of nasal sinus    Depression    B-Polar   Fibroma    left ovarian   Fibromyalgia    Gardnerella infection    + in 08/08   GERD (gastroesophageal reflux disease)    occ   Hematoma, subungual, finger, right, initial encounter 01/23/2021   Herniated disc    Hyperlipidemia    Joint pain    Lactose intolerance    Obesity     Ovarian cyst 08/23/2020   OVARIAN CYSTECTOMY, HX OF 03/01/2006   ovarian fibroma-left 1996, L ovary and fallopian tube removed     Paronychia of third finger of left hand 07/28/2010   PLANTAR FASCIITIS, BILATERAL 05/24/2009   Pre-diabetes    PTSD (post-traumatic stress disorder)    [place]    hx of herpetic requiring I+D,( was bitten by autistic child that cares fr    Patient Active Problem List   Diagnosis Date Noted   Breast mass, right 03/14/2022   Wound dehiscence 03/14/2022   Routine screening for STI (sexually transmitted infection) 12/27/2021   Reactive airway disease 11/29/2021   Seasonal allergic conjunctivitis 05/31/2021   Venous insufficiency 05/31/2021   Nondisplaced fracture of distal phalanx of right ring finger 02/16/2021   Preventative health care 10/03/2020   Fibroids, submucosal 08/23/2020   Fatigue 08/11/2020   Lipoma of skin and subcutaneous tissue of neck 08/11/2020   Patellar subluxation, left, initial encounter 05/23/2020   Hyperlipidemia 05/23/2020   Iron deficiency 10/22/2019   Menorrhagia with regular cycle 02/09/2019   Post traumatic stress disorder (PTSD) 11/09/2015   Bipolar disorder 07/04/2015   Fibromyalgia 06/26/2010   Obesity 01/17/2010    Class: Chronic  Breast pain, left 07/12/2008   Lumbar disc herniation of L5-S1 on the left side (MRI 2012 s/p surgery)  02/26/2008   Gastroesophageal reflux disease 03/04/2006   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    Past Surgical History:  Procedure Laterality Date   CHOLECYSTECTOMY  1999   colonscopy  07/2020   f/u in 5 yrs   HYSTEROSCOPY N/A 10/04/2020   Procedure: DIAGNOSTIC HYSTEROSCOPY;  Surgeon: Griffin Basil, MD;  Location: [facility];  Service: Gynecology;  Laterality: N/A;   LEFT OOPHORECTOMY     age 6   LIPOMA EXCISION Left 03/02/2021   Procedure: [id number] EXCISION LIPOMA;  Surgeon: Greer Pickerel, MD;  [facility];  Service: General;   Laterality: Left;   LUMBAR LAMINECTOMY/DECOMPRESSION MICRODISCECTOMY N/A 08/14/2012   Procedure: LUMBAR LAMINECTOMY/DECOMPRESSION MICRODISCECTOMY Lumbar 5 -sacrum 1 decompression;  Surgeon: Sinclair Ship, MD;  Location: [place];  Service: Orthopedics;  Laterality: N/A;  Lumbar 5 -sacrum 1 decompression   MANDIBLE RECONSTRUCTION  1992   upper anfd lower jaw cyst   NASAL SINUS SURGERY     X 2 at age 10 & 36    OB History     Gravida  0   Para  0   Term  0   Preterm  0   AB  0   Living  0      SAB  0   IAB  0   Ectopic  0   Multiple  0   Live Births               Home Medications    Prior to Admission medications   Medication Sig Start Date End Date Taking? Authorizing Provider  beclomethasone (QVAR REDIHALER) 40 MCG/ACT inhaler Inhale 2 puffs into the lungs 2 (two) times daily. 12/05/21  Yes Velna Ochs, MD  diclofenac Sodium (VOLTAREN) 1 % GEL APPLY 4 G TOPICALLY 4 TIMES DAILY 08/09/20  Yes Velna Ochs, MD  divalproex (DEPAKOTE ER) 500 MG 24 hr tablet Take 2 tablets (1,000 mg total) by mouth daily. 02/28/22  Yes Arfeen, Arlyce Harman, MD  fluticasone (FLONASE) 50 MCG/ACT nasal spray Place 1 spray into both nostrils daily. 11/29/21  Yes Velna Ochs, MD  hydrOXYzine (ATARAX) 10 MG tablet Take 1 tablet (10 mg total) by mouth daily as needed for anxiety. 02/28/22  Yes Arfeen, Arlyce Harman, MD  Multiple Vitamins-Minerals (ADULT ONE DAILY GUMMIES PO) Take 2 tablets by mouth daily.   Yes [provider]  pregabalin (LYRICA) 150 MG capsule Take 1 capsule (150 mg total) by mouth 2 (two) times daily. 01/05/22  Yes Velna Ochs, MD  trimethoprim-polymyxin b (POLYTRIM) ophthalmic solution Place 1 drop into the right eye every 4 (four) hours. 05/21/22  Yes Nyoka Lint, PA-C    Family History Family History  Problem Relation Age of Onset   Bipolar disorder Mother    Alcohol abuse Mother    Drug abuse Mother    Other Mother        DDD   Hypertension  Mother    Hyperlipidemia Mother    Depression Mother    Anxiety disorder Mother    Alcoholism Mother    Colon polyps Mother    Bipolar disorder Father    Other Father        basal cell nevus syndrome   Depression Father    Drug abuse Sister    Colon polyps Sister    Drug abuse Brother    Other  Brother        basal cell syndrome   Breast cancer Maternal Aunt    Breast cancer Maternal Aunt    Diabetes Maternal Grandmother    Heart attack Maternal Grandmother    Cancer Maternal Grandmother        stomach   Stomach cancer Other        uncle   Cancer Other        unknown grandfather   Diabetes Other        grandmother, aunt and 2 uncles   Colon cancer Other        cousin at age 93    Social History Social History   Tobacco Use   Smoking status: Never   Smokeless tobacco: Never  Vaping Use   Vaping Use: Never used  Substance Use Topics   Alcohol use: Yes    Alcohol/week: 0.0 standard drinks of alcohol    Comment: socially    Drug use: No     Allergies   Patient has no known allergies.   Review of Systems Review of Systems  Eyes:  Positive for redness (right eye).     Physical Exam Triage Vital Signs ED Triage Vitals [05/21/22 1557]  Enc Vitals Group     BP 102/63     Pulse Rate 66     Resp 16     Temp 98.8 F (37.1 C)     Temp Source Oral     SpO2 98 %     Weight      Height      Head Circumference      Peak Flow      Pain Score      Pain Loc      Pain Edu?      Excl. in [place]?    No data found.  Updated Vital Signs BP 102/63 (BP Location: Right Arm)   Pulse 66   Temp 98.8 F (37.1 C) (Oral)   Resp 16   LMP 05/10/2022   SpO2 98%   Visual Acuity Right Eye Distance:   Left Eye Distance:   Bilateral Distance:    Right Eye Near:   Left Eye Near:    Bilateral Near:     Physical Exam Constitutional:      Appearance: Normal appearance.  Eyes:     General: Lids are normal. Lids are everted, no foreign bodies appreciated.      Extraocular Movements: Extraocular movements intact.      Comments: Right eye: Conjunctiva with mild redness present, purulent drainage.  Flora stain: No evidence of foreign body or corneal abrasion is noted.  Stain irrigated with normal saline, no problems.  Neurological:     Mental Status: She is alert.      UC Treatments / Results  Labs (all labs ordered are listed, but only abnormal results are displayed) Labs Reviewed - No data to display  EKG   Radiology No results found.  Procedures Procedures (including critical care time)  Medications Ordered in UC Medications - No data to display  Initial Impression / Assessment and Plan / UC Course  I have reviewed the triage vital signs and the nursing notes.  Pertinent labs & imaging results that were available during my care of the patient were reviewed by me and considered in my medical decision making (see chart for details).    Plan: The diagnosis to be treated with the following: 1.  Conjunctivitis: A.  Polytrim ophthalmic solution, 1 drop  in the eye 4 times a day until condition clears. 2.  Advised follow-up PCP return to urgent care as needed. Final Clinical Impressions(s) / UC Diagnoses   Final diagnoses:  Acute bacterial conjunctivitis of right eye     Discharge Instructions      Advised to use the Polytrim eyedrops, 1 drop in the eye every 6 hours on a regular basis until infection clears.  Advised to wash hands frequently as this can be passed from 1 eye to the other.  Advised follow-up PCP return to urgent care as needed.    ED Prescriptions     Medication Sig Dispense Auth. Provider   trimethoprim-polymyxin b (POLYTRIM) ophthalmic solution Place 1 drop into the right eye every 4 (four) hours. 10 mL Nyoka Lint, PA-C      PDMP not reviewed this encounter.   Nyoka Lint, PA-C 05/21/22 [phone]

## 2022-05-22 DIAGNOSIS — M545 Low back pain, unspecified: Secondary | ICD-10-CM | POA: Diagnosis not present

## 2022-05-23 ENCOUNTER — Encounter: Payer: 59 | Admitting: Student

## 2022-05-24 ENCOUNTER — Ambulatory Visit (INDEPENDENT_AMBULATORY_CARE_PROVIDER_SITE_OTHER): Payer: 59 | Admitting: Licensed Clinical Social Worker

## 2022-05-24 ENCOUNTER — Encounter (HOSPITAL_COMMUNITY): Payer: Self-pay | Admitting: Licensed Clinical Social Worker

## 2022-05-24 DIAGNOSIS — M545 Low back pain, unspecified: Secondary | ICD-10-CM | POA: Diagnosis not present

## 2022-05-24 DIAGNOSIS — F3131 Bipolar disorder, current episode depressed, mild: Secondary | ICD-10-CM | POA: Diagnosis not present

## 2022-05-24 NOTE — Progress Notes (Signed)
Virtual Visit via Video Note  I connected with Jordan [name] on 05/24/22 at  8:00 AM EDT by a video enabled telemedicine application and verified that I am speaking with the correct person using two identifiers.  Location: Patient: home Provider: home office   I discussed the limitations of evaluation and management by telemedicine and the availability of in person appointments. The patient expressed understanding and agreed to proceed.   I discussed the assessment and treatment plan with the patient. The patient was provided an opportunity to ask questions and all were answered. The patient agreed with the plan and demonstrated an understanding of the instructions.   The patient was advised to call back or seek an in-person evaluation if the symptoms worsen or if the condition fails to improve as anticipated.  I provided 45 minutes of non-face-to-face time during this encounter.   [name], LCSW   THERAPIST PROGRESS NOTE  Session Time: 8:00am-8:45am  Participation Level: Active  Behavioral Response: CasualAlertDepressed  Type of Therapy: Individual Therapy  Treatment Goals addressed: Reduce frequency, intensity, and duration of depression symptoms so that daily functioning is improved     ProgressTowards Goals: Progressing  Interventions: CBT  Summary: Jordan [name] is a 42 y.o. female who presents with Bipolar I depressed, mild.   Suicidal/Homicidal: Nowithout intent/plan  Therapist Response: Jordan [name] engaged well in virtual session with Pension scheme manager. Clinician utilized CBT to process thoughts, feelings, and interactions with friends, family, and partner. Clinician processed interactions with family members, particularly sister over the Easter holiday. Clinician noted the value of Marlenne's ongoing strengthening boundaries with her sister, when sister is showing off in public. Clinician discussed relationship with partner and noted that things are going pretty well,  but there are still some areas that need work. Clinician discussed the importance of communication, self care, and time together to understand each other's perspectives. Clinician reminded Jordan [name] that she has been doing many years of work on herself and her partner has not. Jordan [name] shared that she has stopped working with 1:1 and she is going to take the next month to work on her strength, physical therapy, and to improve her pain.   Plan: Return again in 1-2 weeks.  Diagnosis: Bipolar 1 disorder, depressed, mild  Collaboration of Care: Patient refused AEB none required  Patient/Guardian was advised Release of Information must be obtained prior to any record release in order to collaborate their care with an outside provider. Patient/Guardian was advised if they have not already done so to contact the registration department to sign all necessary forms in order for [place] to release information regarding their care.   Consent: Patient/Guardian gives verbal consent for treatment and assignment of benefits for services provided during this visit. Patient/Guardian expressed understanding and agreed to proceed.   [place], LCSW 05/24/2022

## 2022-05-29 ENCOUNTER — Ambulatory Visit (INDEPENDENT_AMBULATORY_CARE_PROVIDER_SITE_OTHER): Payer: 59 | Admitting: Licensed Clinical Social Worker

## 2022-05-29 ENCOUNTER — Encounter (HOSPITAL_COMMUNITY): Payer: Self-pay | Admitting: Licensed Clinical Social Worker

## 2022-05-29 DIAGNOSIS — F3131 Bipolar disorder, current episode depressed, mild: Secondary | ICD-10-CM | POA: Diagnosis not present

## 2022-05-29 DIAGNOSIS — M545 Low back pain, unspecified: Secondary | ICD-10-CM | POA: Diagnosis not present

## 2022-05-29 NOTE — Progress Notes (Signed)
   THERAPIST PROGRESS NOTE  Session Time: 8:00am-8:45am  Participation Level: Active  Behavioral Response: Well GroomedAlertEuthymic  Type of Therapy: Individual Therapy  Treatment Goals addressed: Reduce frequency, intensity, and duration of depression symptoms so that daily functioning is improved    ProgressTowards Goals: Progressing  Interventions: Motivational Interviewing  Summary: Jordan [name] is a 42 y.o. female who presents with Bipolar I, depressed, mild.   Suicidal/Homicidal: Nowithout intent/plan  Therapist Response: Ahriana engaged well in session with clinician. Clinician utilized MI OARS to reflect and summarize thoughts,feelings, and interactions with friends, family, and partner. Clinician explored updates in relationship with partner and processed interaction patterns. Clinician reflected the ways that Jordan [name] has been able to read her partner's mood and reaction to help stabilize and support her. Clinician processed the value of Jordan [name] taking time off to heal her physical body and work on her strength. Clinician discussed the changes in attitude.   Plan: Return again in 1 weeks.  Diagnosis: Bipolar 1 disorder, depressed, mild  Collaboration of Care: Patient refused AEB none required  Patient/Guardian was advised Release of Information must be obtained prior to any record release in order to collaborate their care with an outside provider. Patient/Guardian was advised if they have not already done so to contact the registration department to sign all necessary forms in order for [place] to release information regarding their care.   Consent: Patient/Guardian gives verbal consent for treatment and assignment of benefits for services provided during this visit. Patient/Guardian expressed understanding and agreed to proceed.   [name] For[place], LCSW 05/29/2022

## 2022-05-30 ENCOUNTER — Telehealth (HOSPITAL_COMMUNITY): Payer: Medicare Other | Admitting: Psychiatry

## 2022-05-31 DIAGNOSIS — M545 Low back pain, unspecified: Secondary | ICD-10-CM | POA: Diagnosis not present

## 2022-06-04 ENCOUNTER — Encounter (HOSPITAL_COMMUNITY): Payer: Self-pay | Admitting: Psychiatry

## 2022-06-04 ENCOUNTER — Telehealth (HOSPITAL_BASED_OUTPATIENT_CLINIC_OR_DEPARTMENT_OTHER): Payer: 59 | Admitting: Psychiatry

## 2022-06-04 DIAGNOSIS — F4310 Post-traumatic stress disorder, unspecified: Secondary | ICD-10-CM | POA: Diagnosis not present

## 2022-06-04 DIAGNOSIS — F419 Anxiety disorder, unspecified: Secondary | ICD-10-CM | POA: Diagnosis not present

## 2022-06-04 DIAGNOSIS — F3132 Bipolar disorder, current episode depressed, moderate: Secondary | ICD-10-CM | POA: Diagnosis not present

## 2022-06-04 MED ORDER — HYDROXYZINE HCL 10 MG PO TABS: 20.0000 mg | ORAL_TABLET | Freq: Every day | ORAL | 2 refills | Status: DC | PRN

## 2022-06-04 MED ORDER — DIVALPROEX SODIUM ER 500 MG PO TB24: 1000.0000 mg | ORAL_TABLET | Freq: Every day | ORAL | 2 refills | Status: DC
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
## 2022-06-05 ENCOUNTER — Ambulatory Visit (INDEPENDENT_AMBULATORY_CARE_PROVIDER_SITE_OTHER): Payer: 59 | Admitting: Licensed Clinical Social Worker

## 2022-06-05 DIAGNOSIS — M545 Low back pain, unspecified: Secondary | ICD-10-CM | POA: Diagnosis not present

## 2022-06-05 DIAGNOSIS — F3131 Bipolar disorder, current episode depressed, mild: Secondary | ICD-10-CM | POA: Diagnosis not present

## 2022-06-07 ENCOUNTER — Encounter (HOSPITAL_COMMUNITY): Payer: Self-pay | Admitting: Licensed Clinical Social Worker

## 2022-06-07 DIAGNOSIS — M545 Low back pain, unspecified: Secondary | ICD-10-CM | POA: Diagnosis not present

## 2022-06-07 NOTE — Progress Notes (Signed)
   THERAPIST PROGRESS NOTE  Session Time: 9:00am-9:45am  Participation Level: Active  Behavioral Response: Well GroomedAlertDepressed and Irritable  Type of Therapy: Individual Therapy  Treatment Goals addressed:  Reduce frequency, intensity, and duration of depression symptoms so that daily functioning is improved    ProgressTowards Goals: Progressing  Interventions: CBT  Summary: MALAINA MORTELLARO is a 43 y.o. female who presents with Bipolar I, depressed, mild.   Suicidal/Homicidal: Nowithout intent/plan  Therapist Response: Helon engaged well in individual session. Clinician utilized CBT to process thoughts, feelings, and interactions. Vinaya shared frustration and ongoing challenges in her relationship. Clinician provided time and space for Lauralee to share her concerns, and noted that Falen has made significant efforts to communicate with her partner about her expectations, her struggles, and what she needs. Clinician explored Lonetta's ongoing willingness to remain in the relationship, despite the challenges. Clinician also reviewed Marchia's use of coping skills.   Plan: Return again in 1-2 weeks.  Diagnosis: Bipolar 1 disorder, depressed, mild  Collaboration of Care: Psychiatrist AEB reviewed note from most recent visit  Patient/Guardian was advised Release of Information must be obtained prior to any record release in order to collaborate their care with an outside provider. Patient/Guardian was advised if they have not already done so to contact the registration department to sign all necessary forms in order for [place] to release information regarding their care.   Consent: Patient/Guardian gives verbal consent for treatment and assignment of benefits for services provided during this visit. Patient/Guardian expressed understanding and agreed to proceed.   [name] [place], LCSW 06/07/2022

## 2022-06-12 ENCOUNTER — Encounter (HOSPITAL_COMMUNITY): Payer: Self-pay | Admitting: Licensed Clinical Social Worker

## 2022-06-12 ENCOUNTER — Ambulatory Visit (INDEPENDENT_AMBULATORY_CARE_PROVIDER_SITE_OTHER): Payer: 59 | Admitting: Licensed Clinical Social Worker

## 2022-06-12 DIAGNOSIS — F3131 Bipolar disorder, current episode depressed, mild: Secondary | ICD-10-CM

## 2022-06-12 NOTE — Progress Notes (Signed)
   THERAPIST PROGRESS NOTE  Session Time: 8:00am-8:45am  Participation Level: Active  Behavioral Response: Well GroomedAlertEuthymic  Type of Therapy: Individual Therapy  Treatment Goals addressed: Reduce frequency, intensity, and duration of depression symptoms so that daily functioning is improved    ProgressTowards Goals: Progressing  Interventions: CBT  Summary: Jordan [name] is a 42 y.o. female who presents with Bipolar I, depressed, mild.   Suicidal/Homicidal: Nowithout intent/plan  Therapist Response: Dymond engaged well in individual session with clinician. Clinician processed interactions and events of the past week using CBT. Clinician processed the change in [place] as she experiencing stressful interactions and experiences with friends and family. Clinician noted that her ability to maintain her calm has been extremely helpful in coping with others who are not calm. Clinician also reflected Thyra's ability to identify other people's issues without getting offended or blowing up, even when she feels very triggered. Clinician reviewed coping skills and identified ways to demonstrate appropriate coping to others.   Plan: Return again in 1 weeks.  Diagnosis: Bipolar 1 disorder, depressed, mild  Collaboration of Care: Psychiatrist AEB reviewed note from Dr. Lolly Mustache last week  Patient/Guardian was advised Release of Information must be obtained prior to any record release in order to collaborate their care with an outside provider. Patient/Guardian was advised if they have not already done so to contact the registration department to sign all necessary forms in order for [place] to release information regarding their care.   Consent: Patient/Guardian gives verbal consent for treatment and assignment of benefits for services provided during this visit. Patient/Guardian expressed understanding and agreed to proceed.   [name] [place], LCSW 06/12/2022

## 2022-06-13 ENCOUNTER — Encounter: Payer: 59 | Admitting: Internal Medicine

## 2022-06-19 DIAGNOSIS — M545 Low back pain, unspecified: Secondary | ICD-10-CM | POA: Diagnosis not present

## 2022-06-20 ENCOUNTER — Ambulatory Visit (INDEPENDENT_AMBULATORY_CARE_PROVIDER_SITE_OTHER): Payer: 59 | Admitting: Licensed Clinical Social Worker

## 2022-06-20 DIAGNOSIS — F3131 Bipolar disorder, current episode depressed, mild: Secondary | ICD-10-CM

## 2022-06-21 DIAGNOSIS — M545 Low back pain, unspecified: Secondary | ICD-10-CM | POA: Diagnosis not present

## 2022-06-24 ENCOUNTER — Encounter (HOSPITAL_COMMUNITY): Payer: Self-pay | Admitting: Licensed Clinical Social Worker

## 2022-06-24 NOTE — Progress Notes (Signed)
Virtual Visit via Video Note  I connected with Jordan [name] on 06/24/22 at  8:00 AM EDT by a video enabled telemedicine application and verified that I am speaking with the correct person using two identifiers.  Location: Patient: home/car Provider: home office   I discussed the limitations of evaluation and management by telemedicine and the availability of in person appointments. The patient expressed understanding and agreed to proceed.   I discussed the assessment and treatment plan with the patient. The patient was provided an opportunity to ask questions and all were answered. The patient agreed with the plan and demonstrated an understanding of the instructions.   The patient was advised to call back or seek an in-person evaluation if the symptoms worsen or if the condition fails to improve as anticipated.  I provided 45 minutes of non-face-to-face time during this encounter.   Jordan Melter, LCSW   THERAPIST PROGRESS NOTE  Session Time: 8:15am-9:00am  Participation Level: Active  Behavioral Response: NeatAlertEuthymic  Type of Therapy: Individual Therapy  Treatment Goals addressed: Reduce frequency, intensity, and duration of depression symptoms so that daily functioning is improved    ProgressTowards Goals: Progressing  Interventions: CBT  Summary: Jordan [name] is a 42 y.o. female who presents with Bipolar I, depressed, mild  Suicidal/Homicidal: Nowithout intent/plan  Therapist Response: Jordan [name] engaged well in individual session with clinician. Clinician utilized CBT to process thoughts, feelings, and interactions. Clinician discussed interactions with partner and updates in the household. Clinician explored physical health and how pain may interact with mood. 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 Clinician identified the importance of Jordan [name] taking  this time for self-care and to get her body and mind healthy. Clinician discussed the body's response to stress, chemically, physically, and emotionally. Clinician utilized CBT to remind Jordan [name] that choosing the thoughts that are attached to the physical reaction is possible and very helpful, so choosing excitement over fear.   Plan: Return again in 1 weeks.  Diagnosis: Bipolar 1 disorder, depressed, mild (HCC)  Collaboration of Care: Patient refused AEB none required  Patient/Guardian was advised Release of Information must be obtained prior to any record release in order to collaborate their care with an outside provider. Patient/Guardian was advised if they have not already done so to contact the registration department to sign all necessary forms in order for [place] to release information regarding their care.   Consent: Patient/Guardian gives verbal consent for treatment and assignment of benefits for services provided during this visit. Patient/Guardian expressed understanding and agreed to proceed.   Jordan Heck [place], LCSW 06/24/2022

## 2022-06-26 ENCOUNTER — Ambulatory Visit (HOSPITAL_COMMUNITY): Payer: 59 | Admitting: Licensed Clinical Social Worker

## 2022-06-27 ENCOUNTER — Encounter (HOSPITAL_COMMUNITY): Payer: Self-pay | Admitting: Licensed Clinical Social Worker

## 2022-06-27 ENCOUNTER — Ambulatory Visit (INDEPENDENT_AMBULATORY_CARE_PROVIDER_SITE_OTHER): Payer: 59 | Admitting: Licensed Clinical Social Worker

## 2022-06-27 DIAGNOSIS — F3131 Bipolar disorder, current episode depressed, mild: Secondary | ICD-10-CM | POA: Diagnosis not present
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
## 2022-06-28 DIAGNOSIS — M545 Low back pain, unspecified: Secondary | ICD-10-CM | POA: Diagnosis not present

## 2022-07-04 DIAGNOSIS — M545 Low back pain, unspecified: Secondary | ICD-10-CM | POA: Diagnosis not present

## 2022-07-05 ENCOUNTER — Encounter (HOSPITAL_COMMUNITY): Payer: Self-pay | Admitting: Licensed Clinical Social Worker

## 2022-07-05 ENCOUNTER — Ambulatory Visit (INDEPENDENT_AMBULATORY_CARE_PROVIDER_SITE_OTHER): Payer: 59 | Admitting: Licensed Clinical Social Worker

## 2022-07-05 DIAGNOSIS — F3131 Bipolar disorder, current episode depressed, mild: Secondary | ICD-10-CM | POA: Diagnosis not present

## 2022-07-05 DIAGNOSIS — M545 Low back pain, unspecified: Secondary | ICD-10-CM | POA: Diagnosis not present

## 2022-07-05 NOTE — Progress Notes (Signed)
Virtual Visit via Video Note  I connected with Jordan [name] on 07/05/22 at  8:00 AM EDT by a video enabled telemedicine application and verified that I am speaking with the correct person using two identifiers.  Location: Patient: home Provider: home office   I discussed the limitations of evaluation and management by telemedicine and the availability of in person appointments. The patient expressed understanding and agreed to proceed.   I discussed the assessment and treatment plan with the patient. The patient was provided an opportunity to ask questions and all were answered. The patient agreed with the plan and demonstrated an understanding of the instructions.   The patient was advised to call back or seek an in-person evaluation if the symptoms worsen or if the condition fails to improve as anticipated.  I provided 45 minutes of non-face-to-face time during this encounter.   Jordan Melter, LCSW   THERAPIST PROGRESS NOTE  Session Time: 8:00am-8:45am  Participation Level: Active  Behavioral Response: Well GroomedAlertIrritable  Type of Therapy: Individual Therapy  Treatment Goals addressed: Reduce frequency, intensity, and duration of depression symptoms so that daily functioning is improved    ProgressTowards Goals: Progressing  Interventions: CBT  Summary: Jordan [name] is a 42 y.o. female who presents with Bipolar I, depressed, mild.   Suicidal/Homicidal: Nowithout intent/plan  Therapist Response: Jordan [name] engaged well in individual virtual session with clinician. Clinician utilized CBT to process thoughts, feelings, and interactions. Clinician processed ongoing stresses about finances, since Jordan [name] has not been working any extra hours and she is taking classes. 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 Clinician identified stress with partner, who is not as understanding about the reduced finances.  Clinician processed ways for Jordan [name] to set her clear boundaries with love and kindness. Clinician also offered a couples session to help with communication and empathy.   Plan: Return again in 1 weeks.  Diagnosis: Bipolar 1 disorder, depressed, mild (HCC)  Collaboration of Care: Patient refused AEB none required  Patient/Guardian was advised Release of Information must be obtained prior to any record release in order to collaborate their care with an outside provider. Patient/Guardian was advised if they have not already done so to contact the registration department to sign all necessary forms in order for [place] to release information regarding their care.   Consent: Patient/Guardian gives verbal consent for treatment and assignment of benefits for services provided during this visit. Patient/Guardian expressed understanding and agreed to proceed.   Jordan [name] [place], LCSW 07/05/2022

## 2022-07-10 ENCOUNTER — Ambulatory Visit (INDEPENDENT_AMBULATORY_CARE_PROVIDER_SITE_OTHER): Payer: 59 | Admitting: Licensed Clinical Social Worker

## 2022-07-10 ENCOUNTER — Other Ambulatory Visit: Payer: Self-pay | Admitting: Internal Medicine

## 2022-07-10 ENCOUNTER — Encounter (HOSPITAL_COMMUNITY): Payer: Self-pay | Admitting: Licensed Clinical Social Worker

## 2022-07-10 DIAGNOSIS — M545 Low back pain, unspecified: Secondary | ICD-10-CM | POA: Diagnosis not present

## 2022-07-10 DIAGNOSIS — F3131 Bipolar disorder, current episode depressed, mild: Secondary | ICD-10-CM | POA: Diagnosis not present

## 2022-07-10 DIAGNOSIS — M797 Fibromyalgia: Secondary | ICD-10-CM

## 2022-07-10 NOTE — Progress Notes (Signed)
Virtual Visit via Video Note  I connected with RESA ANLIKER on 07/10/22 at  8:00 AM EDT by a video enabled telemedicine application and verified that I am speaking with the correct person using two identifiers.  Location: Patient: home Provider: office   I discussed the limitations of evaluation and management by telemedicine and the availability of in person appointments. The patient expressed understanding and agreed to proceed.   I discussed the assessment and treatment plan with the patient. The patient was provided an opportunity to ask questions and all were answered. The patient agreed with the plan and demonstrated an understanding of the instructions.   The patient was advised to call back or seek an in-person evaluation if the symptoms worsen or if the condition fails to improve as anticipated.  I provided 40 minutes of non-face-to-face time during this encounter.   [name], LCSW   THERAPIST PROGRESS NOTE  Session Time: 8:10am-8:50am  Participation Level: Active  Behavioral Response: NeatAlertDepressed  Type of Therapy: Individual Therapy  Treatment Goals addressed:  Reduce frequency, intensity, and duration of depression symptoms so that daily functioning is improved    ProgressTowards Goals: Progressing  Interventions: CBT  Summary: KYRSTEN GONGWER is a 42 y.o. female who presents with Bipolar I, depressed.   Suicidal/Homicidal: Nowithout intent/plan  Therapist Response: Rus[place]helle engaged well in individual virtual session with clinician. Clinician utilized CBT to process thoughts, interactions, and mood. Clinician explored frustration and exhaustion due to physical health and pain levels in back. Clinician noted the impact pain has on mood. Clinician discussed financial issues and concerns, as well as her comfort in sharing her stress with partner. Clinician encouraged Barbee to be more open with partner in order to reduce the stress and find some  relief. Clinician explored updates with school program and how she has been enjoying the program and has been chosen as a representative of her class.   Plan: Return again in 1 weeks.  Diagnosis: Bipolar 1 disorder, depressed, mild (HCC)  Collaboration of Care: Patient refused AEB none required  Patient/Guardian was advised Release of Information must be obtained prior to any record release in order to collaborate their care with an outside provider. Patient/Guardian was advised if they have not already done so to contact the registration department to sign all necessary forms in order for [place] to release information regarding their care.   Consent: Patient/Guardian gives verbal consent for treatment and assignment of benefits for services provided during this visit. Patient/Guardian expressed understanding and agreed to proceed.   [name] Zearing, LCSW 07/10/2022

## 2022-07-11 DIAGNOSIS — M545 Low back pain, unspecified: Secondary | ICD-10-CM | POA: Diagnosis not present

## 2022-07-12 ENCOUNTER — Ambulatory Visit (HOSPITAL_COMMUNITY): Payer: 59 | Admitting: Licensed Clinical Social Worker

## 2022-07-12 NOTE — Telephone Encounter (Signed)
It looks like her orthopedics surgeon prescribed gabapentin in February. Can you clarify which one she is taking? Would recommend she pick either gabapentin or lyrica but not take both. Thank you.

## 2022-07-17 ENCOUNTER — Other Ambulatory Visit: Payer: Self-pay

## 2022-07-17 DIAGNOSIS — M545 Low back pain, unspecified: Secondary | ICD-10-CM | POA: Diagnosis not present

## 2022-07-17 DIAGNOSIS — M797 Fibromyalgia: Secondary | ICD-10-CM

## 2022-07-17 MED ORDER — PREGABALIN 150 MG PO CAPS: 150.0000 mg | ORAL_CAPSULE | Freq: Two times a day (BID) | ORAL | 3 refills | Status: DC

## 2022-07-17 NOTE — Addendum Note (Signed)
Addended by: Cala Bradford on: 07/17/2022 03:47 PM   Modules accepted: Orders

## 2022-07-17 NOTE — Telephone Encounter (Signed)
Please see my last telephone note. Can we clarify this with patient?

## 2022-07-17 NOTE — Telephone Encounter (Signed)
Patient stated she is no longer taking gabapentin, gabapentin was a short term medication. Patient would like pregablin.

## 2022-07-19 ENCOUNTER — Ambulatory Visit (INDEPENDENT_AMBULATORY_CARE_PROVIDER_SITE_OTHER): Payer: 59 | Admitting: Licensed Clinical Social Worker

## 2022-07-19 DIAGNOSIS — M545 Low back pain, unspecified: Secondary | ICD-10-CM | POA: Diagnosis not present

## 2022-07-19 DIAGNOSIS — F3131 Bipolar disorder, current episode depressed, mild: Secondary | ICD-10-CM | POA: Diagnosis not present

## 2022-07-20 ENCOUNTER — Encounter (HOSPITAL_COMMUNITY): Payer: Self-pay | Admitting: Licensed Clinical Social Worker

## 2022-07-20 NOTE — Progress Notes (Signed)
Virtual Visit via Video Note  I connected with Jordan [name] on 07/20/22 at  3:30 PM EDT by a video enabled telemedicine application and verified that I am speaking with the correct person using two identifiers.  Location: Patient: home Provider: home office   I discussed the limitations of evaluation and management by telemedicine and the availability of in person appointments. The patient expressed understanding and agreed to proceed.   I discussed the assessment and treatment plan with the patient. The patient was provided an opportunity to ask questions and all were answered. The patient agreed with the plan and demonstrated an understanding of the instructions.   The patient was advised to call back or seek an in-person evaluation if the symptoms worsen or if the condition fails to improve as anticipated.  I provided 45 minutes of non-face-to-face time during this encounter.   Jordan Melter, LCSW   THERAPIST PROGRESS NOTE  Session Time: 3:30pm-4:15pm  Participation Level: Active  Behavioral Response: Well GroomedAlertEuthymic  Type of Therapy: Individual Therapy  Treatment Goals addressed: Reduce frequency, intensity, and duration of depression symptoms so that daily functioning is improved      ProgressTowards Goals: Progressing  Interventions: CBT  Summary: Jordan [name] is a 42 y.o. female who presents with Bipolar I, depressed.   Suicidal/Homicidal: Nowithout intent/plan  Therapist Response: Jordan [name] engaged well in individual virtual session with clinician. Clinician utilized CBT to process thoughts, feelings, and interactions. Jordan [name] shared updates with relationship, school, work, Catering manager. Facilities manager reflected the positive attitude and the decision to focus on her own personal improvements in life. Clinician identified that taking this time to go back to school, to learn the trade, and to be successful will have a longer lasting impact than worrying about  trivial drama with partner. 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 Clinician identified that she is in the right place at the right time. Jordan [name] reports feeling excited and hopeful for the first time in a long time.   Plan: Return again in 2 weeks.  Diagnosis: Bipolar 1 disorder, depressed, mild (HCC)  Collaboration of Care: Patient refused AEB none required  Patient/Guardian was advised Release of Information must be obtained prior to any record release in order to collaborate their care with an outside provider. Patient/Guardian was advised if they have not already done so to contact the registration department to sign all necessary forms in order for [place] to release information regarding their care.   Consent: Patient/Guardian gives verbal consent for treatment and assignment of benefits for services provided during this visit. Patient/Guardian expressed understanding and agreed to proceed.   Jordan Heck Odin, LCSW 07/20/2022

## 2022-07-24 DIAGNOSIS — M545 Low back pain, unspecified: Secondary | ICD-10-CM | POA: Diagnosis not present

## 2022-07-25 ENCOUNTER — Ambulatory Visit (INDEPENDENT_AMBULATORY_CARE_PROVIDER_SITE_OTHER): Payer: 59 | Admitting: Internal Medicine

## 2022-07-25 ENCOUNTER — Other Ambulatory Visit: Payer: Self-pay

## 2022-07-25 ENCOUNTER — Other Ambulatory Visit (HOSPITAL_COMMUNITY)
Admission: RE | Admit: 2022-07-25 | Discharge: 2022-07-25 | Disposition: A | Discharge status: Home / Self Care | Payer: 59 | Source: Ambulatory Visit | Attending: Internal Medicine | Admitting: Internal Medicine

## 2022-07-25 ENCOUNTER — Encounter: Payer: Self-pay | Admitting: Internal Medicine

## 2022-07-25 ENCOUNTER — Ambulatory Visit (INDEPENDENT_AMBULATORY_CARE_PROVIDER_SITE_OTHER): Payer: 59 | Admitting: Licensed Clinical Social Worker

## 2022-07-25 VITALS — BP 105/48 | HR 65 | Temp 98.1°F | Ht 69.0 in | Wt 294.0 lb

## 2022-07-25 DIAGNOSIS — K219 Gastro-esophageal reflux disease without esophagitis: Secondary | ICD-10-CM | POA: Diagnosis not present

## 2022-07-25 DIAGNOSIS — N760 Acute vaginitis: Secondary | ICD-10-CM | POA: Diagnosis present

## 2022-07-25 DIAGNOSIS — F3131 Bipolar disorder, current episode depressed, mild: Secondary | ICD-10-CM | POA: Diagnosis not present

## 2022-07-25 DIAGNOSIS — Z6836 Body mass index (BMI) 36.0-36.9, adult: Secondary | ICD-10-CM

## 2022-07-25 DIAGNOSIS — J45909 Unspecified asthma, uncomplicated: Secondary | ICD-10-CM | POA: Diagnosis not present

## 2022-07-25 DIAGNOSIS — N644 Mastodynia: Secondary | ICD-10-CM | POA: Diagnosis not present

## 2022-07-25 DIAGNOSIS — F319 Bipolar disorder, unspecified: Secondary | ICD-10-CM

## 2022-07-25 DIAGNOSIS — M5126 Other intervertebral disc displacement, lumbar region: Secondary | ICD-10-CM | POA: Diagnosis not present

## 2022-07-25 DIAGNOSIS — M797 Fibromyalgia: Secondary | ICD-10-CM

## 2022-07-25 DIAGNOSIS — E669 Obesity, unspecified: Secondary | ICD-10-CM

## 2022-07-25 DIAGNOSIS — F4310 Post-traumatic stress disorder, unspecified: Secondary | ICD-10-CM

## 2022-07-25 NOTE — Assessment & Plan Note (Signed)
Patient requested referral to nutritionist for weightloss per PT recommendation, which will also help to alleviate back pain.

## 2022-07-25 NOTE — Assessment & Plan Note (Signed)
Fibromyalgia pain is managed with Lyrica and patient finds relief with medication regimen. Will continue to monitor pain with this regimen.

## 2022-07-25 NOTE — Assessment & Plan Note (Signed)
Patient notes increased vaginal discharge and itchiness over the past week and relays that it feels similarly to BV she has had in the past. Swab has been sent and patient will receive results through myChart.

## 2022-07-25 NOTE — Assessment & Plan Note (Signed)
Patient referred to Dr. Dwain Sarna surgeon to discuss possibility of breast reduction due to worsening back pain, shoulder pain, and bruising/discoloration due to breast weight and pain.
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
## 2022-07-25 NOTE — Assessment & Plan Note (Addendum)
Patient plans to continue with counseling services that she feels have improved her mood. Plans to continue with medication regimen to help with mood. Her psychiatrist has recommended she complete a LFT panel for her medication Depakote, will complete at follow up.

## 2022-07-25 NOTE — Patient Instructions (Addendum)
Hi Ms. Baltes!   It was wonderful seeing you today. We have placed a referral for you to see Dr. Dwain Sarna to discuss a possible breast reduction and a referral to a nutritionist, which you should be contacted regarding soon. We will be sending your swab to the lab and will be contacting you with the results. Feel free to let us know if you have any further questions.   Thank you!

## 2022-07-25 NOTE — Assessment & Plan Note (Addendum)
Symptoms have resolved, no longer requiring her steroid inhaler. Monitor.

## 2022-07-26 LAB — CERVICOVAGINAL ANCILLARY ONLY
Bacterial Vaginitis (gardnerella): POSITIVE — AB
Chlamydia: NEGATIVE
Comment: NEGATIVE
Comment: NEGATIVE
Comment: NEGATIVE
Comment: NEGATIVE
Comment: NEGATIVE
Comment: NORMAL
Neisseria Gonorrhea: NEGATIVE

## 2022-07-27 ENCOUNTER — Other Ambulatory Visit: Payer: Self-pay | Admitting: Internal Medicine

## 2022-07-27 DIAGNOSIS — N760 Acute vaginitis: Secondary | ICD-10-CM

## 2022-07-27 DIAGNOSIS — Z5181 Encounter for therapeutic drug level monitoring: Secondary | ICD-10-CM | POA: Diagnosis not present

## 2022-07-27 MED ORDER — METRONIDAZOLE 500 MG PO TABS
500.0000 mg | ORAL_TABLET | Freq: Two times a day (BID) | ORAL | 0 refills | Status: AC
Start: 2022-07-27 — End: 2022-08-10

## 2022-07-28 LAB — VALPROIC ACID LEVEL: Valproic Acid Lvl: 64 ug/mL (ref 50–100)

## 2022-07-30 ENCOUNTER — Encounter (HOSPITAL_COMMUNITY): Payer: Self-pay | Admitting: Licensed Clinical Social Worker

## 2022-07-30 NOTE — Progress Notes (Signed)
Virtual Visit via Video Note  I connected with Jordan [name] on 07/30/22 at  3:30 PM EDT by a video enabled telemedicine application and verified that I am speaking with the correct person using two identifiers.  Location: Patient: home Provider: home office   I discussed the limitations of evaluation and management by telemedicine and the availability of in person appointments. The patient expressed understanding and agreed to proceed.   I discussed the assessment and treatment plan with the patient. The patient was provided an opportunity to ask questions and all were answered. The patient agreed with the plan and demonstrated an understanding of the instructions.   The patient was advised to call back or seek an in-person evaluation if the symptoms worsen or if the condition fails to improve as anticipated.  I provided 45 minutes of non-face-to-face time during this encounter.   Jordan Melter, LCSW   THERAPIST PROGRESS NOTE  Session Time: 3:30pm-4:15pm  Participation Level: Active  Behavioral Response: Well GroomedAlertIrritable  Type of Therapy: Individual Therapy  Treatment Goals addressed: Reduce frequency, intensity, and duration of depression symptoms so that daily functioning is improved    ProgressTowards Goals: Progressing  Interventions: CBT  Summary: Jordan [name] is a 42 y.o. female who presents with Bipolar I Disorder, depressed, mild.   Suicidal/Homicidal: Nowithout intent/plan  Therapist Response: Jordan [name] engaged well in individual virtual session with clinician. Clinician utilized CBT to process thoughts, feelings, and interactions at home and at work/school. Clinician processed interactions with partner and identified ongoing challenges in communicating. Clinician reflected partner's challenging mental processes and noted that the amount of work Jordan [name] has done for her own mental health far surpasses what her partner has done. Clinician also  reflected Jordan [name]'s willingness to be a part of partner's life, if partner chooses to work on herself. Clinician explored pros and cons of the relationship, as well as pros and cons regarding other big life decisions. Jordan [name] shared that professionally and academically she is excelling, which makes life feel very positive.   Plan: Return again in 1-2 weeks.  Diagnosis: Bipolar 1 disorder, depressed, mild (HCC)  Collaboration of Care: Patient refused AEB none required  Patient/Guardian was advised Release of Information must be obtained prior to any record release in order to collaborate their care with an outside provider. Patient/Guardian was advised if they have not already done so to contact the registration department to sign all necessary forms in order for [place] to release information regarding their care.   Consent: Patient/Guardian gives verbal consent for treatment and assignment of benefits for services provided during this visit. Patient/Guardian expressed understanding and agreed to proceed.   Jordan [name] [place], LCSW 07/30/2022

## 2022-07-31 DIAGNOSIS — M545 Low back pain, unspecified: Secondary | ICD-10-CM | POA: Diagnosis not present

## 2022-08-01 ENCOUNTER — Ambulatory Visit (INDEPENDENT_AMBULATORY_CARE_PROVIDER_SITE_OTHER): Payer: 59

## 2022-08-01 ENCOUNTER — Ambulatory Visit (INDEPENDENT_AMBULATORY_CARE_PROVIDER_SITE_OTHER): Payer: 59 | Admitting: Licensed Clinical Social Worker

## 2022-08-01 VITALS — Ht 69.0 in | Wt 287.0 lb

## 2022-08-01 DIAGNOSIS — Z0000 Encounter for general adult medical examination without abnormal findings: Secondary | ICD-10-CM

## 2022-08-01 DIAGNOSIS — F3131 Bipolar disorder, current episode depressed, mild: Secondary | ICD-10-CM
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
## 2022-08-01 NOTE — Patient Instructions (Signed)
Jordan [name] , Thank you for taking time to come for your Medicare Wellness Visit. I appreciate your ongoing commitment to your health goals. Please review the following plan we discussed and let me know if I can assist you in the future.   These are the goals we discussed:  Goals      Exercise 3x per week (30 min per time)     Walk 2 miles 3 x week     Exercise 5-6x per week (30 min per day and increase as able to 60 minutes per day)     Patient Stated     08/01/2022, eating healthier and losing 50 pounds     Reduce calorie intake to 1800-1900 calories per day     Reduce fat intake to 45-55 grams per day     Reduce saturated fat to <15 grams and trans fat to < 2 grams respecitively        This is a list of the screening recommended for you and due dates:  Health Maintenance  Topic Date Due   COVID-19 Vaccine (4 - 2023-24 season) 10/20/2021   Flu Shot  09/20/2022   Medicare Annual Wellness Visit  08/01/2023   DTaP/Tdap/Td vaccine (2 - Td or Tdap) 10/31/2024   Colon Cancer Screening  07/12/2025   Pap Smear  12/28/2026   Lipid (cholesterol) test  03/15/2027   Hepatitis C Screening  Completed   HIV Screening  Completed   HPV Vaccine  Aged Out    Advanced directives: Advance directive discussed with you today.   Conditions/risks identified: none  Next appointment: Follow up in one year for your annual wellness visit.   Preventive Care 40-64 Years, Female Preventive care refers to lifestyle choices and visits with your health care provider that can promote health and wellness. What does preventive care include? A yearly physical exam. This is also called an annual well check. Dental exams once or twice a year. Routine eye exams. Ask your health care provider how often you should have your eyes checked. Personal lifestyle choices, including: Daily care of your teeth and gums. Regular physical activity. Eating a healthy diet. Avoiding tobacco and drug use. Limiting alcohol  use. Practicing safe sex. Taking low-dose aspirin daily starting at age 51. Taking vitamin and mineral supplements as recommended by your health care provider. What happens during an annual well check? The services and screenings done by your health care provider during your annual well check will depend on your age, overall health, lifestyle risk factors, and family history of disease. Counseling  Your health care provider may ask you questions about your: Alcohol use. Tobacco use. Drug use. Emotional well-being. Home and relationship well-being. Sexual activity. Eating habits. Work and work Astronomer. Method of birth control. Menstrual cycle. Pregnancy history. Screening  You may have the following tests or measurements: Height, weight, and BMI. Blood pressure. Lipid and cholesterol levels. These may be checked every 5 years, or more frequently if you are over 43 years old. Skin check. Lung cancer screening. You may have this screening every year starting at age 3 if you have a 30-pack-year history of smoking and currently smoke or have quit within the past 15 years. Fecal occult blood test (FOBT) of the stool. You may have this test every year starting at age 56. Flexible sigmoidoscopy or colonoscopy. You may have a sigmoidoscopy every 5 years or a colonoscopy every 10 years starting at age 16. Hepatitis C blood test. Hepatitis B blood test. Sexually  transmitted disease (STD) testing. Diabetes screening. This is done by checking your blood sugar (glucose) after you have not eaten for a while (fasting). You may have this done every 1-3 years. Mammogram. This may be done every 1-2 years. Talk to your health care provider about when you should start having regular mammograms. This may depend on whether you have a family history of breast cancer. BRCA-related cancer screening. This may be done if you have a family history of breast, ovarian, tubal, or peritoneal cancers. Pelvic  exam and Pap test. This may be done every 3 years starting at age 21. Starting at age 5, this may be done every 5 years if you have a Pap test in combination with an HPV test. Bone density scan. This is done to screen for osteoporosis. You may have this scan if you are at high risk for osteoporosis. Discuss your test results, treatment options, and if necessary, the need for more tests with your health care provider. Vaccines  Your health care provider may recommend certain vaccines, such as: Influenza vaccine. This is recommended every year. Tetanus, diphtheria, and acellular pertussis (Tdap, Td) vaccine. You may need a Td booster every 10 years. Zoster vaccine. You may need this after age 73. Pneumococcal 13-valent conjugate (PCV13) vaccine. You may need this if you have certain conditions and were not previously vaccinated. Pneumococcal polysaccharide (PPSV23) vaccine. You may need one or two doses if you smoke cigarettes or if you have certain conditions. Talk to your health care provider about which screenings and vaccines you need and how often you need them. This information is not intended to replace advice given to you by your health care provider. Make sure you discuss any questions you have with your health care provider. Document Released: 03/04/2015 Document Revised: 10/26/2015 Document Reviewed: 12/07/2014 Elsevier Interactive Patient Education  2017 [employer].    Fall Prevention in the Home Falls can cause injuries. They can happen to people of all ages. There are many things you can do to make your home safe and to help prevent falls. What can I do on the outside of my home? Regularly fix the edges of walkways and driveways and fix any cracks. Remove anything that might make you trip as you walk through a door, such as a raised step or threshold. Trim any bushes or trees on the path to your home. Use bright outdoor lighting. Clear any walking paths of anything that might  make someone trip, such as rocks or tools. Regularly check to see if handrails are loose or broken. Make sure that both sides of any steps have handrails. Any raised decks and porches should have guardrails on the edges. Have any leaves, snow, or ice cleared regularly. Use sand or salt on walking paths during winter. Clean up any spills in your garage right away. This includes oil or grease spills. What can I do in the bathroom? Use night lights. Install grab bars by the toilet and in the tub and shower. Do not use towel bars as grab bars. Use non-skid mats or decals in the tub or shower. If you need to sit down in the shower, use a plastic, non-slip stool. Keep the floor dry. Clean up any water that spills on the floor as soon as it happens. Remove soap buildup in the tub or shower regularly. Attach bath mats securely with double-sided non-slip rug tape. Do not have throw rugs and other things on the floor that can make you trip. What  can I do in the bedroom? Use night lights. Make sure that you have a light by your bed that is easy to reach. Do not use any sheets or blankets that are too big for your bed. They should not hang down onto the floor. Have a firm chair that has side arms. You can use this for support while you get dressed. Do not have throw rugs and other things on the floor that can make you trip. What can I do in the kitchen? Clean up any spills right away. Avoid walking on wet floors. Keep items that you use a lot in easy-to-reach places. If you need to reach something above you, use a strong step stool that has a grab bar. Keep electrical cords out of the way. Do not use floor polish or wax that makes floors slippery. If you must use wax, use non-skid floor wax. Do not have throw rugs and other things on the floor that can make you trip. What can I do with my stairs? Do not leave any items on the stairs. Make sure that there are handrails on both sides of the stairs  and use them. Fix handrails that are broken or loose. Make sure that handrails are as long as the stairways. Check any carpeting to make sure that it is firmly attached to the stairs. Fix any carpet that is loose or worn. Avoid having throw rugs at the top or bottom of the stairs. If you do have throw rugs, attach them to the floor with carpet tape. Make sure that you have a light switch at the top of the stairs and the bottom of the stairs. If you do not have them, ask someone to add them for you. What else can I do to help prevent falls? Wear shoes that: Do not have high heels. Have rubber bottoms. Are comfortable and fit you well. Are closed at the toe. Do not wear sandals. If you use a stepladder: Make sure that it is fully opened. Do not climb a closed stepladder. Make sure that both sides of the stepladder are locked into place. Ask someone to hold it for you, if possible. Clearly mark and make sure that you can see: Any grab bars or handrails. First and last steps. Where the edge of each step is. Use tools that help you move around (mobility aids) if they are needed. These include: Canes. Walkers. Scooters. Crutches. Turn on the lights when you go into a dark area. Replace any light bulbs as soon as they burn out. Set up your furniture so you have a clear path. Avoid moving your furniture around. If any of your floors are uneven, fix them. If there are any pets around you, be aware of where they are. Review your medicines with your doctor. Some medicines can make you feel dizzy. This can increase your chance of falling. Ask your doctor what other things that you can do to help prevent falls. This information is not intended to replace advice given to you by your health care provider. Make sure you discuss any questions you have with your health care provider. Document Released: 12/02/2008 Document Revised: 07/14/2015 Document Reviewed: 03/12/2014 Elsevier Interactive Patient  Education  2017 [employer].

## 2022-08-02 DIAGNOSIS — M545 Low back pain, unspecified: Secondary | ICD-10-CM | POA: Diagnosis not present

## 2022-08-06 ENCOUNTER — Encounter (HOSPITAL_COMMUNITY): Payer: Self-pay | Admitting: Licensed Clinical Social Worker

## 2022-08-06 NOTE — Progress Notes (Signed)
Virtual Visit via Video Note  I connected with Jordan [name] on 08/06/22 at  4:30 PM EDT by a video enabled telemedicine application and verified that I am speaking with the correct person using two identifiers.  Location: Patient: home Provider: home office   I discussed the limitations of evaluation and management by telemedicine and the availability of in person appointments. The patient expressed understanding and agreed to proceed.   I discussed the assessment and treatment plan with the patient. The patient was provided an opportunity to ask questions and all were answered. The patient agreed with the plan and demonstrated an understanding of the instructions.   The patient was advised to call back or seek an in-person evaluation if the symptoms worsen or if the condition fails to improve as anticipated.  I provided 45 minutes of non-face-to-face time during this encounter.   [name], LCSW   THERAPIST PROGRESS NOTE  Session Time: 4:30pm-5:15pm  Participation Level: Active  Behavioral Response: Well GroomedAlertIrritable  Type of Therapy: Individual Therapy  Treatment Goals addressed: Reduce frequency, intensity, and duration of depression symptoms so that daily functioning is improved    ProgressTowards Goals: Progressing  Interventions: CBT  Summary: Jordan [name] is a 42 y.o. female who presents with Bipolar I depressed, mild.   Suicidal/Homicidal: Nowithout intent/plan  Therapist Response: Akari engaged well in individual virtual session with clinician. Clinician utilized CBT to process thoughts, feelings, and behaviors. Clinician explored relationship with partner and noted that due to Jordan [name]'s busy schedule, she and partner have not been around each other very much. Clinician noted that this has actually helped their relationship. Jordan [name] shared that she feels they moved in together too quickly, so lack of space from each other gave partner no time  to process her insecurities on her own and instead shared them with Jordan [name]. Clinician explored what might be learned from this and noted that space is healthy for relationships. Jordan [name] shared that she is doing great in her schooling and she is excited to do presentations, meet people in the field, and find an apprenticeship.   Plan: Return again in 1-2 weeks.  Diagnosis: Bipolar 1 disorder, depressed, mild (HCC)  Collaboration of Care: Patient refused AEB none required  Patient/Guardian was advised Release of Information must be obtained prior to any record release in order to collaborate their care with an outside provider. Patient/Guardian was advised if they have not already done so to contact the registration department to sign all necessary forms in order for [place] to release information regarding their care.   Consent: Patient/Guardian gives verbal consent for treatment and assignment of benefits for services provided during this visit. Patient/Guardian expressed understanding and agreed to proceed.   Jordan Heck [place], LCSW 08/06/2022

## 2022-08-07 DIAGNOSIS — M545 Low back pain, unspecified: Secondary | ICD-10-CM | POA: Diagnosis not present

## 2022-08-08 ENCOUNTER — Ambulatory Visit (INDEPENDENT_AMBULATORY_CARE_PROVIDER_SITE_OTHER): Payer: 59 | Admitting: Licensed Clinical Social Worker

## 2022-08-08 DIAGNOSIS — F3131 Bipolar disorder, current episode depressed, mild: Secondary | ICD-10-CM

## 2022-08-09 ENCOUNTER — Encounter (HOSPITAL_COMMUNITY): Payer: Self-pay | Admitting: Licensed Clinical Social Worker

## 2022-08-09 DIAGNOSIS — M545 Low back pain, unspecified: Secondary | ICD-10-CM | POA: Diagnosis not present

## 2022-08-09 NOTE — Progress Notes (Signed)
Virtual Visit via Video Note  I connected with DEONE CIOLINO on 08/09/22 at  2:30 PM EDT by a video enabled telemedicine application and verified that I am speaking with the correct person using two identifiers.  Location: Patient: home Provider: home office   I discussed the limitations of evaluation and management by telemedicine and the availability of in person appointments. The patient expressed understanding and agreed to proceed.   I discussed the assessment and treatment plan with the patient. The patient was provided an opportunity to ask questions and all were answered. The patient agreed with the plan and demonstrated an understanding of the instructions.   The patient was advised to call back or seek an in-person evaluation if the symptoms worsen or if the condition fails to improve as anticipated.  I provided 30 minutes of non-face-to-face time during this encounter.   [name], LCSW   THERAPIST PROGRESS NOTE  Session Time: 2:30pm-3:00pm  Participation Level: Active  Behavioral Response: Well GroomedAlertEuthymic  Type of Therapy: Individual Therapy  Treatment Goals addressed: : Reduce frequency, intensity, and duration of depression symptoms so that daily functioning is improved    ProgressTowards Goals: Progressing  Interventions: CBT  Summary: BONNEY MEINS is a 42 y.o. female who presents with Bipolar I disorder, depressed, mild.   Suicidal/Homicidal: Nowithout intent/plan  Therapist Response: Anautica engaged well in virtual session with Facilities manager. Clinician utilized MI OARS to reflect and summarize thoughts and feelings about school, relationship, household, finances, etc. Clinician reflected improvement in mood overall, noting that relationship seems to be more stable. Clinician also noted that Harmoniee has been highly focused on her school, which has been amazing for her mood, self-esteem, and motivation to do better. Clinician explored plans  for her final project and noted that Quamesha is highly focused on being successful and completing the course with high marks.   Plan: Return again in 1 weeks.  Diagnosis: Bipolar 1 disorder, depressed, mild (HCC)  Collaboration of Care: Patient refused AEB none required  Patient/Guardian was advised Release of Information must be obtained prior to any record release in order to collaborate their care with an outside provider. Patient/Guardian was advised if they have not already done so to contact the registration department to sign all necessary forms in order for [place] to release information regarding their care.   Consent: Patient/Guardian gives verbal consent for treatment and assignment of benefits for services provided during this visit. Patient/Guardian expressed understanding and agreed to proceed.   [name] [place], LCSW 08/09/2022

## 2022-08-13 ENCOUNTER — Encounter: Payer: Self-pay | Admitting: Internal Medicine

## 2022-08-14 DIAGNOSIS — M545 Low back pain, unspecified: Secondary | ICD-10-CM | POA: Diagnosis not present

## 2022-08-15 ENCOUNTER — Ambulatory Visit (HOSPITAL_COMMUNITY): Payer: 59 | Admitting: Licensed Clinical Social Worker

## 2022-08-16 DIAGNOSIS — M545 Low back pain, unspecified: Secondary | ICD-10-CM | POA: Diagnosis not present

## 2022-08-21 DIAGNOSIS — M545 Low back pain, unspecified: Secondary | ICD-10-CM | POA: Diagnosis not present

## 2022-08-22 ENCOUNTER — Ambulatory Visit (HOSPITAL_COMMUNITY): Payer: 59 | Admitting: Licensed Clinical Social Worker

## 2022-08-23 DIAGNOSIS — M545 Low back pain, unspecified: Secondary | ICD-10-CM | POA: Diagnosis not present

## 2022-08-27 DIAGNOSIS — M545 Low back pain, unspecified: Secondary | ICD-10-CM | POA: Diagnosis not present

## 2022-08-30 ENCOUNTER — Other Ambulatory Visit: Payer: Self-pay | Admitting: Internal Medicine

## 2022-08-30 DIAGNOSIS — N644 Mastodynia: Secondary | ICD-10-CM

## 2022-08-30 NOTE — Progress Notes (Signed)
Placed referral to plastic surgery for evaluation of breast reduction.

## 2022-08-31 ENCOUNTER — Telehealth (HOSPITAL_BASED_OUTPATIENT_CLINIC_OR_DEPARTMENT_OTHER): Payer: 59 | Admitting: Psychiatry

## 2022-08-31 ENCOUNTER — Encounter (HOSPITAL_COMMUNITY): Payer: Self-pay | Admitting: Psychiatry

## 2022-08-31 VITALS — Wt 287.0 lb

## 2022-08-31 DIAGNOSIS — F4310 Post-traumatic stress disorder, unspecified: Secondary | ICD-10-CM

## 2022-08-31 DIAGNOSIS — F419 Anxiety disorder, unspecified: Secondary | ICD-10-CM | POA: Diagnosis not present

## 2022-08-31 DIAGNOSIS — F3132 Bipolar disorder, current episode depressed, moderate: Secondary | ICD-10-CM

## 2022-08-31 MED ORDER — HYDROXYZINE HCL 10 MG PO TABS
20.0000 mg | ORAL_TABLET | Freq: Every day | ORAL | 2 refills | Status: DC | PRN
Start: 2022-08-31 — End: 2023-03-01

## 2022-08-31 MED ORDER — DIVALPROEX SODIUM ER 500 MG PO TB24
1000.0000 mg | ORAL_TABLET | Freq: Every day | ORAL | 2 refills | Status: DC
Start: 2022-08-31 — End: 2023-02-07
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
## 2022-09-04 DIAGNOSIS — M545 Low back pain, unspecified: Secondary | ICD-10-CM | POA: Diagnosis not present

## 2022-09-05 ENCOUNTER — Encounter (HOSPITAL_COMMUNITY): Payer: Self-pay | Admitting: Licensed Clinical Social Worker

## 2022-09-05 ENCOUNTER — Ambulatory Visit (INDEPENDENT_AMBULATORY_CARE_PROVIDER_SITE_OTHER): Payer: 59 | Admitting: Licensed Clinical Social Worker

## 2022-09-05 DIAGNOSIS — F3131 Bipolar disorder, current episode depressed, mild: Secondary | ICD-10-CM

## 2022-09-05 NOTE — Progress Notes (Signed)
Virtual Visit via Video Note  I connected with Jordan [name] on 09/05/22 at  8:00 AM EDT by a video enabled telemedicine application and verified that I am speaking with the correct person using two identifiers.  Location: Patient: home Provider: home office   I discussed the limitations of evaluation and management by telemedicine and the availability of in person appointments. The patient expressed understanding and agreed to proceed.   I discussed the assessment and treatment plan with the patient. The patient was provided an opportunity to ask questions and all were answered. The patient agreed with the plan and demonstrated an understanding of the instructions.   The patient was advised to call back or seek an in-person evaluation if the symptoms worsen or if the condition fails to improve as anticipated.  I provided 45 minutes of non-face-to-face time during this encounter.   [name], LCSW   THERAPIST PROGRESS NOTE  Session Time: 8:00am-8:45am  Participation Level: Active  Behavioral Response: Well GroomedAlertEuthymic  Type of Therapy: Individual Therapy  Treatment Goals addressed: Reduce frequency, intensity, and duration of depression symptoms so that daily functioning is improved      ProgressTowards Goals: Progressing  Interventions: CBT  Summary: Jordan [name] is a 42 y.o. female who presents with Bipolar I, depressed, mild.   Suicidal/Homicidal: Nowithout intent/plan  Therapist Response: Jordan [name] engaged well in individual virtual session with clinician. Clinician utilized CBT to process updates in mood, interactions, and progress toward goals. 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 Clinician explored updates with family and coping with partner's insecurities and jealousy. Clinician explored coping skills and communication between Jordan [name] and partner. Clinician also validated Jordan [name]'s ongoing motivation to focus on her own path, goals, and successes. Jordan [name] shared concerns about niece, who is having some financial struggles. Clinician reflected the concern and frustration that niece is making bad choices with alcohol and partners. Clinician identified that genetics will often outweigh environment and even though she raised niece to make better decisions, she is still genetically linked to 2 addicts. Clinician also noted that everyone has to learn in their own time and with their own experiences.   Plan: Return again in 2 weeks.  Diagnosis: Bipolar 1 disorder, depressed, mild (HCC)  Collaboration of Care: Medication Management AEB reviewed note from Dr. Lolly Mustache last week.   Patient/Guardian was advised Release of Information must be obtained prior to any record release in order to collaborate their care with an outside provider. Patient/Guardian was advised if they have not already done so to contact the registration department to sign all necessary forms in order for [place] to release information regarding their care.   Consent: Patient/Guardian gives verbal consent for treatment and assignment of benefits for services provided during this visit. Patient/Guardian expressed understanding and agreed to proceed.   [name] [place], LCSW 09/05/2022

## 2022-09-06 DIAGNOSIS — M545 Low back pain, unspecified: Secondary | ICD-10-CM | POA: Diagnosis not present

## 2022-09-12 DIAGNOSIS — M545 Low back pain, unspecified: Secondary | ICD-10-CM | POA: Diagnosis not present

## 2022-09-13 DIAGNOSIS — M545 Low back pain, unspecified: Secondary | ICD-10-CM | POA: Diagnosis not present

## 2022-09-17 ENCOUNTER — Encounter: Payer: Self-pay | Admitting: Internal Medicine

## 2022-09-19 ENCOUNTER — Encounter (INDEPENDENT_AMBULATORY_CARE_PROVIDER_SITE_OTHER): Payer: 59 | Admitting: Family Medicine

## 2022-09-19 ENCOUNTER — Ambulatory Visit (INDEPENDENT_AMBULATORY_CARE_PROVIDER_SITE_OTHER): Payer: 59 | Admitting: Licensed Clinical Social Worker

## 2022-09-19 DIAGNOSIS — F3131 Bipolar disorder, current episode depressed, mild: Secondary | ICD-10-CM

## 2022-09-20 ENCOUNTER — Encounter (INDEPENDENT_AMBULATORY_CARE_PROVIDER_SITE_OTHER): Payer: 59 | Admitting: Family Medicine

## 2022-09-20 ENCOUNTER — Encounter (HOSPITAL_COMMUNITY): Payer: Self-pay | Admitting: Licensed Clinical Social Worker

## 2022-09-20 NOTE — Progress Notes (Signed)
Virtual Visit via Video Note  I connected with Jordan [name] on 09/20/22 at  8:00 AM EDT by a video enabled telemedicine application and verified that I am speaking with the correct person using two identifiers.  Location: Patient: home Provider: home office   I discussed the limitations of evaluation and management by telemedicine and the availability of in person appointments. The patient expressed understanding and agreed to proceed.   I discussed the assessment and treatment plan with the patient. The patient was provided an opportunity to ask questions and all were answered. The patient agreed with the plan and demonstrated an understanding of the instructions.   The patient was advised to call back or seek an in-person evaluation if the symptoms worsen or if the condition fails to improve as anticipated.  I provided 45 minutes of non-face-to-face time during this encounter.   Jordan Melter, LCSW   THERAPIST PROGRESS NOTE  Session Time: 8:00am-8:45am  Participation Level: Active  Behavioral Response: Well GroomedLethargicDepressed  Type of Therapy: Individual Therapy  Treatment Goals addressed: Reduce frequency, intensity, and duration of depression symptoms so that daily functioning is improved    ProgressTowards Goals: Progressing  Interventions: CBT  Summary: Jordan [name] is a 42 y.o. female who presents with Bipolar I disorder, depressed, moderate.   Suicidal/Homicidal: Nowithout intent/plan  Therapist Response: Jordan [name] engaged well in individual virtual session with clinician. Clinician utilized CBT to process thoughts, feelings, and behaviors. Clinician explored updates in life, and processed recent break up with partner. Clinician reflected sadness and anger about partner's inability to be supportive, trusting, and consistent. Clinician processed the changes Jordan [name] has made for herself, in going to school, starting a new apprenticeship, and gains in her  health/wellness. Clinician provided time and space for Jordan [name] to share her experiences with partner and how everything happened. Clinician discussed next steps and plans for the future. Clinician reflected the importance of rest and explored options with meds.  Jordan [name] shared she has not been able to sleep for 5 days since this emotional upheaval has been happening. Clinician reviewed coping skills and sleep hygiene. Clinician also encouraged Jordan [name] to reach out to Jordan [name] if she needs additional sleep meds.   Plan: Return again in 1 weeks.  Diagnosis: Bipolar 1 disorder, depressed, mild (HCC)  Collaboration of Care: Medication Management AEB notified Jordan [name] that Jordan [name] has not been sleeping for several days. Encouraged Jordan [name] to reach out to Jordan [name] if sleep does not return soon.   Patient/Guardian was advised Release of Information must be obtained prior to any record release in order to collaborate their care with an outside provider. Patient/Guardian was advised if they have not already done so to contact the registration department to sign all necessary forms in order for [place] to release information regarding their care.   Consent: Patient/Guardian gives verbal consent for treatment and assignment of benefits for services provided during this visit. Patient/Guardian expressed understanding and agreed to proceed.   Jordan [name] [place], LCSW 09/20/2022

## 2022-09-25 ENCOUNTER — Ambulatory Visit (INDEPENDENT_AMBULATORY_CARE_PROVIDER_SITE_OTHER): Payer: 59 | Admitting: Licensed Clinical Social Worker

## 2022-09-25 ENCOUNTER — Encounter (HOSPITAL_COMMUNITY): Payer: Self-pay | Admitting: Licensed Clinical Social Worker

## 2022-09-25 DIAGNOSIS — F3131 Bipolar disorder, current episode depressed, mild: Secondary | ICD-10-CM

## 2022-09-25 NOTE — Progress Notes (Signed)
   THERAPIST PROGRESS NOTE  Session Time: 10:00am-10:50am  Participation Level: Active  Behavioral Response: Well GroomedAlertDepressed and Irritable  Type of Therapy: Individual Therapy  Treatment Goals addressed: Reduce frequency, intensity, and duration of depression symptoms so that daily functioning is improved    ProgressTowards Goals: Progressing  Interventions: CBT  Summary: Jordan [name] is a 42 y.o. female who presents with Bipolar I depressed.   Suicidal/Homicidal: Nowithout intent/plan  Therapist Response: Cathaleen engaged well in individual session with clinician. Clinician utilized CBT to process thoughts, feelings, and interactions. [name] shared updates about breakup and noted that her ex is still living in her home and trying to be nice and apologetic. Clinician explored [name]'s thoughts about this and noted that [name] is still focused on her career, school, and goals toward home ownership. 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 Clinician reflected the excitement [name] feels about her future and her preparation for living on her own again. Clinician identified disappointment that this relationship did not work out and noted that she will continue to support the kid, as "it is not her fault that the mama is a mess".   Plan: Return again in 1 weeks.  Diagnosis: Bipolar 1 disorder, depressed, mild (HCC)  Collaboration of Care: Patient refused AEB none required  Patient/Guardian was advised Release of Information must be obtained prior to any record release in order to collaborate their care with an outside provider. Patient/Guardian was advised if they have not already done so to contact the registration department to sign all necessary forms in order for [place] to release information regarding their care.   Consent:  Patient/Guardian gives verbal consent for treatment and assignment of benefits for services provided during this visit. Patient/Guardian expressed understanding and agreed to proceed.   [name] [place], LCSW 09/25/2022

## 2022-09-26 DIAGNOSIS — M545 Low back pain, unspecified: Secondary | ICD-10-CM | POA: Diagnosis not present

## 2022-10-04 ENCOUNTER — Encounter (HOSPITAL_COMMUNITY): Payer: Self-pay | Admitting: Licensed Clinical Social Worker

## 2022-10-04 ENCOUNTER — Ambulatory Visit (INDEPENDENT_AMBULATORY_CARE_PROVIDER_SITE_OTHER): Payer: 59 | Admitting: Licensed Clinical Social Worker

## 2022-10-04 DIAGNOSIS — F3131 Bipolar disorder, current episode depressed, mild: Secondary | ICD-10-CM | POA: Diagnosis not present

## 2022-10-04 DIAGNOSIS — M545 Low back pain, unspecified: Secondary | ICD-10-CM | POA: Diagnosis not present

## 2022-10-04 NOTE — Progress Notes (Signed)
Virtual Visit via Video Note  I connected with Jordan [name] on 10/04/22 at  9:00 AM EDT by a video enabled telemedicine application and verified that I am speaking with the correct person using two identifiers.  Location: Patient: home Provider: home office   I discussed the limitations of evaluation and management by telemedicine and the availability of in person appointments. The patient expressed understanding and agreed to proceed.   I discussed the assessment and treatment plan with the patient. The patient was provided an opportunity to ask questions and all were answered. The patient agreed with the plan and demonstrated an understanding of the instructions.   The patient was advised to call back or seek an in-person evaluation if the symptoms worsen or if the condition fails to improve as anticipated.  I provided 45 minutes of non-face-to-face time during this encounter.   Jordan Melter, LCSW   THERAPIST PROGRESS NOTE  Session Time: 9:00am-9:45am  Participation Level: Active  Behavioral Response: Well GroomedAlertEuthymic  Type of Therapy: Individual Therapy  Treatment Goals addressed: Reduce frequency, intensity, and duration of depression symptoms so that daily functioning is improved    ProgressTowards Goals: Progressing  Interventions: CBT  Summary: Jordan [name] is a 42 y.o. female who presents with Bipolar I, depressed, mild.   Suicidal/Homicidal: Nowithout intent/plan  Therapist Response: Jordan [name] engaged well in individual virtual session with clinician. Clinician utilized CBT to process thoughts, feelings, and interactions. Jordan [name] shared that on her last visit to her mentor with Jordan [name], Jordan continues to make progress toward her goals of completing the program, being accepted to the apprenticeship program, and being approved for the Jordan [name]'s home ownership program. Jordan [name] shared that her plan will be to have her own  home in 3 years. Clinician discussed the feelings of pride and excitement about meeting her goals and continues to work toward the next steps. Clinician discussed hx of relationships with her parents, sister, partners, and friends. Jordan [name] identified her pattern of becoming the caregiver in her relationships, attracting women who need or want to be taken care of, and any red flags that have been present. Jordan [name] shared that it surprised her how different her experience has been and the people who raised her were able to teach and share new experiences with her, and Jordan does the same with her partners. Clinician provided time and space for Jordan [name] to tell stories about her step-father, who died in the past 2-3 years. Jordan became tearful, but was able to talk through the tears and communicate about their relationship and their love.   Plan: Return again in 1 weeks.  Diagnosis: Bipolar 1 disorder, depressed, mild (HCC)  Collaboration of Care: Patient refused AEB none required  Patient/Guardian was advised Release of Information must be obtained prior to any record release in order to collaborate their care with an outside provider. Patient/Guardian was advised if they have not already done so to contact the registration department to sign all necessary forms in order for [place] to release information regarding their care.   Consent: Patient/Guardian gives verbal consent for treatment and assignment of benefits for services provided during this visit. Patient/Guardian expressed understanding and agreed to proceed.   Jordan [name] [place], LCSW 10/04/2022

## 2022-10-05 DIAGNOSIS — M545 Low back pain, unspecified: Secondary | ICD-10-CM | POA: Diagnosis not present

## 2022-10-09 ENCOUNTER — Ambulatory Visit (INDEPENDENT_AMBULATORY_CARE_PROVIDER_SITE_OTHER): Payer: 59 | Admitting: Licensed Clinical Social Worker

## 2022-10-09 DIAGNOSIS — F3131 Bipolar disorder, current episode depressed, mild: Secondary | ICD-10-CM | POA: Diagnosis not present

## 2022-10-10 NOTE — Progress Notes (Unsigned)
   THERAPIST PROGRESS NOTE  Session Time: 2:30pm-3:20pm  Participation Level: Active  Behavioral Response: Well GroomedAlertAnxious, Depressed, and Irritable  Type of Therapy: Individual Therapy  Treatment Goals addressed: Reduce frequency, intensity, and duration of depression symptoms so that daily functioning is improved    ProgressTowards Goals: {Progress Towards Goals:21014066}  Interventions: {CHL AMB BH Type of Intervention:21022753}  Summary: Jordan [name] is a 42 y.o. female who presents with ***.   Suicidal/Homicidal: {BHH YES OR NO:22294}{yes/no/with/without intent/plan:22693}  Therapist Response: ***  Plan: Return again in *** weeks.  Diagnosis: No diagnosis found.  Collaboration of Care: {BH OP Collaboration of Care:21014065}  Patient/Guardian was advised Release of Information must be obtained prior to any record release in order to collaborate their care with an outside provider. Patient/Guardian was advised if they have not already done so to contact the registration department to sign all necessary forms in order for [place] to release information regarding their care.   Consent: Patient/Guardian gives verbal consent for treatment and assignment of benefits for services provided during this visit. Patient/Guardian expressed understanding and agreed to proceed.   [name] [place], LCSW 10/10/2022

## 2022-10-11 ENCOUNTER — Encounter (HOSPITAL_COMMUNITY): Payer: Self-pay | Admitting: Licensed Clinical Social Worker

## 2022-10-11 ENCOUNTER — Ambulatory Visit (INDEPENDENT_AMBULATORY_CARE_PROVIDER_SITE_OTHER): Payer: 59 | Admitting: Plastic Surgery

## 2022-10-11 ENCOUNTER — Encounter: Payer: Self-pay | Admitting: Plastic Surgery

## 2022-10-11 VITALS — BP 108/75 | HR 67 | Ht 69.0 in | Wt 290.8 lb

## 2022-10-11 DIAGNOSIS — N62 Hypertrophy of breast: Secondary | ICD-10-CM

## 2022-10-11 DIAGNOSIS — N6459 Other signs and symptoms in breast: Secondary | ICD-10-CM | POA: Diagnosis not present

## 2022-10-11 DIAGNOSIS — Z6841 Body Mass Index (BMI) 40.0 and over, adult: Secondary | ICD-10-CM

## 2022-10-11 DIAGNOSIS — M546 Pain in thoracic spine: Secondary | ICD-10-CM | POA: Diagnosis not present

## 2022-10-11 DIAGNOSIS — M542 Cervicalgia: Secondary | ICD-10-CM

## 2022-10-11 DIAGNOSIS — N6489 Other specified disorders of breast: Secondary | ICD-10-CM

## 2022-10-11 NOTE — Progress Notes (Signed)
Referring Provider Jordan Poll, MD [address] [place],  [place] [address]   CC:  Chief Complaint  Patient presents with   Consult           Jordan [name] is an 42 y.o. female.  HPI: Jordan [name] is a 42 year old who presents with complaints of upper back and neck pain which she attributes to the large size of her breast.  Patient states that she has had problems with the large size of her breast since she developed them in puberty.  She states that this is gotten worse as she has gotten older.  She has seen physical therapy who has done dry needling and other adjunctive therapies to treat her neck and back pain.  She has been treated with NSAIDs by her primary care physician also to try to alleviate her upper back and neck pain especially since she turned 29 she also noticed a significant asymmetry in her breast.  Of note patient is currently undergoing evaluation for a palpable mass in her right breast.  The mass is felt to be a fibroadenoma however she has a follow-up ultrasound scheduled for February 2025 to confirm the mass has not changed in shape or size or consistency.  The patient otherwise denies any significant medical problems or issues with her breasts.  She would like to have a bilateral breast reduction.  No Known Allergies  Outpatient Encounter Medications as of 10/11/2022  Medication Sig   diclofenac Sodium (VOLTAREN) 1 % GEL APPLY 4 G TOPICALLY 4 TIMES DAILY   divalproex (DEPAKOTE ER) 500 MG 24 hr tablet Take 2 tablets (1,000 mg total) by mouth daily.   fluticasone (FLONASE) 50 MCG/ACT nasal spray Place 1 spray into both nostrils daily.   hydrOXYzine (ATARAX) 10 MG tablet Take 2-3 tablets (20-30 mg total) by mouth daily as needed for anxiety.   methocarbamol (ROBAXIN) 500 MG tablet Take 500 mg by mouth every 6 (six) hours as needed.   Multiple Vitamins-Minerals (ADULT ONE DAILY GUMMIES PO) Take 2 tablets by mouth daily.   pregabalin (LYRICA) 150 MG capsule Take 1  capsule (150 mg total) by mouth 2 (two) times daily.   No facility-administered encounter medications on file as of 10/11/2022.     Past Medical History:  Diagnosis Date   Alcohol abuse    Allergic rhinitis    pt takes flonase for episodes, no episodes in 2012   Anemia    Anxiety    Basal cell carcinoma    recurrent in left maxillary, has had 5 surguries, last one 2003   Basal cell nevus syndrome    dx age 65   Bipolar 1 disorder (HCC)    BPPV (benign paroxysmal positional vertigo) 03/01/2016   Candidiasis, vagina    recurrent, pt has made lifestyle modifications and none recently (as of 8/12)   Cervicitis 03/19/2019   Cyst of nasal sinus    Depression    B-Polar   Fibroma    left ovarian   Fibromyalgia    Gardnerella infection    + in 08/08   GERD (gastroesophageal reflux disease)    occ   Hematoma, subungual, finger, right, initial encounter 01/23/2021   Herniated disc    Hyperlipidemia    Joint pain    Lactose intolerance    Obesity    Ovarian cyst 08/23/2020   OVARIAN CYSTECTOMY, HX OF 03/01/2006   ovarian fibroma-left 1996, L ovary and fallopian tube removed     Paronychia of third  finger of left hand 07/28/2010   PLANTAR FASCIITIS, BILATERAL 05/24/2009   Pre-diabetes    PTSD (post-traumatic stress disorder)    [place]    hx of herpetic requiring I+D,( was bitten by autistic child that cares fr    Past Surgical History:  Procedure Laterality Date   CHOLECYSTECTOMY  1999   colonscopy  07/2020   f/u in 5 yrs   HYSTEROSCOPY N/A 10/04/2020   Procedure: DIAGNOSTIC HYSTEROSCOPY;  Surgeon: Warden Fillers, MD;  Location: [facility];  Service: Gynecology;  Laterality: N/A;   LEFT OOPHORECTOMY     age 69   LIPOMA EXCISION Left 03/02/2021   Procedure: ZOXWRU EXCISION LIPOMA;  Surgeon: Gaynelle Adu, MD;  Location[facility];  Service: General;  Laterality: Left;   LUMBAR LAMINECTOMY/DECOMPRESSION MICRODISCECTOMY N/A 08/14/2012    Procedure: LUMBAR LAMINECTOMY/DECOMPRESSION MICRODISCECTOMY Lumbar 5 -sacrum 1 decompression;  Surgeon: Emilee Hero, MD;  Location: [facility] OR;  Service: Orthopedics;  Laterality: N/A;  Lumbar 5 -sacrum 1 decompression   MANDIBLE RECONSTRUCTION  1992   upper anfd lower jaw cyst   NASAL SINUS SURGERY     X 2 at age 76 & 51    Family History  Problem Relation Age of Onset   Bipolar disorder Mother    Alcohol abuse Mother    Drug abuse Mother    Other Mother        DDD   Hypertension Mother    Hyperlipidemia Mother    Depression Mother    Anxiety disorder Mother    Alcoholism Mother    Colon polyps Mother    Bipolar disorder Father    Other Father        basal cell nevus syndrome   Depression Father    Drug abuse Sister    Colon polyps Sister    Drug abuse Brother    Other Brother        basal cell syndrome   Breast cancer Maternal Aunt    Breast cancer Maternal Aunt    Diabetes Maternal Grandmother    Heart attack Maternal Grandmother    Cancer Maternal Grandmother        stomach   Stomach cancer Other        uncle   Cancer Other        unknown grandfather   Diabetes Other        grandmother, aunt and 2 uncles   Colon cancer Other        cousin at age 58    Social History   Social History Narrative   Current Social History 09/15/20      Patient lives alone in a townhome which is 2 stories. There are 4 steps up to the entrance the patient uses. There is a railing.      Patient's method of transportation is personal car.      The highest level of education was some college.      The patient currently disabled but working part time       Identified important Relationships are "2 nieces and my best friend"       Pets : 1 dog, yorkie named Photographer / Fun: "Umm I don't know I am trying to find some things"      Current Stressors: "finances and trying to keep this weight off"       Religious / Personal Beliefs: "Christianity"  Review of Systems General: Denies fevers, chills, weight loss CV: Denies chest pain, shortness of breath, palpitations Breast: Patient has very large and asymmetric breasts which she attributes to her upper back and neck pain.  Physical Exam    10/11/2022   10:57 AM 08/31/2022   10:32 AM 08/01/2022    8:14 AM  Vitals with BMI  Height 5\' 9"   5\' 9"   Weight 290 lbs 13 oz  287 lbs  BMI 42.92  42.36  Systolic 108  --  Diastolic 75  --  Pulse 67  --     Information is confidential and restricted. Go to Review Flowsheets to unlock data.    General:  No acute distress,  Alert and oriented, Non-Toxic, Normal speech and affect Breast: Patient has very large breasts with the right side at least a cup size larger than the left side.  The area of concern is easily palpable on the right breast.  There are no dominant masses in the left breast.  Both nipples are normal in appearance.  She has grade 3 ptosis bilaterally her sternal notch to nipple distance is 37 on the right and 34 on the left her fold to nipple distance on the right is 18 cm and 17 cm on the left Mammogram: February 2024 BI-RADS 3 probably benign mass in the right breast at the 3 o'clock position.  Recommendation is for mammogram and ultrasound follow-up in February 2025 Assessment/Plan Symptomatic macromastia and breast asymmetry: Patient would be a good candidate for bilateral breast reduction.  I believe I can remove 1000 g on the right and 800 g on the left.  The patient does have a BMI greater than 40 and I have asked her to work on weight loss between now and the time that we plan for surgery.  She is quite tall which contributes to her elevated BMI.  I have also told her that we will not do her surgery until after her mammogram and ultrasound are completed in February next year and are felt to be stable and consistent with benign disease.  We did discuss breast reductions at length.  I showed her the location of the incisions and  discussed the unpredictable nature of scarring.  We discussed the risks of bleeding, infection, and seroma formation.  She understands I will use drains postoperatively to decrease the risk of seroma formation.  We discussed the risk of nipple loss due to nipple ischemia.  In her case she understands that she may still have some asymmetry postoperatively however I will try to minimize this is much as possible.  We discussed the postoperative limitations including no heavy lifting greater than 20 pounds, no vigorous activity, no submerging the incisions in water for 6 weeks.  She understands she will also need to wear a supportive garment for 6 weeks after surgery.  She may return to light activities as tolerated.  All questions were answered to her satisfaction.  Photographs were obtained today with her consent.  Will schedule her for a bilateral breast reduction to be done after February 2025 at her request.  I have asked her to return to see me as soon as the ultrasound and mammogram are done next year.  Santiago Glad 10/11/2022, 1:21 PM

## 2022-10-16 ENCOUNTER — Ambulatory Visit (INDEPENDENT_AMBULATORY_CARE_PROVIDER_SITE_OTHER): Payer: 59 | Admitting: Licensed Clinical Social Worker

## 2022-10-16 DIAGNOSIS — F3131 Bipolar disorder, current episode depressed, mild: Secondary | ICD-10-CM

## 2022-10-17 DIAGNOSIS — M545 Low back pain, unspecified: Secondary | ICD-10-CM | POA: Diagnosis not present

## 2022-10-18 ENCOUNTER — Telehealth: Payer: Self-pay | Admitting: Plastic Surgery

## 2022-10-18 ENCOUNTER — Other Ambulatory Visit (HOSPITAL_COMMUNITY): Payer: Self-pay | Admitting: Psychiatry

## 2022-10-18 DIAGNOSIS — F4310 Post-traumatic stress disorder, unspecified: Secondary | ICD-10-CM

## 2022-10-18 DIAGNOSIS — F419 Anxiety disorder, unspecified: Secondary | ICD-10-CM

## 2022-10-18 DIAGNOSIS — F3132 Bipolar disorder, current episode depressed, moderate: Secondary | ICD-10-CM

## 2022-10-18 NOTE — Telephone Encounter (Signed)
Preparing to book sx - saw SOAP notes: Per dr taylors notes - do not schedule surgeries until mammo in February 2025

## 2022-10-19 DIAGNOSIS — M545 Low back pain, unspecified: Secondary | ICD-10-CM | POA: Diagnosis not present

## 2022-10-22 DIAGNOSIS — M545 Low back pain, unspecified: Secondary | ICD-10-CM | POA: Diagnosis not present

## 2022-10-23 ENCOUNTER — Encounter (HOSPITAL_COMMUNITY): Payer: Self-pay | Admitting: Licensed Clinical Social Worker

## 2022-10-23 ENCOUNTER — Ambulatory Visit (HOSPITAL_COMMUNITY): Payer: 59 | Admitting: Licensed Clinical Social Worker

## 2022-10-23 NOTE — Progress Notes (Signed)
   THERAPIST PROGRESS NOTE  Session Time: 11:00am-11:50am  Participation Level: Active  Behavioral Response: Well GroomedAlertEuthymic  Type of Therapy: Individual Therapy  Treatment Goals addressed: Reduce frequency, intensity, and duration of depression symptoms so that daily functioning is improved    ProgressTowards Goals: Progressing  Interventions: CBT  Summary: Jordan [name] is a 42 y.o. female who presents with Bipolar I, depressed, mild.   Suicidal/Homicidal: Nowithout intent/plan  Therapist Response: Cashae engaged well in individual session with clinician. Clinician utilized CBT to process thoughts, feelings, and behaviors. Clinician processed updates in her household and concerns about nieces and sister. Clinician discussed locus of control and what [name] is able to actually control in their situations. Clinician processed [name]'s concern about niece, who seems to be following in [name]'s footsteps, making bad choices, drinking alcohol, and getting involved in unhelpful relationships. Clinician provided time and space for [name] to share her concerns and to explore what can be said or done. [name] realized that she is unable to make actual change and decided to work on letting go, while still being present and available for consultation.   Plan: Return again in 1-2 weeks.  Diagnosis: Bipolar 1 disorder, depressed, mild (HCC)  Collaboration of Care: Patient refused AEB none required  Patient/Guardian was advised Release of Information must be obtained prior to any record release in order to collaborate their care with an outside provider. Patient/Guardian was advised if they have not already done so to contact the registration department to sign all necessary forms in order for [place] to release information regarding their care.   Consent: Patient/Guardian gives verbal consent for treatment and assignment of benefits for services provided during this visit. Patient/Guardian  expressed understanding and agreed to proceed.   [name] [place], LCSW 10/23/2022

## 2022-10-25 DIAGNOSIS — M545 Low back pain, unspecified: Secondary | ICD-10-CM | POA: Diagnosis not present

## 2022-10-30 DIAGNOSIS — M545 Low back pain, unspecified: Secondary | ICD-10-CM | POA: Diagnosis not present

## 2022-10-31 ENCOUNTER — Ambulatory Visit (INDEPENDENT_AMBULATORY_CARE_PROVIDER_SITE_OTHER): Payer: 59 | Admitting: Licensed Clinical Social Worker

## 2022-10-31 ENCOUNTER — Encounter (HOSPITAL_COMMUNITY): Payer: Self-pay | Admitting: Licensed Clinical Social Worker

## 2022-10-31 DIAGNOSIS — F3131 Bipolar disorder, current episode depressed, mild: Secondary | ICD-10-CM

## 2022-10-31 NOTE — Progress Notes (Signed)
Virtual Visit via Video Note  I connected with Jordan [name] on 10/31/22 at  9:00 AM EDT by a video enabled telemedicine application and verified that I am speaking with the correct person using two identifiers.  Location: Patient: home Provider: home office   I discussed the limitations of evaluation and management by telemedicine and the availability of in person appointments. The patient expressed understanding and agreed to proceed.   I discussed the assessment and treatment plan with the patient. The patient was provided an opportunity to ask questions and all were answered. The patient agreed with the plan and demonstrated an understanding of the instructions.   The patient was advised to call back or seek an in-person evaluation if the symptoms worsen or if the condition fails to improve as anticipated.  I provided 50 minutes of non-face-to-face time during this encounter.   [name], LCSW   THERAPIST PROGRESS NOTE  Session Time: 9:00am-9:50am  Participation Level: Active  Behavioral Response: CasualLethargicIrritable  Type of Therapy: Individual Therapy  Treatment Goals addressed: Reduce frequency, intensity, and duration of depression symptoms so that daily functioning is improved   ProgressTowards Goals: Progressing  Interventions: CBT  Summary: Jordan [name] is a 42 y.o. female who presents with Bipolar I, depressed.   Suicidal/Homicidal: Nowithout intent/plan  Therapist Response: Jordan [name] engaged well in individual virtual session with clinician. Clinician utilized CBT to process thoughts, feelings, and behaviors. Clinician explored mood and noted ongoing irritability and frustration with relationship. Clinician processed the impact of relationship stress,as well as concerns about mother, and grief over loss of her dog. Clinician explored the intrusive thoughts that have been coming through and questioned why Jordan [name] felt these things are coming up now.  Jordan [name] shared she is feeling overwhelmed by both exciting things and scary things coming in the future. Clinician provided support and reflected the value of sharing these thoughts and working through them, rather than just making herself busy to drown them out. Clinician reflected past urges to be personally damaging by fighting or drinking alcohol. Jordan [name] shared she has been drinking a little more than usual, which worries her. Clinician identified that Jordan [name] can only control herself and reminding herself of this and truly believing this can release the responsibility she feels over her mother, her adult nieces, and her partner.  Plan: Return again in 1 weeks.  Diagnosis: Bipolar 1 disorder, depressed, mild (HCC)  Collaboration of Care: Medication Management AEB provided update to Dr. Lolly Mustache and recommended scheduling an appt in October.  Patient/Guardian was advised Release of Information must be obtained prior to any record release in order to collaborate their care with an outside provider. Patient/Guardian was advised if they have not already done so to contact the registration department to sign all necessary forms in order for [place] to release information regarding their care.   Consent: Patient/Guardian gives verbal consent for treatment and assignment of benefits for services provided during this visit. Patient/Guardian expressed understanding and agreed to proceed.   [name] [place], LCSW 10/31/2022

## 2022-11-01 DIAGNOSIS — M545 Low back pain, unspecified: Secondary | ICD-10-CM | POA: Diagnosis not present

## 2022-11-06 ENCOUNTER — Ambulatory Visit (HOSPITAL_COMMUNITY): Payer: 59 | Admitting: Licensed Clinical Social Worker

## 2022-11-08 ENCOUNTER — Telehealth: Payer: Self-pay

## 2022-11-13 DIAGNOSIS — M545 Low back pain, unspecified: Secondary | ICD-10-CM | POA: Diagnosis not present

## 2022-11-15 DIAGNOSIS — M545 Low back pain, unspecified: Secondary | ICD-10-CM | POA: Diagnosis not present

## 2022-11-20 DIAGNOSIS — M545 Low back pain, unspecified: Secondary | ICD-10-CM | POA: Diagnosis not present

## 2022-11-29 DIAGNOSIS — M545 Low back pain, unspecified: Secondary | ICD-10-CM | POA: Diagnosis not present

## 2022-12-06 ENCOUNTER — Ambulatory Visit (HOSPITAL_COMMUNITY): Payer: 59 | Admitting: Licensed Clinical Social Worker

## 2022-12-11 DIAGNOSIS — M545 Low back pain, unspecified: Secondary | ICD-10-CM | POA: Diagnosis not present

## 2022-12-16 ENCOUNTER — Ambulatory Visit (HOSPITAL_COMMUNITY): Payer: 59

## 2022-12-16 ENCOUNTER — Ambulatory Visit (HOSPITAL_COMMUNITY)
Admission: EM | Admit: 2022-12-16 | Discharge: 2022-12-16 | Disposition: A | Discharge status: Home / Self Care | Payer: 59 | Attending: Emergency Medicine | Admitting: Emergency Medicine

## 2022-12-16 ENCOUNTER — Encounter (HOSPITAL_COMMUNITY): Payer: Self-pay

## 2022-12-16 DIAGNOSIS — M778 Other enthesopathies, not elsewhere classified: Secondary | ICD-10-CM

## 2022-12-16 DIAGNOSIS — M79671 Pain in right foot: Secondary | ICD-10-CM

## 2022-12-16 DIAGNOSIS — M79672 Pain in left foot: Secondary | ICD-10-CM | POA: Diagnosis not present

## 2022-12-16 MED ORDER — TRAMADOL HCL 50 MG PO TABS
50.0000 mg | ORAL_TABLET | Freq: Three times a day (TID) | ORAL | 0 refills | Status: AC | PRN
Start: 2022-12-16 — End: 2022-12-21

## 2022-12-16 NOTE — ED Triage Notes (Signed)
Left foot pain x 3 days. Patient thinks she stepped down wrong on it. Pain shot through the foot and getting worse. Used icy hot with no relief. H/o plantar fascitis but the pain is in the top of the foot.

## 2022-12-16 NOTE — Discharge Instructions (Addendum)
Tramadol 3 times a day for pain that is not resolved with naproxen and Tylenol. May use naproxen 2 tabs twice daily and Tylenol 1000 mg 3 times a day Apply your diclofenac gel to top of foot 4 times daily as needed Ice 3 times a day with elevation Follow-up with podiatry if no improvement  We will call if there are any findings from radiology review of xray
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
## 2022-12-18 ENCOUNTER — Ambulatory Visit (INDEPENDENT_AMBULATORY_CARE_PROVIDER_SITE_OTHER): Payer: 59 | Admitting: Licensed Clinical Social Worker

## 2022-12-18 DIAGNOSIS — F3131 Bipolar disorder, current episode depressed, mild: Secondary | ICD-10-CM

## 2022-12-18 DIAGNOSIS — M545 Low back pain, unspecified: Secondary | ICD-10-CM | POA: Diagnosis not present

## 2022-12-19 ENCOUNTER — Encounter (HOSPITAL_COMMUNITY): Payer: Self-pay | Admitting: Licensed Clinical Social Worker

## 2022-12-19 ENCOUNTER — Ambulatory Visit (HOSPITAL_COMMUNITY): Payer: 59 | Admitting: Licensed Clinical Social Worker

## 2022-12-19 NOTE — Progress Notes (Signed)
   THERAPIST PROGRESS NOTE  Session Time: 2:30pm-3:15pm  Participation Level: Active  Behavioral Response: Well GroomedAlertIrritable  Type of Therapy: Individual Therapy  Treatment Goals addressed: Reduce frequency, intensity, and duration of depression symptoms so that daily functioning is improved   ProgressTowards Goals: Progressing  Interventions: CBT  Summary: Jordan [name] is a 42 y.o. female who presents with Bipolar I, depressed, mild.   Suicidal/Homicidal: Nowithout intent/plan  Therapist Response: Mohini engaged well in individual session in person with Jordan [name]. Jordan [name] utilized CBT to process thoughts, feelings, and interactions. Jordan [name] explored updates in relationships with friends, family, and partner. Jordan [name] [name] ongoing challenges with partner and noted that she is hopeful her partner will find another place to live soon, as living together is not working. Jordan [name] explored ways Jordan [name] continue to coping with partner's issues, or whether she just needs to end things. Jordan [name] validated frustration and noted continuing mistrust from partner about Jordan [name].  Jordan [name] that she is doing really well at work with [employer]. 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 Jordan [name] reflected the excitement and enthusiasm Jordan [name] feels about this work and she is feeling more confident in herself.   Plan: Return again in 1-2 weeks.  Diagnosis: Bipolar 1 disorder, depressed, mild (HCC)  Collaboration of Care: Patient refused AEB none required  Patient/Guardian was advised Release of Information must be obtained prior to any record release in order to collaborate their care with an outside provider. Patient/Guardian was advised if they have not already done so to contact the registration department to sign all necessary forms in order for [place] to release information regarding their care.   Consent: Patient/Guardian gives  verbal consent for treatment and assignment of benefits for services provided during this visit. Patient/Guardian expressed understanding and agreed to proceed.   [name] [place], LCSW 12/19/2022

## 2022-12-25 ENCOUNTER — Ambulatory Visit (HOSPITAL_COMMUNITY): Payer: 59 | Admitting: Licensed Clinical Social Worker

## 2022-12-25 DIAGNOSIS — M545 Low back pain, unspecified: Secondary | ICD-10-CM | POA: Diagnosis not present

## 2022-12-26 ENCOUNTER — Encounter: Payer: 59 | Admitting: Internal Medicine

## 2022-12-27 DIAGNOSIS — M545 Low back pain, unspecified: Secondary | ICD-10-CM | POA: Diagnosis not present

## 2022-12-31 DIAGNOSIS — M545 Low back pain, unspecified: Secondary | ICD-10-CM | POA: Diagnosis not present

## 2023-01-01 ENCOUNTER — Ambulatory Visit (INDEPENDENT_AMBULATORY_CARE_PROVIDER_SITE_OTHER): Payer: 59 | Admitting: Licensed Clinical Social Worker

## 2023-01-01 ENCOUNTER — Encounter (HOSPITAL_COMMUNITY): Payer: Self-pay | Admitting: Licensed Clinical Social Worker

## 2023-01-01 DIAGNOSIS — F3131 Bipolar disorder, current episode depressed, mild: Secondary | ICD-10-CM | POA: Diagnosis not present

## 2023-01-01 NOTE — Progress Notes (Signed)
   THERAPIST PROGRESS NOTE  Session Time: 4:30pm-5:30pm  Participation Level: Active  Behavioral Response: Well GroomedAlertEuthymic  Type of Therapy: Individual Therapy  Treatment Goals addressed: Reduce frequency, intensity, and duration of depression symptoms so that daily functioning is improved   ProgressTowards Goals: Progressing  Interventions: Motivational Interviewing  Summary: Jordan [name] is a 42 y.o. female who presents with Bipolar I, depressed, mild.   Suicidal/Homicidal: Nowithout intent/plan  Therapist Response: [name] engaged well in in person individual session.  Clinician utilized CBT to process thoughts feelings and behaviors. Clinician discussed relationship with partner and daughter noting ongoing concerns about their relationship.  [name] shared updates about her job noting that she is doing extremely well and enjoying that work.  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  [name] reports that her mood has been stable and she has been coping well with current stressors.  Plan: Return again in 1-2 weeks.  Diagnosis: Bipolar 1 disorder, depressed, mild (HCC)  Collaboration of Care: Medication Management AEB will provide updates to Dr. Lolly Mustache   Patient/Guardian was advised Release of Information must be obtained prior to any record release in order to collaborate their care with an outside provider. Patient/Guardian was advised if they have not already done so to contact the registration department to sign all necessary forms in order for [place] to release information regarding their care.   Consent: Patient/Guardian gives verbal consent for treatment and assignment of benefits for services provided during this visit. Patient/Guardian expressed understanding and agreed to proceed.   [name] [place], LCSW 01/01/2023

## 2023-01-02 ENCOUNTER — Encounter (HOSPITAL_COMMUNITY): Payer: Self-pay | Admitting: *Deleted

## 2023-01-02 ENCOUNTER — Encounter (HOSPITAL_COMMUNITY): Payer: Self-pay | Admitting: Licensed Clinical Social Worker

## 2023-01-03 DIAGNOSIS — M545 Low back pain, unspecified: Secondary | ICD-10-CM | POA: Diagnosis not present

## 2023-01-07 ENCOUNTER — Ambulatory Visit (INDEPENDENT_AMBULATORY_CARE_PROVIDER_SITE_OTHER): Payer: 59 | Admitting: Podiatry

## 2023-01-07 ENCOUNTER — Ambulatory Visit (INDEPENDENT_AMBULATORY_CARE_PROVIDER_SITE_OTHER): Payer: 59

## 2023-01-07 ENCOUNTER — Encounter: Payer: Self-pay | Admitting: Podiatry

## 2023-01-07 DIAGNOSIS — M722 Plantar fascial fibromatosis: Secondary | ICD-10-CM

## 2023-01-07 DIAGNOSIS — S92335A Nondisplaced fracture of third metatarsal bone, left foot, initial encounter for closed fracture: Secondary | ICD-10-CM | POA: Diagnosis not present

## 2023-01-07 DIAGNOSIS — S99922A Unspecified injury of left foot, initial encounter: Secondary | ICD-10-CM | POA: Diagnosis not present

## 2023-01-07 MED ORDER — TRIAMCINOLONE ACETONIDE 10 MG/ML IJ SUSP
10.0000 mg | Freq: Once | INTRAMUSCULAR | Status: AC
Start: 2023-01-07 — End: 2023-01-07
  Administered 2023-01-07: 10 mg via INTRA_ARTICULAR

## 2023-01-07 NOTE — Progress Notes (Signed)
Subjective:   Patient ID: Jordan [name], female   DOB: 42 y.o.   MRN: [id number]   HPI Patient states she hurt her foot 3 weeks ago and it has still been swelling in the middle of her foot and was given tentative possibility of a fracture.  Also has heel pain that is gotten worse over the last couple months   ROS      Objective:  Physical Exam  Neurovascular status intact with patient found to have inflammation pain plantar heel left and also is noted to have inflammation pain of the midfoot left which is more recent and may possibly be a fracture     Assessment:  Acute plantar fasciitis left and inflammation pain midfoot left possible fracture     Plan:  H&P x-ray taken and I went ahead today and I advised on ice therapy and rigid bottom shoes for the midfoot.  Sterile prep injected the plantar fascia 3 mg Kenalog 5 mg Xylocaine and sterile dressing applied  X-rays indicate suspicion around the third metatarsal base cuneiform for a possible subtle fracture of this area no indication Lisfranc's injury

## 2023-01-08 DIAGNOSIS — M545 Low back pain, unspecified: Secondary | ICD-10-CM | POA: Diagnosis not present

## 2023-01-09 ENCOUNTER — Ambulatory Visit (INDEPENDENT_AMBULATORY_CARE_PROVIDER_SITE_OTHER): Payer: 59 | Admitting: Licensed Clinical Social Worker

## 2023-01-09 DIAGNOSIS — F3131 Bipolar disorder, current episode depressed, mild: Secondary | ICD-10-CM | POA: Diagnosis not present

## 2023-01-10 DIAGNOSIS — M545 Low back pain, unspecified: Secondary | ICD-10-CM | POA: Diagnosis not present

## 2023-01-11 ENCOUNTER — Encounter (HOSPITAL_COMMUNITY): Payer: Self-pay | Admitting: Licensed Clinical Social Worker

## 2023-01-11 NOTE — Progress Notes (Addendum)
Virtual Visit via Video Note  I connected with TIALISA LIENHARD on 01/09/23 at  4:30 PM EST by a video enabled telemedicine application and verified that I am speaking with the correct person using two identifiers.  Location: Patient: home Provider: home office   I discussed the limitations of evaluation and management by telemedicine and the availability of in person appointments. The patient expressed understanding and agreed to proceed.   I discussed the assessment and treatment plan with the patient. The patient was provided an opportunity to ask questions and all were answered. The patient agreed with the plan and demonstrated an understanding of the instructions.   The patient was advised to call back or seek an in-person evaluation if the symptoms worsen or if the condition fails to improve as anticipated.  I provided 45 minutes of non-face-to-face time during this encounter.   [name], LCSW   THERAPIST PROGRESS NOTE  Session Time: 4:30pm-5:30pm  Participation Level: Active  Behavioral Response: Well GroomedAlertEuthymic and Irritable  Type of Therapy: Individual Therapy  Treatment Goals addressed: Reduce frequency, intensity, and duration of depression symptoms so that daily functioning is improved   ProgressTowards Goals: Progressing  Interventions: CBT  Summary: DARLETHA SEAMON is a 42 y.o. female who presents with Bipolar I, depressed, mild.   Suicidal/Homicidal: Nowithout intent/plan  Therapist Response: Clinician utilized CBT to engage with Enrique Sack for individual virtual session.  Jamaira engaged well and reported many updates both positive and somewhat disappointing.  Dezaray shared updates with work and school, noting that she is really enjoying success in these programs and she looks forward to doing even more.  She shared increase in self-esteem and sense of self worth since joining this program and learning about carpentry.  Clinician explored the  level of support Braelee receives from her family.  Carver shared ongoing disappointment that her partner and her family are not as supportive as she had they would be.  Clinician processed recent challenging interactions with partner and noted that Estellar has decided to wait it out rather than end the relationship this time.  Clinician validated that decision and also utilized reality testing.  Clinician again encouraged counseling for partner.  Maudine shared ongoing motivation to complete school and to build her own home.  Plan: Return again in 1 weeks.  Diagnosis: Bipolar 1 disorder, depressed, mild (HCC)  Collaboration of Care: Patient refused AEB none required  Patient/Guardian was advised Release of Information must be obtained prior to any record release in order to collaborate their care with an outside provider. Patient/Guardian was advised if they have not already done so to contact the registration department to sign all necessary forms in order for [place] to release information regarding their care.   Consent: Patient/Guardian gives verbal consent for treatment and assignment of benefits for services provided during this visit. Patient/Guardian expressed understanding and agreed to proceed.   [name] [place], LCSW 01/11/2023

## 2023-01-13 ENCOUNTER — Other Ambulatory Visit: Payer: Self-pay | Admitting: Internal Medicine

## 2023-01-13 ENCOUNTER — Other Ambulatory Visit (HOSPITAL_COMMUNITY): Payer: Self-pay | Admitting: Psychiatry

## 2023-01-13 DIAGNOSIS — M797 Fibromyalgia: Secondary | ICD-10-CM

## 2023-01-13 DIAGNOSIS — F419 Anxiety disorder, unspecified: Secondary | ICD-10-CM

## 2023-01-13 DIAGNOSIS — F3132 Bipolar disorder, current episode depressed, moderate: Secondary | ICD-10-CM

## 2023-01-13 DIAGNOSIS — F4310 Post-traumatic stress disorder, unspecified: Secondary | ICD-10-CM

## 2023-01-15 ENCOUNTER — Ambulatory Visit (INDEPENDENT_AMBULATORY_CARE_PROVIDER_SITE_OTHER): Payer: 59 | Admitting: Licensed Clinical Social Worker

## 2023-01-15 DIAGNOSIS — F3131 Bipolar disorder, current episode depressed, mild: Secondary | ICD-10-CM

## 2023-01-15 DIAGNOSIS — M545 Low back pain, unspecified: Secondary | ICD-10-CM | POA: Diagnosis not present

## 2023-01-16 ENCOUNTER — Encounter (HOSPITAL_COMMUNITY): Payer: Self-pay | Admitting: Licensed Clinical Social Worker

## 2023-01-16 NOTE — Progress Notes (Signed)
   THERAPIST PROGRESS NOTE  Session Time: 3:30pm-4:20pm  Participation Level: Active  Behavioral Response: Well GroomedAlertIrritable  Type of Therapy: Individual Therapy  Treatment Goals addressed: Reduce frequency, intensity, and duration of depression symptoms so that daily functioning is improved   ProgressTowards Goals: Progressing  Interventions: CBT  Summary: AHNESTY GAPPA is a 42 y.o. female who presents with Bipolar I, depressed.   Suicidal/Homicidal: Nowithout intent/plan  Therapist Response: Jennessa engaged well in individual and personal session with clinician.  Clinician utilized CBT to process thoughts feelings and interactions over the past week.  Clinician identified ongoing challenges with partner, noting that she is no longer willing to remain in this relationship.  Clinician explored recent challenging interactions that led to the break-up.  Clinician reflected disappointment and frustration that partner was not nearly as supportive as strangers and coworkers have been to her.  Deion shared significant progress in her job and noted that she continues to gain a positive reputation and responsibility.  Enrique Sack processed experience with public speaking at her child's graduation from "[place] built [place]".  Clinician reflected the pride and excitement that she and her child are doing the same thing and will be working together.  Plan: Return again in 1 weeks.  Diagnosis: Bipolar 1 disorder, depressed, mild (HCC)  Collaboration of Care: Patient refused AEB none required  Patient/Guardian was advised Release of Information must be obtained prior to any record release in order to collaborate their care with an outside provider. Patient/Guardian was advised if they have not already done so to contact the registration department to sign all necessary forms in order for [place] to release information regarding their care.   Consent: Patient/Guardian gives verbal consent for  treatment and assignment of benefits for services provided during this visit. Patient/Guardian expressed understanding and agreed to proceed.   [name] [place], LCSW 01/16/2023

## 2023-01-21 ENCOUNTER — Encounter: Payer: 59 | Admitting: Internal Medicine
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
## 2023-01-22 DIAGNOSIS — M545 Low back pain, unspecified: Secondary | ICD-10-CM | POA: Diagnosis not present

## 2023-01-23 ENCOUNTER — Encounter (HOSPITAL_COMMUNITY): Payer: Self-pay | Admitting: Licensed Clinical Social Worker

## 2023-01-23 ENCOUNTER — Ambulatory Visit (HOSPITAL_COMMUNITY): Payer: 59 | Admitting: Licensed Clinical Social Worker

## 2023-01-23 DIAGNOSIS — F3131 Bipolar disorder, current episode depressed, mild: Secondary | ICD-10-CM | POA: Diagnosis not present

## 2023-01-23 NOTE — Progress Notes (Signed)
  Virtual Visit via Video Note  I connected with Jordan [name] on 01/23/23 at  4:30 PM EST by a video enabled telemedicine application and verified that I am speaking with the correct person using two identifiers.  Location: Patient: home Provider: home office   I discussed the limitations of evaluation and management by telemedicine and the availability of in person appointments. The patient expressed understanding and agreed to proceed.   I discussed the assessment and treatment plan with the patient. The patient was provided an opportunity to ask questions and all were answered. The patient agreed with the plan and demonstrated an understanding of the instructions.   The patient was advised to call back or seek an in-person evaluation if the symptoms worsen or if the condition fails to improve as anticipated.  I provided 55 minutes of non-face-to-face time during this encounter.   [name], LCSW   THERAPIST PROGRESS NOTE  Session Time: 4:30pm-5:25pm  Participation Level: Active  Behavioral Response: Well GroomedAlertEuthymic and Irritable  Type of Therapy: Individual Therapy  Treatment Goals addressed: Reduce frequency, intensity, and duration of depression symptoms so that daily functioning is improved   ProgressTowards Goals: Progressing  Interventions: CBT  Summary: Jordan [name] is a 42 y.o. female who presents with Bipolar affective Disorder, depressed, mild.   Suicidal/Homicidal: Nowithout intent/plan  Therapist Response: Jordan [name] engaged well in individual virtual session with clinician.  Clinician explored moods interactions and thoughts over the past week.  Clinician discussed relationships within the family in the household.  Clinician identified the ongoing communication issues and understanding of each other that is happening in Jordan [name]'s relationship with partner.  Clinician identified that there are some significant issues with communication and  empathy for each other.  Wave shared that otherwise life is going very well.  She shared updates with work and noted that she is getting some significant positive reinforcement from her bosses, board members, and other community members who are seeing her work.  Jordan [name] shared feeling very proud of herself and well accomplished.  She identified excitement and joy even when put in a situation that is new.  Plan: Return again in 1 weeks.  Diagnosis: Bipolar 1 disorder, depressed, mild (HCC)  Collaboration of Care: Medication Management AEB clinician informed nurse that Jordan [name] is in need of an appointment with Dr. Lolly Mustache as well as a refill on Depakote prescription.  Patient/Guardian was advised Release of Information must be obtained prior to any record release in order to collaborate their care with an outside provider. Patient/Guardian was advised if they have not already done so to contact the registration department to sign all necessary forms in order for [place] to release information regarding their care.   Consent: Patient/Guardian gives verbal consent for treatment and assignment of benefits for services provided during this visit. Patient/Guardian expressed understanding and agreed to proceed.   Jordan Heck [place], LCSW 01/23/2023

## 2023-01-29 ENCOUNTER — Ambulatory Visit (INDEPENDENT_AMBULATORY_CARE_PROVIDER_SITE_OTHER): Payer: 59 | Admitting: Licensed Clinical Social Worker

## 2023-01-29 DIAGNOSIS — F3131 Bipolar disorder, current episode depressed, mild: Secondary | ICD-10-CM

## 2023-01-31 ENCOUNTER — Encounter (HOSPITAL_COMMUNITY): Payer: Self-pay | Admitting: Licensed Clinical Social Worker

## 2023-01-31 NOTE — Progress Notes (Signed)
Virtual Visit via Video Note  I connected with Jordan [name] on 01/31/23 at  4:30 PM EST by a video enabled telemedicine application and verified that I am speaking with the correct person using two identifiers.  Location: Patient: home Provider: home office   I discussed the limitations of evaluation and management by telemedicine and the availability of in person appointments. The patient expressed understanding and agreed to proceed.   I discussed the assessment and treatment plan with the patient. The patient was provided an opportunity to ask questions and all were answered. The patient agreed with the plan and demonstrated an understanding of the instructions.   The patient was advised to call back or seek an in-person evaluation if the symptoms worsen or if the condition fails to improve as anticipated.  I provided 45 minutes of non-face-to-face time during this encounter.   Jordan Melter, LCSW   THERAPIST PROGRESS NOTE  Session Time: 4:30pm-5:15pm  Participation Level: Active  Behavioral Response: Well GroomedAlertEuthymic and Irritable  Type of Therapy: Individual Therapy  Treatment Goals addressed: Reduce frequency, intensity, and duration of depression symptoms so that daily functioning is improved   ProgressTowards Goals: Progressing  Interventions: CBT  Summary: Jordan [name] is a 42 y.o. female who presents with Bipolar I, depressed.   Suicidal/Homicidal: Nowithout intent/plan  Therapist Response: Jordan [name] engaged well in individual virtual session with Jordan [name].  Jordan [name] utilized CBT to process thoughts feelings and interactions.  Jordan [name] discussed updates with work and school.  Jordan [name] shared that work has been wonderful and she continues to learn something new every day.  Jordan [name] reflected that her work has gotten the attention of some very important people in the agency and community.  Jordan [name] shared that she is willing to accept the positive  attention, however it feels weird.  Jordan [name] encouraged Jordan [name] to continue doing what she is doing and to be open to possibilities and opportunities.  Jordan [name] explored relationship with partner.  Jordan [name] shared updates and noted ongoing frustration and irritation about how her partner treats her.  Jordan [name] shared that as soon as her partner is able to find another place to live, she will be gone, and Jordan [name] will have her harm to herself again.  Jordan [name] reviewed coping skills for stress management.  Plan: Return again in 1-2 weeks.  Diagnosis: Bipolar 1 disorder, depressed, mild (HCC)  Collaboration of Care: Psychiatrist AEB updated Dr. Lolly Mustache  Patient/Guardian was advised Release of Information must be obtained prior to any record release in order to collaborate their care with an outside provider. Patient/Guardian was advised if they have not already done so to contact the registration department to sign all necessary forms in order for [place] to release information regarding their care.   Consent: Patient/Guardian gives verbal consent for treatment and assignment of benefits for services provided during this visit. Patient/Guardian expressed understanding and agreed to proceed.   Jordan Heck [place], LCSW 01/31/2023

## 2023-02-05 ENCOUNTER — Encounter: Payer: 59 | Admitting: Student

## 2023-02-05 ENCOUNTER — Other Ambulatory Visit (HOSPITAL_COMMUNITY): Payer: Self-pay | Admitting: Psychiatry

## 2023-02-05 DIAGNOSIS — F419 Anxiety disorder, unspecified: Secondary | ICD-10-CM

## 2023-02-05 DIAGNOSIS — F4310 Post-traumatic stress disorder, unspecified: Secondary | ICD-10-CM

## 2023-02-05 DIAGNOSIS — M545 Low back pain, unspecified: Secondary | ICD-10-CM | POA: Diagnosis not present

## 2023-02-05 DIAGNOSIS — F3132 Bipolar disorder, current episode depressed, moderate: Secondary | ICD-10-CM

## 2023-02-06 ENCOUNTER — Ambulatory Visit (INDEPENDENT_AMBULATORY_CARE_PROVIDER_SITE_OTHER): Payer: 59 | Admitting: Licensed Clinical Social Worker

## 2023-02-06 ENCOUNTER — Encounter (HOSPITAL_COMMUNITY): Payer: Self-pay | Admitting: Psychiatry

## 2023-02-06 DIAGNOSIS — F3131 Bipolar disorder, current episode depressed, mild: Secondary | ICD-10-CM | POA: Diagnosis not present

## 2023-02-07 ENCOUNTER — Other Ambulatory Visit (HOSPITAL_COMMUNITY): Payer: Self-pay | Admitting: *Deleted

## 2023-02-07 DIAGNOSIS — F419 Anxiety disorder, unspecified: Secondary | ICD-10-CM

## 2023-02-07 DIAGNOSIS — F3132 Bipolar disorder, current episode depressed, moderate: Secondary | ICD-10-CM

## 2023-02-07 DIAGNOSIS — F4310 Post-traumatic stress disorder, unspecified: Secondary | ICD-10-CM

## 2023-02-07 MED ORDER — DIVALPROEX SODIUM ER 500 MG PO TB24: 1000.0000 mg | ORAL_TABLET | Freq: Every day | ORAL | 0 refills | Status: DC

## 2023-02-11 ENCOUNTER — Other Ambulatory Visit (HOSPITAL_COMMUNITY): Payer: Self-pay | Admitting: *Deleted

## 2023-02-11 ENCOUNTER — Encounter: Payer: Self-pay | Admitting: Internal Medicine

## 2023-02-11 ENCOUNTER — Telehealth (HOSPITAL_COMMUNITY): Payer: Self-pay | Admitting: Psychiatry

## 2023-02-11 ENCOUNTER — Ambulatory Visit (INDEPENDENT_AMBULATORY_CARE_PROVIDER_SITE_OTHER): Payer: 59 | Admitting: Internal Medicine

## 2023-02-11 VITALS — BP 107/71 | HR 66 | Temp 98.1°F | Ht 69.0 in | Wt 284.3 lb

## 2023-02-11 DIAGNOSIS — Z23 Encounter for immunization: Secondary | ICD-10-CM | POA: Diagnosis not present

## 2023-02-11 DIAGNOSIS — K219 Gastro-esophageal reflux disease without esophagitis: Secondary | ICD-10-CM | POA: Diagnosis not present

## 2023-02-11 DIAGNOSIS — R748 Abnormal levels of other serum enzymes: Secondary | ICD-10-CM

## 2023-02-11 DIAGNOSIS — F3132 Bipolar disorder, current episode depressed, moderate: Secondary | ICD-10-CM

## 2023-02-11 DIAGNOSIS — F319 Bipolar disorder, unspecified: Secondary | ICD-10-CM

## 2023-02-11 DIAGNOSIS — M797 Fibromyalgia: Secondary | ICD-10-CM

## 2023-02-11 DIAGNOSIS — F419 Anxiety disorder, unspecified: Secondary | ICD-10-CM

## 2023-02-11 DIAGNOSIS — F4310 Post-traumatic stress disorder, unspecified: Secondary | ICD-10-CM

## 2023-02-11 DIAGNOSIS — T50905D Adverse effect of unspecified drugs, medicaments and biological substances, subsequent encounter: Secondary | ICD-10-CM

## 2023-02-11 DIAGNOSIS — M25561 Pain in right knee: Secondary | ICD-10-CM

## 2023-02-11 DIAGNOSIS — Z0000 Encounter for general adult medical examination without abnormal findings: Secondary | ICD-10-CM

## 2023-02-11 MED ORDER — DIVALPROEX SODIUM ER 500 MG PO TB24: 1000.0000 mg | ORAL_TABLET | Freq: Every day | ORAL | 0 refills | Status: DC

## 2023-02-11 MED ORDER — MELOXICAM 7.5 MG PO TABS: 7.5000 mg | ORAL_TABLET | Freq: Every day | ORAL | 0 refills | Status: DC

## 2023-02-11 NOTE — Telephone Encounter (Signed)
Done

## 2023-02-11 NOTE — Telephone Encounter (Signed)
Please provide bridge until her next appointment.
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
## 2023-02-11 NOTE — Patient Instructions (Signed)
[name], it was a pleasure seeing you today! You endorsed feeling well today. Below are some of the things we talked about this visit. We look forward to seeing you in the follow up appointment!  Today we discussed: For your knee pain, I prescribed you mobic. Take once daily as needed. I have sent in a referral to PT. I will also get an x ray to look at the knee to see if there is any arthritis in the joint.   Continue taking your other medications.   I have ordered the following labs today:   Lab Orders         CMP14 + Anion Gap       Referrals ordered today:    Referral Orders         Ambulatory referral to Physical Therapy      I have ordered the following medication/changed the following medications:   Stop the following medications: There are no discontinued medications.   Start the following medications: Meds ordered this encounter  Medications   meloxicam (MOBIC) 7.5 MG tablet    Sig: Take 1 tablet (7.5 mg total) by mouth daily.    Dispense:  30 tablet    Refill:  0     Follow-up: 3 month follow up with PCP   Please make sure to arrive 15 minutes prior to your next appointment. If you arrive late, you may be asked to reschedule.   We look forward to seeing you next time. Please call our clinic at [phone] if you have any questions or concerns. The best time to call is Monday-Friday from 9am-4pm, but there is someone available 24/7. If after hours or the weekend, call the main hospital number and ask for the Internal Medicine Resident On-Call. If you need medication refills, please notify your pharmacy one week in advance and they will send [place] a request.  Thank you for letting us take part in your care. Wishing you the best!  Thank you, Gwenevere Abbot, MD

## 2023-02-11 NOTE — Telephone Encounter (Signed)
Patient was called to reschedule appointment. Patient stated she needs refill on Depakote medication. Patient's next appointment is 03/01/2023. Please advise

## 2023-02-12 ENCOUNTER — Ambulatory Visit (HOSPITAL_COMMUNITY)
Admission: RE | Admit: 2023-02-12 | Discharge: 2023-02-12 | Disposition: A | Discharge status: Home / Self Care | Payer: 59 | Source: Ambulatory Visit | Attending: Student in an Organized Health Care Education/Training Program | Admitting: Student in an Organized Health Care Education/Training Program

## 2023-02-12 ENCOUNTER — Other Ambulatory Visit (HOSPITAL_COMMUNITY): Payer: Self-pay | Admitting: Psychiatry

## 2023-02-12 DIAGNOSIS — F419 Anxiety disorder, unspecified: Secondary | ICD-10-CM

## 2023-02-12 DIAGNOSIS — F4310 Post-traumatic stress disorder, unspecified: Secondary | ICD-10-CM

## 2023-02-12 DIAGNOSIS — M25561 Pain in right knee: Secondary | ICD-10-CM

## 2023-02-12 DIAGNOSIS — M545 Low back pain, unspecified: Secondary | ICD-10-CM | POA: Diagnosis not present

## 2023-02-12 DIAGNOSIS — F3132 Bipolar disorder, current episode depressed, moderate: Secondary | ICD-10-CM

## 2023-02-12 LAB — CMP14 + ANION GAP
ALT: 9 [IU]/L (ref 0–32)
AST: 13 [IU]/L (ref 0–40)
Albumin: 3.9 g/dL (ref 3.9–4.9)
Alkaline Phosphatase: 44 [IU]/L (ref 44–121)
Anion Gap: 12 mmol/L (ref 10.0–18.0)
BUN/Creatinine Ratio: 17 (ref 9–23)
BUN: 13 mg/dL (ref 6–24)
Bilirubin Total: <0.2 mg/dL (ref 0.0–1.2)
CO2: 25 mmol/L (ref 20–29)
Calcium: 9 mg/dL (ref 8.7–10.2)
Chloride: 102 mmol/L (ref 96–106)
Creatinine, Ser: 0.78 mg/dL (ref 0.57–1.00)
Globulin, Total: 2.1 g/dL (ref 1.5–4.5)
Glucose: 95 mg/dL (ref 70–99)
Potassium: 4.5 mmol/L (ref 3.5–5.2)
Sodium: 139 mmol/L (ref 134–144)
Total Protein: 6 g/dL (ref 6.0–8.5)
eGFR: 97 mL/min/{1.73_m2} (ref >59)

## 2023-02-13 NOTE — Assessment & Plan Note (Signed)
Pt currently on depakote 1000 mg daily. Will check LFTs to ensure no risk of hepatotoxicity. LFTs in normal range.

## 2023-02-13 NOTE — Assessment & Plan Note (Signed)
Flu shot given this visit. 

## 2023-02-13 NOTE — Assessment & Plan Note (Signed)
Pt with hx of fibromyalgia that is well controlled on her regimen of Lyrica 150 mg BID, robaxin 500 mg q6hr prn.

## 2023-02-13 NOTE — Assessment & Plan Note (Addendum)
Patient presents with 2 weeks of right knee pain. States it started suddenly but pt doesn't realize any specific injury. 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 Exam shows no swelling or erythema but medial knee tenderness. Anterior and posterior drawer test are negative and McMurray test is positive. Suspect meniscal injury. Will have pt use NSAIDs, APAP, and thermal therapy and will refer to physical therapy. Less concern for OA given acute to subacute nature but will get plain radiography.   Addendum: Knee Xray without any acute pathology and no joint space narrowing.

## 2023-02-13 NOTE — Assessment & Plan Note (Signed)
Well controlled with lifestyle modifications.

## 2023-02-14 DIAGNOSIS — M545 Low back pain, unspecified: Secondary | ICD-10-CM | POA: Diagnosis not present

## 2023-02-14 NOTE — Progress Notes (Signed)
 Internal Medicine Clinic Attending  Case discussed with the resident physician at the time of the visit.  We reviewed the patient's history, exam, and pertinent patient test results.  I agree with the assessment, diagnosis, and plan of care documented in the resident's note.

## 2023-02-15 ENCOUNTER — Encounter (HOSPITAL_COMMUNITY): Payer: Self-pay | Admitting: Licensed Clinical Social Worker

## 2023-02-15 ENCOUNTER — Other Ambulatory Visit: Payer: Self-pay | Admitting: Internal Medicine

## 2023-02-15 ENCOUNTER — Telehealth (HOSPITAL_COMMUNITY): Payer: 59 | Admitting: Psychiatry

## 2023-02-15 DIAGNOSIS — R928 Other abnormal and inconclusive findings on diagnostic imaging of breast: Secondary | ICD-10-CM
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
## 2023-02-22 DIAGNOSIS — Z1231 Encounter for screening mammogram for malignant neoplasm of breast: Secondary | ICD-10-CM

## 2023-02-26 ENCOUNTER — Ambulatory Visit (HOSPITAL_COMMUNITY): Payer: 59 | Admitting: Licensed Clinical Social Worker

## 2023-02-26 DIAGNOSIS — M545 Low back pain, unspecified: Secondary | ICD-10-CM | POA: Diagnosis not present

## 2023-02-28 DIAGNOSIS — M545 Low back pain, unspecified: Secondary | ICD-10-CM | POA: Diagnosis not present

## 2023-03-01 ENCOUNTER — Encounter (HOSPITAL_COMMUNITY): Payer: Self-pay | Admitting: Psychiatry

## 2023-03-01 ENCOUNTER — Telehealth (HOSPITAL_BASED_OUTPATIENT_CLINIC_OR_DEPARTMENT_OTHER): Payer: 59 | Admitting: Psychiatry

## 2023-03-01 VITALS — Wt 284.0 lb

## 2023-03-01 DIAGNOSIS — F419 Anxiety disorder, unspecified: Secondary | ICD-10-CM

## 2023-03-01 DIAGNOSIS — F4310 Post-traumatic stress disorder, unspecified: Secondary | ICD-10-CM

## 2023-03-01 DIAGNOSIS — F3132 Bipolar disorder, current episode depressed, moderate: Secondary | ICD-10-CM | POA: Diagnosis not present

## 2023-03-01 MED ORDER — DIVALPROEX SODIUM ER 500 MG PO TB24: 1000.0000 mg | ORAL_TABLET | Freq: Every day | ORAL | 3 refills | Status: DC

## 2023-03-01 MED ORDER — HYDROXYZINE HCL 10 MG PO TABS: 20.0000 mg | ORAL_TABLET | Freq: Every day | ORAL | 3 refills | Status: DC | PRN
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
## 2023-03-05 ENCOUNTER — Encounter (HOSPITAL_COMMUNITY): Payer: Self-pay | Admitting: Licensed Clinical Social Worker

## 2023-03-05 ENCOUNTER — Ambulatory Visit (HOSPITAL_COMMUNITY): Payer: 59 | Admitting: Licensed Clinical Social Worker

## 2023-03-05 DIAGNOSIS — F3131 Bipolar disorder, current episode depressed, mild: Secondary | ICD-10-CM | POA: Diagnosis not present

## 2023-03-05 DIAGNOSIS — M545 Low back pain, unspecified: Secondary | ICD-10-CM | POA: Diagnosis not present

## 2023-03-05 NOTE — Progress Notes (Signed)
 Virtual Visit via Video Note  I connected with Jordan [name] on 03/05/23 at  4:30 PM EST by a video enabled telemedicine application and verified that I am speaking with the correct person using two identifiers.  Location: Patient: home Provider: office   I discussed the limitations of evaluation and management by telemedicine and the availability of in person appointments. The patient expressed understanding and agreed to proceed.   I discussed the assessment and treatment plan with the patient. The patient was provided an opportunity to ask questions and all were answered. The patient agreed with the plan and demonstrated an understanding of the instructions.   The patient was advised to call back or seek an in-person evaluation if the symptoms worsen or if the condition fails to improve as anticipated.  I provided 45 minutes of non-face-to-face time during this encounter.   Jordan JONELLE Rosser, LCSW   THERAPIST PROGRESS NOTE  Session Time: 4:45pm-5:30pm  Participation Level: Active  Behavioral Response: NeatAlertIrritable  Type of Therapy: Individual Therapy  Treatment Goals addressed:  Reduce frequency, intensity, and duration of depression symptoms so that daily functioning is improved   ProgressTowards Goals: Progressing  Interventions: CBT  Summary: Jordan [name] is a 43 y.o. female who presents with Bipolar I, depressed.   Suicidal/Homicidal: Nowithout intent/plan  Therapist Response: Jordan [name] engaged well in individual virtual session with clinician.  Clinician utilized CBT to process thoughts feelings and interactions.  Clinician explored relationship with partner and the changes she would like to see.  Clinician discussed locus of control for Jordan [name] and identified the importance of managing her self and allowing others to manage themselves.  Clinician identified ongoing frustration with partner.  Clinician provided time and space for Jordan [name] to share updates and to  process through challenging interactions.  Plan: Return again in 2 weeks.  Diagnosis: Bipolar 1 disorder, depressed, mild (HCC)  Collaboration of Care: Psychiatrist AEB reviewed Dr. Veto note  Patient/Guardian was advised Release of Information must be obtained prior to any record release in order to collaborate their care with an outside provider. Patient/Guardian was advised if they have not already done so to contact the registration department to sign all necessary forms in order for us  to release information regarding their care.   Consent: Patient/Guardian gives verbal consent for treatment and assignment of benefits for services provided during this visit. Patient/Guardian expressed understanding and agreed to proceed.   Jordan JONELLE [place], LCSW 03/05/2023

## 2023-03-11 ENCOUNTER — Other Ambulatory Visit: Payer: Self-pay | Admitting: Internal Medicine

## 2023-03-11 DIAGNOSIS — M25561 Pain in right knee: Secondary | ICD-10-CM

## 2023-03-12 ENCOUNTER — Ambulatory Visit (HOSPITAL_COMMUNITY): Payer: 59 | Admitting: Licensed Clinical Social Worker

## 2023-03-13 ENCOUNTER — Encounter (HOSPITAL_COMMUNITY): Payer: Self-pay | Admitting: Licensed Clinical Social Worker

## 2023-03-13 ENCOUNTER — Ambulatory Visit (INDEPENDENT_AMBULATORY_CARE_PROVIDER_SITE_OTHER): Payer: 59 | Admitting: Licensed Clinical Social Worker

## 2023-03-13 DIAGNOSIS — F3131 Bipolar disorder, current episode depressed, mild: Secondary | ICD-10-CM

## 2023-03-13 NOTE — Progress Notes (Signed)
Virtual Visit via Video Note  I connected with Jordan [name] on 03/13/23 at  2:30 PM EST by a video enabled telemedicine application and verified that I am speaking with the correct person using two identifiers.  Location: Patient: work Provider: home office   I discussed the limitations of evaluation and management by telemedicine and the availability of in person appointments. The patient expressed understanding and agreed to proceed.     I discussed the assessment and treatment plan with the patient. The patient was provided an opportunity to ask questions and all were answered. The patient agreed with the plan and demonstrated an understanding of the instructions.   The patient was advised to call back or seek an in-person evaluation if the symptoms worsen or if the condition fails to improve as anticipated.  I provided 20 minutes of non-face-to-face time during this encounter.   [name], LCSW   THERAPIST PROGRESS NOTE  Session Time: 2:45-3:05pm  Participation Level: Active  Behavioral Response: NeatAlertEuthymic  Type of Therapy: Individual Therapy  Treatment Goals addressed: Reduce frequency, intensity, and duration of depression symptoms so that daily functioning is improved   ProgressTowards Goals: Progressing  Interventions: CBT  Summary: Jordan [name] is a 43 y.o. female who presents with Bipolar I, depressed, mild.   Suicidal/Homicidal: Nowithout intent/plan  Therapist Response: Jordan [name] engaged well in individual virtual session.  Clinician utilized CBT to process thoughts feelings and behaviors.  Clinician explored use of medication as prescribed.  Jordan [name] shared her medication is crucial in order to maintain balanced mood especially with partner.  Clinician discussed updates with family and work.  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  Clinician identified the  movement forward in Jordan [name]'s life, career, and opportunities.  Plan: Return again in 1-2 weeks.  Diagnosis: Bipolar 1 disorder, depressed, mild (HCC)  Collaboration of Care: Patient refused AEB none required  Patient/Guardian was advised Release of Information must be obtained prior to any record release in order to collaborate their care with an outside provider. Patient/Guardian was advised if they have not already done so to contact the registration department to sign all necessary forms in order for [place] to release information regarding their care.   Consent: Patient/Guardian gives verbal consent for treatment and assignment of benefits for services provided during this visit. Patient/Guardian expressed understanding and agreed to proceed.   [name] [place], LCSW 03/13/2023

## 2023-03-15 ENCOUNTER — Telehealth: Payer: Self-pay | Admitting: *Deleted

## 2023-03-15 NOTE — Telephone Encounter (Signed)
-----   Message -----From: Ula Lingo: 03/14/2023   2:29 PM ESTTo: Imp Front Desk PoolSubject: Appointment Request                          Appointment Request From: Jordan [name] Provider: Reymundo Poll [facility]]Preferred Date Range: 03/15/2023 - 1/31/2025Preferred Times: Monday Afternoon, Tuesday Afternoon, Wednesday Afternoon, Thursday Afternoon, Friday AfternoonReason for visit: Office VisitHealth Maintenance Topic:Comments:I am having a clear discharge. I get off work at [employer].

## 2023-03-15 NOTE — Telephone Encounter (Signed)
I called pt to let her know first available appt will be Monday 2/27 @ 1445 Pm- appt accepted.

## 2023-03-18 ENCOUNTER — Ambulatory Visit (INDEPENDENT_AMBULATORY_CARE_PROVIDER_SITE_OTHER): Payer: 59 | Admitting: Student

## 2023-03-18 ENCOUNTER — Encounter: Payer: Self-pay | Admitting: Student

## 2023-03-18 ENCOUNTER — Other Ambulatory Visit (HOSPITAL_COMMUNITY)
Admission: RE | Admit: 2023-03-18 | Discharge: 2023-03-18 | Disposition: A | Discharge status: Home / Self Care | Payer: 59 | Source: Ambulatory Visit | Attending: Internal Medicine | Admitting: Internal Medicine

## 2023-03-18 VITALS — BP 114/62 | HR 85 | Ht 69.0 in | Wt 285.3 lb

## 2023-03-18 DIAGNOSIS — N898 Other specified noninflammatory disorders of vagina: Secondary | ICD-10-CM

## 2023-03-18 DIAGNOSIS — F319 Bipolar disorder, unspecified: Secondary | ICD-10-CM | POA: Diagnosis not present

## 2023-03-18 NOTE — Assessment & Plan Note (Addendum)
Patient has a past medical history of bipolar disorder.  She is on Depakote 1000 mg daily.  She reports that for the past few weeks she has been out of her Depakote, and has developed chest pains associated with palpitations, sweats, and shortness of breath.  She states she has recently she has been having a lot of stressors.  She states these are episodic chest pains that do not worsen with exertion.  She describes as a sharp type pain.  She feels like these are panic attacks.  Of note, patient did start back her Depakote after running out of her Depakote.  On exam, patient does not have any chest tenderness.  Her heart and lung sounds clear and regular.  Did consider if this could be thyroid related, but less likely given not constant.  Less likely cardiac related given history is atypical.  This is episodic, so less likely related to a pneumonia or any infectious etiology.  Likely this is related to her stress and her having panic attacks.  GAD-7: 4.  PHQ 9:5  Plan: -Patient has therapy appointment tomorrow -Patient encouraged to use hydroxyzine as needed for anxiety attacks -Continue Depakote 1000 mg daily

## 2023-03-18 NOTE — Patient Instructions (Addendum)
Thank you, Jordan [name] for allowing [place] to provide your care today!  We will call you with the results of the self swab and if you will need antibiotics to help clear it. If so, we will send that prescription to your pharmacy.   We think that the chest pain you've been having is likely anxiety and stress related. Keep working hard to manage life stressors as you have been doing and let Shanda Bumps know about these episodes tomorrow. When these episodes happen, you can take your hydroxyzine, which should help you feel better sooner.     Thank you for trusting me with your care. Wishing you the best!   Thea Alken, MS3

## 2023-03-18 NOTE — Progress Notes (Addendum)
CC: Vaginal discharge and chest pain  HPI:  Jordan [name] is a 43 y.o. female with a past medical history of reactive airway disease, GERD, hyperlipidemia, fibromyalgia who presents for concerns of vaginal discharge.  Please see assessment and plan for full HPI.  Medications: Fibromyalgia: Voltaren gel, meloxicam 7.5 mg daily, Robaxin 500 mg every 6 hours as needed, Lyrica 150 mg twice daily Bipolar disorder: Depakote 1000 mg daily  Past Medical History:  Diagnosis Date   Alcohol abuse    Allergic rhinitis    pt takes flonase for episodes, no episodes in 2012   Anemia    Anxiety    Basal cell carcinoma    recurrent in left maxillary, has had 5 surguries, last one 2003   Basal cell nevus syndrome    dx age 28   Bipolar 1 disorder (HCC)    BPPV (benign paroxysmal positional vertigo) 03/01/2016   Candidiasis, vagina    recurrent, pt has made lifestyle modifications and none recently (as of 8/12)   Cervicitis 03/19/2019   Cyst of nasal sinus    Depression    B-Polar   Fatigue 08/11/2020   Fibroma    left ovarian   Fibromyalgia    Gardnerella infection    + in 08/08   GERD (gastroesophageal reflux disease)    occ   Hematoma, subungual, finger, right, initial encounter 01/23/2021   Herniated disc    Hyperlipidemia    Joint pain    Lactose intolerance    Obesity    Ovarian cyst 08/23/2020   OVARIAN CYSTECTOMY, HX OF 03/01/2006   ovarian fibroma-left 1996, L ovary and fallopian tube removed     Paronychia of third finger of left hand 07/28/2010   PLANTAR FASCIITIS, BILATERAL 05/24/2009   Pre-diabetes    PTSD (post-traumatic stress disorder)    Vaginitis 05/06/2015   [facility]    hx of herpetic requiring I+D,( was bitten by autistic child that cares fr     Current Outpatient Medications:    diclofenac Sodium (VOLTAREN) 1 % GEL, APPLY 4 G TOPICALLY 4 TIMES DAILY, Disp: 200 g, Rfl: 2   divalproex (DEPAKOTE ER) 500 MG 24 hr tablet, Take 2 tablets (1,000 mg total)  by mouth daily., Disp: 60 tablet, Rfl: 3   fluticasone (FLONASE) 50 MCG/ACT nasal spray, Place 1 spray into both nostrils daily., Disp: 16 g, Rfl: 2   hydrOXYzine (ATARAX) 10 MG tablet, Take 2-3 tablets (20-30 mg total) by mouth daily as needed for anxiety., Disp: 70 tablet, Rfl: 3   meloxicam (MOBIC) 7.5 MG tablet, TAKE 1 TABLET BY MOUTH EVERY DAY, Disp: 30 tablet, Rfl: 0   methocarbamol (ROBAXIN) 500 MG tablet, Take 500 mg by mouth every 6 (six) hours as needed., Disp: , Rfl:    Multiple Vitamins-Minerals (ADULT ONE DAILY GUMMIES PO), Take 2 tablets by mouth daily., Disp: , Rfl:    pregabalin (LYRICA) 150 MG capsule, TAKE 1 CAPSULE BY MOUTH TWICE A DAY, Disp: 180 capsule, Rfl: 3  Review of Systems:    Cardiovascular: Patient endorses chest pain GU: Patient endorses vaginal discharge  Physical Exam:  Vitals:   03/18/23 1426  BP: 114/62  Pulse: 85  SpO2: 100%  Weight: 285 lb 4.8 oz (129.4 kg)  Height: 5\' 9"  (1.753 m)    General: Patient is sitting comfortably in the room  Cardio: Regular rate and rhythm, no murmurs, rubs or gallop Chest: No chest tenderness Pulmonary: Clear to ausculation bilaterally with no rales, rhonchi, and crackles  Assessment & Plan:   Vaginal discharge Patient reports a few day history of clear vaginal discharge.  She denies any vaginal itching or any foul odors.  She does report she is sexually active with her partner, and does not have any other sexual partners.  She states it feels like bacterial vaginosis that she has had in the past.  She denies any dysuria or hematuria.  She denies any burning sensation.  Will order self swab today.  Plan: -Follow-up self swab  Bipolar disorder [facility]) Patient has a past medical history of bipolar disorder.  She is on Depakote 1000 mg daily.  She reports that for the past few weeks she has been out of her Depakote, and has developed chest pains associated with palpitations, sweats, and shortness of breath.  She  states she has recently she has been having a lot of stressors.  She states these are episodic chest pains that do not worsen with exertion.  She describes as a sharp type pain.  She feels like these are panic attacks.  Of note, patient did start back her Depakote after running out of her Depakote.  On exam, patient does not have any chest tenderness.  Her heart and lung sounds clear and regular.  Did consider if this could be thyroid related, but less likely given not constant.  Less likely cardiac related given history is atypical.  This is episodic, so less likely related to a pneumonia or any infectious etiology.  Likely this is related to her stress and her having panic attacks.  GAD-7: 4.  PHQ 9:5  Plan: -Patient has therapy appointment tomorrow -Patient encouraged to use hydroxyzine as needed for anxiety attacks -Continue Depakote 1000 mg daily  Patient discussed with Dr. Mayford Knife  This patient was seen and evaluated by a medical student.  I have also evaluated and seen the patient on my own.  Together, Psychologist, occupational and I created a plan.  Modena Slater, DO PGY-2 Internal Medicine Resident  Pager: [phone]

## 2023-03-18 NOTE — Assessment & Plan Note (Addendum)
Patient reports a few day history of clear vaginal discharge.  She denies any vaginal itching or any foul odors.  She does report she is sexually active with her partner, and does not have any other sexual partners.  She states it feels like bacterial vaginosis that she has had in the past.  She denies any dysuria or hematuria.  She denies any burning sensation.  Will order self swab today.  Plan: -Follow-up self swab

## 2023-03-19 ENCOUNTER — Ambulatory Visit (INDEPENDENT_AMBULATORY_CARE_PROVIDER_SITE_OTHER): Payer: 59 | Admitting: Licensed Clinical Social Worker

## 2023-03-19 ENCOUNTER — Encounter (HOSPITAL_COMMUNITY): Payer: Self-pay | Admitting: Licensed Clinical Social Worker

## 2023-03-19 ENCOUNTER — Telehealth: Payer: Self-pay | Admitting: Student

## 2023-03-19 ENCOUNTER — Encounter: Payer: Self-pay | Admitting: Student

## 2023-03-19 DIAGNOSIS — F3131 Bipolar disorder, current episode depressed, mild: Secondary | ICD-10-CM

## 2023-03-19 DIAGNOSIS — M545 Low back pain, unspecified: Secondary | ICD-10-CM | POA: Diagnosis not present

## 2023-03-19 DIAGNOSIS — F419 Anxiety disorder, unspecified: Secondary | ICD-10-CM

## 2023-03-19 LAB — CERVICOVAGINAL ANCILLARY ONLY
Bacterial Vaginitis (gardnerella): POSITIVE — AB
Candida Glabrata: NEGATIVE
Candida Vaginitis: NEGATIVE
Chlamydia: NEGATIVE
Comment: NEGATIVE
Comment: NEGATIVE
Comment: NEGATIVE
Comment: NEGATIVE
Comment: NEGATIVE
Comment: NORMAL
Neisseria Gonorrhea: NEGATIVE
Trichomonas: NEGATIVE

## 2023-03-19 MED ORDER — METRONIDAZOLE 500 MG PO TABS: 500.0000 mg | ORAL_TABLET | Freq: Two times a day (BID) | ORAL | 0 refills | Status: AC

## 2023-03-19 NOTE — Progress Notes (Signed)
   THERAPIST PROGRESS NOTE  Session Time: 4:40-5:40pm  Participation Level: Active  Behavioral Response: Well GroomedAlertAnxious and Euthymic  Type of Therapy: Individual Therapy  Treatment Goals addressed:  Reduce frequency, intensity, and duration of depression symptoms so that daily functioning is improved   ProgressTowards Goals: Progressing  Interventions: Motivational Interviewing  Summary: Jordan [name] is a 43 y.o. female who presents with Bipolar I, depressed, mild and anxiety.   Suicidal/Homicidal: Nowithout intent/plan  Therapist Response: [name] engaged well in individual session with clinician.  Clinician utilized motivational interviewing to reflect and summarized thoughts and feelings.  Clinician explored updates with work and home.  [name] reports things at work are going really well and she is quite happy with her progress.  [name] shared concerns about niece and mother.  Clinician processed next steps in relationship with partner, noting that things seem to be a little bit better this week.  [name] shared that this has been a good week and she is prepared for anything to happen next week.  [name] reports that mood has stabilized now that she has her Depakote back in her system.  [name] reported that she has experienced 4 days of "mini panic attacks".  Clinician explored the context of these many panic attacks and provided psychoeducation about the causes.  Clinician reviewed coping skills and noted that use of coping skills has allowed [name] to move past panic within 5 to 7 minutes.  Clinician discussed lifestyle and encouraged improvement in eating nutritional foods, drinking plenty of water, and getting back into the gym on a regular basis.  Plan: Return again in 1 weeks.  Diagnosis: Bipolar 1 disorder, depressed, mild (HCC)  Anxiety  Collaboration of Care: Psychiatrist AEB notified Dr. Lolly Mustache about mini panic attacks  Patient/Guardian was advised Release of  Information must be obtained prior to any record release in order to collaborate their care with an outside provider. Patient/Guardian was advised if they have not already done so to contact the registration department to sign all necessary forms in order for [place] to release information regarding their care.   Consent: Patient/Guardian gives verbal consent for treatment and assignment of benefits for services provided during this visit. Patient/Guardian expressed understanding and agreed to proceed.   [name] [place], LCSW 03/19/2023

## 2023-03-19 NOTE — Telephone Encounter (Signed)
Patient tested positive for bacterial vaginitis.  Will send in metronidazole.  Patient contacted via MyChart.

## 2023-03-21 DIAGNOSIS — M545 Low back pain, unspecified: Secondary | ICD-10-CM | POA: Diagnosis not present

## 2023-03-21 NOTE — Progress Notes (Signed)
Internal Medicine Clinic Attending  Case discussed with the resident at the time of the visit.  We reviewed the resident's history and exam and pertinent patient test results.  I agree with the assessment, diagnosis, and plan of care documented in the resident's note.

## 2023-03-26 DIAGNOSIS — M545 Low back pain, unspecified: Secondary | ICD-10-CM | POA: Diagnosis not present

## 2023-03-27 ENCOUNTER — Encounter (HOSPITAL_COMMUNITY): Payer: Self-pay | Admitting: Licensed Clinical Social Worker

## 2023-03-27 ENCOUNTER — Ambulatory Visit (HOSPITAL_COMMUNITY): Payer: 59 | Admitting: Licensed Clinical Social Worker

## 2023-03-27 DIAGNOSIS — F3131 Bipolar disorder, current episode depressed, mild: Secondary | ICD-10-CM

## 2023-03-27 NOTE — Progress Notes (Signed)
 Virtual Visit via Video Note  I connected with ARHIANNA EBEY on 03/27/23 at  4:30 PM EST by a video enabled telemedicine application and verified that I am speaking with the correct person using two identifiers.  Location: Patient: home Provider: home office   I discussed the limitations of evaluation and management by telemedicine and the availability of in person appointments. The patient expressed understanding and agreed to proceed.   I discussed the assessment and treatment plan with the patient. The patient was provided an opportunity to ask questions and all were answered. The patient agreed with the plan and demonstrated an understanding of the instructions.   The patient was advised to call back or seek an in-person evaluation if the symptoms worsen or if the condition fails to improve as anticipated.  I provided 45 minutes of non-face-to-face time during this encounter.   Harlene JONELLE Rosser, LCSW   THERAPIST PROGRESS NOTE  Session Time: 4:30pm-5:15pm  Participation Level: Active  Behavioral Response: CasualAlertIrritable  Type of Therapy: Individual Therapy  Treatment Goals addressed: Reduce frequency, intensity, and duration of depression symptoms so that daily functioning is improved   ProgressTowards Goals: Progressing  Interventions: Motivational Interviewing  Summary: [name] SWETZ is a 43 y.o. female who presents with Bipolar I, depressed.   Suicidal/Homicidal: Nowithout intent/plan  Therapist Response: Cicily engaged well in individual virtual session with clinician.  Clinician utilized motivational interviewing OARS to process thoughts feelings and interactions.  Clinician provided time and space for Rhaelyn to share updates and challenges at home.  Clinician processed frustration and disappointment with her partner.  Asucena noted that there are often problems with her partner when good things are happening at work.  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  Clinician reflected the excitement and positivity in that area of her life.  Moe shared that this balances the frustration at home.  Clinician explored Shaunae's concerns about losing relationship with partner's daughter when partner moves out.  Delenn shared improvement in anxiety and noted no panic attacks this week.  Plan: Return again in 1 weeks.  Diagnosis: Bipolar 1 disorder, depressed, mild (HCC)  Collaboration of Care: Patient refused AEB none required  Patient/Guardian was advised Release of Information must be obtained prior to any record release in order to collaborate their care with an outside provider. Patient/Guardian was advised if they have not already done so to contact the registration department to sign all necessary forms in order for us  to release information regarding their care.   Consent: Patient/Guardian gives verbal consent for treatment and assignment of benefits for services provided during this visit. Patient/Guardian expressed understanding and agreed to proceed.   Harlene JONELLE [place], LCSW 03/27/2023

## 2023-03-28 DIAGNOSIS — M545 Low back pain, unspecified: Secondary | ICD-10-CM | POA: Diagnosis not present

## 2023-03-29 ENCOUNTER — Emergency Department (HOSPITAL_COMMUNITY): Payer: 59

## 2023-03-29 ENCOUNTER — Encounter (HOSPITAL_COMMUNITY): Payer: Self-pay | Admitting: Emergency Medicine

## 2023-03-29 ENCOUNTER — Emergency Department (HOSPITAL_COMMUNITY)
Admission: EM | Admit: 2023-03-29 | Discharge: 2023-03-30 | Discharge status: Against Medical Advice | Payer: 59 | Attending: Emergency Medicine | Admitting: Emergency Medicine

## 2023-03-29 DIAGNOSIS — Z5321 Procedure and treatment not carried out due to patient leaving prior to being seen by health care provider: Secondary | ICD-10-CM | POA: Insufficient documentation

## 2023-03-29 DIAGNOSIS — M7989 Other specified soft tissue disorders: Secondary | ICD-10-CM | POA: Diagnosis not present

## 2023-03-29 DIAGNOSIS — M25561 Pain in right knee: Secondary | ICD-10-CM | POA: Diagnosis not present

## 2023-03-29 DIAGNOSIS — W228XXA Striking against or struck by other objects, initial encounter: Secondary | ICD-10-CM | POA: Insufficient documentation

## 2023-03-29 MED ORDER — KETOROLAC TROMETHAMINE 30 MG/ML IJ SOLN
15.0000 mg | Freq: Once | INTRAMUSCULAR | Status: AC
  Administered 2023-03-29: 15 mg via INTRAMUSCULAR
  Filled 2023-03-29: qty 1

## 2023-03-29 NOTE — ED Triage Notes (Signed)
 Pt reports right knee pain. States she hit the knee on a metal step 2 days ago.

## 2023-03-29 NOTE — ED Provider Triage Note (Signed)
 Emergency Medicine Provider Triage Evaluation Note  Jordan [name] , a 43 y.o. female  was evaluated in triage.  Pt complains of right knee pain.  Review of Systems  Positive: Pain, swelling, injury Negative: Warmth, numbness, weakness,  Physical Exam  There were no vitals taken for this visit. Gen:   Awake, no distress   Resp:  Normal effort  MSK:   Moves extremities without difficulty  Other:  Mild edema to R knee, no erythema or warmth  Medical Decision Making  Medically screening exam initiated at 7:04 PM.  Appropriate orders placed.  Jordan [name] was informed that the remainder of the evaluation will be completed by another provider, this initial triage assessment does not replace that evaluation, and the importance of remaining in the ED until their evaluation is complete.  Imaging and meds ordered   Jordan [name] N, PA-C 03/29/23 [id number]

## 2023-03-30 NOTE — ED Notes (Signed)
Pt leaving d/t wait time.

## 2023-04-01 ENCOUNTER — Ambulatory Visit
Admission: RE | Admit: 2023-04-01 | Discharge: 2023-04-01 | Disposition: A | Discharge status: Home / Self Care | Payer: 59 | Source: Ambulatory Visit | Attending: Internal Medicine

## 2023-04-01 ENCOUNTER — Ambulatory Visit
Admission: RE | Admit: 2023-04-01 | Discharge: 2023-04-01 | Disposition: A | Discharge status: Home / Self Care | Payer: 59 | Source: Ambulatory Visit | Attending: Internal Medicine | Admitting: Internal Medicine

## 2023-04-01 DIAGNOSIS — N6315 Unspecified lump in the right breast, overlapping quadrants: Secondary | ICD-10-CM | POA: Diagnosis not present

## 2023-04-01 DIAGNOSIS — R928 Other abnormal and inconclusive findings on diagnostic imaging of breast: Secondary | ICD-10-CM

## 2023-04-01 DIAGNOSIS — M25561 Pain in right knee: Secondary | ICD-10-CM | POA: Diagnosis not present

## 2023-04-01 DIAGNOSIS — M1712 Unilateral primary osteoarthritis, left knee: Secondary | ICD-10-CM | POA: Diagnosis not present

## 2023-04-02 DIAGNOSIS — M545 Low back pain, unspecified: Secondary | ICD-10-CM | POA: Diagnosis not present

## 2023-04-03 ENCOUNTER — Ambulatory Visit (INDEPENDENT_AMBULATORY_CARE_PROVIDER_SITE_OTHER): Payer: 59 | Admitting: Licensed Clinical Social Worker

## 2023-04-03 DIAGNOSIS — F3131 Bipolar disorder, current episode depressed, mild: Secondary | ICD-10-CM

## 2023-04-04 DIAGNOSIS — M545 Low back pain, unspecified: Secondary | ICD-10-CM | POA: Diagnosis not present

## 2023-04-05 ENCOUNTER — Encounter (HOSPITAL_COMMUNITY): Payer: Self-pay | Admitting: Licensed Clinical Social Worker

## 2023-04-05 NOTE — Progress Notes (Signed)
Virtual Visit via Video Note  I connected with DELIAH [name] on 04/03/23 at  4:30 PM EST by a video enabled telemedicine application and verified that I am speaking with the correct person using two identifiers.  Location: Patient: Home Provider: parked car   I discussed the limitations of evaluation and management by telemedicine and the availability of in person appointments. The patient expressed understanding and agreed to proceed.    I discussed the assessment and treatment plan with the patient. The patient was provided an opportunity to ask questions and all were answered. The patient agreed with the plan and demonstrated an understanding of the instructions.   The patient was advised to call back or seek an in-person evaluation if the symptoms worsen or if the condition fails to improve as anticipated.  I provided 45 minutes of non-face-to-face time during this encounter.   [name], LCSW   THERAPIST PROGRESS NOTE  Session Time: 4:30pm-5:15pm  Participation Level: Active  Behavioral Response: Well GroomedAlertDepressed and Irritable  Type of Therapy: Individual Therapy  Treatment Goals addressed: Reduce frequency, intensity, and duration of depression symptoms so that daily functioning is improved   ProgressTowards Goals: Progressing  Interventions: CBT  Summary: Jordan [name] is a 43 y.o. female who presents with Bipolar I disorder, depressed, mild.   Suicidal/Homicidal: Nowithout intent/plan  Therapist Response: Jordan [name] engaged well in individual virtual session with clinician.  Clinician utilized CBT to process thoughts feelings and interactions.  Clinician discussed relationship and current status.  Clinician identified importance of coping skills when dealing with frustrations at home.  Clinician discussed ways to communicate with partner in order to be clearly understood.  Jordan [name] shared her feelings and disappointment that this relationship was not  as good as she hoped it would be.  Plan: Return again in 1 weeks.  Diagnosis: Bipolar 1 disorder, depressed, mild (HCC)  Collaboration of Care: Patient refused AEB none required  Patient/Guardian was advised Release of Information must be obtained prior to any record release in order to collaborate their care with an outside provider. Patient/Guardian was advised if they have not already done so to contact the registration department to sign all necessary forms in order for [place] to release information regarding their care.   Consent: Patient/Guardian gives verbal consent for treatment and assignment of benefits for services provided during this visit. Patient/Guardian expressed understanding and agreed to proceed.   [name] [place], LCSW 04/05/2023

## 2023-04-08 DIAGNOSIS — Z1231 Encounter for screening mammogram for malignant neoplasm of breast: Secondary | ICD-10-CM

## 2023-04-09 DIAGNOSIS — M545 Low back pain, unspecified: Secondary | ICD-10-CM | POA: Diagnosis not present

## 2023-04-10 ENCOUNTER — Ambulatory Visit (HOSPITAL_COMMUNITY): Payer: 59 | Admitting: Licensed Clinical Social Worker

## 2023-04-11 ENCOUNTER — Telehealth: Payer: Self-pay | Admitting: Plastic Surgery

## 2023-04-11 ENCOUNTER — Ambulatory Visit: Payer: 59 | Admitting: Physician Assistant

## 2023-04-11 DIAGNOSIS — M545 Low back pain, unspecified: Secondary | ICD-10-CM | POA: Diagnosis not present

## 2023-04-11 NOTE — Telephone Encounter (Signed)
put with Gerre Pebbles until we hear from the pt, called 04-11-23 lvmail called and asked if 230 will work or we can move to diff day

## 2023-04-11 NOTE — Telephone Encounter (Signed)
called 04-11-23 lvmail called and asked to r/s to pa and diff time but pt called right back and moved to a different day

## 2023-04-15 ENCOUNTER — Ambulatory Visit (INDEPENDENT_AMBULATORY_CARE_PROVIDER_SITE_OTHER): Payer: 59 | Admitting: Student

## 2023-04-15 ENCOUNTER — Ambulatory Visit (INDEPENDENT_AMBULATORY_CARE_PROVIDER_SITE_OTHER): Payer: 59 | Admitting: Physician Assistant

## 2023-04-15 VITALS — BP 110/74 | HR 59 | Temp 97.6°F | Ht 69.0 in | Wt 280.4 lb

## 2023-04-15 VITALS — BP 109/71 | HR 58 | Ht 69.0 in | Wt 278.0 lb

## 2023-04-15 DIAGNOSIS — N62 Hypertrophy of breast: Secondary | ICD-10-CM

## 2023-04-15 DIAGNOSIS — M25561 Pain in right knee: Secondary | ICD-10-CM

## 2023-04-15 NOTE — Progress Notes (Signed)
 CC: Orthopedic referral for knee pain  HPI:  [name] is a 43 y.o. female living with a history stated below and presents today for orthopedic referral for knee pain. Please see problem based assessment and plan for additional details.  Past Medical History:  Diagnosis Date   Alcohol abuse    Allergic rhinitis    pt takes flonase for episodes, no episodes in 2012   Anemia    Anxiety    Basal cell carcinoma    recurrent in left maxillary, has had 5 surguries, last one 2003   Basal cell nevus syndrome    dx age 6   Bipolar 1 disorder (HCC)    BPPV (benign paroxysmal positional vertigo) 03/01/2016   Candidiasis, vagina    recurrent, pt has made lifestyle modifications and none recently (as of 8/12)   Cervicitis 03/19/2019   Cyst of nasal sinus    Depression    B-Polar   Fatigue 08/11/2020   Fibroma    left ovarian   Fibromyalgia    Gardnerella infection    + in 08/08   GERD (gastroesophageal reflux disease)    occ   Hematoma, subungual, finger, right, initial encounter 01/23/2021   Herniated disc    Hyperlipidemia    Joint pain    Lactose intolerance    Obesity    Ovarian cyst 08/23/2020   OVARIAN CYSTECTOMY, HX OF 03/01/2006   ovarian fibroma-left 1996, L ovary and fallopian tube removed     Paronychia of third finger of left hand 07/28/2010   PLANTAR FASCIITIS, BILATERAL 05/24/2009   Pre-diabetes    PTSD (post-traumatic stress disorder)    Vaginitis 05/06/2015   [facility]    hx of herpetic requiring I+D,( was bitten by autistic child that cares fr    Current Outpatient Medications on File Prior to Visit  Medication Sig Dispense Refill   diclofenac Sodium (VOLTAREN) 1 % GEL APPLY 4 G TOPICALLY 4 TIMES DAILY 200 g 2   divalproex (DEPAKOTE ER) 500 MG 24 hr tablet Take 2 tablets (1,000 mg total) by mouth daily. 60 tablet 3   fluticasone (FLONASE) 50 MCG/ACT nasal spray Place 1 spray into both nostrils daily. 16 g 2   hydrOXYzine (ATARAX) 10 MG tablet  Take 2-3 tablets (20-30 mg total) by mouth daily as needed for anxiety. 70 tablet 3   meloxicam (MOBIC) 7.5 MG tablet TAKE 1 TABLET BY MOUTH EVERY DAY 30 tablet 0   methocarbamol (ROBAXIN) 500 MG tablet Take 500 mg by mouth every 6 (six) hours as needed.     Multiple Vitamins-Minerals (ADULT ONE DAILY GUMMIES PO) Take 2 tablets by mouth daily.     pregabalin (LYRICA) 150 MG capsule TAKE 1 CAPSULE BY MOUTH TWICE A DAY 180 capsule 3   No current facility-administered medications on file prior to visit.    Family History  Problem Relation Age of Onset   Bipolar disorder Mother    Alcohol abuse Mother    Drug abuse Mother    Other Mother        DDD   Hypertension Mother    Hyperlipidemia Mother    Depression Mother    Anxiety disorder Mother    Alcoholism Mother    Colon polyps Mother    Bipolar disorder Father    Other Father        basal cell nevus syndrome   Depression Father    Drug abuse Sister    Colon polyps Sister    Drug  abuse Brother    Other Brother        basal cell syndrome   Breast cancer Maternal Aunt    Breast cancer Maternal Aunt    Diabetes Maternal Grandmother    Heart attack Maternal Grandmother    Cancer Maternal Grandmother        stomach   Stomach cancer Other        uncle   Cancer Other        unknown grandfather   Diabetes Other        grandmother, aunt and 2 uncles   Colon cancer Other        cousin at age 63    Social History   Socioeconomic History   Marital status: Single    Spouse name: Not on file   Number of children: 0   Years of education: 13   Highest education level: Some college, no degree  Occupational History   Occupation: disabled    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 There are 4 steps up to the entrance the patient uses. There is a railing.      Patient's method of transportation is personal car.      The highest level of education was some college.      The patient currently disabled but working part time       Identified important Relationships are "2 nieces and my best friend"       Pets : 1 dog, yorkie named Photographer / Fun: "Umm I don't know I am trying to find some things"      Current Stressors: "finances and trying to keep this weight off"       Religious / Personal Beliefs: "Christianity"       Social Drivers of Health   Financial Resource Strain: Low Risk  (08/01/2022)   Overall Financial Resource Strain (CARDIA)    Difficulty of Paying Living Expenses: Not hard at all  Food Insecurity: No Food Insecurity (08/01/2022)   Hunger Vital Sign    Worried About Running Out of Food in the Last Year: Never true    Ran Out of Food in the Last Year: Never true  Transportation Needs: No Transportation Needs (08/01/2022)   PRAPARE - Administrator, Civil Service (Medical): No    Lack of Transportation (Non-Medical): No  Physical Activity: Insufficiently Active (08/01/2022)   Exercise Vital Sign    Days of Exercise per Week: 3 days    Minutes of Exercise per Session: 30 min  Stress: No Stress Concern Present (08/01/2022)   [employer] of Occupational Health - Occupational Stress Questionnaire    Feeling of Stress : Not at all  Social Connections: Moderately Integrated (12/27/2021)   Social Connection and Isolation Panel [NHANES]    Frequency of Communication with Friends and Family: Twice a week  Frequency of Social Gatherings with Friends and Family: Once a week    Attends Religious  Services: More than 4 times per year    Active Member of [employer] or Organizations: Yes    Attends Engineer, structural: More than 4 times per year    Marital Status: Never married  Intimate Partner Violence: Not At Risk (12/27/2021)   Humiliation, Afraid, Rape, and Kick questionnaire    Fear of Current or Ex-Partner: No    Emotionally Abused: No    Physically Abused: No    Sexually Abused: No    Review of Systems: ROS negative except for what is noted on the assessment and plan.  Vitals:   04/15/23 0936  BP: 110/74  Pulse: (!) 59  Temp: 97.6 F (36.4 C)  TempSrc: Oral  SpO2: 100%  Weight: 280 lb 6.4 oz (127.2 kg)  Height: 5\' 9"  (1.753 m)    Physical Exam: Constitutional: obese, sitting in chair, in no acute distress Cardiovascular: regular rate and rhythm, no m/r/g Pulmonary/Chest: normal work of breathing on room air, lungs clear to auscultation bilaterally Abdominal: soft, non-tender, non-distended MSK: right knee with medial joint line tenderness Skin: warm and dry Psych: normal mood and behavior  Assessment & Plan:     Patient discussed with Dr.  Deirdre Priest  Right knee pain Chronic. Has been seen in our office for this. Physical exam findings consistent with medial meniscus pathology with positive McMurray test. X-ray's without any acute pathology and no joint space narrowing. Has been seeing [facility] who prescribed knee brace but she states this was not covered by insurance because it was not prescribed by PCP. Apparently, patient never got formal referral to [facility] which is why it was not covered. Re-sent in referral and she has appointment already set up for 3/3.  Addendum: Knee Xray without any acute pathology and no joint space narrowing.    Carmina Miller, D.O. [facility] Internal Medicine, PGY-1 Phone: [phone] Date 04/15/2023 Time 1:13 PM

## 2023-04-15 NOTE — Progress Notes (Signed)
   Referring Provider Reymundo Poll, MD [address] [place],  [place] [address]   CC:  Chief Complaint  Patient presents with   Follow-up      ANJELIKA AUSBURN is an 43 y.o. female.  HPI: Patient is a 43 y.o. year old female here for follow up after completing mammogram for consideration of breast reduction surgery.  She was seen for initial consult by Dr. Ladona Ridgel 09/2022.  At that time, complained of chronic upper back and neck discomfort in the context of large breasts.  She also was undergoing evaluation of palpable mass on right breast felt to be fibroadenoma.  Plan for repeat imaging 03/2023.  STN 37 cm on the right and 34 cm on the left.  Estimated tissue to be removed at time of surgery close 1000 g in the right and 800 g in the left.  Follow-up after imaging in 2025.  Personally reviewed imaging obtained 04/01/2023.  Stable, benign-appearing mass at 3:00 region right breast.  Follow-up ultrasound also suggests benign, BI-RADS Category 2.  Recommending repeat screening mammogram in 1 year.  Today, patient states that she is doing well.  She had been focusing on her weight loss after consult with Dr. Ladona Ridgel and lost approximately 20 pounds.  However, she subsequently injured her right knee and has since needed to follow-up with orthopedics and physical therapy.  She has gained the weight back, but is eager and motivated to lose it again.  She understands that the minimum BMI requirements is less than 40 kg/m and she has a little ways to go.  She is completely understanding of the reasoning for abstaining until weight loss is achieved so as to mitigate risk of postoperative complications and optimize cosmesis.  Review of Systems General: Denies fevers MSK: Endorses ongoing back and neck discomfort  Physical Exam    04/15/2023   10:58 AM 04/15/2023    9:36 AM 03/29/2023    7:15 PM  Vitals with BMI  Height 5\' 9"  5\' 9"    Weight 278 lbs 280 lbs 6 oz   BMI 41.03 41.39   Systolic 109 110  121  Diastolic 71 74 77  Pulse 58 59 64    General:  No acute distress,  Alert and oriented, Non-Toxic, Normal speech and affect Psych: Normal behavior and mood Respiratory: No increased WOB MSK: Ambulatory  Assessment/Plan  Macromastia  Patient is interested in pursuing surgical intervention for bilateral breast reduction.  Her imaging is reassuring, but now she will focus on continued weight loss.  Follow-up in 3 to 4 months for weight check.  If at that time she is closer to 250 pounds and BMI is less than 40, can likely proceed with submitting for insurance authorization for breast reduction surgery.  Patient already reports having a plan in place and is motivated.  Evelena Leyden 04/15/2023, 11:38 AM

## 2023-04-15 NOTE — Patient Instructions (Signed)
 I have sent in the referral for [facility].  Follow-up in 3 months

## 2023-04-15 NOTE — Assessment & Plan Note (Addendum)
 Chronic. Has been seen in our office for this. Physical exam findings consistent with medial meniscus pathology with positive McMurray test. X-ray's without any acute pathology and no joint space narrowing. Has been seeing [facility] who prescribed knee brace but she states this was not covered by insurance because it was not prescribed by PCP. Apparently, patient never got formal referral to [facility] which is why it was not covered. Re-sent in referral and she has appointment already set up for 3/3.  Addendum: Knee Xray without any acute pathology and no joint space narrowing.

## 2023-04-16 ENCOUNTER — Ambulatory Visit (INDEPENDENT_AMBULATORY_CARE_PROVIDER_SITE_OTHER): Payer: 59 | Admitting: Licensed Clinical Social Worker

## 2023-04-16 ENCOUNTER — Other Ambulatory Visit: Payer: Self-pay | Admitting: Internal Medicine

## 2023-04-16 DIAGNOSIS — F3131 Bipolar disorder, current episode depressed, mild: Secondary | ICD-10-CM

## 2023-04-16 DIAGNOSIS — M25561 Pain in right knee: Secondary | ICD-10-CM

## 2023-04-16 DIAGNOSIS — M545 Low back pain, unspecified: Secondary | ICD-10-CM | POA: Diagnosis not present

## 2023-04-16 NOTE — Progress Notes (Signed)
 Internal Medicine Clinic Attending  Case discussed with the resident at the time of the visit.  We reviewed the resident's history and exam and pertinent patient test results.  I agree with the assessment, diagnosis, and plan of care documented in the resident's note.

## 2023-04-17 ENCOUNTER — Encounter (HOSPITAL_COMMUNITY): Payer: Self-pay | Admitting: Licensed Clinical Social Worker

## 2023-04-17 NOTE — Progress Notes (Signed)
   THERAPIST PROGRESS NOTE  Session Time: 2:40pm-3:30pm  Participation Level: Active  Behavioral Response: Well GroomedAlertIrritable  Type of Therapy: Individual Therapy  Treatment Goals addressed: Reduce frequency, intensity, and duration of depression symptoms so that daily functioning is improved   ProgressTowards Goals: Progressing  Interventions: CBT  Summary: Jordan [name] is a 43 y.o. female who presents with Bipolar I, depressed, mild.   Suicidal/Homicidal: Nowithout intent/plan  Therapist Response: [name] engaged well in individual session with clinician in office.  Clinician utilized CBT to process thoughts feelings and interactions.  Clinician discussed updates with work, family, and partner.  [name] provided updates about successes and stresses at work.  Clinician identified some low self-esteem about her abilities to complete tasks without supervision.  Clinician identified the importance of positive self-talk and in her abilities.  Clinician utilized the best case scenario/worst case scenario intervention about making mistakes at work.  Clinician identified the unhelpful nature of perfectionism.  [name] shared concerns about nieces, noting plans for them to move.  Clinician challenged [name]'s responsibility and finding them a new place to live.  Clinician noted that [name] tends to take on responsibility for others who can be responsible for themselves.  Plan: Return again in 2 weeks.  Diagnosis: Bipolar 1 disorder, depressed, mild (HCC)  Collaboration of Care: Patient refused AEB none required  Patient/Guardian was advised Release of Information must be obtained prior to any record release in order to collaborate their care with an outside provider. Patient/Guardian was advised if they have not already done so to contact the registration department to sign all necessary forms in order for [place] to release information regarding their care.   Consent: Patient/Guardian  gives verbal consent for treatment and assignment of benefits for services provided during this visit. Patient/Guardian expressed understanding and agreed to proceed.   [name], LCSW 04/17/2023

## 2023-04-18 DIAGNOSIS — M545 Low back pain, unspecified: Secondary | ICD-10-CM | POA: Diagnosis not present

## 2023-04-23 DIAGNOSIS — M545 Low back pain, unspecified: Secondary | ICD-10-CM | POA: Diagnosis not present

## 2023-04-24 ENCOUNTER — Ambulatory Visit (INDEPENDENT_AMBULATORY_CARE_PROVIDER_SITE_OTHER): Payer: 59 | Admitting: Licensed Clinical Social Worker

## 2023-04-24 ENCOUNTER — Ambulatory Visit: Payer: 59 | Admitting: Obstetrics and Gynecology

## 2023-04-24 DIAGNOSIS — F3131 Bipolar disorder, current episode depressed, mild: Secondary | ICD-10-CM

## 2023-04-25 DIAGNOSIS — M545 Low back pain, unspecified: Secondary | ICD-10-CM | POA: Diagnosis not present

## 2023-04-29 DIAGNOSIS — M25561 Pain in right knee: Secondary | ICD-10-CM | POA: Diagnosis not present

## 2023-04-30 ENCOUNTER — Encounter (HOSPITAL_COMMUNITY): Payer: Self-pay | Admitting: Licensed Clinical Social Worker

## 2023-04-30 ENCOUNTER — Ambulatory Visit (INDEPENDENT_AMBULATORY_CARE_PROVIDER_SITE_OTHER): Payer: 59 | Admitting: Licensed Clinical Social Worker

## 2023-04-30 DIAGNOSIS — F3131 Bipolar disorder, current episode depressed, mild: Secondary | ICD-10-CM

## 2023-04-30 NOTE — Progress Notes (Signed)
 Virtual Visit via Video Note  I connected with Jordan [name] on 04/24/23 at  4:30 PM EST by a video enabled telemedicine application and verified that I am speaking with the correct person using two identifiers.  Location: Patient: home Provider: home office   I discussed the limitations of evaluation and management by telemedicine and the availability of in person appointments. The patient expressed understanding and agreed to proceed.   I discussed the assessment and treatment plan with the patient. The patient was provided an opportunity to ask questions and all were answered. The patient agreed with the plan and demonstrated an understanding of the instructions.   The patient was advised to call back or seek an in-person evaluation if the symptoms worsen or if the condition fails to improve as anticipated.  I provided 45 minutes of non-face-to-face time during this encounter.   Jordan Melter, LCSW   THERAPIST PROGRESS NOTE  Session Time: 4:30pm-5:15pm  Participation Level: Active  Behavioral Response: CasualAlertIrritable  Type of Therapy: Individual Therapy  Treatment Goals addressed:  Reduce frequency, intensity, and duration of depression symptoms so that daily functioning is improved   ProgressTowards Goals: Progressing  Interventions: CBT  Summary: Jordan [name] is a 43 y.o. female who presents with Bipolar I, depressed, mild.   Suicidal/Homicidal: Nowithout intent/plan  Therapist Response: Jordan [name] engaged well in individual virtual session with clinician. Clinician utilized CBT to process thoughts, feelings, and interactions. Clinician explored interactions, mood, and relationships with friends and family. Jordan [name] shared updates at work and noted that there have been changes and they are not great. Clinician identified the importance of focusing on what she can do and to avoid drama. Clinician also processed communication skills and ways to handle a  micro-managing boss. Clinician identified coping skills, including walking away and deep breathing to manage stress and frustration, as well as having a good outlet. 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   Plan: Return again in 2 weeks.  Diagnosis: Bipolar 1 disorder, depressed, mild (HCC)  Collaboration of Care: Patient refused AEB none required  Patient/Guardian was advised Release of Information must be obtained prior to any record release in order to collaborate their care with an outside provider. Patient/Guardian was advised if they have not already done so to contact the registration department to sign all necessary forms in order for [place] to release information regarding their care.   Consent: Patient/Guardian gives verbal consent for treatment and assignment of benefits for services provided during this visit. Patient/Guardian expressed understanding and agreed to proceed.   Jordan [name] [place], LCSW 04/30/2023

## 2023-05-02 DIAGNOSIS — M545 Low back pain, unspecified: Secondary | ICD-10-CM | POA: Diagnosis not present

## 2023-05-04 DIAGNOSIS — M25561 Pain in right knee: Secondary | ICD-10-CM | POA: Diagnosis not present

## 2023-05-07 ENCOUNTER — Encounter (HOSPITAL_COMMUNITY): Payer: Self-pay | Admitting: Licensed Clinical Social Worker

## 2023-05-07 DIAGNOSIS — M545 Low back pain, unspecified: Secondary | ICD-10-CM | POA: Diagnosis not present

## 2023-05-07 NOTE — Progress Notes (Signed)
   THERAPIST PROGRESS NOTE  Session Time: 4 30-5 15  Participation Level: Active  Behavioral Response: NeatAlerttired  Type of Therapy: Individual Therapy  Treatment Goals addressed: Reduce frequency, intensity, and duration of depression symptoms so that daily functioning is improved   ProgressTowards Goals: Progressing  Interventions: Motivational Interviewing  Summary: Jordan [name] is a 43 y.o. female who presents with Bipolar I, depressed,mild.   Suicidal/Homicidal: Nowithout intent/plan  Therapist Response: [name] engaged well in individual session with clinician.  Clinician utilized motivational interviewing to process thoughts feelings and interactions.  Clinician reflected and summarized excitement and joy in her job.  Clinician identified that [name] is doing very well in her training and has been given more responsibility due to her competence.  Clinician explored social life and family relationships.  [name] shared ongoing difficulty in relationship with partner.  Clinician identified no movements in either direction in the relationship.  [name] shared readiness for the relationship to change.  Clinician explored coping skills, pain management, and supports.  Plan: Return again in 2 weeks.  Diagnosis: Bipolar 1 disorder, depressed, mild (HCC)  Collaboration of Care: Patient refused AEB none required  Patient/Guardian was advised Release of Information must be obtained prior to any record release in order to collaborate their care with an outside provider. Patient/Guardian was advised if they have not already done so to contact the registration department to sign all necessary forms in order for [place] to release information regarding their care.   Consent: Patient/Guardian gives verbal consent for treatment and assignment of benefits for services provided during this visit. Patient/Guardian expressed understanding and agreed to proceed.   [name] [place],  LCSW 05/07/2023

## 2023-05-09 ENCOUNTER — Ambulatory Visit (INDEPENDENT_AMBULATORY_CARE_PROVIDER_SITE_OTHER): Payer: 59 | Admitting: Licensed Clinical Social Worker

## 2023-05-09 DIAGNOSIS — F3131 Bipolar disorder, current episode depressed, mild: Secondary | ICD-10-CM

## 2023-05-10 DIAGNOSIS — M25561 Pain in right knee: Secondary | ICD-10-CM | POA: Diagnosis not present

## 2023-05-11 ENCOUNTER — Encounter (HOSPITAL_COMMUNITY): Payer: Self-pay | Admitting: Licensed Clinical Social Worker

## 2023-05-11 NOTE — Progress Notes (Signed)
 Virtual Visit via Video Note  I connected with Jordan [name] on 05/09/23 at  4:30 PM EDT by a video enabled telemedicine application and verified that I am speaking with the correct person using two identifiers.  Location: Patient: home Provider: home office   I discussed the limitations of evaluation and management by telemedicine and the availability of in person appointments. The patient expressed understanding and agreed to proceed.   I discussed the assessment and treatment plan with the patient. The patient was provided an opportunity to ask questions and all were answered. The patient agreed with the plan and demonstrated an understanding of the instructions.   The patient was advised to call back or seek an in-person evaluation if the symptoms worsen or if the condition fails to improve as anticipated.  I provided 55 minutes of non-face-to-face time during this encounter.   [name], LCSW   THERAPIST PROGRESS NOTE  Session Time: 4:30pm-5:25pm  Participation Level: Active  Behavioral Response: CasualAlertDepressed  Type of Therapy: Individual Therapy  Treatment Goals addressed: Reduce frequency, intensity, and duration of depression symptoms so that daily functioning is improved   ProgressTowards Goals: Progressing  Interventions: CBT  Summary: Jordan [name] is a 43 y.o. female who presents with Bipolar I, depressed, mild.   Suicidal/Homicidal: Nowithout intent/plan  Therapist Response: Jordan [name] engaged well in individual virtual session with clinician. Clinician utilized CBT to process thoughts, feelings, interactions with friends and family. Clinician provided time and space for processing updates and frustrations in relationship. Jordan [name] noted other frustrations at work and school. Clinician engaged Jordan [name] in problem solving. Clinician discussed ways to use mindful communication in order make sure she is well understood.   Plan: Return again in 1-2  weeks.  Diagnosis: Bipolar 1 disorder, depressed, mild (HCC)  Collaboration of Care: Patient refused AEB none required  Patient/Guardian was advised Release of Information must be obtained prior to any record release in order to collaborate their care with an outside provider. Patient/Guardian was advised if they have not already done so to contact the registration department to sign all necessary forms in order for [place] to release information regarding their care.   Consent: Patient/Guardian gives verbal consent for treatment and assignment of benefits for services provided during this visit. Patient/Guardian expressed understanding and agreed to proceed.   [name] [place], LCSW 05/11/2023

## 2023-05-14 ENCOUNTER — Ambulatory Visit (INDEPENDENT_AMBULATORY_CARE_PROVIDER_SITE_OTHER): Payer: 59 | Admitting: Licensed Clinical Social Worker

## 2023-05-14 DIAGNOSIS — F3131 Bipolar disorder, current episode depressed, mild: Secondary | ICD-10-CM | POA: Diagnosis not present

## 2023-05-15 ENCOUNTER — Encounter (HOSPITAL_COMMUNITY): Payer: Self-pay | Admitting: Licensed Clinical Social Worker

## 2023-05-15 NOTE — Progress Notes (Signed)
   THERAPIST PROGRESS NOTE  Session Time: 4:30pm-5:20pm  Participation Level: Active  Behavioral Response: NeatAlertDepressed and tired  Type of Therapy: Individual Therapy  Treatment Goals addressed: Reduce frequency, intensity, and duration of depression symptoms so that daily functioning is improved   ProgressTowards Goals: Progressing  Interventions: CBT  Summary: Jordan [name] is a 43 y.o. female who presents with Bipolar I, depressed, mild.   Suicidal/Homicidal: Nowithout intent/plan  Therapist Response: [name] engaged well in individual and person session with clinician.  Clinician utilized CBT to process thoughts feelings and interactions.  [name] shared updates and frustrations at work, particularly due to Academic librarian.  Clinician explored coping skills and identified ways to maintain and manage her mood with new team lead.  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  Clinician explored updates with family and noted ongoing challenges with mother, sister, niece.  [name] also shared updates with partner, who now plans to move next door.  Clinician explored Sole's willingness to stay in the relationship if partner lives next door and not in her home [name] shared at this point she is ready to be on her own again.  Plan: Return again in 1 weeks.  Diagnosis: Bipolar 1 disorder, depressed, mild (HCC)  Collaboration of Care: Patient refused AEB none required  Patient/Guardian was advised Release of Information must be obtained prior to any record release in order to collaborate their care with an outside provider. Patient/Guardian was advised if they have not already done so to contact the registration department to sign all necessary forms in order for [place] to release information regarding their care.   Consent: Patient/Guardian gives verbal consent for treatment and  assignment of benefits for services provided during this visit. Patient/Guardian expressed understanding and agreed to proceed.   [name], LCSW 05/15/2023

## 2023-05-16 DIAGNOSIS — M545 Low back pain, unspecified: Secondary | ICD-10-CM | POA: Diagnosis not present

## 2023-05-22 ENCOUNTER — Ambulatory Visit: Payer: Self-pay | Admitting: *Deleted

## 2023-05-22 ENCOUNTER — Ambulatory Visit (HOSPITAL_COMMUNITY): Admitting: Licensed Clinical Social Worker

## 2023-05-22 NOTE — Telephone Encounter (Signed)
  Chief Complaint: pain top of left foot returned, fracture in left foot few months ago  Symptoms: pain top of left foot. Hx fracture left foot few months ago and pain gone now returned. Limping. Pain level at rest 3/10 with mobility 5/10.  Frequency: today  Pertinent Negatives: Patient denies fever no swelling no redness Disposition: [] ED /[x] Urgent Care (no appt availability in office) / [] Appointment(In office/virtual)/ []  [facility]/ [] Home Care/ [] Refused Recommended Disposition /[] [facility]/ []  Follow-up with PCP Additional Notes:   No available appt with in 3 days with PCP or other providers. Attempted to contact [facility] to assist with appt. And no answer. Recommended [facility] or [facility] . Patient reports she will call Dr. Charlsie Merles foot dr to see if she can get appt . Please advise and patient would like call back for appt.      Copied from CRM [id number]. Topic: Clinical - Red Word Triage >> May 22, 2023  3:52 PM Corin V wrote: [facility] that prompted transfer to Nurse Triage: Patient fractured left foot a few months ago and it has suddenly started to hurt again. Reason for Disposition  [1] MODERATE pain (e.g., interferes with normal activities, limping) AND [2] present > 3 days  Answer Assessment - Initial Assessment Questions 1. ONSET: "When did the pain start?"      Today  2. LOCATION: "Where is the pain located?"      Top of left foot  3. PAIN: "How bad is the pain?"    (Scale 1-10; or mild, moderate, severe)  - MILD (1-3): doesn't interfere with normal activities.   - MODERATE (4-7): interferes with normal activities (e.g., work or school) or awakens from sleep, limping.   - SEVERE (8-10): excruciating pain, unable to do any normal activities, unable to walk.      At rest 3/10 with mobility 5/10 4. WORK OR EXERCISE: "Has there been any recent work or exercise that involved this part of the body?"      Na  5. CAUSE: "What do you think is causing the foot  pain?"     Not sure s/p fracture  6. OTHER SYMPTOMS: "Do you have any other symptoms?" (e.g., leg pain, rash, fever, numbness)     Top of left foot pain. Limping to walk  7. PREGNANCY: "Is there any chance you are pregnant?" "When was your last menstrual period?"     na  Protocols used: Foot Pain-A-AH

## 2023-05-23 ENCOUNTER — Ambulatory Visit (INDEPENDENT_AMBULATORY_CARE_PROVIDER_SITE_OTHER)

## 2023-05-23 ENCOUNTER — Encounter (HOSPITAL_COMMUNITY): Payer: Self-pay | Admitting: Emergency Medicine

## 2023-05-23 ENCOUNTER — Other Ambulatory Visit: Payer: Self-pay

## 2023-05-23 ENCOUNTER — Ambulatory Visit (HOSPITAL_COMMUNITY)
Admission: EM | Admit: 2023-05-23 | Discharge: 2023-05-23 | Disposition: A | Discharge status: Home / Self Care | Attending: Family Medicine | Admitting: Family Medicine

## 2023-05-23 DIAGNOSIS — M79672 Pain in left foot: Secondary | ICD-10-CM

## 2023-05-23 DIAGNOSIS — M19072 Primary osteoarthritis, left ankle and foot: Secondary | ICD-10-CM | POA: Diagnosis not present

## 2023-05-23 NOTE — ED Triage Notes (Addendum)
 Reports injuring left foot a few months ago.  Pain had resolved after seeing foot specialist. Yesterday, pain suddenly reoccurred .  Pain is across top of foot and is described as being just like last time she was here for foot injury.  Patient reports she has been using ice and elevation.  Patient has been taking tylenol.    Patient reports she recently returned to activities at work requiring a lot of heavy lifting  Patient is currently being seen for right knee issues.

## 2023-05-23 NOTE — ED Provider Notes (Signed)
 [facility]    CSN: [id number] Arrival date & time: 05/23/23  1106      History   Chief Complaint Chief Complaint  Patient presents with   Foot Pain    HPI Jordan [name] is a 43 y.o. female.   Jordan [name] presents with 1 day of acute left foot and ankle pain.  She was seen by podiatrist previously in November and there was concern for a possible stress fracture.  Initially it was recommended that she wear a postop shoe or boot for protection but the patient declined at that point.  Since then her pain resolved and she is continue working with heavy [employer].  Yesterday when carrying a heavy wood load she had acute recurrence of the pain with difficulty bearing weight and walking.  There is not been any swelling, erythema, numbness, but there has been some weakness with movement of the ankle.  She has been taking naproxen twice daily for her right knee which is being treated at [facility] orthopedics.  Despite this she still has ongoing pain.  The history is provided by the patient.  Foot Pain    Past Medical History:  Diagnosis Date   Alcohol abuse    Allergic rhinitis    pt takes flonase for episodes, no episodes in 2012   Anemia    Anxiety    Basal cell carcinoma    recurrent in left maxillary, has had 5 surguries, last one 2003   Basal cell nevus syndrome    dx age 19   Bipolar 1 disorder (HCC)    BPPV (benign paroxysmal positional vertigo) 03/01/2016   Candidiasis, vagina    recurrent, pt has made lifestyle modifications and none recently (as of 8/12)   Cervicitis 03/19/2019   Cyst of nasal sinus    Depression    B-Polar   Fatigue 08/11/2020   Fibroma    left ovarian   Fibromyalgia    Gardnerella infection    + in 08/08   GERD (gastroesophageal reflux disease)    occ   Hematoma, subungual, finger, right, initial encounter 01/23/2021   Herniated disc    Hyperlipidemia    Joint pain    Lactose intolerance    Obesity    Ovarian cyst  08/23/2020   OVARIAN CYSTECTOMY, HX OF 03/01/2006   ovarian fibroma-left 1996, L ovary and fallopian tube removed     Paronychia of third finger of left hand 07/28/2010   PLANTAR FASCIITIS, BILATERAL 05/24/2009   Pre-diabetes    PTSD (post-traumatic stress disorder)    Vaginitis 05/06/2015   [place]    hx of herpetic requiring I+D,( was bitten by autistic child that cares fr    Patient Active Problem List   Diagnosis Date Noted   Breast mass, right 03/14/2022   Reactive airway disease 11/29/2021   Seasonal allergic conjunctivitis 05/31/2021   Venous insufficiency 05/31/2021   Preventative health care 10/03/2020   Fibroids, submucosal 08/23/2020   Lipoma of skin and subcutaneous tissue of neck 08/11/2020   Hyperlipidemia 05/23/2020   Iron deficiency 10/22/2019   Menorrhagia with regular cycle 02/09/2019   Right knee pain 11/30/2016   Post traumatic stress disorder (PTSD) 11/09/2015   Bipolar disorder (HCC) 07/04/2015   Vaginal discharge 03/27/2011   Fibromyalgia 06/26/2010   Obesity 01/17/2010    Class: Chronic   Breast pain 07/12/2008   Lumbar disc herniation of L5-S1 on the left side (MRI 2012 s/p surgery)  02/26/2008   Gastroesophageal reflux disease 03/04/2006  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    Past Surgical History:  Procedure Laterality Date   CHOLECYSTECTOMY  1999   colonscopy  07/2020   f/u in 5 yrs   HYSTEROSCOPY N/A 10/04/2020   Procedure: DIAGNOSTIC HYSTEROSCOPY;  Surgeon: Warden Fillers, MD;  Location: [facility];  Service: Gynecology;  Laterality: N/A;   LEFT OOPHORECTOMY     age 11   LIPOMA EXCISION Left 03/02/2021   Procedure: JXBJYN EXCISION LIPOMA;  Surgeon: Gaynelle Adu, MD;  Location: [facility];  Service: General;  Laterality: Left;   LUMBAR LAMINECTOMY/DECOMPRESSION MICRODISCECTOMY N/A 08/14/2012   Procedure: LUMBAR LAMINECTOMY/DECOMPRESSION MICRODISCECTOMY Lumbar 5 -sacrum 1 decompression;  Surgeon:  Emilee Hero, MD;  Location: [facility];  Service: Orthopedics;  Laterality: N/A;  Lumbar 5 -sacrum 1 decompression   MANDIBLE RECONSTRUCTION  1992   upper anfd lower jaw cyst   NASAL SINUS SURGERY     X 2 at age 31 & 4    OB History     Gravida  0   Para  0   Term  0   Preterm  0   AB  0   Living  0      SAB  0   IAB  0   Ectopic  0   Multiple  0   Live Births               Home Medications    Prior to Admission medications   Medication Sig Start Date End Date Taking? Authorizing Provider  naproxen (NAPROSYN) 500 MG tablet Take 500 mg by mouth 2 (two) times daily as needed. 04/29/23  Yes [provider]  diclofenac Sodium (VOLTAREN) 1 % GEL APPLY 4 G TOPICALLY 4 TIMES DAILY 08/09/20   Reymundo Poll, MD  divalproex (DEPAKOTE ER) 500 MG 24 hr tablet Take 2 tablets (1,000 mg total) by mouth daily. 03/01/23   Arfeen, Phillips Grout, MD  fluticasone (FLONASE) 50 MCG/ACT nasal spray Place 1 spray into both nostrils daily. 11/29/21   Reymundo Poll, MD  hydrOXYzine (ATARAX) 10 MG tablet Take 2-3 tablets (20-30 mg total) by mouth daily as needed for anxiety. 03/01/23   Arfeen, Phillips Grout, MD  meloxicam (MOBIC) 7.5 MG tablet TAKE 1 TABLET BY MOUTH EVERY DAY 04/16/23   Reymundo Poll, MD  methocarbamol (ROBAXIN) 500 MG tablet Take 500 mg by mouth every 6 (six) hours as needed. 04/20/22   [provider]  Multiple Vitamins-Minerals (ADULT ONE DAILY GUMMIES PO) Take 2 tablets by mouth daily.    [provider]  pregabalin (LYRICA) 150 MG capsule TAKE 1 CAPSULE BY MOUTH TWICE A DAY 01/14/23   Reymundo Poll, MD    Family History Family History  Problem Relation Age of Onset   Bipolar disorder Mother    Alcohol abuse Mother    Drug abuse Mother    Other Mother        DDD   Hypertension Mother    Hyperlipidemia Mother    Depression Mother    Anxiety disorder Mother    Alcoholism Mother    Colon polyps Mother    Bipolar disorder Father     Other Father        basal cell nevus syndrome   Depression Father    Drug abuse Sister    Colon polyps Sister    Drug abuse Brother    Other Brother        basal cell syndrome   Breast  cancer Maternal Aunt    Breast cancer Maternal Aunt    Diabetes Maternal Grandmother    Heart attack Maternal Grandmother    Cancer Maternal Grandmother        stomach   Stomach cancer Other        uncle   Cancer Other        unknown grandfather   Diabetes Other        grandmother, aunt and 2 uncles   Colon cancer Other        cousin at age 65    Social History Social History   Tobacco Use   Smoking status: Never   Smokeless tobacco: Never  Vaping Use   Vaping status: Never Used  Substance Use Topics   Alcohol use: Yes    Alcohol/week: 0.0 standard drinks of alcohol    Comment: socially    Drug use: No     Allergies   Patient has no known allergies.   Review of Systems Review of Systems  Constitutional:  Negative for chills, fever and unexpected weight change.  Musculoskeletal:  Positive for gait problem.       Pain over the left lateral ankle and dorsal midfoot Mild swelling but no bruising, redness, warmth  Skin:  Negative for color change and wound.  Neurological:  Positive for weakness. Negative for numbness.     Physical Exam Triage Vital Signs ED Triage Vitals  Encounter Vitals Group     BP 05/23/23 1207 105/69     Systolic BP Percentile --      Diastolic BP Percentile --      Pulse Rate 05/23/23 1207 60     Resp 05/23/23 1207 18     Temp 05/23/23 1207 97.9 F (36.6 C)     Temp Source 05/23/23 1207 Oral     SpO2 05/23/23 1207 94 %     Weight --      Height --      Head Circumference --      Peak Flow --      Pain Score 05/23/23 1204 3     Pain Loc --      Pain Education --      Exclude from Growth Chart --    No data found.  Updated Vital Signs BP 105/69 (BP Location: Left Arm) Comment (BP Location): large cuff  Pulse 60   Temp 97.9 F (36.6 C)  (Oral)   Resp 18   LMP 05/20/2023   SpO2 94%   Visual Acuity Right Eye Distance:   Left Eye Distance:   Bilateral Distance:    Right Eye Near:   Left Eye Near:    Bilateral Near:     Physical Exam Vitals reviewed.  Constitutional:      General: She is not in acute distress.    Appearance: Normal appearance. She is not ill-appearing, toxic-appearing or diaphoretic.  Pulmonary:     Effort: Pulmonary effort is normal.  Musculoskeletal:     Comments: Left ankle: No visible erythema or swelling. Range of motion is limited due to pain and weakness.  Passive dorsiflexion elicits pain over the proximal, dorsal midfoot. Anterior drawer negative Tenderness over the soft tissue overlying the navicular and ankle joint as well as the distal medial malleolus No sign of peroneal tendon subluxations or tenderness to palpation No sensory deficits Dorsalis pedis pulse intact, Posterior tibial pulse intact    Skin:    General: Skin is warm.     Capillary Refill:  Capillary refill takes 2 to 3 seconds.     Coloration: Skin is not pale.     Findings: No bruising, erythema or rash.  Neurological:     General: No focal deficit present.     Mental Status: She is alert.     Sensory: No sensory deficit.     Limited Ultrasound of the left ankle:  - The ankle joint was visualized in LAX. No hypoechoic areas suggestive of effusion.  There is soft tissue edema within the joint, more prominently on the lateral aspect. - The ATFL has no intra tendonous or surrounding hypoechoic changes seen in LAX - The CFL has no intra tendonous or surrounding hypoechoic changes seen in LAX  - The posterior edge of the medial malleolus is visualized w/out disruption of the ostium.  -The talus and navicular was visualized and long axis.  No obvious defects of the ostium. -The extensor tendons were visualized with no hypoechoic fluid accumulation surrounding the tendon sheath.  Summary: Soft tissue edema of the  left ankle joint  Ultrasound and interpretation by Dr. Webb Silversmith    UC Treatments / Results  Labs (all labs ordered are listed, but only abnormal results are displayed) Labs Reviewed - No data to display  EKG   Radiology No results found.  Procedures Procedures (including critical care time)  Medications Ordered in UC Medications - No data to display  Initial Impression / Assessment and Plan / UC Course  I have reviewed the triage vital signs and the nursing notes.  Pertinent labs & imaging results that were available during my care of the patient were reviewed by me and considered in my medical decision making (see chart for details).     Acute recurrent pain over the left dorsal midfoot and ankle -The patient had an acute injury last November with concern for stress fracture.  Her pain abated but unfortunately this recurrence with difficulty in weightbearing is concerning for stress reaction/stress fracture. - X-rays of the left foot reveals some vague changes over the left cuboid/talus area but is difficult to see with this imaging. - Limited ultrasound does not show any acute defects or tears but there is soft tissue edema of the ankle joint. - Given the patient's level of pain that is inconsistent with imaging we will put her in a cam walker boot for now. - I recommended following up with Dr. Susa Simmonds over at [facility] orthopedics for further evaluation.  Given her symptoms she will likely need an MRI for further evaluation. - We discussed elevation and icing with the addition of Tylenol for pain control.  She is already taking naproxen twice daily which she can continue.   Final Clinical Impressions(s) / UC Diagnoses   Final diagnoses:  Left foot pain   Discharge Instructions   None    ED Prescriptions   None    PDMP not reviewed this encounter.   Ivor Messier, MD 05/23/23 [phone]

## 2023-05-24 DIAGNOSIS — M79672 Pain in left foot: Secondary | ICD-10-CM | POA: Diagnosis not present

## 2023-05-24 DIAGNOSIS — M25572 Pain in left ankle and joints of left foot: Secondary | ICD-10-CM | POA: Diagnosis not present

## 2023-05-28 ENCOUNTER — Ambulatory Visit (INDEPENDENT_AMBULATORY_CARE_PROVIDER_SITE_OTHER): Admitting: Licensed Clinical Social Worker

## 2023-05-28 DIAGNOSIS — F3131 Bipolar disorder, current episode depressed, mild: Secondary | ICD-10-CM

## 2023-05-28 DIAGNOSIS — M545 Low back pain, unspecified: Secondary | ICD-10-CM | POA: Diagnosis not present

## 2023-05-30 DIAGNOSIS — M545 Low back pain, unspecified: Secondary | ICD-10-CM | POA: Diagnosis not present

## 2023-06-04 ENCOUNTER — Encounter (HOSPITAL_COMMUNITY): Payer: Self-pay | Admitting: Licensed Clinical Social Worker

## 2023-06-04 DIAGNOSIS — M545 Low back pain, unspecified: Secondary | ICD-10-CM | POA: Diagnosis not present

## 2023-06-04 NOTE — Progress Notes (Signed)
   THERAPIST PROGRESS NOTE  Session Time: 4:30pm-5:30pm  Participation Level: Active  Behavioral Response: NeatLethargicIrritable  Type of Therapy: Individual Therapy  Treatment Goals addressed:  Reduce frequency, intensity, and duration of depression symptoms so that daily functioning is improved   ProgressTowards Goals: Progressing  Interventions: Supportive  Summary: HELANE BRICENO is a 43 y.o. female who presents with Bipolar I, depressed.   Suicidal/Homicidal: Nowithout intent/plan  Therapist Response: Krissy engaged well in individual and professional with Facilities manager.  Clinician utilized supportive counseling to explore thoughts feelings and interactions.  Clinician provided time and space for Metha to share updates and frustrations at work and at home.  Clinician explored coping skills using cognitive behavioral therapy.  Clinician identified the importance of maintaining boundaries, advocating for self, and taking time to thoughtfully responds rather than reacting out of emotion.  Clinician assessed pain and noted chronic pain in ankle due to injury at work.  Clinician validated increased emotionality due to pain.  Plan: Return again in 2 weeks.  Diagnosis: Bipolar 1 disorder, depressed, mild (HCC)  Collaboration of Care: Patient refused AEB none required  Patient/Guardian was advised Release of Information must be obtained prior to any record release in order to collaborate their care with an outside provider. Patient/Guardian was advised if they have not already done so to contact the registration department to sign all necessary forms in order for us  to release information regarding their care.   Consent: Patient/Guardian gives verbal consent for treatment and assignment of benefits for services provided during this visit. Patient/Guardian expressed understanding and agreed to proceed.   Merleen Stare [place], LCSW 06/04/2023

## 2023-06-05 ENCOUNTER — Ambulatory Visit (HOSPITAL_COMMUNITY): Admitting: Licensed Clinical Social Worker

## 2023-06-05 DIAGNOSIS — F3131 Bipolar disorder, current episode depressed, mild: Secondary | ICD-10-CM

## 2023-06-10 DIAGNOSIS — M545 Low back pain, unspecified: Secondary | ICD-10-CM | POA: Diagnosis not present

## 2023-06-11 ENCOUNTER — Ambulatory Visit (INDEPENDENT_AMBULATORY_CARE_PROVIDER_SITE_OTHER): Admitting: Licensed Clinical Social Worker

## 2023-06-11 ENCOUNTER — Encounter (HOSPITAL_COMMUNITY): Payer: Self-pay | Admitting: Licensed Clinical Social Worker

## 2023-06-11 DIAGNOSIS — F3131 Bipolar disorder, current episode depressed, mild: Secondary | ICD-10-CM

## 2023-06-11 NOTE — Progress Notes (Signed)
   THERAPIST PROGRESS NOTE  Session Time: 4:30pm-5:25pm  Participation Level: Active  Behavioral Response: NeatAlertDepressed  Type of Therapy: Individual Therapy  Treatment Goals addressed: Reduce frequency, intensity, and duration of depression symptoms so that daily functioning is improved     ProgressTowards Goals: Progressing  Interventions: Motivational Interviewing  Summary: Jordan [name] is a 43 y.o. female who presents with Bipolar I depressed, mild.   Suicidal/Homicidal: Nowithout intent/plan  Therapist Response: Mable engaged well in individual in person session with clinician. Clinician utilized MI OARS to reflect and summarize thoughts and feelings. Clinician explored current mood status and noted significant boredom due to being out of work with injured ankle. Clinician processed activities that can be done while resting leg. Clinician explored plans for work/school. Clinician discussed relationships with partner, children, and family. Clinician assessed and explored coping skills to improve mood and reduce boredom.   Plan: Return again in 1-2 weeks.  Diagnosis: Bipolar 1 disorder, depressed, mild (HCC)  Collaboration of Care: Patient refused AEB none required  Patient/Guardian was advised Release of Information must be obtained prior to any record release in order to collaborate their care with an outside provider. Patient/Guardian was advised if they have not already done so to contact the registration department to sign all necessary forms in order for us  to release information regarding their care.   Consent: Patient/Guardian gives verbal consent for treatment and assignment of benefits for services provided during this visit. Patient/Guardian expressed understanding and agreed to proceed.   [name], LCSW 06/11/2023

## 2023-06-14 DIAGNOSIS — M25572 Pain in left ankle and joints of left foot: Secondary | ICD-10-CM | POA: Diagnosis not present

## 2023-06-17 ENCOUNTER — Ambulatory Visit (INDEPENDENT_AMBULATORY_CARE_PROVIDER_SITE_OTHER): Payer: Self-pay | Admitting: Student

## 2023-06-17 VITALS — BP 100/67 | HR 63 | Temp 98.3°F | Ht 69.0 in | Wt 281.7 lb

## 2023-06-17 DIAGNOSIS — F419 Anxiety disorder, unspecified: Secondary | ICD-10-CM

## 2023-06-17 DIAGNOSIS — R631 Polydipsia: Secondary | ICD-10-CM

## 2023-06-17 DIAGNOSIS — E785 Hyperlipidemia, unspecified: Secondary | ICD-10-CM

## 2023-06-17 DIAGNOSIS — F4310 Post-traumatic stress disorder, unspecified: Secondary | ICD-10-CM

## 2023-06-17 DIAGNOSIS — F319 Bipolar disorder, unspecified: Secondary | ICD-10-CM | POA: Diagnosis not present

## 2023-06-17 DIAGNOSIS — R5383 Other fatigue: Secondary | ICD-10-CM

## 2023-06-17 DIAGNOSIS — F3132 Bipolar disorder, current episode depressed, moderate: Secondary | ICD-10-CM

## 2023-06-17 DIAGNOSIS — M545 Low back pain, unspecified: Secondary | ICD-10-CM | POA: Diagnosis not present

## 2023-06-17 LAB — POCT GLYCOSYLATED HEMOGLOBIN (HGB A1C): Hemoglobin A1C: 5.7 % — AB (ref 4.0–5.6)

## 2023-06-17 LAB — GLUCOSE, CAPILLARY: Glucose-Capillary: 83 mg/dL (ref 70–99)

## 2023-06-17 MED ORDER — DIVALPROEX SODIUM ER 500 MG PO TB24: 1000.0000 mg | ORAL_TABLET | Freq: Every day | ORAL | 0 refills | Status: DC

## 2023-06-17 NOTE — Progress Notes (Unsigned)
 Established Patient Office Visit  Subjective   Patient ID: Jordan [name], female    DOB: May 23, 1980  Age: 43 y.o. MRN: [id number]  No chief complaint on file.   HPI This is a 43 year old female with a past medical history of reactive airway disease, GERD, hyperlipidemia, fibromyalgia who presents for concerns of vaginal discharge.  Please see assessment and plan for full HPI.   Past Medical History:  Diagnosis Date   Alcohol abuse    Allergic rhinitis    pt takes flonase  for episodes, no episodes in 2012   Anemia    Anxiety    Basal cell carcinoma    recurrent in left maxillary, has had 5 surguries, last one 2003   Basal cell nevus syndrome    dx age 89   Bipolar 1 disorder (HCC)    BPPV (benign paroxysmal positional vertigo) 03/01/2016   Candidiasis, vagina    recurrent, pt has made lifestyle modifications and none recently (as of 8/12)   Cervicitis 03/19/2019   Cyst of nasal sinus    Depression    B-Polar   Fatigue 08/11/2020   Fibroma    left ovarian   Fibromyalgia    Gardnerella infection    + in 08/08   GERD (gastroesophageal reflux disease)    occ   Hematoma, subungual, finger, right, initial encounter 01/23/2021   Herniated disc    Hyperlipidemia    Joint pain    Lactose intolerance    Obesity    Ovarian cyst 08/23/2020   OVARIAN CYSTECTOMY, HX OF 03/01/2006   ovarian fibroma-left 1996, L ovary and fallopian tube removed     Paronychia of third finger of left hand 07/28/2010   PLANTAR FASCIITIS, BILATERAL 05/24/2009   Pre-diabetes    PTSD (post-traumatic stress disorder)    Vaginitis 05/06/2015   [place]    hx of herpetic requiring I+D,( was bitten by autistic child that cares fr    ROS   AS per assessment and plan  Objective:     LMP 05/20/2023  BP Readings from Last 3 Encounters:  05/23/23 105/69  04/15/23 109/71  04/15/23 110/74   Wt Readings from Last 3 Encounters:  04/15/23 278 lb (126.1 kg)  04/15/23 280 lb 6.4 oz (127.2 kg)   03/18/23 285 lb 4.8 oz (129.4 kg)   SpO2 Readings from Last 3 Encounters:  05/23/23 94%  04/15/23 100%  04/15/23 100%      Physical Exam   No results found for any visits on 06/17/23.  Last CBC Lab Results  Component Value Date   WBC 6.1 03/14/2022   HGB 13.9 03/14/2022   HCT 41.7 03/14/2022   MCV 87 03/14/2022   MCH 29.1 03/14/2022   RDW 13.5 03/14/2022   PLT 204 03/14/2022   Last metabolic panel Lab Results  Component Value Date   GLUCOSE 95 02/11/2023   NA 139 02/11/2023   K 4.5 02/11/2023   CL 102 02/11/2023   CO2 25 02/11/2023   BUN 13 02/11/2023   CREATININE 0.78 02/11/2023   EGFR 97 02/11/2023   CALCIUM 9.0 02/11/2023   PROT 6.0 02/11/2023   ALBUMIN 3.9 02/11/2023   LABGLOB 2.1 02/11/2023   AGRATIO 1.7 03/14/2022   BILITOT <0.2 02/11/2023   ALKPHOS 44 02/11/2023   AST 13 02/11/2023   ALT 9 02/11/2023   Last lipids Lab Results  Component Value Date   CHOL 242 (H) 03/14/2022   HDL 78 03/14/2022   LDLCALC 151 (H) 03/14/2022  TRIG 76 03/14/2022   CHOLHDL 3.1 03/14/2022   Last hemoglobin A1c Lab Results  Component Value Date   HGBA1C 5.6 03/14/2022   Last thyroid  functions Lab Results  Component Value Date   TSH 1.850 08/10/2020   T3TOTAL 107 07/09/2019     The 10-year ASCVD risk score (Arnett DK, et al., 2019) is: 0.2%    Assessment & Plan:  Fatigue 4 weeks feeling bogged down, slight headaches, reports sweet cravings, polydipsia, polyuria.   Lipid disorder Lab Results  Component Value Date   CHOL 242 (H) 03/14/2022   HDL 78 03/14/2022   LDLCALC 151 (H) 03/14/2022   TRIG 76 03/14/2022   CHOLHDL 3.1 03/14/2022    OP medication regimen are Lyrica  150 mg BID, robaxin  500 mg q6hr prn   Bipolar disorder (HCC)  Depakote  1000 mg and hydroxide 10 mg BID a day  - Has been out of depakote  for 3 days  - Lyrica  150 mg BID  ` Problem List Items Addressed This Visit   None   No follow-ups on file.    Lanney Pitts, DO

## 2023-06-17 NOTE — Patient Instructions (Signed)
 Thank you, [name] for allowing us  to provide your care today. Today we discussed:  I will check you labs today   I have ordered the following labs for you:  Lab Orders         Iron , TIBC and Ferritin Panel         TSH + free T4         Lipid Profile         BMP8+Anion Gap         POC Hbg A1C       Tests ordered today:  As above   Referrals ordered today:   Referral Orders  No referral(s) requested today     I have ordered the following medication/changed the following medications:   Stop the following medications: Medications Discontinued During This Encounter  Medication Reason   divalproex  (DEPAKOTE  ER) 500 MG 24 hr tablet Reorder   methocarbamol  (ROBAXIN ) 500 MG tablet      Start the following medications: Meds ordered this encounter  Medications   divalproex  (DEPAKOTE  ER) 500 MG 24 hr tablet    Sig: Take 2 tablets (1,000 mg total) by mouth daily for 7 days.    Dispense:  14 tablet    Refill:  0     Follow up:  follow up as needed     Remember:   Should you have any questions or concerns please call the internal medicine clinic at [phone].     Lanney Pitts, DO [facility]

## 2023-06-18 ENCOUNTER — Encounter (HOSPITAL_COMMUNITY): Payer: Self-pay | Admitting: Licensed Clinical Social Worker

## 2023-06-18 DIAGNOSIS — R631 Polydipsia: Secondary | ICD-10-CM | POA: Insufficient documentation

## 2023-06-18 DIAGNOSIS — M545 Low back pain, unspecified: Secondary | ICD-10-CM | POA: Diagnosis not present

## 2023-06-18 NOTE — Assessment & Plan Note (Signed)
 Reports that she follows Dr Carlos Chesterfield, OP medication regimen includes Depakote  1000 mg and hydroxide 10 mg BID a day. States that she has been out of Depakote  for 3 days, will be following up this Dr Carlos Chesterfield this Friday.

## 2023-06-18 NOTE — Assessment & Plan Note (Signed)
 Patient reports ongoing fatigue for the past 4 weeks, reports that she feels bogged down, has slight headaches, not much of energy. She reports that she has family history of thyroid  disorders. She does take Atarax  BID, reports that she has been taking it since last year and she is aware that it can also make her feel fatigued.   - Check BMP, iron  studies, TSH

## 2023-06-18 NOTE — Assessment & Plan Note (Signed)
 Lab Results  Component Value Date   CHOL 242 (H) 03/14/2022   HDL 78 03/14/2022   LDLCALC 151 (H) 03/14/2022   TRIG 76 03/14/2022   CHOLHDL 3.1 03/14/2022   The 10-year ASCVD risk score (Arnett DK, et al., 2019) is: 0.1%   Values used to calculate the score:     Age: 43 years     Sex: Female     Is Non-Hispanic African American: Yes     Diabetic: No     Tobacco smoker: No     Systolic Blood Pressure: 100 mmHg     Is BP treated: No     HDL Cholesterol: 78 mg/dL     Total Cholesterol: 242 mg/dL  Presently not on any medications, controlled by diet.  - Check Lipid panel today

## 2023-06-18 NOTE — Assessment & Plan Note (Addendum)
 Reports that for the past month, she has increase thirst, polydipsia and polyuria. States that she has been craving sweets lately. Reports that she does have a family history of Diabetes. Last A1c 5.6; will check an A1c today.   - Check Hgb A1c  - Check BMP

## 2023-06-18 NOTE — Progress Notes (Signed)
 Virtual Visit via Video Note  I connected with Jordan [name] on 06/05/23 at  4:30 PM EDT by a video enabled telemedicine application and verified that I am speaking with the correct person using two identifiers.  Location: Patient: home Provider: home office   I discussed the limitations of evaluation and management by telemedicine and the availability of in person appointments. The patient expressed understanding and agreed to proceed.    I discussed the assessment and treatment plan with the patient. The patient was provided an opportunity to ask questions and all were answered. The patient agreed with the plan and demonstrated an understanding of the instructions.   The patient was advised to call back or seek an in-person evaluation if the symptoms worsen or if the condition fails to improve as anticipated.  I provided 45 minutes of non-face-to-face time during this encounter.   Seldon Dago, LCSW   THERAPIST PROGRESS NOTE  Session Time: 4:30pm-5:15pm  Participation Level: Active  Behavioral Response: Well GroomedAlertDepressed and Irritable  Type of Therapy: Individual Therapy  Treatment Goals addressed:   Reduce frequency, intensity, and duration of depression symptoms so that daily functioning is improved   ProgressTowards Goals: Progressing  Interventions: CBT  Summary: Jordan [name] is a 43 y.o. female who presents with Bipolar I, depressed.   Suicidal/Homicidal: Nowithout intent/plan  Therapist Response: Jordan [name] engaged well in individual virtual session with clinician. Clinician utilized CBT to process thoughts, feelings, and interactions. Clinician explored updates on relationship and interactions with partner. Clinician reflected ongoing discomfort in the relationship and no change in living situation. Clinician explored Jordan [name]'s motivation to change the situation and noted that the want and the will are at different levels. Clinician discussed changes  in Jordan [name]'s health which makes these changes more difficult. Jordan [name] injured her leg at work and now wears a boot. She shared that she is not working and has been trying to find things to do that keep her active and reduce depression.   Plan: Return again in 1-2 weeks.  Diagnosis: Bipolar 1 disorder, depressed, mild (HCC)  Collaboration of Care: Other provider involved in patient's care AEB reviewed notes  Patient/Guardian was advised Release of Information must be obtained prior to any record release in order to collaborate their care with an outside provider. Patient/Guardian was advised if they have not already done so to contact the registration department to sign all necessary forms in order for us  to release information regarding their care.   Consent: Patient/Guardian gives verbal consent for treatment and assignment of benefits for services provided during this visit. Patient/Guardian expressed understanding and agreed to proceed.   Jordan Stare [place], LCSW 06/18/2023

## 2023-06-19 ENCOUNTER — Encounter (HOSPITAL_COMMUNITY): Payer: Self-pay

## 2023-06-19 ENCOUNTER — Ambulatory Visit (HOSPITAL_COMMUNITY): Admitting: Licensed Clinical Social Worker

## 2023-06-19 LAB — BMP8+ANION GAP
Anion Gap: 11 mmol/L (ref 10.0–18.0)
BUN/Creatinine Ratio: 23 (ref 9–23)
BUN: 16 mg/dL (ref 6–24)
CO2: 24 mmol/L (ref 20–29)
Calcium: 9 mg/dL (ref 8.7–10.2)
Chloride: 104 mmol/L (ref 96–106)
Creatinine, Ser: 0.7 mg/dL (ref 0.57–1.00)
Glucose: 85 mg/dL (ref 70–99)
Potassium: 4.3 mmol/L (ref 3.5–5.2)
Sodium: 139 mmol/L (ref 134–144)
eGFR: 110 mL/min/{1.73_m2} (ref >59)

## 2023-06-19 LAB — TSH+FREE T4
Free T4: 0.93 ng/dL (ref 0.82–1.77)
TSH: 2.65 u[IU]/mL (ref 0.450–4.500)

## 2023-06-19 LAB — LIPID PANEL
Chol/HDL Ratio: 2.5 ratio (ref 0.0–4.4)
Cholesterol, Total: 216 mg/dL — ABNORMAL HIGH (ref 100–199)
HDL: 87 mg/dL (ref >39)
LDL Chol Calc (NIH): 114 mg/dL — ABNORMAL HIGH (ref 0–99)
Triglycerides: 89 mg/dL (ref 0–149)
VLDL Cholesterol Cal: 15 mg/dL (ref 5–40)

## 2023-06-19 LAB — IRON,TIBC AND FERRITIN PANEL
Ferritin: 16 ng/mL (ref 15–150)
Iron Saturation: 20 % (ref 15–55)
Iron: 58 ug/dL (ref 27–159)
Total Iron Binding Capacity: 296 ug/dL (ref 250–450)
UIBC: 238 ug/dL (ref 131–425)

## 2023-06-19 NOTE — Progress Notes (Signed)
 Internal Medicine Clinic Attending  Case discussed with the resident at the time of the visit.  We reviewed the resident's history and exam and pertinent patient test results.  I agree with the assessment, diagnosis, and plan of care documented in the resident's note.

## 2023-06-21 ENCOUNTER — Telehealth (HOSPITAL_BASED_OUTPATIENT_CLINIC_OR_DEPARTMENT_OTHER): Admitting: Psychiatry

## 2023-06-21 ENCOUNTER — Encounter (HOSPITAL_COMMUNITY): Payer: Self-pay | Admitting: Psychiatry

## 2023-06-21 VITALS — Wt 281.0 lb

## 2023-06-21 DIAGNOSIS — F3132 Bipolar disorder, current episode depressed, moderate: Secondary | ICD-10-CM

## 2023-06-21 DIAGNOSIS — F419 Anxiety disorder, unspecified: Secondary | ICD-10-CM

## 2023-06-21 DIAGNOSIS — F4310 Post-traumatic stress disorder, unspecified: Secondary | ICD-10-CM | POA: Diagnosis not present

## 2023-06-21 MED ORDER — DIVALPROEX SODIUM ER 500 MG PO TB24: 1000.0000 mg | ORAL_TABLET | Freq: Every day | ORAL | 2 refills | Status: DC

## 2023-06-21 MED ORDER — HYDROXYZINE HCL 10 MG PO TABS: 20.0000 mg | ORAL_TABLET | Freq: Every day | ORAL | 2 refills | Status: DC | PRN
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
## 2023-06-24 ENCOUNTER — Telehealth (HOSPITAL_COMMUNITY): Payer: 59 | Admitting: Psychiatry

## 2023-06-24 DIAGNOSIS — M1711 Unilateral primary osteoarthritis, right knee: Secondary | ICD-10-CM | POA: Diagnosis not present

## 2023-06-25 DIAGNOSIS — M545 Low back pain, unspecified: Secondary | ICD-10-CM | POA: Diagnosis not present

## 2023-07-01 DIAGNOSIS — M1711 Unilateral primary osteoarthritis, right knee: Secondary | ICD-10-CM | POA: Diagnosis not present

## 2023-07-02 DIAGNOSIS — M545 Low back pain, unspecified: Secondary | ICD-10-CM | POA: Diagnosis not present

## 2023-07-03 ENCOUNTER — Ambulatory Visit (INDEPENDENT_AMBULATORY_CARE_PROVIDER_SITE_OTHER): Admitting: Licensed Clinical Social Worker

## 2023-07-03 ENCOUNTER — Encounter (HOSPITAL_COMMUNITY): Payer: Self-pay | Admitting: Licensed Clinical Social Worker

## 2023-07-03 DIAGNOSIS — F3131 Bipolar disorder, current episode depressed, mild: Secondary | ICD-10-CM | POA: Diagnosis not present

## 2023-07-03 DIAGNOSIS — M545 Low back pain, unspecified: Secondary | ICD-10-CM | POA: Diagnosis not present

## 2023-07-03 NOTE — Progress Notes (Signed)
 Virtual Visit via Video Note  I connected with Jordan [name] on 07/03/23 at  8:00 AM EDT by a video enabled telemedicine application and verified that I am speaking with the correct person using two identifiers.  Location: Patient: home Provider: home office   I discussed the limitations of evaluation and management by telemedicine and the availability of in person appointments. The patient expressed understanding and agreed to proceed.  I discussed the assessment and treatment plan with the patient. The patient was provided an opportunity to ask questions and all were answered. The patient agreed with the plan and demonstrated an understanding of the instructions.   The patient was advised to call back or seek an in-person evaluation if the symptoms worsen or if the condition fails to improve as anticipated.  I provided 45 minutes of non-face-to-face time during this encounter.   Seldon Dago, LCSW   THERAPIST PROGRESS NOTE  Session Time: 8:00am-8:45am  Participation Level: Active  Behavioral Response: NeatAlertEuthymic  Type of Therapy: Individual Therapy  Treatment Goals addressed: Reduce frequency, intensity, and duration of depression symptoms so that daily functioning is improved     ProgressTowards Goals: Progressing  Interventions: CBT  Summary: Jordan [name] is a 43 y.o. female who presents with Bipolar I, depressed, mild.   Suicidal/Homicidal: Nowithout intent/plan  Therapist Response: Raphael engaged well in individual virtual session with clinician. Clinician utilized CBT to process thoughts, feelings, and behaviors. Clinician reflected ongoing frustration in relationship, noting her need for her own space. Clinician processed recent interaction with partner and explored more specific and clear communication. Clinician processed other updates with health and work. Clinician reflected steady mood overall and positive use of coping skills.   Plan: Return  again in 2 weeks.  Diagnosis: Bipolar 1 disorder, depressed, mild (HCC)  Collaboration of Care: Patient refused AEB none required  Patient/Guardian was advised Release of Information must be obtained prior to any record release in order to collaborate their care with an outside provider. Patient/Guardian was advised if they have not already done so to contact the registration department to sign all necessary forms in order for us  to release information regarding their care.   Consent: Patient/Guardian gives verbal consent for treatment and assignment of benefits for services provided during this visit. Patient/Guardian expressed understanding and agreed to proceed.   Merleen Stare [place], LCSW 07/03/2023

## 2023-07-05 ENCOUNTER — Other Ambulatory Visit: Payer: Self-pay

## 2023-07-05 DIAGNOSIS — M25561 Pain in right knee: Secondary | ICD-10-CM

## 2023-07-05 MED ORDER — MELOXICAM 7.5 MG PO TABS: 7.5000 mg | ORAL_TABLET | Freq: Every day | ORAL | 0 refills | Status: DC

## 2023-07-08 ENCOUNTER — Other Ambulatory Visit: Payer: Self-pay

## 2023-07-08 DIAGNOSIS — M1711 Unilateral primary osteoarthritis, right knee: Secondary | ICD-10-CM | POA: Diagnosis not present

## 2023-07-08 MED ORDER — FLUTICASONE PROPIONATE 50 MCG/ACT NA SUSP: 1.0000 | Freq: Every day | NASAL | 2 refills | Status: DC

## 2023-07-08 NOTE — Telephone Encounter (Signed)
 Medication sent to pharmacy

## 2023-07-09 ENCOUNTER — Encounter (HOSPITAL_COMMUNITY): Payer: Self-pay | Admitting: Licensed Clinical Social Worker

## 2023-07-09 ENCOUNTER — Ambulatory Visit (INDEPENDENT_AMBULATORY_CARE_PROVIDER_SITE_OTHER): Admitting: Licensed Clinical Social Worker

## 2023-07-09 DIAGNOSIS — F3131 Bipolar disorder, current episode depressed, mild: Secondary | ICD-10-CM

## 2023-07-09 DIAGNOSIS — M545 Low back pain, unspecified: Secondary | ICD-10-CM | POA: Diagnosis not present

## 2023-07-09 NOTE — Progress Notes (Signed)
 Virtual Visit via Video Note  I connected with Jordan [name] on 07/09/23 at  8:00 AM EDT by a video enabled telemedicine application and verified that I am speaking with the correct person using two identifiers.  Location: Patient: home Provider: office   I discussed the limitations of evaluation and management by telemedicine and the availability of in person appointments. The patient expressed understanding and agreed to proceed.   I discussed the assessment and treatment plan with the patient. The patient was provided an opportunity to ask questions and all were answered. The patient agreed with the plan and demonstrated an understanding of the instructions.   The patient was advised to call back or seek an in-person evaluation if the symptoms worsen or if the condition fails to improve as anticipated.  I provided 45 minutes of non-face-to-face time during this encounter.   Seldon Dago, LCSW   THERAPIST PROGRESS NOTE  Session Time: 8:00am-8:45am  Participation Level: Active  Behavioral Response: CasualLethargicDepressed  Type of Therapy: Individual Therapy  Treatment Goals addressed:  Reduce frequency, intensity, and duration of depression symptoms so that daily functioning is improved   ProgressTowards Goals: Progressing  Interventions: Motivational Interviewing  Summary: Jordan [name] is a 43 y.o. female who presents with Bipolar I, depressed, mild.   Suicidal/Homicidal: Nowithout intent/plan  Therapist Response: Tykiera engaged well in individual virtual fashion with clinician.  Clinician utilized motivational interviewing to reflect and summarized thoughts and feelings.  Clinician processed recent loss of cousin, who passed away over the weekend.  Clinician reflected the challenges of being the "strong one" in her family.  Clinician identified the importance of allowing herself to break down and cry.  Clinician processed relationship challenges.  Clinician  reflected the difficulty of having good communication with someone who is a poor Visual merchandiser.  Clinician offered supportive counseling.  Plan: Return again in 1-2 weeks.  Diagnosis: Bipolar 1 disorder, depressed, mild (HCC)  Collaboration of Care: Patient refused AEB none required  Patient/Guardian was advised Release of Information must be obtained prior to any record release in order to collaborate their care with an outside provider. Patient/Guardian was advised if they have not already done so to contact the registration department to sign all necessary forms in order for us  to release information regarding their care.   Consent: Patient/Guardian gives verbal consent for treatment and assignment of benefits for services provided during this visit. Patient/Guardian expressed understanding and agreed to proceed.   Merleen Stare [place], LCSW 07/09/2023

## 2023-07-11 DIAGNOSIS — M545 Low back pain, unspecified: Secondary | ICD-10-CM | POA: Diagnosis not present

## 2023-07-16 ENCOUNTER — Other Ambulatory Visit: Payer: Self-pay

## 2023-07-16 ENCOUNTER — Encounter (HOSPITAL_COMMUNITY): Payer: Self-pay | Admitting: Licensed Clinical Social Worker

## 2023-07-16 ENCOUNTER — Ambulatory Visit (HOSPITAL_COMMUNITY): Admitting: Licensed Clinical Social Worker

## 2023-07-16 DIAGNOSIS — M797 Fibromyalgia: Secondary | ICD-10-CM

## 2023-07-16 DIAGNOSIS — F3131 Bipolar disorder, current episode depressed, mild: Secondary | ICD-10-CM | POA: Diagnosis not present

## 2023-07-16 DIAGNOSIS — M25572 Pain in left ankle and joints of left foot: Secondary | ICD-10-CM | POA: Diagnosis not present

## 2023-07-16 MED ORDER — PREGABALIN 150 MG PO CAPS: 150.0000 mg | ORAL_CAPSULE | Freq: Two times a day (BID) | ORAL | 0 refills | Status: DC

## 2023-07-16 NOTE — Progress Notes (Signed)
 Virtual Visit via Video Note  I connected with Jordan [name] on 07/16/23 at  8:00 AM EDT by a video enabled telemedicine application and verified that I am speaking with the correct person using two identifiers.  Location: Patient: home Provider: home office   I discussed the limitations of evaluation and management by telemedicine and the availability of in person appointments. The patient expressed understanding and agreed to proceed.   I discussed the assessment and treatment plan with the patient. The patient was provided an opportunity to ask questions and all were answered. The patient agreed with the plan and demonstrated an understanding of the instructions.   The patient was advised to call back or seek an in-person evaluation if the symptoms worsen or if the condition fails to improve as anticipated.  I provided 45 minutes of non-face-to-face time during this encounter.   Seldon Dago, LCSW   THERAPIST PROGRESS NOTE  Session Time: 8:00am-8:45am  Participation Level: Active  Behavioral Response: CasualLethargicDepressed, Irritable, and in pain  Type of Therapy: Individual Therapy  Treatment Goals addressed:  Reduce frequency, intensity, and duration of depression symptoms so that daily functioning is improved     ProgressTowards Goals: Progressing  Interventions: Motivational Interviewing  Summary: Jordan [name] is a 43 y.o. female who presents with Bipolar I, depressed.   Suicidal/Homicidal: Nowithout intent/plan  Therapist Response: Jordan [name] engaged well in virtual individual session with clinician. Clinician utilized MI OARS to reflect and summarize thoughts, feelings, and interactions. Clinician processed current mood and interactions with partner over the weekend. Jordan [name] shared increased stress and grief due to recent loss and funeral for cousin over the weekend. Clinician explored the connection between stress, grief, and fibromyalgia flare up that is  currently happening. Clinician explored the relationship with partner and noted distress in the relationship ongoing. Clinician validated Jordan [name]'s thoughts and feelings, as well as plan for partner to move into her own apartment. Clinician reflected sadness and encouraged Jordan [name] to take time to grieve and process the loss of her cousin, as well as the numerous other challenges going on.   Plan: Return again in 2 weeks.  Diagnosis: Bipolar 1 disorder, depressed, mild (HCC)  Collaboration of Care: Patient refused AEB none required at this session  Patient/Guardian was advised Release of Information must be obtained prior to any record release in order to collaborate their care with an outside provider. Patient/Guardian was advised if they have not already done so to contact the registration department to sign all necessary forms in order for us  to release information regarding their care.   Consent: Patient/Guardian gives verbal consent for treatment and assignment of benefits for services provided during this visit. Patient/Guardian expressed understanding and agreed to proceed.   Merleen Stare [place], LCSW 07/16/2023

## 2023-07-25 DIAGNOSIS — M545 Low back pain, unspecified: Secondary | ICD-10-CM | POA: Diagnosis not present

## 2023-07-30 DIAGNOSIS — M545 Low back pain, unspecified: Secondary | ICD-10-CM | POA: Diagnosis not present

## 2023-07-31 ENCOUNTER — Ambulatory Visit (INDEPENDENT_AMBULATORY_CARE_PROVIDER_SITE_OTHER): Admitting: Licensed Clinical Social Worker

## 2023-07-31 DIAGNOSIS — F3131 Bipolar disorder, current episode depressed, mild: Secondary | ICD-10-CM | POA: Diagnosis not present

## 2023-08-01 DIAGNOSIS — M545 Low back pain, unspecified: Secondary | ICD-10-CM | POA: Diagnosis not present

## 2023-08-02 ENCOUNTER — Encounter (HOSPITAL_COMMUNITY): Payer: Self-pay | Admitting: Licensed Clinical Social Worker

## 2023-08-02 NOTE — Progress Notes (Signed)
 Virtual Visit via Video Note  I connected with Jordan [name] on 07/31/23 at  8:00 AM EDT by a video enabled telemedicine application and verified that I am speaking with the correct person using two identifiers.  Location: Patient: home Provider: home office   I discussed the limitations of evaluation and management by telemedicine and the availability of in person appointments. The patient expressed understanding and agreed to proceed.   I discussed the assessment and treatment plan with the patient. The patient was provided an opportunity to ask questions and all were answered. The patient agreed with the plan and demonstrated an understanding of the instructions.   The patient was advised to call back or seek an in-person evaluation if the symptoms worsen or if the condition fails to improve as anticipated.  I provided 45 minutes of non-face-to-face time during this encounter.   Seldon Dago, LCSW   THERAPIST PROGRESS NOTE  Session Time: 8:00am-8:45am  Participation Level: Active  Behavioral Response: Well GroomedAlertAngry and Depressed  Type of Therapy: Individual Therapy  Treatment Goals addressed: Reduce frequency, intensity, and duration of depression symptoms so that daily functioning is improved     ProgressTowards Goals: Progressing  Interventions: CBT  Summary: ANTONINA DEZIEL is a 43 y.o. female who presents with Bipolar I, depressed, mild.   Suicidal/Homicidal: Nowithout intent/plan  Therapist Response: Albertia engaged well in individual virtual session with clinician.  Clinician utilized CBT to process triggers, thoughts, feelings, and interactions.  Clinician explored current mood and challenges since last session.  Clinician identified ongoing struggles with boundary setting and relationship.  Roisin shared increasing level of anger towards partner.  Clinician explored basis of these feelings and noted ongoing challenges related to partner's mental  health.  Clinician explored options and decisions to be made.  Clinician explored Annabel's ability to set boundaries and to have her own support network of friends and family.  Plan: Return again in 2 weeks.  Diagnosis: Bipolar 1 disorder, depressed, mild (HCC)  Collaboration of Care: Patient refused AEB none required  Patient/Guardian was advised Release of Information must be obtained prior to any record release in order to collaborate their care with an outside provider. Patient/Guardian was advised if they have not already done so to contact the registration department to sign all necessary forms in order for us  to release information regarding their care.   Consent: Patient/Guardian gives verbal consent for treatment and assignment of benefits for services provided during this visit. Patient/Guardian expressed understanding and agreed to proceed.   Merleen Stare [place], LCSW 08/02/2023

## 2023-08-06 DIAGNOSIS — M545 Low back pain, unspecified: Secondary | ICD-10-CM | POA: Diagnosis not present

## 2023-08-08 DIAGNOSIS — M545 Low back pain, unspecified: Secondary | ICD-10-CM | POA: Diagnosis not present

## 2023-08-12 ENCOUNTER — Ambulatory Visit
Admission: RE | Admit: 2023-08-12 | Discharge: 2023-08-12 | Disposition: A | Discharge status: Home / Self Care | Source: Ambulatory Visit | Attending: Family Medicine | Admitting: Family Medicine

## 2023-08-12 VITALS — BP 110/59 | HR 67 | Temp 98.2°F | Resp 20

## 2023-08-12 DIAGNOSIS — J069 Acute upper respiratory infection, unspecified: Secondary | ICD-10-CM

## 2023-08-12 LAB — POC SARS CORONAVIRUS 2 AG -  ED: SARS Coronavirus 2 Ag: NEGATIVE

## 2023-08-12 MED ORDER — BENZONATATE 100 MG PO CAPS: 100.0000 mg | ORAL_CAPSULE | Freq: Three times a day (TID) | ORAL | 0 refills | Status: DC | PRN

## 2023-08-12 MED ORDER — KETOROLAC TROMETHAMINE 30 MG/ML IJ SOLN
30.0000 mg | Freq: Once | INTRAMUSCULAR | Status: AC
Start: 2023-08-12 — End: 2023-08-12
  Administered 2023-08-12: 30 mg via INTRAMUSCULAR

## 2023-08-12 MED ORDER — KETOROLAC TROMETHAMINE 10 MG PO TABS: 10.0000 mg | ORAL_TABLET | Freq: Four times a day (QID) | ORAL | 0 refills | Status: DC | PRN

## 2023-08-12 NOTE — ED Provider Notes (Signed)
 [facility]    CSN: [id number] Arrival date & time: 08/12/23  1444      History   Chief Complaint Chief Complaint  Patient presents with   Cough    sore throat, pain in chest when I cough - Entered by patient    HPI Jordan [name] is a 43 y.o. female.    Cough Here for cough and rhinorrhea, sore throat, and hoarseness.  She has had some soreness in her anterior chest when she coughs. No fever or chills and no nausea or vomiting or diarrhea.  NKDA  She does have a history of asthma.   Past Medical History:  Diagnosis Date   Alcohol abuse    Allergic rhinitis    pt takes flonase  for episodes, no episodes in 2012   Anemia    Anxiety    Basal cell carcinoma    recurrent in left maxillary, has had 5 surguries, last one 2003   Basal cell nevus syndrome    dx age 9   Bipolar 1 disorder (HCC)    BPPV (benign paroxysmal positional vertigo) 03/01/2016   Candidiasis, vagina    recurrent, pt has made lifestyle modifications and none recently (as of 8/12)   Cervicitis 03/19/2019   Cyst of nasal sinus    Depression    B-Polar   Fatigue 08/11/2020   Fibroma    left ovarian   Fibromyalgia    Gardnerella infection    + in 08/08   GERD (gastroesophageal reflux disease)    occ   Hematoma, subungual, finger, right, initial encounter 01/23/2021   Herniated disc    Hyperlipidemia    Joint pain    Lactose intolerance    Obesity    Ovarian cyst 08/23/2020   OVARIAN CYSTECTOMY, HX OF 03/01/2006   ovarian fibroma-left 1996, L ovary and fallopian tube removed     Paronychia of third finger of left hand 07/28/2010   PLANTAR FASCIITIS, BILATERAL 05/24/2009   Pre-diabetes    PTSD (post-traumatic stress disorder)    Vaginitis 05/06/2015   [place]    hx of herpetic requiring I+D,( was bitten by autistic child that cares fr    Patient Active Problem List   Diagnosis Date Noted   Polydipsia 06/18/2023   Breast mass, right 03/14/2022   Reactive airway  disease 11/29/2021   Seasonal allergic conjunctivitis 05/31/2021   Venous insufficiency 05/31/2021   Preventative health care 10/03/2020   Fibroids, submucosal 08/23/2020   Fatigue 08/11/2020   Lipoma of skin and subcutaneous tissue of neck 08/11/2020   Hyperlipidemia 05/23/2020   Iron  deficiency 10/22/2019   Menorrhagia with regular cycle 02/09/2019   Right knee pain 11/30/2016   Post traumatic stress disorder (PTSD) 11/09/2015   Bipolar disorder (HCC) 07/04/2015   Vaginal discharge 03/27/2011   Fibromyalgia 06/26/2010   Obesity 01/17/2010    Class: Chronic   Breast pain 07/12/2008   Lumbar disc herniation of L5-S1 on the left side (MRI 2012 s/p surgery)  02/26/2008   Gastroesophageal reflux disease 03/04/2006   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    Past Surgical History:  Procedure Laterality Date   CHOLECYSTECTOMY  1999   colonscopy  07/2020   f/u in 5 yrs   HYSTEROSCOPY N/A 10/04/2020   Procedure: DIAGNOSTIC HYSTEROSCOPY;  Surgeon: Zina Jerilynn LABOR, MD;  Location: [facility];  Service: Gynecology;  Laterality: N/A;   LEFT OOPHORECTOMY     age 39   LIPOMA EXCISION Left 03/02/2021   Procedure:  MiINOR EXCISION LIPOMA;  Surgeon: Tanda Locus, MD;  Location: [facility];  Service: General;  Laterality: Left;   LUMBAR LAMINECTOMY/DECOMPRESSION MICRODISCECTOMY N/A 08/14/2012   Procedure: LUMBAR LAMINECTOMY/DECOMPRESSION MICRODISCECTOMY Lumbar 5 -sacrum 1 decompression;  Surgeon: Oneil Rodgers Priestly, MD;  Location: [facility];  Service: Orthopedics;  Laterality: N/A;  Lumbar 5 -sacrum 1 decompression   MANDIBLE RECONSTRUCTION  1992   upper anfd lower jaw cyst   NASAL SINUS SURGERY     X 2 at age 65 & 41    OB History     Gravida  0   Para  0   Term  0   Preterm  0   AB  0   Living  0      SAB  0   IAB  0   Ectopic  0   Multiple  0   Live Births               Home Medications    Prior to Admission medications    Medication Sig Start Date End Date Taking? Authorizing Provider  benzonatate  (TESSALON ) 100 MG capsule Take 1 capsule (100 mg total) by mouth 3 (three) times daily as needed for cough. 08/12/23  Yes Vonna Sharlet POUR, MD  ketorolac  (TORADOL ) 10 MG tablet Take 1 tablet (10 mg total) by mouth every 6 (six) hours as needed (pain). 08/12/23  Yes Vonna Sharlet POUR, MD  diclofenac  Sodium (VOLTAREN ) 1 % GEL APPLY 4 G TOPICALLY 4 TIMES DAILY 08/09/20   Guilloud, Carolyn, MD  divalproex  (DEPAKOTE  ER) 500 MG 24 hr tablet Take 2 tablets (1,000 mg total) by mouth daily. 06/21/23 09/19/23  Arfeen, Leni DASEN, MD  fluticasone  (FLONASE ) 50 MCG/ACT nasal spray Place 1 spray into both nostrils daily. 07/08/23   Francesco Elsie NOVAK, MD  hydrOXYzine  (ATARAX ) 10 MG tablet Take 2-3 tablets (20-30 mg total) by mouth daily as needed for anxiety. 06/21/23   Arfeen, Leni DASEN, MD  Multiple Vitamins-Minerals (ADULT ONE DAILY GUMMIES PO) Take 2 tablets by mouth daily.    [provider]  pregabalin  (LYRICA ) 150 MG capsule Take 1 capsule (150 mg total) by mouth 2 (two) times daily. 07/16/23   Rosan Dayton BROCKS, DO    Family History Family History  Problem Relation Age of Onset   Bipolar disorder Mother    Alcohol abuse Mother    Drug abuse Mother    Other Mother        DDD   Hypertension Mother    Hyperlipidemia Mother    Depression Mother    Anxiety disorder Mother    Alcoholism Mother    Colon polyps Mother    Bipolar disorder Father    Other Father        basal cell nevus syndrome   Depression Father    Drug abuse Sister    Colon polyps Sister    Drug abuse Brother    Other Brother        basal cell syndrome   Breast cancer Maternal Aunt    Breast cancer Maternal Aunt    Diabetes Maternal Grandmother    Heart attack Maternal Grandmother    Cancer Maternal Grandmother        stomach   Stomach cancer Other        uncle   Cancer Other        unknown grandfather   Diabetes Other        grandmother, aunt  and 2 uncles  Colon cancer Other        cousin at age 30    Social History Social History   Tobacco Use   Smoking status: Never   Smokeless tobacco: Never  Vaping Use   Vaping status: Never Used  Substance Use Topics   Alcohol use: Yes    Alcohol/week: 0.0 standard drinks of alcohol    Comment: socially    Drug use: No     Allergies   Patient has no known allergies.   Review of Systems Review of Systems  Respiratory:  Positive for cough.      Physical Exam Triage Vital Signs ED Triage Vitals  Encounter Vitals Group     BP 08/12/23 1459 (!) 110/59     Girls Systolic BP Percentile --      Girls Diastolic BP Percentile --      Boys Systolic BP Percentile --      Boys Diastolic BP Percentile --      Pulse Rate 08/12/23 1459 67     Resp 08/12/23 1459 20     Temp 08/12/23 1459 98.2 F (36.8 C)     Temp Source 08/12/23 1459 Oral     SpO2 08/12/23 1459 97 %     Weight --      Height --      Head Circumference --      Peak Flow --      Pain Score 08/12/23 1501 5     Pain Loc --      Pain Education --      Exclude from Growth Chart --    No data found.  Updated Vital Signs BP (!) 110/59 (BP Location: Right Arm)   Pulse 67   Temp 98.2 F (36.8 C) (Oral)   Resp 20   LMP 08/06/2023 Comment: end date  SpO2 97%   Visual Acuity Right Eye Distance:   Left Eye Distance:   Bilateral Distance:    Right Eye Near:   Left Eye Near:    Bilateral Near:     Physical Exam Vitals reviewed.  Constitutional:      General: She is not in acute distress.    Appearance: She is not toxic-appearing.  HENT:     Right Ear: Tympanic membrane and ear canal normal.     Left Ear: Tympanic membrane and ear canal normal.     Nose: Congestion present.     Mouth/Throat:     Mouth: Mucous membranes are moist.     Comments: There is no erythema of the oropharynx.  There is some white and clear mucus draining  Eyes:     Extraocular Movements: Extraocular movements intact.      Conjunctiva/sclera: Conjunctivae normal.     Pupils: Pupils are equal, round, and reactive to light.    Cardiovascular:     Rate and Rhythm: Normal rate and regular rhythm.     Heart sounds: No murmur heard. Pulmonary:     Effort: No respiratory distress.     Breath sounds: No stridor. No wheezing, rhonchi or rales.   Musculoskeletal:     Cervical back: Neck supple.  Lymphadenopathy:     Cervical: No cervical adenopathy.   Skin:    Capillary Refill: Capillary refill takes less than 2 seconds.     Coloration: Skin is not jaundiced or pale.   Neurological:     General: No focal deficit present.     Mental Status: She is alert and oriented to person, place,  and time.   Psychiatric:        Behavior: Behavior normal.      UC Treatments / Results  Labs (all labs ordered are listed, but only abnormal results are displayed) Labs Reviewed  POC SARS CORONAVIRUS 2 AG -  ED    EKG   Radiology No results found.  Procedures Procedures (including critical care time)  Medications Ordered in UC Medications  ketorolac  (TORADOL ) 30 MG/ML injection 30 mg (has no administration in time range)    Initial Impression / Assessment and Plan / UC Course  I have reviewed the triage vital signs and the nursing notes.  Pertinent labs & imaging results that were available during my care of the patient were reviewed by me and considered in my medical decision making (see chart for details).     COVID test is negative.  Toradol  injection is given here and Toradol  tablets are sent to the pharmacy.  Tessalon  Perles are sent in for the cough. Final Clinical Impressions(s) / UC Diagnoses   Final diagnoses:  Viral URI     Discharge Instructions      COVID test was negative.  You have been given a shot of Toradol  30 mg today.  Ketorolac  10 mg tablets--take 1 tablet every 6 hours as needed for pain.  This is the same medicine that is in the shot we just gave you  Take  benzonatate  100 mg, 1 tab every 8 hours as needed for cough.  Make sure you are getting in plenty of fluids.     ED Prescriptions     Medication Sig Dispense Auth. Provider   benzonatate  (TESSALON ) 100 MG capsule Take 1 capsule (100 mg total) by mouth 3 (three) times daily as needed for cough. 21 capsule Vonna Sharlet POUR, MD   ketorolac  (TORADOL ) 10 MG tablet Take 1 tablet (10 mg total) by mouth every 6 (six) hours as needed (pain). 20 tablet Yara Tomkinson K, MD      PDMP not reviewed this encounter.   Vonna Sharlet POUR, MD 08/12/23 1600

## 2023-08-12 NOTE — Discharge Instructions (Signed)
 COVID test was negative.  You have been given a shot of Toradol  30 mg today.  Ketorolac  10 mg tablets--take 1 tablet every 6 hours as needed for pain.  This is the same medicine that is in the shot we just gave you  Take benzonatate  100 mg, 1 tab every 8 hours as needed for cough.  Make sure you are getting in plenty of fluids.

## 2023-08-12 NOTE — ED Triage Notes (Signed)
 Pt reports she has a cough, sinus headache, chest discomfort, and sore throat x 3 days     Took robutusiin and mucinex

## 2023-08-12 NOTE — Progress Notes (Signed)
   Referring Provider Forest Coy, MD [address] [place],  [place] [address]   CC:  Chief Complaint  Patient presents with   Follow-up      Jordan [name] is an 43 y.o. female.  HPI: Patient is a 43 y.o. year old female here for follow up after completing weight loss prior to consideration for breast reduction surgery.  She was seen for initial consult by Dr. Waddell 09/2022.  At that time, complained of chronic upper back and neck discomfort in the context of large breasts.  She also was undergoing evaluation of palpable mass on right breast felt to be fibroadenoma.  Plan for repeat imaging 03/2023.  STN 37 cm on the right and 34 cm on the left.  Estimated tissue to be removed at time of surgery close 1000 g in the right and 800 g in the left.  Follow-up after imaging in 2025.   Patient then followed up with our clinic 04/15/2023 at which time recent mammogram revealed stable, benign-appearing mass at 3:00 region right breast.  BI-RADS Category 2.  She had been focusing on weight loss, but was hindered by right knee injury.  Patient weighed 278 pounds at that time.  She understood that minimum BMI requirement was less than 40 kg/m.  Plan was for her to follow-up in 3 to 4 months for weight check.  If she is closer to 250 pounds, can likely proceed with submitting for insurance authorization.  Patient was motivated.   Today, patient is doing okay.  Unfortunately, she recently lost her cousin with whom she was close which has been understandably challenging for her.  Additionally, she recently sprained her left ankle for which she is seeing [facility].  She is seeing physical therapy and working towards weightbearing exercises.  Patient is navigating an ongoing right knee injury as well for which she is receiving gel injections and consideration for possible arthroscopic minimally invasive surgery if she fails to have improvement.  All that being said, she has not been able to  reach her target weight, but remains fiercely motivated.  She feels confident that 250 pounds is still her number and plans to reconnect with her personal trainer when she is cleared from a physical therapy standpoint.   Today's Vitals   08/13/23 0836  BP: 124/78  Pulse: 80  SpO2: 96%  Weight: 286 lb 9.6 oz (130 kg)  Height: 5' 9 (1.753 m)   Body mass index is 42.32 kg/m.   Review of Systems General: Denies fevers MSK: Endorses ongoing back and neck discomfort  Physical Exam    08/13/2023    8:36 AM 08/12/2023    2:59 PM 06/21/2023   11:17 AM  Vitals with BMI  Height 5' 9    Weight 286 lbs 10 oz    BMI 42.3    Systolic 124 110   Diastolic 78 59   Pulse 80 67      Information is confidential and restricted. Go to Review Flowsheets to unlock data.    General:  No acute distress,  Alert and oriented, Non-Toxic, Normal speech and affect Psych: Normal behavior and mood Respiratory: No increased WOB MSK: Ambulatory  Assessment/Plan  Macromastia, obesity:  Patient remains interested in bilateral breast reduction surgery, but understands that continued weight loss will be a necessity prior to consideration.  Follow-up in 3 months for reevaluation.   [name] 08/13/2023, 8:54 AM

## 2023-08-13 ENCOUNTER — Ambulatory Visit (INDEPENDENT_AMBULATORY_CARE_PROVIDER_SITE_OTHER): Payer: 59 | Admitting: Physician Assistant

## 2023-08-13 ENCOUNTER — Encounter: Payer: Self-pay | Admitting: Physician Assistant

## 2023-08-13 VITALS — BP 124/78 | HR 80 | Ht 69.0 in | Wt 286.6 lb

## 2023-08-13 DIAGNOSIS — N62 Hypertrophy of breast: Secondary | ICD-10-CM | POA: Diagnosis not present

## 2023-08-15 ENCOUNTER — Ambulatory Visit (INDEPENDENT_AMBULATORY_CARE_PROVIDER_SITE_OTHER): Admitting: Licensed Clinical Social Worker

## 2023-08-15 DIAGNOSIS — F3131 Bipolar disorder, current episode depressed, mild: Secondary | ICD-10-CM

## 2023-08-18 ENCOUNTER — Encounter (HOSPITAL_COMMUNITY): Payer: Self-pay | Admitting: Licensed Clinical Social Worker

## 2023-08-18 NOTE — Progress Notes (Signed)
 Virtual Visit via Video Note  I connected with Jordan [name] on 08/15/23 at  9:00 AM EDT by a video enabled telemedicine application and verified that I am speaking with the correct person using two identifiers.  Location: Patient: home Provider: home office   I discussed the limitations of evaluation and management by telemedicine and the availability of in person appointments. The patient expressed understanding and agreed to proceed.   I discussed the assessment and treatment plan with the patient. The patient was provided an opportunity to ask questions and all were answered. The patient agreed with the plan and demonstrated an understanding of the instructions.   The patient was advised to call back or seek an in-person evaluation if the symptoms worsen or if the condition fails to improve as anticipated.  I provided 45 minutes of non-face-to-face time during this encounter.   Jordan JONELLE Rosser, LCSW   THERAPIST PROGRESS NOTE  Session Time: 9:00am-9:45am  Participation Level: Active  Behavioral Response: CasualLethargicDepressed  Type of Therapy: Individual Therapy  Treatment Goals addressed:  Reduce frequency, intensity, and duration of depression symptoms so that daily functioning is improved   ProgressTowards Goals: Progressing  Interventions: Motivational Interviewing  Summary: Jordan [name] is a 43 y.o. female who presents with Bipolar I, depressed, mild.   Suicidal/Homicidal: Nowithout intent/plan  Therapist Response: Jordan [name] engaged well in individual virtual session with clinician. Clinician utilized MI OARS to reflect and summarize thoughts, feelings, and interactions. Clinician identified that Jordan [name] is suffering from an upper respiratory infection since last week, so this has added to depression and irritability. Clinician explored the care given by her partner. Clinician also noted that the messages are very mixed regarding wanting her to move out and  wanting her to stay and be better. Clinician processed Jordan [name] need to be loved and her willingness to let go of people who are not showing her the love she needs.   Plan: Return again in 2 weeks.  Diagnosis: Bipolar 1 disorder, depressed, mild (HCC)  Collaboration of Care: Patient refused AEB none required  Patient/Guardian was advised Release of Information must be obtained prior to any record release in order to collaborate their care with an outside provider. Patient/Guardian was advised if they have not already done so to contact the registration department to sign all necessary forms in order for us  to release information regarding their care.   Consent: Patient/Guardian gives verbal consent for treatment and assignment of benefits for services provided during this visit. Patient/Guardian expressed understanding and agreed to proceed.   Jordan JONELLE [place], LCSW 08/18/2023

## 2023-08-20 DIAGNOSIS — M25572 Pain in left ankle and joints of left foot: Secondary | ICD-10-CM | POA: Diagnosis not present

## 2023-08-22 DIAGNOSIS — M545 Low back pain, unspecified: Secondary | ICD-10-CM | POA: Diagnosis not present

## 2023-08-28 ENCOUNTER — Ambulatory Visit

## 2023-08-28 VITALS — Ht 69.0 in | Wt 280.0 lb

## 2023-08-28 DIAGNOSIS — Z0000 Encounter for general adult medical examination without abnormal findings: Secondary | ICD-10-CM

## 2023-08-28 NOTE — Patient Instructions (Signed)
 Jordan [name] , Thank you for taking time out of your busy schedule to complete your Annual Wellness Visit with me. I enjoyed our conversation and look forward to speaking with you again next year. I, as well as your care team,  appreciate your ongoing commitment to your health goals. Please review the following plan we discussed and let me know if I can assist you in the future. Your Game plan/ To Do List    Referrals: If you haven't heard from the office you've been referred to, please reach out to them at the phone provided.   Follow up Visits: Next Medicare AWV with our clinical staff: 09/02/2024 at 1:40 pm phone visit with Nurse Roz   Have you seen your provider in the last 6 months (3 months if uncontrolled diabetes)? Yes Next Office Visit with your provider: Patient will call to schedule appointment with pcp  Clinician Recommendations:  Aim for 30 minutes of exercise or brisk walking, 6-8 glasses of water , and 5 servings of fruits and vegetables each day.       This is a list of the screening recommended for you and due dates:  Health Maintenance  Topic Date Due   Pneumococcal Vaccination (1 of 2 - PCV) Never done   Hepatitis B Vaccine (1 of 3 - 19+ 3-dose series) Never done   HPV Vaccine (1 - Risk 3-dose SCDM series) Never done   COVID-19 Vaccine (4 - 2024-25 season) 10/21/2022   Flu Shot  09/20/2023   Medicare Annual Wellness Visit  08/27/2024   DTaP/Tdap/Td vaccine (2 - Td or Tdap) 10/31/2024   Colon Cancer Screening  07/12/2025   Pap with HPV screening  12/28/2026   Lipid (cholesterol) test  06/16/2028   Hepatitis C Screening  Completed   HIV Screening  Completed   Meningitis B Vaccine  Aged Out    Advanced directives: (Declined) Advance directive discussed with you today. Even though you declined this today, please call our office should you change your mind, and we can give you the proper paperwork for you to fill out. Advance Care Planning is important because  it:  [x]  Makes sure you receive the medical care that is consistent with your values, goals, and preferences  [x]  It provides guidance to your family and loved ones and reduces their decisional burden about whether or not they are making the right decisions based on your wishes.  Follow the link provided in your after visit summary or read over the paperwork we have mailed to you to help you started getting your Advance Directives in place. If you need assistance in completing these, please reach out to us  so that we can help you!  See attachments for Preventive Care and Fall Prevention Tips.

## 2023-08-28 NOTE — Progress Notes (Signed)
 Because this visit was a virtual/telehealth visit,  certain criteria was not obtained, such a blood pressure, CBG if applicable, and timed get up and go. Any medications not marked as taking were not mentioned during the medication reconciliation part of the visit. Any vitals not documented were not able to be obtained due to this being a telehealth visit or patient was unable to self-report a recent blood pressure reading due to a lack of equipment at home via telehealth. Vitals that have been documented are verbally provided by the patient.   Subjective:   Jordan [name] is a 43 y.o. who presents for a Medicare Wellness preventive visit.  As a reminder, Annual Wellness Visits don't include a physical exam, and some assessments may be limited, especially if this visit is performed virtually. We may recommend an in-person follow-up visit with your provider if needed.  Visit Complete: Virtual I connected with  [name] on 08/28/23 by a audio enabled telemedicine application and verified that I am speaking with the correct person using two identifiers.  Patient Location: Home  Provider Location: Home Office  I discussed the limitations of evaluation and management by telemedicine. The patient expressed understanding and agreed to proceed.  Vital Signs: Because this visit was a virtual/telehealth visit, some criteria may be missing or patient reported. Any vitals not documented were not able to be obtained and vitals that have been documented are patient reported.  VideoDeclined- This patient declined Librarian, academic. Therefore the visit was completed with audio only.  Persons Participating in Visit: Patient.  AWV Questionnaire: No: Patient Medicare AWV questionnaire was not completed prior to this visit.        Objective:    Today's Vitals   08/28/23 1543  Weight: 280 lb (127 kg)  Height: 5' 9 (1.753 m)  PainSc: 4   PainLoc: Generalized    Body mass index is 41.35 kg/m.     08/28/2023    3:52 PM 02/11/2023    8:15 AM 08/01/2022    8:20 AM 07/25/2022    9:18 AM 03/14/2022    9:19 AM 12/27/2021   10:50 AM 11/29/2021   10:19 AM  Advanced Directives  Does Patient Have a Medical Advance Directive? No No No No No No No  Would patient like information on creating a medical advance directive? No - Patient declined No - Patient declined  No - Patient declined No - Patient declined No - Patient declined No - Patient declined    Current Medications (verified) Outpatient Encounter Medications as of 08/28/2023  Medication Sig   benzonatate  (TESSALON ) 100 MG capsule Take 1 capsule (100 mg total) by mouth 3 (three) times daily as needed for cough.   diclofenac  Sodium (VOLTAREN ) 1 % GEL APPLY 4 G TOPICALLY 4 TIMES DAILY   divalproex  (DEPAKOTE  ER) 500 MG 24 hr tablet Take 2 tablets (1,000 mg total) by mouth daily.   fluticasone  (FLONASE ) 50 MCG/ACT nasal spray Place 1 spray into both nostrils daily.   hydrOXYzine  (ATARAX ) 10 MG tablet Take 2-3 tablets (20-30 mg total) by mouth daily as needed for anxiety.   ketorolac  (TORADOL ) 10 MG tablet Take 1 tablet (10 mg total) by mouth every 6 (six) hours as needed (pain).   Multiple Vitamins-Minerals (ADULT ONE DAILY GUMMIES PO) Take 2 tablets by mouth daily.   pregabalin  (LYRICA ) 150 MG capsule Take 1 capsule (150 mg total) by mouth 2 (two) times daily.   No facility-administered encounter medications on file  as of 08/28/2023.    Allergies (verified) Patient has no known allergies.   History: Past Medical History:  Diagnosis Date   Alcohol abuse    Allergic rhinitis    pt takes flonase  for episodes, no episodes in 2012   Anemia    Anxiety    Basal cell carcinoma    recurrent in left maxillary, has had 5 surguries, last one 2003   Basal cell nevus syndrome    dx age 55   Bipolar 1 disorder (HCC)    BPPV (benign paroxysmal positional vertigo) 03/01/2016   Candidiasis, vagina     recurrent, pt has made lifestyle modifications and none recently (as of 8/12)   Cervicitis 03/19/2019   Cyst of nasal sinus    Depression    B-Polar   Fatigue 08/11/2020   Fibroma    left ovarian   Fibromyalgia    Gardnerella infection    + in 08/08   GERD (gastroesophageal reflux disease)    occ   Hematoma, subungual, finger, right, initial encounter 01/23/2021   Herniated disc    Hyperlipidemia    Joint pain    Lactose intolerance    Obesity    Ovarian cyst 08/23/2020   OVARIAN CYSTECTOMY, HX OF 03/01/2006   ovarian fibroma-left 1996, L ovary and fallopian tube removed     Paronychia of third finger of left hand 07/28/2010   PLANTAR FASCIITIS, BILATERAL 05/24/2009   Pre-diabetes    PTSD (post-traumatic stress disorder)    Vaginitis 05/06/2015   [place]    hx of herpetic requiring I+D,( was bitten by autistic child that cares fr   Past Surgical History:  Procedure Laterality Date   CHOLECYSTECTOMY  1999   colonscopy  07/2020   f/u in 5 yrs   HYSTEROSCOPY N/A 10/04/2020   Procedure: DIAGNOSTIC HYSTEROSCOPY;  Surgeon: Zina Jerilynn LABOR, MD;  Location[facility];  Service: Gynecology;  Laterality: N/A;   LEFT OOPHORECTOMY     age 62   LIPOMA EXCISION Left 03/02/2021   Procedure: FpPWNM EXCISION LIPOMA;  Surgeon: Tanda Locus, MD;  Location: [facility];  Service: General;  Laterality: Left;   LUMBAR LAMINECTOMY/DECOMPRESSION MICRODISCECTOMY N/A 08/14/2012   Procedure: LUMBAR LAMINECTOMY/DECOMPRESSION MICRODISCECTOMY Lumbar 5 -sacrum 1 decompression;  Surgeon: Oneil Rodgers Priestly, MD;  Location: [facility] OR;  Service: Orthopedics;  Laterality: N/A;  Lumbar 5 -sacrum 1 decompression   MANDIBLE RECONSTRUCTION  1992   upper anfd lower jaw cyst   NASAL SINUS SURGERY     X 2 at age 58 & 68   Family History  Problem Relation Age of Onset   Bipolar disorder Mother    Alcohol abuse Mother    Drug abuse Mother    Other Mother        DDD    Hypertension Mother    Hyperlipidemia Mother    Depression Mother    Anxiety disorder Mother    Alcoholism Mother    Colon polyps Mother    Bipolar disorder Father    Other Father        basal cell nevus syndrome   Depression Father    Drug abuse Sister    Colon polyps Sister    Drug abuse Brother    Other Brother        basal cell syndrome   Breast cancer Maternal Aunt    Breast cancer Maternal Aunt    Diabetes Maternal Grandmother    Heart attack Maternal Grandmother    Cancer  Maternal Grandmother        stomach   Stomach cancer Other        uncle   Cancer Other        unknown grandfather   Diabetes Other        grandmother, aunt and 2 uncles   Colon cancer Other        cousin at age 41   Social History   Socioeconomic History   Marital status: Single    Spouse name: Not on file   Number of children: 0   Years of education: 26   Highest education level: Some college, no degree  Occupational History   Occupation: disabled    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 There are 4 steps up to the entrance the patient uses. There is a railing.      Patient's method of transportation is personal car.      The highest level of education was some college.      The patient currently disabled but working part time       Identified important Relationships are 2 nieces and my best friend       Pets : 1 dog, yorkie named Photographer / Fun: Umm I don't know I am trying to find some things       Current Stressors: finances and trying to keep this weight off       Religious / Personal Beliefs: Christianity       Social Drivers of Corporate investment banker Strain: Low Risk  (08/01/2022)   Overall Financial Resource Strain (CARDIA)    Difficulty of Paying Living Expenses: Not hard at all  Food Insecurity: No Food Insecurity (08/01/2022)   Hunger Vital Sign    Worried About Running Out of Food in the Last Year: Never true    Ran Out of Food in the Last Year: Never true  Transportation Needs: No Transportation Needs (08/01/2022)   PRAPARE - Administrator, Civil Service (Medical): No    Lack of Transportation (Non-Medical): No  Physical Activity: Insufficiently Active (08/01/2022)   Exercise Vital Sign    Days of Exercise per Week: 3 days    Minutes of Exercise per Session: 30 min  Stress: No Stress Concern Present (08/01/2022)   [employer] of Occupational Health - Occupational Stress Questionnaire    Feeling of Stress : Not at all  Social Connections: Moderately Integrated (12/27/2021)   Social Connection and Isolation Panel    Frequency of Communication with Friends and Family: Twice a week    Frequency of Social Gatherings with Friends and Family: Once a week    Attends Religious Services: More than 4 times per year    Active Member of [employer] or Organizations: Yes    Attends Banker Meetings: More than 4 times per year  Marital Status: Never married    Tobacco Counseling Counseling given: Not Answered    Clinical Intake:     Pain Score: 4  Pain Location: Generalized Pain Descriptors / Indicators: Aching, Constant, Discomfort     BMI - recorded: 41.35 Nutritional Status: BMI > 30  Obese Diabetes: No  Lab Results  Component Value Date   HGBA1C 5.7 (A) 06/17/2023   HGBA1C 5.6 03/14/2022   HGBA1C 5.4 08/10/2020     How often do you need to have someone help you when you read instructions, pamphlets, or other  written materials from your doctor or pharmacy?: 1 - Never  Interpreter Needed?: No  Information entered by :: ROZ BROCKS Babetta Paterson, LPN   Activities of Daily Living     08/28/2023    3:52 PM 02/11/2023    8:14 AM  In your present state of health, do you have any difficulty performing the following activities:  Hearing? 0 0  Vision? 0 0  Difficulty concentrating or making decisions? 0 0  Comment BSE: games, reading, puzzles   Walking or climbing stairs? 0 1  Dressing or bathing? 0 0  Doing errands, shopping? 0 0  Preparing Food and eating ? N   Using the Toilet? N   In the past six months, have you accidently leaked urine? N   Do you have problems with loss of bowel control? N   Managing your Medications? N   Managing your Finances? N   Housekeeping or managing your Housekeeping? N     Patient Care Team: Leontine Lapine, MD as PCP - General Plyler, Arland HERO, RD as Dietitian (Dietician) Regal, Pasco RAMAN, DPM as Consulting Physician (Podiatry) Key, Hargis HERO, NP as Nurse Practitioner (Gynecology) Curry Leni DASEN, MD as Consulting Physician (Psychiatry) Bret Harlene SAUNDERS, LCSW as Counselor (Licensed Clinical Social Worker) Beuford Anes, MD as Consulting Physician (Orthopedic Surgery) [place], My [place], [place] as Referring Physician (Optometry)  I have updated your Care Teams any recent Medical Services you may have received from other providers in the past year.     Assessment:   This is a routine wellness examination for Sharlynn.  Hearing/Vision screen Hearing Screening - Comments:: Denies hearing difficulties.  Vision Screening - Comments:: Wears rx glasses - up to date with routine eye exams with My China Le, OD.    Goals Addressed             This Visit's Progress    08/28/2023: To increase my gym days to 4 times per week.         Depression Screen     08/28/2023    3:55 PM 03/18/2023    4:48 PM 02/11/2023    8:14 AM 08/01/2022    8:21 AM 07/25/2022    9:18 AM 03/14/2022     9:10 AM 12/27/2021   10:50 AM  PHQ 2/9 Scores  PHQ - 2 Score 0 0 0 0 0 0 0  PHQ- 9 Score 0 5         Fall Risk     08/28/2023    3:51 PM 03/18/2023    2:25 PM 02/11/2023    8:14 AM 08/01/2022    8:21 AM 07/25/2022    9:17 AM  Fall Risk   Falls in the past year? 0 0 0 0 0  Number falls in past yr: 0 0 0 0 0  Injury with Fall? 0 0 0 0 0  Risk for fall due to : No Fall Risks No  Fall Risks No Fall Risks Medication side effect No Fall Risks  Follow up Falls evaluation completed Falls evaluation completed Falls evaluation completed;Falls prevention discussed Falls prevention discussed;Education provided;Falls evaluation completed Falls evaluation completed;Falls prevention discussed    MEDICARE RISK AT HOME:  Medicare Risk at Home Any stairs in or around the home?: Yes If so, are there any without handrails?: No Home free of loose throw rugs in walkways, pet beds, electrical cords, etc?: Yes Adequate lighting in your home to reduce risk of falls?: Yes Life alert?: No (KEEPS PHONE CLOSE BY) Use of a cane, walker or w/c?: No Grab bars in the bathroom?: Yes Shower chair or bench in shower?: No (NON-SLIP MAT) Elevated toilet seat or a handicapped toilet?: Yes  TIMED UP AND GO:  Was the test performed?  No  Cognitive Function: 6CIT completed    08/28/2023    3:55 PM 11/12/2016    8:10 AM 07/20/2016    9:45 AM  MMSE - Mini Mental State Exam  Not completed: Unable to complete    Orientation to time  5  4   Orientation to Place  5  5   Registration  3  3   Attention/ Calculation  5  1   Recall  3  1   Language- name 2 objects  2  2   Language- repeat  1 0  Language- follow 3 step command  3  3   Language- read & follow direction  1  1   Write a sentence  1  0   Copy design  1  1   Total score  30  21      Data saved with a previous flowsheet row definition        08/01/2022    8:22 AM 09/15/2020    2:04 PM  6CIT Screen  What Year? 0 points 0 points  What month? 0 points 0  points  What time? 0 points 0 points  Count back from 20 0 points 0 points  Months in reverse 4 points 0 points  Repeat phrase 0 points 0 points  Total Score 4 points 0 points    Immunizations Immunization History  Administered Date(s) Administered   Influenza Split 02/02/2011, 12/19/2011   Influenza, Seasonal, Injecte, Preservative Fre 02/11/2023   Influenza,inj,Quad PF,6+ Mos 12/01/2012, 11/06/2013, 11/01/2014, 11/09/2015, 11/30/2016, 12/01/2018, 11/05/2019, 12/08/2020, 12/27/2021   Influenza-Unspecified 12/20/2017   PFIZER Comirnaty (Gray Top)Covid-19 Tri-Sucrose Vaccine 05/23/2020   PFIZER(Purple Top)SARS-COV-2 Vaccination 04/25/2019, 05/16/2019   Tdap 11/01/2014    Screening Tests Health Maintenance  Topic Date Due   Pneumococcal Vaccine 95-15 Years old (1 of 2 - PCV) Never done   Hepatitis B Vaccines (1 of 3 - 19+ 3-dose series) Never done   HPV VACCINES (1 - Risk 3-dose SCDM series) Never done   COVID-19 Vaccine (4 - 2024-25 season) 10/21/2022   INFLUENZA VACCINE  09/20/2023   Medicare Annual Wellness (AWV)  08/27/2024   DTaP/Tdap/Td (2 - Td or Tdap) 10/31/2024   Colonoscopy  07/12/2025   Cervical Cancer Screening (HPV/Pap Cotest)  12/28/2026   LIPID PANEL  06/16/2028   Hepatitis C Screening  Completed   HIV Screening  Completed   Meningococcal B Vaccine  Aged Out    Health Maintenance  Health Maintenance Due  Topic Date Due   Pneumococcal Vaccine 32-47 Years old (1 of 2 - PCV) Never done   Hepatitis B Vaccines (1 of 3 - 19+ 3-dose series) Never done   HPV VACCINES (  1 - Risk 3-dose SCDM series) Never done   COVID-19 Vaccine (4 - 2024-25 season) 10/21/2022   Health Maintenance Items Addressed: Yes Patient aware of current care gaps.  Immunization record was verified by [employer]. Patient is due for the following: Pneumonia, Hepatitis B Series, HPV series and Covid.  Additional Screening:  Vision Screening: Recommended annual ophthalmology exams for early detection  of glaucoma and other disorders of the eye. Would you like a referral to an eye doctor? No    Dental Screening: Recommended annual dental exams for proper oral hygiene  Community Resource Referral / Chronic Care Management: CRR required this visit?  No   CCM required this visit?  No   Plan:    I have personally reviewed and noted the following in the patient's chart:   Medical and social history Use of alcohol, tobacco or illicit drugs  Current medications and supplements including opioid prescriptions. Patient is not currently taking opioid prescriptions. Functional ability and status Nutritional status Physical activity Advanced directives List of other physicians Hospitalizations, surgeries, and ER visits in previous 12 months Vitals Screenings to include cognitive, depression, and falls Referrals and appointments  In addition, I have reviewed and discussed with patient certain preventive protocols, quality metrics, and best practice recommendations. A written personalized care plan for preventive services as well as general preventive health recommendations were provided to patient.   Roz LOISE Fuller, LPN   2/0/7974   After Visit Summary: (MyChart) Due to this being a telephonic visit, the after visit summary with patients personalized plan was offered to patient via MyChart   Notes: Patient aware of current care gaps.  Immunization record was verified by [employer]. Patient is due for the following: Pneumonia, Hepatitis B Series, HPV series and Covid.

## 2023-08-29 DIAGNOSIS — M545 Low back pain, unspecified: Secondary | ICD-10-CM | POA: Diagnosis not present

## 2023-08-30 DIAGNOSIS — Z86018 Personal history of other benign neoplasm: Secondary | ICD-10-CM | POA: Diagnosis not present

## 2023-09-06 DIAGNOSIS — M19072 Primary osteoarthritis, left ankle and foot: Secondary | ICD-10-CM | POA: Diagnosis not present

## 2023-09-15 ENCOUNTER — Other Ambulatory Visit (HOSPITAL_COMMUNITY): Payer: Self-pay | Admitting: Psychiatry

## 2023-09-15 DIAGNOSIS — F3132 Bipolar disorder, current episode depressed, moderate: Secondary | ICD-10-CM

## 2023-09-17 DIAGNOSIS — M545 Low back pain, unspecified: Secondary | ICD-10-CM | POA: Diagnosis not present

## 2023-09-19 ENCOUNTER — Ambulatory Visit (INDEPENDENT_AMBULATORY_CARE_PROVIDER_SITE_OTHER): Admitting: Licensed Clinical Social Worker

## 2023-09-19 ENCOUNTER — Encounter (HOSPITAL_COMMUNITY): Payer: Self-pay | Admitting: Licensed Clinical Social Worker

## 2023-09-19 DIAGNOSIS — F3131 Bipolar disorder, current episode depressed, mild: Secondary | ICD-10-CM

## 2023-09-19 NOTE — Progress Notes (Signed)
 Virtual Visit via Video Note  I connected with Jordan [name] on 09/19/23 at 12:30 PM EDT by a video enabled telemedicine application and verified that I am speaking with the correct person using two identifiers.  Location: Patient: Home Provider: Home office   I discussed the limitations of evaluation and management by telemedicine and the availability of in person appointments. The patient expressed understanding and agreed to proceed.    I discussed the assessment and treatment plan with the patient. The patient was provided an opportunity to ask questions and all were answered. The patient agreed with the plan and demonstrated an understanding of the instructions.   The patient was advised to call back or seek an in-person evaluation if the symptoms worsen or if the condition fails to improve as anticipated.  I provided 45 minutes of non-face-to-face time during this encounter.   Jordan JONELLE Rosser, LCSW   THERAPIST PROGRESS NOTE  Session Time: 12:30pm-1:15pm  Participation Level: Active  Behavioral Response: NeatAlertIrritable  Type of Therapy: Individual Therapy  Treatment Goals addressed: Reduce frequency, intensity, and duration of depression symptoms so that daily functioning is improved   ProgressTowards Goals: Progressing  Interventions: CBT  Summary: Jordan [name] is a 43 y.o. female who presents with Bipolar I Depressed.   Suicidal/Homicidal: Nowithout intent/plan  Therapist Response: Jordan [name] engaged well in individual virtual session with clinician.  Clinician utilized the CBT to process thoughts feelings and interactions.  Clinician discussed updates and relationship with partner.  Jordan [name] shared some improvement in partner's ability to cope with stress, not letting stressful events interfere with fun times.  Clinician discussed Jordan [name] health and noted continuing challenges with her ankle.  Jordan [name] shared she is still wearing a boot on the injured foot.  She  reports she is exercising as much as she can and she participates in physical therapy.  Clinician explored interactions with family members and processed upcoming nieces upcoming move about 40 minutes away.  Clinician reflected some excitement that this move will increase her nieces independence skills, and take some responsibility for them and their wellbeing off of Jordan [name].  Jordan [name] identified concerns about long-term impact of her medication on her memory.  Clinician encouraged Jordan [name] to talk to Dr. Arfeen about a possible change or adjustment in medication.  Clinician identified high levels of stress at the present time and discouraged Jordan [name] from going off of her medication completely.  Plan: Return again in 1 week.  Diagnosis: Bipolar 1 disorder, depressed, mild (HCC)  Collaboration of Care: Psychiatrist AEB Jordan [name] tomorrow.  Patient/Guardian was advised Release of Information must be obtained prior to any record release in order to collaborate their care with an outside provider. Patient/Guardian was advised if they have not already done so to contact the registration department to sign all necessary forms in order for us  to release information regarding their care.   Consent: Patient/Guardian gives verbal consent for treatment and assignment of benefits for services provided during this visit. Patient/Guardian expressed understanding and agreed to proceed.   Jordan JONELLE [place], LCSW 09/19/2023

## 2023-09-20 ENCOUNTER — Encounter (HOSPITAL_COMMUNITY): Payer: Self-pay | Admitting: Psychiatry

## 2023-09-20 ENCOUNTER — Telehealth (HOSPITAL_COMMUNITY): Admitting: Psychiatry

## 2023-09-20 DIAGNOSIS — F4310 Post-traumatic stress disorder, unspecified: Secondary | ICD-10-CM

## 2023-09-20 DIAGNOSIS — F3132 Bipolar disorder, current episode depressed, moderate: Secondary | ICD-10-CM

## 2023-09-20 DIAGNOSIS — F419 Anxiety disorder, unspecified: Secondary | ICD-10-CM

## 2023-09-20 MED ORDER — DIVALPROEX SODIUM ER 500 MG PO TB24: 1000.0000 mg | ORAL_TABLET | Freq: Every day | ORAL | 2 refills | Status: DC

## 2023-09-20 MED ORDER — HYDROXYZINE HCL 10 MG PO TABS: 10.0000 mg | ORAL_TABLET | Freq: Every day | ORAL | 2 refills | Status: DC | PRN
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
## 2023-09-20 NOTE — Progress Notes (Signed)
 Internal Medicine Attending:  I reviewed the AWV findings of the medical professional who conducted the visit. I was present in the office suite and immediately available to provide assistance and direction throughout the time the service was provided.

## 2023-09-24 ENCOUNTER — Ambulatory Visit (INDEPENDENT_AMBULATORY_CARE_PROVIDER_SITE_OTHER): Admitting: Licensed Clinical Social Worker

## 2023-09-24 DIAGNOSIS — F3131 Bipolar disorder, current episode depressed, mild: Secondary | ICD-10-CM | POA: Diagnosis not present

## 2023-09-28 ENCOUNTER — Encounter (HOSPITAL_COMMUNITY): Payer: Self-pay | Admitting: Licensed Clinical Social Worker

## 2023-09-28 NOTE — Progress Notes (Signed)
   THERAPIST PROGRESS NOTE  Session Time: 8:00am-8:50am  Participation Level: Active  Behavioral Response: NeatAlertDepressed and Irritable  Type of Therapy: Individual Therapy  Treatment Goals addressed: Reduce frequency, intensity, and duration of depression symptoms so that daily functioning is improved   ProgressTowards Goals: Not Progressing  Interventions: Motivational Interviewing  Summary: Jordan [name] is a 43 y.o. female who presents with Bipolar I, depressed, mild.   Suicidal/Homicidal: Nowithout intent/plan  Therapist Response: Caliya engaged well in individual and person session with clinician.  Clinician utilized motivational interviewing to reflect and summarized thoughts, feelings, and interactions.  Clinician explored relationship with partner, noting high levels of stress mixed with moderate levels of joy.  Clinician processed thoughts and feelings about trust and ability to share important details about life, experiences, and finances.  Clinician reflected the difficulty maintaining trust and safety in the relationship.  Clinician noted some minor improvements due to partner starting therapy.  However concerns about partner's reaction to stress and misunderstanding of [name]'s experiences in the past continue to challenge the relationship.  Clinician processed coping skills and communication skills.  Plan: Return again in 2 weeks.  Diagnosis: Bipolar 1 disorder, depressed, mild (HCC)  Collaboration of Care: Psychiatrist AEB reviewed last note from Dr. Curry.  Patient/Guardian was advised Release of Information must be obtained prior to any record release in order to collaborate their care with an outside provider. Patient/Guardian was advised if they have not already done so to contact the registration department to sign all necessary forms in order for us  to release information regarding their care.   Consent: Patient/Guardian gives verbal consent for treatment  and assignment of benefits for services provided during this visit. Patient/Guardian expressed understanding and agreed to proceed.   Harlene SAUNDERS [place], LCSW 09/28/2023

## 2023-10-01 ENCOUNTER — Encounter (HOSPITAL_COMMUNITY): Payer: Self-pay | Admitting: Licensed Clinical Social Worker

## 2023-10-01 ENCOUNTER — Ambulatory Visit (HOSPITAL_COMMUNITY): Admitting: Licensed Clinical Social Worker

## 2023-10-01 DIAGNOSIS — F3131 Bipolar disorder, current episode depressed, mild: Secondary | ICD-10-CM

## 2023-10-01 NOTE — Progress Notes (Signed)
 Virtual Visit via Video Note  I connected with MANDIE CRABBE on 10/01/23 at  8:00 AM EDT by a video enabled telemedicine application and verified that I am speaking with the correct person using two identifiers.  Location: Patient: home  Provider: office   I discussed the limitations of evaluation and management by telemedicine and the availability of in person appointments. The patient expressed understanding and agreed to proceed.   I discussed the assessment and treatment plan with the patient. The patient was provided an opportunity to ask questions and all were answered. The patient agreed with the plan and demonstrated an understanding of the instructions.   The patient was advised to call back or seek an in-person evaluation if the symptoms worsen or if the condition fails to improve as anticipated.  I provided 30 minutes of non-face-to-face time during this encounter.   Harlene JONELLE Rosser, LCSW   THERAPIST PROGRESS NOTE  Session Time: 8:10am-8:40am  Participation Level: Active  Behavioral Response: Well GroomedAlertAnxious and Depressed  Type of Therapy: Individual Therapy  Treatment Goals addressed: Reduce frequency, intensity, and duration of depression symptoms so that daily functioning is improved   ProgressTowards Goals: Progressing  Interventions: CBT  Summary: YANCY HASCALL is a 43 y.o. female who presents with Bipolar I, depressed.   Suicidal/Homicidal: Nowithout intent/plan  Therapist Response: Bonnee engaged well in individual virtual session with clinician. Clinician utilized CBT to process thoughts, feelings, and interactions. Clinician explored recent experience with SSD and the news that she would not be able to take another full-time job without the loss of her current benefits. Clinician utilized CBT reality testing, exploring the need for benefits, concerns about health, chronic issues that continue to be monitored and addressed immediately, and  the current list of 9 providers that are a part of her treatment team. Clinician identified concerns that even if full-time work was an option, it would be unclear what would be covered by private insurance. Clinician processed plans for the future and explored ways to return to life on SSD. Clinician encouraged Shamecca to wait before she becomes hopeless about her finances, to continue on her current path with school, and to figure out the future later. Clinician also noted the importance of not making major life decisions under duress.   Plan: Return again in 1-2 weeks.  Diagnosis: Bipolar 1 disorder, depressed, mild (HCC)  Collaboration of Care: Patient refused AEB none required  Patient/Guardian was advised Release of Information must be obtained prior to any record release in order to collaborate their care with an outside provider. Patient/Guardian was advised if they have not already done so to contact the registration department to sign all necessary forms in order for us  to release information regarding their care.   Consent: Patient/Guardian gives verbal consent for treatment and assignment of benefits for services provided during this visit. Patient/Guardian expressed understanding and agreed to proceed.   Harlene JONELLE [place], LCSW 10/01/2023

## 2023-10-04 DIAGNOSIS — M19072 Primary osteoarthritis, left ankle and foot: Secondary | ICD-10-CM | POA: Diagnosis not present

## 2023-10-05 ENCOUNTER — Other Ambulatory Visit: Payer: Self-pay | Admitting: Internal Medicine

## 2023-10-07 NOTE — Telephone Encounter (Signed)
 Medication sent to pharmacy

## 2023-10-08 ENCOUNTER — Encounter (HOSPITAL_COMMUNITY): Payer: Self-pay

## 2023-10-08 ENCOUNTER — Ambulatory Visit (HOSPITAL_COMMUNITY): Admitting: Licensed Clinical Social Worker

## 2023-10-15 ENCOUNTER — Ambulatory Visit (HOSPITAL_COMMUNITY): Admitting: Licensed Clinical Social Worker

## 2023-10-16 ENCOUNTER — Other Ambulatory Visit (HOSPITAL_COMMUNITY): Payer: Self-pay | Admitting: Psychiatry

## 2023-10-16 DIAGNOSIS — F419 Anxiety disorder, unspecified: Secondary | ICD-10-CM

## 2023-10-16 DIAGNOSIS — F3132 Bipolar disorder, current episode depressed, moderate: Secondary | ICD-10-CM

## 2023-10-16 DIAGNOSIS — F4310 Post-traumatic stress disorder, unspecified: Secondary | ICD-10-CM

## 2023-10-18 ENCOUNTER — Other Ambulatory Visit: Payer: Self-pay | Admitting: *Deleted

## 2023-10-18 DIAGNOSIS — M797 Fibromyalgia: Secondary | ICD-10-CM

## 2023-10-23 ENCOUNTER — Telehealth: Payer: Self-pay | Admitting: *Deleted

## 2023-10-23 NOTE — Telephone Encounter (Unsigned)
 Copied from CRM #[id number]. Topic: Clinical - Prescription Issue >> Oct 23, 2023 11:11 AM Carmell SAUNDERS wrote: Reason for CRM: Pt's rx request for pregabalin  (LYRICA ) 150 MG capsule was sent 8/29 and patient has run completely out of the medication. It appears that Dr. Leontine is out of the office. Is there a way to get this approved by a provider that is available? Please follow up.

## 2023-10-24 DIAGNOSIS — M545 Low back pain, unspecified: Secondary | ICD-10-CM | POA: Diagnosis not present

## 2023-10-24 MED ORDER — PREGABALIN 150 MG PO CAPS: 150.0000 mg | ORAL_CAPSULE | Freq: Two times a day (BID) | ORAL | 0 refills | Status: DC

## 2023-10-29 DIAGNOSIS — M545 Low back pain, unspecified: Secondary | ICD-10-CM | POA: Diagnosis not present

## 2023-10-29 NOTE — Telephone Encounter (Signed)
 Copied from CRM #[id number]. Topic: Clinical - Prescription Issue >> Oct 23, 2023 11:11 AM Carmell SAUNDERS wrote: Reason for CRM: Pt's rx request for pregabalin  (LYRICA ) 150 MG capsule was sent 8/29 and patient has run completely out of the medication. It appears that Dr. Leontine is out of the office. Is there a way to get this approved by a provider that is available? Please follow up. >> Oct 29, 2023  2:11 PM Brittney F wrote: Prescription In question: pregabalin  (LYRICA ) 150 MG capsule   The patient is calling back in in reference to this matter stated that the prescription in question is still not being approved by her insurance due to the prescribing provider being a resident doctor and not one of the attending physicians.   Please resubmit the patient's requested prescription by way of an attending provider for the patient's insurance to cover the prescription. Patient has now been without the prescription since .  Please call patient once the prescription has been sent.  Callback Number: [id number]

## 2023-10-29 NOTE — Telephone Encounter (Signed)
 Lyrica  rx was sent to [facility] on 9/4 by Dr Elicia. I called pt but no answer; left message for pt.  I called [facility] who ran rx thru again with Dr Zheng's DEA# - it went thru but they do not have it in stock until tomorrow or the next day. I called pt and left this message on her vm.

## 2023-10-31 ENCOUNTER — Other Ambulatory Visit (HOSPITAL_COMMUNITY)
Admission: RE | Admit: 2023-10-31 | Discharge: 2023-10-31 | Disposition: A | Discharge status: Home / Self Care | Source: Ambulatory Visit

## 2023-10-31 ENCOUNTER — Ambulatory Visit

## 2023-10-31 VITALS — BP 103/62 | HR 68 | Temp 98.2°F | Ht 69.0 in | Wt 278.2 lb

## 2023-10-31 DIAGNOSIS — B9689 Other specified bacterial agents as the cause of diseases classified elsewhere: Secondary | ICD-10-CM | POA: Diagnosis not present

## 2023-10-31 DIAGNOSIS — N760 Acute vaginitis: Secondary | ICD-10-CM | POA: Diagnosis present

## 2023-10-31 DIAGNOSIS — Z8742 Personal history of other diseases of the female genital tract: Secondary | ICD-10-CM

## 2023-10-31 DIAGNOSIS — Z8619 Personal history of other infectious and parasitic diseases: Secondary | ICD-10-CM | POA: Diagnosis not present

## 2023-10-31 DIAGNOSIS — M545 Low back pain, unspecified: Secondary | ICD-10-CM | POA: Diagnosis not present

## 2023-10-31 DIAGNOSIS — N3090 Cystitis, unspecified without hematuria: Secondary | ICD-10-CM | POA: Diagnosis not present

## 2023-10-31 LAB — POCT URINALYSIS DIPSTICK
Bilirubin, UA: NEGATIVE
Blood, UA: NEGATIVE
Glucose, UA: NEGATIVE
Leukocytes, UA: NEGATIVE
Nitrite, UA: NEGATIVE
Protein, UA: NEGATIVE
Spec Grav, UA: 1.02 (ref 1.010–1.025)
Urobilinogen, UA: 0.2 U/dL
pH, UA: 7 (ref 5.0–8.0)

## 2023-10-31 MED ORDER — NITROFURANTOIN MONOHYD MACRO 100 MG PO CAPS: 100.0000 mg | ORAL_CAPSULE | Freq: Two times a day (BID) | ORAL | 0 refills | Status: AC

## 2023-10-31 NOTE — Progress Notes (Signed)
 Internal Medicine Clinic Attending  I was physically present during the key portions of the resident provided service and participated in the medical decision making of patient's management care. I reviewed pertinent patient test results.  The assessment, diagnosis, and plan were formulated together and I agree with the documentation in the resident's note.  Isolated dysuria although normal UA in office today, will send Ucx and preemptively treat for possible UTI given her symptoms are c/w previous UTIs. Will check vaginitis panel as well, pt declines STI testing. Low concern for pyelonephritis despite ?acute on chronic back pain given negative UA. Will recommend hydration in the case of mild nephrolithiasis and follow up results of Ucx and BV/candida testing.  Shawn Sick, MD

## 2023-10-31 NOTE — Progress Notes (Signed)
 mi  Acute Office Visit  Subjective:     Patient ID: Jordan [name], female    DOB: 1980/09/12, 43 y.o.   MRN: [id number]  Chief Complaint  Patient presents with   Dysuria    HPI Patient is in today for irritation while urinating. She states this has been ongoing for about 1 week. She has tried heating pads, TENS unit for her back, and reducing her fluid intake to help mitigate her symptoms. She has a history of UTIs and bacterial vaginosis but has not had any infections this past year. She denies any discharge, hematuria, STD/STIs or history of kidney stones.  Please see problem-based assessment and plan below for details.  Review of Systems  Constitutional:  Negative for chills, fever and malaise/fatigue.  Eyes: Negative.   Respiratory: Negative.    Cardiovascular: Negative.   Gastrointestinal:  Positive for nausea. Negative for abdominal pain, diarrhea and vomiting.  Genitourinary:  Positive for dysuria, flank pain, frequency and urgency. Negative for hematuria.  Musculoskeletal:  Positive for back pain.       Chronic  Neurological: Negative.   Psychiatric/Behavioral: Negative.          Objective:    BP 103/62 (BP Location: Left Arm, Patient Position: Sitting, Cuff Size: Small)   Pulse 68   Temp 98.2 F (36.8 C) (Oral)   Ht 5' 9 (1.753 m)   Wt 278 lb 3.2 oz (126.2 kg)   SpO2 98%   BMI 41.08 kg/m  Physical Exam Constitutional:      Appearance: Normal appearance. She is normal weight.  HENT:     Mouth/Throat:     Mouth: Mucous membranes are moist.  Eyes:     Pupils: Pupils are equal, round, and reactive to light.  Cardiovascular:     Rate and Rhythm: Normal rate and regular rhythm.     Pulses: Normal pulses.     Heart sounds: Normal heart sounds.  Pulmonary:     Effort: Pulmonary effort is normal.     Breath sounds: Normal breath sounds.  Abdominal:     General: Abdomen is flat. Bowel sounds are normal.     Palpations: Abdomen is soft.     Tenderness:  There is no abdominal tenderness.  Musculoskeletal:     Cervical back: Normal range of motion.     Comments: CVA tenderness on left side  Skin:    General: Skin is warm.  Neurological:     General: No focal deficit present.     Mental Status: She is alert and oriented to person, place, and time.  Psychiatric:        Mood and Affect: Mood normal.        Behavior: Behavior normal.     Results for orders placed or performed in visit on 10/31/23  POCT Urinalysis Dipstick (81002)  Result Value Ref Range   Color, UA Yellow    Clarity, UA Clear    Glucose, UA Negative Negative   Bilirubin, UA Negative    Ketones, UA Trace    Spec Grav, UA 1.020 1.010 - 1.025   Blood, UA Negative    pH, UA 7.0 5.0 - 8.0   Protein, UA Negative Negative   Urobilinogen, UA 0.2 0.2 or 1.0 E.U./dL   Nitrite, UA Negative    Leukocytes, UA Negative Negative       Assessment & Plan:   Patient seen with Dr. Jone Dauphin.  Acute uncomplicated cystitis Patient reports ongoing symptoms for the past week.  She endorses dysuria, nocturia, increased frequency, but denies any hematuria, changes in discharge, or STDs/STIs.  She does note a history of UTIs in bacterial vaginosis infections, but has not had any within the past year.  Urine dipstick and UA done in office largely negative, only showing trace ketones.  Given patient's symptoms of nausea and CVA tenderness on physical exam, still concerned for acute cystitis.  Less concern for pyelonephritis given largely negative UA, however will pend urine culture and will send Macrobid  100 mg twice daily for 5 days.  Will have patient self swab to rule out any signs of bacterial vaginosis infection.  If symptoms persist despite Macrobid  use, consider sending in metronidazole . -Macrobid  100 mg twice daily 5 days -UA, urine dipstick, urine culture, cervical vaginal swab   Lamonte Colace, DO Internal Medicine Resident, PGY-1 3:15 PM 10/31/2023

## 2023-10-31 NOTE — Patient Instructions (Addendum)
 Thank you, Ms.Myanna Y Bradburn for allowing us  to provide your care today. Today we discussed your urinary tract infection.    New medications: -Macrobid  100mg , twice daily for 5 days.   I have ordered the following labs for you:  Lab Orders         Culture, Urine         Urinalysis, Reflex Microscopic         POCT Urinalysis Dipstick (18997)       I will call if any are abnormal. All of your labs can be accessed through My Chart.   My Chart Access: [email or web address]     We look forward to seeing you next time. Please call our clinic at [phone] if you have any questions or concerns. The best time to call is Monday-Friday from 9am-4pm, but there is someone available 24/7. If after hours or the weekend, call the main hospital number and ask for the Internal Medicine Resident On-Call. If you need medication refills, please notify your pharmacy one week in advance and they will send us  a request.   Thank you for letting us  take part in your care. Wishing you the best!  Scout Guyett, DO 10/31/2023, 2:01 PM [facility]

## 2023-11-01 LAB — URINALYSIS, ROUTINE W REFLEX MICROSCOPIC
Bilirubin, UA: NEGATIVE
Glucose, UA: NEGATIVE
Leukocytes,UA: NEGATIVE
Nitrite, UA: NEGATIVE
Protein,UA: NEGATIVE
RBC, UA: NEGATIVE
Specific Gravity, UA: 1.023 (ref 1.005–1.030)
Urobilinogen, Ur: 1 mg/dL (ref 0.2–1.0)
pH, UA: 7 (ref 5.0–7.5)

## 2023-11-01 LAB — CERVICOVAGINAL ANCILLARY ONLY
Bacterial Vaginitis (gardnerella): POSITIVE — AB
Comment: NEGATIVE

## 2023-11-04 ENCOUNTER — Ambulatory Visit: Payer: Self-pay | Admitting: Student

## 2023-11-04 DIAGNOSIS — B9689 Other specified bacterial agents as the cause of diseases classified elsewhere: Secondary | ICD-10-CM

## 2023-11-04 MED ORDER — METRONIDAZOLE 500 MG PO TABS: 500.0000 mg | ORAL_TABLET | Freq: Two times a day (BID) | ORAL | 0 refills | Status: DC

## 2023-11-04 NOTE — Progress Notes (Signed)
 Called patient to discuss positive bacterial vaginitis swab.  Most of her symptoms are better after taking Macrobid  which she has 2 more days of.  Discussed treatment for bacterial vaginitis which she would like to get.  She is sexually active and will tell her partner to discuss taking metronidazole  with their primary care provider.  Will treat with 7 days of metronidazole  for 100 mg twice daily for both the patient and her partner.

## 2023-11-05 DIAGNOSIS — M545 Low back pain, unspecified: Secondary | ICD-10-CM | POA: Diagnosis not present

## 2023-11-05 LAB — URINE CULTURE

## 2023-11-07 DIAGNOSIS — M545 Low back pain, unspecified: Secondary | ICD-10-CM | POA: Diagnosis not present

## 2023-11-12 ENCOUNTER — Ambulatory Visit (HOSPITAL_COMMUNITY): Admitting: Licensed Clinical Social Worker

## 2023-11-12 DIAGNOSIS — M545 Low back pain, unspecified: Secondary | ICD-10-CM | POA: Diagnosis not present

## 2023-11-13 ENCOUNTER — Encounter (HOSPITAL_COMMUNITY): Payer: Self-pay | Admitting: Licensed Clinical Social Worker

## 2023-11-13 ENCOUNTER — Ambulatory Visit (INDEPENDENT_AMBULATORY_CARE_PROVIDER_SITE_OTHER): Admitting: Licensed Clinical Social Worker

## 2023-11-13 DIAGNOSIS — F3131 Bipolar disorder, current episode depressed, mild: Secondary | ICD-10-CM

## 2023-11-13 NOTE — Progress Notes (Signed)
   THERAPIST PROGRESS NOTE  Session Time: 2:30pm-3:15pm  Participation Level: Active  Behavioral Response: Well GroomedAlertEuthymic  Type of Therapy: Individual Therapy  Treatment Goals addressed: Reduce frequency, intensity, and duration of depression symptoms so that daily functioning is improved   ProgressTowards Goals: Progressing  Interventions: CBT  Summary: SYANA DEGRAFFENREID is a 43 y.o. female who presents with Bipolar I, depressed.   Suicidal/Homicidal: Nowithout intent/plan  Therapist Response: Angie engaged well in individual in person session with clinician. Clinician utilized CBT to process thoughts, feelings, and interactions. Clinician processed recent interactions and support received from partner. Clinician identified improvement in relationship, understanding from partner, and plans to move into a house together at the end of the year. Clinician explored thoughts and feelings about this decision and identified the importance of maintaining the level of support she has received from partner over the past few months. Dejanae shared thoughts about getting engaged. Clinician encouraged Caylor to take her time and to see how long this change can be maintained.   Plan: Return again in 2-4 weeks.  Diagnosis: Bipolar 1 disorder, depressed, mild (HCC)  Collaboration of Care: Patient refused AEB none required  Patient/Guardian was advised Release of Information must be obtained prior to any record release in order to collaborate their care with an outside provider. Patient/Guardian was advised if they have not already done so to contact the registration department to sign all necessary forms in order for us  to release information regarding their care.   Consent: Patient/Guardian gives verbal consent for treatment and assignment of benefits for services provided during this visit. Patient/Guardian expressed understanding and agreed to proceed.   Harlene SAUNDERS Benedict,  LCSW 11/13/2023

## 2023-11-14 ENCOUNTER — Encounter (INDEPENDENT_AMBULATORY_CARE_PROVIDER_SITE_OTHER): Payer: Self-pay

## 2023-11-14 ENCOUNTER — Ambulatory Visit (INDEPENDENT_AMBULATORY_CARE_PROVIDER_SITE_OTHER): Admitting: Student

## 2023-11-14 ENCOUNTER — Ambulatory Visit: Admitting: Physician Assistant

## 2023-11-14 VITALS — BP 102/57 | HR 88 | Ht 69.0 in | Wt 283.6 lb

## 2023-11-14 DIAGNOSIS — N62 Hypertrophy of breast: Secondary | ICD-10-CM

## 2023-11-14 DIAGNOSIS — M545 Low back pain, unspecified: Secondary | ICD-10-CM | POA: Diagnosis not present

## 2023-11-14 DIAGNOSIS — Z6841 Body Mass Index (BMI) 40.0 and over, adult: Secondary | ICD-10-CM | POA: Diagnosis not present

## 2023-11-14 NOTE — Progress Notes (Signed)
 Referring Provider Leontine Lapine, MD [address] [place],  [place] [address]   CC:  Chief Complaint  Patient presents with   Follow-up      BRITHNEY [name] is an 43 y.o. female.  HPI: Patient is a 43 y.o. year old female here for follow up after completing weight loss prior to consideration for breast reduction surgery.   She was seen for initial consult by Dr. Waddell 09/2022.  At that time, complained of chronic upper back and neck discomfort in the context of large breasts.  She also was undergoing evaluation of palpable mass on right breast felt to be fibroadenoma.  Plan for repeat imaging 03/2023.  STN 37 cm on the right and 34 cm on the left.  Estimated tissue to be removed at time of surgery close 1000 g in the right and 800 g in the left.  Follow-up after imaging in 2025.    Patient then followed up with our clinic 04/15/2023 at which time recent mammogram revealed stable, benign-appearing mass at 3:00 region right breast.  BI-RADS Category 2.  She had been focusing on weight loss, but was hindered by right knee injury.  Patient weighed 278 pounds at that time.  She understood that minimum BMI requirement was less than 40 kg/m.  Plan was for her to follow-up in 3 to 4 months for weight check.  If she is closer to 250 pounds, can likely proceed with submitting for insurance authorization.  Patient was motivated.  Patient was last seen in the clinic on 08/13/2023.  At this visit, patient was doing okay.  Patient at this visit reported that she had recently sprained her left ankle and was seeing physical therapy.  With everything going on, patient was unable to reach her target weight.  Plan is for patient to follow back up in about 3 months.  Today, patient reports she is doing okay.  She states that she has still been struggling to lose weight.  She states that she has been eating for comfort and it is something that she has been struggling with.  She also states that she still  has restrictions with her left ankle, but is going to try swimming as that was a recommended exercise given her ankle issue.  Patient otherwise reports she is doing well.  She denies any recent fevers, chills or changes in her health.  Her weight today is 283 pounds.  Review of Systems General: Denies any recent fevers, chills or changes in her health  Physical Exam    11/14/2023    1:20 PM 10/31/2023    1:19 PM 09/20/2023    9:13 AM  Vitals with BMI  Height 5' 9 5' 9   Weight 283 lbs 10 oz 278 lbs 3 oz   BMI 41.86 41.06   Systolic 102 103   Diastolic 57 62   Pulse 88 68      Information is confidential and restricted. Go to Review Flowsheets to unlock data.    General:  No acute distress,  Alert and oriented, Non-Toxic, Normal speech and affect Psych: Normal mood, normal behavior Pulmonary: Unlabored breathing, no respiratory distress MSK: Ambulatory  Assessment/Plan  Macromastia - Plan: Amb Ref to Medical Weight Management   I discussed with the patient that her BMI is still high at this time to proceed with surgery.  I discussed with her that she has to be a BMI under 40.  She expressed understanding.  Patient states that  she is going to try swimming as mentioned above and she states that she knows she has to stop eating for comfort.  She states that she has been also working on this with her therapist.  I recommended a referral to healthy weight management for her.  She states that she did not have the money before to go, but she has the money now.  She states that she would like to move forward with the referral.  Encourage patient to eat a healthy diet including foods high in protein, plenty of vegetables and avoiding snacks, sugars and sodas.  Patient expressed understanding.  We will plan to have the patient follow back up in about 6 months.  She will call back sooner if she reaches her goal weight sooner.  I instructed her to call if she has any questions or concerns  about anything.  Estefana FORBES Peck 11/14/2023, 1:27 PM

## 2023-11-18 ENCOUNTER — Telehealth (INDEPENDENT_AMBULATORY_CARE_PROVIDER_SITE_OTHER): Payer: Self-pay | Admitting: Family Medicine

## 2023-11-18 ENCOUNTER — Ambulatory Visit

## 2023-11-18 VITALS — BP 112/71 | HR 67 | Temp 98.1°F | Ht 69.0 in | Wt 289.2 lb

## 2023-11-18 DIAGNOSIS — R052 Subacute cough: Secondary | ICD-10-CM | POA: Diagnosis not present

## 2023-11-18 DIAGNOSIS — R059 Cough, unspecified: Secondary | ICD-10-CM | POA: Insufficient documentation

## 2023-11-18 DIAGNOSIS — Z0289 Encounter for other administrative examinations: Secondary | ICD-10-CM

## 2023-11-18 MED ORDER — BENZONATATE 100 MG PO CAPS: 100.0000 mg | ORAL_CAPSULE | Freq: Three times a day (TID) | ORAL | 0 refills | Status: DC | PRN

## 2023-11-18 MED ORDER — FLUTICASONE PROPIONATE 50 MCG/ACT NA SUSP: NASAL | 1 refills | Status: AC

## 2023-11-18 NOTE — Progress Notes (Addendum)
 Acute Office Visit  Subjective:     Patient ID: Jordan [name], female    DOB: 1980-11-15, 43 y.o.   MRN: [id number]  Chief Complaint  Patient presents with   Cough   Sore Throat   Follow-up    HPI Patient reports that she has been coughing consistently and worse at night. No productive sputum, no nasal drainage. No fevers or chills. Sore throat for about two weeks and has been getting worse . She does  have history of acid reflux but has not been worse lately. States that sore throat is constant and hurts when she swallows. No sick contacts that she has been around.  She has been having this cough on and off. Other times she has had a URI associated with cough, but those self resolved without any antibiotics. Patient reports that the last time she was sick was about two months ago. Per chart review patient was diagnosed with reactive airway disease after COVID-19. Denies ever seeing pulmonologist. States that she was prescribed inhaler and used it for a few months but unsure what inhaler it was. She is not taking flonase  consistently at this time.   ROS Noted in HPI     Objective:    BP 112/71 (BP Location: Left Arm, Patient Position: Sitting, Cuff Size: Small)   Pulse 67   Temp 98.1 F (36.7 C) (Oral)   Ht 5' 9 (1.753 m)   Wt 289 lb 3.2 oz (131.2 kg)   SpO2 100%   BMI 42.71 kg/m  BP Readings from Last 3 Encounters:  11/18/23 112/71  11/14/23 (!) 102/57  10/31/23 103/62   Wt Readings from Last 3 Encounters:  11/18/23 289 lb 3.2 oz (131.2 kg)  11/14/23 283 lb 9.6 oz (128.6 kg)  10/31/23 278 lb 3.2 oz (126.2 kg)      Physical Exam Constitutional: Well appearing female, NAD, not toxic appearing  Heart:regular rate and rhythm and no murmurs heard  Lung: no respiratory distress, no wheezes auscultated  ENT: TM intact bilaterally with light reflex seen in both ears  No results found for any visits on 11/18/23.      Assessment & Plan:   Problem List Items  Addressed This Visit     Cough - Primary   Patient has had this cough on and off for the last 2 years.  Signs are stable at this visit patient denies any fever/chills.  Did see diagnosis of reactive airway disease, but unsure of any official diagnosis.  Will get PFTs at this time.  Patient does have a history of using albuterol inhaler and states that it did help her at that time.  Patient does have history of exposure to construction materials within the last month and also has a history of seasonal allergies.  Differential includes seasonal allergies, reactive airway disease, postviral cough. I will also prescribe Flonase  for patient, for which she has taken in the past for this postnasal drip is likely causing her sore throat.  Do not believe any active infectious cause, no cervical lymphadenopathy or erythema noted on exam in the mouth. For cough will also give some Tessalon 's Perles.  My interpretation of PFT study showed FEV1/FVC ratio of 0.714 which is not significant for obstructive disease.  At this time believe that this is not asthma or COPD.  Will monitor for symptom resolution with PFTs at a later date.Was able to message patient about these results on MyChart and called and explained them as well. Patient expressed  understanding of plan.        Meds ordered this encounter  Medications   benzonatate  (TESSALON ) 100 MG capsule    Sig: Take 1 capsule (100 mg total) by mouth 3 (three) times daily as needed for cough.    Dispense:  21 capsule    Refill:  0   fluticasone  (FLONASE ) 50 MCG/ACT nasal spray    Sig: For the first week do 2 sprays in each nostril and then afterwards do 1-2 sprays in each nostril    Dispense:  48 mL    Refill:  1    Return if symptoms worsen or fail to improve.  Zyrion Coey D'Mello, DO

## 2023-11-18 NOTE — Patient Instructions (Addendum)
 Today we discussed the following medical conditions and plan:   For your cough, I have sent in some Tessalon  Perles that you said you have taken in the past and it helps with your coughing.  We would like to some testing of your breathing, and see if there are any other medications or things we need to do based off of the results of this test.  I also believe that part of the reason you are having a sore throat is because you are having some congestion that is causing the sore throat as it is going down your throat.  For this had like to treat the congestion, so I will prescribe some Flonase .  For the first week you will take Flonase  by doing 2 sprays in each nostril daily.  After the first week you can do 1 to 2 sprays in each nostril once a day.    The symptoms do not get better please let us  know.   We look forward to seeing you next time. Please call our clinic at [phone] if you have any questions or concerns. The best time to call is Monday-Friday from 9am-4pm, but there is someone available 24/7. If you need medication refills, please notify your pharmacy one week in advance and they will send us  a request.   Thank you for trusting me with your care. Wishing you the best!   Quintara Bost D'Mello, DO  [facility]

## 2023-11-18 NOTE — Telephone Encounter (Signed)
 New Patient Script Check List Patient Acknowledges Each Item Below:  [x]   There is a one-time $99.00 Program Fee.  [x]   You must have a Body Mass Index (BMI) of 30 or higher to qualify for our program.  [x]   Once you become a New Patient and schedule your first appointment, you are responsible for completing our New Patient Packet.  [x]   Prior to the first appointment, you will be required to fast for eight hours  [x]   You must also drink plenty of water that day before and morning of your new patient appointment.  [x]   You will also have an electrocardiogram (EKG) during your New Patient visit  do not apply any lotions or creams prior to the appointment.   [x]   You will also have a metabolic breathing test known as the Indirect Calorimetry (IC) Test  [x]   We are a Specialty office, but we bill as a Primary Care. You will have the same co-payments and co-insurances as you would at your primary care provider's office.  [x]   We have a 5-minute grace period. If you arrive later than this time, we will need to reschedule your appointment to a later time or date.  [x]   There is a $50 fee if the initial consultation visit, New Patient Appointment, or First Follow-up Appointment is a no-show, so please make sure to pick a time that works with your schedule.   [x]   We recommend that all our patients sign up for and utilize [employer].

## 2023-11-18 NOTE — Assessment & Plan Note (Addendum)
 Patient has had this cough on and off for the last 2 years.  Signs are stable at this visit patient denies any fever/chills.  Did see diagnosis of reactive airway disease, but unsure of any official diagnosis.  Will get PFTs at this time.  Patient does have a history of using albuterol inhaler and states that it did help her at that time.  Patient does have history of exposure to construction materials within the last month and also has a history of seasonal allergies.  Differential includes seasonal allergies, reactive airway disease, postviral cough. I will also prescribe Flonase  for patient, for which she has taken in the past for this postnasal drip is likely causing her sore throat.  Do not believe any active infectious cause, no cervical lymphadenopathy or erythema noted on exam in the mouth. For cough will also give some Tessalon 's Perles.  My interpretation of PFT study showed FEV1/FVC ratio of 0.714 which is not significant for obstructive disease.  At this time believe that this is not asthma or COPD.  Will monitor for symptom resolution with PFTs at a later date.Was able to message patient about these results on MyChart and called and explained them as well. Patient expressed understanding of plan.

## 2023-11-18 NOTE — Progress Notes (Deleted)
   Established Patient Office Visit  Subjective   Patient ID: Jordan [name], female    DOB: 09-12-1980  Age: 43 y.o. MRN: [id number]  Chief Complaint  Patient presents with   Cough   Sore Throat   Follow-up    HPI Jordan [name] is a 43 year old female with past medical history of prediabetes, hyperlipidemia that presents for follow-up.  Robitussin, three times a day and tylenol  twice a day. Coughing consistently and worse at night. No productive sputum, no nasal drainage. No fevers or chills. Sore throat for about two weeks and has been getting worse . Does have history of acid reflux but has not been worse lately. States that sore throat is constant and hurts when she swallows. No sick contacts that she has been around.  She has been having this cough on and off. Other times she had URI but those self resolved without any antibiotics. Patient reports that the last time she was sick was. Unsure of the last time that she was sick. Per chart review patient was diagnosed with reactive airway disease after COVID-19. Denies seeing pulmonologist. States that she was prescribed inhaler and used it for a few months but unsure what inhaler it was.  {History (Optional):23778}  ROS Noted in HPI    Objective:     BP 112/71 (BP Location: Left Arm, Patient Position: Sitting, Cuff Size: Small)   Pulse 67   Temp 98.1 F (36.7 C) (Oral)   Ht 5' 9 (1.753 m)   Wt 289 lb 3.2 oz (131.2 kg)   SpO2 100%   BMI 42.71 kg/m  BP Readings from Last 3 Encounters:  11/18/23 112/71  11/14/23 (!) 102/57  10/31/23 103/62   Wt Readings from Last 3 Encounters:  11/18/23 289 lb 3.2 oz (131.2 kg)  11/14/23 283 lb 9.6 oz (128.6 kg)  10/31/23 278 lb 3.2 oz (126.2 kg)      Physical Exam Constitutional: Well appearing female, NAD, not toxic appearing  Heart: Lung:  ENT:   No results found for any visits on 11/18/23.  Last lipids Lab Results  Component Value Date   CHOL 216 (H) 06/17/2023   HDL 87  06/17/2023   LDLCALC 114 (H) 06/17/2023   TRIG 89 06/17/2023   CHOLHDL 2.5 06/17/2023   Last hemoglobin A1c Lab Results  Component Value Date   HGBA1C 5.7 (A) 06/17/2023      The 10-year ASCVD risk score (Arnett DK, et al., 2019) is: 0.2%    Assessment & Plan:   Problem List Items Addressed This Visit   None Non productive cough-  Could swab for strep, upper respiratory, reactive airway disease? PFTs?    Prediabetes 5.7.   Obesity BMI of greater than 40 -any history of stroke, hypertension, still getting menstrual periods, any chance she could be pregnant,.  Patient needs BMI of 48 to be able to get breast reduction surgery.  Is following with plastic surgery for this.  Patient has gotten weight loss management referral from Plastic surgery   Health maintenance Patient will get flu shot today  No follow-ups on file.    Inigo Lantigua D'Mello, DO

## 2023-11-19 ENCOUNTER — Ambulatory Visit (INDEPENDENT_AMBULATORY_CARE_PROVIDER_SITE_OTHER): Admitting: Family Medicine

## 2023-11-19 ENCOUNTER — Encounter (INDEPENDENT_AMBULATORY_CARE_PROVIDER_SITE_OTHER): Payer: Self-pay | Admitting: Family Medicine

## 2023-11-19 VITALS — BP 94/64 | HR 61 | Temp 98.1°F | Ht 69.0 in | Wt 280.0 lb

## 2023-11-19 DIAGNOSIS — Z1331 Encounter for screening for depression: Secondary | ICD-10-CM | POA: Diagnosis not present

## 2023-11-19 DIAGNOSIS — R7303 Prediabetes: Secondary | ICD-10-CM | POA: Insufficient documentation

## 2023-11-19 DIAGNOSIS — R0602 Shortness of breath: Secondary | ICD-10-CM | POA: Diagnosis not present

## 2023-11-19 DIAGNOSIS — F319 Bipolar disorder, unspecified: Secondary | ICD-10-CM | POA: Diagnosis not present

## 2023-11-19 DIAGNOSIS — M797 Fibromyalgia: Secondary | ICD-10-CM | POA: Diagnosis not present

## 2023-11-19 DIAGNOSIS — R5383 Other fatigue: Secondary | ICD-10-CM | POA: Diagnosis not present

## 2023-11-19 DIAGNOSIS — E7800 Pure hypercholesterolemia, unspecified: Secondary | ICD-10-CM

## 2023-11-19 DIAGNOSIS — R29818 Other symptoms and signs involving the nervous system: Secondary | ICD-10-CM | POA: Diagnosis not present

## 2023-11-19 DIAGNOSIS — Z6841 Body Mass Index (BMI) 40.0 and over, adult: Secondary | ICD-10-CM

## 2023-11-19 NOTE — Assessment & Plan Note (Addendum)
 On lyrica  and is managed by internal medicine doctor.  She is on Lyrica  150 2x a day.  Follow up with PCP for further management.

## 2023-11-19 NOTE — Assessment & Plan Note (Addendum)
 A1c of 5.7 on last labs earlier this year.  A1c and Insulin  level today.  Plan to discuss lab results at next appointment.

## 2023-11-19 NOTE — Progress Notes (Signed)
 Chief Complaint:  Obesity   Subjective:  Jordan [name] (MR# [id number]) is a 43 y.o. female who presents for evaluation and treatment of obesity and related comorbidities.   Jordan [name] is currently in the action stage of change and ready to dedicate time achieving and maintaining a healthier weight. Jordan [name] is interested in becoming our patient and working on intensive lifestyle modifications including (but not limited to) diet and exercise for weight loss.  Jordan [name] has been struggling with her weight. She has been unsuccessful in either losing weight, maintaining weight loss, or reaching her healthy weight goal.  Patient here to reestablish care and mentions that she is planning for upcoming surgery for breast mass removal.  Needs to lose 30 lbs for surgery.  She previously saw Dr. Prentiss for 2 appointments.  Since that time she had surgery to shrink fibroids and then has gotten injured 3 times doing critical home repair.  Patient is lactose intolerance and she voices she does not tolerate any dairy.  Doesn't tolerate much beef.  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  Lives with her partner Jordan [name] (TT) and Jordan [name] (3 year old bonus child).  Family previously ate out most of the time and would drink only juice and soda.  Desired weight is 200lbs.  Last time she was that weight was in her early 30s but then her back pain flared again and she had to get a series of injections.  She mentions this really impacted her mentally and she is an Surveyor, quantity. Skips breakfast sometimes which is attributed to lack of remembering to eat.   Food Recall: Smoothie occasionally in the am- frozen fruit and plant based protein (orgain) or an apple or another fruit. Feels satisfied.  Eats again around 2pm.  Would be leftovers like salisbury steak (4oz), gravy, onions, 1/2 mashed potatoes and 1/2 cauliflower  (1 cup) and chobani lactose free drinks (felt full).  Dinner is 7pm and patient had  [place] with chicken, rice and vegetables.  She had a bowl of that which was around 1.5 cups. Felt full.   Patient eats emotionally and mentions she is not aware she is doing it when she does.   Indirect Calorimeter completed today shows a RMR: 1670`.  Her calculated basal metabolic rate is 7954 thus her basal metabolic rate is worse than expected.  Other Fatigue Jordan [name] admits to daytime somnolence and admits to waking up still tired. Patient has a history of symptoms of daytime fatigue. Jordan [name] generally gets 5 or 6 hours of sleep per night, and states that she has generally restless sleep. Snoring is present. Apneic episodes are present. Epworth Sleepiness Score is 4.   Shortness of Breath Jordan [name] notes increasing shortness of breath with exercising and seems to be worsening over time with weight gain. She notes getting out of breath sooner with activity than she used to. This has not gotten worse recently. Jordan [name] denies shortness of breath at rest or orthopnea.  Depression Screen Jordan [name]'s Food and Mood (modified PHQ-9) score was 3.     11/19/2023    9:52 AM  Depression screen PHQ 2/9  Decreased Interest 0  Down, Depressed, Hopeless 0  PHQ - 2 Score 0  Altered sleeping 2  Tired, decreased energy 2  Change in appetite 1  Feeling bad or failure about yourself  0  Trouble concentrating 0  Moving slowly or fidgety/restless 0  Suicidal thoughts 0  PHQ-9 Score 5  Difficult doing work/chores Somewhat  difficult     Objective:  Vitals Temp: 98.1 F (36.7 C) BP: 94/64 Pulse Rate: 61 SpO2: 100 %   Anthropometric Measurements Height: 5' 9 (1.753 m) Weight: 280 lb (127 kg) BMI (Calculated): 41.33 Starting Weight: 280 lb Peak Weight: 300lb Waist Measurement : 51 inches   Body Composition  Body Fat %: 49.1 % Fat Mass (lbs): 137.6 lbs Muscle Mass (lbs): 135.4 lbs Total Body Water  (lbs): 97.2 lbs Visceral Fat Rating : 14   Other Clinical Data RMR: 1670 Fasting:  yes Labs: yes Today's Visit #: 1 Starting Date: 11/19/23 Comments: 1    EKG: Normal sinus rhythm, rate 58.  General: Cooperative, alert, well developed, in no acute distress. HEENT: Conjunctivae and lids unremarkable. Cardiovascular: Regular rhythm.  Lungs: Normal work of breathing. Neurologic: No focal deficits.   Lab Results  Component Value Date   CREATININE 0.70 06/17/2023   BUN 16 06/17/2023   NA 139 06/17/2023   K 4.3 06/17/2023   CL 104 06/17/2023   CO2 24 06/17/2023   Lab Results  Component Value Date   ALT 9 02/11/2023   AST 13 02/11/2023   ALKPHOS 44 02/11/2023   BILITOT <0.2 02/11/2023   Lab Results  Component Value Date   HGBA1C 5.7 (A) 06/17/2023   HGBA1C 5.6 03/14/2022   HGBA1C 5.4 08/10/2020   HGBA1C 5.5 07/09/2019   HGBA1C 5.5 12/02/2018   Lab Results  Component Value Date   INSULIN  9.2 07/09/2019   Lab Results  Component Value Date   TSH 2.650 06/17/2023   Lab Results  Component Value Date   CHOL 216 (H) 06/17/2023   HDL 87 06/17/2023   LDLCALC 114 (H) 06/17/2023   TRIG 89 06/17/2023   CHOLHDL 2.5 06/17/2023   Lab Results  Component Value Date   WBC 6.1 03/14/2022   HGB 13.9 03/14/2022   HCT 41.7 03/14/2022   MCV 87 03/14/2022   PLT 204 03/14/2022   Lab Results  Component Value Date   IRON  58 06/17/2023   TIBC 296 06/17/2023   FERRITIN 16 06/17/2023    Assessment and Plan:  Assessment & Plan Other fatigue  SOBOE (shortness of breath on exertion)  Depression screening  Prediabetes A1c of 5.7 on last labs earlier this year.  A1c and Insulin  level today.  Plan to discuss lab results at next appointment. Pure hypercholesterolemia Elevated LDL previously.  She is not on medication and never has been.  Will order FLP today to reassess levels then risk stratify at next appointment. Bipolar affective disorder, remission status unspecified (HCC) Sees Dr. Arfeen and is on depakote .  She feels well managed currently.  Continue  current medication. Fibromyalgia On lyrica  and is managed by internal medicine doctor.  She is on Lyrica  150 2x a day.  Follow up with PCP for further management. BMI 40.0-44.9, adult (HCC)  Morbid obesity (HCC)    Other Fatigue  Jordan [name] does feel that her weight is causing her energy to be lower than it should be. Fatigue may be related to obesity, depression or many other causes. Labs will be ordered, and in the meanwhile, Jordan [name] will focus on self care including making healthy food choices, increasing physical activity and focusing on stress reduction.  Shortness of Breath  Jordan [name] does feel that she gets out of breath more easily that she used to when she exercises. Jordan [name]'s shortness of breath appears to be obesity related and exercise induced. She has agreed to work on weight loss and gradually increase  exercise to treat her exercise induced shortness of breath. Will continue to monitor closely.   Problem List Items Addressed This Visit       Other   Fatigue - Primary   Relevant Orders   EKG 12-Lead   Other Visit Diagnoses       SOBOE (shortness of breath on exertion)       Relevant Orders   EKG 12-Lead     Depression screening         Morbid obesity (HCC)         BMI 40.0-44.9, adult (HCC)           Jordan [name] is currently in the action stage of change and her goal is to continue with weight loss efforts. I recommend Jordan [name] begin the structured treatment plan as follows:  She has agreed to Category 2 Plan  Exercise goals: For substantial health benefits, adults should do at least 150 minutes (2 hours and 30 minutes) a week of moderate-intensity, or 75 minutes (1 hour and 15 minutes) a week of vigorous-intensity aerobic physical activity, or an equivalent combination of moderate- and vigorous-intensity aerobic activity. Aerobic activity should be performed in episodes of at least 10 minutes, and preferably, it should be spread throughout the week.  Behavioral modification  strategies:increasing lean protein intake, decreasing simple carbohydrates, increasing vegetables, meal planning and cooking strategies, and planning for success  She was informed of the importance of frequent follow-up visits to maximize her success with intensive lifestyle modifications for her multiple health conditions. She was informed we would discuss her lab results at her next visit unless there is a critical issue that needs to be addressed sooner. Jordan [name] agreed to keep her next visit at the agreed upon time to discuss these results.  Labs ordered with plans to discuss at the next visit.   Attestation Statements:  Reviewed by clinician on day of visit: allergies, medications, problem list, medical history, surgical history, family history, social history, and previous encounter notes.  This is the patient's first visit at [facility]. The patient's NEW PATIENT PACKET was reviewed at length. Included in the packet: current and past health history, medications, allergies, ROS, gynecologic history (women only), surgical history, family history, social history, weight history, weight loss surgery history (for those that have had weight loss surgery), nutritional evaluation, mood and food questionnaire, PHQ9, Epworth questionnaire, sleep habits questionnaire, patient life and health improvement goals questionnaire. These will all be scanned into the patient's chart under media.   During the visit, I independently reviewed the patient's EKG, bioimpedance scale results, and indirect calorimeter results. I used this information to tailor a meal plan for the patient that will help her to lose weight and will improve her obesity-related conditions going forward. I performed a medically necessary appropriate examination and/or evaluation. I discussed the assessment and treatment plan with the patient. The patient was provided an opportunity to ask questions and all were answered. The  patient agreed with the plan and demonstrated an understanding of the instructions. Labs were ordered at this visit and will be reviewed at the next visit unless more critical results need to be addressed immediately. Clinical information was updated and documented in the EMR.    Jordan [name] Cho, MD

## 2023-11-19 NOTE — Addendum Note (Signed)
 Addended by: KARNA FELLOWS on: 11/19/2023 02:08 PM   Modules accepted: Level of Service

## 2023-11-19 NOTE — Progress Notes (Signed)
 Internal Medicine Clinic Attending  I was physically present during the key portions of the resident provided service and participated in the medical decision making of patient's management care. I reviewed pertinent patient test results.  The assessment, diagnosis, and plan were formulated together and I agree with the documentation in the resident's note.  Cough with PND over the past two weeks. She has had several recurrences of such symptoms over the past year. No additional signs/symptoms of infection. Suspect possible allergic rhinitis component. Start Flonase , may add non-sedating antihistamine if not improving. Chart history of reactive airway disease, no history of asthma diagnosis. In-office spirometry with non-obstructive FEV1/FVC. May consider full PFTs or challenge if symptoms do not improve and there is continued suspicion of airway disease.  Karna Fellows, MD

## 2023-11-19 NOTE — Assessment & Plan Note (Addendum)
 Sees Dr. Arfeen and is on depakote .  She feels well managed currently.  Continue current medication.

## 2023-11-19 NOTE — Assessment & Plan Note (Signed)
 Elevated LDL previously.  She is not on medication and never has been.  Will order FLP today to reassess levels then risk stratify at next appointment.

## 2023-11-21 DIAGNOSIS — M545 Low back pain, unspecified: Secondary | ICD-10-CM | POA: Diagnosis not present

## 2023-11-21 LAB — CBC WITH DIFFERENTIAL/PLATELET
Basophils Absolute: 0 x10E3/uL (ref 0.0–0.2)
Basos: 1 %
EOS (ABSOLUTE): 0.1 x10E3/uL (ref 0.0–0.4)
Eos: 1 %
Hematocrit: 41.9 % (ref 34.0–46.6)
Hemoglobin: 13.6 g/dL (ref 11.1–15.9)
Immature Grans (Abs): 0 x10E3/uL (ref 0.0–0.1)
Immature Granulocytes: 0 %
Lymphocytes Absolute: 2.8 x10E3/uL (ref 0.7–3.1)
Lymphs: 44 %
MCH: 28.5 pg (ref 26.6–33.0)
MCHC: 32.5 g/dL (ref 31.5–35.7)
MCV: 88 fL (ref 79–97)
Monocytes Absolute: 0.7 x10E3/uL (ref 0.1–0.9)
Monocytes: 11 %
Neutrophils Absolute: 2.7 x10E3/uL (ref 1.4–7.0)
Neutrophils: 43 %
Platelets: 195 x10E3/uL (ref 150–450)
RBC: 4.78 x10E6/uL (ref 3.77–5.28)
RDW: 15 % (ref 11.7–15.4)
WBC: 6.2 x10E3/uL (ref 3.4–10.8)

## 2023-11-21 LAB — COMPREHENSIVE METABOLIC PANEL WITH GFR
ALT: 11 IU/L (ref 0–32)
AST: 14 IU/L (ref 0–40)
Albumin: 3.9 g/dL (ref 3.9–4.9)
Alkaline Phosphatase: 47 IU/L (ref 41–116)
BUN/Creatinine Ratio: 15 (ref 9–23)
BUN: 11 mg/dL (ref 6–24)
Bilirubin Total: 0.2 mg/dL (ref 0.0–1.2)
CO2: 25 mmol/L (ref 20–29)
Calcium: 9.3 mg/dL (ref 8.7–10.2)
Chloride: 100 mmol/L (ref 96–106)
Creatinine, Ser: 0.72 mg/dL (ref 0.57–1.00)
Globulin, Total: 2.3 g/dL (ref 1.5–4.5)
Glucose: 80 mg/dL (ref 70–99)
Potassium: 4.3 mmol/L (ref 3.5–5.2)
Sodium: 137 mmol/L (ref 134–144)
Total Protein: 6.2 g/dL (ref 6.0–8.5)
eGFR: 106 mL/min/1.73 (ref >59)

## 2023-11-21 LAB — FOLATE: Folate: 12.6 ng/mL (ref >3.0)

## 2023-11-21 LAB — T3: T3, Total: 104 ng/dL (ref 71–180)

## 2023-11-21 LAB — INSULIN, RANDOM: INSULIN: 13.8 u[IU]/mL (ref 2.6–24.9)

## 2023-11-21 LAB — VITAMIN B12: Vitamin B-12: 640 pg/mL (ref 232–1245)

## 2023-11-21 LAB — HEMOGLOBIN A1C
Est. average glucose Bld gHb Est-mCnc: 126 mg/dL
Hgb A1c MFr Bld: 6 % — ABNORMAL HIGH (ref 4.8–5.6)

## 2023-11-21 LAB — T4, FREE: Free T4: 0.89 ng/dL (ref 0.82–1.77)

## 2023-11-21 LAB — LIPID PANEL WITH LDL/HDL RATIO
Cholesterol, Total: 232 mg/dL — ABNORMAL HIGH (ref 100–199)
HDL: 77 mg/dL (ref >39)
LDL Chol Calc (NIH): 133 mg/dL — ABNORMAL HIGH (ref 0–99)
LDL/HDL Ratio: 1.7 ratio (ref 0.0–3.2)
Triglycerides: 126 mg/dL (ref 0–149)
VLDL Cholesterol Cal: 22 mg/dL (ref 5–40)

## 2023-11-21 LAB — VITAMIN D 25 HYDROXY (VIT D DEFICIENCY, FRACTURES): Vit D, 25-Hydroxy: 27.7 ng/mL — ABNORMAL LOW (ref 30.0–100.0)

## 2023-11-21 LAB — TSH: TSH: 2.88 u[IU]/mL (ref 0.450–4.500)

## 2023-12-03 ENCOUNTER — Emergency Department (HOSPITAL_COMMUNITY)

## 2023-12-03 ENCOUNTER — Emergency Department (HOSPITAL_COMMUNITY): Admission: EM | Admit: 2023-12-03 | Discharge: 2023-12-03 | Discharge status: Against Medical Advice

## 2023-12-03 ENCOUNTER — Encounter (INDEPENDENT_AMBULATORY_CARE_PROVIDER_SITE_OTHER): Payer: Self-pay | Admitting: Family Medicine

## 2023-12-03 ENCOUNTER — Other Ambulatory Visit: Payer: Self-pay

## 2023-12-03 ENCOUNTER — Ambulatory Visit (INDEPENDENT_AMBULATORY_CARE_PROVIDER_SITE_OTHER): Admitting: Licensed Clinical Social Worker

## 2023-12-03 ENCOUNTER — Ambulatory Visit (INDEPENDENT_AMBULATORY_CARE_PROVIDER_SITE_OTHER): Admitting: Family Medicine

## 2023-12-03 ENCOUNTER — Encounter (HOSPITAL_COMMUNITY): Payer: Self-pay | Admitting: Emergency Medicine

## 2023-12-03 VITALS — BP 101/70 | HR 68 | Temp 97.9°F | Ht 69.0 in | Wt 281.0 lb

## 2023-12-03 DIAGNOSIS — R1111 Vomiting without nausea: Secondary | ICD-10-CM | POA: Diagnosis not present

## 2023-12-03 DIAGNOSIS — Z5321 Procedure and treatment not carried out due to patient leaving prior to being seen by health care provider: Secondary | ICD-10-CM | POA: Insufficient documentation

## 2023-12-03 DIAGNOSIS — R079 Chest pain, unspecified: Secondary | ICD-10-CM | POA: Diagnosis not present

## 2023-12-03 DIAGNOSIS — R0789 Other chest pain: Secondary | ICD-10-CM | POA: Diagnosis not present

## 2023-12-03 DIAGNOSIS — F3131 Bipolar disorder, current episode depressed, mild: Secondary | ICD-10-CM

## 2023-12-03 DIAGNOSIS — Z743 Need for continuous supervision: Secondary | ICD-10-CM | POA: Diagnosis not present

## 2023-12-03 DIAGNOSIS — R059 Cough, unspecified: Secondary | ICD-10-CM | POA: Diagnosis not present

## 2023-12-03 DIAGNOSIS — R0602 Shortness of breath: Secondary | ICD-10-CM | POA: Diagnosis not present

## 2023-12-03 LAB — CBC
HCT: 39 % (ref 36.0–46.0)
Hemoglobin: 12.7 g/dL (ref 12.0–15.0)
MCH: 28.3 pg (ref 26.0–34.0)
MCHC: 32.6 g/dL (ref 30.0–36.0)
MCV: 86.9 fL (ref 80.0–100.0)
Platelets: 198 K/uL (ref 150–400)
RBC: 4.49 MIL/uL (ref 3.87–5.11)
RDW: 16.2 % — ABNORMAL HIGH (ref 11.5–15.5)
WBC: 7.6 K/uL (ref 4.0–10.5)
nRBC: 0 % (ref 0.0–0.2)

## 2023-12-03 LAB — BASIC METABOLIC PANEL WITH GFR
Anion gap: 11 (ref 5–15)
BUN: 11 mg/dL (ref 6–20)
CO2: 26 mmol/L (ref 22–32)
Calcium: 9.1 mg/dL (ref 8.9–10.3)
Chloride: 98 mmol/L (ref 98–111)
Creatinine, Ser: 0.8 mg/dL (ref 0.44–1.00)
GFR, Estimated: >60 mL/min (ref >60)
Glucose, Bld: 83 mg/dL (ref 70–99)
Potassium: 4.1 mmol/L (ref 3.5–5.1)
Sodium: 135 mmol/L (ref 135–145)

## 2023-12-03 LAB — HCG, SERUM, QUALITATIVE: Preg, Serum: NEGATIVE

## 2023-12-03 LAB — TROPONIN I (HIGH SENSITIVITY)
Troponin I (High Sensitivity): 3 ng/L (ref <18)
Troponin I (High Sensitivity): 3 ng/L (ref <18)

## 2023-12-03 NOTE — ED Triage Notes (Signed)
 PT BIB [facility] from PCP for CP since yest AM.  Worse with movement and deep breath.  Middle of chest to back. Emesis yesterday, none today.  No cardiac hx.   EKG unremarkable.    324 ASA  130/87 100% HR 66 RR 20 CBG 101

## 2023-12-03 NOTE — ED Provider Triage Note (Signed)
 Emergency Medicine Provider Triage Evaluation Note  Jordan [name] , a 43 y.o. female  was evaluated in triage.  Pt complains of chest pain  Review of Systems  Positive: Chest pain Negative: Shortness of breath, lightheadedness, nausea  Physical Exam  BP 109/72 (BP Location: Right Arm)   Pulse 66   Temp 98.5 F (36.9 C)   Resp 17   LMP 11/27/2023   SpO2 100%  Gen:   Awake, no distress   Resp:  Normal effort  MSK:   Moves extremities without difficulty  Other:    Medical Decision Making  Medically screening exam initiated at 12:19 PM.  Appropriate orders placed.  Jordan [name] was informed that the remainder of the evaluation will be completed by another provider, this initial triage assessment does not replace that evaluation, and the importance of remaining in the ED until their evaluation is complete.     Gennaro Duwaine CROME, DO 12/03/23 1219

## 2023-12-03 NOTE — Progress Notes (Signed)
   SUBJECTIVE:  Chief Complaint: Obesity  Interim History: Patient here for first follow up.  However, patient reports waking up yesterday with significant chest pain and chest pressure that is radiating to her back.  She initially thought this was gas and took a muscle relaxant, tums, tylenol  and did not experience any improvement.  She is in visible distress today.  She is upset about plan to go to the hospital.  She wants to get healthy.  She slept in her recliner last night. Nothing is making pain better and she feels like the top part of her body is being squeezed.   Jordan [name] is here to discuss her progress with her obesity treatment plan. She is on the Category 2 Plan and states she is following her eating plan approximately 25 % of the time. She states she is not exercising.  OBJECTIVE: Visit Diagnoses: Problem List Items Addressed This Visit   None   Vitals Temp: 97.9 F (36.6 C) BP: 101/70 Pulse Rate: 68 SpO2: 100 %   Anthropometric Measurements Height: 5' 9 (1.753 m) Weight: 281 lb (127.5 kg) BMI (Calculated): 41.48 Weight at Last Visit: 280 lb Weight Lost Since Last Visit: 0 Weight Gained Since Last Visit: 1 Starting Weight: 280 lb Total Weight Loss (lbs): 0 lb (0 kg)   Body Composition  Body Fat %: 49.1 % Fat Mass (lbs): 138.4 lbs Muscle Mass (lbs): 136.2 lbs Total Body Water  (lbs): 98.8 lbs Visceral Fat Rating : 14   Other Clinical Data Today's Visit #: 2 Starting Date: 11/19/23 Comments: Cat 2     ASSESSMENT AND PLAN:   Patient to be sent to ED via ambulance for further evaluation.  Sign out given to paramedics.  Patient to get a follow up appointment at clinic after evaluation.  I have reviewed the above documentation for accuracy and completeness, and I agree with the above. - [name], MD

## 2023-12-03 NOTE — ED Notes (Signed)
 Pt leaving d/t wait time and need for home meds.

## 2023-12-04 ENCOUNTER — Ambulatory Visit: Payer: Self-pay | Admitting: *Deleted

## 2023-12-04 ENCOUNTER — Emergency Department (HOSPITAL_BASED_OUTPATIENT_CLINIC_OR_DEPARTMENT_OTHER)
Admission: EM | Admit: 2023-12-04 | Discharge: 2023-12-04 | Disposition: A | Discharge status: Home / Self Care | Attending: Emergency Medicine | Admitting: Emergency Medicine

## 2023-12-04 DIAGNOSIS — R0789 Other chest pain: Secondary | ICD-10-CM | POA: Diagnosis not present

## 2023-12-04 DIAGNOSIS — M940 Chondrocostal junction syndrome [Tietze]: Secondary | ICD-10-CM | POA: Diagnosis not present

## 2023-12-04 DIAGNOSIS — R079 Chest pain, unspecified: Secondary | ICD-10-CM | POA: Diagnosis present

## 2023-12-04 MED ORDER — PROMETHAZINE-DM 6.25-15 MG/5ML PO SYRP: 5.0000 mL | ORAL_SOLUTION | Freq: Four times a day (QID) | ORAL | 0 refills | Status: AC | PRN

## 2023-12-04 MED ORDER — NAPROXEN 500 MG PO TABS: 500.0000 mg | ORAL_TABLET | Freq: Two times a day (BID) | ORAL | 0 refills | Status: AC

## 2023-12-04 NOTE — ED Notes (Signed)
 DC paperwork given and verbally understood.

## 2023-12-04 NOTE — ED Provider Notes (Signed)
 [facility] AT [facility] Provider Note   CSN: [id number] Arrival date & time: 12/04/23  1020     Patient presents with: Chest Pain   Jordan [name] is a 43 y.o. female.  Patient presents to the emergency department today with concerns of chest pain.  She reports feelings of chest pain for the last 2 days that is central in nature with a feeling of pressure and tightness with some radiation towards the back.  She states that the pain typically worsens with lifting, movement, and at times when laying flat.  Tried antacid medication yesterday without relief.  Was seen at [facility] emergency department yesterday and had labs and imaging performed but left before being seen.  She denies any worsening of symptoms and states that she had slight improvement in the last day.   Chest Pain      Prior to Admission medications   Medication Sig Start Date End Date Taking? Authorizing Provider  naproxen (NAPROSYN) 500 MG tablet Take 1 tablet (500 mg total) by mouth 2 (two) times daily. 12/04/23  Yes Lyrica Mcclarty A, PA-C  promethazine -dextromethorphan (PROMETHAZINE -DM) 6.25-15 MG/5ML syrup Take 5 mLs by mouth 4 (four) times daily as needed for cough. 12/04/23  Yes Demitrus Francisco A, PA-C  Ascorbic Acid (VITAMIN C GUMMIE PO) Take by mouth.    [provider]  diclofenac  Sodium (VOLTAREN ) 1 % GEL APPLY 4 G TOPICALLY 4 TIMES DAILY 08/09/20   Guilloud, Carolyn, MD  divalproex  (DEPAKOTE  ER) 500 MG 24 hr tablet Take 2 tablets (1,000 mg total) by mouth daily. 09/20/23 12/19/23  Curry Leni DASEN, MD  fluticasone  (FLONASE ) 50 MCG/ACT nasal spray For the first week do 2 sprays in each nostril and then afterwards do 1-2 sprays in each nostril 11/18/23   D'Mello, Rosalyn, DO  hydrOXYzine  (ATARAX ) 10 MG tablet Take 1-2 tablets (10-20 mg total) by mouth daily as needed for anxiety. 09/20/23   Arfeen, Leni DASEN, MD  Multiple Vitamins-Minerals (ADULT ONE DAILY GUMMIES PO) Take 2 tablets by  mouth daily.    [provider]  pregabalin  (LYRICA ) 150 MG capsule Take 1 capsule (150 mg total) by mouth 2 (two) times daily. 10/24/23   Zheng, Michael, DO    Allergies: Patient has no known allergies.    Review of Systems  Cardiovascular:  Positive for chest pain.  All other systems reviewed and are negative.   Updated Vital Signs BP 97/65   Pulse 63   Temp 98.3 F (36.8 C) (Oral)   Resp 14   Ht 5' 9 (1.753 m)   Wt 128.4 kg   LMP 11/27/2023 (Exact Date)   SpO2 100%   BMI 41.79 kg/m   Physical Exam Vitals and nursing note reviewed.  Constitutional:      General: She is not in acute distress.    Appearance: She is well-developed.  HENT:     Head: Normocephalic and atraumatic.  Eyes:     Conjunctiva/sclera: Conjunctivae normal.  Cardiovascular:     Rate and Rhythm: Normal rate and regular rhythm.     Heart sounds: No murmur heard. Pulmonary:     Effort: Pulmonary effort is normal. No respiratory distress.     Breath sounds: Normal breath sounds. No decreased breath sounds, wheezing or rhonchi.  Chest:     Chest wall: Tenderness present.       Comments: Reproducible chest wall tenderness with anterior and posterior pressure on the sternum and thoracic spine.  Pain elicited is  worse towards the left sternal margin. Abdominal:     Palpations: Abdomen is soft.     Tenderness: There is no abdominal tenderness.  Musculoskeletal:        General: No swelling.     Cervical back: Neck supple.  Skin:    General: Skin is warm and dry.     Capillary Refill: Capillary refill takes less than 2 seconds.  Neurological:     Mental Status: She is alert.  Psychiatric:        Mood and Affect: Mood normal.     (all labs ordered are listed, but only abnormal results are displayed) Labs Reviewed - No data to display  EKG: None  Radiology: DG Chest 2 View Result Date: 12/03/2023 CLINICAL DATA:  Chest pain EXAM: CHEST - 2 VIEW COMPARISON:  Chest radiograph August 12, 2012 FINDINGS: The heart size and mediastinal contours are within normal limits. Both lungs are clear. The visualized skeletal structures are unremarkable. IMPRESSION: No active cardiopulmonary disease. Electronically Signed   By: Megan  Zare M.D.   On: 12/03/2023 13:41     Procedures   Medications Ordered in the ED - No data to display                                  Medical Decision Making  This patient presents to the ED for concern of chest pain, this involves an extensive number of treatment options, and is a complaint that carries with it a high risk of complications and morbidity.  The differential diagnosis includes ACS, PE, pneumonia, bronchitis, costochondritis   Co morbidities that complicate the patient evaluation  Bipolar disorder, fibromyalgia, iron  deficiency   Lab Tests:  I Ordered, and personally interpreted labs.  The pertinent results include: Labs from 12/03/2023 show no acute findings on CBC, BMP, and troponin negative x 2, hCG negative.   Imaging Studies ordered:  I ordered imaging studies including chest x-ray from 12/03/2023 I independently visualized and interpreted imaging which showed negative for any acute cardiopulmonary process I agree with the radiologist interpretation   Cardiac Monitoring: / EKG:  The patient was maintained on a cardiac monitor.  I personally viewed and interpreted the cardiac monitored which showed an underlying rhythm of: Sinus rhythm   Consultations Obtained:  I requested consultation with none,  and discussed lab and imaging findings as well as pertinent plan - they recommend: N/A   Problem List / ED Course / Critical interventions / Medication management  Patient presents the emergency department today with concerns of chest pain.  Was seen at the emergency department yesterday at [facility] but left before being seen but was triaged and had labs and imaging performed.  She states that she has had a cough ongoing for  the last 2 or 3 weeks that was managed with cough medication and has improved with this.  She states that when she coughs she feels pain at the sternum with radiation towards the back of her chest/upper back.  Denies any feelings of shortness of breath.  No reported hemoptysis. Exam is reassuring with no abnormal heart or lung sounds.  Anterior and posterior compression of the chest elicits pain primarily towards the left sternal border. On chart review, saw labs are performed yesterday including CBC, BMP, and troponins.  These labs are reassuring with troponin negative x 2.  Chest x-ray performed yesterday as well as negative for any acute cardiopulmonary  findings.  EKG shows sinus rhythm with no ischemic changes.  Based on exam and combination of negative labs from yesterday, feel the patient is most likely experiencing symptoms of costochondritis.  Will discharge with a plan for high-dose anti-inflammatory medications for the next 1 to 2 weeks to try to manage her symptoms.  Advise close follow-up with PCP.  Return precautions discussed.  Discharged home in stable condition. I have reviewed the patients home medicines and have made adjustments as needed   Social Determinants of Health:  None   Test / Admission - Considered:  Considered but stable for outpatient follow-up.  Final diagnoses:  Costochondritis    ED Discharge Orders          Ordered    naproxen (NAPROSYN) 500 MG tablet  2 times daily        12/04/23 1110    promethazine -dextromethorphan (PROMETHAZINE -DM) 6.25-15 MG/5ML syrup  4 times daily PRN        12/04/23 1110               Johntavious Francom A, PA-C 12/04/23 1111    Yolande Lamar BROCKS, MD 12/10/23 [phone]

## 2023-12-04 NOTE — Telephone Encounter (Signed)
 Called pt about CP - stated she's on the way back to the ER.

## 2023-12-04 NOTE — Discharge Instructions (Addendum)
 You are seen in the emergency department today for concerns of chest pain.  Based on your exam and labs performed from yesterday, this does not appear to be cardiac in nature and more than likely due to a condition called costochondritis which is from what I suspect was a recent viral respiratory infection that has caused coughing for that has not fully subsided.  I have sent you a prescription for cough medication as well as high-dose anti-inflammatories.  Please take these as prescribed.  Follow-up with your primary care provider for further assessment.  Return to the emergency department for any concerns of new or worsening symptoms.

## 2023-12-04 NOTE — Telephone Encounter (Signed)
 Recommended ED. Patient went yesterday but 9 hour wait. Requesting to be seen in OV instead. Unsure if patient will go back to ED.      FYI Only or Action Required?: Action required by provider: request for appointment and update on patient condition.  Patient was last seen in primary care on 12/03/2023 by [place] Adelita PENNER, MD.  Called Nurse Triage reporting Chest Pain.  Symptoms began yesterday.  Interventions attempted: OTC medications: took 4 baby aspirin.  Symptoms are: gradually worsening.  Triage Disposition: Go to ED Now (Notify PCP)  Patient/caregiver understands and will follow disposition?: Unsure                    Copied from CRM [id number]. Topic: Clinical - Red Word Triage >> Dec 04, 2023  9:05 AM Susanna ORN wrote: Red Word that prompted transfer to Nurse Triage: Patient states she went to ER yesterday for chest pains and was there about 9 hours without being seen. Patient states she left. She's still having chest pains today and wants to be seen in office if possible. Reason for Disposition  [1] Chest pain (or angina) comes and goes AND [2] is happening more often (increasing in frequency) or getting worse (increasing in severity)  (Exception: Chest pains that last only a few seconds.)  Answer Assessment - Initial Assessment Questions Recommended to go back to ED and allow someone to drive. Patient reports she sat in ED yesterday 9 hours and had to leave due to taking other medications she did not have with her for bipolar. Frustrated by wait and left. Still having chest pressure and pain with exertion. Can not lay flat sharp pains in center of chest and tightness. Tried mutilple OTC alkazelters, gas pills with no relief. Patient would like appt with PCP. Highly encouraged patient to go back to ED. Unsure of disposition.   CAL notified.      1. LOCATION: Where does it hurt?       Middle of breast to back  2. RADIATION: Does the pain go  anywhere else? (e.g., into neck, jaw, arms, back)     Back  3. ONSET: When did the chest pain begin? (Minutes, hours or days)      Yesterday  4. PATTERN: Does the pain come and go, or has it been constant since it started?  Does it get worse with exertion?      Comes and goes. Worse with exertion  5. DURATION: How long does it last (e.g., seconds, minutes, hours)     Not sure has lasted for a while  6. SEVERITY: How bad is the pain?  (e.g., Scale 1-10; mild, moderate, or severe)     Mild  7. CARDIAC RISK FACTORS: Do you have any history of heart problems or risk factors for heart disease? (e.g., angina, prior heart attack; diabetes, high blood pressure, high cholesterol, smoker, or strong family history of heart disease)     High cholesterol 8. PULMONARY RISK FACTORS: Do you have any history of lung disease?  (e.g., blood clots in lung, asthma, emphysema, birth control pills)     na 9. CAUSE: What do you think is causing the chest pain?     No  10. OTHER SYMPTOMS: Do you have any other symptoms? (e.g., dizziness, nausea, vomiting, sweating, fever, difficulty breathing, cough)       Chest pressure now mild , cough noted couple of weeks  pain with exertion, pain with breathing in , no dizziness, no  difficulty breathing, no sweating no N/V 11. PREGNANCY: Is there any chance you are pregnant? When was your last menstrual period?       na  Protocols used: Chest Pain-A-AH

## 2023-12-04 NOTE — ED Triage Notes (Signed)
 Pt caox4, ambulatory NAD c/o CP x2 days, pressure/tightness in the center of the chest radiating to the back, worsens when laying down, denies any additional s/s. Tried antiacid OTC meds with no relief. Went to [facility] [place] yesterday with labs and CXR done, but LWBS.

## 2023-12-05 ENCOUNTER — Encounter (INDEPENDENT_AMBULATORY_CARE_PROVIDER_SITE_OTHER): Payer: Self-pay

## 2023-12-09 ENCOUNTER — Encounter (HOSPITAL_COMMUNITY): Payer: Self-pay | Admitting: Licensed Clinical Social Worker

## 2023-12-09 NOTE — Progress Notes (Signed)
 Virtual Visit via Video Note  I connected with Jordan [name] on 12/03/23 at  3:30 PM EDT by a video enabled telemedicine application and verified that I am speaking with the correct person using two identifiers.  Location: Patient: ED at [facility] Provider: office   I discussed the limitations of evaluation and management by telemedicine and the availability of in person appointments. The patient expressed understanding and agreed to proceed.    I discussed the assessment and treatment plan with the patient. The patient was provided an opportunity to ask questions and all were answered. The patient agreed with the plan and demonstrated an understanding of the instructions.   The patient was advised to call back or seek an in-person evaluation if the symptoms worsen or if the condition fails to improve as anticipated.  I provided 30 minutes of non-face-to-face time during this encounter.   Harlene JONELLE Rosser, LCSW   THERAPIST PROGRESS NOTE  Session Time: 3:30pm-4:00pm  Participation Level: Active  Behavioral Response: NeatAlertIrritable  Type of Therapy: Individual Therapy  Treatment Goals addressed: Reduce frequency, intensity, and duration of depression symptoms so that daily functioning is improved   ProgressTowards Goals: Progressing  Interventions: Motivational Interviewing  Summary: Jordan [name] is a 43 y.o. female who presents with Bipolar I, depressed, mild.   Suicidal/Homicidal: Nowithout intent/plan  Therapist Response: Allycia engaged well in individual virtual session with clinician. Clinician utilized MI OARS to reflect and summarize thoughts, feelings, and interactions. Clinician explored current health status and noted pain in chest at an appointment earlier in the day, which led to a trip to ED. Clinician processed relationship and plans to get engaged. Clinician continued to encourage Jordan [name] to be present in this moment of the relationship and  wait to ensure stability before proposing. Clinician reflected the urge to settle down and commit to her partner. However, clinician also reminded Jordan [name] about challenges in the recent past that have been discouraging in the relationship. Clinician identified the importance of waiting and patience.  Plan: Return again in 2 weeks.  Diagnosis: Bipolar 1 disorder, depressed, mild (HCC)  Collaboration of Care: Other provider involved in patient's care AEB Kao was in the waiting room for the ED at [facility]. Clinician reviewed notes and received confirmation when client returned home.   Patient/Guardian was advised Release of Information must be obtained prior to any record release in order to collaborate their care with an outside provider. Patient/Guardian was advised if they have not already done so to contact the registration department to sign all necessary forms in order for us  to release information regarding their care.   Consent: Patient/Guardian gives verbal consent for treatment and assignment of benefits for services provided during this visit. Patient/Guardian expressed understanding and agreed to proceed.   Harlene JONELLE [place], LCSW 12/09/2023

## 2023-12-10 DIAGNOSIS — M545 Low back pain, unspecified: Secondary | ICD-10-CM | POA: Diagnosis not present

## 2023-12-15 ENCOUNTER — Other Ambulatory Visit (HOSPITAL_COMMUNITY): Payer: Self-pay | Admitting: Psychiatry

## 2023-12-15 DIAGNOSIS — F4310 Post-traumatic stress disorder, unspecified: Secondary | ICD-10-CM

## 2023-12-15 DIAGNOSIS — F3132 Bipolar disorder, current episode depressed, moderate: Secondary | ICD-10-CM

## 2023-12-15 DIAGNOSIS — F419 Anxiety disorder, unspecified: Secondary | ICD-10-CM

## 2023-12-16 ENCOUNTER — Encounter: Payer: Self-pay | Admitting: Neurology

## 2023-12-16 ENCOUNTER — Ambulatory Visit: Admitting: Neurology

## 2023-12-16 VITALS — BP 111/63 | HR 66 | Ht 69.0 in | Wt 288.0 lb

## 2023-12-16 DIAGNOSIS — G4719 Other hypersomnia: Secondary | ICD-10-CM | POA: Diagnosis not present

## 2023-12-16 DIAGNOSIS — G4701 Insomnia due to medical condition: Secondary | ICD-10-CM | POA: Diagnosis not present

## 2023-12-16 DIAGNOSIS — G8928 Other chronic postprocedural pain: Secondary | ICD-10-CM | POA: Diagnosis not present

## 2023-12-16 DIAGNOSIS — G8929 Other chronic pain: Secondary | ICD-10-CM | POA: Diagnosis not present

## 2023-12-16 DIAGNOSIS — R5381 Other malaise: Secondary | ICD-10-CM

## 2023-12-16 DIAGNOSIS — R5382 Chronic fatigue, unspecified: Secondary | ICD-10-CM

## 2023-12-16 MED ORDER — ALPRAZOLAM 0.5 MG PO TABS: 0.5000 mg | ORAL_TABLET | Freq: Every evening | ORAL | 0 refills | Status: AC | PRN

## 2023-12-16 MED ORDER — VITAMIN D 50 MCG (2000 UT) PO TABS: 2000.0000 [IU] | ORAL_TABLET | Freq: Every day | ORAL | Status: AC
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
## 2023-12-16 NOTE — Addendum Note (Signed)
 Addended by: CHALICE SAUNAS on: 12/16/2023 11:57 AM   Modules accepted: Orders

## 2023-12-16 NOTE — Patient Instructions (Signed)
 Quality Sleep Information, Adult Quality sleep is important for your mental and physical health. It also improves your quality of life. Quality sleep means you: Are asleep for most of the time you are in bed. Fall asleep within 30 minutes. Wake up no more than once a night. Are awake for no longer than 20 minutes if you do wake up during the night. Most adults need 7-8 hours of quality sleep each night. How can poor sleep affect me? If you do not get enough quality sleep, you may have: Mood swings. Daytime sleepiness. Decreased alertness, reaction time, and concentration. Sleep disorders, such as insomnia and sleep apnea. Difficulty with: Solving problems. Coping with stress. Paying attention. These issues may affect your performance and productivity at work, school, and home. Lack of sleep may also put you at higher risk for accidents, suicide, and risky behaviors. If you do not get quality sleep, you may also be at higher risk for several health problems, including: Infections. Type 2 diabetes. Heart disease. High blood pressure. Obesity. Worsening of long-term conditions, like arthritis, kidney disease, depression, Parkinson's disease, and epilepsy. What actions can I take to get more quality sleep? Sleep schedule and routine Stick to a sleep schedule. Go to sleep and wake up at about the same time each day. Do not try to sleep less on weekdays and make up for lost sleep on weekends. This does not work. Limit naps during the day to 30 minutes or less. Do not take naps in the late afternoon. Make time to relax before bed. Reading, listening to music, or taking a hot bath promotes quality sleep. Make your bedroom a place that promotes quality sleep. Keep your bedroom dark, quiet, and at a comfortable room temperature. Make sure your bed is comfortable. Avoid using electronic devices that give off bright blue light for 30 minutes before bedtime. Your brain perceives bright blue light  as sunlight. This includes television, phones, and computers. If you are lying awake in bed for longer than 20 minutes, get up and do a relaxing activity until you feel sleepy. Lifestyle     Try to get at least 30 minutes of exercise on most days. Do not exercise 2-3 hours before going to bed. Do not use any products that contain nicotine or tobacco. These products include cigarettes, chewing tobacco, and vaping devices, such as e-cigarettes. If you need help quitting, ask your health care provider. Do not drink caffeinated beverages for at least 8 hours before going to bed. Coffee, tea, and some sodas contain caffeine. Do not drink alcohol or eat large meals close to bedtime. Try to get at least 30 minutes of sunlight every day. Morning sunlight is best. Medical concerns Work with your health care provider to treat medical conditions that may affect sleeping, such as: Nasal obstruction. Snoring. Sleep apnea and other sleep disorders. Talk to your health care provider if you think any of your prescription medicines may cause you to have difficulty falling or staying asleep. If you have sleep problems, talk with a sleep consultant. If you think you have a sleep disorder, talk with your health care provider about getting evaluated by a specialist. Where to find more information Sleep Foundation: sleepfoundation.org American Academy of Sleep Medicine: aasm.org Centers for Disease Control and Prevention (CDC): tonerpromos.no Contact a health care provider if: You have trouble getting to sleep or staying asleep. You often wake up very early in the morning and cannot get back to sleep. You have daytime sleepiness. You  have daytime sleep attacks of suddenly falling asleep and sudden muscle weakness (narcolepsy). You have a tingling sensation in your legs with a strong urge to move your legs (restless legs syndrome). You stop breathing briefly during sleep (sleep apnea). You think you have a sleep  disorder or are taking a medicine that is affecting your quality of sleep. Summary Most adults need 7-8 hours of quality sleep each night. Getting enough quality sleep is important for your mental and physical health. Make your bedroom a place that promotes quality sleep, and avoid things that may cause you to have poor sleep, such as alcohol, caffeine, smoking, or large meals. Talk to your health care provider if you have trouble falling asleep or staying asleep. This information is not intended to replace advice given to you by your health care provider. Make sure you discuss any questions you have with your health care provider. Document Revised: 05/31/2021 Document Reviewed: 05/31/2021 Elsevier Patient Education  2024 Else[employer] Inc.  Insomnia Insomnia is a sleep disorder that makes it difficult to fall asleep or stay asleep. Insomnia can cause fatigue, low energy, difficulty concentrating, mood swings, and poor performance at work or school. There are three different ways to classify insomnia: Difficulty falling asleep. Difficulty staying asleep. Waking up too early in the morning. Any type of insomnia can be long-term (chronic) or short-term (acute). Both are common. Short-term insomnia usually lasts for 3 months or less. Chronic insomnia occurs at least three times a week for longer than 3 months. What are the causes? Insomnia may be caused by another condition, situation, or substance, such as: Having certain mental health conditions, such as anxiety and depression. Using caffeine, alcohol, tobacco, or drugs. Having gastrointestinal conditions, such as gastroesophageal reflux disease (GERD). Having certain medical conditions. These include: Asthma. Alzheimer's disease. Stroke. Chronic pain. An overactive thyroid  gland (hyperthyroidism). Other sleep disorders, such as restless legs syndrome and sleep apnea. Menopause. Sometimes, the cause of insomnia may not be known. What  increases the risk? Risk factors for insomnia include: Gender. Females are affected more often than males. Age. Insomnia is more common as people get older. Stress and certain medical and mental health conditions. Lack of exercise. Having an irregular work schedule. This may include working night shifts and traveling between different time zones. What are the signs or symptoms? If you have insomnia, the main symptom is having trouble falling asleep or having trouble staying asleep. This may lead to other symptoms, such as: Feeling tired or having low energy. Feeling nervous about going to sleep. Not feeling rested in the morning. Having trouble concentrating. Feeling irritable, anxious, or depressed. How is this diagnosed? This condition may be diagnosed based on: Your symptoms and medical history. Your health care provider may ask about: Your sleep habits. Any medical conditions you have. Your mental health. A physical exam. How is this treated? Treatment for insomnia depends on the cause. Treatment may focus on treating an underlying condition that is causing the insomnia. Treatment may also include: Medicines to help you sleep. Counseling or therapy. Lifestyle adjustments to help you sleep better. Follow these instructions at home: Eating and drinking  Limit or avoid alcohol, caffeinated beverages, and products that contain nicotine and tobacco, especially close to bedtime. These can disrupt your sleep. Do not eat a large meal or eat spicy foods right before bedtime. This can lead to digestive discomfort that can make it hard for you to sleep. Sleep habits  Keep a sleep diary to help you  and your health care provider figure out what could be causing your insomnia. Write down: When you sleep. When you wake up during the night. How well you sleep and how rested you feel the next day. Any side effects of medicines you are taking. What you eat and drink. Make your bedroom a  dark, comfortable place where it is easy to fall asleep. Put up shades or blackout curtains to block light from outside. Use a white noise machine to block noise. Keep the temperature cool. Limit screen use before bedtime. This includes: Not watching TV. Not using your smartphone, tablet, or computer. Stick to a routine that includes going to bed and waking up at the same times every day and night. This can help you fall asleep faster. Consider making a quiet activity, such as reading, part of your nighttime routine. Try to avoid taking naps during the day so that you sleep better at night. Get out of bed if you are still awake after 15 minutes of trying to sleep. Keep the lights down, but try reading or doing a quiet activity. When you feel sleepy, go back to bed. General instructions Take over-the-counter and prescription medicines only as told by your health care provider. Exercise regularly as told by your health care provider. However, avoid exercising in the hours right before bedtime. Use relaxation techniques to manage stress. Ask your health care provider to suggest some techniques that may work well for you. These may include: Breathing exercises. Routines to release muscle tension. Visualizing peaceful scenes. Make sure that you drive carefully. Do not drive if you feel very sleepy. Keep all follow-up visits. This is important. Contact a health care provider if: You are tired throughout the day. You have trouble in your daily routine due to sleepiness. You continue to have sleep problems, or your sleep problems get worse. Get help right away if: You have thoughts about hurting yourself or someone else. Get help right away if you feel like you may hurt yourself or others, or have thoughts about taking your own life. Go to your nearest emergency room or: Call 911. Call the National Suicide Prevention Lifeline at 3193415394 or 988. This is open 24 hours a day. Text the Crisis  Text Line at 814 202 5967. Summary Insomnia is a sleep disorder that makes it difficult to fall asleep or stay asleep. Insomnia can be long-term (chronic) or short-term (acute). Treatment for insomnia depends on the cause. Treatment may focus on treating an underlying condition that is causing the insomnia. Keep a sleep diary to help you and your health care provider figure out what could be causing your insomnia. This information is not intended to replace advice given to you by your health care provider. Make sure you discuss any questions you have with your health care provider. Document Revised: 01/16/2021 Document Reviewed: 01/16/2021 Elsevier Patient Education  2024 Elsevier Inc.   Dear Dr Berkeley,  Thank you for sending Jordan [name] my way today:  As you know,  risk factors for OSA were present,  including : Body mass index is 42.53 kg/m.,  16  neck size and  crossbite, large tongue ( functional macroglossia)  and upper airway anatomy. Co morbidities of pre diabetes, and observed/ witnessed snoring and puffing  for air were reported.    1)  The patient can benefit from apnea screening  by PSG or HST test.  2)  The needed weight loss is addressed by weight and wellness clinic.  It is possible that  a diagnosis of moderate to severe sleep apnea can help to obtain coverage for FDA approved Zepbound.  3) The patient is reportedly in chronic pain , leading to sleep time restriction and fragmentation. PT has helped her to get better rest and better sleep.  4)  sleep habits/ sleep hygiene were addressed, TV out of the bedroom,  caffeine is used in moderation and before noon. 5) she has occasionally felt too sleepy to drive and then will not drive.     My Plan is to proceed with:  HST/ PSG/ 1) possible modafinil use if sleepiness remains high.  2) possible zepbound use through weight and wellness   If apnea is diagnosed , will address the appropriate therapy for her .  I plan to follow up on  the sleep study personally  or through our NP within 5 months.   A total time of  50  minutes consistent of a part of face to face encounter , exam and interview,  and additional preparation time for chart review was spent .  At today's visit, we discussed treatment options, associated risk and benefits, and engage in counseling as needed including, but not limited to:  Sleep hygiene, Quality Sleep Habits, and Safety concerns for patients with daytime sleepiness who are warned to not operate machinery/ motor vehicles when drowsy. Risk factors for sleep apnea were identified:  Body mass index is 42.53 kg/m..  Large neck, small airway.  Additionally, the following were reviewed: Past medical records, past medical and surgical history, family and social background, as well as relevant laboratory results, imaging findings, and medical notes, where applicable.  This note was generated by myself in part by using dictation software, and as a result, it may contain unintentional typos and errors.  Nevertheless, effort was made to accurately convey the pertinent aspects of the patient's visit.

## 2023-12-17 ENCOUNTER — Ambulatory Visit (INDEPENDENT_AMBULATORY_CARE_PROVIDER_SITE_OTHER): Admitting: Family Medicine

## 2023-12-17 ENCOUNTER — Ambulatory Visit (HOSPITAL_COMMUNITY): Admitting: Licensed Clinical Social Worker

## 2023-12-17 ENCOUNTER — Encounter (INDEPENDENT_AMBULATORY_CARE_PROVIDER_SITE_OTHER): Payer: Self-pay | Admitting: Family Medicine

## 2023-12-17 VITALS — BP 120/87 | HR 74 | Temp 98.7°F | Ht 69.0 in | Wt 284.0 lb

## 2023-12-17 DIAGNOSIS — R7303 Prediabetes: Secondary | ICD-10-CM | POA: Diagnosis not present

## 2023-12-17 DIAGNOSIS — E7800 Pure hypercholesterolemia, unspecified: Secondary | ICD-10-CM

## 2023-12-17 DIAGNOSIS — E559 Vitamin D deficiency, unspecified: Secondary | ICD-10-CM | POA: Diagnosis not present

## 2023-12-17 DIAGNOSIS — Z6841 Body Mass Index (BMI) 40.0 and over, adult: Secondary | ICD-10-CM

## 2023-12-17 MED ORDER — METFORMIN HCL 500 MG PO TABS: 500.0000 mg | ORAL_TABLET | Freq: Every day | ORAL | 0 refills | Status: DC

## 2023-12-17 MED ORDER — VITAMIN D (ERGOCALCIFEROL) 1.25 MG (50000 UNIT) PO CAPS: 50000.0000 [IU] | ORAL_CAPSULE | ORAL | 0 refills | Status: DC

## 2023-12-17 NOTE — Progress Notes (Signed)
 SUBJECTIVE:  Chief Complaint: Obesity  Interim History: patient has been trying to stay more adherent to the meal plan over the last few weeks.  She was logging her food in [employer].  That app is giving patient allotted macros of only 20% protein.  She has her girlfriend's birthday coming up and they are going to [facility] for a bar crawl and costume contest.  She is hoping to go to her girlfriend's mother's house for Thanksgiving.   Jordan [name] is here to discuss her progress with her obesity treatment plan. She is on the Category 2 Plan and states she is following her eating plan approximately 25 % of the time. She states she is not exercising 0 minutes 0 times per week.   OBJECTIVE: Visit Diagnoses: Problem List Items Addressed This Visit       Other   Hyperlipidemia (Chronic)   Prediabetes - Primary   Relevant Medications   metFORMIN (GLUCOPHAGE) 500 MG tablet   Other Visit Diagnoses       Vitamin D deficiency       Relevant Medications   Vitamin D, Ergocalciferol, (DRISDOL) 1.25 MG (50000 UNIT) CAPS capsule     Morbid obesity (HCC)       Relevant Medications   metFORMIN (GLUCOPHAGE) 500 MG tablet     BMI 40.0-44.9, adult (HCC)       Relevant Medications   metFORMIN (GLUCOPHAGE) 500 MG tablet       Vitals Temp: 98.7 F (37.1 C) BP: 120/87 Pulse Rate: 74 SpO2: 99 %   Anthropometric Measurements Height: 5' 9 (1.753 m) Weight: 284 lb (128.8 kg) BMI (Calculated): 41.92 Weight at Last Visit: 281lb Weight Lost Since Last Visit: 0lb Weight Gained Since Last Visit: 3lb Starting Weight: 280lb Total Weight Loss (lbs): 0 lb (0 kg)   Body Composition  Body Fat %: 49.3 % Fat Mass (lbs): 140.2 lbs Muscle Mass (lbs): 137 lbs Total Body Water  (lbs): 96.8 lbs Visceral Fat Rating : 14   Other Clinical Data RMR: [id number] Fasting: No Labs: No Today's Visit #: 3 Starting Date: 11/19/23     ASSESSMENT AND PLAN: Assessment & Plan Prediabetes Pathophysiology of  progression through insulin  resistance to prediabetes and diabetes was discussed at length today.  Patient to continue to monitor and be in control of total intake of snack calories which may be simple carbohydrates but should be consumed only after the patient has taken in all the nutrition for the day.  Macronutrient identification, classification and daily intake ratios were discussed.  Plan to repeat labs in 3 months to monitor both hemoglobin A1c and insulin  levels.  No medications at this time as patient is not having significant hunger or cravings that would make following meal plan more difficult.    Vitamin D deficiency Discussed importance of vitamin d supplementation.  Vitamin d supplementation has been shown to decrease fatigue, decrease risk of progression to insulin  resistance and then prediabetes, decreases risk of falling in older age and can even assist in decreasing depressive symptoms in PTSD.   Prescription for Vitamin D sent in.   Pure hypercholesterolemia The 10-year ASCVD risk score (Arnett DK, et al., 2019) is: 0.4%   Values used to calculate the score:     Age: 43 years     Clincally relevant sex: Female     Is Non-Hispanic African American: Yes     Diabetic: No     Tobacco smoker: No     Systolic Blood Pressure: 120 mmHg  Is BP treated: No     HDL Cholesterol: 77 mg/dL     Total Cholesterol: 232 mg/dL Discussed importance of limiting saturated fat intake to less than 20% of her daily intake.  Will need to repeat fasting lipid panel in 4 months.  No medication needed at this time but patient to implement lifestyle modifications. Morbid obesity (HCC)  BMI 40.0-44.9, adult (HCC)    Diet: Jordan [name] is currently in the action stage of change. As such, her goal is to continue with weight loss efforts and has agreed to keeping a food journal and adhering to recommended goals of 1200-1300 calories and 85 or more grams of protein. Patient to start food log or journaling meal  plan.  The initial goal will be to habitually log or journal for at least 4 days a week.  The expectation it that patient may not initially meet calorie or protein goals as the nturitional understanding of food intake is begun.  We discussed the 10:1 ratio when reading a food label.  Patient agrees to keep a food log either electronically or on paper and bring to the next appointment to be able to dissect and discuss it with provider.    Exercise:  For substantial health benefits, adults should do at least 150 minutes (2 hours and 30 minutes) a week of moderate-intensity, or 75 minutes (1 hour and 15 minutes) a week of vigorous-intensity aerobic physical activity, or an equivalent combination of moderate- and vigorous-intensity aerobic activity. Aerobic activity should be performed in episodes of at least 10 minutes, and preferably, it should be spread throughout the week.  Behavior Modification:  We discussed the following Behavioral Modification Strategies today: increasing lean protein intake, decreasing simple carbohydrates, increasing vegetables, meal planning and cooking strategies, and planning for success.   Return in about 3 weeks (around 01/07/2024).   She was informed of the importance of frequent follow up visits to maximize her success with intensive lifestyle modifications for her multiple health conditions.  Attestation Statements:   Reviewed by clinician on day of visit: allergies, medications, problem list, medical history, surgical history, family history, social history, and previous encounter notes.   Jordan Cho, MD

## 2023-12-17 NOTE — Assessment & Plan Note (Addendum)
 The 10-year ASCVD risk score (Arnett DK, et al., 2019) is: 0.4%   Values used to calculate the score:     Age: 43 years     Clincally relevant sex: Female     Is Non-Hispanic African American: Yes     Diabetic: No     Tobacco smoker: No     Systolic Blood Pressure: 120 mmHg     Is BP treated: No     HDL Cholesterol: 77 mg/dL     Total Cholesterol: 232 mg/dL Discussed importance of limiting saturated fat intake to less than 20% of her daily intake.  Will need to repeat fasting lipid panel in 4 months.  No medication needed at this time but patient to implement lifestyle modifications.

## 2023-12-17 NOTE — Assessment & Plan Note (Signed)

## 2023-12-19 ENCOUNTER — Ambulatory Visit (INDEPENDENT_AMBULATORY_CARE_PROVIDER_SITE_OTHER): Admitting: Licensed Clinical Social Worker

## 2023-12-19 ENCOUNTER — Encounter (HOSPITAL_COMMUNITY): Payer: Self-pay | Admitting: Licensed Clinical Social Worker

## 2023-12-19 DIAGNOSIS — F3131 Bipolar disorder, current episode depressed, mild: Secondary | ICD-10-CM | POA: Diagnosis not present

## 2023-12-19 NOTE — Progress Notes (Signed)
 Virtual Visit via Video Note  I connected with MERARI PION on 12/19/23 at 12:30 PM EDT by a video enabled telemedicine application and verified that I am speaking with the correct person using two identifiers.  Location: Patient: school Provider: home office   I discussed the limitations of evaluation and management by telemedicine and the availability of in person appointments. The patient expressed understanding and agreed to proceed.   I discussed the assessment and treatment plan with the patient. The patient was provided an opportunity to ask questions and all were answered. The patient agreed with the plan and demonstrated an understanding of the instructions.   The patient was advised to call back or seek an in-person evaluation if the symptoms worsen or if the condition fails to improve as anticipated.  I provided 45 minutes of non-face-to-face time during this encounter.   Harlene JONELLE Rosser, LCSW   THERAPIST PROGRESS NOTE  Session Time: 12:30pm-1:15pm  Participation Level: Active  Behavioral Response: NeatAlertEuthymic  Type of Therapy: Individual Therapy  Treatment Goals addressed: Reduce frequency, intensity, and duration of depression symptoms so that daily functioning is improved   ProgressTowards Goals: Progressing  Interventions: CBT  Summary: SAANVIKA VAZQUES is a 43 y.o. female who presents with Bipolar I, depressed, mild.   Suicidal/Homicidal: Nowithout intent/plan  Therapist Response: Karishma engaged well in virtual individual session with clinician. Clinician utilized CBT to process thoughts, feelings, and interactions. Clinician explored health status and noted new concerns about pre-diabetes and chest pain. Clinician processed Dessire's plans to manage her health and to get back into the gym. Clinician explored her motivation levels and ways to engage her family to get healthier.  Clinician explored updates in relationships with family members. Savina  shared increased support from partner, which has been very helpful to Tennyson. Clinician also noted some increased support from nieces, which has been surprising and welcomed.   Plan: Return again in 4 weeks.  Diagnosis: Bipolar 1 disorder, depressed, mild (HCC)  Collaboration of Care: Patient refused AEB none required  Patient/Guardian was advised Release of Information must be obtained prior to any record release in order to collaborate their care with an outside provider. Patient/Guardian was advised if they have not already done so to contact the registration department to sign all necessary forms in order for us  to release information regarding their care.   Consent: Patient/Guardian gives verbal consent for treatment and assignment of benefits for services provided during this visit. Patient/Guardian expressed understanding and agreed to proceed.   Harlene JONELLE [place], LCSW 12/19/2023

## 2023-12-27 ENCOUNTER — Telehealth (HOSPITAL_COMMUNITY): Admitting: Psychiatry

## 2023-12-30 ENCOUNTER — Telehealth (HOSPITAL_COMMUNITY): Admitting: Psychiatry

## 2023-12-30 ENCOUNTER — Encounter (HOSPITAL_COMMUNITY): Payer: Self-pay | Admitting: Psychiatry

## 2023-12-30 VITALS — Wt 284.0 lb

## 2023-12-30 DIAGNOSIS — F3132 Bipolar disorder, current episode depressed, moderate: Secondary | ICD-10-CM | POA: Diagnosis not present

## 2023-12-30 DIAGNOSIS — F4310 Post-traumatic stress disorder, unspecified: Secondary | ICD-10-CM

## 2023-12-30 DIAGNOSIS — F419 Anxiety disorder, unspecified: Secondary | ICD-10-CM | POA: Diagnosis not present

## 2023-12-30 MED ORDER — HYDROXYZINE HCL 10 MG PO TABS
10.0000 mg | ORAL_TABLET | Freq: Every day | ORAL | 2 refills | Status: AC | PRN
Start: 2023-12-30 — End: ?

## 2023-12-30 MED ORDER — DIVALPROEX SODIUM ER 500 MG PO TB24: 1000.0000 mg | ORAL_TABLET | Freq: Every day | ORAL | 2 refills | Status: AC
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
## 2024-01-08 ENCOUNTER — Ambulatory Visit

## 2024-01-08 ENCOUNTER — Ambulatory Visit (INDEPENDENT_AMBULATORY_CARE_PROVIDER_SITE_OTHER): Payer: Self-pay | Admitting: Family Medicine

## 2024-01-08 ENCOUNTER — Encounter (INDEPENDENT_AMBULATORY_CARE_PROVIDER_SITE_OTHER): Payer: Self-pay | Admitting: Family Medicine

## 2024-01-08 DIAGNOSIS — Z6841 Body Mass Index (BMI) 40.0 and over, adult: Secondary | ICD-10-CM

## 2024-01-08 DIAGNOSIS — E7800 Pure hypercholesterolemia, unspecified: Secondary | ICD-10-CM

## 2024-01-08 DIAGNOSIS — R7303 Prediabetes: Secondary | ICD-10-CM | POA: Diagnosis not present

## 2024-01-08 MED ORDER — METFORMIN HCL 500 MG PO TABS: 500.0000 mg | ORAL_TABLET | Freq: Every day | ORAL | 0 refills | Status: DC

## 2024-01-08 NOTE — Assessment & Plan Note (Signed)
 Tolerating metformin with no GI side effects.  Is noticing improvement in carbohydrate cravings and intake with metformin usage.  Refill metformin at current dose and 90 day supply sent into pharmacy.

## 2024-01-08 NOTE — Progress Notes (Signed)
 SUBJECTIVE:  Chief Complaint: Obesity  Interim History: patient has gotten more consistently on plan and walking three days a week.  She has stayed within her calorie range and has had one day of eating outside of the plan.  No hunger or cravings.  Able to get all protein quantity in.  She is craving chocolate more consistently due to menstrual cycle.  No sure what she is doing for Thanksgiving yet.  Originally she was going to go to [place]  to visit her partner's mother who was in a rehab facility.  Her partner is currently in the ED for evaluation of possible strangulated hernia. Prior plan was that her partner would have hernia surgery in December but not sure if that will happen earlier now.    Jordan [name] is here to discuss her progress with her obesity treatment plan. She is on the keeping a food journal and adhering to recommended goals of 1200-1300 calories and 85 grams of protein and states she is following her eating plan approximately 60 % of the time. She states she is walking and biking 45 minutes 3 times per week.   OBJECTIVE: Visit Diagnoses: Problem List Items Addressed This Visit       Other   Prediabetes   Relevant Medications   metFORMIN (GLUCOPHAGE) 500 MG tablet   Other Visit Diagnoses       Morbid obesity (HCC)    -  Primary   Relevant Medications   metFORMIN (GLUCOPHAGE) 500 MG tablet     BMI 40.0-44.9, adult (HCC)       Relevant Medications   metFORMIN (GLUCOPHAGE) 500 MG tablet       Vitals Temp: (!) 97.4 F (36.3 C) BP: 93/66 Pulse Rate: 62 SpO2: 100 %   Anthropometric Measurements Height: 5' 9 (1.753 m) Weight: 275 lb (124.7 kg) BMI (Calculated): 40.59 Weight at Last Visit: 284 lb Weight Lost Since Last Visit: 9 lb Starting Weight: 280 lb Total Weight Loss (lbs): 5 lb (2.268 kg)   Body Composition  Body Fat %: 48.6 % Fat Mass (lbs): 133.8 lbs Muscle Mass (lbs): 134.2 lbs Total Body Water  (lbs): 98.4 lbs Visceral Fat Rating :  14   Other Clinical Data Today's Visit #: 4 Starting Date: 11/19/23 Comments: 1200-1300/85     ASSESSMENT AND PLAN: Assessment & Plan Prediabetes Tolerating metformin with no GI side effects.  Is noticing improvement in carbohydrate cravings and intake with metformin usage.  Refill metformin at current dose and 90 day supply sent into pharmacy. Pure hypercholesterolemia Previously elevated LDL but HDL above goal significantly.  Will continue to work on dietary changes to limit saturated fat to less than 20% of total daily intake. Morbid obesity (HCC)  BMI 40.0-44.9, adult (HCC)    Diet: Jordan [name] is currently in the action stage of change. As such, her goal is to continue with weight loss efforts and has agreed to keeping a food journal and adhering to recommended goals of 1200-1300 calories and 85 or more grams of protein daily.   Exercise:  For substantial health benefits, adults should do at least 150 minutes (2 hours and 30 minutes) a week of moderate-intensity, or 75 minutes (1 hour and 15 minutes) a week of vigorous-intensity aerobic physical activity, or an equivalent combination of moderate- and vigorous-intensity aerobic activity. Aerobic activity should be performed in episodes of at least 10 minutes, and preferably, it should be spread throughout the week.  Behavior Modification:  We discussed the following Behavioral Modification Strategies today:  increasing lean protein intake, decreasing simple carbohydrates, increasing vegetables, meal planning and cooking strategies, holiday eating strategies, and keep a strict food journal.   Return in about 3 weeks (around 01/29/2024).   She was informed of the importance of frequent follow up visits to maximize her success with intensive lifestyle modifications for her multiple health conditions.  Attestation Statements:   Reviewed by clinician on day of visit: allergies, medications, problem list, medical history, surgical  history, family history, social history, and previous encounter notes.     [name], MD

## 2024-01-08 NOTE — Assessment & Plan Note (Signed)
 Previously elevated LDL but HDL above goal significantly.  Will continue to work on dietary changes to limit saturated fat to less than 20% of total daily intake.

## 2024-01-09 ENCOUNTER — Other Ambulatory Visit: Payer: Self-pay

## 2024-01-09 ENCOUNTER — Ambulatory Visit

## 2024-01-09 ENCOUNTER — Telehealth: Payer: Self-pay | Admitting: Neurology

## 2024-01-09 VITALS — BP 102/57 | HR 66 | Temp 98.1°F | Resp 20 | Ht 69.0 in | Wt 285.4 lb

## 2024-01-09 DIAGNOSIS — Z0000 Encounter for general adult medical examination without abnormal findings: Secondary | ICD-10-CM

## 2024-01-09 DIAGNOSIS — Z23 Encounter for immunization: Secondary | ICD-10-CM

## 2024-01-09 DIAGNOSIS — Z8249 Family history of ischemic heart disease and other diseases of the circulatory system: Secondary | ICD-10-CM | POA: Diagnosis not present

## 2024-01-09 DIAGNOSIS — R030 Elevated blood-pressure reading, without diagnosis of hypertension: Secondary | ICD-10-CM

## 2024-01-09 NOTE — Progress Notes (Signed)
 Patient name: Jordan [name] Date of birth: 1980-04-15 Date of visit: 01/09/24  Type of visit: Established Patient Office Visit   Subjective   Chief concern:  Chief Complaint  Patient presents with   Follow-up    B/P high at Physical Therapy    Jordan [name] is a 43 y.o. female with a history of Bipolar disorder, HLD, GERD, fibromyalgia, and obesity who presents to [facility] for evaluation of elevated blood pressure reading during PT session.  Patient reports that she was in outpatient physical therapy which she has been going to at [facility] to rehab her left ankle following an ankle sprain earlier this year, and the physical therapist checked her blood pressure which was reading higher than usual around 140/90.  At that time patient denied any headache or vision changes, but her physical therapist wanted her to have a follow-up to check on this.  Patient has a long history of soft-normal blood pressure readings, so the 140/90 was unusual for her.  She does not take any blood pressure medication.  She is seeing the weight management clinic who is helping her with dietary and lifestyle changes, and she reports good adherence to that program and has lost over 10 pounds since she began.  Today she denies any headaches or vision changes since her PT visit and she is feeling in her normal state of health and feeling very well.  ROS: Negative unless otherwise stated in HPI.   Patient Active Problem List   Diagnosis Date Noted   Excessive daytime sleepiness 12/16/2023   Other chronic postprocedural pain 12/16/2023   Prediabetes 11/19/2023   Cough 11/18/2023   Polydipsia 06/18/2023   Breast mass, right 03/14/2022   Reactive airway disease 11/29/2021   Seasonal allergic conjunctivitis 05/31/2021   Venous insufficiency 05/31/2021   Preventative health care 10/03/2020   Fibroids, submucosal 08/23/2020   Chronic fatigue and malaise 08/11/2020   Lipoma of skin and subcutaneous  tissue of neck 08/11/2020   Hyperlipidemia 05/23/2020   Iron  deficiency 10/22/2019   Menorrhagia with regular cycle 02/09/2019   Right knee pain 11/30/2016   Post traumatic stress disorder (PTSD) 11/09/2015   Bipolar disorder (HCC) 07/04/2015   Fibromyalgia 06/26/2010   Obesity 01/17/2010    Class: Chronic   Breast pain 07/12/2008   Lumbar disc herniation of L5-S1 on the left side (MRI 2012 s/p surgery)  02/26/2008   Gastroesophageal reflux disease 03/04/2006   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     Past Surgical History:  Procedure Laterality Date   CHOLECYSTECTOMY  1999   colonscopy  07/2020   f/u in 5 yrs   HYSTEROSCOPY N/A 10/04/2020   Procedure: DIAGNOSTIC HYSTEROSCOPY;  Surgeon: Zina Jerilynn LABOR, MD;  Location: [facility];  Service: Gynecology;  Laterality: N/A;   LEFT OOPHORECTOMY     age 23   LIPOMA EXCISION Left 03/02/2021   Procedure: FpPWNM EXCISION LIPOMA;  Surgeon: Tanda Locus, MD;  Location: [facility];  Service: General;  Laterality: Left;   LUMBAR LAMINECTOMY/DECOMPRESSION MICRODISCECTOMY N/A 08/14/2012   Procedure: LUMBAR LAMINECTOMY/DECOMPRESSION MICRODISCECTOMY Lumbar 5 -sacrum 1 decompression;  Surgeon: Oneil Rodgers Priestly, MD;  Location: [facility];  Service: Orthopedics;  Laterality: N/A;  Lumbar 5 -sacrum 1 decompression   MANDIBLE RECONSTRUCTION  1992   upper anfd lower jaw cyst   NASAL SINUS SURGERY     X 2 at age 43 & 33     Current Outpatient Medications  Medication Instructions  ALPRAZolam  (XANAX ) 0.5 mg, Oral, At bedtime PRN   Ascorbic Acid (VITAMIN C GUMMIE PO) Take by mouth.   diclofenac  Sodium (VOLTAREN ) 1 % GEL APPLY 4 G TOPICALLY 4 TIMES DAILY   divalproex  (DEPAKOTE  ER) 1,000 mg, Oral, Daily   fluticasone  (FLONASE ) 50 MCG/ACT nasal spray For the first week do 2 sprays in each nostril and then afterwards do 1-2 sprays in each nostril   hydrOXYzine  (ATARAX ) 10-20 mg, Oral, Daily PRN   metFORMIN  (GLUCOPHAGE) 500 mg, Oral, Daily with breakfast   Multiple Vitamins-Minerals (ADULT ONE DAILY GUMMIES PO) 2 tablets, Daily   naproxen (NAPROSYN) 500 mg, Oral, 2 times daily   pregabalin  (LYRICA ) 150 mg, Oral, 2 times daily   promethazine -dextromethorphan (PROMETHAZINE -DM) 6.25-15 MG/5ML syrup 5 mLs, Oral, 4 times daily PRN   Vitamin D (Ergocalciferol) (DRISDOL) 50,000 Units, Oral, Every 7 days   Vitamin D 2,000 Units, Oral, Daily    Social History   Tobacco Use   Smoking status: Never   Smokeless tobacco: Never  Vaping Use   Vaping status: Never Used  Substance Use Topics   Alcohol use: Yes    Comment: occ beer   Drug use: No      Objective  Today's Vitals   01/09/24 1457 01/09/24 1500  BP: (!) 102/57   Pulse: 66   Resp: 20   Temp: 98.1 F (36.7 C)   TempSrc: Oral   SpO2: 100%   Weight: 285 lb 6.4 oz (129.5 kg)   Height: 5' 9 (1.753 m)   PainSc: 2  2   PainLoc: Ankle   Body mass index is 42.15 kg/m.   Physical Exam:   Constitutional: well-appearing female sitting in exam chair, in no acute distress. Ambulates without use of assistance device  HEENT: normocephalic atraumatic, mucous membranes moist Eyes: conjunctiva non-erythematous Cardiovascular: regular rate and rhythm, bilateral radial pulses 2+, bilateral dorsal pedal pulses 2+, brisk capillary refill bilateral feet and hands  Pulmonary/Chest: normal work of breathing on room air, lungs clear to auscultation bilaterally Abdominal: soft, non-tender, non-distended MSK: normal bulk and tone. Neurological: alert & oriented x 3 Skin: warm and dry Psych: mood calm, behavior normal, thought content normal, judgement normal      The 10-year ASCVD risk score (Arnett DK, et al., 2019) is: 0.2%   Values used to calculate the score:     Age: 71 years     Clincally relevant sex: Female     Is Non-Hispanic African American: Yes     Diabetic: No     Tobacco smoker: No     Systolic Blood Pressure: 102 mmHg     Is  BP treated: No     HDL Cholesterol: 77 mg/dL     Total Cholesterol: 232 mg/dL      Assessment & Plan  Problem List Items Addressed This Visit       Other   Preventative health care - Primary   Patient was referred to the clinic by her physical therapist following an elevated blood pressure reading of 140/90 during one of their sessions.  Today in the office patient's blood pressure is 102/57, and she has many visits with soft-normal blood pressure readings.  At the time of the elevated reading, she denied any headache or vision changes and felt in her normal state of health.  She denies any since headaches or vision changes and feels in her normal state of health today.  Her last CMP was 10/2023 which was completely normal, so do  not feel that her soft pressures are due to hepatic reasons.  She is working on dietary and exercise changes in her life and is going to the weight management clinic which she reports good adherence to and has lost over 10 pounds since she began.  She does have a blood pressure cuff at home, and I discussed with her that if she does develop any headaches or vision changes to check her blood pressure and if she does have any elevated readings to write them down and send us  a message.  At this time I am not concerned by the one-off elevated blood pressure.  She was also given her annual flu shot today.      Other Visit Diagnoses       Encounter for immunization       Relevant Orders   Flu vaccine trivalent PF, 6mos and older(Flulaval,Afluria,Fluarix,Fluzone) (Completed)       Return in about 7 months (around 08/08/2024) for annual .  Patient discussed with Dr. Francesco, who also saw and evaluated the patient.  Doyal Miyamoto, MD [facility]  PGY-1 01/09/2024, 3:46 PM

## 2024-01-09 NOTE — Telephone Encounter (Signed)
 01/09/24 sent mychart EE  12/17/23 NPS[place] [employer] medicare/medicaid no auth req EE

## 2024-01-09 NOTE — Assessment & Plan Note (Signed)
 Patient was referred to the clinic by her physical therapist following an elevated blood pressure reading of 140/90 during one of their sessions.  Today in the office patient's blood pressure is 102/57, and she has many visits with soft-normal blood pressure readings.  At the time of the elevated reading, she denied any headache or vision changes and felt in her normal state of health.  She denies any since headaches or vision changes and feels in her normal state of health today.  Her last CMP was 10/2023 which was completely normal, so do not feel that her soft pressures are due to hepatic reasons.  She is working on dietary and exercise changes in her life and is going to the weight management clinic which she reports good adherence to and has lost over 10 pounds since she began.  She does have a blood pressure cuff at home, and I discussed with her that if she does develop any headaches or vision changes to check her blood pressure and if she does have any elevated readings to write them down and send us  a message.  At this time I am not concerned by the one-off elevated blood pressure.  She was also given her annual flu shot today.

## 2024-01-09 NOTE — Patient Instructions (Signed)
 Thank you, [name] for allowing us  to provide your care today. Today we discussed the following:  - Your blood pressure looks great! We do not need to begin any medications at this time - Keep up the great work in the weight management clinic and your dietary and lifestyle changes!     Follow up: for your next annual visit    Remember:   Should you have any questions or concerns please call the Internal Medicine Clinic at [phone].     Doyal Miyamoto, MD [facility]

## 2024-01-11 NOTE — Progress Notes (Signed)
 Internal Medicine Clinic Attending  I was physically present during the key portions of the resident provided service and participated in the medical decision making of patient's management care. I reviewed pertinent patient test results.  The assessment, diagnosis, and plan were formulated together and I agree with the documentation in the resident's note.  Francesco Elsie NOVAK, MD

## 2024-01-15 ENCOUNTER — Encounter (HOSPITAL_COMMUNITY): Payer: Self-pay | Admitting: Licensed Clinical Social Worker

## 2024-01-15 ENCOUNTER — Ambulatory Visit (INDEPENDENT_AMBULATORY_CARE_PROVIDER_SITE_OTHER): Admitting: Licensed Clinical Social Worker

## 2024-01-15 DIAGNOSIS — F3131 Bipolar disorder, current episode depressed, mild: Secondary | ICD-10-CM

## 2024-01-15 NOTE — Progress Notes (Signed)
 Virtual Visit via Video Note  I connected with SUVI ARCHULETTA on 01/15/24 at  1:30 PM EST by a video enabled telemedicine application and verified that I am speaking with the correct person using two identifiers.  Location: Patient: home Provider: home office   I discussed the limitations of evaluation and management by telemedicine and the availability of in person appointments. The patient expressed understanding and agreed to proceed.  I discussed the assessment and treatment plan with the patient. The patient was provided an opportunity to ask questions and all were answered. The patient agreed with the plan and demonstrated an understanding of the instructions.   The patient was advised to call back or seek an in-person evaluation if the symptoms worsen or if the condition fails to improve as anticipated.  I provided 45 minutes of non-face-to-face time during this encounter.   Harlene JONELLE Rosser, LCSW   THERAPIST PROGRESS NOTE  Session Time: 1:30pm-2:15pm  Participation Level: Active  Behavioral Response: Well GroomedAlertEuthymic  Type of Therapy: Individual Therapy  Treatment Goals addressed: Reduce frequency, intensity, and duration of depression symptoms so that daily functioning is improved     ProgressTowards Goals: Progressing  Interventions: CBT  Summary: KEREN ALVERIO is a 43 y.o. female who presents with Bipolar Disorder, depressed, mild.   Suicidal/Homicidal: Nowithout intent/plan  Therapist Response: Olayinka engaged well in individual virtual session with clinician. Clinician utilized CBT to process thoughts, feelings, and behaviors. Clinician explored relationship with partner and identified one challenging situation where her partner began questioning [name]'s commitment. Clinician processed thoughts and feelings, noting that [facility] loyalty and communication in her relationships. Clinician explored Tyeshia's reaction and noted that coping skills  were used. Lyna shared an impulse to hit the dresser or the wall. Instead she shared she was able to stop, breathe, and go for a walk to get herself calmed down. Clinician processed that experience and noted the ease and comfort Barbie felt when she made the decision to cope, rather than to lash out.   Plan: Return again in 3 weeks.  Diagnosis: Bipolar 1 disorder, depressed, mild (HCC)  Collaboration of Care: Patient refused AEB none required. Stable mood and behaviors. Medication is doing well.   Patient/Guardian was advised Release of Information must be obtained prior to any record release in order to collaborate their care with an outside provider. Patient/Guardian was advised if they have not already done so to contact the registration department to sign all necessary forms in order for us  to release information regarding their care.   Consent: Patient/Guardian gives verbal consent for treatment and assignment of benefits for services provided during this visit. Patient/Guardian expressed understanding and agreed to proceed.   Harlene JONELLE Villas, LCSW 01/15/2024

## 2024-01-27 NOTE — Telephone Encounter (Signed)
 Spoke with the patient.   [place]PS[place] [employer] medicare/medicaid no auth req   She is scheduled at [facility] for 02/25/2024 at 9 pm.  Mailed packet and sent mychart

## 2024-01-28 ENCOUNTER — Encounter (HOSPITAL_COMMUNITY): Payer: Self-pay

## 2024-01-28 ENCOUNTER — Ambulatory Visit (HOSPITAL_COMMUNITY): Admitting: Licensed Clinical Social Worker

## 2024-02-04 ENCOUNTER — Ambulatory Visit (INDEPENDENT_AMBULATORY_CARE_PROVIDER_SITE_OTHER): Admitting: Family Medicine

## 2024-02-04 ENCOUNTER — Encounter (INDEPENDENT_AMBULATORY_CARE_PROVIDER_SITE_OTHER): Payer: Self-pay | Admitting: Family Medicine

## 2024-02-04 VITALS — BP 91/60 | HR 59 | Temp 98.1°F | Ht 69.0 in | Wt 271.0 lb

## 2024-02-04 DIAGNOSIS — R7303 Prediabetes: Secondary | ICD-10-CM

## 2024-02-04 DIAGNOSIS — E559 Vitamin D deficiency, unspecified: Secondary | ICD-10-CM

## 2024-02-04 DIAGNOSIS — Z6841 Body Mass Index (BMI) 40.0 and over, adult: Secondary | ICD-10-CM

## 2024-02-04 MED ORDER — VITAMIN D (ERGOCALCIFEROL) 1.25 MG (50000 UNIT) PO CAPS: 50000.0000 [IU] | ORAL_CAPSULE | ORAL | 0 refills | Status: AC

## 2024-02-04 NOTE — Progress Notes (Signed)
 "  SUBJECTIVE:  Chief Complaint: Obesity  Interim History: Patient's partner has been dealing with health issues since our last appointment. She has to take her partner to the hospital tomorrow to get her hernia repaired.  She is hopeful this will encourage her partner to possibly get more in line with the meal plan that she follows.  She doesn't feel she has be impacted by her partner's issues.  She is packing her food daily.  She doesn't find it as hard to stay in her calorie budget and get her protein.  Protein yogurt in the am, apple, berries in the yogurt.  Occasionally she can also eat a boiled egg.  She eats a chicken breast, steak and sweet potatoes and broccoli for lunch.  For dinner she is eating a light dinner at 5pm to help alleviate her reflux.  She will have black beans, salsa and guacamole with ground turkey for dinner. She has planned a brunch for Christmas.  Patient anticipates biggest obstacle will be remembering to eat and take care of herself over the next few weeks.  Jordan [name] is here to discuss her progress with her obesity treatment plan. She is on the keeping a food journal and adhering to recommended goals of 1200-1300 calories and 85 grams of protein and states she is following her eating plan approximately 95 % of the time. She states she is exercising 30 minutes 1-2 times per week.   OBJECTIVE: Visit Diagnoses: Problem List Items Addressed This Visit       Other   Prediabetes   Other Visit Diagnoses       Vitamin D  deficiency    -  Primary   Relevant Medications   Vitamin D , Ergocalciferol , (DRISDOL ) 1.25 MG (50000 UNIT) CAPS capsule     BMI 40.0-44.9, adult (HCC)         Morbid obesity (HCC)           Vitals Temp: 98.1 F (36.7 C) BP: 91/60 Pulse Rate: (!) 59 SpO2: 100 %   Anthropometric Measurements Height: 5' 9 (1.753 m) Weight: 271 lb (122.9 kg) BMI (Calculated): 40 Weight at Last Visit: 275 lb Weight Lost Since Last Visit: 4 lb Weight Gained  Since Last Visit: 0 Starting Weight: 280 lb Total Weight Loss (lbs): 9 lb (4.082 kg)   Body Composition  Body Fat %: 46.4 % Fat Mass (lbs): 126.2 lbs Muscle Mass (lbs): 138.2 lbs Total Body Water  (lbs): 93 lbs Visceral Fat Rating : 13   Other Clinical Data Today's Visit #: 5 Starting Date: 11/19/23 Comments: 1200-1300/85     ASSESSMENT AND PLAN: Assessment & Plan Vitamin D  deficiency Patient has tolerated prescription strength vitamin D  and needs a refill of her medication today.  Refill sent to pharmacy.  No nausea, vomiting, or muscle weakness reported.  Will follow-up in 3 months with a repeat vitamin D  level to assess response to treatment. Prediabetes Patient has been working on limiting her simple carbohydrates and has focus more consistently on adequate protein intake per the meal plan.  Will follow-up at next appointment to see if patient was able to be in control over the holidays.  Continue current treatment plan as outlined below for dietary and lifestyle interventions. BMI 40.0-44.9, adult (HCC)  Morbid obesity (HCC)    Diet: Caryssa is currently in the action stage of change. As such, her goal is to continue with weight loss efforts and has agreed to keeping a food journal and adhering to recommended goals of 1100-1200  calories and 85 or more grams of protein.   Exercise:  For substantial health benefits, adults should do at least 150 minutes (2 hours and 30 minutes) a week of moderate-intensity, or 75 minutes (1 hour and 15 minutes) a week of vigorous-intensity aerobic physical activity, or an equivalent combination of moderate- and vigorous-intensity aerobic activity. Aerobic activity should be performed in episodes of at least 10 minutes, and preferably, it should be spread throughout the week.  Behavior Modification:  We discussed the following Behavioral Modification Strategies today: increasing lean protein intake, decreasing simple carbohydrates, meal  planning and cooking strategies, planning for success, and keep a strict food journal.   Return in about 5 weeks (around 03/10/2024) for fasting labs.   She was informed of the importance of frequent follow up visits to maximize her success with intensive lifestyle modifications for her multiple health conditions.  Attestation Statements:   Reviewed by clinician on day of visit: allergies, medications, problem list, medical history, surgical history, family history, social history, and previous encounter notes.   [name], MD "

## 2024-02-05 ENCOUNTER — Other Ambulatory Visit: Payer: Self-pay

## 2024-02-05 DIAGNOSIS — M797 Fibromyalgia: Secondary | ICD-10-CM

## 2024-02-06 MED ORDER — PREGABALIN 150 MG PO CAPS: 150.0000 mg | ORAL_CAPSULE | Freq: Two times a day (BID) | ORAL | 0 refills | Status: DC

## 2024-02-07 ENCOUNTER — Other Ambulatory Visit: Payer: Self-pay

## 2024-02-07 DIAGNOSIS — M797 Fibromyalgia: Secondary | ICD-10-CM

## 2024-02-07 MED ORDER — PREGABALIN 150 MG PO CAPS: 150.0000 mg | ORAL_CAPSULE | Freq: Two times a day (BID) | ORAL | 2 refills | Status: AC

## 2024-02-11 ENCOUNTER — Ambulatory Visit (INDEPENDENT_AMBULATORY_CARE_PROVIDER_SITE_OTHER): Admitting: Licensed Clinical Social Worker

## 2024-02-11 ENCOUNTER — Encounter (HOSPITAL_COMMUNITY): Payer: Self-pay | Admitting: Licensed Clinical Social Worker

## 2024-02-11 DIAGNOSIS — F3131 Bipolar disorder, current episode depressed, mild: Secondary | ICD-10-CM | POA: Diagnosis not present

## 2024-02-11 NOTE — Progress Notes (Signed)
" ° °  THERAPIST PROGRESS NOTE  Session Time: 3:30pm-4:25pm  Participation Level: Active  Behavioral Response: Well GroomedAlertEuthymic and Irritable  Type of Therapy: Individual Therapy  Treatment Goals addressed:  Reduce frequency, intensity, and duration of depression symptoms so that daily functioning is improved     ProgressTowards Goals: Progressing  Interventions: CBT  Summary: Jordan [name] is a 43 y.o. female who presents with Bipolar I, depressed, mild.   Suicidal/Homicidal: Nowithout intent/plan  Therapist Response: Kashay engaged well in in person session with clinician. Clinician utilized CBT to process thoughts, feelings, and interactions. Clinician explored updates in relationship and noted that partner has proposed. Clinician discussed pros and cons. Clinician also identified the challenges of being in a relationship with someone who is just starting her healing journey and has not really established a steady rhythm in therapy. Clinician provided time and space for Erdine to share frustrations and process recent events.   Plan: Return again in 3-4 weeks.  Diagnosis: Bipolar 1 disorder, depressed, mild (HCC)  Collaboration of Care: Patient refused AEB none required  Patient/Guardian was advised Release of Information must be obtained prior to any record release in order to collaborate their care with an outside provider. Patient/Guardian was advised if they have not already done so to contact the registration department to sign all necessary forms in order for us  to release information regarding their care.   Consent: Patient/Guardian gives verbal consent for treatment and assignment of benefits for services provided during this visit. Patient/Guardian expressed understanding and agreed to proceed.   Harlene SAUNDERS Graball, LCSW 02/11/2024  "

## 2024-02-17 NOTE — Assessment & Plan Note (Signed)
 Patient has been working on limiting her simple carbohydrates and has focus more consistently on adequate protein intake per the meal plan.  Will follow-up at next appointment to see if patient was able to be in control over the holidays.  Continue current treatment plan as outlined below for dietary and lifestyle interventions.

## 2024-02-25 ENCOUNTER — Other Ambulatory Visit: Payer: Self-pay

## 2024-02-25 ENCOUNTER — Ambulatory Visit: Admitting: Neurology

## 2024-02-25 DIAGNOSIS — G8929 Other chronic pain: Secondary | ICD-10-CM

## 2024-02-25 DIAGNOSIS — Z1231 Encounter for screening mammogram for malignant neoplasm of breast: Secondary | ICD-10-CM

## 2024-02-25 DIAGNOSIS — G4710 Hypersomnia, unspecified: Secondary | ICD-10-CM

## 2024-02-25 DIAGNOSIS — G8928 Other chronic postprocedural pain: Secondary | ICD-10-CM

## 2024-02-25 DIAGNOSIS — G4719 Other hypersomnia: Secondary | ICD-10-CM

## 2024-02-25 DIAGNOSIS — Z6841 Body Mass Index (BMI) 40.0 and over, adult: Secondary | ICD-10-CM

## 2024-02-25 DIAGNOSIS — R5381 Other malaise: Secondary | ICD-10-CM

## 2024-02-27 ENCOUNTER — Encounter: Payer: Self-pay | Admitting: Podiatry

## 2024-02-27 ENCOUNTER — Ambulatory Visit: Admitting: Podiatry

## 2024-02-27 ENCOUNTER — Ambulatory Visit (INDEPENDENT_AMBULATORY_CARE_PROVIDER_SITE_OTHER)

## 2024-02-27 DIAGNOSIS — M722 Plantar fascial fibromatosis: Secondary | ICD-10-CM

## 2024-02-27 MED ORDER — MELOXICAM 15 MG PO TABS: 15.0000 mg | ORAL_TABLET | Freq: Every day | ORAL | 2 refills | Status: AC

## 2024-02-27 MED ORDER — TRIAMCINOLONE ACETONIDE 10 MG/ML IJ SUSP
10.0000 mg | Freq: Once | INTRAMUSCULAR | Status: AC
  Administered 2024-02-27: 10 mg via INTRA_ARTICULAR

## 2024-02-28 NOTE — Progress Notes (Signed)
 Subjective:   Patient ID: Jordan [name], female   DOB: 44 y.o.   MRN: [id number]   HPI Patient states she has developed a lot of pain around her plantar heel bilateral and she is trying to get active and lose weight at this time neuro   ROS      Objective:  Physical Exam  Vascular status intact with exquisite discomfort around the plantar heel bilateral with flatfoot deformity bilateral     Assessment:  Acute plantar fasciitis bilateral that had been present but not been seen for about a year and 3 months     Plan:  H&P precautionary and x-rays indicating moderate depression of the arch spur formation no indication stress fracture and sterile prep and injected the plantar fascia bilateral at insertion 3 mg Kenalog  5 mg Xylocaine  with sterile dressings applied

## 2024-03-02 ENCOUNTER — Ambulatory Visit
Admission: RE | Admit: 2024-03-02 | Discharge: 2024-03-02 | Disposition: A | Source: Ambulatory Visit | Attending: Plastic Surgery

## 2024-03-02 DIAGNOSIS — Z1231 Encounter for screening mammogram for malignant neoplasm of breast: Secondary | ICD-10-CM

## 2024-03-04 ENCOUNTER — Encounter (HOSPITAL_COMMUNITY): Payer: Self-pay

## 2024-03-04 ENCOUNTER — Ambulatory Visit (HOSPITAL_COMMUNITY): Admitting: Licensed Clinical Social Worker

## 2024-03-06 NOTE — Progress Notes (Signed)
 "  [facility] POLYSOMNOGRAPHY  INTERPRETATION REPORT   STUDY DATE:  02/25/2024     PATIENT NAME:  Jordan [name]         DATE OF BIRTH:  [birth year]  PATIENT ID:  [id number]    TYPE OF STUDY:  PSG  READING PHYSICIAN: DEDRA GORES, MD REFERRED BY: Monika , MD @[facility]   SCORING TECHNICIAN: Jesusa Haddock, RPSGT   HISTORY:  This 44 year-old Female patient was seen on 12-16-23 with a chief complaint of:  insomnia, Chronic Pain, Obesity, Depression. More detail in the PSG Summary. ADDITIONAL INFORMATION:  The Epworth Sleepiness Scale was endorsed  at  11 /24 points (scores above or equal to 10 are suggestive of hypersomnolence). FSS endorsed at 43 /63 points. Depression score : 6 /15 points.   Height: 69 in Weight: 288 lbs (BMI 42) Neck Size: 16 . MEDICATIONS: Vitamin C, Voltaren , Depakote  ER, Flonase , Atarax , Multivitamin-Minerals, Naprosyn , Lyrica , Promethazine -DM   TECHNICAL DESCRIPTION: A registered sleep technologist ( RPSGT)  was in attendance for the duration of the recording.  Data collection, scoring, video monitoring, and reporting were performed in compliance with the AASM Manual for the Scoring of Sleep and Associated Events; (Hypopnea is scored based on the criteria listed in Section VIII D. 1b in the AASM Manual V2.6 using a 4% oxygen desaturation rule or Hypopnea is scored based on the criteria listed in Section VIII D. 1a in the AASM Manual V2.6 using 3% oxygen desaturation and /or arousal rule).   SLEEP CONTINUITY AND SLEEP ARCHITECTURE:  Lights-out was at 21:59: and lights-on at  04:50:, with  6.8 hours of recording time. Total sleep time (TST) was 367.5 minutes with a normal sleep efficiency at 89.4%.     TST was divided between the following sleep positions: In supine for 206 minutes (56%), non-supine 161 minutes (44%); right 68 minutes (19%), left 93 minutes (25%), and prone 00 minutes (0%). Sleep latency was normal  at 19.5 minutes.  REM sleep latency was normal at 85.5 minutes. Of the total sleep time, the percentage of stage N1 sleep was 3.1%, stage N2 sleep was 57%, stage N3 sleep was 25.6%, and REM sleep was 14.0%.  There were 4 Stage R periods observed on this study night, 25 awakenings (i.e. transitions to Stage W from any sleep stage), and 77 total stage transitions.  Wake after sleep onset (WASO) time accounted for 24 minutes.  Total supine REM sleep time was 38 minutes (74% of total REM sleep).   RESPIRATORY MONITORING:   Based on AASM criteria (using a 3% oxygen desaturation and /or arousal rule for scoring hypopneas), there were 0 apneas (0 obstructive; 0 central; 0 mixed), and 23 hypopneas. Apnea index was 0.0. Hypopnea index was 3.8. The AHI (apnea-hypopnea index) was 3.8 overall (6.4 supine, 0.4 non-supine; 10.5 in REM, and 2.66/h was the AHI in NREM).  Total respiratory disturbance index (RDI) was 3.8 /h. RDI results showed: supine RDI 6.4 /h; non-supine RDI 0.4 /h; REM RDI 10.5 /h, supine REM RDI 14.2 /h.  OXIMETRY: The Oxyhemoglobin Saturation Nadir during sleep was at 79% from a mean of 95%.  Total sleep time (TST) in hypoxemia (<89%) was present for 0.3 minutes, or 0.1% of total sleep time.  LIMB MOVEMENTS: There were 0 periodic limb movements of sleep (0.0/h), of which 0 (0.0/h) were associated with an arousal. AROUSAL: There were 66 arousals in total, for an arousal index of 11/hour.  Of these,  13 were identified as respiratory-related arousals (2 /h), 0 were PLM-related arousals (0 /h), and 67 were non-specific arousals (11 /h). EEG:  PSG EEG was of normal amplitude and frequency, with symmetric EEG manifestation of sleep stages. EKG: The electrocardiogram documented NSR, normal sinus rhythm.  The average heart rate during sleep was 63 bpm with heart rate variability between a minimum of 49 bpm and  maximum of  80 bpm. AUDIO and VIDEO: The patient changed sleep positions but showed no  somniloquism or dream enactment, no periodic limb movements. There was only mild snoring which became louder in supine.  PATIENT COMMENTS:   IMPRESSION: 1) No clinically significant degree of Sleep apnea nor Sleep related hypoxia was present.    2) Periodic limb movements were not present. Sleep was not fragmented.  3) Sleep efficiency was normal without any additional sleep aiding medication being taken.  Total sleep time was within normal limits at 367.5 minutes.  Sleep efficiency was normal at 89.4%.       RECOMMENDATIONS: There was no evidence of an organic sleep disorder found.   DEDRA GORES,  MD               "

## 2024-03-06 NOTE — Procedures (Signed)
 "  [facility] POLYSOMNOGRAPHY  INTERPRETATION REPORT   STUDY DATE:  02/25/2024     PATIENT NAME:  Jordan [name]         DATE OF BIRTH:  [birth year]  PATIENT ID:  [id number]    TYPE OF STUDY:  PSG  READING PHYSICIAN: DEDRA GORES, MD REFERRED BY: Monika , MD @[facility]   SCORING TECHNICIAN: Jesusa Haddock, RPSGT   HISTORY:  This 44 year-old Female patient was seen on 12-16-23 with a chief complaint of:  insomnia, Chronic Pain, Obesity, Depression. More detail in the PSG Summary. ADDITIONAL INFORMATION:  The Epworth Sleepiness Scale was endorsed  at  11 /24 points (scores above or equal to 10 are suggestive of hypersomnolence). FSS endorsed at 43 /63 points. Depression score : 6 /15 points.   Height: 69 in Weight: 288 lbs (BMI 42) Neck Size: 16 . MEDICATIONS: Vitamin C, Voltaren , Depakote  ER, Flonase , Atarax , Multivitamin-Minerals, Naprosyn , Lyrica , Promethazine -DM   TECHNICAL DESCRIPTION: A registered sleep technologist ( RPSGT)  was in attendance for the duration of the recording.  Data collection, scoring, video monitoring, and reporting were performed in compliance with the AASM Manual for the Scoring of Sleep and Associated Events; (Hypopnea is scored based on the criteria listed in Section VIII D. 1b in the AASM Manual V2.6 using a 4% oxygen desaturation rule or Hypopnea is scored based on the criteria listed in Section VIII D. 1a in the AASM Manual V2.6 using 3% oxygen desaturation and /or arousal rule).   SLEEP CONTINUITY AND SLEEP ARCHITECTURE:  Lights-out was at 21:59: and lights-on at  04:50:, with  6.8 hours of recording time. Total sleep time (TST) was 367.5 minutes with a normal sleep efficiency at 89.4%.     TST was divided between the following sleep positions: In supine for 206 minutes (56%), non-supine 161 minutes (44%); right 68 minutes (19%), left 93 minutes (25%), and prone 00 minutes (0%). Sleep latency was normal  at 19.5 minutes.  REM sleep latency was normal at 85.5 minutes. Of the total sleep time, the percentage of stage N1 sleep was 3.1%, stage N2 sleep was 57%, stage N3 sleep was 25.6%, and REM sleep was 14.0%.  There were 4 Stage R periods observed on this study night, 25 awakenings (i.e. transitions to Stage W from any sleep stage), and 77 total stage transitions.  Wake after sleep onset (WASO) time accounted for 24 minutes.  Total supine REM sleep time was 38 minutes (74% of total REM sleep).   RESPIRATORY MONITORING:   Based on AASM criteria (using a 3% oxygen desaturation and /or arousal rule for scoring hypopneas), there were 0 apneas (0 obstructive; 0 central; 0 mixed), and 23 hypopneas. Apnea index was 0.0. Hypopnea index was 3.8. The AHI (apnea-hypopnea index) was 3.8 overall (6.4 supine, 0.4 non-supine; 10.5 in REM, and 2.66/h was the AHI in NREM).  Total respiratory disturbance index (RDI) was 3.8 /h. RDI results showed: supine RDI 6.4 /h; non-supine RDI 0.4 /h; REM RDI 10.5 /h, supine REM RDI 14.2 /h.  OXIMETRY: The Oxyhemoglobin Saturation Nadir during sleep was at 79% from a mean of 95%.  Total sleep time (TST) in hypoxemia (<89%) was present for 0.3 minutes, or 0.1% of total sleep time.  LIMB MOVEMENTS: There were 0 periodic limb movements of sleep (0.0/h), of which 0 (0.0/h) were associated with an arousal. AROUSAL: There were 66 arousals in total, for an arousal index of 11/hour.  Of these,  13 were identified as respiratory-related arousals (2 /h), 0 were PLM-related arousals (0 /h), and 67 were non-specific arousals (11 /h). EEG:  PSG EEG was of normal amplitude and frequency, with symmetric EEG manifestation of sleep stages. EKG: The electrocardiogram documented NSR, normal sinus rhythm.  The average heart rate during sleep was 63 bpm with heart rate variability between a minimum of 49 bpm and  maximum of  80 bpm. AUDIO and VIDEO: The patient changed sleep positions but showed no  somniloquism or dream enactment, no periodic limb movements. There was only mild snoring which became louder in supine.  PATIENT COMMENTS:   IMPRESSION: 1) No clinically significant degree of Sleep apnea nor Sleep related hypoxia was present.    2) Periodic limb movements were not present. Sleep was not fragmented.  3) Sleep efficiency was normal without any additional sleep aiding medication being taken.  Total sleep time was within normal limits at 367.5 minutes.  Sleep efficiency was normal at 89.4%.       RECOMMENDATIONS: There was no evidence of an organic sleep disorder found.   DEDRA GORES,  MD               "

## 2024-03-09 ENCOUNTER — Ambulatory Visit: Payer: Self-pay | Admitting: Neurology

## 2024-03-12 NOTE — Telephone Encounter (Signed)
-----   Message from Dedra Gores, MD sent at 03/09/2024 12:37 PM EST ----- ) No clinically significant degree of Sleep apnea nor Sleep related hypoxia was present.    2) Periodic limb movements were not present. Sleep was not fragmented.  3) Sleep efficiency was normal without any additional sleep aiding medication being taken.  Total sleep time was within normal limits at 367.5 minutes.  Sleep efficiency was normal at 89.4%.       RECOMMENDATIONS: There was no evidence of an organic sleep disorder found.

## 2024-03-12 NOTE — Telephone Encounter (Signed)
 [name]

## 2024-03-17 ENCOUNTER — Encounter (HOSPITAL_COMMUNITY): Payer: Self-pay

## 2024-03-17 ENCOUNTER — Ambulatory Visit (INDEPENDENT_AMBULATORY_CARE_PROVIDER_SITE_OTHER): Admitting: Licensed Clinical Social Worker

## 2024-03-17 ENCOUNTER — Ambulatory Visit (INDEPENDENT_AMBULATORY_CARE_PROVIDER_SITE_OTHER): Admitting: Family Medicine

## 2024-03-17 ENCOUNTER — Encounter (INDEPENDENT_AMBULATORY_CARE_PROVIDER_SITE_OTHER): Payer: Self-pay

## 2024-03-17 DIAGNOSIS — F3131 Bipolar disorder, current episode depressed, mild: Secondary | ICD-10-CM | POA: Diagnosis not present

## 2024-03-19 ENCOUNTER — Ambulatory Visit: Admitting: Podiatry

## 2024-03-20 ENCOUNTER — Encounter (INDEPENDENT_AMBULATORY_CARE_PROVIDER_SITE_OTHER): Payer: Self-pay | Admitting: Family Medicine

## 2024-03-20 ENCOUNTER — Encounter: Payer: Self-pay | Admitting: Podiatry

## 2024-03-20 ENCOUNTER — Ambulatory Visit (INDEPENDENT_AMBULATORY_CARE_PROVIDER_SITE_OTHER): Admitting: Podiatry

## 2024-03-20 ENCOUNTER — Ambulatory Visit (INDEPENDENT_AMBULATORY_CARE_PROVIDER_SITE_OTHER): Admitting: Family Medicine

## 2024-03-20 VITALS — BP 91/60 | HR 66 | Temp 98.6°F | Ht 69.0 in | Wt 269.0 lb

## 2024-03-20 DIAGNOSIS — M722 Plantar fascial fibromatosis: Secondary | ICD-10-CM | POA: Diagnosis not present

## 2024-03-20 DIAGNOSIS — R7303 Prediabetes: Secondary | ICD-10-CM

## 2024-03-20 DIAGNOSIS — R29818 Other symptoms and signs involving the nervous system: Secondary | ICD-10-CM

## 2024-03-20 DIAGNOSIS — Z6839 Body mass index (BMI) 39.0-39.9, adult: Secondary | ICD-10-CM

## 2024-03-20 MED ORDER — METFORMIN HCL 500 MG PO TABS: 500.0000 mg | ORAL_TABLET | Freq: Every day | ORAL | 0 refills | Status: AC

## 2024-03-20 NOTE — Progress Notes (Signed)
 Subjective:   Patient ID: Jordan [name], female   DOB: 44 y.o.   MRN: [id number]   HPI Patient states quite a bit improved and not sure how to continue taking medication neuro vas   ROS      Objective:  Physical Exam  Giller status intact significant improvement plantar fascial inflammation bilateral with treatment with flatfoot deformity noted     Assessment:  Acute plantar fasciitis with chronic ulcer nature to condition with patient who responded well to taking Mobic      Plan:  H&P reviewed I have recommended the continuation of Mobic  but we are going to reduce it to every other day and continue stretching exercises ice supportive shoes and discussed other steps if symptoms respond quickly with encouragement on weight loss

## 2024-03-20 NOTE — Progress Notes (Unsigned)
 "  SUBJECTIVE:  Chief Complaint: Obesity  Interim History: Since last appointment patient mentions she was doing well following plan for a period of time but the past two weeks she has had her period and she has been craving chocolate more.  She got a book to journal in but left it in the car unfortunately.  When she was able to be more on plan she was around 1200-1500 calories and around 50-60g of protein on most days and occasionally would get around 90 grams of protein.  She has protein powder to assist with that. 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  Planning to have a follow up with breast surgeon in March so wants to really commit to food journaling until then.  Starting to recognize some patterns like skipping meals in the am then being too hungry and being in less in control.  Jordan [name] is here to discuss her progress with her obesity treatment plan. She is on the keeping a food journal and adhering to recommended goals of 1100-1200 calories and 85 grams of protein and states she is following her eating plan approximately 50 % of the time. She states she is exercising 45 minutes 3 times per week.   OBJECTIVE: Visit Diagnoses: Problem List Items Addressed This Visit       Other   Prediabetes - Primary   Relevant Medications   metFORMIN  (GLUCOPHAGE ) 500 MG tablet   Other Visit Diagnoses       Suspected sleep apnea         BMI 39.0-39.9,adult         Morbid obesity (HCC)       Relevant Medications   metFORMIN  (GLUCOPHAGE ) 500 MG tablet       Vitals Temp: 98.6 F (37 C) BP: 91/60 Pulse Rate: 66 SpO2: 99 %   Anthropometric Measurements Height: 5' 9 (1.753 m) Weight: 269 lb (122 kg) BMI (Calculated): 39.71 Weight at Last Visit: 271 lb Weight Lost Since Last Visit: 2 Weight Gained Since Last Visit: 0 Starting Weight: 280 lb Total Weight Loss (lbs): 11 lb (4.99 kg)   Body Composition  Body Fat %: 47.1 % Fat Mass (lbs): 127 lbs Muscle  Mass (lbs): 135.4 lbs Total Body Water  (lbs): 95 lbs Visceral Fat Rating : 13   Other Clinical Data Fasting: yes Labs: yes Today's Visit #: 6 Starting Date: 11/19/23 Comments: 1100-1200/85     ASSESSMENT AND PLAN: Assessment & Plan Prediabetes  Suspected sleep apnea  BMI 39.0-39.9,adult  Morbid obesity (HCC)    Diet: Jordan [name] is currently in the action stage of change. As such, her goal is to continue with weight loss efforts and has agreed to keeping a food journal and adhering to recommended goals of 1100-1200 calories and 85 or more grams of protein daily.  Patient to start food log or journaling meal plan.  The initial goal will be to habitually log or journal for at least 4 days a week.  The expectation it that patient may not initially meet calorie or protein goals as the nutritional understanding of food intake is begun.  We discussed the 10:1 ratio when reading a food label.  Patient agrees to keep a food log either electronically or on paper and bring to the next appointment to be able to dissect and discuss it with provider.    Exercise:  For substantial health benefits, adults should do at least 150 minutes (2 hours and 30 minutes) a week of  moderate-intensity, or 75 minutes (1 hour and 15 minutes) a week of vigorous-intensity aerobic physical activity, or an equivalent combination of moderate- and vigorous-intensity aerobic activity. Aerobic activity should be performed in episodes of at least 10 minutes, and preferably, it should be spread throughout the week.  Behavior Modification:  We discussed the following Behavioral Modification Strategies today: increasing lean protein intake, decreasing simple carbohydrates, no skipping meals, better snacking choices, planning for success, and keep a strict food journal.   Return in about 4 weeks (around 04/17/2024) for fasting labs.   She was informed of the importance of frequent follow up visits to maximize her success with  intensive lifestyle modifications for her multiple health conditions.  Attestation Statements:   Reviewed by clinician on day of visit: allergies, medications, problem list, medical history, surgical history, family history, social history, and previous encounter notes.     [name], MD "

## 2024-03-21 ENCOUNTER — Encounter (HOSPITAL_COMMUNITY): Payer: Self-pay | Admitting: Licensed Clinical Social Worker

## 2024-03-21 NOTE — Progress Notes (Signed)
 Virtual Visit via Video Note  I connected with Jordan [name] on 03/17/24 at  4:30 PM EST by a video enabled telemedicine application and verified that I am speaking with the correct person using two identifiers.  Location: Patient: home Provider: home office   I discussed the limitations of evaluation and management by telemedicine and the availability of in person appointments. The patient expressed understanding and agreed to proceed.   I discussed the assessment and treatment plan with the patient. The patient was provided an opportunity to ask questions and all were answered. The patient agreed with the plan and demonstrated an understanding of the instructions.   The patient was advised to call back or seek an in-person evaluation if the symptoms worsen or if the condition fails to improve as anticipated.  I provided 45 minutes of non-face-to-face time during this encounter.   Jordan JONELLE Rosser, LCSW   THERAPIST PROGRESS NOTE  Session Time: 4:30pm-5:15pm  Participation Level: Active  Behavioral Response: Well GroomedAlertEuthymic  Type of Therapy: Individual Therapy  Treatment Goals addressed:  Active     OP Depression     LTG: Reduce frequency, intensity, and duration of depression symptoms so that daily functioning is improved (Progressing)     Start:  01/30/22    Expected End:  03/16/25         Work with Jordan [name] to track symptoms, triggers, and/or skill use through a mood chart, diary card, or journal     Start:  01/30/22         Work with Jordan [name] to identify the major components of a recent episode of depression: physical symptoms, major thoughts and images, and major behaviors they experienced     Start:  01/30/22            ProgressTowards Goals: Progressing  Interventions: CBT  Summary: Jordan [name] is a 44 y.o. female who presents with Bipolar I, depressed.   Suicidal/Homicidal: Nowithout intent/plan  Therapist Response: Ia engaged well  in individual virtual session with clinician. Clinician utilized CBT to process thoughts, feelings, and behaviors. Clinician explored updates in relationship, communication, and plans for the coming year. Clinician processed decision making and ability to speak up for what she wants. Clinician explored communication skills with her partner. Jordan [name] shared challenges in her partner's ability to accept criticism without being offended or feelings hurt. Pa also shared that the relationship will continue to be in limbo until real change in their sense of safety and improvement in communication occurs. Clinician processed how Jordan [name] can maintain her calm and positivity regardless of partner's words or actions.  Plan: Return again in 2 weeks.  Diagnosis: Bipolar 1 disorder, depressed, mild (HCC)  Collaboration of Care: Patient refused AEB none required  Patient/Guardian was advised Release of Information must be obtained prior to any record release in order to collaborate their care with an outside provider. Patient/Guardian was advised if they have not already done so to contact the registration department to sign all necessary forms in order for us  to release information regarding their care.   Consent: Patient/Guardian gives verbal consent for treatment and assignment of benefits for services provided during this visit. Patient/Guardian expressed understanding and agreed to proceed.   Jordan JONELLE Wamsutter, LCSW 03/21/2024

## 2024-03-25 ENCOUNTER — Other Ambulatory Visit (HOSPITAL_COMMUNITY): Payer: Self-pay | Admitting: Psychiatry

## 2024-03-25 DIAGNOSIS — F3132 Bipolar disorder, current episode depressed, moderate: Secondary | ICD-10-CM

## 2024-03-30 ENCOUNTER — Telehealth (HOSPITAL_COMMUNITY): Admitting: Psychiatry

## 2024-04-01 ENCOUNTER — Ambulatory Visit (HOSPITAL_COMMUNITY): Admitting: Licensed Clinical Social Worker

## 2024-04-13 ENCOUNTER — Ambulatory Visit (INDEPENDENT_AMBULATORY_CARE_PROVIDER_SITE_OTHER): Admitting: Family Medicine

## 2024-04-14 ENCOUNTER — Ambulatory Visit (HOSPITAL_COMMUNITY): Admitting: Licensed Clinical Social Worker

## 2024-05-14 ENCOUNTER — Ambulatory Visit: Admitting: Plastic Surgery

## 2024-09-02 ENCOUNTER — Ambulatory Visit
